# Patient Record
Sex: Male | Born: 1940
Health system: Southern US, Community
[De-identification: ages and names within clinical notes are randomized; demographics above are authoritative.]

## PROBLEM LIST (undated history)

## (undated) DIAGNOSIS — I10 Essential (primary) hypertension: Secondary | ICD-10-CM

## (undated) DIAGNOSIS — I739 Peripheral vascular disease, unspecified: Secondary | ICD-10-CM

## (undated) DIAGNOSIS — Z87442 Personal history of urinary calculi: Secondary | ICD-10-CM

## (undated) DIAGNOSIS — K648 Other hemorrhoids: Secondary | ICD-10-CM

## (undated) DIAGNOSIS — K219 Gastro-esophageal reflux disease without esophagitis: Secondary | ICD-10-CM

## (undated) DIAGNOSIS — E119 Type 2 diabetes mellitus without complications: Secondary | ICD-10-CM

## (undated) DIAGNOSIS — F1010 Alcohol abuse, uncomplicated: Secondary | ICD-10-CM

## (undated) DIAGNOSIS — E785 Hyperlipidemia, unspecified: Secondary | ICD-10-CM

## (undated) DIAGNOSIS — J449 Chronic obstructive pulmonary disease, unspecified: Secondary | ICD-10-CM

## (undated) DIAGNOSIS — I499 Cardiac arrhythmia, unspecified: Secondary | ICD-10-CM

## (undated) DIAGNOSIS — K449 Diaphragmatic hernia without obstruction or gangrene: Secondary | ICD-10-CM

## (undated) DIAGNOSIS — D518 Other vitamin B12 deficiency anemias: Principal | ICD-10-CM

## (undated) DIAGNOSIS — S32010A Wedge compression fracture of first lumbar vertebra, initial encounter for closed fracture: Secondary | ICD-10-CM

## (undated) DIAGNOSIS — K5730 Diverticulosis of large intestine without perforation or abscess without bleeding: Secondary | ICD-10-CM

## (undated) DIAGNOSIS — M545 Low back pain: Secondary | ICD-10-CM

## (undated) DIAGNOSIS — K922 Gastrointestinal hemorrhage, unspecified: Secondary | ICD-10-CM

## (undated) DIAGNOSIS — R55 Syncope and collapse: Secondary | ICD-10-CM

## (undated) DIAGNOSIS — R296 Repeated falls: Secondary | ICD-10-CM

## (undated) DIAGNOSIS — I6529 Occlusion and stenosis of unspecified carotid artery: Secondary | ICD-10-CM

## (undated) DIAGNOSIS — J189 Pneumonia, unspecified organism: Secondary | ICD-10-CM

## (undated) DIAGNOSIS — N179 Acute kidney failure, unspecified: Secondary | ICD-10-CM

## (undated) DIAGNOSIS — D649 Anemia, unspecified: Secondary | ICD-10-CM

## (undated) DIAGNOSIS — D126 Benign neoplasm of colon, unspecified: Secondary | ICD-10-CM

## (undated) DIAGNOSIS — I252 Old myocardial infarction: Secondary | ICD-10-CM

## (undated) DIAGNOSIS — I2510 Atherosclerotic heart disease of native coronary artery without angina pectoris: Secondary | ICD-10-CM

## (undated) DIAGNOSIS — R918 Other nonspecific abnormal finding of lung field: Secondary | ICD-10-CM

## (undated) DIAGNOSIS — R911 Solitary pulmonary nodule: Secondary | ICD-10-CM

## (undated) DIAGNOSIS — I4892 Unspecified atrial flutter: Secondary | ICD-10-CM

## (undated) DIAGNOSIS — N200 Calculus of kidney: Secondary | ICD-10-CM

## (undated) DIAGNOSIS — M7020 Olecranon bursitis, unspecified elbow: Secondary | ICD-10-CM

## (undated) DIAGNOSIS — J441 Chronic obstructive pulmonary disease with (acute) exacerbation: Secondary | ICD-10-CM

## (undated) DIAGNOSIS — I472 Ventricular tachycardia: Secondary | ICD-10-CM

## (undated) DIAGNOSIS — W19XXXA Unspecified fall, initial encounter: Secondary | ICD-10-CM

## (undated) DIAGNOSIS — M810 Age-related osteoporosis without current pathological fracture: Secondary | ICD-10-CM

## (undated) DIAGNOSIS — I4729 Other ventricular tachycardia: Secondary | ICD-10-CM

## (undated) DIAGNOSIS — I5032 Chronic diastolic (congestive) heart failure: Secondary | ICD-10-CM

## (undated) DIAGNOSIS — I480 Paroxysmal atrial fibrillation: Secondary | ICD-10-CM

## (undated) DIAGNOSIS — M1990 Unspecified osteoarthritis, unspecified site: Secondary | ICD-10-CM

## (undated) DIAGNOSIS — J9610 Chronic respiratory failure, unspecified whether with hypoxia or hypercapnia: Secondary | ICD-10-CM

## (undated) HISTORY — DX: Gastro-esophageal reflux disease without esophagitis: K21.9

## (undated) HISTORY — DX: Diaphragmatic hernia without obstruction or gangrene: K44.9

## (undated) HISTORY — DX: Type 2 diabetes mellitus without complications: E11.9

## (undated) HISTORY — DX: Occlusion and stenosis of unspecified carotid artery: I65.29

## (undated) HISTORY — DX: Hyperlipidemia, unspecified: E78.5

## (undated) HISTORY — DX: Chronic obstructive pulmonary disease, unspecified: J44.9

## (undated) HISTORY — PX: HEMORRHOID SURGERY: SHX153

## (undated) HISTORY — DX: Alcohol abuse, uncomplicated: F10.10

## (undated) HISTORY — DX: Anemia, unspecified: D64.9

## (undated) HISTORY — DX: Diverticulosis of large intestine without perforation or abscess without bleeding: K57.30

## (undated) HISTORY — DX: Chronic respiratory failure, unspecified whether with hypoxia or hypercapnia: J96.10

## (undated) HISTORY — DX: Paroxysmal atrial fibrillation: I48.0

## (undated) HISTORY — DX: Atherosclerotic heart disease of native coronary artery without angina pectoris: I25.10

## (undated) HISTORY — DX: Gastrointestinal hemorrhage, unspecified: K92.2

## (undated) HISTORY — PX: PTCA: SHX146

## (undated) HISTORY — DX: Repeated falls: R29.6

## (undated) HISTORY — DX: Peripheral vascular disease, unspecified: I73.9

## (undated) HISTORY — PX: THYROIDECTOMY: SHX17

## (undated) HISTORY — DX: Other hemorrhoids: K64.8

## (undated) HISTORY — DX: Calculus of kidney: N20.0

## (undated) HISTORY — PX: APPENDECTOMY: SHX54

## (undated) HISTORY — DX: Chronic diastolic (congestive) heart failure: I50.32

## (undated) HISTORY — DX: Solitary pulmonary nodule: R91.1

## (undated) HISTORY — PX: CHOLECYSTECTOMY: SHX55

## (undated) HISTORY — DX: Ventricular tachycardia: I47.2

## (undated) HISTORY — DX: Age-related osteoporosis without current pathological fracture: M81.0

## (undated) HISTORY — DX: Unspecified fall, initial encounter: W19.XXXA

## (undated) HISTORY — DX: Essential (primary) hypertension: I10

## (undated) HISTORY — DX: Unspecified atrial flutter: I48.92

## (undated) HISTORY — DX: Other ventricular tachycardia: I47.29

## (undated) HISTORY — PX: CORONARY ARTERY BYPASS GRAFT: SHX141

## (undated) HISTORY — DX: Old myocardial infarction: I25.2

## (undated) HISTORY — DX: Benign neoplasm of colon, unspecified: D12.6

## (undated) HISTORY — DX: Other vitamin B12 deficiency anemias: D51.8

## (undated) HISTORY — PX: CORONARY STENT PLACEMENT: SHX1402

---

## 1991-05-31 ENCOUNTER — Encounter: Payer: Self-pay | Admitting: Internal Medicine

## 1997-12-18 ENCOUNTER — Encounter: Payer: Self-pay | Admitting: Emergency Medicine

## 1997-12-19 ENCOUNTER — Inpatient Hospital Stay (HOSPITAL_COMMUNITY): Admission: EM | Admit: 1997-12-19 | Discharge: 1997-12-19 | Payer: Self-pay | Admitting: Emergency Medicine

## 1997-12-19 ENCOUNTER — Encounter: Payer: Self-pay | Admitting: Cardiovascular Disease

## 1998-10-07 ENCOUNTER — Ambulatory Visit (HOSPITAL_COMMUNITY): Admission: RE | Admit: 1998-10-07 | Discharge: 1998-10-08 | Payer: Self-pay | Admitting: Cardiology

## 1999-01-19 ENCOUNTER — Ambulatory Visit (HOSPITAL_COMMUNITY): Admission: RE | Admit: 1999-01-19 | Discharge: 1999-01-19 | Payer: Self-pay | Admitting: Internal Medicine

## 1999-01-19 ENCOUNTER — Encounter: Payer: Self-pay | Admitting: Internal Medicine

## 1999-01-22 ENCOUNTER — Encounter: Payer: Self-pay | Admitting: Emergency Medicine

## 1999-01-22 ENCOUNTER — Emergency Department (HOSPITAL_COMMUNITY): Admission: EM | Admit: 1999-01-22 | Discharge: 1999-01-22 | Payer: Self-pay | Admitting: Emergency Medicine

## 1999-01-27 ENCOUNTER — Encounter: Payer: Self-pay | Admitting: Internal Medicine

## 1999-01-27 ENCOUNTER — Inpatient Hospital Stay (HOSPITAL_COMMUNITY): Admission: EM | Admit: 1999-01-27 | Discharge: 1999-01-29 | Payer: Self-pay | Admitting: Emergency Medicine

## 1999-01-27 ENCOUNTER — Encounter: Payer: Self-pay | Admitting: Emergency Medicine

## 1999-01-27 ENCOUNTER — Encounter: Admission: RE | Admit: 1999-01-27 | Discharge: 1999-01-27 | Payer: Self-pay | Admitting: Internal Medicine

## 1999-05-28 ENCOUNTER — Ambulatory Visit (HOSPITAL_COMMUNITY): Admission: RE | Admit: 1999-05-28 | Discharge: 1999-05-28 | Payer: Self-pay | Admitting: Cardiology

## 2000-04-05 ENCOUNTER — Encounter: Payer: Self-pay | Admitting: Cardiology

## 2000-04-05 ENCOUNTER — Inpatient Hospital Stay (HOSPITAL_COMMUNITY): Admission: EM | Admit: 2000-04-05 | Discharge: 2000-04-08 | Payer: Self-pay | Admitting: Emergency Medicine

## 2000-04-25 ENCOUNTER — Encounter: Payer: Self-pay | Admitting: General Surgery

## 2000-04-25 ENCOUNTER — Ambulatory Visit (HOSPITAL_COMMUNITY): Admission: RE | Admit: 2000-04-25 | Discharge: 2000-04-25 | Payer: Self-pay | Admitting: General Surgery

## 2000-11-15 ENCOUNTER — Encounter: Payer: Self-pay | Admitting: Emergency Medicine

## 2000-11-15 ENCOUNTER — Inpatient Hospital Stay (HOSPITAL_COMMUNITY): Admission: EM | Admit: 2000-11-15 | Discharge: 2000-11-17 | Payer: Self-pay | Admitting: Emergency Medicine

## 2000-11-15 ENCOUNTER — Encounter (INDEPENDENT_AMBULATORY_CARE_PROVIDER_SITE_OTHER): Payer: Self-pay | Admitting: *Deleted

## 2001-03-06 ENCOUNTER — Encounter: Payer: Self-pay | Admitting: *Deleted

## 2001-03-06 ENCOUNTER — Inpatient Hospital Stay (HOSPITAL_COMMUNITY): Admission: EM | Admit: 2001-03-06 | Discharge: 2001-03-09 | Payer: Self-pay | Admitting: *Deleted

## 2002-03-28 ENCOUNTER — Encounter: Payer: Self-pay | Admitting: Internal Medicine

## 2002-03-28 LAB — CONVERTED CEMR LAB

## 2002-05-29 ENCOUNTER — Encounter: Admission: RE | Admit: 2002-05-29 | Discharge: 2002-05-29 | Payer: Self-pay | Admitting: Internal Medicine

## 2002-05-29 ENCOUNTER — Encounter: Payer: Self-pay | Admitting: Internal Medicine

## 2002-12-24 ENCOUNTER — Encounter: Payer: Self-pay | Admitting: Cardiology

## 2002-12-24 ENCOUNTER — Inpatient Hospital Stay (HOSPITAL_COMMUNITY): Admission: EM | Admit: 2002-12-24 | Discharge: 2002-12-26 | Payer: Self-pay | Admitting: *Deleted

## 2002-12-26 ENCOUNTER — Encounter: Payer: Self-pay | Admitting: Cardiology

## 2003-12-09 ENCOUNTER — Encounter: Payer: Self-pay | Admitting: Internal Medicine

## 2004-02-23 ENCOUNTER — Ambulatory Visit: Payer: Self-pay | Admitting: Internal Medicine

## 2004-03-31 ENCOUNTER — Ambulatory Visit: Payer: Self-pay | Admitting: Internal Medicine

## 2004-05-12 ENCOUNTER — Inpatient Hospital Stay (HOSPITAL_COMMUNITY): Admission: EM | Admit: 2004-05-12 | Discharge: 2004-05-13 | Payer: Self-pay | Admitting: Emergency Medicine

## 2004-05-12 ENCOUNTER — Ambulatory Visit: Payer: Self-pay | Admitting: Cardiology

## 2004-06-02 ENCOUNTER — Ambulatory Visit: Payer: Self-pay | Admitting: Cardiology

## 2004-06-23 ENCOUNTER — Ambulatory Visit: Payer: Self-pay | Admitting: Internal Medicine

## 2004-08-02 ENCOUNTER — Ambulatory Visit: Payer: Self-pay | Admitting: Internal Medicine

## 2004-10-18 ENCOUNTER — Ambulatory Visit: Payer: Self-pay | Admitting: Internal Medicine

## 2004-10-20 ENCOUNTER — Ambulatory Visit: Payer: Self-pay | Admitting: Internal Medicine

## 2005-01-17 ENCOUNTER — Ambulatory Visit: Payer: Self-pay | Admitting: Internal Medicine

## 2005-01-26 ENCOUNTER — Ambulatory Visit: Payer: Self-pay

## 2005-04-08 ENCOUNTER — Ambulatory Visit: Payer: Self-pay | Admitting: Internal Medicine

## 2005-04-15 ENCOUNTER — Ambulatory Visit: Payer: Self-pay | Admitting: Internal Medicine

## 2005-05-12 ENCOUNTER — Ambulatory Visit: Payer: Self-pay | Admitting: Internal Medicine

## 2005-09-19 ENCOUNTER — Encounter: Admission: RE | Admit: 2005-09-19 | Discharge: 2005-09-19 | Payer: Self-pay | Admitting: Internal Medicine

## 2005-09-19 ENCOUNTER — Ambulatory Visit: Payer: Self-pay | Admitting: Internal Medicine

## 2005-12-06 ENCOUNTER — Ambulatory Visit: Payer: Self-pay | Admitting: Internal Medicine

## 2005-12-14 ENCOUNTER — Encounter: Admission: RE | Admit: 2005-12-14 | Discharge: 2005-12-14 | Payer: Self-pay | Admitting: Internal Medicine

## 2005-12-21 ENCOUNTER — Ambulatory Visit: Payer: Self-pay | Admitting: Internal Medicine

## 2006-01-04 ENCOUNTER — Encounter: Admission: RE | Admit: 2006-01-04 | Discharge: 2006-01-30 | Payer: Self-pay | Admitting: Internal Medicine

## 2006-01-16 ENCOUNTER — Ambulatory Visit: Payer: Self-pay | Admitting: Internal Medicine

## 2006-01-18 ENCOUNTER — Ambulatory Visit: Payer: Self-pay | Admitting: Internal Medicine

## 2006-02-15 ENCOUNTER — Ambulatory Visit: Payer: Self-pay

## 2006-02-23 ENCOUNTER — Ambulatory Visit: Payer: Self-pay | Admitting: Cardiology

## 2006-04-03 ENCOUNTER — Ambulatory Visit: Payer: Self-pay | Admitting: Internal Medicine

## 2006-11-21 ENCOUNTER — Encounter: Payer: Self-pay | Admitting: Internal Medicine

## 2006-11-21 DIAGNOSIS — Z951 Presence of aortocoronary bypass graft: Secondary | ICD-10-CM

## 2006-11-21 DIAGNOSIS — M810 Age-related osteoporosis without current pathological fracture: Secondary | ICD-10-CM

## 2006-11-21 DIAGNOSIS — E785 Hyperlipidemia, unspecified: Secondary | ICD-10-CM | POA: Insufficient documentation

## 2006-11-21 DIAGNOSIS — I252 Old myocardial infarction: Secondary | ICD-10-CM

## 2006-11-21 DIAGNOSIS — I10 Essential (primary) hypertension: Secondary | ICD-10-CM | POA: Insufficient documentation

## 2006-11-21 HISTORY — DX: Old myocardial infarction: I25.2

## 2006-11-21 HISTORY — DX: Hyperlipidemia, unspecified: E78.5

## 2006-11-21 HISTORY — DX: Age-related osteoporosis without current pathological fracture: M81.0

## 2006-12-18 ENCOUNTER — Ambulatory Visit: Payer: Self-pay | Admitting: Internal Medicine

## 2006-12-19 ENCOUNTER — Ambulatory Visit: Payer: Self-pay | Admitting: Internal Medicine

## 2006-12-19 ENCOUNTER — Encounter: Payer: Self-pay | Admitting: Internal Medicine

## 2006-12-20 LAB — CONVERTED CEMR LAB
ALT: 14 units/L (ref 0–53)
AST: 21 units/L (ref 0–37)
Albumin: 4 g/dL (ref 3.5–5.2)
Alkaline Phosphatase: 46 units/L (ref 39–117)
BUN: 8 mg/dL (ref 6–23)
Basophils Absolute: 0 10*3/uL (ref 0.0–0.1)
Basophils Relative: 0.2 % (ref 0.0–1.0)
Bilirubin, Direct: 0.2 mg/dL (ref 0.0–0.3)
CO2: 33 meq/L — ABNORMAL HIGH (ref 19–32)
Calcium: 9.2 mg/dL (ref 8.4–10.5)
Chloride: 104 meq/L (ref 96–112)
Cholesterol: 147 mg/dL (ref 0–200)
Creatinine, Ser: 0.8 mg/dL (ref 0.4–1.5)
Eosinophils Absolute: 0.2 10*3/uL (ref 0.0–0.6)
Eosinophils Relative: 2.3 % (ref 0.0–5.0)
Ferritin: 10 ng/mL — ABNORMAL LOW (ref 22.0–322.0)
GFR calc Af Amer: 124 mL/min
GFR calc non Af Amer: 103 mL/min
Glucose, Bld: 107 mg/dL — ABNORMAL HIGH (ref 70–99)
HCT: 36 % — ABNORMAL LOW (ref 39.0–52.0)
HDL: 45.6 mg/dL (ref >39.0)
Hemoglobin: 11.9 g/dL — ABNORMAL LOW (ref 13.0–17.0)
Iron: 81 ug/dL (ref 42–165)
LDL Cholesterol: 79 mg/dL (ref 0–99)
Lymphocytes Relative: 16.1 % (ref 12.0–46.0)
MCHC: 33 g/dL (ref 30.0–36.0)
MCV: 84.6 fL (ref 78.0–100.0)
Monocytes Absolute: 1.2 10*3/uL — ABNORMAL HIGH (ref 0.2–0.7)
Monocytes Relative: 15.3 % — ABNORMAL HIGH (ref 3.0–11.0)
Neutro Abs: 5 10*3/uL (ref 1.4–7.7)
Neutrophils Relative %: 66.1 % (ref 43.0–77.0)
Platelets: 258 10*3/uL (ref 150–400)
Potassium: 4 meq/L (ref 3.5–5.1)
RBC: 4.26 M/uL (ref 4.22–5.81)
RDW: 14.8 % — ABNORMAL HIGH (ref 11.5–14.6)
Saturation Ratios: 14.1 % — ABNORMAL LOW (ref 20.0–50.0)
Sodium: 144 meq/L (ref 135–145)
TSH: 2.15 microintl units/mL (ref 0.35–5.50)
Total Bilirubin: 1.2 mg/dL (ref 0.3–1.2)
Total CHOL/HDL Ratio: 3.2
Total Protein: 7 g/dL (ref 6.0–8.3)
Transferrin: 410.2 mg/dL — ABNORMAL HIGH (ref >=212.0)
Triglycerides: 113 mg/dL (ref 0–149)
VLDL: 23 mg/dL (ref 0–40)
Vitamin B-12: 238 pg/mL (ref 211–911)
WBC: 7.6 10*3/uL (ref 4.5–10.5)

## 2006-12-27 ENCOUNTER — Encounter: Payer: Self-pay | Admitting: Internal Medicine

## 2006-12-27 ENCOUNTER — Ambulatory Visit: Payer: Self-pay

## 2006-12-29 ENCOUNTER — Telehealth: Payer: Self-pay | Admitting: Internal Medicine

## 2007-01-03 ENCOUNTER — Ambulatory Visit: Payer: Self-pay | Admitting: Internal Medicine

## 2007-01-15 ENCOUNTER — Ambulatory Visit: Payer: Self-pay | Admitting: Cardiovascular Disease

## 2007-01-15 LAB — CONVERTED CEMR LAB
BUN: 10 mg/dL (ref 6–23)
Calcium: 9.7 mg/dL (ref 8.4–10.5)
Eosinophils Absolute: 0.2 10*3/uL (ref 0.0–0.6)
Eosinophils Relative: 3.4 % (ref 0.0–5.0)
GFR calc Af Amer: 124 mL/min
GFR calc non Af Amer: 103 mL/min
Glucose, Bld: 97 mg/dL (ref 70–99)
Hemoglobin: 11.8 g/dL — ABNORMAL LOW (ref 13.0–17.0)
Lymphocytes Relative: 23.4 % (ref 12.0–46.0)
MCV: 82.9 fL (ref 78.0–100.0)
Monocytes Absolute: 0.7 10*3/uL (ref 0.2–0.7)
Monocytes Relative: 11.6 % — ABNORMAL HIGH (ref 3.0–11.0)
Neutro Abs: 3.4 10*3/uL (ref 1.4–7.7)
Platelets: 256 10*3/uL (ref 150–400)
Potassium: 4.4 meq/L (ref 3.5–5.1)

## 2007-01-17 ENCOUNTER — Ambulatory Visit (HOSPITAL_COMMUNITY): Admission: RE | Admit: 2007-01-17 | Discharge: 2007-01-17 | Payer: Self-pay | Admitting: Cardiovascular Disease

## 2007-01-19 ENCOUNTER — Ambulatory Visit: Payer: Self-pay | Admitting: Internal Medicine

## 2007-01-19 DIAGNOSIS — F17200 Nicotine dependence, unspecified, uncomplicated: Secondary | ICD-10-CM

## 2007-01-30 ENCOUNTER — Ambulatory Visit: Payer: Self-pay | Admitting: Internal Medicine

## 2007-01-31 ENCOUNTER — Encounter: Payer: Self-pay | Admitting: Internal Medicine

## 2007-01-31 ENCOUNTER — Ambulatory Visit: Payer: Self-pay | Admitting: Cardiovascular Disease

## 2007-01-31 ENCOUNTER — Ambulatory Visit: Payer: Self-pay

## 2007-02-23 ENCOUNTER — Ambulatory Visit: Payer: Self-pay | Admitting: Internal Medicine

## 2007-04-13 ENCOUNTER — Ambulatory Visit: Payer: Self-pay | Admitting: Cardiology

## 2007-04-17 ENCOUNTER — Ambulatory Visit (HOSPITAL_COMMUNITY): Admission: RE | Admit: 2007-04-17 | Discharge: 2007-04-17 | Payer: Self-pay | Admitting: Cardiovascular Disease

## 2007-04-17 ENCOUNTER — Ambulatory Visit: Payer: Self-pay | Admitting: Cardiovascular Disease

## 2007-04-30 ENCOUNTER — Ambulatory Visit: Payer: Self-pay

## 2007-05-11 ENCOUNTER — Ambulatory Visit: Payer: Self-pay | Admitting: Internal Medicine

## 2007-05-21 DIAGNOSIS — K5730 Diverticulosis of large intestine without perforation or abscess without bleeding: Secondary | ICD-10-CM

## 2007-05-21 DIAGNOSIS — K449 Diaphragmatic hernia without obstruction or gangrene: Secondary | ICD-10-CM | POA: Insufficient documentation

## 2007-05-21 DIAGNOSIS — D126 Benign neoplasm of colon, unspecified: Secondary | ICD-10-CM

## 2007-05-21 DIAGNOSIS — N200 Calculus of kidney: Secondary | ICD-10-CM

## 2007-05-21 HISTORY — DX: Diaphragmatic hernia without obstruction or gangrene: K44.9

## 2007-05-21 HISTORY — DX: Diverticulosis of large intestine without perforation or abscess without bleeding: K57.30

## 2007-05-21 HISTORY — DX: Calculus of kidney: N20.0

## 2007-05-21 HISTORY — DX: Benign neoplasm of colon, unspecified: D12.6

## 2007-05-24 ENCOUNTER — Ambulatory Visit: Payer: Self-pay | Admitting: Cardiovascular Disease

## 2007-05-31 ENCOUNTER — Ambulatory Visit: Payer: Self-pay | Admitting: Internal Medicine

## 2007-06-28 ENCOUNTER — Ambulatory Visit: Payer: Self-pay | Admitting: Internal Medicine

## 2007-06-28 ENCOUNTER — Inpatient Hospital Stay (HOSPITAL_COMMUNITY): Admission: AD | Admit: 2007-06-28 | Discharge: 2007-06-29 | Payer: Self-pay | Admitting: Internal Medicine

## 2007-06-28 ENCOUNTER — Ambulatory Visit: Payer: Self-pay | Admitting: Cardiology

## 2007-06-29 ENCOUNTER — Telehealth: Payer: Self-pay | Admitting: Internal Medicine

## 2007-11-04 ENCOUNTER — Emergency Department (HOSPITAL_COMMUNITY): Admission: EM | Admit: 2007-11-04 | Discharge: 2007-11-04 | Payer: Self-pay | Admitting: Emergency Medicine

## 2007-11-04 ENCOUNTER — Ambulatory Visit: Payer: Self-pay | Admitting: Cardiology

## 2008-01-07 ENCOUNTER — Ambulatory Visit: Payer: Self-pay | Admitting: Internal Medicine

## 2008-01-08 ENCOUNTER — Ambulatory Visit: Payer: Self-pay

## 2008-01-08 ENCOUNTER — Ambulatory Visit: Payer: Self-pay | Admitting: Cardiovascular Disease

## 2008-01-17 ENCOUNTER — Ambulatory Visit: Payer: Self-pay

## 2008-02-04 ENCOUNTER — Ambulatory Visit: Payer: Self-pay | Admitting: Internal Medicine

## 2008-02-04 LAB — CONVERTED CEMR LAB
Blood in Urine, dipstick: NEGATIVE
Glucose, Urine, Semiquant: NEGATIVE
Nitrite: NEGATIVE
Protein, U semiquant: NEGATIVE
WBC Urine, dipstick: NEGATIVE
pH: 7.5

## 2008-02-05 LAB — CONVERTED CEMR LAB
Alkaline Phosphatase: 37 units/L — ABNORMAL LOW (ref 39–117)
Bilirubin, Direct: 0.1 mg/dL (ref 0.0–0.3)
Calcium: 9.2 mg/dL (ref 8.4–10.5)
GFR calc Af Amer: 124 mL/min
GFR calc non Af Amer: 102 mL/min
HDL: 36.7 mg/dL — ABNORMAL LOW (ref >39.0)
LDL Cholesterol: 69 mg/dL (ref 0–99)
Potassium: 4 meq/L (ref 3.5–5.1)
Sodium: 140 meq/L (ref 135–145)
Total Bilirubin: 0.9 mg/dL (ref 0.3–1.2)
Total CHOL/HDL Ratio: 3.5
Total Protein: 6.9 g/dL (ref 6.0–8.3)
VLDL: 21 mg/dL (ref 0–40)

## 2008-04-04 ENCOUNTER — Ambulatory Visit: Payer: Self-pay | Admitting: Cardiology

## 2008-04-15 ENCOUNTER — Ambulatory Visit (HOSPITAL_COMMUNITY): Admission: RE | Admit: 2008-04-15 | Discharge: 2008-04-15 | Payer: Self-pay | Admitting: Cardiology

## 2008-04-15 ENCOUNTER — Ambulatory Visit: Payer: Self-pay | Admitting: Cardiology

## 2008-07-23 ENCOUNTER — Ambulatory Visit: Payer: Self-pay | Admitting: Internal Medicine

## 2008-07-24 LAB — CONVERTED CEMR LAB
ALT: 11 units/L (ref 0–53)
AST: 17 units/L (ref 0–37)
BUN: 7 mg/dL (ref 6–23)
Basophils Relative: 1 % (ref 0.0–3.0)
CO2: 32 meq/L (ref 19–32)
Chloride: 105 meq/L (ref 96–112)
Cholesterol: 142 mg/dL (ref 0–200)
Creatinine, Ser: 0.7 mg/dL (ref 0.4–1.5)
HCT: 38 % — ABNORMAL LOW (ref 39.0–52.0)
Hemoglobin: 12.7 g/dL — ABNORMAL LOW (ref 13.0–17.0)
LDL Cholesterol: 70 mg/dL (ref 0–99)
Lymphocytes Relative: 24.6 % (ref 12.0–46.0)
Lymphs Abs: 0.9 10*3/uL (ref 0.7–4.0)
MCHC: 33.3 g/dL (ref 30.0–36.0)
Monocytes Relative: 14.4 % — ABNORMAL HIGH (ref 3.0–12.0)
Neutro Abs: 2.1 10*3/uL (ref 1.4–7.7)
Potassium: 3.6 meq/L (ref 3.5–5.1)
RBC: 4.41 M/uL (ref 4.22–5.81)
Total Bilirubin: 1 mg/dL (ref 0.3–1.2)
Total CHOL/HDL Ratio: 3
Total Protein: 6.9 g/dL (ref 6.0–8.3)
Triglycerides: 149 mg/dL (ref 0.0–149.0)

## 2008-07-30 ENCOUNTER — Ambulatory Visit: Payer: Self-pay | Admitting: Internal Medicine

## 2008-07-31 ENCOUNTER — Encounter: Payer: Self-pay | Admitting: Internal Medicine

## 2008-07-31 LAB — CONVERTED CEMR LAB
Ferritin: 11.4 ng/mL — ABNORMAL LOW (ref 22.0–322.0)
Iron: 43 ug/dL (ref 42–165)
Tissue Transglutaminase Ab, IgA: 0.2 units (ref <7)
Transferrin: 385.2 mg/dL — ABNORMAL HIGH (ref 212.0–360.0)

## 2008-08-05 ENCOUNTER — Encounter: Payer: Self-pay | Admitting: Internal Medicine

## 2008-08-06 ENCOUNTER — Encounter: Payer: Self-pay | Admitting: Cardiology

## 2008-08-06 ENCOUNTER — Encounter: Payer: Self-pay | Admitting: Internal Medicine

## 2008-08-07 ENCOUNTER — Telehealth (INDEPENDENT_AMBULATORY_CARE_PROVIDER_SITE_OTHER): Payer: Self-pay | Admitting: *Deleted

## 2008-08-07 ENCOUNTER — Ambulatory Visit: Payer: Self-pay | Admitting: Internal Medicine

## 2008-08-11 ENCOUNTER — Telehealth (INDEPENDENT_AMBULATORY_CARE_PROVIDER_SITE_OTHER): Payer: Self-pay

## 2008-08-12 ENCOUNTER — Ambulatory Visit: Payer: Self-pay

## 2008-08-12 ENCOUNTER — Ambulatory Visit: Payer: Self-pay | Admitting: Cardiology

## 2008-08-14 ENCOUNTER — Ambulatory Visit: Payer: Self-pay | Admitting: Internal Medicine

## 2008-08-20 ENCOUNTER — Ambulatory Visit: Payer: Self-pay | Admitting: Internal Medicine

## 2008-08-21 ENCOUNTER — Ambulatory Visit: Payer: Self-pay | Admitting: Internal Medicine

## 2008-08-28 ENCOUNTER — Ambulatory Visit: Payer: Self-pay | Admitting: Internal Medicine

## 2008-08-29 ENCOUNTER — Encounter: Payer: Self-pay | Admitting: Internal Medicine

## 2008-08-29 ENCOUNTER — Emergency Department (HOSPITAL_COMMUNITY): Admission: EM | Admit: 2008-08-29 | Discharge: 2008-08-29 | Payer: Self-pay | Admitting: Emergency Medicine

## 2008-08-29 ENCOUNTER — Ambulatory Visit: Payer: Self-pay | Admitting: Internal Medicine

## 2008-09-02 ENCOUNTER — Encounter: Payer: Self-pay | Admitting: Internal Medicine

## 2008-09-03 ENCOUNTER — Telehealth: Payer: Self-pay | Admitting: Internal Medicine

## 2008-09-04 ENCOUNTER — Ambulatory Visit: Payer: Self-pay | Admitting: Internal Medicine

## 2008-09-06 ENCOUNTER — Ambulatory Visit: Payer: Self-pay | Admitting: Internal Medicine

## 2008-09-06 ENCOUNTER — Inpatient Hospital Stay (HOSPITAL_COMMUNITY): Admission: EM | Admit: 2008-09-06 | Discharge: 2008-09-07 | Payer: Self-pay | Admitting: Emergency Medicine

## 2008-09-09 ENCOUNTER — Ambulatory Visit: Payer: Self-pay | Admitting: Internal Medicine

## 2008-09-09 DIAGNOSIS — K922 Gastrointestinal hemorrhage, unspecified: Secondary | ICD-10-CM | POA: Insufficient documentation

## 2008-09-09 HISTORY — DX: Gastrointestinal hemorrhage, unspecified: K92.2

## 2008-09-10 ENCOUNTER — Telehealth: Payer: Self-pay | Admitting: Internal Medicine

## 2008-09-10 LAB — CONVERTED CEMR LAB
Basophils Absolute: 0 10*3/uL (ref 0.0–0.1)
Lymphocytes Relative: 20.2 % (ref 12.0–46.0)
Lymphs Abs: 1.1 10*3/uL (ref 0.7–4.0)
Monocytes Relative: 11.7 % (ref 3.0–12.0)
Neutrophils Relative %: 64.1 % (ref 43.0–77.0)
Platelets: 217 10*3/uL (ref 150.0–400.0)
RDW: 15.7 % — ABNORMAL HIGH (ref 11.5–14.6)
WBC: 5.2 10*3/uL (ref 4.5–10.5)

## 2008-09-30 ENCOUNTER — Ambulatory Visit: Payer: Self-pay | Admitting: Cardiology

## 2008-10-02 ENCOUNTER — Ambulatory Visit: Payer: Self-pay | Admitting: Internal Medicine

## 2008-10-10 ENCOUNTER — Ambulatory Visit (HOSPITAL_COMMUNITY): Admission: RE | Admit: 2008-10-10 | Discharge: 2008-10-11 | Payer: Self-pay | Admitting: Surgery

## 2008-10-10 HISTORY — PX: INGUINAL HERNIA REPAIR: SHX194

## 2008-10-21 ENCOUNTER — Ambulatory Visit: Payer: Self-pay | Admitting: Internal Medicine

## 2008-10-22 ENCOUNTER — Encounter: Payer: Self-pay | Admitting: Cardiology

## 2008-10-22 ENCOUNTER — Encounter: Payer: Self-pay | Admitting: Internal Medicine

## 2008-10-31 ENCOUNTER — Ambulatory Visit: Payer: Self-pay | Admitting: Internal Medicine

## 2008-12-02 ENCOUNTER — Ambulatory Visit: Payer: Self-pay | Admitting: Internal Medicine

## 2008-12-02 DIAGNOSIS — D518 Other vitamin B12 deficiency anemias: Secondary | ICD-10-CM

## 2008-12-02 HISTORY — DX: Other vitamin B12 deficiency anemias: D51.8

## 2008-12-17 ENCOUNTER — Ambulatory Visit: Payer: Self-pay | Admitting: Internal Medicine

## 2009-01-01 ENCOUNTER — Ambulatory Visit: Payer: Self-pay | Admitting: Internal Medicine

## 2009-01-23 ENCOUNTER — Ambulatory Visit: Payer: Self-pay | Admitting: Family Medicine

## 2009-01-23 ENCOUNTER — Encounter: Payer: Self-pay | Admitting: Internal Medicine

## 2009-01-27 ENCOUNTER — Ambulatory Visit: Payer: Self-pay | Admitting: Family Medicine

## 2009-02-02 ENCOUNTER — Ambulatory Visit: Payer: Self-pay | Admitting: Internal Medicine

## 2009-02-11 ENCOUNTER — Ambulatory Visit: Payer: Self-pay | Admitting: Internal Medicine

## 2009-02-12 ENCOUNTER — Telehealth: Payer: Self-pay | Admitting: Internal Medicine

## 2009-02-17 ENCOUNTER — Encounter (INDEPENDENT_AMBULATORY_CARE_PROVIDER_SITE_OTHER): Payer: Self-pay | Admitting: *Deleted

## 2009-02-17 ENCOUNTER — Encounter: Payer: Self-pay | Admitting: Internal Medicine

## 2009-02-24 ENCOUNTER — Encounter (INDEPENDENT_AMBULATORY_CARE_PROVIDER_SITE_OTHER): Payer: Self-pay | Admitting: *Deleted

## 2009-02-25 ENCOUNTER — Encounter: Payer: Self-pay | Admitting: Internal Medicine

## 2009-03-05 ENCOUNTER — Ambulatory Visit: Payer: Self-pay | Admitting: Internal Medicine

## 2009-03-28 HISTORY — PX: CORONARY ARTERY BYPASS GRAFT: SHX141

## 2009-03-30 ENCOUNTER — Encounter: Payer: Self-pay | Admitting: Cardiology

## 2009-03-30 ENCOUNTER — Encounter (INDEPENDENT_AMBULATORY_CARE_PROVIDER_SITE_OTHER): Payer: Self-pay | Admitting: *Deleted

## 2009-03-30 ENCOUNTER — Ambulatory Visit: Payer: Self-pay | Admitting: Cardiology

## 2009-03-30 LAB — CONVERTED CEMR LAB
Basophils Relative: 0.1 % (ref 0.0–3.0)
CO2: 31 meq/L (ref 19–32)
Calcium: 10 mg/dL (ref 8.4–10.5)
Chloride: 101 meq/L (ref 96–112)
Eosinophils Absolute: 0.2 10*3/uL (ref 0.0–0.7)
HCT: 34.5 % — ABNORMAL LOW (ref 39.0–52.0)
Hemoglobin: 10.9 g/dL — ABNORMAL LOW (ref 13.0–17.0)
Lymphocytes Relative: 13.1 % (ref 12.0–46.0)
MCHC: 31.5 g/dL (ref 30.0–36.0)
Neutro Abs: 6.3 10*3/uL (ref 1.4–7.7)
Potassium: 4 meq/L (ref 3.5–5.1)
RBC: 4.3 M/uL (ref 4.22–5.81)
Sodium: 138 meq/L (ref 135–145)
aPTT: 29.6 s — ABNORMAL HIGH (ref 21.7–28.8)

## 2009-04-01 ENCOUNTER — Ambulatory Visit: Payer: Self-pay | Admitting: Cardiology

## 2009-04-01 ENCOUNTER — Inpatient Hospital Stay (HOSPITAL_BASED_OUTPATIENT_CLINIC_OR_DEPARTMENT_OTHER): Admission: RE | Admit: 2009-04-01 | Discharge: 2009-04-01 | Payer: Self-pay | Admitting: Cardiology

## 2009-04-06 ENCOUNTER — Telehealth (INDEPENDENT_AMBULATORY_CARE_PROVIDER_SITE_OTHER): Payer: Self-pay | Admitting: *Deleted

## 2009-04-06 ENCOUNTER — Ambulatory Visit: Payer: Self-pay | Admitting: Internal Medicine

## 2009-04-09 ENCOUNTER — Telehealth (INDEPENDENT_AMBULATORY_CARE_PROVIDER_SITE_OTHER): Payer: Self-pay | Admitting: *Deleted

## 2009-04-10 ENCOUNTER — Encounter: Payer: Self-pay | Admitting: Cardiology

## 2009-04-13 ENCOUNTER — Ambulatory Visit: Payer: Self-pay

## 2009-04-13 ENCOUNTER — Ambulatory Visit: Payer: Self-pay | Admitting: Cardiology

## 2009-04-13 ENCOUNTER — Encounter (HOSPITAL_COMMUNITY): Admission: RE | Admit: 2009-04-13 | Discharge: 2009-07-03 | Payer: Self-pay | Admitting: Cardiology

## 2009-04-16 ENCOUNTER — Encounter: Payer: Self-pay | Admitting: Cardiovascular Disease

## 2009-04-16 DIAGNOSIS — I6529 Occlusion and stenosis of unspecified carotid artery: Secondary | ICD-10-CM

## 2009-04-16 HISTORY — DX: Occlusion and stenosis of unspecified carotid artery: I65.29

## 2009-04-17 ENCOUNTER — Ambulatory Visit: Payer: Self-pay

## 2009-04-17 ENCOUNTER — Encounter: Payer: Self-pay | Admitting: Cardiovascular Disease

## 2009-04-21 ENCOUNTER — Encounter: Payer: Self-pay | Admitting: Cardiovascular Disease

## 2009-04-22 ENCOUNTER — Ambulatory Visit: Payer: Self-pay | Admitting: Cardiology

## 2009-05-05 ENCOUNTER — Telehealth: Payer: Self-pay | Admitting: Internal Medicine

## 2009-05-07 ENCOUNTER — Ambulatory Visit: Payer: Self-pay | Admitting: Internal Medicine

## 2009-05-21 ENCOUNTER — Encounter: Payer: Self-pay | Admitting: Internal Medicine

## 2009-06-03 ENCOUNTER — Ambulatory Visit: Payer: Self-pay | Admitting: Internal Medicine

## 2009-06-23 ENCOUNTER — Ambulatory Visit: Payer: Self-pay | Admitting: Cardiology

## 2009-06-26 ENCOUNTER — Telehealth: Payer: Self-pay | Admitting: Cardiology

## 2009-06-26 LAB — CONVERTED CEMR LAB
AST: 15 units/L (ref 0–37)
Albumin: 3.8 g/dL (ref 3.5–5.2)
Alkaline Phosphatase: 40 units/L (ref 39–117)
LDL Cholesterol: 64 mg/dL (ref 0–99)
Total Bilirubin: 0.7 mg/dL (ref 0.3–1.2)
VLDL: 20.8 mg/dL (ref 0.0–40.0)

## 2009-06-29 ENCOUNTER — Ambulatory Visit: Payer: Self-pay | Admitting: Internal Medicine

## 2009-07-07 ENCOUNTER — Telehealth: Payer: Self-pay | Admitting: Cardiology

## 2009-07-30 ENCOUNTER — Ambulatory Visit: Payer: Self-pay | Admitting: Internal Medicine

## 2009-08-04 ENCOUNTER — Ambulatory Visit: Payer: Self-pay | Admitting: Cardiology

## 2009-08-04 ENCOUNTER — Encounter: Payer: Self-pay | Admitting: Cardiology

## 2009-08-04 ENCOUNTER — Encounter: Payer: Self-pay | Admitting: Internal Medicine

## 2009-08-06 LAB — CONVERTED CEMR LAB
Eosinophils Relative: 3.2 % (ref 0.0–5.0)
GFR calc non Af Amer: 103.27 mL/min (ref >60)
HCT: 33 % — ABNORMAL LOW (ref 39.0–52.0)
Hemoglobin: 10.7 g/dL — ABNORMAL LOW (ref 13.0–17.0)
Lymphs Abs: 1.3 10*3/uL (ref 0.7–4.0)
Monocytes Relative: 13.7 % — ABNORMAL HIGH (ref 3.0–12.0)
Neutro Abs: 3.1 10*3/uL (ref 1.4–7.7)
Potassium: 4.6 meq/L (ref 3.5–5.1)
RBC: 4.13 M/uL — ABNORMAL LOW (ref 4.22–5.81)
Sodium: 142 meq/L (ref 135–145)
WBC: 5.4 10*3/uL (ref 4.5–10.5)

## 2009-08-11 ENCOUNTER — Ambulatory Visit (HOSPITAL_COMMUNITY): Admission: RE | Admit: 2009-08-11 | Discharge: 2009-08-11 | Payer: Self-pay | Admitting: Cardiology

## 2009-08-11 ENCOUNTER — Ambulatory Visit: Payer: Self-pay | Admitting: Cardiology

## 2009-08-17 ENCOUNTER — Ambulatory Visit (HOSPITAL_COMMUNITY): Admission: RE | Admit: 2009-08-17 | Discharge: 2009-08-17 | Payer: Self-pay | Admitting: Cardiology

## 2009-08-31 ENCOUNTER — Ambulatory Visit: Payer: Self-pay | Admitting: Internal Medicine

## 2009-09-02 ENCOUNTER — Ambulatory Visit: Payer: Self-pay | Admitting: Cardiology

## 2009-09-02 DIAGNOSIS — I739 Peripheral vascular disease, unspecified: Secondary | ICD-10-CM

## 2009-09-04 ENCOUNTER — Telehealth: Payer: Self-pay | Admitting: Cardiology

## 2009-09-23 ENCOUNTER — Ambulatory Visit: Payer: Self-pay | Admitting: Surgery

## 2009-09-23 ENCOUNTER — Encounter: Payer: Self-pay | Admitting: Cardiology

## 2009-10-01 ENCOUNTER — Ambulatory Visit: Payer: Self-pay | Admitting: Internal Medicine

## 2009-10-01 ENCOUNTER — Telehealth: Payer: Self-pay | Admitting: Internal Medicine

## 2009-10-04 ENCOUNTER — Emergency Department (HOSPITAL_COMMUNITY): Admission: EM | Admit: 2009-10-04 | Discharge: 2009-10-04 | Payer: Self-pay | Admitting: Emergency Medicine

## 2009-10-05 ENCOUNTER — Telehealth: Payer: Self-pay | Admitting: Internal Medicine

## 2009-10-05 ENCOUNTER — Ambulatory Visit: Payer: Self-pay | Admitting: Internal Medicine

## 2009-10-08 ENCOUNTER — Encounter: Payer: Self-pay | Admitting: Surgery

## 2009-10-12 ENCOUNTER — Inpatient Hospital Stay (HOSPITAL_COMMUNITY): Admission: RE | Admit: 2009-10-12 | Discharge: 2009-10-22 | Payer: Self-pay | Admitting: Surgery

## 2009-10-12 ENCOUNTER — Ambulatory Visit: Payer: Self-pay | Admitting: Surgery

## 2009-10-12 ENCOUNTER — Ambulatory Visit: Payer: Self-pay | Admitting: Cardiology

## 2009-10-27 ENCOUNTER — Ambulatory Visit: Payer: Self-pay | Admitting: Surgery

## 2009-11-02 ENCOUNTER — Ambulatory Visit: Payer: Self-pay | Admitting: Internal Medicine

## 2009-11-06 ENCOUNTER — Ambulatory Visit: Payer: Self-pay | Admitting: Internal Medicine

## 2009-11-06 DIAGNOSIS — I4891 Unspecified atrial fibrillation: Secondary | ICD-10-CM | POA: Insufficient documentation

## 2009-11-16 ENCOUNTER — Encounter: Payer: Self-pay | Admitting: Cardiology

## 2009-11-16 ENCOUNTER — Encounter: Admission: RE | Admit: 2009-11-16 | Discharge: 2009-11-16 | Payer: Self-pay | Admitting: Surgery

## 2009-11-16 ENCOUNTER — Ambulatory Visit: Payer: Self-pay | Admitting: Surgery

## 2009-11-16 ENCOUNTER — Telehealth: Payer: Self-pay | Admitting: Cardiology

## 2009-11-17 ENCOUNTER — Telehealth: Payer: Self-pay | Admitting: Cardiology

## 2009-12-04 ENCOUNTER — Ambulatory Visit: Payer: Self-pay | Admitting: Internal Medicine

## 2009-12-07 ENCOUNTER — Ambulatory Visit: Payer: Self-pay | Admitting: Cardiology

## 2009-12-07 LAB — CONVERTED CEMR LAB
Albumin: 3.9 g/dL (ref 3.5–5.2)
Alkaline Phosphatase: 62 units/L (ref 39–117)
Basophils Relative: 0.4 % (ref 0.0–3.0)
CO2: 33 meq/L — ABNORMAL HIGH (ref 19–32)
Chloride: 99 meq/L (ref 96–112)
Creatinine, Ser: 0.8 mg/dL (ref 0.4–1.5)
Eosinophils Absolute: 0.3 10*3/uL (ref 0.0–0.7)
Hemoglobin: 14.3 g/dL (ref 13.0–17.0)
LDL Cholesterol: 80 mg/dL (ref 0–99)
Lymphocytes Relative: 16.7 % (ref 12.0–46.0)
MCHC: 32.7 g/dL (ref 30.0–36.0)
MCV: 89.7 fL (ref 78.0–100.0)
Neutro Abs: 6.1 10*3/uL (ref 1.4–7.7)
Potassium: 4.2 meq/L (ref 3.5–5.1)
RBC: 4.86 M/uL (ref 4.22–5.81)
Sodium: 141 meq/L (ref 135–145)
Total CHOL/HDL Ratio: 4
Triglycerides: 101 mg/dL (ref 0.0–149.0)
VLDL: 20.2 mg/dL (ref 0.0–40.0)

## 2010-01-05 ENCOUNTER — Ambulatory Visit: Payer: Self-pay | Admitting: Internal Medicine

## 2010-01-26 ENCOUNTER — Encounter: Payer: Self-pay | Admitting: Cardiology

## 2010-01-27 ENCOUNTER — Encounter: Payer: Self-pay | Admitting: Cardiology

## 2010-01-27 ENCOUNTER — Ambulatory Visit: Payer: Self-pay | Admitting: Cardiology

## 2010-01-28 ENCOUNTER — Ambulatory Visit: Payer: Self-pay | Admitting: Cardiology

## 2010-01-28 ENCOUNTER — Ambulatory Visit: Payer: Self-pay | Admitting: Internal Medicine

## 2010-01-28 LAB — CONVERTED CEMR LAB
BUN: 15 mg/dL (ref 6–23)
Basophils Absolute: 0 10*3/uL (ref 0.0–0.1)
CO2: 30 meq/L (ref 19–32)
Chloride: 106 meq/L (ref 96–112)
Glucose, Bld: 71 mg/dL (ref 70–99)
HCT: 45.5 % (ref 39.0–52.0)
Hemoglobin: 15.2 g/dL (ref 13.0–17.0)
Lymphs Abs: 1.5 10*3/uL (ref 0.7–4.0)
MCHC: 33.5 g/dL (ref 30.0–36.0)
MCV: 90.7 fL (ref 78.0–100.0)
Monocytes Absolute: 0.9 10*3/uL (ref 0.1–1.0)
Neutro Abs: 5.8 10*3/uL (ref 1.4–7.7)
Platelets: 241 10*3/uL (ref 150.0–400.0)
Potassium: 4.6 meq/L (ref 3.5–5.1)
Pro B Natriuretic peptide (BNP): 47.3 pg/mL (ref 0.0–100.0)
RDW: 16 % — ABNORMAL HIGH (ref 11.5–14.6)

## 2010-01-29 ENCOUNTER — Encounter: Payer: Self-pay | Admitting: Cardiology

## 2010-02-01 ENCOUNTER — Telehealth (INDEPENDENT_AMBULATORY_CARE_PROVIDER_SITE_OTHER): Payer: Self-pay

## 2010-02-02 ENCOUNTER — Encounter: Payer: Self-pay | Admitting: Cardiology

## 2010-02-02 ENCOUNTER — Encounter (HOSPITAL_COMMUNITY)
Admission: RE | Admit: 2010-02-02 | Discharge: 2010-03-27 | Payer: Self-pay | Source: Home / Self Care | Attending: Cardiology | Admitting: Cardiology

## 2010-02-02 ENCOUNTER — Ambulatory Visit: Payer: Self-pay | Admitting: Cardiology

## 2010-02-02 ENCOUNTER — Ambulatory Visit: Payer: Self-pay

## 2010-02-05 ENCOUNTER — Ambulatory Visit: Payer: Self-pay | Admitting: Internal Medicine

## 2010-02-08 LAB — CONVERTED CEMR LAB
BUN: 13 mg/dL (ref 6–23)
Creatinine, Ser: 1 mg/dL (ref 0.4–1.5)
GFR calc non Af Amer: 79.48 mL/min (ref >60)
Glucose, Bld: 132 mg/dL — ABNORMAL HIGH (ref 70–99)

## 2010-02-15 ENCOUNTER — Ambulatory Visit: Payer: Self-pay | Admitting: Cardiology

## 2010-02-16 ENCOUNTER — Ambulatory Visit: Payer: Self-pay | Admitting: Internal Medicine

## 2010-02-16 ENCOUNTER — Telehealth: Payer: Self-pay | Admitting: Cardiology

## 2010-02-17 ENCOUNTER — Ambulatory Visit: Payer: Self-pay | Admitting: Cardiology

## 2010-02-25 ENCOUNTER — Ambulatory Visit: Payer: Self-pay | Admitting: Internal Medicine

## 2010-02-25 ENCOUNTER — Encounter: Payer: Self-pay | Admitting: Internal Medicine

## 2010-03-05 ENCOUNTER — Ambulatory Visit: Payer: Self-pay | Admitting: Internal Medicine

## 2010-03-11 ENCOUNTER — Ambulatory Visit: Payer: Self-pay | Admitting: Internal Medicine

## 2010-03-15 ENCOUNTER — Telehealth (INDEPENDENT_AMBULATORY_CARE_PROVIDER_SITE_OTHER): Payer: Self-pay | Admitting: *Deleted

## 2010-04-01 ENCOUNTER — Telehealth: Payer: Self-pay | Admitting: Internal Medicine

## 2010-04-05 ENCOUNTER — Telehealth: Payer: Self-pay | Admitting: Internal Medicine

## 2010-04-08 ENCOUNTER — Ambulatory Visit
Admission: RE | Admit: 2010-04-08 | Discharge: 2010-04-08 | Payer: Self-pay | Source: Home / Self Care | Attending: Internal Medicine | Admitting: Internal Medicine

## 2010-04-19 ENCOUNTER — Ambulatory Visit
Admission: RE | Admit: 2010-04-19 | Discharge: 2010-04-19 | Payer: Self-pay | Source: Home / Self Care | Attending: Internal Medicine | Admitting: Internal Medicine

## 2010-04-23 ENCOUNTER — Ambulatory Visit
Admission: RE | Admit: 2010-04-23 | Discharge: 2010-04-23 | Payer: Self-pay | Source: Home / Self Care | Attending: Internal Medicine | Admitting: Internal Medicine

## 2010-04-29 NOTE — Assessment & Plan Note (Signed)
Summary: B-12INJ/RCD   Nurse Visit   Allergies: 1)  ! Ticlid  Medication Administration  Injection # 1:    Medication: Vit B12 1000 mcg    Diagnosis: ANEMIA, B12 DEFICIENCY (ICD-281.1)    Route: SQ    Site: R deltoid    Exp Date: 09/27/2011    Lot #: [id number]    Mfr: American Regent    Given by: Lynann Beaver CMA (January 05, 2010 8:31 AM)  Orders Added: 1)  Vit B12 1000 mcg [J3420] 2)  Admin of Therapeutic Inj  intramuscular or subcutaneous [21308]

## 2010-04-29 NOTE — Assessment & Plan Note (Signed)
Summary: B-12/RCD   Nurse Visit   Allergies: 1)  ! Ticlid  Medication Administration  Injection # 1:    Medication: Vit B12 1000 mcg    Diagnosis: ANEMIA, B12 DEFICIENCY (ICD-281.1)    Route: IM    Site: R deltoid    Exp Date: 09/12    Lot #: [id number]    Mfr: American Regent    Patient tolerated injection without complications    Given by: Raechel Ache, RN (August 31, 2009 9:27 AM)  Orders Added: 1)  Vit B12 1000 mcg [J3420] 2)  Admin of Therapeutic Inj  intramuscular or subcutaneous [40981]

## 2010-04-29 NOTE — Assessment & Plan Note (Signed)
Summary: rov      Allergies Added:   Visit Type:  Follow-up Primary Provider:  Birdie Sons MD  CC:  soreness.  History of Present Illness: Tpatient is 70 years old and returns for management of CAD after his recent bypass surgery. He has had multiple PCI procedures. He developed progressive disease and underwent bypass surgery on October 12, 2009 by Dr. Lavinia Sharps. His postop course was complicated by expiratory infection and paroxysmal atrial fibrillation. He had been a smoker prior to his bypass surgery. He was discharged on amiodarone and Coumadin but Coumadin was stopped fairly soon.  Since his surgery is done fairly well. He feels like his breathing is back to normal. He's had no chest pain. He still is sore in the chest.  His other problems include hypertension, hyperlipidemia, and peripheral vascular disease with previous stents to the iliac.  Current Medications (verified): 1)  Crestor 20 Mg Tabs (Rosuvastatin Calcium) .... 1/2 Every Bedtime 2)  Pantoprazole Sodium 40 Mg Tbec (Pantoprazole Sodium) .... Take 1 Tablet By Mouth Once A Day 3)  Caltrate 600+d 600-400 Mg-Unit  Tabs (Calcium Carbonate-Vitamin D) .Marland Kitchen.. 1 Tab Once Daily 4)  Aspirin 81 Mg  Tabs (Aspirin) 5)  Norvasc 10 Mg  Tabs (Amlodipine Besylate) .... Take 1 Tablet By Mouth Once A Day 6)  Spiriva Handihaler 18 Mcg  Caps (Tiotropium Bromide Monohydrate) .... One Inhaled Daily 7)  Ventolin Hfa 108 (90 Base) Mcg/act Aers (Albuterol Sulfate) .... 2 Inh Q4h As Needed Shortness of Breath 8)  Cyanocobalamin 1000 Mcg/ml Inj Soln (Cyanocobalamin) .... Inject 1 Cc Intramuscularly Monthly 9)  Forteo 600 Mcg/2.18ml Soln (Teriparatide (Recombinant)) .... Inject Daily As Directed 10)  Pen Needles 5/16" 31g X 8 Mm Misc (Insulin Pen Needle) .... Use Daily As Directed With Forteo 11)  Amiodarone Hcl 200 Mg Tabs (Amiodarone Hcl) .Marland Kitchen.. 1 By Mouth Two Times A Day 12)  Ferrous Gluconate 324 Mg Tabs (Ferrous Gluconate) .Marland Kitchen.. 1 By Mouth Two  Times A Day 13)  Benadryl 25 Mg Tabs (Diphenhydramine Hcl) .Marland Kitchen.. 1 By Mouth Q 6 Hours As Needed Itching 14)  Altace 10 Mg Caps (Ramipril) .Marland Kitchen.. 1 By Mouth Once Daily  Allergies (verified): 1)  ! Ticlid  Past History:  Past Medical History: Reviewed history from 11/06/2009 and no changes required. Allergic rhinitis Osteoporosis 1. Coronary artery disease status post multiple percutaneous coronary     inventions with nonobstructive disease at last catheterization. 2. Peripheral vascular status post previous stenting of the totally     occluded iliac and status post stenting of an external iliac in     January 2009 with recurrent symptoms of questionable claudication. 3. Hypertension. 4. Hyperlipidemia. 5. History of amaurosis fugax 1998. 6. Gastroesophageal reflux disease.  anemia, B12 deficiency AFIB--post CABG---amiodarone Atrial fibrillation  Review of Systems       ROS is negative except as outlined in HPI.   Vital Signs:  Patient profile:   70 year old male Height:      65 inches Weight:      162 pounds Pulse rate:   78 / minute BP sitting:   124 / 79  (left arm) Cuff size:   regular  Vitals Entered By: Burnett Kanaris, CNA (December 07, 2009 1:22 PM)  Physical Exam  Additional Exam:  Gen. Well-nourished, in no distress   Neck: No JVD, thyroid not enlarged, no carotid bruits Lungs: No tachypnea, clear without rales, rhonchi or wheezes Cardiovascular: Rhythm regular, PMI not displaced,  heart sounds  normal, no murmurs or gallops, no peripheral edema, pulses normal in all 4 extremities. Abdomen: BS normal, abdomen soft and non-tender without masses or organomegaly, no hepatosplenomegaly. MS: No deformities, no cyanosis or clubbing   Neuro:  No focal sns   Skin:  no lesions    Impression & Recommendations:  Problem # 1:  CORONARY ARTERY DISEASE (ICD-414.00)  He is status post recent bypass surgery. He's had no chest pain. This appears stable. His updated  medication list for this problem includes:    Aspirin 81 Mg Tabs (Aspirin)    Norvasc 10 Mg Tabs (Amlodipine besylate) .Marland Kitchen... Take 1 tablet by mouth once a day    Altace 10 Mg Caps (Ramipril) .Marland Kitchen... 1 by mouth once daily  His updated medication list for this problem includes:    Aspirin 81 Mg Tabs (Aspirin)    Norvasc 10 Mg Tabs (Amlodipine besylate) .Marland Kitchen... Take 1 tablet by mouth once a day    Altace 10 Mg Caps (Ramipril) .Marland Kitchen... 1 by mouth once daily  His updated medication list for this problem includes:    Aspirin 81 Mg Tabs (Aspirin)    Norvasc 10 Mg Tabs (Amlodipine besylate) .Marland Kitchen... Take 1 tablet by mouth once a day    Altace 10 Mg Caps (Ramipril) .Marland Kitchen... 1 by mouth once daily  Problem # 2:  ATRIAL FIBRILLATION (ICD-427.31)  He had atrial fibrillation postoperatively. He's had no symptomatic recurrence and is in sinus rhythm today. He is not on Coumadin. We will stop the amiodarone. This appears stable. His updated medication list for this problem includes:    Aspirin 81 Mg Tabs (Aspirin)    Amiodarone Hcl 200 Mg Tabs (Amiodarone hcl) .Marland Kitchen... 1 by mouth two times a day    His updated medication list for this problem includes:    Aspirin 81 Mg Tabs (Aspirin)    Amiodarone Hcl 200 Mg Tabs (Amiodarone hcl) .Marland Kitchen... 1 by mouth two times a day  Orders: EKG w/ Interpretation (93000)  Problem # 3:  COPD (ICD-496)  He was a smoker prior to surgery and postoperatively had pulmonary infection and bronchitis. This is now improving he feels his pulmonary status is about back to baseline. His updated medication list for this problem includes:    Spiriva Handihaler 18 Mcg Caps (Tiotropium bromide monohydrate) ..... One inhaled daily    Ventolin Hfa 108 (90 Base) Mcg/act Aers (Albuterol sulfate) .Marland Kitchen... 2 inh q4h as needed shortness of breath  His updated medication list for this problem includes:    Spiriva Handihaler 18 Mcg Caps (Tiotropium bromide monohydrate) ..... One inhaled daily    Ventolin  Hfa 108 (90 Base) Mcg/act Aers (Albuterol sulfate) .Marland Kitchen... 2 inh q4h as needed shortness of breath  His updated medication list for this problem includes:    Spiriva Handihaler 18 Mcg Caps (Tiotropium bromide monohydrate) ..... One inhaled daily    Ventolin Hfa 108 (90 Base) Mcg/act Aers (Albuterol sulfate) .Marland Kitchen... 2 inh q4h as needed shortness of breath  Problem # 4:  HYPERLIPIDEMIA (ICD-272.4)  He had a recent lipid profile which was good except for a low HDL of 34. His updated medication list for this problem includes:    Crestor 20 Mg Tabs (Rosuvastatin calcium) .Marland Kitchen... 1/2 every bedtime  His updated medication list for this problem includes:    Crestor 20 Mg Tabs (Rosuvastatin calcium) .Marland Kitchen... 1/2 every bedtime  His updated medication list for this problem includes:    Crestor 20 Mg Tabs (Rosuvastatin calcium) .Marland Kitchen... 1/2 every bedtime  Patient Instructions: 1)  Your physician recommends that you schedule a follow-up appointment in: 2 MONTHS WITH DR Juanda Chance 2)  Your physician has recommended you make the following change in your medication:  3)  STOP AMIODARONE 4)  STOP  IRON SUPPLEMENT

## 2010-04-29 NOTE — Assessment & Plan Note (Signed)
Summary: f6w  Medications Added IMDUR 60 MG XR24H-TAB (ISOSORBIDE MONONITRATE) take 1 tablet daily after lunch      Allergies Added:   Primary Provider:  Birdie Sons MD  CC:  shortness of breath, chest pain to neck relieved by 2 sl NTG on 08/02/09, and feeling full around abdomen.  History of Present Illness: The patient is 70 years old and return for management of CAD. He has had multiple PCI procedures. His last catheterization was in January 2011 at which time he had 80% narrowing in a ramus branch of the circumflex artery and medical therapy was recommended. Recently has been having increased symptoms of chest pain and we started him on Ranexa. He developed diarrhea and nausea and had to stop this. Since that time he has had 3 more episodes of chest pain which have come on without any precipitating cause. One of these required 2 nitroglycerin for relief.  His other problems include hypertension, hyperlipidemia, GERD, and peripheral vascular disease status post previous iliac stent.  Current Medications (verified): 1)  Crestor 20 Mg Tabs (Rosuvastatin Calcium) .... 1/2 Every Bedtime 2)  Pantoprazole Sodium 40 Mg Tbec (Pantoprazole Sodium) .... Take 1 Tablet By Mouth Two Times A Day 3)  Tenormin 50 Mg Tabs (Atenolol) .... Take 2 Tablet By Mouth Daily As Directed By Physcian 4)  Triamcinolone Acetonide 0.025 % Oint (Triamcinolone Acetonide) .... Apply 1 A Small Amount To Affected Area Twice A Day 5)  Caltrate 600+d 600-400 Mg-Unit  Tabs (Calcium Carbonate-Vitamin D) .Marland Kitchen.. 1 Tab Once Daily 6)  [facility]vs Vitamin E 800 Unit  Caps (Vitamin E) .... Once Daily 7)  Aspirin 81 Mg  Tbec (Aspirin) .... Once Daily 8)  Norvasc 10 Mg  Tabs (Amlodipine Besylate) .... Take 1 Tablet By Mouth Once A Day 9)  Altace 10 Mg  Tabs (Ramipril) .... Take 1 Tablet By Mouth Once A Day 10)  Plavix 75 Mg  Tabs (Clopidogrel Bisulfate) .... Take 1 Tablet By Mouth Once A Day 11)  Spiriva Handihaler 18 Mcg  Caps (Tiotropium  Bromide Monohydrate) .... One Inhaled Daily 12)  Ventolin Hfa 108 (90 Base) Mcg/act Aers (Albuterol Sulfate) .... 2 Inh Q4h As Needed Shortness of Breath 13)  Vitamin B12 Shot .Marland Kitchen.. 1 Shot Monthly 14)  Oxycodone-Acetaminophen 5-500 Mg Caps (Oxycodone-Acetaminophen) .... Take 1-2 Every 4 Hours As Needed 15)  Forteo 600 Mcg/2.46ml Soln (Teriparatide (Recombinant)) .... Inject Daily As Directed 16)  Pen Needles 5/16" 31g X 8 Mm Misc (Insulin Pen Needle) .... Use Daily As Directed With Forteo 17)  Imdur 30 Mg Xr24h-Tab (Isosorbide Mononitrate) .... Take One Daily 18)  Dicyclomine Hcl 20 Mg Tabs (Dicyclomine Hcl) .... Take One Daily  Allergies (verified): 1)  ! Ticlid  Past History:  Past Medical History: Reviewed history from 12/02/2008 and no changes required. Allergic rhinitis Osteoporosis 1. Coronary artery disease status post multiple percutaneous coronary     inventions with nonobstructive disease at last catheterization. 2. Peripheral vascular status post previous stenting of the totally     occluded iliac and status post stenting of an external iliac in     January 2009 with recurrent symptoms of questionable claudication. 3. Hypertension. 4. Hyperlipidemia. 5. History of amaurosis fugax 1998. 6. Gastroesophageal reflux disease.  anemia, B12 deficiency  Review of Systems       ROS is negative except as outlined in HPI.   Vital Signs:  Patient profile:   70 year old male Height:      65 inches  Weight:      166 pounds BMI:     27.72 Pulse rate:   67 / minute BP sitting:   126 / 68  (left arm) Cuff size:   regular  Physical Exam  Additional Exam:  Gen. Well-nourished, in no distress   Neck: No JVD, thyroid not enlarged, no carotid bruits Lungs: No tachypnea, clear without rales, rhonchi or wheezes Cardiovascular: Rhythm regular, PMI not displaced,  heart sounds  normal, grade 2/6 systolic murmur at the left sternal edge, no peripheral edema, pulses normal in all 4  extremities. Abdomen: BS normal, abdomen soft and non-tender without masses or organomegaly, no hepatosplenomegaly. MS: No deformities, no cyanosis or clubbing   Neuro:  No focal sns   Skin:  no lesions    Impression & Recommendations:  Problem # 1:  CORONARY ARTERY DISEASE (ICD-414.00)  He has CAD and has had multiple PCI procedures. He is now having increasing angina which has not responded well to medical therapy. I reviewed his angiogram again today in the office and he is quite a bit of plaque in all 3 of his coronary vessels the circumflex and LAD appeared to have the most plaque. We will plan to evaluate him further with coronary angiography and we will schedule this next week. With his high plaque burden unless we find a clear culprit angiography, bypass surgery may be the best option for him. In the meantime we'll increase his Imdur from 30-60 mg daily and have him take it in the early afternoon when he is having most of his angina.  Orders: TLB-BMP (Basic Metabolic Panel-BMET) (80048-METABOL) TLB-CBC Platelet - w/Differential (85025-CBCD) TLB-PT (Protime) (85610-PTP) T-2 View CXR (71020TC)  Problem # 2:  HYPERTENSION (ICD-401.9) Assessment: Deteriorated This is well controlled on current medications. His updated medication list for this problem includes:    Tenormin 50 Mg Tabs (Atenolol) .Marland Kitchen... Take 2 tablet by mouth daily as directed by physcian    Aspirin 81 Mg Tbec (Aspirin) ..... Once daily    Norvasc 10 Mg Tabs (Amlodipine besylate) .Marland Kitchen... Take 1 tablet by mouth once a day    Altace 10 Mg Tabs (Ramipril) .Marland Kitchen... Take 1 tablet by mouth once a day  Problem # 3:  HYPERLIPIDEMIA (ICD-272.4) This is controlled on Crestor. His updated medication list for this problem includes:    Crestor 20 Mg Tabs (Rosuvastatin calcium) .Marland Kitchen... 1/2 every bedtime  Problem # 4:  TOBACCO USE (ICD-305.1) He is still smoking anywhere from one cigarette to a half a pack of cigarettes a day. He is  trying to stop. I encouraged him to continue to work on this.  Other Orders: EKG w/ Interpretation (93000) Cardiac Catheterization (Cardiac Cath)  Patient Instructions: 1)  Your physician recommends that you schedule a follow-up appointment in: 3 weeks 2)  Your physician has recommended you make the following change in your medication: Increase Imdur to 60 mg daily after lunch 3)  Your physician recommends that you have lab work   today: cbc,bmet,pt 4)  A chest x-ray takes a picture of the organs and structures inside the chest, including the heart, lungs, and blood vessels. This test can show several things, including, whether the heart is enlarged; whether fluid is building up in the lungs; and whether pacemaker / defibrillator leads are still in place. Prescriptions: IMDUR 60 MG XR24H-TAB (ISOSORBIDE MONONITRATE) take 1 tablet daily after lunch  #30 x 6   Entered by:   Lisabeth Devoid RN   Authorized by:  Lenoria Farrier, MD, [facility]   Signed by:   Lisabeth Devoid RN on 08/04/2009   Method used:   Electronically to        [facility]  [place] [address] (retail)       [address]       [place], [place]  [address]       Ph: [phone] or [phone]       Fax: [phone]   RxID:   [id number]

## 2010-04-29 NOTE — Miscellaneous (Signed)
Summary: Orders Update pft charges  Clinical Lists Changes  Orders: Added new Service order of Carbon Monoxide diffusing w/capacity (94720) - Signed Added new Service order of Lung Volumes (94240) - Signed Added new Service order of Spirometry (Pre & Post) (94060) - Signed 

## 2010-04-29 NOTE — Assessment & Plan Note (Signed)
Summary: Cardiology Nuclear Testing  Nuclear Med Background Indications for Stress Test: Evaluation for Ischemia, Graft Patency, Stent Patency  Indications Comments: .  History: Angioplasty, CABG, COPD, Heart Catheterization, Myocardial Perfusion Study, Stents  History Comments: 1/11 UKG:URKYHCWC infarct, no ischemia, EF=67%; 5/11CABG  Symptoms: Chest Tightness, Chest Tightness with Exertion, Diaphoresis, Dizziness, DOE, Fatigue, Near Syncope, Palpitations, Rapid HR, SOB  Symptoms Comments: Last episode of CP:one week ago.   Nuclear Pre-Procedure Cardiac Risk Factors: Carotid Disease, Claudication, Family History - CAD, History of Smoking, Hypertension, Lipids, PVD Caffeine/Decaff Intake: None NPO After: 6:30 PM Lungs: Diminished, no wheezing.  O2 Sat 94% on RA. IV 0.9% NS with Angio Cath: 22g     IV Site: R Antecubital IV Started by: Irean Hong, RN Chest Size (in) 42     Height (in): 65 Weight (lb): 166 BMI: 27.72  Nuclear Med Study 1 or 2 day study:  1 day     Stress Test Type:  Eugenie Birks Reading MD:  Willa Rough, MD     Referring MD:  Charlies Constable, MD Resting Radionuclide:  Technetium 46m Tetrofosmin     Resting Radionuclide Dose:  11.0 mCi  Stress Radionuclide:  Technetium 63m Tetrofosmin     Stress Radionuclide Dose:  33.0 mCi   Stress Protocol   Lexiscan: 0.4 mg   Stress Test Technologist:  Rea College, CMA-N     Nuclear Technologist:  Domenic Polite, CNMT  Rest Procedure  Myocardial perfusion imaging was performed at rest 45 minutes following the intravenous administration of Technetium 101m Tetrofosmin.  Stress Procedure  The patient received IV Lexiscan 0.4 mg over 15-seconds.  Technetium 56m Tetrofosmin injected at 30-seconds.  There were no significant changes with infusion, other than occasional PVC's.  He did c/o chest tightness with infusion.  Quantitative spect images were obtained after a 45 minute delay.  QPS Raw Data Images:  Normal; no motion  artifact; normal heart/lung ratio. Stress Images:  Moderate decrease in activity in the inferior wall Rest Images:  Same as stress Subtraction (SDS):  No evidence of ischemia. Transient Ischemic Dilatation:  1.19  (Normal <1.22)  Lung/Heart Ratio:  .27  (Normal <0.45)  Quantitative Gated Spect Images QGS EDV:  72 ml QGS ESV:  26 ml QGS EF:  65 % QGS cine images:  Septal dyssynergy (post CABG)  Findings Low risk nuclear study      Overall Impression  Exercise Capacity: Lexiscan with no exercise. BP Response: Normal blood pressure response. Clinical Symptoms: SOB ECG Impression: No significant ST segment change suggestive of ischemia. Overall Impression Comments: There is moderate scar in the inferior wall. There is no ischemia.  Appended Document: Cardiology Nuclear Testing [facility].  Appended Document: Cardiology Nuclear Testing The pt has not called back. He has an office visit on 02/15/10.  Appended Document: Cardiology Nuclear Testing Pt made aware of his results at his office visit today.

## 2010-04-29 NOTE — Letter (Signed)
Summary: [facility] Office Visit   [facility] Office Visit   Imported By: Roderic Ovens 01/12/2010 11:26:11  _____________________________________________________________________  External Attachment:    Type:   Image     Comment:   External Document

## 2010-04-29 NOTE — Letter (Signed)
Summary: Cardiac Catheterization Instructions- [facility]  [facility], Main Office  [address]   [place], [place] [address]   Phone: [phone]  Fax: [phone]     08/04/2009 MRN: [id number]  [name] [address] [place], [place]  [address]  Dear Mr. Jeffery, Newman are scheduled for a Cardiac Catheterization on Aug 11, 2009 with  Dr. Juanda Chance  Please arrive to the 1st floor of the Heart and Vascular Center at [facility] at 5:30 am on the day of your procedure.  You must have someone to drive you home. Someone must be with you for the first 24 hours after you arrive home. Please wear clothes that are easy to get on and off and wear slip-on shoes. Do not eat or drink after midnight except water with your medications that morning. Bring all your medications and current insurance cards with you.    __x_ Make sure you take your aspirin.  __x_ You may take ALL of your medications with water that morning.     The usual length of stay after your procedure is 2 to 3 hours. This can vary.  If you have any questions, please call the office at the number listed above.   Lisabeth Devoid RN

## 2010-04-29 NOTE — Progress Notes (Signed)
Summary: Nuclear Pre-Procedure  Phone Note Outgoing Call Call back at [facility] Phone [phone]   Call placed by: Stanton Kidney, EMT-P,  April 09, 2009 3:03 PM Call placed to: Patient Action Taken: Phone Call Completed Summary of Call: Reviewed information on [employer] Information Sheet (see scanned document for further details).  Spoke with Patient.     Nuclear Med Background Indications for Stress Test: Evaluation for Ischemia, Stent Patency  Indications Comments: .  History: COPD, Heart Catheterization, Myocardial Perfusion Study, Stents  History Comments: Multi- stents in past 08/12/08 MPS: EF=69%, sm. inferior infarct, (-) ischemia 04/01/09 Heart Cath: EF= 65%,patent stents, N/O CAD  Symptoms: Chest Pain, Chest Tightness with Exertion, DOE, SOB    Nuclear Pre-Procedure Cardiac Risk Factors: Claudication, Family History - CAD, Hypertension, Lipids, Overweight, PVD, Smoker Height (in): 66  Nuclear Med Study Referring MD:  B. Juanda Chance

## 2010-04-29 NOTE — Assessment & Plan Note (Signed)
Summary: 6 MO F/U  Medications Added TENORMIN 50 MG TABS (ATENOLOL) Take 2 tablet by mouth daily CALTRATE 600+D 600-400 MG-UNIT  TABS (CALCIUM CARBONATE-VITAMIN D) 1 tab once daily * VITAMIN B12 SHOT 1 shot monthly      Allergies Added:   Visit Type:  Follow-up Primary Provider:  Birdie Sons MD  CC:  pt has had eposides of chest pain , sob, and and increase edema feet ankles.  History of Present Illness: The patient is 70 years old return for management of CAD. He has had multiple PCI procedures and had nonobstructive disease at last catheterization. Had a Myoview scan in May of this year which showed no evidence of ischemia. He says over the past 2-3 months he has had increasing difficulty with shortness of breath with exertion chest tightness with exertion, and lower extremity edema. A few days ago he had 2 episodes of chest pain occurring at rest requiring 2 nitroglycerin for relief.  His other problems include peripheral vascular disease status post iliac stent. He also has hypertension and hyperlipidemia. His iron deficiency anemia and workup showed a colonic polyp which was removed.  He had hernia surgery in July of this past year.    His ECG today was normal.  Current Medications (verified): 1)  Crestor 20 Mg Tabs (Rosuvastatin Calcium) .... 1/2 Every Bedtime 2)  Pantoprazole Sodium 40 Mg Tbec (Pantoprazole Sodium) .... Take 1 Tablet By Mouth Two Times A Day 3)  Tenormin 50 Mg Tabs (Atenolol) .... Take 2 Tablet By Mouth Daily 4)  Triamcinolone Acetonide 0.025 % Oint (Triamcinolone Acetonide) .... Apply 1 A Small Amount To Affected Area Twice A Day 5)  Caltrate 600+d 600-400 Mg-Unit  Tabs (Calcium Carbonate-Vitamin D) .Marland Kitchen.. 1 Tab Once Daily 6)  [facility] Vitamin E 800 Unit  Caps (Vitamin E) .... Once Daily 7)  Aspirin 81 Mg  Tbec (Aspirin) .... Once Daily 8)  Norvasc 10 Mg  Tabs (Amlodipine Besylate) .... Take 1 Tablet By Mouth Once A Day 9)  Altace 10 Mg  Tabs (Ramipril) ....  Take 1 Tablet By Mouth Once A Day 10)  Plavix 75 Mg  Tabs (Clopidogrel Bisulfate) .... Take 1 Tablet By Mouth Once A Day Hold Until 09/17/08 and Then Resume 11)  Trazodone Hcl 50 Mg Tabs (Trazodone Hcl) .... 1/2-1 By Mouth At Bedtime As Needed Insomnia 12)  Spiriva Handihaler 18 Mcg  Caps (Tiotropium Bromide Monohydrate) .... One Inhaled Daily 13)  Ventolin Hfa 108 (90 Base) Mcg/act Aers (Albuterol Sulfate) .... 2 Inh Q4h As Needed Shortness of Breath 14)  Vitamin B12 Shot .Marland Kitchen.. 1 Shot Monthly 15)  Oxycodone-Acetaminophen 5-500 Mg Caps (Oxycodone-Acetaminophen) .... Take 1-2 Every 4 Hours As Needed 16)  Tamsulosin Hcl 0.4 Mg Caps (Tamsulosin Hcl) .... Take 1 Capsule By Mouth Once A Day 17)  Forteo 600 Mcg/2.11ml Soln (Teriparatide (Recombinant)) .... Inject Daily As Directed 18)  Pen Needles 5/16" 31g X 8 Mm Misc (Insulin Pen Needle) .... Use Daily As Directed With Forteo  Allergies (verified): 1)  ! Ticlid  Past History:  Past Medical History: Reviewed history from 12/02/2008 and no changes required. Allergic rhinitis Osteoporosis 1. Coronary artery disease status post multiple percutaneous coronary     inventions with nonobstructive disease at last catheterization. 2. Peripheral vascular status post previous stenting of the totally     occluded iliac and status post stenting of an external iliac in     January 2009 with recurrent symptoms of questionable claudication. 3. Hypertension. 4. Hyperlipidemia.  5. History of amaurosis fugax 1998. 6. Gastroesophageal reflux disease.  anemia, B12 deficiency  Review of Systems       ROS is negative except as outlined in HPI.   Vital Signs:  Patient profile:   70 year old male Height:      66 inches Weight:      168 pounds BMI:     27.21 Pulse rate:   63 / minute BP sitting:   117 / 70  (left arm) Cuff size:   regular  Vitals Entered By: Burnett Kanaris, CNA (March 30, 2009 11:02 AM)  Physical Exam  Additional Exam:  Gen.  Well-nourished, in no distress   Neck: No JVD, thyroid not enlarged, no carotid bruits Lungs: No tachypnea, clear without rales, rhonchi or wheezes Cardiovascular: Rhythm regular, PMI not displaced,  heart sounds  normal, no murmurs or gallops, trace to 1+ peripheral edema, pulses normal in all 4 extremities. Abdomen: BS normal, abdomen soft and non-tender without masses or organomegaly, no hepatosplenomegaly. MS: No deformities, no cyanosis or clubbing   Neuro:  No focal sns   Skin:  no lesions    Impression & Recommendations:  Problem # 1:  CHEST PAIN UNSPECIFIED (ICD-786.50) He has developed chest pain and shortness of breath with exertion and at rest over the past few months with an episode of rest pain a few days ago. These are very suggestive of angina. We will schedule outpatient catheterization on Wednesday. We will also start Imdur 30 mg daily. Unfortunately he is still smoking. Orders: EKG w/ Interpretation (93000) TLB-BMP (Basic Metabolic Panel-BMET) (80048-METABOL) TLB-CBC Platelet - w/Differential (85025-CBCD) TLB-PT (Protime) (85610-PTP) TLB-PTT (85730-PTTL)  Problem # 2:  CORONARY ARTERY DISEASE (ICD-414.00) He has had multiple PCI procedures but had nonobstructive disease at last catheterization and a negative Myoview in May. With his recent symptoms we plan reevaluation with catheterization in the outpatient laboratory. His updated medication list for this problem includes:    Tenormin 50 Mg Tabs (Atenolol) .Marland Kitchen... Take 2 tablet by mouth daily    Aspirin 81 Mg Tbec (Aspirin) ..... Once daily    Norvasc 10 Mg Tabs (Amlodipine besylate) .Marland Kitchen... Take 1 tablet by mouth once a day    Altace 10 Mg Tabs (Ramipril) .Marland Kitchen... Take 1 tablet by mouth once a day    Plavix 75 Mg Tabs (Clopidogrel bisulfate) .Marland Kitchen... Take 1 tablet by mouth once a day hold until 09/17/08 and then resume  Orders: EKG w/ Interpretation (93000) TLB-BMP (Basic Metabolic Panel-BMET) (80048-METABOL) TLB-CBC Platelet  - w/Differential (85025-CBCD) TLB-PT (Protime) (85610-PTP) TLB-PTT (85730-PTTL)  Problem # 3:  COPD (ICD-496) He has COPD and continued cigarette use. If we don't find a source of ischemia then this is a possible explanation for his symptoms. His updated medication list for this problem includes:    Spiriva Handihaler 18 Mcg Caps (Tiotropium bromide monohydrate) ..... One inhaled daily    Ventolin Hfa 108 (90 Base) Mcg/act Aers (Albuterol sulfate) .Marland Kitchen... 2 inh q4h as needed shortness of breath  Problem # 4:  ANEMIA, B12 DEFICIENCY (ICD-281.1) He has chronic anemia and we will check a CBC.  Patient Instructions: 1)  Your physician recommends that you schedule a follow-up appointment in: 3-4 weeks 2)  Your physician recommends that you have lab work today: bmet/cbc/ptt/pt (414.01;786.50;402.10) 3)  Your physician has requested that you have a cardiac catheterization.  Cardiac catheterization is used to diagnose and/or treat various heart conditions. Doctors may recommend this procedure for a number of different reasons. The  most common reason is to evaluate chest pain. Chest pain can be a symptom of coronary artery disease (CAD), and cardiac catheterization can show whether plaque is narrowing or blocking your heart's arteries. This procedure is also used to evaluate the valves, as well as measure the blood flow and oxygen levels in different parts of your heart.  For further information please visit [email or web address]  Please follow instruction sheet, as given.

## 2010-04-29 NOTE — Progress Notes (Signed)
Summary: prescription  Phone Note Call from Patient Call back at Home Phone [phone]   Caller: Patient Call For: wert Summary of Call: Need rx for bystolic 20mg .//[facility] Initial call taken by: Darletta Moll,  April 05, 2010 12:51 PM  Follow-up for Phone Call        Called and verified request, pt has upcoming appointment with MW Jan 27 @ 10:15am. I advised pt I will send in enough to last to next OV and additional will be sent if that is what MW wants him to stay on. Pt c/o dry throat, I asked if he has been rinsing and brushing his tongue after using Symbicort and pt stated he was. I asked pt if he would like something for same and he declined stating he is using warm salt water rinses. Rx sent. Zackery Barefoot CMA  April 05, 2010 2:11 PM     Prescriptions: BYSTOLIC 20 MG TABS (NEBIVOLOL HCL) one half daily  #30 x 0   Entered by:   Zackery Barefoot CMA   Authorized by:   Nyoka Cowden MD   Signed by:   Zackery Barefoot CMA on 04/05/2010   Method used:   [facility]  [place] [address]       [address]       [place], [place]  [address]       Ph: [phone] or [phone]       Fax: [phone]   RxID:   [id number]

## 2010-04-29 NOTE — Assessment & Plan Note (Signed)
Summary: ST/NJR   Vital Signs:  Patient profile:   70 year old male Weight:      170 pounds O2 Sat:      94 % on Room air Pulse rate:   65 / minute Pulse rhythm:   regular BP sitting:   168 / 102  (left arm) Cuff size:   regular  Vitals Entered By: Kyung Rudd, CMA (April 19, 2010 10:40 AM)  O2 Flow:  Room air CC: pt c/o ST, tightness and pain in chest, and laryngitis x 1 wk   Primary Care Provider:  Birdie Sons MD  CC:  pt c/o ST, tightness and pain in chest, and and laryngitis x 1 wk.  History of Present Illness: Patient presents to clinic as a workin for evaluation of cough amd dyspnea. Notes persistent cough/congestion nov/dec treated with abx. CXR 12/11 no acute process. Now states symptoms acute worse x 1 week with cough, dyspnea and intermittent wheezing. Had unquantified fever x2d now resolved. +laryngitis and nasal congestion/drainage.Stopped symbicort 1wk ago after laryngitis. Denies symptoms of thrush. BP elevated on triage and rechecked to be 150/86. States bp recently noted to be nl prior to recent worsening of symptoms.  Current Medications (verified): 1)  Crestor 20 Mg Tabs (Rosuvastatin Calcium) .... 1/2 Tab Once Daily 2)  Furosemide 20 Mg Tabs (Furosemide) .... Take One Tablet By Mouth Daily. 3)  Pantoprazole Sodium 40 Mg Tbec (Pantoprazole Sodium) .... Take  One 30-60 Min Before First Meal of The Day 4)  Aspirin 81 Mg  Tabs (Aspirin) .... Take 1 Tablet By Mouth Once A Day 5)  Bystolic 20 Mg Tabs (Nebivolol Hcl) .... One Half Daily 6)  Spiriva Handihaler 18 Mcg  Caps (Tiotropium Bromide Monohydrate) .... One Inhaled Daily 7)  Symbicort 160-4.5 Mcg/act Aero (Budesonide-Formoterol Fumarate) .... 2 Puffs Two Times A Day 8)  Caltrate 600+d 600-400 Mg-Unit  Tabs (Calcium Carbonate-Vitamin D) .Marland Kitchen.. 1 Tab Once Daily 9)  Forteo 600 Mcg/2.35ml Soln (Teriparatide (Recombinant)) .... Inject Daily As Directed 10)  Cyanocobalamin 1000 Mcg/ml Inj Soln  (Cyanocobalamin) .... Inject 1 Cc Intramuscularly Monthly 11)  Pepcid 20 Mg Tabs (Famotidine) .... Take One By Mouth At Bedtime 12)  Triamcinolone Acetonide 0.1 % Crea (Triamcinolone Acetonide) .... Apply As Needed 13)  Tums 500 Mg Chew (Calcium Carbonate Antacid) .... As Needed 14)  Ventolin Hfa 108 (90 Base) Mcg/act Aers (Albuterol Sulfate) .... Inhale 2 Puffs Every 4hours As Needed Shortness of Breath 15)  Mucinex Dm 30-600 Mg Xr12h-Tab (Dextromethorphan-Guaifenesin) .Marland Kitchen.. 1 Two Times A Day As Needed 16)  Pen Needles 5/16" 31g X 8 Mm Misc (Insulin Pen Needle) .... Use Daily As Directed With Forteo  Allergies (verified): 1)  ! Ticlid  Past History:  Past medical, surgical, family and social histories (including risk factors) reviewed for relevance to current acute and chronic problems.  Past Medical History: Reviewed history from 03/11/2010 and no changes required. Allergic rhinitis Osteoporosis 1. Coronary artery disease status post multiple percutaneous coronary     inventions  > CABG 09/2009 2. Peripheral vascular status post previous stenting of the totally     occluded iliac and status post stenting of an external iliac in     January 2009 with recurrent symptoms of questionable claudication. 3. Hypertension. 4. Hyperlipidemia. 5. History of amaurosis fugax 1998. 6. Gastroesophageal reflux disease.  anemia, B12 deficiency AFIB--post CABG---amiodarone Atrial fibrillation COPD     - PFT's 11/3  FEV1 1.16 46%  ratio 44  with 27% improvement p  B2     - HFA 50% p coaching February 25, 2010  Hemoptysis onset 01/2010     - CT chest 02/19/10 : no PE, no ca/ nodular infiltrates  COMPLEX MED REGIMEN Meds reviewed with pt education and computerized med calendar completed March 11, 2010   Past Surgical History: Reviewed history from 11/06/2009 and no changes required. PTCA/stent  04/06/00 Thyroidectomy, partial stent, coronary artery   03/07/01 cholecystectomy appendectomy hemorrhoidectomy Inguinal herniorrhaphy  10/10/08 CABG 2011  Family History: Reviewed history from 08/14/2008 and no changes required. father deceased MI-81yo Family History of CAD Male 1st degree relative <50 mother deceased age 14 yo Rheumatoid arthritis 2 brothers and a nephew with heart disease No FH of Colon Cancer:  Social History: Reviewed history from 02/16/2010 and no changes required. Widowed Former smoker.  Quit July 2011.  Smoked approx 50 yrs up to 3 ppd Regular exercise-no Alcohol Use - yes Illicit Drug Use - no Retired from [employer] and [employer]  Review of Systems General:  Complains of chills and fever. Eyes:  Complains of discharge; denies eye irritation, eye pain, and red eye. ENT:  Complains of hoarseness, nasal congestion, postnasal drainage, and sore throat; denies difficulty swallowing. Resp:  Complains of cough, shortness of breath, sputum productive, and wheezing.  Physical Exam  General:  Well-developed,well-nourished,in no acute distress; alert,appropriate and cooperative throughout examination Head:  Normocephalic and atraumatic without obvious abnormalities. No apparent alopecia or balding. Eyes:  pupils equal, pupils round, corneas and lenses clear, and no injection.   Ears:  External ear exam shows no significant lesions or deformities.  Otoscopic examination reveals clear canals, tympanic membranes are intact bilaterally without bulging, retraction, inflammation or discharge. Hearing is grossly normal bilaterally. Nose:  +nasal clear drainage.no external deformity and no airflow obstruction.   Mouth:  pharynx pink and moist, no erythema, no exudates, no lesions, and no aphthous ulcers.   Neck:  No deformities, masses, or tenderness noted. Lungs:  Normal respiratory effort, chest expands symmetrically. Lungs are clear to auscultation, no crackles or wheezes. Heart:  Normal rate and regular rhythm. S1 and S2 normal  without gallop, murmur, click, rub or other extra sounds. Neurologic:  alert & oriented X3 and gait normal.   Skin:  turgor normal, color normal, and no rashes.   Psych:  Oriented X3, normally interactive, good eye contact, not anxious appearing, and not depressed appearing.     Impression & Recommendations:  Problem # 1:  ACUTE BRONCHITIS (ICD-466.0) Assessment New  given comorbidities recommend empiric antibiotic treatment. followup if no improvement or worsening His updated medication list for this problem includes:    Spiriva Handihaler 18 Mcg Caps (Tiotropium bromide monohydrate) ..... One inhaled daily    Symbicort 160-4.5 Mcg/act Aero (Budesonide-formoterol fumarate) .Marland Kitchen... 2 puffs two times a day    Ventolin Hfa 108 (90 Base) Mcg/act Aers (Albuterol sulfate) ..... Inhale 2 puffs every 4hours as needed shortness of breath    Mucinex Dm 30-600 Mg Xr12h-tab (Dextromethorphan-guaifenesin) .Marland Kitchen... 1 two times a day as needed    Doxycycline Hyclate 100 Mg Tabs (Doxycycline hyclate) ..... One by mouth bid  Problem # 2:  COPD (ICD-496) Assessment: Deteriorated  exacerbation. no acute respiratory distress. begin antibiotic and Medrol Dosepak. Follow closely if no improvement or worsening. His updated medication list for this problem includes:    Spiriva Handihaler 18 Mcg Caps (Tiotropium bromide monohydrate) ..... One inhaled daily    Symbicort 160-4.5 Mcg/act Aero (Budesonide-formoterol fumarate) .Marland Kitchen... 2 puffs two times a  day    Ventolin Hfa 108 (90 Base) Mcg/act Aers (Albuterol sulfate) ..... Inhale 2 puffs every 4hours as needed shortness of breath  Problem # 3:  HYPERTENSION (ICD-401.9) Assessment: Deteriorated  suboptimal control. isolated elevation likely due to acute illness. recommend out patient blood pressure log and continue current regimen. His updated medication list for this problem includes:    Furosemide 20 Mg Tabs (Furosemide) .Marland Kitchen... Take one tablet by mouth daily.     Bystolic 20 Mg Tabs (Nebivolol hcl) ..... One half daily  Complete Medication List: 1)  Crestor 20 Mg Tabs (Rosuvastatin calcium) .... 1/2 tab once daily 2)  Furosemide 20 Mg Tabs (Furosemide) .... Take one tablet by mouth daily. 3)  Pantoprazole Sodium 40 Mg Tbec (Pantoprazole sodium) .... Take  one 30-60 min before first meal of the day 4)  Aspirin 81 Mg Tabs (Aspirin) .... Take 1 tablet by mouth once a day 5)  Bystolic 20 Mg Tabs (Nebivolol hcl) .... One half daily 6)  Spiriva Handihaler 18 Mcg Caps (Tiotropium bromide monohydrate) .... One inhaled daily 7)  Symbicort 160-4.5 Mcg/act Aero (Budesonide-formoterol fumarate) .... 2 puffs two times a day 8)  Caltrate 600+d 600-400 Mg-unit Tabs (Calcium carbonate-vitamin d) .Marland Kitchen.. 1 tab once daily 9)  Forteo 600 Mcg/2.46ml Soln (Teriparatide (recombinant)) .... Inject daily as directed 10)  Cyanocobalamin 1000 Mcg/ml Inj Soln (Cyanocobalamin) .... Inject 1 cc intramuscularly monthly 11)  Pepcid 20 Mg Tabs (Famotidine) .... Take one by mouth at bedtime 12)  Triamcinolone Acetonide 0.1 % Crea (Triamcinolone acetonide) .... Apply as needed 13)  Tums 500 Mg Chew (Calcium carbonate antacid) .... As needed 14)  Ventolin Hfa 108 (90 Base) Mcg/act Aers (Albuterol sulfate) .... Inhale 2 puffs every 4hours as needed shortness of breath 15)  Mucinex Dm 30-600 Mg Xr12h-tab (Dextromethorphan-guaifenesin) .Marland Kitchen.. 1 two times a day as needed 16)  Pen Needles 5/16" 31g X 8 Mm Misc (Insulin pen needle) .... Use daily as directed with forteo 17)  Doxycycline Hyclate 100 Mg Tabs (Doxycycline hyclate) .... One by mouth bid 18)  Medrol (pak) 4 Mg Tabs (Methylprednisolone) .... As directed Prescriptions: MEDROL (PAK) 4 MG TABS (METHYLPREDNISOLONE) as directed  #1 x 0   Entered and Authorized by:   Edwyna Perfect MD   Signed by:   Edwyna Perfect MD on 04/19/2010   Method used:   [facility]  [place] [address]       [address]        [place], [place]  [address]       Ph: [phone] or [phone]       Fax: [phone]   RxID:   [id number] DOXYCYCLINE HYCLATE 100 MG TABS (DOXYCYCLINE HYCLATE) one by mouth bid  #14 x 0   Entered and Authorized by:   Edwyna Perfect MD   Signed by:   Edwyna Perfect MD on 04/19/2010   Method used:   [facility]        [facility]  [place] [address] [facility]       [address]       [place], [place]  [address]       Ph: [phone] or [phone]       Fax: [phone]   RxID:   [id number]    Orders Added: 1)  Est. Patient Level IV [25427]

## 2010-04-29 NOTE — Assessment & Plan Note (Signed)
Summary: Med Check/cb   Vital Signs:  Patient profile:   70 year old male Weight:      160 pounds Temp:     98.4 degrees F oral Pulse rate:   78 / minute BP sitting:   110 / 70  (right arm) Cuff size:   regular  Vitals Entered By: Romualdo Bolk, CMA (AAMA) (November 06, 2009 9:30 AM) CC: Post Hospital Follow up   Primary Care Provider:  Birdie Sons MD  CC:  [facility] Follow up.  History of Present Illness:  Follow-Up Visit      This is a 70 year old man who presents for Follow-up visit.  The patient complains of chest pain, but denies edema and SOB.  Since the last visit the patient notes a recent hospitilization and being seen by a specialist.  The patient reports taking meds as prescribed and dietary compliance.  When questioned about possible medication side effects, the patient notes none.   recent CABG---pain improving (chest pain)  All other systems reviewed and were negative   Preventive Screening-Counseling & Management  Alcohol-Tobacco     Smoking Status: current     Packs/Day: 0.5  Caffeine-Diet-Exercise     Does Patient Exercise: no  Current Problems (verified): 1)  Peripheral Vascular Disease  (ICD-443.9) 2)  Carotid Artery Stenosis  (ICD-433.10) 3)  Anemia, B12 Deficiency  (ICD-281.1) 4)  Gi Bleeding  (ICD-578.9) 5)  Diverticular Disease  (ICD-562.10) 6)  Colonic Polyps  (ICD-211.3) 7)  COPD  (ICD-496) 8)  Hiatal Hernia  (ICD-553.3) 9)  Gerd  (ICD-530.81) 10)  Nephrolithiasis  (ICD-592.0) 11)  Tobacco Use  (ICD-305.1) 12)  Anemia, Iron Deficiency  (ICD-280.9) 13)  Osteoporosis  (ICD-733.00) 14)  Myocardial Infarction, Hx of  (ICD-412) 15)  Hypertension  (ICD-401.9) 16)  Hyperlipidemia  (ICD-272.4) 17)  Coronary Artery Disease  (ICD-414.00)  Current Medications (verified): 1)  Crestor 20 Mg Tabs (Rosuvastatin Calcium) .... 1/2 Every Bedtime 2)  Pantoprazole Sodium 40 Mg Tbec (Pantoprazole Sodium) .... Take 1 Tablet By Mouth Two Times A  Day 3)  Triamcinolone Acetonide 0.025 % Oint (Triamcinolone Acetonide) .... Apply 1 A Small Amount To Affected Area Twice A Day 4)  Caltrate 600+d 600-400 Mg-Unit  Tabs (Calcium Carbonate-Vitamin D) .Marland Kitchen.. 1 Tab Once Daily 5)  Cvs Vitamin E 800 Unit  Caps (Vitamin E) .... Once Daily 6)  Aspirin 81 Mg  Tabs (Aspirin) 7)  Norvasc 10 Mg  Tabs (Amlodipine Besylate) .... Take 1 Tablet By Mouth Once A Day 8)  Spiriva Handihaler 18 Mcg  Caps (Tiotropium Bromide Monohydrate) .... One Inhaled Daily 9)  Ventolin Hfa 108 (90 Base) Mcg/act Aers (Albuterol Sulfate) .... 2 Inh Q4h As Needed Shortness of Breath 10)  Vitamin B12 Shot .Marland Kitchen.. 1 Shot Monthly 11)  Oxycodone-Acetaminophen 5-500 Mg Caps (Oxycodone-Acetaminophen) .... Take 1-2 Every 4 Hours As Needed 12)  Forteo 600 Mcg/2.67ml Soln (Teriparatide (Recombinant)) .... Inject Daily As Directed 13)  Pen Needles 5/16" 31g X 8 Mm Misc (Insulin Pen Needle) .... Use Daily As Directed With Forteo 14)  Dicyclomine Hcl 20 Mg Tabs (Dicyclomine Hcl) .... Take One Daily 15)  Tamsulosin Hcl 0.4 Mg Caps (Tamsulosin Hcl) .... Take 1 Tablet By Mouth Once A Day 16)  Amiodarone Hcl 200 Mg Tabs (Amiodarone Hcl) .Marland Kitchen.. 1 By Mouth Two Times A Day 17)  Ferrous Gluconate 324 Mg Tabs (Ferrous Gluconate) .Marland Kitchen.. 1 By Mouth Two Times A Day 18)  Benadryl 25 Mg Tabs (Diphenhydramine Hcl) .Marland KitchenMarland KitchenMarland Kitchen  1 By Mouth Q 6 Hours As Needed Itching 19)  Guaifenesin 600mg  20)  Oxycodone Hcl 5 Mg Caps (Oxycodone Hcl) .Marland Kitchen.. 1-2 Q 4-6 Hours As Needed For Pain 21)  Altace 10 Mg Caps (Ramipril) .Marland Kitchen.. 1 By Mouth Once Daily 22)  Caltrate 600+d 600-400 Mg-Unit Tabs (Calcium Carbonate-Vitamin D)  Allergies (verified): 1)  ! Ticlid  Past History:  Past Medical History: Allergic rhinitis Osteoporosis 1. Coronary artery disease status post multiple percutaneous coronary     inventions with nonobstructive disease at last catheterization. 2. Peripheral vascular status post previous stenting of the totally      occluded iliac and status post stenting of an external iliac in     January 2009 with recurrent symptoms of questionable claudication. 3. Hypertension. 4. Hyperlipidemia. 5. History of amaurosis fugax 1998. 6. Gastroesophageal reflux disease.  anemia, B12 deficiency AFIB--post CABG---amiodarone Atrial fibrillation  Past Surgical History: PTCA/stent  04/06/00 Thyroidectomy, partial stent, coronary artery  03/07/01 cholecystectomy appendectomy hemorrhoidectomy Inguinal herniorrhaphy  10/10/08 CABG 2011  Social History: Married-widowed Current Smoker--quit before CABG Regular exercise-no Alcohol Use - yes Illicit Drug Use - no  Physical Exam  General:  alert and well-developed.   Head:  normocephalic and atraumatic.   Eyes:  pupils equal and pupils round.   Ears:  R ear normal and L ear normal.   Neck:  Supple; no masses or thyromegaly. Chest Wall:  midline sternal scar---no tenderness Lungs:  normal respiratory effort and no intercostal retractions.   Heart:  normal rate and regular rhythm.   Abdomen:  soft and non-tender.   Msk:  No deformity or scoliosis noted of thoracic or lumbar spine.   Neurologic:  gait normal.   Skin:  turgor normal and color normal.   Psych:  normally interactive and good eye contact.     Impression & Recommendations:  Problem # 1:  CORONARY ARTERY DISEASE (ICD-414.00)  s/p CABG--doing well  His updated medication list for this problem includes:    Aspirin 81 Mg Tabs (Aspirin)    Norvasc 10 Mg Tabs (Amlodipine besylate) .Marland Kitchen... Take 1 tablet by mouth once a day    Amiodarone Hcl 200 Mg Tabs (Amiodarone hcl) .Marland Kitchen... 1 by mouth two times a day    Altace 10 Mg Caps (Ramipril) .Marland Kitchen... 1 by mouth once daily  Labs Reviewed: Chol: 126 (06/23/2009)   HDL: 41.60 (06/23/2009)   LDL: 64 (06/23/2009)   TG: 104.0 (06/23/2009)  Problem # 2:  ATRIAL FIBRILLATION (ICD-427.31) /ost CABG on warfarin in hospital - stopped because no recurrence whil on  amiodarone His updated medication list for this problem includes:    Aspirin 81 Mg Tabs (Aspirin)    Norvasc 10 Mg Tabs (Amlodipine besylate) .Marland Kitchen... Take 1 tablet by mouth once a day    Amiodarone Hcl 200 Mg Tabs (Amiodarone hcl) .Marland Kitchen... 1 by mouth two times a day  Problem # 3:  COPD (ICD-496) continue current medications  says his breathing is much bettter after CABG have advised continued smoking cessation His updated medication list for this problem includes:    Spiriva Handihaler 18 Mcg Caps (Tiotropium bromide monohydrate) ..... One inhaled daily    Ventolin Hfa 108 (90 Base) Mcg/act Aers (Albuterol sulfate) .Marland Kitchen... 2 inh q4h as needed shortness of breath  Complete Medication List: 1)  Crestor 20 Mg Tabs (Rosuvastatin calcium) .... 1/2 every bedtime 2)  Pantoprazole Sodium 40 Mg Tbec (Pantoprazole sodium) .... Take 1 tablet by mouth two times a day 3)  Triamcinolone Acetonide 0.025 %  Oint (Triamcinolone acetonide) .... Apply 1 a small amount to affected area twice a day 4)  Caltrate 600+d 600-400 Mg-unit Tabs (Calcium carbonate-vitamin d) .Marland Kitchen.. 1 tab once daily 5)  Cvs Vitamin E 800 Unit Caps (Vitamin e) .... Once daily 6)  Aspirin 81 Mg Tabs (Aspirin) 7)  Norvasc 10 Mg Tabs (Amlodipine besylate) .... Take 1 tablet by mouth once a day 8)  Spiriva Handihaler 18 Mcg Caps (Tiotropium bromide monohydrate) .... One inhaled daily 9)  Ventolin Hfa 108 (90 Base) Mcg/act Aers (Albuterol sulfate) .... 2 inh q4h as needed shortness of breath 10)  Vitamin B12 Shot  .Marland Kitchen.. 1 shot monthly 11)  Oxycodone-acetaminophen 5-500 Mg Caps (Oxycodone-acetaminophen) .... Take 1-2 every 4 hours as needed 12)  Forteo 600 Mcg/2.72ml Soln (Teriparatide (recombinant)) .... Inject daily as directed 13)  Pen Needles 5/16" 31g X 8 Mm Misc (Insulin pen needle) .... Use daily as directed with forteo 14)  Dicyclomine Hcl 20 Mg Tabs (Dicyclomine hcl) .... Take one daily 15)  Tamsulosin Hcl 0.4 Mg Caps (Tamsulosin hcl)  .... Take 1 tablet by mouth once a day 16)  Amiodarone Hcl 200 Mg Tabs (Amiodarone hcl) .Marland Kitchen.. 1 by mouth two times a day 17)  Ferrous Gluconate 324 Mg Tabs (Ferrous gluconate) .Marland Kitchen.. 1 by mouth two times a day 18)  Benadryl 25 Mg Tabs (Diphenhydramine hcl) .Marland Kitchen.. 1 by mouth q 6 hours as needed itching 19)  Guaifenesin 600mg   20)  Oxycodone Hcl 5 Mg Caps (Oxycodone hcl) .Marland Kitchen.. 1-2 q 4-6 hours as needed for pain 21)  Altace 10 Mg Caps (Ramipril) .Marland Kitchen.. 1 by mouth once daily 22)  Caltrate 600+d 600-400 Mg-unit Tabs (Calcium carbonate-vitamin d)  Patient Instructions: 1)  Please schedule a follow-up appointment in 1 month.

## 2010-04-29 NOTE — Cardiovascular Report (Signed)
Summary: Cardiac Cath Report  Cardiac Cath Report   Imported By: Roderic Ovens 08/12/2009 10:33:52  _____________________________________________________________________  External Attachment:    Type:   Image     Comment:   External Document

## 2010-04-29 NOTE — Miscellaneous (Signed)
Summary: Orders Update  Clinical Lists Changes  Orders: Added new Test order of Arterial Duplex Lower Extremity (Arterial Duplex Low) - Signed 

## 2010-04-29 NOTE — Assessment & Plan Note (Signed)
Summary: 1  MONTH ROV/NJR   Vital Signs:  Patient profile:   70 year old male Height:      65 inches (165.10 cm) Weight:      161.50 pounds (73.41 kg) BMI:     26.97 Temp:     98.3 degrees F (36.83 degrees C) oral BP sitting:   130 / 82  (left arm) Cuff size:   regular  Vitals Entered By: Lucious Groves CMA (December 04, 2009 10:21 AM) CC: 1 mo rtn ov/B-12 injection./kb Is Patient Diabetic? No Pain Assessment Patient in pain? no      Comments Patient requests flu shot. No refills needed today per patient./kb   Primary Care Provider:  Birdie Sons MD  CC:  1 mo rtn ov/B-12 injection./kb.  History of Present Illness:  Follow-Up Visit      This is a 70 year old man who presents for Follow-up visit.  The patient complains of chest pain (post surgical), but denies palpitations, edema, SOB, DOE, PND, and orthopnea.  Since the last visit the patient notes no new problems or concerns and problems with medications.  The patient reports taking meds as prescribed and not taking meds as prescribed.  When questioned about possible medication side effects, the patient notes none.    All other systems reviewed and were negative   Current Problems (verified): 1)  Atrial Fibrillation  (ICD-427.31) 2)  Peripheral Vascular Disease  (ICD-443.9) 3)  Carotid Artery Stenosis  (ICD-433.10) 4)  Anemia, B12 Deficiency  (ICD-281.1) 5)  Gi Bleeding  (ICD-578.9) 6)  Diverticular Disease  (ICD-562.10) 7)  Colonic Polyps  (ICD-211.3) 8)  COPD  (ICD-496) 9)  Hiatal Hernia  (ICD-553.3) 10)  Gerd  (ICD-530.81) 11)  Nephrolithiasis  (ICD-592.0) 12)  Tobacco Use  (ICD-305.1) 13)  Anemia, Iron Deficiency  (ICD-280.9) 14)  Osteoporosis  (ICD-733.00) 15)  Myocardial Infarction, Hx of  (ICD-412) 16)  Hypertension  (ICD-401.9) 17)  Hyperlipidemia  (ICD-272.4) 18)  Coronary Artery Disease  (ICD-414.00)  Current Medications (verified): 1)  Crestor 20 Mg Tabs (Rosuvastatin Calcium) .... 1/2 Every  Bedtime 2)  Pantoprazole Sodium 40 Mg Tbec (Pantoprazole Sodium) .... Take 1 Tablet By Mouth Two Times A Day 3)  Caltrate 600+d 600-400 Mg-Unit  Tabs (Calcium Carbonate-Vitamin D) .Marland Kitchen.. 1 Tab Once Daily 4)  Aspirin 81 Mg  Tabs (Aspirin) 5)  Norvasc 10 Mg  Tabs (Amlodipine Besylate) .... Take 1 Tablet By Mouth Once A Day 6)  Spiriva Handihaler 18 Mcg  Caps (Tiotropium Bromide Monohydrate) .... One Inhaled Daily 7)  Ventolin Hfa 108 (90 Base) Mcg/act Aers (Albuterol Sulfate) .... 2 Inh Q4h As Needed Shortness of Breath 8)  Cyanocobalamin 1000 Mcg/ml Inj Soln (Cyanocobalamin) .... Inject 1 Cc Intramuscularly Monthly 9)  Forteo 600 Mcg/2.26ml Soln (Teriparatide (Recombinant)) .... Inject Daily As Directed 10)  Pen Needles 5/16" 31g X 8 Mm Misc (Insulin Pen Needle) .... Use Daily As Directed With Forteo 11)  Amiodarone Hcl 200 Mg Tabs (Amiodarone Hcl) .Marland Kitchen.. 1 By Mouth Two Times A Day 12)  Ferrous Gluconate 324 Mg Tabs (Ferrous Gluconate) .Marland Kitchen.. 1 By Mouth Two Times A Day 13)  Benadryl 25 Mg Tabs (Diphenhydramine Hcl) .Marland Kitchen.. 1 By Mouth Q 6 Hours As Needed Itching 14)  Altace 10 Mg Caps (Ramipril) .Marland Kitchen.. 1 By Mouth Once Daily  Allergies (verified): 1)  ! Ticlid  Physical Exam  General:  alert and well-developed.   Head:  normocephalic and atraumatic.   Eyes:  pupils equal and pupils  round.   Ears:  R ear normal and L ear normal.   Nose:  no external deformity and nose piercing noted.   Neck:  Supple; no masses or thyromegaly. Chest Wall:  No deformities, masses, tenderness or gynecomastia noted. Lungs:  normal respiratory effort and no intercostal retractions.   Heart:  normal rate and regular rhythm.   Abdomen:  soft and non-tender.   Skin:  turgor normal and color normal.   Right medial calf---palpable tender thickening at vein harvesting site Psych:  normally interactive and good eye contact.     Impression & Recommendations:  Problem # 1:  ATRIAL FIBRILLATION (ICD-427.31)  no known  recurrence pt interested in minimizing medications---likely could decrease amiodarone to once daily. dr. Juanda Chance to decide His updated medication list for this problem includes:    Aspirin 81 Mg Tabs (Aspirin)    Norvasc 10 Mg Tabs (Amlodipine besylate) .Marland Kitchen... Take 1 tablet by mouth once a day    Amiodarone Hcl 200 Mg Tabs (Amiodarone hcl) .Marland Kitchen... 1 by mouth two times a day  Reviewed the following: PT: 12.4 (08/04/2009)   INR: 1.2 ratio (08/04/2009)  Orders: Venipuncture (81191) TLB-CBC Platelet - w/Differential (85025-CBCD) TLB-TSH (Thyroid Stimulating Hormone) (84443-TSH)  Problem # 2:  GERD (ICD-530.81) controlled continue current medications  His updated medication list for this problem includes:    Pantoprazole Sodium 40 Mg Tbec (Pantoprazole sodium) .Marland Kitchen... Take 1 tablet by mouth two times a day  Problem # 3:  HYPERTENSION (ICD-401.9)  controlled continue current medications  His updated medication list for this problem includes:    Norvasc 10 Mg Tabs (Amlodipine besylate) .Marland Kitchen... Take 1 tablet by mouth once a day    Altace 10 Mg Caps (Ramipril) .Marland Kitchen... 1 by mouth once daily  BP today: 130/82 Prior BP: 110/70 (11/06/2009)  Labs Reviewed: K+: 4.6 (08/04/2009) Creat: : 0.8 (08/04/2009)   Chol: 126 (06/23/2009)   HDL: 41.60 (06/23/2009)   LDL: 64 (06/23/2009)   TG: 104.0 (06/23/2009)  Orders: TLB-BMP (Basic Metabolic Panel-BMET) (80048-METABOL)  Problem # 4:  CORONARY ARTERY DISEASE (ICD-414.00) no sxs exercising regularly His updated medication list for this problem includes:    Aspirin 81 Mg Tabs (Aspirin)    Norvasc 10 Mg Tabs (Amlodipine besylate) .Marland Kitchen... Take 1 tablet by mouth once a day    Amiodarone Hcl 200 Mg Tabs (Amiodarone hcl) .Marland Kitchen... 1 by mouth two times a day    Altace 10 Mg Caps (Ramipril) .Marland Kitchen... 1 by mouth once daily  Complete Medication List: 1)  Crestor 20 Mg Tabs (Rosuvastatin calcium) .... 1/2 every bedtime 2)  Pantoprazole Sodium 40 Mg Tbec (Pantoprazole  sodium) .... Take 1 tablet by mouth once a day 3)  Caltrate 600+d 600-400 Mg-unit Tabs (Calcium carbonate-vitamin d) .Marland Kitchen.. 1 tab once daily 4)  Aspirin 81 Mg Tabs (Aspirin) 5)  Norvasc 10 Mg Tabs (Amlodipine besylate) .... Take 1 tablet by mouth once a day 6)  Spiriva Handihaler 18 Mcg Caps (Tiotropium bromide monohydrate) .... One inhaled daily 7)  Ventolin Hfa 108 (90 Base) Mcg/act Aers (Albuterol sulfate) .... 2 inh q4h as needed shortness of breath 8)  Cyanocobalamin 1000 Mcg/ml Inj Soln (Cyanocobalamin) .... Inject 1 cc intramuscularly monthly 9)  Forteo 600 Mcg/2.51ml Soln (Teriparatide (recombinant)) .... Inject daily as directed 10)  Pen Needles 5/16" 31g X 8 Mm Misc (Insulin pen needle) .... Use daily as directed with forteo 11)  Amiodarone Hcl 200 Mg Tabs (Amiodarone hcl) .Marland Kitchen.. 1 by mouth two times a day 12)  Ferrous Gluconate 324 Mg Tabs (Ferrous gluconate) .Marland Kitchen.. 1 by mouth two times a day 13)  Benadryl 25 Mg Tabs (Diphenhydramine hcl) .Marland Kitchen.. 1 by mouth q 6 hours as needed itching 14)  Altace 10 Mg Caps (Ramipril) .Marland Kitchen.. 1 by mouth once daily  Other Orders: TLB-Lipid Panel (80061-LIPID) TLB-Hepatic/Liver Function Pnl (80076-HEPATIC)  Patient Instructions: 1)  Please schedule a follow-up appointment in 3 months. Prescriptions: CYANOCOBALAMIN 1000 MCG/ML INJ SOLN (CYANOCOBALAMIN) inject 1 cc intramuscularly monthly  #10 cc x 1   Entered and Authorized by:   Birdie Sons MD   Signed by:   Birdie Sons MD on 12/04/2009   Method used:   Historical   RxID:   [id number]   Appended Document: 1  MONTH ROV/NJR     Clinical Lists Changes  Orders: Added new Service order of Flu Vaccine 52yrs + MEDICARE PATIENTS (U2725) - Signed Added new Service order of Administration Flu vaccine - MCR (D6644) - Signed Added new Service order of Vit B12 1000 mcg (I3474) - Signed Added new Service order of Admin of Therapeutic Inj  intramuscular or subcutaneous (25956) -  Signed Observations: Added new observation of FLU VAX VIS: 10/20/2009 version (12/04/2009 10:50) Added new observation of FLU VAXLOT: [id number] (12/04/2009 10:50) Added new observation of FLU VAXMFR: [employer] (12/04/2009 10:50) Added new observation of FLU VAX EXP: 09/25/2010 (12/04/2009 10:50) Added new observation of FLU VAX DSE: 0.47ml (12/04/2009 10:50) Added new observation of FLU VAX: Fluvax 3+ (12/04/2009 10:50)                        Flu Vaccine Consent Questions     Do you have a history of severe allergic reactions to this vaccine? no    Any prior history of allergic reactions to egg and/or gelatin? no    Do you have a sensitivity to the preservative Thimersol? no    Do you have a past history of Guillan-Barre Syndrome? no    Do you currently have an acute febrile illness? no    Have you ever had a severe reaction to latex? no    Vaccine information given and explained to patient? yes    Are you currently pregnant? no    Lot Number:[id number]   Exp Date:09/25/2010   Site Given  Left Deltoid IMedflu Lucious Groves CMA  December 04, 2009 10:50 AM  Medication Administration  Injection # 1:    Medication: Vit B12 1000 mcg    Diagnosis: ANEMIA, B12 DEFICIENCY (ICD-281.1)    Route: IM    Site: R deltoid    Exp Date: 09/26/2011    Lot #: [id number]    Mfr: [employer]    Patient tolerated injection without complications    Given by: Lucious Groves CMA (December 04, 2009 10:51 AM)  Orders Added: 1)  Flu Vaccine 32yrs + MEDICARE PATIENTS [Q2039] 2)  Administration Flu vaccine - MCR [G0008] 3)  Vit B12 1000 mcg [J3420] 4)  Admin of Therapeutic Inj  intramuscular or subcutaneous [96372]  Appended Document: Orders Update     Clinical Lists Changes  Orders: Added new Service order of Specimen Handling ([id number]) - Signed

## 2010-04-29 NOTE — Miscellaneous (Signed)
  Clinical Lists Changes  Observations: Added new observation of NUCLEAR NOS: Exercise Capacity: Lexiscan study.  BP Response: Hypotensive blood pressure response. Clinical Symptoms: Short of breath.  ECG Impression: No significant ST segment change suggestive of ischemia. Overall Impression: Fixed basal to mid inferior perfusion defect suggestive of infarction.  No ischemia.  Basal inferior akinesis with perserved systolic function.   (04/13/2009 9:36)      Nuclear Study  Procedure date:  04/13/2009  Findings:      Exercise Capacity: Lexiscan study.  BP Response: Hypotensive blood pressure response. Clinical Symptoms: Short of breath.  ECG Impression: No significant ST segment change suggestive of ischemia. Overall Impression: Fixed basal to mid inferior perfusion defect suggestive of infarction.  No ischemia.  Basal inferior akinesis with perserved systolic function.

## 2010-04-29 NOTE — Progress Notes (Signed)
Summary: question re med/dental cleaning   Phone Note Call from Patient   Caller: Patient [phone] Reason for Call: Talk to Nurse Summary of Call: pt has dental appt for cleaning next tuesdaythey want to know if he needs to come off or go on any meds? he states it will be just a cleaning-no procedures-pls call [phone] Initial call taken by: Glynda Jaeger,  November 18, 2009 8:07 AM  Follow-up for Phone Call        Spoke with pt. Pt. Patient would like to know if he needs to hold any medication prior dental cleaning. I went over the list of medications with pt. I let pt. know, none of the mediction will need to be hold. Pt. verbalized understanding. Follow-up by: Ollen Gross, RN, BSN,  November 18, 2009 8:44 AM

## 2010-04-29 NOTE — Letter (Signed)
Summary: [facility]   [facility]   Imported By: Roderic Ovens 11/04/2009 14:55:36  _____________________________________________________________________  External Attachment:    Type:   Image     Comment:   External Document

## 2010-04-29 NOTE — Assessment & Plan Note (Signed)
Summary: B12 INJ // RS   Nurse Visit   Allergies: 1)  ! Ticlid  Medication Administration  Injection # 1:    Medication: Vit B12 1000 mcg    Diagnosis: ANEMIA, B12 DEFICIENCY (ICD-281.1)    Route: IM    Site: L deltoid    Exp Date: 12/25/2010    Lot #: [id number]    Mfr: [employer] Regent    Patient tolerated injection without complications    Given by: Willy Eddy, LPN (May 07, 2009 10:19 AM)  Orders Added: 1)  Vit B12 1000 mcg [J3420] 2)  Admin of Therapeutic Inj  intramuscular or subcutaneous [81191]

## 2010-04-29 NOTE — Assessment & Plan Note (Signed)
Summary: B-12INJ/RCD   Nurse Visit   Allergies: 1)  ! Ticlid  Medication Administration  Injection # 1:    Medication: Vit B12 1000 mcg    Diagnosis: ANEMIA, B12 DEFICIENCY (ICD-281.1)    Route: IM    Site: R deltoid    Exp Date: 11/27/2010    Lot #: [id number]    Mfr: [employer]ent    Patient tolerated injection without complications    Given by: Gladis Riffle, RN (Jul 30, 2009 10:07 AM)  Orders Added: 1)  Vit B12 1000 mcg [J3420] 2)  Admin of Therapeutic Inj  intramuscular or subcutaneous [96372]   Medication Administration  Injection # 1:    Medication: Vit B12 1000 mcg    Diagnosis: ANEMIA, B12 DEFICIENCY (ICD-281.1)    Route: IM    Site: R deltoid    Exp Date: 11/27/2010    Lot #: [id number]    Mfr: [employer]    Patient tolerated injection without complications    Given by: Gladis Riffle, RN (Jul 30, 2009 10:07 AM)  Orders Added: 1)  Vit B12 1000 mcg [J3420] 2)  Admin of Therapeutic Inj  intramuscular or subcutaneous [[id number]]

## 2010-04-29 NOTE — Progress Notes (Signed)
Summary: question re med/**LM/nm   Phone Note Call from Patient   Caller: Patient Reason for Call: Talk to Nurse Summary of Call: pt releasef from dr swords and was told to check with dr Juanda Chance to make sure he is on correct meds-pls call [phone] Initial call taken by: Glynda Jaeger,  November 16, 2009 3:57 PM  Follow-up for Phone Call        [facility]. Ollen Gross, RN, BSN  November 16, 2009 4:01 PM  Pt called back. Pt. states had heart surgery. His surgeon D/C him from his service and recommended for pt. to make a F/U appointment with his cardilogist. Marcelino Duster scheduler spoke with pt. She is aware to make an appointment for pt. with Dr. Juanda Chance.   Follow-up by: Ollen Gross, RN, BSN,  November 16, 2009 4:58 PM

## 2010-04-29 NOTE — Assessment & Plan Note (Signed)
Summary: BACK PAIN/FOR OPEN HEART MON/PS   Vital Signs:  Patient profile:   70 year old male Height:      65 inches (165.10 cm) Weight:      170 pounds (77.27 kg) Temp:     98.1 degrees F (36.72 degrees C) oral Pulse rate:   70 / minute BP sitting:   138 / 72  (left arm) Cuff size:   regular  Vitals Entered By: Josph Macho RMA (October 05, 2009 10:22 AM) CC: chest pain and left shoulder pain/ pt states he heard a pop in his ribs/ CF Is Patient Diabetic? No   Primary Care Provider:  Birdie Sons MD  CC:  chest pain and left shoulder pain/ pt states he heard a pop in his ribs/ CF.  History of Present Illness: ED visit yesterday--- reviewed records from Dr. Oletta Lamas has left sided shoulder pain---tender to touch onset Saturday after sneezing---long hx of osteoporosis and hx of rib fractures now has pain with cough , sneeze or palpation  All other systems reviewed and were negative   Current Medications (verified): 1)  Crestor 20 Mg Tabs (Rosuvastatin Calcium) .... 1/2 Every Bedtime 2)  Pantoprazole Sodium 40 Mg Tbec (Pantoprazole Sodium) .... Take 1 Tablet By Mouth Two Times A Day 3)  Tenormin 50 Mg Tabs (Atenolol) .... Take 2 Tablet By Mouth Daily As Directed By Physcian 4)  Triamcinolone Acetonide 0.025 % Oint (Triamcinolone Acetonide) .... Apply 1 A Small Amount To Affected Area Twice A Day 5)  Caltrate 600+d 600-400 Mg-Unit  Tabs (Calcium Carbonate-Vitamin D) .Marland Kitchen.. 1 Tab Once Daily 6)  Cvs Vitamin E 800 Unit  Caps (Vitamin E) .... Once Daily 7)  Aspirin Ec 325 Mg Tbec (Aspirin) .... Take One Tablet By Mouth Daily 8)  Norvasc 10 Mg  Tabs (Amlodipine Besylate) .... Take 1 Tablet By Mouth Once A Day 9)  Altace 10 Mg  Tabs (Ramipril) .... Take 1 Tablet By Mouth Once A Day 10)  Plavix 75 Mg  Tabs (Clopidogrel Bisulfate) .... Take 1 Tablet By Mouth Once A Day 11)  Spiriva Handihaler 18 Mcg  Caps (Tiotropium Bromide Monohydrate) .... One Inhaled Daily 12)  Ventolin Hfa 108 (90 Base)  Mcg/act Aers (Albuterol Sulfate) .... 2 Inh Q4h As Needed Shortness of Breath 13)  Vitamin B12 Shot .Marland Kitchen.. 1 Shot Monthly 14)  Oxycodone-Acetaminophen 5-500 Mg Caps (Oxycodone-Acetaminophen) .... Take 1-2 Every 4 Hours As Needed 15)  Forteo 600 Mcg/2.15ml Soln (Teriparatide (Recombinant)) .... Inject Daily As Directed 16)  Pen Needles 5/16" 31g X 8 Mm Misc (Insulin Pen Needle) .... Use Daily As Directed With Forteo 17)  Imdur 60 Mg Xr24h-Tab (Isosorbide Mononitrate) .... Take 1 Tablet Daily After Lunch 18)  Dicyclomine Hcl 20 Mg Tabs (Dicyclomine Hcl) .... Take One Daily 19)  Tamsulosin Hcl 0.4 Mg Caps (Tamsulosin Hcl) .... Take 1 Tablet By Mouth Once A Day 20)  Chantix 1 Mg Tabs (Varenicline Tartrate) .... Take One Tab By Mouth Once Daily 21)  Hydrocodone-Acetaminophen 5-500 Mg Tabs (Hydrocodone-Acetaminophen) .Marland Kitchen.. 1-2 Tabs Every 4-6 Hours As Needed  Allergies (verified): 1)  ! Ticlid  Past History:  Past Medical History: Last updated: 12/02/2008 Allergic rhinitis Osteoporosis 1. Coronary artery disease status post multiple percutaneous coronary     inventions with nonobstructive disease at last catheterization. 2. Peripheral vascular status post previous stenting of the totally     occluded iliac and status post stenting of an external iliac in     January 2009 with recurrent  symptoms of questionable claudication. 3. Hypertension. 4. Hyperlipidemia. 5. History of amaurosis fugax 1998. 6. Gastroesophageal reflux disease.  anemia, B12 deficiency  Past Surgical History: Last updated: 10/21/2008 PTCA/stent  04/06/00 Thyroidectomy, partial stent, coronary artery  03/07/01 cholecystectomy appendectomy hemorrhoidectomy Inguinal herniorrhaphy  10/10/08  Family History: Last updated: 08/31/08 father deceased MI-76yo Family History of CAD Male 1st degree relative <50 mother deceased age 68 yo Rheumatoid arthritis 2 brothers and a nephew with heart disease No FH of Colon  Cancer:  Social History: Last updated: August 31, 2008 Married-widowed Current Smoker Regular exercise-no Alcohol Use - yes Illicit Drug Use - no  Risk Factors: Exercise: no (12/18/2006)  Risk Factors: Smoking Status: current (02/11/2009) Packs/Day: 0.5 (02/11/2009)  Physical Exam  General:  patient is alert but in moderate pain with moving from chair to table. Head:  normocephalic and atraumatic.   Lungs:  normal respiratory effort and no intercostal retractions.   left sided lateral ribs tender to palpation   Impression & Recommendations:  Problem # 1:  CHEST PAIN UNSPECIFIED (ICD-786.50) suspect related to musculoskeletal pain - has osteoporosis---could have fractured rib, not seen on xray 9xrays reviewed) avoid nsaids --- upcoming surgery trial pain meds side effects discussed  Complete Medication List: 1)  Crestor 20 Mg Tabs (Rosuvastatin calcium) .... 1/2 every bedtime 2)  Pantoprazole Sodium 40 Mg Tbec (Pantoprazole sodium) .... Take 1 tablet by mouth two times a day 3)  Tenormin 50 Mg Tabs (Atenolol) .... Take 2 tablet by mouth daily as directed by physcian 4)  Triamcinolone Acetonide 0.025 % Oint (Triamcinolone acetonide) .... Apply 1 a small amount to affected area twice a day 5)  Caltrate 600+d 600-400 Mg-unit Tabs (Calcium carbonate-vitamin d) .Marland Kitchen.. 1 tab once daily 6)  Cvs Vitamin E 800 Unit Caps (Vitamin e) .... Once daily 7)  Aspirin Ec 325 Mg Tbec (Aspirin) .... Take one tablet by mouth daily 8)  Norvasc 10 Mg Tabs (Amlodipine besylate) .... Take 1 tablet by mouth once a day 9)  Altace 10 Mg Tabs (Ramipril) .... Take 1 tablet by mouth once a day 10)  Plavix 75 Mg Tabs (Clopidogrel bisulfate) .... Take 1 tablet by mouth once a day 11)  Spiriva Handihaler 18 Mcg Caps (Tiotropium bromide monohydrate) .... One inhaled daily 12)  Ventolin Hfa 108 (90 Base) Mcg/act Aers (Albuterol sulfate) .... 2 inh q4h as needed shortness of breath 13)  Vitamin B12 Shot  .Marland Kitchen.. 1  shot monthly 14)  Oxycodone-acetaminophen 5-500 Mg Caps (Oxycodone-acetaminophen) .... Take 1-2 every 4 hours as needed 15)  Forteo 600 Mcg/2.76ml Soln (Teriparatide (recombinant)) .... Inject daily as directed 16)  Pen Needles 5/16" 31g X 8 Mm Misc (Insulin pen needle) .... Use daily as directed with forteo 17)  Imdur 60 Mg Xr24h-tab (Isosorbide mononitrate) .... Take 1 tablet daily after lunch 18)  Dicyclomine Hcl 20 Mg Tabs (Dicyclomine hcl) .... Take one daily 19)  Tamsulosin Hcl 0.4 Mg Caps (Tamsulosin hcl) .... Take 1 tablet by mouth once a day 20)  Chantix 1 Mg Tabs (Varenicline tartrate) .... Take one tab by mouth once daily Prescriptions: OXYCODONE-ACETAMINOPHEN 5-500 MG CAPS (OXYCODONE-ACETAMINOPHEN) Take 1-2 every 4 hours as needed  #60 x 0   Entered and Authorized by:   Birdie Sons MD   Signed by:   Birdie Sons MD on 10/05/2009   Method used:   Print then Give to Patient   RxID:   [id number]

## 2010-04-29 NOTE — Progress Notes (Signed)
Summary: Nuc. Pre-Procedure  Phone Note Outgoing Call Call back at [facility] Phone [phone]   Call placed by: Irean Hong, RN,  February 01, 2010 2:02 PM Summary of Call: Reviewed information on My[employer] Information Sheet (see scanned document for further details).  Spoke with patient.     Nuclear Med Background Indications for Stress Test: Evaluation for Ischemia, Graft Patency, Stent Patency  Indications Comments: .  History: Angioplasty, CABG, COPD, Heart Catheterization, Myocardial Perfusion Study, Stents  History Comments: Multi- stents in past. 02/11/10 MPS: inferior infarct, no ischemia, EF=67%. 5/11Cath: multi vessel disease> CABG x5> AFIB post op.   Symptoms: Chest Tightness, DOE  Symptoms Comments: .   Nuclear Pre-Procedure Cardiac Risk Factors: Claudication, Family History - CAD, History of Smoking, Hypertension, Lipids, PVD Height (in): 65  Nuclear Med Study Referring MD:  Charlies Constable, MD

## 2010-04-29 NOTE — Assessment & Plan Note (Signed)
Summary: NP follow up - med calendar   Copy to:  Dr. Charlies Constable Primary Provider/Referring Provider:  Birdie Sons MD  CC:  new med calendar - pt brought all meds with him today.  pt states he finished augmentin 02-24-10, but still having chest congestion, hoarseness, and DOE and prod cough with yellow/white mucus.  History of Present Illness: 77 yowm quit smoking July 2011 at CABG and did fine in terms of improved ex tol > walk easily [place]  [place].   February 16, 2010  1st pulmonary office eval   cc sob x 3 weeks first noticed walking up incline then cough onset x 2 weeks with flu like symptoms with white mucus mucus then turned bloody x 24 hours with maybe 2 tbs of blood, no purulent or putrid sputum, with cxr 11/21 at [facility] suggesting  pna in lingula,  new since 11/3 so started on amox and referred to pulmonary clinic.  has mild sore throat, no choking or obvious asp, had dental work done about 2 weeks before onset of symptoms.   He has had intermittent  Left sided CP x years post on R intermittent related to rib fx from coughing. No recent change.  rec ct neg for pulmonary emboli or ca.  Stop altace Stop aspirin until no bleeding for 3 days  Stop amoxiicillin and taek augmentin 875 with bfeast and supper and yogurt for lunch Please schedule a follow-up appointment in 10 days, sooner if needed  with cxr on return Add pepcid 20 mg one at bedtime GERD (REFLUX) diet Late Add:  pft's c/w copd /ab and cxr's inconclusiive, need ct chest to complete the w/u return for sample of symbicort 160 2 puffs first thing  in am and 2 puffs again in pm about 12 hours   February 25, 2010 ov cough better, hasn't tried much outdoor activity yet, no more hemoptysis. >>d/c ace/norvasc, rx bystolic. tx w/ PPI/pepcid , xray showed Persistent patchy and nodular airspace disease in the left lung. March 11, 2010--Presents for follow up and med review. He has brought all his meds in today. He is  taking symbicort only once daily (not two times a day ) and has not started on pepcid at bedtime. Last ov changed off ace /norvasc now on bystolic. Cough is better w/ no further hemoptysis but not totally gone. Xray last ov showed persistent  patchy and nodular airspace disease in the left lung. Denies chest pain,  orthopnea, hemoptysis, fever, n/v/d, edema, headache,recent travel or antibiotics.    Preventive Screening-Counseling & Management  Alcohol-Tobacco     Smoking Status: quit  Medications Prior to Update: 1)  Crestor 20 Mg Tabs (Rosuvastatin Calcium) .... 1/2 Tab Once Daily 2)  Pantoprazole Sodium 40 Mg Tbec (Pantoprazole Sodium) .... Take  One 30-60 Min Before First Meal of The Day 3)  Caltrate 600+d 600-400 Mg-Unit  Tabs (Calcium Carbonate-Vitamin D) .Marland Kitchen.. 1 Tab Once Daily 4)  Aspirin 81 Mg  Tabs (Aspirin) .... Hold For 3 Days For Any Signs of Bleeding 5)  Spiriva Handihaler 18 Mcg  Caps (Tiotropium Bromide Monohydrate) .... One Inhaled Daily 6)  Ventolin Hfa 108 (90 Base) Mcg/act Aers (Albuterol Sulfate) .... 2 Inh Q4h As Needed Shortness of Breath 7)  Cyanocobalamin 1000 Mcg/ml Inj Soln (Cyanocobalamin) .... Inject 1 Cc Intramuscularly Monthly 8)  Forteo 600 Mcg/2.63ml Soln (Teriparatide (Recombinant)) .... Inject Daily As Directed 9)  Pen Needles 5/16" 31g X 8 Mm Misc (Insulin Pen Needle) .Marland KitchenMarland KitchenMarland Kitchen  Use Daily As Directed With Forteo 10)  Benadryl 25 Mg Tabs (Diphenhydramine Hcl) .Marland Kitchen.. 1 By Mouth Q 6 Hours As Needed Itching 11)  Furosemide 20 Mg Tabs (Furosemide) .... Take One Tablet By Mouth Daily. 12)  Mucinex 600 Mg Xr12h-Tab (Guaifenesin) .... Take One Tab By Mouth Two Times A Day For 1 Week.-Stopped Taking 13)  Augmentin 875-125 Mg  Tabs (Amoxicillin-Pot Clavulanate) .... One At Breakfast and One Supper and Culured Yogurt For Lunch 14)  Pepcid 20 Mg Tabs (Famotidine) .... Take One By Mouth At Bedtime 15)  Symbicort 160-4.5 Mcg/act Aero (Budesonide-Formoterol Fumarate) .... 2  Puffs Two Times A Day 16)  Bystolic 20 Mg Tabs (Nebivolol Hcl) .... One Half Daily  Allergies (verified): 1)  ! Ticlid  Past History:  Past Surgical History: Last updated: 11/06/2009 PTCA/stent  04/06/00 Thyroidectomy, partial stent, coronary artery  03/07/01 cholecystectomy appendectomy hemorrhoidectomy Inguinal herniorrhaphy  10/10/08 CABG 2011  Family History: Last updated: 15-Aug-2008 father deceased MI-49yo Family History of CAD Male 1st degree relative <50 mother deceased age 4 yo Rheumatoid arthritis 2 brothers and a nephew with heart disease No FH of Colon Cancer:  Social History: Last updated: 02/16/2010 Widowed Former smoker.  Quit July 2011.  Smoked approx 50 yrs up to 3 ppd Regular exercise-no Alcohol Use - yes Illicit Drug Use - no Retired from [employer] and [employer]  Risk Factors: Smoking Status: quit (03/11/2010) Packs/Day: 0.5 (11/06/2009)  Past Medical History: Allergic rhinitis Osteoporosis 1. Coronary artery disease status post multiple percutaneous coronary     inventions  > CABG 09/2009 2. Peripheral vascular status post previous stenting of the totally     occluded iliac and status post stenting of an external iliac in     January 2009 with recurrent symptoms of questionable claudication. 3. Hypertension. 4. Hyperlipidemia. 5. History of amaurosis fugax 1998. 6. Gastroesophageal reflux disease.  anemia, B12 deficiency AFIB--post CABG---amiodarone Atrial fibrillation COPD     - PFT's 11/3  FEV1 1.16 46%  ratio 44  with 27% improvement p B2     - HFA 50% p coaching February 25, 2010  Hemoptysis onset 01/2010     - CT chest 02/19/10 : no PE, no ca/ nodular infiltrates  COMPLEX MED REGIMEN Meds reviewed with pt education and computerized med calendar completed March 11, 2010   Social History: Smoking Status:  quit  Review of Systems      See HPI  Vital Signs:  Patient profile:   70 year old male Height:      65 inches Weight:       169.13 pounds BMI:     28.25 O2 Sat:      94 % on Room air Temp:     97.1 degrees F oral Pulse rate:   74 / minute BP sitting:   124 / 64  (left arm) Cuff size:   regular  Vitals Entered By: Boone Master CNA/MA (March 11, 2010 9:06 AM)  O2 Flow:  Room air CC: new med calendar - pt brought all meds with him today.  pt states he finished augmentin 02-24-10, but still having chest congestion, hoarseness, DOE and prod cough with yellow/white mucus Is Patient Diabetic? No Comments Medications reviewed with patient Daytime contact number verified with patient. Boone Master CNA/MA  March 11, 2010 9:01 AM    Physical Exam  Additional Exam:  pleasant amb wf  wt 167 February 16, 2010  > 166 February 25, 2010 > 169 March 11, 2010 HEENT:  nl dentition, turbinates, and orophanx. Nl external ear canals without cough reflex NECK :  without JVD/Nodes/TM/ nl carotid upstrokes bilaterally LUNGS: no acc muscle use, clear to A and P bilaterally without cough on insp or exp maneuvers CV:  RRR  no s3 or murmur or increase in P2, no edema  ABD:  soft and nontender with nl excursion in the supine position. No bruits or organomegaly, bowel sounds nl MS:  warm without deformities, calf tenderness, cyanosis or clubbing SKIN: warm and dry without lesions   NEURO:  alert, approp, no deficits     Impression & Recommendations:  Problem # 1:  COPD (ICD-496) Slow to resolve exacerbation with superimposed PNA, now finished abx.  Meds reviewed with pt education and computerized med calendar completed will check xray to docuemnt clearance if not cleared will need serial CT.   Plan;  I will call with chest xray results.  Follow med calenadar closley and bring to each visit.  add pepcid 20mg  at bedtime  Increase Symbicort 2 puffs two times a day  Brush/rinse/gargle after use.  follow up Dr. Sherene Sires in 4 weeks and as needed  Please contact office for sooner follow up if symptoms do not improve or  worsen   Complete Medication List: 1)  Crestor 20 Mg Tabs (Rosuvastatin calcium) .... 1/2 tab once daily 2)  Pantoprazole Sodium 40 Mg Tbec (Pantoprazole sodium) .... Take  one 30-60 min before first meal of the day 3)  Caltrate 600+d 600-400 Mg-unit Tabs (Calcium carbonate-vitamin d) .Marland Kitchen.. 1 tab once daily 4)  Aspirin 81 Mg Tabs (Aspirin) .... Hold for 3 days for any signs of bleeding 5)  Spiriva Handihaler 18 Mcg Caps (Tiotropium bromide monohydrate) .... One inhaled daily 6)  Ventolin Hfa 108 (90 Base) Mcg/act Aers (Albuterol sulfate) .... 2 inh q4h as needed shortness of breath 7)  Cyanocobalamin 1000 Mcg/ml Inj Soln (Cyanocobalamin) .... Inject 1 cc intramuscularly monthly 8)  Forteo 600 Mcg/2.33ml Soln (Teriparatide (recombinant)) .... Inject daily as directed 9)  Pen Needles 5/16" 31g X 8 Mm Misc (Insulin pen needle) .... Use daily as directed with forteo 10)  Benadryl 25 Mg Tabs (Diphenhydramine hcl) .Marland Kitchen.. 1 by mouth q 6 hours as needed itching 11)  Furosemide 20 Mg Tabs (Furosemide) .... Take one tablet by mouth daily. 12)  Mucinex 600 Mg Xr12h-tab (Guaifenesin) .... Take one tab by mouth two times a day for 1 week.-stopped taking 13)  Augmentin 875-125 Mg Tabs (Amoxicillin-pot clavulanate) .... One at breakfast and one supper and culured yogurt for lunch 14)  Pepcid 20 Mg Tabs (Famotidine) .... Take one by mouth at bedtime 15)  Symbicort 160-4.5 Mcg/act Aero (Budesonide-formoterol fumarate) .... 2 puffs two times a day 16)  Bystolic 20 Mg Tabs (Nebivolol hcl) .... One half daily  Other Orders: T-2 View CXR (71020TC) Est. Patient Level IV (16109)  Patient Instructions: 1)  I will call with chest xray results.  2)  Follow med calenadar closley and bring to each visit.  3)  add pepcid 20mg  at bedtime  4)  Increase Symbicort 2 puffs two times a day  5)  Brush/rinse/gargle after use.  6)  follow up Dr. Sherene Sires in 4 weeks and as needed  7)  Please contact office for sooner follow  up if symptoms do not improve or worsen     Appended Document: med calendar update Medications Added ASPIRIN 81 MG  TABS (ASPIRIN) Take 1 tablet by mouth once a day TRIAMCINOLONE ACETONIDE 0.1 %  CREA (TRIAMCINOLONE ACETONIDE) apply as needed TUMS 500 MG CHEW (CALCIUM CARBONATE ANTACID) as needed VENTOLIN HFA 108 (90 BASE) MCG/ACT AERS (ALBUTEROL SULFATE) inhale 2 puffs every 4hours as needed shortness of breath MUCINEX DM 30-600 MG XR12H-TAB (DEXTROMETHORPHAN-GUAIFENESIN) 1 two times a day as needed          Clinical Lists Changes  Medications: Changed medication from ASPIRIN 81 MG  TABS (ASPIRIN) hold for 3 days for any signs of bleeding to ASPIRIN 81 MG  TABS (ASPIRIN) Take 1 tablet by mouth once a day Added new medication of TRIAMCINOLONE ACETONIDE 0.1 % CREA (TRIAMCINOLONE ACETONIDE) apply as needed Added new medication of TUMS 500 MG CHEW (CALCIUM CARBONATE ANTACID) as needed Changed medication from VENTOLIN HFA 108 (90 BASE) MCG/ACT AERS (ALBUTEROL SULFATE) 2 inh q4h as needed shortness of breath to VENTOLIN HFA 108 (90 BASE) MCG/ACT AERS (ALBUTEROL SULFATE) inhale 2 puffs every 4hours as needed shortness of breath Changed medication from MUCINEX 600 MG XR12H-TAB (GUAIFENESIN) take one tab by mouth two times a day for 1 week.-STOPPED TAKING to MUCINEX DM 30-600 MG XR12H-TAB (DEXTROMETHORPHAN-GUAIFENESIN) 1 two times a day as needed Removed medication of BENADRYL 25 MG TABS (DIPHENHYDRAMINE HCL) 1 by mouth q 6 hours as needed itching Removed medication of AUGMENTIN 875-125 MG  TABS (AMOXICILLIN-POT CLAVULANATE) one at breakfast and one supper and culured yogurt for lunch

## 2010-04-29 NOTE — Progress Notes (Signed)
Summary: Call-A-Nurse Report    Call-A-Nurse Triage Call Report Triage Record Num: [id number] Operator: Migdalia Dk Patient Name: [name] Call Date & Time: 10/04/2009 7:56:42AM Patient Phone: PCP: Patient Gender: Male PCP Fax : Patient DOB: 1940/10/11 Practice Name: [facility] Reason for Call: Pt. states, "I coughed and sneezed and felt something pop and now I am having pain in my left shoulderblade and its like something sticking through me." Pain with movement and deep breath, "if I sit in my recliner and lean back it feels like something sticking through me." Cough with production of white sputum, mild SOB. Pt. to go to ER for evaluation. Protocol(s) Used: Chest Pain / Discomfort Recommended Outcome per Protocol: See Provider within 4 hours Reason for Outcome: Moderate to severe pain occurring with deep breath or a productive cough for one full day or more Care Advice:  ~ 10/04/2009 8:02:36AM Page 1 of 1 [email or web address]

## 2010-04-29 NOTE — Progress Notes (Signed)
Summary: GI upset   Phone Note Outgoing Call   Call placed by: Sherri Rad, RN, BSN,  June 26, 2009 1:42 PM Call placed to: Patient Summary of Call: I called the pt to give him his lab results. Per the pt, he c/o not sleeping well last night. He states that for the past 2 days he has had c/o GI upset and diarrhea. His only new medication is Ranexa. In looking up side effects for this particular medication, constipation is listed as a possible adverse reaction, but not diarrhea. I discussed this with Dr. Juanda Chance and per his orders, the pt may hold Ranexa until symptoms resolve, then he can restart it to see if symptoms reoccur. The pt is aware of this and agreeable.  Initial call taken by: Sherri Rad, RN, BSN,  June 26, 2009 1:45 PM

## 2010-04-29 NOTE — Assessment & Plan Note (Signed)
Summary: fup---b12 inj//ccm   Vital Signs:  Patient profile:   70 year old male Weight:      166 pounds Temp:     98.5 degrees F oral Pulse rate:   64 / minute Pulse rhythm:   regular BP sitting:   154 / 84  (left arm) Cuff size:   regular  Vitals Entered By: Alfred Levins, CMA (March 05, 2010 8:58 AM)  Serial Vital Signs/Assessments:  Time      Position  BP       Pulse  Resp  Temp     By                     132/80                         Birdie Sons MD  CC: f/u   Primary Care Provider:  Birdie Sons MD  CC:  f/u.  History of Present Illness:  Follow-Up Visit      This is a 70 year old man who presents for Follow-up visit.  The patient denies chest pain and palpitations.  Since the last visit the patient notes no new problems or concerns.  The patient reports taking meds as prescribed.  When questioned about possible medication side effects, the patient notes none.    All other systems reviewed and were negative   Current Problems (verified): 1)  Atrial Fibrillation  (ICD-427.31) 2)  Peripheral Vascular Disease  (ICD-443.9) 3)  Carotid Artery Stenosis  (ICD-433.10) 4)  Anemia, B12 Deficiency  (ICD-281.1) 5)  Gi Bleeding  (ICD-578.9) 6)  Diverticular Disease  (ICD-562.10) 7)  Colonic Polyps  (ICD-211.3) 8)  COPD  (ICD-496) 9)  Hiatal Hernia  (ICD-553.3) 10)  Nephrolithiasis  (ICD-592.0) 11)  Tobacco Use  (ICD-305.1) 12)  Osteoporosis  (ICD-733.00) 13)  Myocardial Infarction, Hx of  (ICD-412) 14)  Hypertension  (ICD-401.9) 15)  Hyperlipidemia  (ICD-272.4) 16)  Coronary Artery Disease  (ICD-414.00)  Current Medications (verified): 1)  Crestor 20 Mg Tabs (Rosuvastatin Calcium) .... 1/2 Tab Once Daily 2)  Pantoprazole Sodium 40 Mg Tbec (Pantoprazole Sodium) .... Take  One 30-60 Min Before First Meal of The Day 3)  Caltrate 600+d 600-400 Mg-Unit  Tabs (Calcium Carbonate-Vitamin D) .Marland Kitchen.. 1 Tab Once Daily 4)  Aspirin 81 Mg  Tabs (Aspirin) .... Hold For 3  Days For Any Signs of Bleeding 5)  Spiriva Handihaler 18 Mcg  Caps (Tiotropium Bromide Monohydrate) .... One Inhaled Daily 6)  Ventolin Hfa 108 (90 Base) Mcg/act Aers (Albuterol Sulfate) .... 2 Inh Q4h As Needed Shortness of Breath 7)  Cyanocobalamin 1000 Mcg/ml Inj Soln (Cyanocobalamin) .... Inject 1 Cc Intramuscularly Monthly 8)  Forteo 600 Mcg/2.47ml Soln (Teriparatide (Recombinant)) .... Inject Daily As Directed 9)  Pen Needles 5/16" 31g X 8 Mm Misc (Insulin Pen Needle) .... Use Daily As Directed With Forteo 10)  Benadryl 25 Mg Tabs (Diphenhydramine Hcl) .Marland Kitchen.. 1 By Mouth Q 6 Hours As Needed Itching 11)  Furosemide 20 Mg Tabs (Furosemide) .... Take One Tablet By Mouth Daily. 12)  Mucinex 600 Mg Xr12h-Tab (Guaifenesin) .... Take One Tab By Mouth Two Times A Day For 1 Week.-Stopped Taking 13)  Augmentin 875-125 Mg  Tabs (Amoxicillin-Pot Clavulanate) .... One At Breakfast and One Supper and Culured Yogurt For Lunch 14)  Pepcid 20 Mg Tabs (Famotidine) .... Take One By Mouth At Bedtime 15)  Symbicort 160-4.5 Mcg/act Aero (Budesonide-Formoterol Fumarate) .Marland KitchenMarland KitchenMarland Kitchen  2 Puffs Two Times A Day 16)  Bystolic 20 Mg Tabs (Nebivolol Hcl) .... One Half Daily  Allergies (verified): 1)  ! Ticlid  Past History:  Past Medical History: Last updated: 02/25/2010 Allergic rhinitis Osteoporosis 1. Coronary artery disease status post multiple percutaneous coronary     inventions  > CABG 09/2009 2. Peripheral vascular status post previous stenting of the totally     occluded iliac and status post stenting of an external iliac in     January 2009 with recurrent symptoms of questionable claudication. 3. Hypertension. 4. Hyperlipidemia. 5. History of amaurosis fugax 1998. 6. Gastroesophageal reflux disease.  anemia, B12 deficiency AFIB--post CABG---amiodarone Atrial fibrillation COPD     - PFT's 11/3  FEV1 1.16 46%  ratio 44  with 27% improvement p B2     - HFA 50% p coaching February 25, 2010  Hemoptysis  onset 01/2010     - CT chest 02/19/10 : no PE, no ca/ nodular infiltrates   Past Surgical History: Last updated: 11/06/2009 PTCA/stent  04/06/00 Thyroidectomy, partial stent, coronary artery  03/07/01 cholecystectomy appendectomy hemorrhoidectomy Inguinal herniorrhaphy  10/10/08 CABG 2011  Family History: Last updated: 08-29-2008 father deceased MI-8yo Family History of CAD Male 1st degree relative <50 mother deceased age 13 yo Rheumatoid arthritis 2 brothers and a nephew with heart disease No FH of Colon Cancer:  Social History: Last updated: 02/16/2010 Widowed Former smoker.  Quit July 2011.  Smoked approx 50 yrs up to 3 ppd Regular exercise-no Alcohol Use - yes Illicit Drug Use - no Retired from [employer] and [employer]  Risk Factors: Exercise: no (11/06/2009)  Risk Factors: Smoking Status: current (11/06/2009) Packs/Day: 0.5 (11/06/2009)  Physical Exam  General:  well-developed male in no acute distress. HEENT exam atraumatic, stork symmetric muscles are intact. Neck is supple. Chest clear to auscultation cardiac exam S1-S2 are regular. Abdominal exam it bowel sounds, soft her extremities no clubbing cyanosis or edema.   Impression & Recommendations:  Problem # 1:  ATRIAL FIBRILLATION (ICD-427.31) no known recurrence His updated medication list for this problem includes:    Aspirin 81 Mg Tabs (Aspirin) ..... Hold for 3 days for any signs of bleeding    Bystolic 20 Mg Tabs (Nebivolol hcl) ..... One half daily  Problem # 2:  HYPERTENSION (ICD-401.9) home BPs well controlled continue current medications  reviewed dr. wert's note (currently off ACE-I) His updated medication list for this problem includes:    Furosemide 20 Mg Tabs (Furosemide) .Marland Kitchen... Take one tablet by mouth daily.    Bystolic 20 Mg Tabs (Nebivolol hcl) ..... One half daily  BP today: 154/84 Prior BP: 114/70 (02/25/2010)  Labs Reviewed: K+: 5.0 (02/02/2010) Creat: : 1.0 (02/02/2010)   Chol: 134  (12/04/2009)   HDL: 34.00 (12/04/2009)   LDL: 80 (12/04/2009)   TG: 101.0 (12/04/2009)  Problem # 3:  COPD (ICD-496) follwed by dr. Sherene Sires His updated medication list for this problem includes:    Spiriva Handihaler 18 Mcg Caps (Tiotropium bromide monohydrate) ..... One inhaled daily    Symbicort 160-4.5 Mcg/act Aero (Budesonide-formoterol fumarate) .Marland Kitchen... 2 puffs two times a day    Ventolin Hfa 108 (90 Base) Mcg/act Aers (Albuterol sulfate) .Marland Kitchen... 2 inh q4h as needed shortness of breath  Pulmonary Functions Reviewed: O2 sat: 95 (02/25/2010)     Vaccines Reviewed: Pneumovax: Historical (12/26/2001)   Flu Vax: Fluvax 3+ (12/04/2009)  Problem # 4:  ANEMIA, IRON DEFICIENCY (ICD-280.9) resolved---will remove from problem list His updated medication list for this  problem includes:    Cyanocobalamin 1000 Mcg/ml Inj Soln (Cyanocobalamin) ..... Inject 1 cc intramuscularly monthly  Hgb: 15.2 (01/27/2010)   Hct: 45.5 (01/27/2010)   Platelets: 241.0 (01/27/2010) RBC: 5.01 (01/27/2010)   RDW: 16.0 (01/27/2010)   WBC: 8.4 (01/27/2010) MCV: 90.7 (01/27/2010)   MCHC: 33.5 (01/27/2010) Ferritin: 11.4 (07/30/2008) Iron: 43 (07/30/2008)   % Sat: 8.0 (07/30/2008) B12: 189 (07/30/2008)   TSH: 2.66 (01/27/2010)  Problem # 5:  TOBACCO USE (ICD-305.1) none since JULY Encouraged smoking cessation and discussed different methods for smoking cessation.   Complete Medication List: 1)  Crestor 20 Mg Tabs (Rosuvastatin calcium) .... 1/2 tab once daily 2)  Furosemide 20 Mg Tabs (Furosemide) .... Take one tablet by mouth daily. 3)  Pantoprazole Sodium 40 Mg Tbec (Pantoprazole sodium) .... Take  one 30-60 min before first meal of the day 4)  Aspirin 81 Mg Tabs (Aspirin) .... Hold for 3 days for any signs of bleeding 5)  Bystolic 20 Mg Tabs (Nebivolol hcl) .... One half daily 6)  Spiriva Handihaler 18 Mcg Caps (Tiotropium bromide monohydrate) .... One inhaled daily 7)  Symbicort 160-4.5 Mcg/act Aero  (Budesonide-formoterol fumarate) .... 2 puffs two times a day 8)  Caltrate 600+d 600-400 Mg-unit Tabs (Calcium carbonate-vitamin d) .Marland Kitchen.. 1 tab once daily 9)  Forteo 600 Mcg/2.75ml Soln (Teriparatide (recombinant)) .... Inject daily as directed 10)  Cyanocobalamin 1000 Mcg/ml Inj Soln (Cyanocobalamin) .... Inject 1 cc intramuscularly monthly 11)  Pepcid 20 Mg Tabs (Famotidine) .... Take one by mouth at bedtime 12)  Ventolin Hfa 108 (90 Base) Mcg/act Aers (Albuterol sulfate) .... 2 inh q4h as needed shortness of breath 13)  Mucinex 600 Mg Xr12h-tab (Guaifenesin) .... Take one tab by mouth two times a day for 1 week.-stopped taking 14)  Pen Needles 5/16" 31g X 8 Mm Misc (Insulin pen needle) .... Use daily as directed with forteo 15)  Benadryl 25 Mg Tabs (Diphenhydramine hcl) .Marland Kitchen.. 1 by mouth q 6 hours as needed itching 16)  Augmentin 875-125 Mg Tabs (Amoxicillin-pot clavulanate) .... One at breakfast and one supper and culured yogurt for lunch  Patient Instructions: 1)  Please schedule a follow-up appointment in 2 months.   Orders Added: 1)  Est. Patient Level IV [[address]]

## 2010-04-29 NOTE — Assessment & Plan Note (Signed)
Summary: b12 inj//lch 130PM   Nurse Visit   Allergies: 1)  ! Ticlid  Medication Administration  Injection # 1:    Medication: Vit B12 1000 mcg    Diagnosis: ANEMIA, B12 DEFICIENCY (ICD-281.1)    Route: SQ    Site: L deltoid    Exp Date: 12/29/2009    Lot #: [id number]    Mfr: [employer] Regent    Patient tolerated injection without complications    Given by: Lynann Beaver CMA (June 03, 2009 11:31 AM)  Orders Added: 1)  Admin of Therapeutic Inj  intramuscular or subcutaneous [[id number]]

## 2010-04-29 NOTE — Progress Notes (Signed)
Summary: question  Phone Note Call from Patient Call back at Home Phone [phone]   Caller: Patient in person Call For: Birdie Sons MD Summary of Call: Wants ok to stop forteo while in hospital 5-7 days for 5 bypasses.  Has just gotten  Rx and insurance will not pay for more in hospital nor can he take his own. Initial call taken by: Gladis Riffle, RN,  October 01, 2009 10:41 AM  Follow-up for Phone Call        ok to stop Follow-up by: Birdie Sons MD,  October 01, 2009 4:28 PM  Additional Follow-up for Phone Call Additional follow up Details #1::        spoke with patient Additional Follow-up by: Kern Reap CMA Duncan Dull),  October 01, 2009 4:37 PM

## 2010-04-29 NOTE — Cardiovascular Report (Signed)
Summary: Cath/Percutaneous Orders  Cath/Percutaneous Orders   Imported By: Roderic Ovens 08/20/2009 12:41:01  _____________________________________________________________________  External Attachment:    Type:   Image     Comment:   External Document

## 2010-04-29 NOTE — Assessment & Plan Note (Signed)
Summary: B-12INJ/RCD   Nurse Visit   Allergies: 1)  ! Ticlid  Medication Administration  Injection # 1:    Medication: Vit B12 1000 mcg    Diagnosis: ANEMIA, B12 DEFICIENCY (ICD-281.1)    Route: IM    Site: L deltoid    Exp Date: 11/27/2010    Lot #: [id number]    Mfr: [employer] Regent    Patient tolerated injection without complications    Given by: Gladis Riffle, RN (October 01, 2009 10:39 AM)  Orders Added: 1)  Vit B12 1000 mcg [J3420] 2)  Admin of Therapeutic Inj  intramuscular or subcutaneous [[id number]]   Medication Administration  Injection # 1:    Medication: Vit B12 1000 mcg    Diagnosis: ANEMIA, B12 DEFICIENCY (ICD-281.1)    Route: IM    Site: L deltoid    Exp Date: 11/27/2010    Lot #: [id number]    Mfr: American Regent    Patient tolerated injection without complications    Given by: Gladis Riffle, RN (October 01, 2009 10:39 AM)  Orders Added: 1)  Vit B12 1000 mcg [J3420] 2)  Admin of Therapeutic Inj  intramuscular or subcutaneous [[id number]]

## 2010-04-29 NOTE — Progress Notes (Signed)
Summary: results  Phone Note Call from Patient Call back at Home Phone [phone]   Caller: Patient Call For: tammy parrett Reason for Call: Talk to Nurse Summary of Call: Patient returning call about xray results. Initial call taken by: Lehman Prom,  March 15, 2010 8:58 AM  Follow-up for Phone Call        Pt informed of cxr results. Abigail Miyamoto RN  March 15, 2010 9:36 AM

## 2010-04-29 NOTE — Cardiovascular Report (Signed)
Summary: Pre Cath Orders  Pre Cath Orders   Imported By: Roderic Ovens 04/21/2009 13:17:02  _____________________________________________________________________  External Attachment:    Type:   Image     Comment:   External Document

## 2010-04-29 NOTE — Miscellaneous (Signed)
  Clinical Lists Changes  Observations: Added new observation of CARDCATHFIND:  1. Coronary artery disease status post prior coronary bypass graft       surgery.   2. Severe native vessel disease with 30% narrowing in the proximal LAD       and 90% narrowing in the first small diagonal branch, 70% narrowing       in the ramus branch of circumflex artery and 80% and 90% stenosis       in the proximal and midportion of the circumflex artery within the       previously placed stent, and total occlusion of the right coronary       artery.   3. Patent LIMA graft to the ramus branch of the circumflex artery, and       occluded vein graft to the right coronary by prior study.   4. Good LV function with inferior wall hypokinesis and estimated       ejection fraction of 60%.      RECOMMENDATIONS:  The patient's coronary anatomy has not changed much   over the last several years.  We will plan continued medical management.   Her hemoglobin A1c is 10 and we will have [facility] to   see her about helping manage her diabetes.     (04/01/2009 10:18)      Cardiac Cath  Procedure date:  04/01/2009  Findings:       1. Coronary artery disease status post prior coronary bypass graft       surgery.   2. Severe native vessel disease with 30% narrowing in the proximal LAD       and 90% narrowing in the first small diagonal branch, 70% narrowing       in the ramus branch of circumflex artery and 80% and 90% stenosis       in the proximal and midportion of the circumflex artery within the       previously placed stent, and total occlusion of the right coronary       artery.   3. Patent LIMA graft to the ramus branch of the circumflex artery, and       occluded vein graft to the right coronary by prior study.   4. Good LV function with inferior wall hypokinesis and estimated       ejection fraction of 60%.      RECOMMENDATIONS:  The patient's coronary anatomy has not changed much     over the last several years.  We will plan continued medical management.   Her hemoglobin A1c is 10 and we will have [facility] to   see her about helping manage her diabetes.

## 2010-04-29 NOTE — Assessment & Plan Note (Signed)
Summary: per check out  Medications Added FUROSEMIDE 20 MG TABS (FUROSEMIDE) Take one tablet by mouth daily.        Visit Type:  Follow-up Primary Provider:  Birdie Sons MD  CC:  chest pain, sob, and increase heart rate.  History of Present Illness: [name] is 70 years old and came today for an unscheduled visit because of increasing shortness of breath. He has a long history of CAD and PCI procedures and underwent bypass surgery in July of 2011. Postoperatively he developed a pulmonary infection and paroxysmal atrial fibrillation. He finally improved, but 2 weeks ago he says he developed shortness of breath with exertion which has been fairly profound. He says he can only walk a short distance before he gets profoundly short of breath. He does have some wheezing with this. He says his oxygen saturations have been about 92 with this. He has had some associated chest pain with this she describes a left-sided tightness.  He has had some associated cough and wheezing.  Current Medications (verified): 1)  Crestor 20 Mg Tabs (Rosuvastatin Calcium) .... 1/2 Every Bedtime 2)  Pantoprazole Sodium 40 Mg Tbec (Pantoprazole Sodium) .... Take 1 Tablet By Mouth Once A Day 3)  Caltrate 600+d 600-400 Mg-Unit  Tabs (Calcium Carbonate-Vitamin D) .Marland Kitchen.. 1 Tab Once Daily 4)  Aspirin 81 Mg  Tabs (Aspirin) 5)  Norvasc 10 Mg  Tabs (Amlodipine Besylate) .... Take 1 Tablet By Mouth Once A Day 6)  Spiriva Handihaler 18 Mcg  Caps (Tiotropium Bromide Monohydrate) .... One Inhaled Daily 7)  Ventolin Hfa 108 (90 Base) Mcg/act Aers (Albuterol Sulfate) .... 2 Inh Q4h As Needed Shortness of Breath 8)  Cyanocobalamin 1000 Mcg/ml Inj Soln (Cyanocobalamin) .... Inject 1 Cc Intramuscularly Monthly 9)  Forteo 600 Mcg/2.31ml Soln (Teriparatide (Recombinant)) .... Inject Daily As Directed 10)  Pen Needles 5/16" 31g X 8 Mm Misc (Insulin Pen Needle) .... Use Daily As Directed With Forteo 11)  Ferrous Gluconate 324 Mg Tabs  (Ferrous Gluconate) .Marland Kitchen.. 1 By Mouth Two Times A Day 12)  Benadryl 25 Mg Tabs (Diphenhydramine Hcl) .Marland Kitchen.. 1 By Mouth Q 6 Hours As Needed Itching 13)  Altace 10 Mg Caps (Ramipril) .Marland Kitchen.. 1 By Mouth Once Daily  Allergies: 1)  ! Ticlid  Past History:  Past Medical History: Reviewed history from 11/06/2009 and no changes required. Allergic rhinitis Osteoporosis 1. Coronary artery disease status post multiple percutaneous coronary     inventions with nonobstructive disease at last catheterization. 2. Peripheral vascular status post previous stenting of the totally     occluded iliac and status post stenting of an external iliac in     January 2009 with recurrent symptoms of questionable claudication. 3. Hypertension. 4. Hyperlipidemia. 5. History of amaurosis fugax 1998. 6. Gastroesophageal reflux disease.  anemia, B12 deficiency AFIB--post CABG---amiodarone Atrial fibrillation  Review of Systems       ROS is negative except as outlined in HPI.   Vital Signs:  Patient profile:   70 year old male Height:      65 inches Weight:      168 pounds Pulse rate:   100 / minute Pulse rhythm:   regular BP sitting:   130 / 82  (left arm)  Vitals Entered By: Jacquelin Hawking, CMA (January 27, 2010 10:02 AM)  Physical Exam  Additional Exam:  Gen. Well-nourished, in no distress   Neck: JVD 1 cm above the clavicle, thyroid not enlarged, no carotid bruits Lungs: No tachypnea, few scattered  wheezes Cardiovascular: Rhythm regular, PMI not displaced,  heart sounds  normal, no murmurs or gallops, trace to 1+ bilateral peripheral edema, pulses normal in all 4 extremities. Abdomen: BS normal, abdomen soft and non-tender without masses or organomegaly, no hepatosplenomegaly. MS: No deformities, no cyanosis or clubbing   Neuro:  No focal sns   Skin:  no lesions    Impression & Recommendations:  Problem # 1:  DYSPNEA (ICD-786.05)  He has profound dyspnea for the past 2 weeks. I not certain about  the etiology. I think he does have some mild volume overload but I don't think this is likely the explanation for his shortness of breath. We will get laboratory studies today and will schedule him for a Myoview and pulmonary function tests and also him back in 2-3 weeks.  ECG showed an old anterior MI and lateral T-wave changes with little change from previous ECGs.  His updated medication list for this problem includes:    Aspirin 81 Mg Tabs (Aspirin)    Norvasc 10 Mg Tabs (Amlodipine besylate) .Marland Kitchen... Take 1 tablet by mouth once a day    Altace 10 Mg Caps (Ramipril) .Marland Kitchen... 1 by mouth once daily    Furosemide 20 Mg Tabs (Furosemide) .Marland Kitchen... Take one tablet by mouth daily.  Problem # 2:  CORONARY ARTERY DISEASE (ICD-414.00) He had bypass surgery in July of 2011. His dyspnea possibly could be an anginal equivalent and he does have some associated chest pain. We plan evaluation with a Myoview as above. His updated medication list for this problem includes:    Aspirin 81 Mg Tabs (Aspirin)    Norvasc 10 Mg Tabs (Amlodipine besylate) .Marland Kitchen... Take 1 tablet by mouth once a day    Altace 10 Mg Caps (Ramipril) .Marland Kitchen... 1 by mouth once daily  Problem # 3:  TOBACCO USE (ICD-305.1) He has been able to stay off smoking since his surgery.  Problem # 4:  HYPERTENSION (ICD-401.9)  This is controlled on current medications. His updated medication list for this problem includes:    Aspirin 81 Mg Tabs (Aspirin)    Norvasc 10 Mg Tabs (Amlodipine besylate) .Marland Kitchen... Take 1 tablet by mouth once a day    Altace 10 Mg Caps (Ramipril) .Marland Kitchen... 1 by mouth once daily    Furosemide 20 Mg Tabs (Furosemide) .Marland Kitchen... Take one tablet by mouth daily.  His updated medication list for this problem includes:    Aspirin 81 Mg Tabs (Aspirin)    Norvasc 10 Mg Tabs (Amlodipine besylate) .Marland Kitchen... Take 1 tablet by mouth once a day    Altace 10 Mg Caps (Ramipril) .Marland Kitchen... 1 by mouth once daily    Furosemide 20 Mg Tabs (Furosemide) .Marland Kitchen... Take one  tablet by mouth daily.  Other Orders: EKG w/ Interpretation (93000) Nuclear Stress Test (Nuc Stress Test) Pulmonary Function Test (PFT) TLB-BMP (Basic Metabolic Panel-BMET) (80048-METABOL) TLB-CBC Platelet - w/Differential (85025-CBCD) TLB-BNP (B-Natriuretic Peptide) (83880-BNPR) TLB-TSH (Thyroid Stimulating Hormone) (84443-TSH) T-2 View CXR (71020TC)  Patient Instructions: 1)  Labwork today: bmet/cbc/tsh/bnp (786.05). 2)  Labwork in 1 week: bmet (786.05). 3)  A chest x-ray takes a picture of the organs and structures inside the chest, including the heart, lungs, and blood vessels. This test can show several things, including, whether the heart is enlarged; whether fluid is building up in the lungs; and whether pacemaker / defibrillator leads are still in place. 4)  Your physician has requested that you have a Lexiscan myoview.  For further information please visit [email or web address]  Please  follow instruction sheet, as given. 5)  Your physician has recommended that you have a pulmonary function test.  Pulmonary Function Tests are a group of tests that measure how well air moves in and out of your lungs. 6)  Your physician recommends that you schedule a follow-up appointment in: 2-3 weeks. 7)  Start Lasix (furosemide) 20mg  once daily. Prescriptions: FUROSEMIDE 20 MG TABS (FUROSEMIDE) Take one tablet by mouth daily.  #30 x 11   Entered by:   Sherri Rad, RN, BSN   Authorized by:   Lenoria Farrier, MD, [facility]   Signed by:   Sherri Rad, RN, BSN on 01/27/2010   Method used:   Electronically to        [facility]  [place] [address] (retail)       [address]       [place], [place]  [address]       Ph: [phone] or [phone]       Fax: [phone]   RxID:   [id number]

## 2010-04-29 NOTE — Assessment & Plan Note (Signed)
Summary: B12 INJ//LH   Nurse Visit   Allergies: 1)  ! Ticlid  Medication Administration  Injection # 1:    Medication: Vit B12 1000 mcg    Diagnosis: ANEMIA, B12 DEFICIENCY (ICD-281.1)    Route: IM    Site: R deltoid    Exp Date: 10/27/2010    Lot #: [id number]    Mfr: [employer] Regent    Patient tolerated injection without complications    Given by: Gladis Riffle, RN (April 06, 2009 9:10 AM)  Orders Added: 1)  Vit B12 1000 mcg [J3420] 2)  Admin of Therapeutic Inj  intramuscular or subcutaneous [[id number]]   Medication Administration  Injection # 1:    Medication: Vit B12 1000 mcg    Diagnosis: ANEMIA, B12 DEFICIENCY (ICD-281.1)    Route: IM    Site: R deltoid    Exp Date: 10/27/2010    Lot #: [id number]    Mfr: [employer] Regent    Patient tolerated injection without complications    Given by: Gladis Riffle, RN (April 06, 2009 9:10 AM)  Orders Added: 1)  Vit B12 1000 mcg [J3420] 2)  Admin of Therapeutic Inj  intramuscular or subcutaneous [[id number]]

## 2010-04-29 NOTE — Assessment & Plan Note (Signed)
Summary: B-12 inj//alp   Nurse Visit   Allergies: 1)  ! Ticlid  Medication Administration  Injection # 1:    Medication: Vit B12 1000 mcg    Diagnosis: ANEMIA, B12 DEFICIENCY (ICD-281.1)    Route: IM    Site: R deltoid    Exp Date: 04/29/2011    Lot #: [id number]    Mfr: American Regent    Patient tolerated injection without complications    Given by: Gladis Riffle, RN (November 02, 2009 9:11 AM)  Orders Added: 1)  Vit B12 1000 mcg [J3420] 2)  Admin of Therapeutic Inj  intramuscular or subcutaneous [[address]]   Medication Administration  Injection # 1:    Medication: Vit B12 1000 mcg    Diagnosis: ANEMIA, B12 DEFICIENCY (ICD-281.1)    Route: IM    Site: R deltoid    Exp Date: 04/29/2011    Lot #: [id number]    Mfr: American Regent    Patient tolerated injection without complications    Given by: Gladis Riffle, RN (November 02, 2009 9:11 AM)  Orders Added: 1)  Vit B12 1000 mcg [J3420] 2)  Admin of Therapeutic Inj  intramuscular or subcutaneous [[id number]]

## 2010-04-29 NOTE — Assessment & Plan Note (Signed)
Summary: b12 inj/cb   Nurse Visit   Allergies: 1)  ! Ticlid  Medication Administration  Injection # 1:    Medication: Vit B12 1000 mcg    Diagnosis: ANEMIA, B12 DEFICIENCY (ICD-281.1)    Route: IM    Site: R deltoid    Exp Date: 7/13    Lot #: [id number]    Mfr: American Regent    Patient tolerated injection without complications    Given by: Alfred Levins, CMA (April 12, 2010 7:46 AM)  Orders Added: 1)  Vit B12 1000 mcg [J3420] 2)  Admin of Therapeutic Inj  intramuscular or subcutaneous [[id number]]

## 2010-04-29 NOTE — Progress Notes (Signed)
Summary: Nuclear Pre-Procedure  Phone Note Outgoing Call Call back at [facility] Phone [phone]   Call placed by: Stanton Kidney, EMT-P,  April 06, 2009 1:34 PM Call placed to: Patient Action Taken: Phone Call Completed Summary of Call: Reviewed information on Myoview Information Sheet (see scanned document for further details).  Spoke with Patient. Also advised cell # [phone], if needed.    Nuclear Med Background Indications for Stress Test: Evaluation for Ischemia, Stent Patency  Indications Comments: .  History: COPD, Heart Catheterization, Myocardial Perfusion Study, Stents  History Comments: Multi- stents in past 08/12/08 MPS: EF=69%, sm. inferior infarct, (-) ischemia 04/01/09 Heart Cath: EF= 65%,patent stents, N/O CAD  Symptoms: Chest Pain, Chest Tightness with Exertion, DOE, SOB    Nuclear Pre-Procedure Cardiac Risk Factors: Claudication, Family History - CAD, Hypertension, Lipids, Overweight, PVD, Smoker Height (in): 66  Nuclear Med Study Referring MD:  B. Brodie     Appended Document: Nuclear Pre-Procedure The patient rescheduled myoview to 04/13/09 at 7:45 AM due to bad weather. P. Edwards,RN.

## 2010-04-29 NOTE — Medication Information (Signed)
Summary: Status Update for Forteo/Forteo Connect  Status Update for Forteo/Forteo Connect   Imported By: Sherian Rein 05/28/2009 12:22:52  _____________________________________________________________________  External Attachment:    Type:   Image     Comment:   External Document

## 2010-04-29 NOTE — Assessment & Plan Note (Signed)
Summary: 3wk f/u   Medications Added CRESTOR 20 MG TABS (ROSUVASTATIN CALCIUM) 1/2 tab once daily PREDNISONE 20 MG TABS (PREDNISONE) 2  tabs by mouth daily for 3 days, then 1 tab by mouth daily for 3 days, then  1/2 tab by mouth  daily for 3 days,  then stop. AMOXICILLIN 500 MG CAPS (AMOXICILLIN) take one tab by mouth three times a day for 5 days. MUCINEX 600 MG XR12H-TAB (GUAIFENESIN) take one tab by mouth two times a day for 1 week.        Visit Type:  Follow-up Primary Burech Mcfarland:  Birdie Sons MD   History of Present Illness: [name] is 70 years old and came today for followup evaluation of shortness of breath. He has a long history of CAD and PCI procedures and underwent bypass surgery in July of 2011. Postoperatively he developed a pulmonary infection and paroxysmal atrial fibrillation. He finally improved, but 2 weeks ago he says he developed shortness of breath with exertion which has been fairly profound. He says he can only walk a short distance before he gets profoundly short of breath. He does have some wheezing with this. He says his oxygen saturations have been about 92 with this. He has had some associated chest pain with this she describes a left-sided tightness.  We did a Myoview which was negative with an ejection fraction of 65%. He had pulmonary function tests which showed severe COPD with an FEV1 of 1.16.  He has gotten worse with shortness of breath since his last visit and has a productive cough of brown sputum. He doesn't think he's had any fever.  He has had some associated cough and wheezing.  Current Medications (verified): 1)  Crestor 20 Mg Tabs (Rosuvastatin Calcium) .... 1/2 Tab Once Daily 2)  Pantoprazole Sodium 40 Mg Tbec (Pantoprazole Sodium) .... Take 1 Tablet By Mouth Once A Day 3)  Caltrate 600+d 600-400 Mg-Unit  Tabs (Calcium Carbonate-Vitamin D) .Marland Kitchen.. 1 Tab Once Daily 4)  Aspirin 81 Mg  Tabs (Aspirin) 5)  Norvasc 10 Mg  Tabs (Amlodipine Besylate) ....  Take 1 Tablet By Mouth Once A Day 6)  Spiriva Handihaler 18 Mcg  Caps (Tiotropium Bromide Monohydrate) .... One Inhaled Daily 7)  Ventolin Hfa 108 (90 Base) Mcg/act Aers (Albuterol Sulfate) .... 2 Inh Q4h As Needed Shortness of Breath 8)  Cyanocobalamin 1000 Mcg/ml Inj Soln (Cyanocobalamin) .... Inject 1 Cc Intramuscularly Monthly 9)  Forteo 600 Mcg/2.68ml Soln (Teriparatide (Recombinant)) .... Inject Daily As Directed 10)  Pen Needles 5/16" 31g X 8 Mm Misc (Insulin Pen Needle) .... Use Daily As Directed With Forteo 11)  Benadryl 25 Mg Tabs (Diphenhydramine Hcl) .Marland Kitchen.. 1 By Mouth Q 6 Hours As Needed Itching 12)  Altace 10 Mg Caps (Ramipril) .Marland Kitchen.. 1 By Mouth Once Daily 13)  Furosemide 20 Mg Tabs (Furosemide) .... Take One Tablet By Mouth Daily.  Allergies: 1)  ! Ticlid  Past History:  Past Medical History: Reviewed history from 11/06/2009 and no changes required. Allergic rhinitis Osteoporosis 1. Coronary artery disease status post multiple percutaneous coronary     inventions with nonobstructive disease at last catheterization. 2. Peripheral vascular status post previous stenting of the totally     occluded iliac and status post stenting of an external iliac in     January 2009 with recurrent symptoms of questionable claudication. 3. Hypertension. 4. Hyperlipidemia. 5. History of amaurosis fugax 1998. 6. Gastroesophageal reflux disease.  anemia, B12 deficiency AFIB--post CABG---amiodarone Atrial fibrillation  Review  of Systems       ROS is negative except as outlined in HPI.   Vital Signs:  Patient profile:   70 year old male Height:      65 inches Weight:      167 pounds BMI:     27.89 O2 Sat:      93 % Pulse rate:   98 / minute BP sitting:   118 / 68  (left arm)  Vitals Entered By: Laurance Flatten CMA (February 15, 2010 8:59 AM)  Physical Exam  Additional Exam:  Gen. Well-nourished, in no distress   Neck: No JVD, thyroid not enlarged, no carotid bruits Lungs: No  tachypnea, decreased breath sounds and mild wheezes and a few basilar rales Cardiovascular: Rhythm regular, PMI not displaced,  heart sounds  normal, no murmurs or gallops, no peripheral edema, pulses normal in all 4 extremities. Abdomen: BS normal, abdomen soft and non-tender without masses or organomegaly, no hepatosplenomegaly. MS: No deformities, no cyanosis or clubbing   Neuro:  No focal sns   Skin:  no lesions    Impression & Recommendations:  Problem # 1:  COPD (ICD-496) He has severe COPD with exacerbation. We will give a Dosepak of prednisone and start him on a Z-Pak and arrange for him to see pulmonary later this week. We'll also get a repeat chest x-ray. His updated medication list for this problem includes:    Spiriva Handihaler 18 Mcg Caps (Tiotropium bromide monohydrate) ..... One inhaled daily    Ventolin Hfa 108 (90 Base) Mcg/act Aers (Albuterol sulfate) .Marland Kitchen... 2 inh q4h as needed shortness of breath  Problem # 2:  CORONARY ARTERY DISEASE (ICD-414.00) He had recent bypass surgery. He has had shortness of breath but no chest pain. He recently had a negative Myoview scan. This problem appears stable. We'll arrange followup with Dr. Excell Seltzer in 6 months. His updated medication list for this problem includes:    Aspirin 81 Mg Tabs (Aspirin)    Norvasc 10 Mg Tabs (Amlodipine besylate) .Marland Kitchen... Take 1 tablet by mouth once a day    Altace 10 Mg Caps (Ramipril) .Marland Kitchen... 1 by mouth once daily  Problem # 3:  HYPERTENSION (ICD-401.9)  This is well controlled on current medications. His updated medication list for this problem includes:    Aspirin 81 Mg Tabs (Aspirin)    Norvasc 10 Mg Tabs (Amlodipine besylate) .Marland Kitchen... Take 1 tablet by mouth once a day    Altace 10 Mg Caps (Ramipril) .Marland Kitchen... 1 by mouth once daily    Furosemide 20 Mg Tabs (Furosemide) .Marland Kitchen... Take one tablet by mouth daily.  His updated medication list for this problem includes:    Aspirin 81 Mg Tabs (Aspirin)    Norvasc 10 Mg  Tabs (Amlodipine besylate) .Marland Kitchen... Take 1 tablet by mouth once a day    Altace 10 Mg Caps (Ramipril) .Marland Kitchen... 1 by mouth once daily    Furosemide 20 Mg Tabs (Furosemide) .Marland Kitchen... Take one tablet by mouth daily.  Other Orders: Pulmonary Referral (Pulmonary) T-2 View CXR (71020TC)  Patient Instructions: 1)  Start a Prednisone 20mg  two tabs by mouth once daily for 3 days, then take one tab by mouth once daily for 3 days, then 1/2 tab by mouth once daily for 3 days, then stop. 2)  Start Amoxicillin 500mg  three times a day for 5 days. 3)  Start Mucinex 600mg  two times a day for 7 days. 4)  We will refer you to a pulmonary doctor this week due  to your abnormal pulmonary function test. 5)  We will repeat a chest x-ray the same day you see the pulmonary doctor at the [facility] office. 6)  Your physician wants you to follow-up in: 6 months with Dr. Excell Seltzer.  You will receive a reminder letter in the mail two months in advance. If you don't receive a letter, please call our office to schedule the follow-up appointment. Prescriptions: NORVASC 10 MG  TABS (AMLODIPINE BESYLATE) Take 1 tablet by mouth once a day  #30 Tablet x 11   Entered by:   Sherri Rad, RN, BSN   Authorized by:   Lenoria Farrier, MD, [facility]   Signed by:   Sherri Rad, RN, BSN on 02/15/2010   Method used:   Electron[facility] to        [facility]  [place] [address] (retail[facility]       [address]       [place], [place]  [address]       Ph: [phone] or [phone]       Fax: [phone]   RxID:   [id number] AMOXICILLIN 500 MG CAPS (AMOXICILLIN) take one tab by mouth three times a day for 5 days.  #15 x 0   Entered by:   Sherri Rad, RN, BSN   Authorized by:   Lenoria Farrier, MD, [facility]   Signed by:   Sherri Rad, RN, BSN on 02/15/2010   Method used:   [facility] to        [facility]  [place] [address] [facility]retail[facility]       [address]       [place], [place]  [address]       Ph: [phone] or [phone]       Fax: [phone]    RxID:   [id number] PREDNISONE 20 MG TABS (PREDNISONE) 2  tabs by mouth daily for 3 days, then 1 tab by mouth daily for 3 days, then  1/2 tab by mouth  daily for 3 days,  then stop.  #11 x 0   Entered by:   Sherri Rad, RN, BSN   Authorized by:   Lenoria Farrier, MD, [facility]   Signed by:   Sherri Rad, RN, BSN on 02/15/2010   Method used:   [facility]        [facility]  [place] [address] [facility]retail[facility]       [address]       [place], [place]  [address]       Ph: [phone] or [phone]       Fax: [phone]   RxID:   [id number]

## 2010-04-29 NOTE — Assessment & Plan Note (Signed)
Summary: B-12INJ/RCD   Nurse Visit   Allergies: 1)  ! Ticlid  Medication Administration  Injection # 1:    Medication: Vit B12 1000 mcg    Diagnosis: ANEMIA, B12 DEFICIENCY (ICD-281.1)    Route: IM    Site: L deltoid    Exp Date: 7/13    Lot #: [id number]    Mfr: American Regent    Patient tolerated injection without complications    Given by: Alfred Levins, CMA (February 05, 2010 4:21 PM)  Orders Added: 1)  Vit B12 1000 mcg [J3420] 2)  Admin of Therapeutic Inj  intramuscular or subcutaneous [[id number]]

## 2010-04-29 NOTE — Miscellaneous (Signed)
Summary: Orders Update  Clinical Lists Changes  Problems: Added new problem of CAROTID ARTERY STENOSIS (ICD-433.10) Orders: Added new Test order of Carotid Duplex (Carotid Duplex) - Signed 

## 2010-04-29 NOTE — Progress Notes (Signed)
Summary: [facility] called to check on status for Forteo refill  Phone Note From Pharmacy Call back at [phone] option 2   Caller: [facility]   - Caswell Corwin  Summary of Call: Sent refill req over for pts Forteo pen and have not rcvd response. Pls call in refill to [phone] option 2  or fax back to [phone]  Initial call taken by: Lucy Antigua,  April 01, 2010 10:50 AM  Follow-up for Phone Call        faxed to above pharmacy Follow-up by: Alfred Levins, CMA,  April 01, 2010 2:59 PM    Prescriptions: FORTEO 600 MCG/2.4ML SOLN (TERIPARATIDE (RECOMBINANT)) inject daily as directed  #2.4 Syringe x 4   Entered by:   Alfred Levins, CMA   Authorized by:   Birdie Sons MD   Signed by:   Alfred Levins, CMA on 04/01/2010   Method used:   Printed then faxed to ...       [facility]  [place] [address] (retail)       [address]       [place], [place]  [address]       Ph: [phone] or [phone]       Fax: [phone]   RxID:   [id number]

## 2010-04-29 NOTE — Assessment & Plan Note (Signed)
Summary: eph  Medications Added IMDUR 30 MG XR24H-TAB (ISOSORBIDE MONONITRATE) take one daily DICYCLOMINE HCL 20 MG TABS (DICYCLOMINE HCL) take one daily        Visit Type:  Follow-up Primary Provider:  Birdie Sons MD  CC:  edema in ankles and sob.  History of Present Illness: this is a 70 year old white male patient, who has had coronary artery disease with multiple percutaneous coronary interventions. He was having increased symptoms of chest pain and underwent cardiac catheter April 01, 2009. This revealed 40% distal left main, multiple 40% stenosis and is in the proximal, mid and distal LAD, 80% narrowing in the ramus branch of the circumflex, 40% within the stent in the mid distal RCA, and 40% in the distal RCA, with normal LV function. The patient had mostly nonobstructive disease although there is heavy calcification in the vessels, which makes interpretation of the severity of the lesion difficult. Because of this medical therapy and repeat Myoview scan was recommended. He had localized ischemia in one area revascularization may be considered.  The patient had a lexiscan April 13, 2009, which revealed fixed basal to mid inferior perfusion defect suggestive of infarction with no ischemia with basal inferior akinesis with preserved systolic function.  The patient continues to have occasional chest pain, sometimes occurring once a week, sometimes twice in one day. He describes it as a hurting in his back with burning in his chest and shortness of breath. It's always relieved with nitroglycerin. The patient continues to smoke 2 cigarettes to half a pack of cigarettes daily. He also complains of ankle edema.  Current Medications (verified): 1)  Crestor 20 Mg Tabs (Rosuvastatin Calcium) .... 1/2 Every Bedtime 2)  Pantoprazole Sodium 40 Mg Tbec (Pantoprazole Sodium) .... Take 1 Tablet By Mouth Two Times A Day 3)  Tenormin 50 Mg Tabs (Atenolol) .... Take 2 Tablet By Mouth Daily 4)   Triamcinolone Acetonide 0.025 % Oint (Triamcinolone Acetonide) .... Apply 1 A Small Amount To Affected Area Twice A Day 5)  Caltrate 600+d 600-400 Mg-Unit  Tabs (Calcium Carbonate-Vitamin D) .Marland Kitchen.. 1 Tab Once Daily 6)  [facility] Vitamin E 800 Unit  Caps (Vitamin E) .... Once Daily 7)  Aspirin 81 Mg  Tbec (Aspirin) .... Once Daily 8)  Norvasc 10 Mg  Tabs (Amlodipine Besylate) .... Take 1 Tablet By Mouth Once A Day 9)  Altace 10 Mg  Tabs (Ramipril) .... Take 1 Tablet By Mouth Once A Day 10)  Plavix 75 Mg  Tabs (Clopidogrel Bisulfate) .... Take 1 Tablet By Mouth Once A Day Hold Until 09/17/08 and Then Resume 11)  Spiriva Handihaler 18 Mcg  Caps (Tiotropium Bromide Monohydrate) .... One Inhaled Daily 12)  Ventolin Hfa 108 (90 Base) Mcg/act Aers (Albuterol Sulfate) .... 2 Inh Q4h As Needed Shortness of Breath 13)  Vitamin B12 Shot .Marland Kitchen.. 1 Shot Monthly 14)  Oxycodone-Acetaminophen 5-500 Mg Caps (Oxycodone-Acetaminophen) .... Take 1-2 Every 4 Hours As Needed 15)  Forteo 600 Mcg/2.34ml Soln (Teriparatide (Recombinant)) .... Inject Daily As Directed 16)  Pen Needles 5/16" 31g X 8 Mm Misc (Insulin Pen Needle) .... Use Daily As Directed With Forteo 17)  Imdur 30 Mg Xr24h-Tab (Isosorbide Mononitrate) .... Take One Daily 18)  Dicyclomine Hcl 20 Mg Tabs (Dicyclomine Hcl) .... Take One Daily  Allergies: 1)  ! Ticlid  Past History:  Past Medical History: Last updated: 12/02/2008 Allergic rhinitis Osteoporosis 1. Coronary artery disease status post multiple percutaneous coronary     inventions with nonobstructive disease at last  catheterization. 2. Peripheral vascular status post previous stenting of the totally     occluded iliac and status post stenting of an external iliac in     January 2009 with recurrent symptoms of questionable claudication. 3. Hypertension. 4. Hyperlipidemia. 5. History of amaurosis fugax 1998. 6. Gastroesophageal reflux disease.  anemia, B12 deficiency  Review of Systems        see history of present illness  Vital Signs:  Patient profile:   70 year old male Height:      66 inches Weight:      168 pounds Pulse rate:   72 / minute Pulse rhythm:   regular BP sitting:   112 / 62  (left arm)  Vitals Entered By: Jacquelin Hawking, CMA (April 22, 2009 10:34 AM)  Physical Exam  General:   Well-nournished, in no acute distress. Neck: No JVD, HJR, Bruit, or thyroid enlargement Lungs: decreased breath sounds throughout with a few wheezes in the upper lung fields Cardiovascular: Distant heart sounds,RRR, PMI not displaced, heart sounds normal, no murmurs, gallops, bruit, thrill, or heave. Abdomen: BS normal. Soft without organomegaly, masses, lesions or tenderness. Extremities: Right groin without hematoma or hemorrhage,without cyanosis,or clubbing. he has a trace of bilateral ankle edema. Good distal pulses bilateral SKin: Warm, no lesions or rashes  Musculoskeletal: No deformities Neuro: no focal signs    Impression & Recommendations:  Problem # 1:  CORONARY ARTERY DISEASE (ICD-414.00) Patient has history of coronary artery disease with multiple percutaneous coronary interventions in the past. Last April 01, 2009 showed mostly nonobstructive disease although heavy calcification in the vessels with the most severe being in the ramus branch of the circumflex. Lexiscan showed no ischemia with fixed basal to mid inferior perfusion defect consistent with infarction and basal inferior akinesis with preserved systolic function. Continued medical therapy is recommended at this time. Imdur was recently started. His updated medication list for this problem includes:    Tenormin 50 Mg Tabs (Atenolol) .Marland Kitchen... Take 2 tablet by mouth daily    Aspirin 81 Mg Tbec (Aspirin) ..... Once daily    Norvasc 10 Mg Tabs (Amlodipine besylate) .Marland Kitchen... Take 1 tablet by mouth once a day    Altace 10 Mg Tabs (Ramipril) .Marland Kitchen... Take 1 tablet by mouth once a day    Plavix 75 Mg Tabs (Clopidogrel  bisulfate) .Marland Kitchen... Take 1 tablet by mouth once a day hold until 09/17/08 and then resume    Imdur 30 Mg Xr24h-tab (Isosorbide mononitrate) .Marland Kitchen... Take one daily  Problem # 2:  TOBACCO USE (ICD-305.1) patient is advised to quit smoking. Informational sheet has been given to him and I  counseled him on the importance of smoking cessation  Problem # 3:  HYPERTENSION (ICD-401.9) Blood pressure is stable. He does have mild ankle edema. 2 g sodium diet as advised His updated medication list for this problem includes:    Tenormin 50 Mg Tabs (Atenolol) .Marland Kitchen... Take 2 tablet by mouth daily    Aspirin 81 Mg Tbec (Aspirin) ..... Once daily    Norvasc 10 Mg Tabs (Amlodipine besylate) .Marland Kitchen... Take 1 tablet by mouth once a day    Altace 10 Mg Tabs (Ramipril) .Marland Kitchen... Take 1 tablet by mouth once a day  Patient Instructions: 1)  Your physician recommends that you schedule a follow-up appointment in: 2 months with Dr. Juanda Chance. 2)  Your physician has requested that you limit the intake of sodium (salt) in your diet to two grams daily. Please see [facility] handout. 3)  Your  MD recomends to stop smoking.

## 2010-04-29 NOTE — Miscellaneous (Signed)
Summary: Orders Update  Clinical Lists Changes  Orders: Added new Test order of T-2 View CXR (71020TC) - Signed 

## 2010-04-29 NOTE — Assessment & Plan Note (Signed)
Summary: Pulmonary/ ext ov with hfa 75% p coaching   Copy to:  Dr. Charlies Constable Primary Provider/Referring Provider:  Birdie Sons MD  CC:  Cough and DOE- worse.  History of Present Illness: 70  yowm quit smoking July 2011 at CABG and did fine in terms of improved ex tol > walk easily [place]  [place].   February 16, 2010  1st pulmonary office eval   cc sob x 3 weeks first noticed walking up incline then cough onset x 2 weeks with flu like symptoms with white mucus mucus then turned bloody x 24 hours with maybe 2 tbs of blood, no purulent or putrid sputum, with cxr 11/21 at [facility] suggesting  pna in lingula,  new since 11/3 so started on amox and referred to pulmonary clinic.  has mild sore throat, no choking or obvious asp, had dental work done about 2 weeks before onset of symptoms.   He has had intermittent  Left sided CP x years post on R intermittent related to rib fx from coughing. No recent change.  rec ct neg for pulmonary emboli or ca.  Stop altace Stop aspirin until no bleeding for 3 days  Stop amoxiicillin and taek augmentin 875 with bfeast and supper and yogurt for lunch Please schedule a follow-up appointment in 10 days, sooner if needed  with cxr on return Add pepcid 20 mg one at bedtime GERD (REFLUX) diet Late Add:  pft's c/w copd /ab and cxr's inconclusiive, so CTangiogram done and ok rxv symbicort 160 2 puffs first thing  in am and 2 puffs again in pm about 12 hours   February 25, 2010 ov cough better, hasn't tried much outdoor activity yet, no more hemoptysis. >>d/c ace/norvasc, rx bystolic. tx w/ PPI/pepcid , xray showed Persistent patchy and nodular airspace disease in the left lung.March 11, 2010--Presents for follow up and med review. He has brought all his meds in today. He is taking symbicort only once daily (not two times a day ) and has not started on pepcid at bedtime. Last ov changed off ace /norvasc now on bystolic. Cough is better w/ no further  hemoptysis but not totally gone. Xray last ov showed persistent  patchy and nodular airspace disease in the left lung.  I will call with chest xray results.  Follow med calenadar closley and bring to each visit.  add pepcid 20mg  at bedtime  Increase Symbicort 2 puffs two times a day  April 23, 2010 ov cough and sob no better and throat very sore, no better p rx with rx as uri with doxy. mild dysphagia, mod hoarseness. Pt denies any significant   itching, sneezing,  nasal congestion or excess secretions,  fever, chills, sweats, unintended wt loss, pleuritic or exertional cp, hempoptysis, change in activity tolerance  orthopnea pnd or leg swelling. Pt also denies any obvious fluctuation in symptoms with weather or environmental change or other alleviating or aggravating factors.       Preventive Screening-Counseling & Management  Alcohol-Tobacco     Smoking Status: quit > 6 months  Current Medications (verified): 1)  Crestor 20 Mg Tabs (Rosuvastatin Calcium) .... 1/2 Tab Once Daily 2)  Furosemide 20 Mg Tabs (Furosemide) .... Take One Tablet By Mouth Daily. 3)  Pantoprazole Sodium 40 Mg Tbec (Pantoprazole Sodium) .... Take  One 30-60 Min Before First Meal of The Day 4)  Aspirin 81 Mg  Tabs (Aspirin) .... Take 1 Tablet By Mouth Once A Day 5)  Bystolic 20 Mg Tabs (Nebivolol Hcl) .... One Half Daily 6)  Spiriva Handihaler 18 Mcg  Caps (Tiotropium Bromide Monohydrate) .... One Inhaled Daily 7)  Symbicort 160-4.5 Mcg/act Aero (Budesonide-Formoterol Fumarate) .... 2 Puffs Two Times A Day 8)  Caltrate 600+d 600-400 Mg-Unit  Tabs (Calcium Carbonate-Vitamin D) .Marland Kitchen.. 1 Tab Once Daily 9)  Forteo 600 Mcg/2.39ml Soln (Teriparatide (Recombinant)) .... Inject Daily As Directed 10)  Cyanocobalamin 1000 Mcg/ml Inj Soln (Cyanocobalamin) .... Inject 1 Cc Intramuscularly Monthly 11)  Pepcid 20 Mg Tabs (Famotidine) .... Take One By Mouth At Bedtime 12)  Triamcinolone Acetonide 0.1 % Crea (Triamcinolone  Acetonide) .... Apply As Needed 13)  Tums 500 Mg Chew (Calcium Carbonate Antacid) .... As Needed 14)  Ventolin Hfa 108 (90 Base) Mcg/act Aers (Albuterol Sulfate) .... Inhale 2 Puffs Every 4hours As Needed Shortness of Breath 15)  Mucinex Dm 30-600 Mg Xr12h-Tab (Dextromethorphan-Guaifenesin) .Marland Kitchen.. 1 Two Times A Day As Needed 16)  Pen Needles 5/16" 31g X 8 Mm Misc (Insulin Pen Needle) .... Use Daily As Directed With Forteo 17)  Doxycycline Hyclate 100 Mg Tabs (Doxycycline Hyclate) .... One By Mouth Bid 18)  Medrol (Pak) 4 Mg Tabs (Methylprednisolone) .... As Directed 19)  Doxycycline Hyclate 100 Mg Caps (Doxycycline Hyclate) .... Two Times A Day Until Gone- Per Dr Rodena Medin 20)  Medrol (Pak) 4 Mg Tabs (Methylprednisolone) .... Take As Directed Until Finished Per Dr Rodena Medin  Allergies (verified): 1)  ! Ticlid  Past History:  Past Medical History: Allergic rhinitis Osteoporosis 1. Coronary artery disease status post multiple percutaneous coronary     inventions  > CABG 09/2009 2. Peripheral vascular status post previous stenting of the totally     occluded iliac and status post stenting of an external iliac in     January 2009 with recurrent symptoms of questionable claudication. 3. Hypertension. 4. Hyperlipidemia. 5. History of amaurosis fugax 1998. 6. Gastroesophageal reflux disease.  anemia, B12 deficiency AFIB--post CABG---amiodarone Atrial fibrillation COPD     - PFT's 01/28/10  FEV1 1.16 46%  ratio 44  with 27% improvement p B2,  DLCO 76%     - HFA 50% p coaching February 25, 2010 >  75% April 23, 2010  Hemoptysis onset 01/2010     - CT chest 02/19/10 : no PE, no ca/ nodular infiltrates  COMPLEX MED REGIMEN Meds reviewed with pt education and computerized med calendar completed March 11, 2010   Social History: Smoking Status:  quit > 6 months  Vital Signs:  Patient profile:   70 year old male Weight:      172.38 pounds O2 Sat:      92 % on Room air Temp:     97.8 degrees  F oral Pulse rate:   79 / minute BP sitting:   150 / 84  (left arm)  Vitals Entered By: Vernie Murders (April 23, 2010 10:01 AM)  O2 Flow:  Room air  Physical Exam  Additional Exam:  pleasant amb wf  wt 167 February 16, 2010  > 166 February 25, 2010 > 169 March 11, 2010 > 172 April 24, 2010  HEENT: nl dentition, turbinates,  Nl external ear canals without cough reflex. Nonspecific erythema of oropharanx, no thrush NECK :  without JVD/Nodes/TM/ nl carotid upstrokes bilaterally LUNGS: no acc muscle use, clear to A and P bilaterally without cough on insp or exp maneuvers CV:  RRR  no s3 or murmur or increase in P2, no edema  ABD:  soft  and nontender with nl excursion in the supine position. No bruits or organomegaly, bowel sounds nl MS:  warm without deformities, calf tenderness, cyanosis or clubbing     Impression & Recommendations:  Problem # 1:  COPD (ICD-496) GOLD III with most of his symptoms however upper airway in nature in a pt with 27% reversibility    DDX of  difficult airways managment all start with A and  include Adherence, Ace Inhibitors, Acid Reflux, Active Sinus Disease, Alpha 1 Antitripsin deficiency, Anxiety masquerading as Airways dz,  ABPA,  allergy(esp in young), Aspiration (esp in elderly), Adverse effects of DPI,  Active smokers, plus two Bs  = Bronchiectasis and Beta blocker use..and one C= CHF    Adherence better with use of med calendar. I spent extra time with the patient today explaining optimal mdi  technique.  This improved from  50-75% Try change symbicort to dulera.  ? adverse dpi reaction: consider stopping spiriva next ov as this is more asthmatic than emphysemic copd  ? acid reflux rx/ diet reviwed   Each maintenance medication was reviewed in detail including most importantly the difference between maintenance and as needed and under what circumstances the prns are to be used. This was done in the context of a medication calendar review  which provided the patient with a user-friendly unambiguous mechanism for medication administration and reconciliation and provides an action plan for all active problems. It is critical that this be shown to every doctor  for modification during the office visit if necessary so the patient can use it as a working document.   Problem # 2:  ACUTE BRONCHITIS (ICD-466.0) already on rx, no evidence strep, candidiasis.  suspect this is a nonspecific mucositis ? reaction to symbicort vs spiriva, try off spiriva next  Medications Added to Medication List This Visit: 1)  Doxycycline Hyclate 100 Mg Caps (Doxycycline hyclate) .... Two times a day until gone- per dr hodgin 2)  Medrol (pak) 4 Mg Tabs (Methylprednisolone) .... Take as directed until finished per dr hodgin 3)  Elwin Sleight 100-5 Mcg/act Aero (Mometasone furo-formoterol fum) .... 2 puffs first thing  in am and 2 puffs again in pm about 12 hours later  Other Orders: Est. Patient Level IV (16109) HFA Instruction [phone])  Patient Instructions: 1)  stop symbicort 2)  Try xopenex in place of ventolin 3)  Dulera 100 2 puffs first thing  in am and 2 puffs again in pm about 12 hours later and then brush with arm and Hammer toothpaste 4)  Work on inhaler technique:  relax and blow all the way out then take a nice smooth deep breath back in, triggering the inhaler at same time you start breathing in 5)  See calendar for specific medication instructions and bring it back for each and every office visit for every healthcare provider you see.  Without it,  you may not receive the best quality medical care that we feel you deserve.  6)  Please schedule a follow-up appointment in 4 weeks, sooner if needed with inhalers in hand

## 2010-04-29 NOTE — Progress Notes (Signed)
Summary: f/u on symptoms    Phone Note Outgoing Call   Call placed by: Sherri Rad, RN, BSN,  July 07, 2009 3:06 PM Call placed to: Patient Summary of Call: I attempted to call the pt to see how he is feeling since we spoke last week. I have left a message for him to call. Sherri Rad, RN, BSN  July 07, 2009 3:13 PM  I left a message for the pt to call to see how he is feeling. Sherri Rad, RN, BSN  July 09, 2009 11:12 AM   returning call, Migdalia Dk  July 09, 2009 11:22 AM   Follow-up for Phone Call        I called and spoke with the pt. He states when he went off Ranexa that his diarrhea cleared in 2 days. He did try to go back Ranexa and it did irritate his stomach again. He has stopped Ranexa. I explained that this is ok and I will make Dr. Juanda Chance aware.  Follow-up by: Sherri Rad, RN, BSN,  July 15, 2009 2:47 PM

## 2010-04-29 NOTE — Progress Notes (Signed)
Summary: Question about appt with Dr.Bartle   Jasmine December calling pt    Phone Note Call from Patient Call back at [facility] Phone [phone]   Caller: Patient Summary of Call: Pt calling regarding seeing Dr.Bartle Initial call taken by: Judie Grieve,  September 04, 2009 10:50 AM  Follow-up for Phone Call        states he spoke with Herbert Seta and he is supposed to see Dr Laneta Simmers.  States Herbert Seta told him if he hadnt been called with an appointment by today that he should call our office.  Instructed pt I will follow up about appoinment and we will call him back.  He states understanding. Follow-up by: Charolotte Capuchin, RN,  September 04, 2009 11:10 AM  Additional Follow-up for Phone Call Additional follow up Details #1::        Omar Person to call pt with appointment Additional Follow-up by: Charolotte Capuchin, RN,  September 04, 2009 11:24 AM

## 2010-04-29 NOTE — Assessment & Plan Note (Signed)
Summary: Pulmonary/ ext ov with hfa 50%    Copy to:  Dr. Charlies Constable Primary Provider/Referring Provider:  Birdie Sons MD  CC:  Cough- some better.  History of Present Illness: 22 yowm quit smoking July 2011 at CABG and did fine in terms of improved ex tol > [place]   February 16, 2010  1st pulmonary office eval   cc sob x 3 weeks first noticed walking up incline then cough onset x 2 weeks with flu like symptoms with white mucus mucus then turned bloody x 24 hours with maybe 2 tbs of blood, no purulent or putrid sputum, with cxr 11/21 at [facility] suggesting  pna in lingula,  new since 11/3 so started on amox and referred to pulmonary clinic.  has mild sore throat, no choking or obvious asp, had dental work done about 2 weeks before onset of symptoms.   He has had intermittent  Left sided CP x years post on R intermittent related to rib fx from coughing. No recent change.  rec ct neg for pulmonary emboli or ca.  Stop altace Stop aspirin until no bleeding for 3 days  Stop amoxiicillin and taek augmentin 875 with bfeast and supper and yogurt for lunch Please schedule a follow-up appointment in 10 days, sooner if needed  with cxr on return Add pepcid 20 mg one at bedtime GERD (REFLUX) diet Late Add:  pft's c/w copd /ab and cxr's inconclusiive, need ct chest to complete the w/u return for sample of symbicort 160 2 puffs first thing  in am and 2 puffs again in pm about 12 hours   February 25, 2010 ov cough better, hasn't tried much outdoor activity yet, no more hemoptysis.  Pt denies any significant sore throat, dysphagia, itching, sneezing,  nasal congestion or excess secretions,  fever, chills, sweats, unintended wt loss, pleuritic or exertional cp,  variability  in activity tolerance  orthopnea pnd or leg swelling   Current Medications (verified): 1)  Crestor 20 Mg Tabs (Rosuvastatin Calcium) .... 1/2 Tab Once Daily 2)  Pantoprazole Sodium 40 Mg Tbec  (Pantoprazole Sodium) .... Take  One 30-60 Min Before First Meal of The Day 3)  Caltrate 600+d 600-400 Mg-Unit  Tabs (Calcium Carbonate-Vitamin D) .Marland Kitchen.. 1 Tab Once Daily 4)  Aspirin 81 Mg  Tabs (Aspirin) .... Hold For 3 Days For Any Signs of Bleeding 5)  Norvasc 10 Mg  Tabs (Amlodipine Besylate) .... Take 1 Tablet By Mouth Once A Day 6)  Spiriva Handihaler 18 Mcg  Caps (Tiotropium Bromide Monohydrate) .... One Inhaled Daily 7)  Ventolin Hfa 108 (90 Base) Mcg/act Aers (Albuterol Sulfate) .... 2 Inh Q4h As Needed Shortness of Breath 8)  Cyanocobalamin 1000 Mcg/ml Inj Soln (Cyanocobalamin) .... Inject 1 Cc Intramuscularly Monthly 9)  Forteo 600 Mcg/2.64ml Soln (Teriparatide (Recombinant)) .... Inject Daily As Directed 10)  Pen Needles 5/16" 31g X 8 Mm Misc (Insulin Pen Needle) .... Use Daily As Directed With Forteo 11)  Benadryl 25 Mg Tabs (Diphenhydramine Hcl) .Marland Kitchen.. 1 By Mouth Q 6 Hours As Needed Itching 12)  Furosemide 20 Mg Tabs (Furosemide) .... Take One Tablet By Mouth Daily. 13)  Mucinex 600 Mg Xr12h-Tab (Guaifenesin) .... Take One Tab By Mouth Two Times A Day For 1 Week.-Stopped Taking 14)  Augmentin 875-125 Mg  Tabs (Amoxicillin-Pot Clavulanate) .... One At Breakfast and One Supper and Culured Yogurt For Lunch 15)  Pepcid 20 Mg Tabs (Famotidine) .... Take One By Mouth  At Bedtime 16)  Symbicort 160-4.5 Mcg/act Aero (Budesonide-Formoterol Fumarate) .... 2 Puffs Two Times A Day 17)  Altace 10 Mg Caps (Ramipril) .Marland Kitchen.. 1 Once Daily  Allergies (verified): 1)  ! Ticlid  Past History:  Past Medical History: Allergic rhinitis Osteoporosis 1. Coronary artery disease status post multiple percutaneous coronary     inventions  > CABG 09/2009 2. Peripheral vascular status post previous stenting of the totally     occluded iliac and status post stenting of an external iliac in     January 2009 with recurrent symptoms of questionable claudication. 3. Hypertension. 4. Hyperlipidemia. 5. History  of amaurosis fugax 1998. 6. Gastroesophageal reflux disease.  anemia, B12 deficiency AFIB--post CABG---amiodarone Atrial fibrillation COPD     - PFT's 11/3  FEV1 1.16 46%  ratio 44  with 27% improvement p B2     - HFA 50% p coaching February 25, 2010  Hemoptysis onset 01/2010     - CT chest 02/19/10 : no PE, no ca/ nodular infiltrates   Vital Signs:  Patient profile:   70 year old male Weight:      166.13 pounds O2 Sat:      95 % on Room air Temp:     97.6 degrees F oral Pulse rate:   80 / minute BP sitting:   114 / 70  (left arm)  Vitals Entered By: Vernie Murders (February 25, 2010 9:25 AM)  O2 Flow:  Room air  Physical Exam  Additional Exam:  pleasant amb wf  wt 167 February 16, 2010  > 166 February 25, 2010  HEENT: nl dentition, turbinates, and orophanx. Nl external ear canals without cough reflex NECK :  without JVD/Nodes/TM/ nl carotid upstrokes bilaterally LUNGS: no acc muscle use, clear to A and P bilaterally without cough on insp or exp maneuvers CV:  RRR  no s3 or murmur or increase in P2, no edema  ABD:  soft and nontender with nl excursion in the supine position. No bruits or organomegaly, bowel sounds nl MS:  warm without deformities, calf tenderness, cyanosis or clubbing SKIN: warm and dry without lesions   NEURO:  alert, approp, no deficits     CXR  Procedure date:  02/25/2010  Findings:      Persistent patchy and nodular airspace disease in the left lung. No new infiltrate or edema.  Based on prior CT of the chest, further CT follow-up would be warranted, as there remain nodular components of abnormality which could potentially represent malignancy. Overviewed by Sherene Sires:  slt better aeration esp on lateral view  Impression & Recommendations:  Problem # 1:  PNEUMONIA ORGANISM NOS (ICD-486)  His updated medication list for this problem includes:    Augmentin 875-125 Mg Tabs (Amoxicillin-pot clavulanate) ..... One at breakfast and one supper and  culured yogurt for lunch  Overall much better with rx for pna, repeat cxr next ov   Orders: Est. Patient Level IV (16109)  Problem # 2:  COPD (ICD-496)  GOLD III with def reversible component so  continue  symbicort 160 2 puffs first thing  in am and 2 puffs again in pm about 12 hours later.    DDX of  difficult airways managment all start with A and  include Adherence, Ace Inhibitors, Acid Reflux, Active Sinus Disease, Alpha 1 Antitripsin deficiency, Anxiety masquerading as Airways dz,  ABPA,  allergy(esp in young), Aspiration (esp in elderly), Adverse effects of DPI,  Active smokers, plus one B  =  Beta blocker use.Marland Kitchen    Aherence:  I spent extra time with the patient today explaining optimal mdi  technique.  This improved from  25-50% needs more work  Ace effect:  try off until establish baseline  Orders: Est. Patient Level IV (09811) HFA Instruction [phone])  Problem # 3:  HYPERTENSION (ICD-401.9)  The following medications were removed from the medication list:    Norvasc 10 Mg Tabs (Amlodipine besylate) .Marland Kitchen... Take 1 tablet by mouth once a day    Altace 10 Mg Caps (Ramipril) .Marland Kitchen... 1 once daily His updated medication list for this problem includes:    Furosemide 20 Mg Tabs (Furosemide) .Marland Kitchen... Take one tablet by mouth daily.    Bystolic 20 Mg Tabs (Nebivolol hcl) ..... One half daily   ACE inhibitors are problematic in  pts with airway complaints because  even experienced pulmonologists can't always distinguish ace effects from copd/asthma.  By themselves they don't actually cause a problem, much like oxygen can't by itself start a fire, but they certainly serve as a powerful catalyst or enhancer for any "fire"  or inflammatory process in the upper airway, be it caused by an ET  tube or more commonly reflux (especially in the obese or pts with known GERD or who are on biphoshonates).  In the era of ARB near equivalency until we have a better handle on the reversibility of the airway  problem, it just makes sense to avoid ace entirely in the short run and then decide later, having established a level of airway control using a reasonable limited regimen, whether to add back ace but even then being very careful to observe the pt for worsening airway control and number of meds used/ needed to control symptoms.    Try Bystolic, the most beta -1  selective Beta blocker available in sample form, with bisoprolol the most selective generic choice  on the market.   Struggling with concept of med reconciliation.  To keep things simple, I have asked the patient to first separate medicines that are perceived as maintenance, that is to be taken daily "no matter what", from those medicines that are taken on only on an as-needed basis and I have given the patient examples of both, and then return to see our NP to generate a  detailed  medication calendar which should be followed until the next physician sees the patient and updates it.     Medications Added to Medication List This Visit: 1)  Mucinex 600 Mg Xr12h-tab (Guaifenesin) .... Take one tab by mouth two times a day for 1 week.-stopped taking 2)  Symbicort 160-4.5 Mcg/act Aero (Budesonide-formoterol fumarate) .... 2 puffs two times a day 3)  Altace 10 Mg Caps (Ramipril) .Marland Kitchen.. 1 once daily 4)  Bystolic 20 Mg Tabs (Nebivolol hcl) .... One half daily  Patient Instructions: 1)  Work on inhaler technique:  relax and blow all the way out then take a nice smooth deep breath back in, triggering the inhaler at same time you start breathing in  2)  Stop altace and norvasc 3)  Start bystolic 20 mg one half daily 4)  See Tammy NP w/in 2 weeks with all your medications, even over the counter meds, separated in two separate bags, the ones you take no matter what vs the ones you stop once you feel better and take only as needed.  She will generate for you a new user friendly medication calendar that will put [place] all on the same page re:  your medication  use. (bring planner with you) 5)  NEEDS F/U CXR NEXT OV

## 2010-04-29 NOTE — Assessment & Plan Note (Signed)
Summary: Pulmonary/ new pt eval for prob pna   Visit Type:  Initial Consult Copy to:  Dr. Charlies Constable Primary Provider/Referring Provider:  Birdie Sons MD  CC:  Hemoptysis.  History of Present Illness: 77 yowm quit smoking July 2011 at CABG and did fine in terms of improved ex tol > walk easily on  [place].   February 16, 2010  1st pulmonary office eval   cc sob x 3 weeks first noticed walking up incline then cough onset x 2 weeks with flu like symptoms with white mucus mucus then turned bloody x 24 hours with maybe 2 tbs of blood, no purulent or putrid sputum, with cxr suggesting  pna in lingula,  new since 11/3 so started on amox and referred to pulmonary clinic.  has mild sore throat, no choking or obvious asp, had dental work done about 2 weeks before onset of symptoms.  Pt denies any significant epistaxis,  nasal congestion or excess secretions, fever, chills, sweats, unintended wt loss, classically  pleuritic or exertional cp, orthopnea pnd or leg swelling.    He has had intermittent  Left sided CP x years post on R intermittent related to rib fx from coughing. No recent change  Current Medications (verified): 1)  Crestor 20 Mg Tabs (Rosuvastatin Calcium) .... 1/2 Tab Once Daily 2)  Pantoprazole Sodium 40 Mg Tbec (Pantoprazole Sodium) .... Take 1 Tablet By Mouth Once A Day 3)  Caltrate 600+d 600-400 Mg-Unit  Tabs (Calcium Carbonate-Vitamin D) .Marland Kitchen.. 1 Tab Once Daily 4)  Aspirin 81 Mg  Tabs (Aspirin) 5)  Norvasc 10 Mg  Tabs (Amlodipine Besylate) .... Take 1 Tablet By Mouth Once A Day 6)  Spiriva Handihaler 18 Mcg  Caps (Tiotropium Bromide Monohydrate) .... One Inhaled Daily 7)  Ventolin Hfa 108 (90 Base) Mcg/act Aers (Albuterol Sulfate) .... 2 Inh Q4h As Needed Shortness of Breath 8)  Cyanocobalamin 1000 Mcg/ml Inj Soln (Cyanocobalamin) .... Inject 1 Cc Intramuscularly Monthly 9)  Forteo 600 Mcg/2.72ml Soln (Teriparatide (Recombinant)) .... Inject Daily As  Directed 10)  Pen Needles 5/16" 31g X 8 Mm Misc (Insulin Pen Needle) .... Use Daily As Directed With Forteo 11)  Benadryl 25 Mg Tabs (Diphenhydramine Hcl) .Marland Kitchen.. 1 By Mouth Q 6 Hours As Needed Itching 12)  Altace 10 Mg Caps (Ramipril) .Marland Kitchen.. 1 By Mouth Once Daily 13)  Furosemide 20 Mg Tabs (Furosemide) .... Take One Tablet By Mouth Daily. 14)  Prednisone 20 Mg Tabs (Prednisone) .... 2  Tabs By Mouth Daily For 3 Days, Then 1 Tab By Mouth Daily For 3 Days, Then  1/2 Tab By Mouth  Daily For 3 Days,  Then Stop. 15)  Amoxicillin 500 Mg Caps (Amoxicillin) .... Take One Tab By Mouth Three Times A Day For 5 Days. 16)  Mucinex 600 Mg Xr12h-Tab (Guaifenesin) .... Take One Tab By Mouth Two Times A Day For 1 Week.  Allergies (verified): 1)  ! Ticlid  Past History:  Past Medical History: Allergic rhinitis Osteoporosis 1. Coronary artery disease status post multiple percutaneous coronary     inventions with nonobstructive disease at last catheterization. 2. Peripheral vascular status post previous stenting of the totally     occluded iliac and status post stenting of an external iliac in     January 2009 with recurrent symptoms of questionable claudication. 3. Hypertension. 4. Hyperlipidemia. 5. History of amaurosis fugax 1998. 6. Gastroesophageal reflux disease.  anemia, B12 deficiency AFIB--post CABG---amiodarone Atrial fibrillation COPD     -  PFT's 11/3  FEV1 1.16 46%  ratio 44  with 27% improvement p B2  Family History: Reviewed history from 08/14/2008 and no changes required. father deceased MI-96yo Family History of CAD Male 1st degree relative <50 mother deceased age 69 yo Rheumatoid arthritis 2 brothers and a nephew with heart disease No FH of Colon Cancer:  Social History: Widowed Former smoker.  Quit July 2011.  Smoked approx 50 yrs up to 3 ppd Regular exercise-no Alcohol Use - yes Illicit Drug Use - no Retired from [employer]  Review of Systems       The patient  complains of shortness of breath with activity, shortness of breath at rest, productive cough, coughing up blood, chest pain, indigestion, sore throat, headaches, nasal congestion/difficulty breathing through nose, sneezing, hand/feet swelling, and change in color of mucus.  The patient denies non-productive cough, irregular heartbeats, acid heartburn, loss of appetite, weight change, abdominal pain, difficulty swallowing, tooth/dental problems, itching, ear ache, anxiety, depression, joint stiffness or pain, rash, and fever.    Vital Signs:  Patient profile:   70 year old male Weight:      167.13 pounds O2 Sat:      90 % on Room air Temp:     97.3 degrees F oral Pulse rate:   95 / minute BP sitting:   108 / 72  (left arm)  Vitals Entered By: Vernie Murders (February 16, 2010 12:04 PM)  O2 Flow:  Room air  O2 Sat Comments o2 sat was 85%ra when pt arrived--this increased to 90%ra after resting approx 2 min  Physical Exam  Additional Exam:  pleasant amb wf  wt 167 February 16, 2010  HEENT: nl dentition, turbinates, and orophanx. Nl external ear canals without cough reflex NECK :  without JVD/Nodes/TM/ nl carotid upstrokes bilaterally LUNGS: no acc muscle use, clear to A and P bilaterally without cough on insp or exp maneuvers CV:  RRR  no s3 or murmur or increase in P2, no edema  ABD:  soft and nontender with nl excursion in the supine position. No bruits or organomegaly, bowel sounds nl MS:  warm without deformities, calf tenderness, cyanosis or clubbing SKIN: warm and dry without lesions   NEURO:  alert, approp, no deficits     Impression & Recommendations:  Problem # 1:  PNEUMONIA ORGANISM NOS (ICD-486) Assoc with dental work suggest asp mechanism, esp given slow onset and assoc hemoptysis albeit on aspirin > try off and rx augmentin but will need a CT chest because symptoms are disproportionate to infiltrates needs CT chest and eval off ACE     Orders: Prescription Created  Electronically [phone]) New Patient Level V (81191) Misc. Referral (Misc. Ref)  Problem # 2:  HYPERTENSION (ICD-401.9)  BP low and symptoms atypical ? ace related > try off x 2 weeks.  The following medications were removed from the medication list:    Altace 10 Mg Caps (Ramipril) .Marland Kitchen... 1 by mouth once daily His updated medication list for this problem includes:    Norvasc 10 Mg Tabs (Amlodipine besylate) .Marland Kitchen... Take 1 tablet by mouth once a day    Furosemide 20 Mg Tabs (Furosemide) .Marland Kitchen... Take one tablet by mouth daily.  Orders: New Patient Level V (47829)  Problem # 3:  COPD (ICD-496) GOLD III with def reversible component so will ask him to continue prednisone and have him start on symbicort 160 2 puffs first thing  in am and 2 puffs again in pm about 12  hours later   Medications Added to Medication List This Visit: 1)  Pantoprazole Sodium 40 Mg Tbec (Pantoprazole sodium) .... Take  one 30-60 min before first meal of the day 2)  Aspirin 81 Mg Tabs (Aspirin) .... Hold for 3 days for any signs of bleeding 3)  Augmentin 875-125 Mg Tabs (Amoxicillin-pot clavulanate) .... One at breakfast and one supper and culured yogurt for lunch 4)  Pepcid 20 Mg Tabs (Famotidine) .... Take one by mouth at bedtime  Patient Instructions: 1)  Stop altace 2)  Stop aspirin until no bleeding for 3 days  3)  Stop amoxiicillin and taek augmentin 875 with bfeast and supper and yogurt for lunch 4)  Please schedule a follow-up appointment in 10 days, sooner if needed  with cxr on return 5)  Add pepcid 20 mg one at bedtime 6)  GERD (REFLUX)  is a common cause of respiratory symptoms. It commonly presents without heartburn and can be treated with medication, but also with lifestyle changes including avoidance of late meals, excessive alcohol, smoking cessation, and avoid fatty foods, chocolate, peppermint, colas, red wine, and acidic juices such as orange juice. NO MINT OR MENTHOL PRODUCTS SO NO COUGH DROPS  7)   USE SUGARLESS CANDY INSTEAD (jolley ranchers)  8)  NO OIL BASED VITAMINS  9)  Late Add:  pft's c/w copd /ab and cxr's inconclusiive, need ct chest to complete the w/u return for sample of symbicort 160 2 puffs first thing  in am and 2 puffs again in pm about 12 hours later  Prescriptions: AUGMENTIN 875-125 MG  TABS (AMOXICILLIN-POT CLAVULANATE) one at breakfast and one supper and culured yogurt for lunch  #20 x 0   Entered and Authorized by:   Nyoka Cowden MD   Signed by:   Nyoka Cowden MD on 02/16/2010   Method used:   Electronically to        [facility]  [place] [address] (retail)       [address]       [place], [place]  [address]       Ph: [phone] or [phone]       Fax: [phone]   RxID:   [id number]   Appended Document: Pulmonary/ new pt eval for prob pna called pt after reviewd all his xrays and labs/pft's.  advised will need ct scan in am and go to er in meantime if condition worsens at all   Appended Document: Pulmonary/ new pt eval for prob pna Called and spoke with pt.  He states that he is already sched to have ct chest this pm at 2:15 pm and then he will come here to the office afterwards to be given symbicort 160/4.5 sample and instruct per MW request- (see late add to pt instructions).

## 2010-04-29 NOTE — Miscellaneous (Signed)
Clinical Lists Changes  Observations: Added new observation of CXR RESULTS:  Findings: Sternotomy for CABG.  Cardiac silhouette normal in size,   unchanged.  Thoracic aorta atherosclerotic, unchanged.  Small   hiatal hernia, unchanged.  Minimal linear atelectasis in the right   lower lobe.  Lungs otherwise clear.  Pulmonary vascularity normal   without evidence of pulmonary edema.  No pleural effusions.  Healed   bilateral rib fractures again noted.  Surgical clips in the left   side of the neck.    IMPRESSION:   Linear atelectasis in the right lower lobe.  No acute   cardiopulmonary disease otherwise.  Small hiatal hernia.    Read By:  Arnell Sieving,  M.D. (11/16/2009 13:12) Added new observation of CT OF CHEST:   IMPRESSION:    1.  Status post median sternotomy.  No evidence for sternal   dehiscence or an abscess.   2.  Small  bilateral pleural effusions and right basilar   atelectasis/infiltrate.   3.  Moderate sized hiatal hernia.    Read By:  Stoney Bang,  M.D. (10/21/2009 13:13) Added new observation of CXR RESULTS:   Findings: The patient is post median sternotomy.  The sternal wire   sutures appear intact.  There is improved aeration of the right   lung base when compared to prior study.  There are multiple old   left rib fractures noted.  There is a hiatal hernia present which   contains an air-fluid level.  There is no pneumothorax.    IMPRESSION:   Status post median sternotomy.  The sternal sutures appear intact.   Improving aeration of the right lung base.  No pneumothorax.   Hiatal hernia noted.    Read By:  Stoney Bang,  M.D. (10/21/2009 13:12) Added new observation of CARDCATHFIND:  RECOMMENDATIONS:  The patient has moderately severe and diffuse coronary   disease.  When I saw him a week ago, his symptoms were somewhat   refractory to medical therapy.  He is a little better since we increased   the Imdur at that time.  I think our  options are bypass surgery versus   continued medical management.  Unfortunately, he continues to smoke, in   either way it will be very important for him to quit.  I will discuss   the options with him.               Bruce Elvera Lennox Juanda Chance, MD, [facility]  (08/11/2009 13:12) Added new observation of CARDCATHFIND:  CONCLUSION:  Coronary artery disease status post multiple prior   percutaneous coronary interventions with 40% distal left main stenosis,   multiple 40% stenoses in the proximal mid and distal LAD, 80% narrowing   in the ramus branch of circumflex artery, 40% narrowing within the stent   in the mid-to-distal right coronary artery, and 40% narrowing in the   distal right coronary artery with normal LV function.      RECOMMENDATIONS:  The patient has mostly what appears to be   nonobstructive disease, although there is heavy calcification in the   vessels, which makes interpretation of the severity of the lesion   difficult.  The tightest lesion appears to be in the ramus branch.  I   think we should plan initial medical therapy and repeat his Myoview   scan.  If he localizes ischemia in one area, we may consider   revascularization.  Bruce Elvera Lennox Juanda Chance, MD, [facility]  (04/22/2009 13:11)      Cardiac Cath  Procedure date:  04/22/2009  Findings:       CONCLUSION:  Coronary artery disease status post multiple prior   percutaneous coronary interventions with 40% distal left main stenosis,   multiple 40% stenoses in the proximal mid and distal LAD, 80% narrowing   in the ramus branch of circumflex artery, 40% narrowing within the stent   in the mid-to-distal right coronary artery, and 40% narrowing in the   distal right coronary artery with normal LV function.      RECOMMENDATIONS:  The patient has mostly what appears to be   nonobstructive disease, although there is heavy calcification in the   vessels, which makes interpretation of the severity of the lesion    difficult.  The tightest lesion appears to be in the ramus branch.  I   think we should plan initial medical therapy and repeat his Myoview   scan.  If he localizes ischemia in one area, we may consider   revascularization.               Bruce Elvera Lennox Juanda Chance, MD, [facility]   Cardiac Cath  Procedure date:  08/11/2009  Findings:       RECOMMENDATIONS:  The patient has moderately severe and diffuse coronary   disease.  When I saw him a week ago, his symptoms were somewhat   refractory to medical therapy.  He is a little better since we increased   the Imdur at that time.  I think our options are bypass surgery versus   continued medical management.  Unfortunately, he continues to smoke, in   either way it will be very important for him to quit.  I will discuss   the options with him.               Bruce Elvera Lennox Juanda Chance, MD, [facility]   CXR  Procedure date:  11/16/2009  Findings:       Findings: Sternotomy for CABG.  Cardiac silhouette normal in size,   unchanged.  Thoracic aorta atherosclerotic, unchanged.  Small   hiatal hernia, unchanged.  Minimal linear atelectasis in the right   lower lobe.  Lungs otherwise clear.  Pulmonary vascularity normal   without evidence of pulmonary edema.  No pleural effusions.  Healed   bilateral rib fractures again noted.  Surgical clips in the left   side of the neck.    IMPRESSION:   Linear atelectasis in the right lower lobe.  No acute   cardiopulmonary disease otherwise.  Small hiatal hernia.    Read By:  Arnell Sieving,  M.D.  CXR  Procedure date:  10/21/2009  Findings:        Findings: The patient is post median sternotomy.  The sternal wire   sutures appear intact.  There is improved aeration of the right   lung base when compared to prior study.  There are multiple old   left rib fractures noted.  There is a hiatal hernia present which   contains an air-fluid level.  There is no pneumothorax.    IMPRESSION:   Status post median  sternotomy.  The sternal sutures appear intact.   Improving aeration of the right lung base.  No pneumothorax.   Hiatal hernia noted.    Read By:  Stoney Bang,  M.D.  CT of Chest  Procedure date:  10/21/2009  Findings:  IMPRESSION:    1.  Status post median sternotomy.  No evidence for sternal   dehiscence or an abscess.   2.  Small  bilateral pleural effusions and right basilar   atelectasis/infiltrate.   3.  Moderate sized hiatal hernia.    Read By:  Stoney Bang,  M.D.

## 2010-04-29 NOTE — Assessment & Plan Note (Signed)
Summary: b-12inj/rcd rsc bmp/njr   Nurse Visit   Allergies: 1)  ! Ticlid  Medication Administration  Injection # 1:    Medication: Vit B12 1000 mcg    Diagnosis: ANEMIA, B12 DEFICIENCY (ICD-281.1)    Route: IM    Site: R deltoid    Exp Date: 11/27/2010    Lot #: [id number]    Mfr: American Regent    Patient tolerated injection without complications    Given by: Gladis Riffle, RN (June 29, 2009 9:06 AM)  Orders Added: 1)  Vit B12 1000 mcg [J3420] 2)  Admin of Therapeutic Inj  intramuscular or subcutaneous [96372]   Medication Administration  Injection # 1:    Medication: Vit B12 1000 mcg    Diagnosis: ANEMIA, B12 DEFICIENCY (ICD-281.1)    Route: IM    Site: R deltoid    Exp Date: 11/27/2010    Lot #: [id number]    Mfr: American Regent    Patient tolerated injection without complications    Given by: Gladis Riffle, RN (June 29, 2009 9:06 AM)  Orders Added: 1)  Vit B12 1000 mcg [J3420] 2)  Admin of Therapeutic Inj  intramuscular or subcutaneous [[id number]]

## 2010-04-29 NOTE — Assessment & Plan Note (Signed)
Summary: Cardiology Nuclear Study  Nuclear Med Background Indications for Stress Test: Evaluation for Ischemia, Stent Patency, Post Hospital  Indications Comments: Recent cath 04/01/09, need to assess CFX lesion.  History: COPD, Heart Catheterization, Myocardial Perfusion Study, Stents  History Comments: Multi- stents in past; 08/12/08 ZOX:WRUEA inferior infarct, no ischemia, EF=69%; 1/5/11Cath:patent stents, N/O CAD, EF=65%;s/p iliac stents  Symptoms: Chest Pain, Chest Tightness with Exertion, Diaphoresis, Dizziness, DOE, Fatigue, Near Syncope, SOB  Symptoms Comments: Last episode of CP:1 week ago. Dizziness and near syncope with coughing.   Nuclear Pre-Procedure Cardiac Risk Factors: Claudication, Family History - CAD, Hypertension, Lipids, Overweight, PVD, Smoker Caffeine/Decaff Intake: None NPO After: 6:00 PM Lungs: Deminished breath sounds with minimal expiratory wheezes.  Ventolin inhaler used prior to infusion.  Baseline O2 SAT 93-94% with deep breath. IV 0.9% NS with Angio Cath: 22g     IV Site: (R) AC IV Started by: Irean Hong RN Chest Size (in) 42     Height (in): 66 Weight (lb): 164 BMI: 26.57  Nuclear Med Study 1 or 2 day study:  1 day     Stress Test Type:  Eugenie Birks Reading MD:  Marca Ancona, MD     Referring MD:  Charlies Constable, MD Resting Radionuclide:  Technetium 73m Tetrofosmin     Resting Radionuclide Dose:  11.0 mCi  Stress Radionuclide:  Technetium 49m Tetrofosmin     Stress Radionuclide Dose:  33.0 mCi   Stress Protocol   Lexiscan: 0.4 mg   Stress Test Technologist:  Rea College CMA-N     Nuclear Technologist:  Harlow Asa CNMT  Rest Procedure  Myocardial perfusion imaging was performed at rest 45 minutes following the intravenous administration of Myoview Technetium 56m Tetrofosmin.  Stress Procedure  The patient received IV Lexiscan 0.4 mg over 15-seconds.  Myoview injected at 30-seconds.  There were no significant changes with infusion.   Quantitative spect images were obtained after a 45 minute delay.  QPS Raw Data Images:  Normal; no motion artifact; normal heart/lung ratio. Stress Images:  Basal to mid inferior perfusion defect.  Rest Images:  Basal to mid inferior perfusion defect.  Subtraction (SDS):  Fixed basal to mid inferior perfusion defect.  Transient Ischemic Dilatation:  .99  (Normal <1.22)  Lung/Heart Ratio:  .31  (Normal <0.45)  Quantitative Gated Spect Images QGS EDV:  109 ml QGS ESV:  36 ml QGS EF:  67 % QGS cine images:  Basal inferior akinesis.    Overall Impression  Exercise Capacity: Lexiscan study.  BP Response: Hypotensive blood pressure response. Clinical Symptoms: Short of breath.  ECG Impression: No significant ST segment change suggestive of ischemia. Overall Impression: Fixed basal to mid inferior perfusion defect suggestive of infarction.  No ischemia.  Basal inferior akinesis with perserved systolic function.   Appended Document: Cardiology Nuclear Study The pt has an appt 04/22/09 with the PA.  Appended Document: Cardiology Nuclear Study Results discussed with the pt at PA appt on 04/22/09.

## 2010-04-29 NOTE — Progress Notes (Signed)
Summary: c/o cough up blood    Phone Note Call from Patient Call back at Home Phone [phone]   Caller: Patient Reason for Call: Talk to Nurse Summary of Call: c/o  cough up blood this am.  Initial call taken by: Lorne Skeens,  February 16, 2010 8:33 AM  Follow-up for Phone Call        I called and spoke with the pt. He states that he has been coughing up blood this morning. He says it is bright red and he has done this about 15 times since he has been awake. I explained I will call Dr. Juanda Chance to see what he suggests. I will call the pt back. Sherri Rad, RN, BSN  February 16, 2010 8:38 AM   I spoke with Dr. Juanda Chance- he wants the pt to see pulmonary today or tomorrow. I have spoken to pulmonary and the pt is scheduled to see Dr. Shelle Iron tomorrow at 2:00pm. The pt is to arrive at 1:45pm. I have called and explained this to the pt. I advised him to continue his coarse of antibiotics and prednisone as prescribed yesterday. He is also aware if the coughing up blood gets any worse, he should contact Dr. Cato Mulligan to be seen today. He verbalizes understanding. The pt denies any fever today. He states he had chills all last week and had to stay wrapped in a blanket.  Follow-up by: Sherri Rad, RN, BSN,  February 16, 2010 8:54 AM

## 2010-04-29 NOTE — Assessment & Plan Note (Signed)
Summary: rov  Medications Added ASPIRIN EC 325 MG TBEC (ASPIRIN) Take one tablet by mouth daily CHANTIX 1 MG TABS (VARENICLINE TARTRATE) take one tab by mouth once daily      Allergies Added:   Visit Type:  Follow-up Primary Provider:  Birdie Sons MD  CC:  pt trying to quit smoking-the hot weather causing pt to have increase sob.  History of Present Illness: Patient is 70 years old and return for management of CAD. He has coronary artery disease and has had multiple prior PCI procedures. He recently was admitted for unstable angina and was found to have three-vessel coronary disease. We thought this was best managed with surgery. However he is a smoker and I thought it was extremely important that he stop smoking prior to consideration for surgery.  Since that time he has done quite well. Over the past few weeks he's only had about 4 cigarettes. He's been using Chantix. He says he has had some bad dreams which he thinks may be related to this drug and he's cut back to once a day.  Current Medications (verified): 1)  Crestor 20 Mg Tabs (Rosuvastatin Calcium) .... 1/2 Every Bedtime 2)  Pantoprazole Sodium 40 Mg Tbec (Pantoprazole Sodium) .... Take 1 Tablet By Mouth Two Times A Day 3)  Tenormin 50 Mg Tabs (Atenolol) .... Take 2 Tablet By Mouth Daily As Directed By Physcian 4)  Triamcinolone Acetonide 0.025 % Oint (Triamcinolone Acetonide) .... Apply 1 A Small Amount To Affected Area Twice A Day 5)  Caltrate 600+d 600-400 Mg-Unit  Tabs (Calcium Carbonate-Vitamin D) .Marland Kitchen.. 1 Tab Once Daily 6)  Cvs Vitamin E 800 Unit  Caps (Vitamin E) .... Once Daily 7)  Aspirin Ec 325 Mg Tbec (Aspirin) .... Take One Tablet By Mouth Daily 8)  Norvasc 10 Mg  Tabs (Amlodipine Besylate) .... Take 1 Tablet By Mouth Once A Day 9)  Altace 10 Mg  Tabs (Ramipril) .... Take 1 Tablet By Mouth Once A Day 10)  Plavix 75 Mg  Tabs (Clopidogrel Bisulfate) .... Take 1 Tablet By Mouth Once A Day 11)  Spiriva Handihaler 18 Mcg   Caps (Tiotropium Bromide Monohydrate) .... One Inhaled Daily 12)  Ventolin Hfa 108 (90 Base) Mcg/act Aers (Albuterol Sulfate) .... 2 Inh Q4h As Needed Shortness of Breath 13)  Vitamin B12 Shot .Marland Kitchen.. 1 Shot Monthly 14)  Oxycodone-Acetaminophen 5-500 Mg Caps (Oxycodone-Acetaminophen) .... Take 1-2 Every 4 Hours As Needed 15)  Forteo 600 Mcg/2.60ml Soln (Teriparatide (Recombinant)) .... Inject Daily As Directed 16)  Pen Needles 5/16" 31g X 8 Mm Misc (Insulin Pen Needle) .... Use Daily As Directed With Forteo 17)  Imdur 60 Mg Xr24h-Tab (Isosorbide Mononitrate) .... Take 1 Tablet Daily After Lunch 18)  Dicyclomine Hcl 20 Mg Tabs (Dicyclomine Hcl) .... Take One Daily 19)  Tamsulosin Hcl 0.4 Mg Caps (Tamsulosin Hcl) .... Take 1 Tablet By Mouth Once A Day 20)  Chantix 1 Mg Tabs (Varenicline Tartrate) .... Take One Tab By Mouth Once Daily  Allergies (verified): 1)  ! Ticlid  Past History:  Past Medical History: Reviewed history from 12/02/2008 and no changes required. Allergic rhinitis Osteoporosis 1. Coronary artery disease status post multiple percutaneous coronary     inventions with nonobstructive disease at last catheterization. 2. Peripheral vascular status post previous stenting of the totally     occluded iliac and status post stenting of an external iliac in     January 2009 with recurrent symptoms of questionable claudication. 3. Hypertension. 4. Hyperlipidemia.  5. History of amaurosis fugax 1998. 6. Gastroesophageal reflux disease.  anemia, B12 deficiency  Review of Systems       ROS is negative except as outlined in HPI.   Vital Signs:  Patient profile:   70 year old male Height:      65 inches Weight:      167 pounds Pulse rate:   65 / minute BP sitting:   140 / 76  (left arm) Cuff size:   regular  Vitals Entered By: Burnett Kanaris, CNA (September 02, 2009 9:51 AM)  Physical Exam  Additional Exam:  Gen. Well-nourished, in no distress   Neck: No JVD, thyroid not  enlarged, no carotid bruits Lungs: No tachypnea, clear without rales, rhonchi or wheezes Cardiovascular: Rhythm regular, PMI not displaced,  heart sounds  normal, no murmurs or gallops, no peripheral edema, pulses normal in all 4 extremities. Abdomen: BS normal, abdomen soft and non-tender without masses or organomegaly, no hepatosplenomegaly. MS: No deformities, no cyanosis or clubbing   Neuro:  No focal sns   Skin:  no lesions    Impression & Recommendations:  Problem # 1:  CORONARY ARTERY DISEASE (ICD-414.00)  The patient has had multiple previous PCI procedures and had recent unstable angina and was found to have three-vessel disease. Without revascularization could best be achieved with surgery. He has been a smoker but is unable to cut back and has only had 3 cigarettes the last few weeks. His angina has been better and is not having very much pain. I still think surgery is probably his best option and we'll plan surgical consultation. His updated medication list for this problem includes:    Tenormin 50 Mg Tabs (Atenolol) .Marland Kitchen... Take 2 tablet by mouth daily as directed by physcian    Aspirin Ec 325 Mg Tbec (Aspirin) .Marland Kitchen... Take one tablet by mouth daily    Norvasc 10 Mg Tabs (Amlodipine besylate) .Marland Kitchen... Take 1 tablet by mouth once a day    Altace 10 Mg Tabs (Ramipril) .Marland Kitchen... Take 1 tablet by mouth once a day    Plavix 75 Mg Tabs (Clopidogrel bisulfate) .Marland Kitchen... Take 1 tablet by mouth once a day    Imdur 60 Mg Xr24h-tab (Isosorbide mononitrate) .Marland Kitchen... Take 1 tablet daily after lunch  Orders: TCTS Referral (TCTS Ref) EKG w/ Interpretation (93000)  Problem # 2:  TOBACCO USE (ICD-305.1) He has made great progress and has only had 3 cigarettes in the last 2-3 weeks. He is taking Chantix once daily.  Problem # 3:  HYPERTENSION (ICD-401.9)  His blood pressure is well controlled on current medications. His updated medication list for this problem includes:    Tenormin 50 Mg Tabs (Atenolol)  .Marland Kitchen... Take 2 tablet by mouth daily as directed by physcian    Aspirin Ec 325 Mg Tbec (Aspirin) .Marland Kitchen... Take one tablet by mouth daily    Norvasc 10 Mg Tabs (Amlodipine besylate) .Marland Kitchen... Take 1 tablet by mouth once a day    Altace 10 Mg Tabs (Ramipril) .Marland Kitchen... Take 1 tablet by mouth once a day  His updated medication list for this problem includes:    Tenormin 50 Mg Tabs (Atenolol) .Marland Kitchen... Take 2 tablet by mouth daily as directed by physcian    Aspirin Ec 325 Mg Tbec (Aspirin) .Marland Kitchen... Take one tablet by mouth daily    Norvasc 10 Mg Tabs (Amlodipine besylate) .Marland Kitchen... Take 1 tablet by mouth once a day    Altace 10 Mg Tabs (Ramipril) .Marland Kitchen... Take 1 tablet by  mouth once a day  Problem # 4:  PERIPHERAL VASCULAR DISEASE (ICD-443.9) Hhas had previous iliac stent procedures. He's had no claudication in his problem appears stable.  Patient Instructions: 1)  You are being referred to the Cardiac Surgeon- we are requesting that Dr. Laneta Simmers see you. We will contact you with this appointment. 2)  Your physician recommends that you continue on your current medications as directed. Please refer to the Current Medication list given to you today.

## 2010-04-29 NOTE — Medication Information (Signed)
Summary: Prior Authorization Request for Forteo Pen [facility]/[employer]  Prior Authorization Request for Forteo Pen Injctr/[employer]   Imported By: Maryln Gottron 04/13/2009 13:30:19  _____________________________________________________________________  External Attachment:    Type:   Image     Comment:   External Document

## 2010-04-29 NOTE — Progress Notes (Signed)
Summary: REQ FOR APPT (B12 INJ)  Phone Note Call from Patient   Caller: Patient @ [phone] Summary of Call: Pt called to see if he could come into the office today or tomorrow for B12 inj w/ Alvino Chapel, RN..... Dr Cato Mulligan schedule n/a because he will be out of the office but pt wants to have B12 inj within the next day or two...Marland KitchenMarland KitchenMarland Kitchen Pt can be reached at home: [phone] or on his cell: [phone].   Can you advise?  Initial call taken by: Debbra Riding,  May 05, 2009 9:06 AM  Follow-up for Phone Call        see if he will come on Thurs and just double book the one already there.  If he insists, he can come this am.  I will not be here tomorrow. Follow-up by: Gladis Riffle, RN,  May 05, 2009 9:44 AM  Additional Follow-up for Phone Call Additional follow up Details #1::        Phone Call Completed ------ Contacted pt.... appt was scheduled for Thursday, 05/07/2009 @ 1:15pm for B12 injection.  Additional Follow-up by: Debbra Riding,  May 05, 2009 10:56 AM

## 2010-04-29 NOTE — Medication Information (Signed)
Summary: Insurance Investigation complete-Rx Sent/[facility], materials Investigation complete-Rx Sent/[facility] [facility]   Imported By: Maryln Gottron 04/06/2009 15:22:27  _____________________________________________________________________  External Attachment:    Type:   Image     Comment:   External Document

## 2010-04-29 NOTE — Assessment & Plan Note (Signed)
Summary: 2 month rov  Medications Added RANEXA 500 MG XR12H-TAB (RANOLAZINE) Take one tablet by mouth twice a day        Visit Type:  2 mo f/u Primary Provider:  Birdie Sons MD  CC:  sob.Marland Kitchenedema/ankles.Marland Kitchendenies any cp.  History of Present Illness: Patient is 70 years old and return for management of CAD. He has had multiple part his coronary interventions. He underwent catheterization last in January and was found to have mostly nonobstructive disease all he did have an 80% narrowing in a ramus branch the circumflex artery. Had a Myoview scan which was negative for ischemia. He saw Wende Bushy in January and now returns for followup.  He is still having left shoulder and back pain which is not related to exertion but also not related to position. He says he does get relief with nitroglycerin.  Current Medications (verified): 1)  Crestor 20 Mg Tabs (Rosuvastatin Calcium) .... 1/2 Every Bedtime 2)  Pantoprazole Sodium 40 Mg Tbec (Pantoprazole Sodium) .... Take 1 Tablet By Mouth Two Times A Day 3)  Tenormin 50 Mg Tabs (Atenolol) .... Take 2 Tablet By Mouth Daily 4)  Triamcinolone Acetonide 0.025 % Oint (Triamcinolone Acetonide) .... Apply 1 A Small Amount To Affected Area Twice A Day 5)  Caltrate 600+d 600-400 Mg-Unit  Tabs (Calcium Carbonate-Vitamin D) .Marland Kitchen.. 1 Tab Once Daily 6)  Cvs Vitamin E 800 Unit  Caps (Vitamin E) .... Once Daily 7)  Aspirin 81 Mg  Tbec (Aspirin) .... Once Daily 8)  Norvasc 10 Mg  Tabs (Amlodipine Besylate) .... Take 1 Tablet By Mouth Once A Day 9)  Altace 10 Mg  Tabs (Ramipril) .... Take 1 Tablet By Mouth Once A Day 10)  Plavix 75 Mg  Tabs (Clopidogrel Bisulfate) .... Take 1 Tablet By Mouth Once A Day 11)  Spiriva Handihaler 18 Mcg  Caps (Tiotropium Bromide Monohydrate) .... One Inhaled Daily 12)  Ventolin Hfa 108 (90 Base) Mcg/act Aers (Albuterol Sulfate) .... 2 Inh Q4h As Needed Shortness of Breath 13)  Vitamin B12 Shot .Marland Kitchen.. 1 Shot Monthly 14)   Oxycodone-Acetaminophen 5-500 Mg Caps (Oxycodone-Acetaminophen) .... Take 1-2 Every 4 Hours As Needed 15)  Forteo 600 Mcg/2.89ml Soln (Teriparatide (Recombinant)) .... Inject Daily As Directed 16)  Pen Needles 5/16" 31g X 8 Mm Misc (Insulin Pen Needle) .... Use Daily As Directed With Forteo 17)  Imdur 30 Mg Xr24h-Tab (Isosorbide Mononitrate) .... Take One Daily 18)  Dicyclomine Hcl 20 Mg Tabs (Dicyclomine Hcl) .... Take One Daily  Allergies: 1)  ! Ticlid  Past History:  Past Medical History: Reviewed history from 12/02/2008 and no changes required. Allergic rhinitis Osteoporosis 1. Coronary artery disease status post multiple percutaneous coronary     inventions with nonobstructive disease at last catheterization. 2. Peripheral vascular status post previous stenting of the totally     occluded iliac and status post stenting of an external iliac in     January 2009 with recurrent symptoms of questionable claudication. 3. Hypertension. 4. Hyperlipidemia. 5. History of amaurosis fugax 1998. 6. Gastroesophageal reflux disease.  anemia, B12 deficiency  Review of Systems       ROS is negative except as outlined in HPI.   Vital Signs:  Patient profile:   70 year old male Height:      66 inches Weight:      169 pounds BMI:     27.38 Pulse rate:   68 / minute Pulse rhythm:   regular BP sitting:   100 /  70  (left arm) Cuff size:   regular  Vitals Entered By: Danielle Rankin, CMA (June 23, 2009 8:48 AM)  Physical Exam  Additional Exam:  Gen. Well-nourished, in no distress   Neck: No JVD, thyroid not enlarged, no carotid bruits Lungs: No tachypnea, clear without rales, rhonchi or wheezes Cardiovascular: Rhythm regular, PMI not displaced,  heart sounds  normal, no murmurs or gallops, no peripheral edema, pulses normal in all 4 extremities. Abdomen: BS normal, abdomen soft and non-tender without masses or organomegaly, no hepatosplenomegaly. MS: No deformities, no cyanosis or  clubbing   Neuro:  No focal sns   Skin:  no lesions    Impression & Recommendations:  Problem # 1:  CHEST PAIN UNSPECIFIED (ICD-786.50) His chest pain is somewhat atypical for ischemia and is not related to exertion it is relieved with nitroglycerin. Had a negative Myoview scan following his catheterization but he did have 80% narrowing in a ramus branch the circumflex artery. I'm not certain if his pain is anginal. We'll give him a trial of her next and see if this makes a difference. We'll start him on 500 b.i.d. and increasing to 1000 b.i.d. if he is not improved after 10-14 days. We'll give him prescription for 500 b.i.d. or 1000 b.i.d. depending what works. He is no better then we'll stop this. His updated medication list for this problem includes:    Tenormin 50 Mg Tabs (Atenolol) .Marland Kitchen... Take 2 tablet by mouth daily    Aspirin 81 Mg Tbec (Aspirin) ..... Once daily    Norvasc 10 Mg Tabs (Amlodipine besylate) .Marland Kitchen... Take 1 tablet by mouth once a day    Altace 10 Mg Tabs (Ramipril) .Marland Kitchen... Take 1 tablet by mouth once a day    Plavix 75 Mg Tabs (Clopidogrel bisulfate) .Marland Kitchen... Take 1 tablet by mouth once a day    Imdur 30 Mg Xr24h-tab (Isosorbide mononitrate) .Marland Kitchen... Take one daily    Ranexa 500 Mg Xr12h-tab (Ranolazine) .Marland Kitchen... Take one tablet by mouth twice a day  Problem # 2:  CORONARY ARTERY DISEASE (ICD-414.00) He has documented CAD with previous PCI's as described in history of present illness. We'll plan further change in his therapy as outlined above. His updated medication list for this problem includes:    Tenormin 50 Mg Tabs (Atenolol) .Marland Kitchen... Take 2 tablet by mouth daily    Aspirin 81 Mg Tbec (Aspirin) ..... Once daily    Norvasc 10 Mg Tabs (Amlodipine besylate) .Marland Kitchen... Take 1 tablet by mouth once a day    Altace 10 Mg Tabs (Ramipril) .Marland Kitchen... Take 1 tablet by mouth once a day    Plavix 75 Mg Tabs (Clopidogrel bisulfate) .Marland Kitchen... Take 1 tablet by mouth once a day    Imdur 30 Mg Xr24h-tab  (Isosorbide mononitrate) .Marland Kitchen... Take one daily    Ranexa 500 Mg Xr12h-tab (Ranolazine) .Marland Kitchen... Take one tablet by mouth twice a day  His updated medication list for this problem includes:    Tenormin 50 Mg Tabs (Atenolol) .Marland Kitchen... Take 2 tablet by mouth daily    Aspirin 81 Mg Tbec (Aspirin) ..... Once daily    Norvasc 10 Mg Tabs (Amlodipine besylate) .Marland Kitchen... Take 1 tablet by mouth once a day    Altace 10 Mg Tabs (Ramipril) .Marland Kitchen... Take 1 tablet by mouth once a day    Plavix 75 Mg Tabs (Clopidogrel bisulfate) .Marland Kitchen... Take 1 tablet by mouth once a day    Imdur 30 Mg Xr24h-tab (Isosorbide mononitrate) .Marland Kitchen... Take one daily  Problem #  3:  HYPERLIPIDEMIA (ICD-272.4) He has not had his lipid profile checked in some time so we'll plan to repeat this. His updated medication list for this problem includes:    Crestor 20 Mg Tabs (Rosuvastatin calcium) .Marland Kitchen... 1/2 every bedtime  Orders: TLB-Hepatic/Liver Function Pnl (80076-HEPATIC) TLB-Lipid Panel (80061-LIPID)  His updated medication list for this problem includes:    Crestor 20 Mg Tabs (Rosuvastatin calcium) .Marland Kitchen... 1/2 every bedtime  Patient Instructions: 1)  Your physician recommends that you schedule a follow-up appointment in: 6 weeks 2)  Your physician recommends that you have lab work today: lipid/liver (414.01;272.2) 3)  Your physician has recommended you make the following change in your medication: 1) Start Ranexa 500mg  two times a day for 10 days. 4)  Dr. Regino Schultze nurse, Herbert Seta, will call you in about 10 days to followup on how you are doing with the Ranexa.

## 2010-04-29 NOTE — Letter (Signed)
Summary: Cardiac Catheterization Instructions- [facility]  [facility], Main Office  [address]   [place], [place] [address]   Phone: [phone]  Fax: [phone]     03/30/2009 MRN: [id number]  [name] [address] [place], [place]  [address]  Dear [name],   You are scheduled for a Cardiac Catheterization on Wed. 04/01/09 with Dr. Juanda Chance  Please arrive to the 1st floor of the [facility] at [facility] at 6:30 am on the day of your procedure. Please do not arrive before 6:30 a.m. Call the Heart and Vascular Center at [phone] if you are unable to make your appointmnet. The Code to get into the parking garage under the building is 0090. Take the elevators to the 1st floor. You must have someone to drive you home. Someone must be with you for the first 24 hours after you arrive home. Please wear clothes that are easy to get on and off and wear slip-on shoes. Do not eat or drink after midnight except water with your medications that morning. Bring all your medications and current insurance cards with you.  ___ DO NOT take these medications before your procedure: ________________________________________________________________  _x_ Make sure you take your aspirin.  _x_ You may take ALL of your medications with water that morning. ________________________________________________________________________________________________________________________________  ___ DO NOT take ANY medications before your procedure.  ___ Pre-med instructions:  ________________________________________________________________________________________________________________________________  The usual length of stay after your procedure is 2 to 3 hours. This can vary.  If you have any questions, please call the office at the number listed above.   Sherri Rad, RN, BSN

## 2010-05-10 ENCOUNTER — Ambulatory Visit: Payer: Self-pay | Admitting: Internal Medicine

## 2010-05-11 ENCOUNTER — Encounter: Payer: Self-pay | Admitting: Internal Medicine

## 2010-05-11 ENCOUNTER — Ambulatory Visit (INDEPENDENT_AMBULATORY_CARE_PROVIDER_SITE_OTHER): Payer: Medicare Other | Admitting: Internal Medicine

## 2010-05-11 DIAGNOSIS — D518 Other vitamin B12 deficiency anemias: Secondary | ICD-10-CM

## 2010-05-11 MED ORDER — CYANOCOBALAMIN 1000 MCG/ML IJ SOLN
1000.0000 ug | Freq: Once | INTRAMUSCULAR | Status: AC
  Administered 2010-05-11: 1000 ug via INTRAMUSCULAR

## 2010-05-14 ENCOUNTER — Ambulatory Visit (INDEPENDENT_AMBULATORY_CARE_PROVIDER_SITE_OTHER): Payer: Medicare Other | Admitting: Internal Medicine

## 2010-05-14 ENCOUNTER — Encounter: Payer: Self-pay | Admitting: Internal Medicine

## 2010-05-14 DIAGNOSIS — D518 Other vitamin B12 deficiency anemias: Secondary | ICD-10-CM

## 2010-05-14 DIAGNOSIS — E785 Hyperlipidemia, unspecified: Secondary | ICD-10-CM

## 2010-05-14 DIAGNOSIS — I10 Essential (primary) hypertension: Secondary | ICD-10-CM

## 2010-05-14 DIAGNOSIS — F17200 Nicotine dependence, unspecified, uncomplicated: Secondary | ICD-10-CM

## 2010-05-14 LAB — BASIC METABOLIC PANEL
BUN: 11 mg/dL (ref 6–23)
CO2: 33 mEq/L — ABNORMAL HIGH (ref 19–32)
Chloride: 103 mEq/L (ref 96–112)
Glucose, Bld: 126 mg/dL — ABNORMAL HIGH (ref 70–99)
Potassium: 4.6 mEq/L (ref 3.5–5.1)
Sodium: 143 mEq/L (ref 135–145)

## 2010-05-14 LAB — CBC WITH DIFFERENTIAL/PLATELET
Basophils Relative: 0.3 % (ref 0.0–3.0)
Eosinophils Relative: 2 % (ref 0.0–5.0)
HCT: 44.9 % (ref 39.0–52.0)
Hemoglobin: 15.1 g/dL (ref 13.0–17.0)
Lymphs Abs: 1.5 10*3/uL (ref 0.7–4.0)
MCV: 96.2 fl (ref 78.0–100.0)
Monocytes Absolute: 0.7 10*3/uL (ref 0.1–1.0)
Monocytes Relative: 9.1 % (ref 3.0–12.0)
Platelets: 220 10*3/uL (ref 150.0–400.0)
RBC: 4.67 Mil/uL (ref 4.22–5.81)
WBC: 7.7 10*3/uL (ref 4.5–10.5)

## 2010-05-14 LAB — HEPATIC FUNCTION PANEL
Albumin: 3.9 g/dL (ref 3.5–5.2)
Total Bilirubin: 0.6 mg/dL (ref 0.3–1.2)

## 2010-05-14 LAB — LIPID PANEL
HDL: 39.6 mg/dL (ref >39.00)
LDL Cholesterol: 61 mg/dL (ref 0–99)
Total CHOL/HDL Ratio: 3
VLDL: 34.2 mg/dL (ref 0.0–40.0)

## 2010-05-14 MED ORDER — LOSARTAN POTASSIUM 100 MG PO TABS: 100.0000 mg | ORAL_TABLET | Freq: Every day | ORAL | Status: DC

## 2010-05-14 NOTE — Assessment & Plan Note (Signed)
He has quit---still has a deep desire to smoke

## 2010-05-14 NOTE — Assessment & Plan Note (Signed)
Check labs today Previously adequate control

## 2010-05-14 NOTE — Assessment & Plan Note (Signed)
Not as well controlled See new medications Side effects discussed---trial losartan

## 2010-05-14 NOTE — Progress Notes (Signed)
  Subjective:    Patient ID: [name], male    DOB: 1941-03-08, 70 y.o.   MRN: [id number]  HPI   patient comes in for followup of multiple medical problems including hyperlipidemia, B12 deficiency, hypertension, history of tobacco abuse, coronary artery disease.  Patient also has known COPD. I have reviewed [name] note. Patient is feeling better. He denies any shortness of breath with exertion. Denies any chest pain. He does admit that his blood pressure medications have been modifiedand he has not been checking his blood pressures at home.  Past Medical History  Diagnosis Date  . HYPERLIPIDEMIA 11/21/2006  . HYPERTENSION 11/21/2006  . MYOCARDIAL INFARCTION, HX OF 11/21/2006  . CORONARY ARTERY DISEASE 11/21/2006  . OSTEOPOROSIS 11/21/2006  . COLONIC POLYPS 05/21/2007  . COPD 05/21/2007  . HIATAL HERNIA 05/21/2007  . DIVERTICULAR DISEASE 05/21/2007  . NEPHROLITHIASIS 05/21/2007  . GI BLEEDING 09/09/2008  . ANEMIA, B12 DEFICIENCY 12/02/2008  . CAROTID ARTERY STENOSIS 04/16/2009  . PERIPHERAL VASCULAR DISEASE 09/02/2009  . Atrial fibrillation 11/06/2009   Past Surgical History  Procedure Date  . Thyroidectomy   . Coronary stent placement     03/07/2001  . Cholecystectomy   . Appendectomy   . Hemorrhoid surgery   . Inguinal hernia repair 10/10/08  . Coronary artery bypass graft   . Ptca     reports that he quit smoking about 7 months ago. His smoking use included Cigarettes. He does not have any smokeless tobacco history on file. He reports that he drinks alcohol. He reports that he does not use illicit drugs. family history includes Arthritis in his mother; Heart attack in his father; and Heart disease in his brothers and father.  There is no history of Colon cancer.      Review of Systems  patient denies chest pain, shortness of breath, orthopnea. Denies lower extremity edema, abdominal pain, change in appetite, change in bowel movements. Patient denies rashes, musculoskeletal  complaints. No other specific complaints in a complete review of systems.      Objective:   Physical Exam  well-developed well-nourished male in no acute distress. HEENT exam atraumatic, normocephalic, neck supple without jugular venous distention. Chest clear to auscultation cardiac exam S1-S2 are regular. Abdominal exam overweight with bowel sounds, soft and nontender. Extremities no edema. Neurologic exam is alert with a normal gait.        Assessment & Plan:

## 2010-05-14 NOTE — Assessment & Plan Note (Signed)
Monthly b12 injections 

## 2010-05-15 ENCOUNTER — Other Ambulatory Visit: Payer: Self-pay | Admitting: Internal Medicine

## 2010-05-21 ENCOUNTER — Ambulatory Visit (INDEPENDENT_AMBULATORY_CARE_PROVIDER_SITE_OTHER): Payer: Medicare Other | Admitting: Internal Medicine

## 2010-05-21 ENCOUNTER — Encounter: Payer: Self-pay | Admitting: Internal Medicine

## 2010-05-21 DIAGNOSIS — J449 Chronic obstructive pulmonary disease, unspecified: Secondary | ICD-10-CM

## 2010-05-25 NOTE — Assessment & Plan Note (Signed)
Summary: Pulmonary/ ext f/u with hfa 75% p coaching   Copy to:  Dr. Charlies Constable Primary Provider/Referring Provider:  Birdie Sons MD  CC:  breathing is uncanged. pt c/o hoarsness and occas cough.  History of Present Illness: 70  yowm quit smoking July 2011 at CABG and did fine in terms of improved ex tol > walk easily [place]  [place].   February 16, 2010  1st pulmonary office eval   cc sob x 3 weeks first noticed walking up incline then cough onset x 2 weeks with flu like symptoms with white mucus mucus then turned bloody x 24 hours with maybe 2 tbs of blood, no purulent or putrid sputum, with cxr 11/21 at [facility] suggesting  pna in lingula,  new since 11/3 so started on amox and referred to pulmonary clinic.  has mild sore throat, no choking or obvious asp, had dental work done about 2 weeks before onset of symptoms.   He has had intermittent  Left sided CP x years post on R intermittent related to rib fx from coughing. No recent change.  rec ct neg for pulmonary emboli or ca.  Stop altace Stop aspirin until no bleeding for 3 days  Stop amoxiicillin and taek augmentin 875 with bfeast and supper and yogurt for lunch Please schedule a follow-up appointment in 10 days, sooner if needed  with cxr on return Add pepcid 20 mg one at bedtime GERD (REFLUX) diet Late Add:  pft's c/w copd /ab and cxr's inconclusiive, so CTangiogram done and ok rxv symbicort 160 2 puffs first thing  in am and 2 puffs again in pm about 12 hours   February 25, 2010 ov cough better, hasn't tried much outdoor activity yet, no more hemoptysis. >>d/c ace/norvasc, rx bystolic. tx w/ PPI/pepcid , xray showed Persistent patchy and nodular airspace disease in the left lung.March 11, 2010--Presents for follow up and med review. He has brought all his meds in today. He is taking symbicort only once daily (not two times a day ) and has not started on pepcid at bedtime. Last ov changed off ace /norvasc now on bystolic.  Cough is better w/ no further hemoptysis but not totally gone. Xray last ov showed persistent  patchy and nodular airspace disease in the left lung.  I will call with chest xray results.  Follow med calenadar closley and bring to each visit.  add pepcid 20mg  at bedtime  Increase Symbicort 2 puffs two times a day  April 23, 2010 ov cough and sob no better and throat very sore, no better p rx with rx as uri with doxy. mild dysphagia, mod hoarseness rec .stop symbicort Try xopenex in place of ventolin Dulera 100 2 puffs first thing  in am and 2 puffs again in pm about 12 hours later and then brush with arm and Hammer toothpaste Work on inhaler technique:  relax and blow all the way out then take a nice smooth deep breath back in, triggering the inhaler at same time you start breathing in See calendar for specific medication instructions and bring it back for each and every office visit for every healthcare provider you see.  Without it,  you may not receive the best quality medical care that we feel you deserve.  Please schedule a follow-up appointment in 4 weeks, sooner if needed with inhalers in hand  May 21, 2010 ov sob better but hoarse no change. no purulent sputum or noct co's or increase need  for saba. Pt denies any significant sore throat, dysphagia, itching, sneezing,  nasal congestion or excess secretions,  fever, chills, sweats, unintended wt loss, pleuritic or exertional cp, hempoptysis, change in activity tolerance  orthopnea pnd or leg swelling Pt also denies any obvious fluctuation in symptoms with weather or environmental change or other alleviating or aggravating factors.         Current Medications (verified): 1)  Crestor 20 Mg Tabs (Rosuvastatin Calcium) .... 1/2 Tab Once Daily 2)  Furosemide 20 Mg Tabs (Furosemide) .... Take One Tablet By Mouth Daily. 3)  Pantoprazole Sodium 40 Mg Tbec (Pantoprazole Sodium) .... Take  One 30-60 Min Before First Meal of The Day 4)  Aspirin  81 Mg  Tabs (Aspirin) .... Take 1 Tablet By Mouth Once A Day 5)  Bystolic 20 Mg Tabs (Nebivolol Hcl) .... One Half Daily 6)  Spiriva Handihaler 18 Mcg  Caps (Tiotropium Bromide Monohydrate) .... One Inhaled Daily 7)  Caltrate 600+d 600-400 Mg-Unit  Tabs (Calcium Carbonate-Vitamin D) .Marland Kitchen.. 1 Tab Once Daily 8)  Forteo 600 Mcg/2.93ml Soln (Teriparatide (Recombinant)) .... Inject Daily As Directed 9)  Cyanocobalamin 1000 Mcg/ml Inj Soln (Cyanocobalamin) .... Inject 1 Cc Intramuscularly Monthly 10)  Pepcid 20 Mg Tabs (Famotidine) .... Take One By Mouth At Bedtime 11)  Triamcinolone Acetonide 0.1 % Crea (Triamcinolone Acetonide) .... Apply As Needed 12)  Tums 500 Mg Chew (Calcium Carbonate Antacid) .... As Needed 13)  Xopenex Hfa 45 Mcg/act Aero (Levalbuterol Tartrate) .... Inhale 2 Puffs Every 4 Hrs As Needed 14)  Mucinex Dm 30-600 Mg Xr12h-Tab (Dextromethorphan-Guaifenesin) .Marland Kitchen.. 1 Two Times A Day As Needed 15)  Pen Needles 5/16" 31g X 8 Mm Misc (Insulin Pen Needle) .... Use Daily As Directed With Forteo 16)  Dulera 100-5 Mcg/act Aero (Mometasone Furo-Formoterol Fum) .... 2 Puffs First Thing  in Am and 2 Puffs Again in Pm About 12 Hours Later 17)  Losartan Potassium 100 Mg Tabs (Losartan Potassium) .... Once Daily  Allergies (verified): 1)  ! Ticlid  Past History:  Past Medical History: Allergic rhinitis Osteoporosis 1. Coronary artery disease status post multiple percutaneous coronary     inventions  > CABG 09/2009 2. Peripheral vascular status post previous stenting of the totally     occluded iliac and status post stenting of an external iliac in     January 2009 with recurrent symptoms of questionable claudication. 3. Hypertension. 4. Hyperlipidemia. 5. History of amaurosis fugax 1998. 6. Gastroesophageal reflux disease.  anemia, B12 deficiency AFIB--post CABG Atrial fibrillation COPD     - PFT's 01/28/10  FEV1 1.16 46%  ratio 44  with 27% improvement p B2,  DLCO 76%     - HFA  50% p coaching February 25, 2010 >  75% April 23, 2010  Hemoptysis onset 01/2010     - CT chest 02/19/10 : no PE, no ca/ nodular infiltrates  COMPLEX MED REGIMEN Meds reviewed with pt education and computerized med calendar completed March 11, 2010   Vital Signs:  Patient profile:   70 year old male Height:      65 inches Weight:      173.38 pounds BMI:     28.96 O2 Sat:      93 % on Room air Temp:     97.5 degrees F oral Pulse rate:   68 / minute BP sitting:   134 / 76  (left arm) Cuff size:   regular  Vitals Entered By: Carver Fila (May 21, 2010 8:47  AM)  O2 Flow:  Room air CC: breathing is uncanged. pt c/o hoarsness, occas cough Comments meds and allergies updated Phone number updated Carver Fila  May 21, 2010 8:47 AM    Physical Exam  Additional Exam:  pleasant amb wm nad mild/mod hoarse.  wt 167 February 16, 2010  >  172 April 24, 2010 > 173 May 22, 2010  HEENT: nl dentition, turbinates,  Nl external ear canals without cough reflex. Nonspecific erythema of oropharanx, no thrush NECK :  without JVD/Nodes/TM/ nl carotid upstrokes bilaterally LUNGS: no acc muscle use, clear to A and P bilaterally without cough on insp or exp maneuvers, mod increase Texp CV:  RRR  no s3 or murmur,  no  increase in P2, no edema  ABD:  soft and nontender with nl excursion in the supine position. No bruits or organomegaly, bowel sounds nl MS:  warm without deformities, calf tenderness, cyanosis or clubbing     Impression & Recommendations:  Problem # 1:  COPD (ICD-496) GOLD III with most of his symptoms however upper airway in nature in a pt with 27% reversibility would ideally like to keep him on max dose ICS here but not tolerating it well due to upper airway concerns.    DDX of  difficult airways managment all start with A and  include Adherence, Ace Inhibitors, Acid Reflux, Active Sinus Disease, Alpha 1 Antitripsin deficiency, Anxiety masquerading as Airways dz,   ABPA,  allergy(esp in young), Aspiration (esp in elderly), Adverse effects of DPI,  Active smokers, plus two Bs  = Bronchiectasis and Beta blocker use..and one C= CHF    Adherence better with use of med calendar. I spent extra time with the patient today explaining optimal mdi  technique.  This improved from  50-75% continue dulera, add spacer if needed   ? adverse dpi reaction: consider stopping spiriva next ov as this is more asthmatic than emphysemic copd  ? acid reflux rx/ diet reviwed   Each maintenance medication was reviewed in detail including most importantly the difference between maintenance and as needed and under what circumstances the prns are to be used. This was done in the context of a medication calendar review which provided the patient with a user-friendly unambiguous mechanism for medication administration and reconciliation and provides an action plan for all active problems. It is critical that this be shown to every doctor  for modification during the office visit if necessary so the patient can use it as a working document.   Medications Added to Medication List This Visit: 1)  Xopenex Hfa 45 Mcg/act Aero (Levalbuterol tartrate) .... Inhale 2 puffs every 4 hrs as needed 2)  Losartan Potassium 100 Mg Tabs (Losartan potassium) .... Once daily  Other Orders: Prescription Created Electronically [phone]) Est. Patient Level III (02725) HFA Instruction [phone])  Patient Instructions: 1)  Work on inhaler technique:  relax and blow all the way out then take a nice smooth deep breath back in, triggering the inhaler at same time you start breathing in  - if still hoarse ok to try to using the spacer just with dulera 2)  Brush teeth with arm and hammer toothpast after dulera 3)  See calendar for specific medication instructions and bring it back for each and every office visit for every healthcare provider you see.  Without it,  you may not receive the best quality medical care  that we feel you deserve.  4)  Return to office in 3 months, sooner if  needed  Prescriptions: XOPENEX HFA 45 MCG/ACT AERO (LEVALBUTEROL TARTRATE) inhale 2 puffs every 4 hrs as needed  #1 x 11   Entered and Authorized by:   Nyoka Cowden MD   Signed by:   Nyoka Cowden MD on 05/21/2010   Method used:   Electronically to        [facility]  [place] [address] (retail)       [address]       [place], [place]  [address]       Ph: [phone] or [phone]       Fax: [phone]   RxID:   [id number] DULERA 100-5 MCG/ACT AERO (MOMETASONE FURO-FORMOTEROL FUM) 2 puffs first thing  in am and 2 puffs again in pm about 12 hours later  #1 x 11   Entered and Authorized by:   Nyoka Cowden MD   Signed by:   Nyoka Cowden MD on 05/21/2010   Method used:   Electronically to        [facility]  [place] [address] (retail)       [address]       [place], [place]  [address]       Ph: [phone] or [phone]       Fax: [phone]   RxID:   [id number]

## 2010-06-08 ENCOUNTER — Ambulatory Visit: Payer: 59 | Admitting: Internal Medicine

## 2010-06-12 LAB — POCT I-STAT 4, (NA,K, GLUC, HGB,HCT)
Glucose, Bld: 121 mg/dL — ABNORMAL HIGH (ref 70–99)
Glucose, Bld: 92 mg/dL (ref 70–99)
HCT: 25 % — ABNORMAL LOW (ref 39.0–52.0)
HCT: 28 % — ABNORMAL LOW (ref 39.0–52.0)
HCT: 32 % — ABNORMAL LOW (ref 39.0–52.0)
Hemoglobin: 10.9 g/dL — ABNORMAL LOW (ref 13.0–17.0)
Hemoglobin: 7.5 g/dL — ABNORMAL LOW (ref 13.0–17.0)
Hemoglobin: 8.5 g/dL — ABNORMAL LOW (ref 13.0–17.0)
Potassium: 4 mEq/L (ref 3.5–5.1)
Potassium: 4.3 mEq/L (ref 3.5–5.1)
Sodium: 135 mEq/L (ref 135–145)
Sodium: 137 mEq/L (ref 135–145)
Sodium: 138 mEq/L (ref 135–145)
Sodium: 139 mEq/L (ref 135–145)

## 2010-06-12 LAB — CBC
HCT: 26.1 % — ABNORMAL LOW (ref 39.0–52.0)
HCT: 27.2 % — ABNORMAL LOW (ref 39.0–52.0)
HCT: 27.2 % — ABNORMAL LOW (ref 39.0–52.0)
HCT: 29.7 % — ABNORMAL LOW (ref 39.0–52.0)
Hemoglobin: 8.1 g/dL — ABNORMAL LOW (ref 13.0–17.0)
Hemoglobin: 8.4 g/dL — ABNORMAL LOW (ref 13.0–17.0)
Hemoglobin: 8.7 g/dL — ABNORMAL LOW (ref 13.0–17.0)
Hemoglobin: 8.7 g/dL — ABNORMAL LOW (ref 13.0–17.0)
Hemoglobin: 8.8 g/dL — ABNORMAL LOW (ref 13.0–17.0)
Hemoglobin: 9.7 g/dL — ABNORMAL LOW (ref 13.0–17.0)
MCH: 27 pg (ref 26.0–34.0)
MCH: 27 pg (ref 26.0–34.0)
MCH: 27.3 pg (ref 26.0–34.0)
MCHC: 32.1 g/dL (ref 30.0–36.0)
MCHC: 32.1 g/dL (ref 30.0–36.0)
MCHC: 32.3 g/dL (ref 30.0–36.0)
MCHC: 32.3 g/dL (ref 30.0–36.0)
MCHC: 32.5 g/dL (ref 30.0–36.0)
MCHC: 32.6 g/dL (ref 30.0–36.0)
MCV: 83.8 fL (ref 78.0–100.0)
MCV: 83.8 fL (ref 78.0–100.0)
MCV: 84.2 fL (ref 78.0–100.0)
Platelets: 126 10*3/uL — ABNORMAL LOW (ref 150–400)
Platelets: 133 10*3/uL — ABNORMAL LOW (ref 150–400)
Platelets: 237 10*3/uL (ref 150–400)
RBC: 2.98 MIL/uL — ABNORMAL LOW (ref 4.22–5.81)
RBC: 3.18 MIL/uL — ABNORMAL LOW (ref 4.22–5.81)
RBC: 3.25 MIL/uL — ABNORMAL LOW (ref 4.22–5.81)
RBC: 3.54 MIL/uL — ABNORMAL LOW (ref 4.22–5.81)
RDW: 18.8 % — ABNORMAL HIGH (ref 11.5–15.5)
RDW: 19.2 % — ABNORMAL HIGH (ref 11.5–15.5)
RDW: 19.5 % — ABNORMAL HIGH (ref 11.5–15.5)
WBC: 6.5 10*3/uL (ref 4.0–10.5)
WBC: 6.9 10*3/uL (ref 4.0–10.5)
WBC: 7.5 10*3/uL (ref 4.0–10.5)
WBC: 8.5 10*3/uL (ref 4.0–10.5)
WBC: 9.5 10*3/uL (ref 4.0–10.5)

## 2010-06-12 LAB — BASIC METABOLIC PANEL
BUN: 10 mg/dL (ref 6–23)
BUN: 10 mg/dL (ref 6–23)
BUN: 16 mg/dL (ref 6–23)
CO2: 26 mEq/L (ref 19–32)
CO2: 29 mEq/L (ref 19–32)
CO2: 30 mEq/L (ref 19–32)
Calcium: 7.9 mg/dL — ABNORMAL LOW (ref 8.4–10.5)
Calcium: 7.9 mg/dL — ABNORMAL LOW (ref 8.4–10.5)
Calcium: 8.6 mg/dL (ref 8.4–10.5)
Chloride: 100 mEq/L (ref 96–112)
Chloride: 107 mEq/L (ref 96–112)
Chloride: 97 mEq/L (ref 96–112)
Creatinine, Ser: 0.79 mg/dL (ref 0.4–1.5)
Creatinine, Ser: 0.84 mg/dL (ref 0.4–1.5)
Creatinine, Ser: 0.84 mg/dL (ref 0.4–1.5)
GFR calc Af Amer: >60 mL/min (ref >60)
GFR calc Af Amer: >60 mL/min (ref >60)
GFR calc non Af Amer: >60 mL/min (ref >60)
GFR calc non Af Amer: >60 mL/min (ref >60)
Glucose, Bld: 110 mg/dL — ABNORMAL HIGH (ref 70–99)
Glucose, Bld: 117 mg/dL — ABNORMAL HIGH (ref 70–99)
Glucose, Bld: 142 mg/dL — ABNORMAL HIGH (ref 70–99)
Potassium: 4.1 mEq/L (ref 3.5–5.1)
Potassium: 4.3 mEq/L (ref 3.5–5.1)
Sodium: 137 mEq/L (ref 135–145)
Sodium: 137 mEq/L (ref 135–145)
Sodium: 137 mEq/L (ref 135–145)

## 2010-06-12 LAB — POCT I-STAT GLUCOSE
Glucose, Bld: 85 mg/dL (ref 70–99)
Operator id: 3342

## 2010-06-12 LAB — POCT I-STAT, CHEM 8
BUN: 16 mg/dL (ref 6–23)
Chloride: 101 mEq/L (ref 96–112)
Chloride: 102 mEq/L (ref 96–112)
Creatinine, Ser: 0.9 mg/dL (ref 0.4–1.5)
Creatinine, Ser: 1 mg/dL (ref 0.4–1.5)
HCT: 25 % — ABNORMAL LOW (ref 39.0–52.0)
HCT: 27 % — ABNORMAL LOW (ref 39.0–52.0)
HCT: 29 % — ABNORMAL LOW (ref 39.0–52.0)
Hemoglobin: 9.2 g/dL — ABNORMAL LOW (ref 13.0–17.0)
Hemoglobin: 9.9 g/dL — ABNORMAL LOW (ref 13.0–17.0)
Potassium: 3.8 mEq/L (ref 3.5–5.1)
Potassium: 4.2 mEq/L (ref 3.5–5.1)
Potassium: 4.4 mEq/L (ref 3.5–5.1)
Sodium: 139 mEq/L (ref 135–145)
Sodium: 140 mEq/L (ref 135–145)
Sodium: 140 mEq/L (ref 135–145)
TCO2: 25 mmol/L (ref 0–100)

## 2010-06-12 LAB — POCT I-STAT 3, VENOUS BLOOD GAS (G3P V)
O2 Saturation: 85 %
TCO2: 29 mmol/L (ref 0–100)
pH, Ven: 7.26 (ref 7.250–7.300)

## 2010-06-12 LAB — POCT I-STAT 3, ART BLOOD GAS (G3+)
Acid-Base Excess: 1 mmol/L (ref 0.0–2.0)
Acid-Base Excess: 2 mmol/L (ref 0.0–2.0)
Acid-Base Excess: 3 mmol/L — ABNORMAL HIGH (ref 0.0–2.0)
O2 Saturation: 100 %
O2 Saturation: 90 %
TCO2: 30 mmol/L (ref 0–100)
pCO2 arterial: 47 mmHg — ABNORMAL HIGH (ref 35.0–45.0)
pCO2 arterial: 49.7 mmHg — ABNORMAL HIGH (ref 35.0–45.0)
pCO2 arterial: 53.6 mmHg — ABNORMAL HIGH (ref 35.0–45.0)
pH, Arterial: 7.352 (ref 7.350–7.450)
pO2, Arterial: 132 mmHg — ABNORMAL HIGH (ref 80.0–100.0)
pO2, Arterial: 416 mmHg — ABNORMAL HIGH (ref 80.0–100.0)
pO2, Arterial: 63 mmHg — ABNORMAL LOW (ref 80.0–100.0)

## 2010-06-12 LAB — PROTIME-INR
INR: 1.32 (ref 0.00–1.49)
INR: 1.46 (ref 0.00–1.49)
INR: 2.09 — ABNORMAL HIGH (ref 0.00–1.49)
INR: 2.28 — ABNORMAL HIGH (ref 0.00–1.49)
INR: 2.33 — ABNORMAL HIGH (ref 0.00–1.49)
INR: 2.37 — ABNORMAL HIGH (ref 0.00–1.49)
Prothrombin Time: 13.9 seconds (ref 11.6–15.2)
Prothrombin Time: 16.3 seconds — ABNORMAL HIGH (ref 11.6–15.2)
Prothrombin Time: 22.5 seconds — ABNORMAL HIGH (ref 11.6–15.2)
Prothrombin Time: 23.3 seconds — ABNORMAL HIGH (ref 11.6–15.2)
Prothrombin Time: 24.9 seconds — ABNORMAL HIGH (ref 11.6–15.2)
Prothrombin Time: 25.4 seconds — ABNORMAL HIGH (ref 11.6–15.2)

## 2010-06-12 LAB — GLUCOSE, CAPILLARY
Glucose-Capillary: 101 mg/dL — ABNORMAL HIGH (ref 70–99)
Glucose-Capillary: 105 mg/dL — ABNORMAL HIGH (ref 70–99)
Glucose-Capillary: 105 mg/dL — ABNORMAL HIGH (ref 70–99)
Glucose-Capillary: 113 mg/dL — ABNORMAL HIGH (ref 70–99)
Glucose-Capillary: 113 mg/dL — ABNORMAL HIGH (ref 70–99)
Glucose-Capillary: 129 mg/dL — ABNORMAL HIGH (ref 70–99)
Glucose-Capillary: 132 mg/dL — ABNORMAL HIGH (ref 70–99)
Glucose-Capillary: 148 mg/dL — ABNORMAL HIGH (ref 70–99)
Glucose-Capillary: 166 mg/dL — ABNORMAL HIGH (ref 70–99)
Glucose-Capillary: 188 mg/dL — ABNORMAL HIGH (ref 70–99)
Glucose-Capillary: 64 mg/dL — ABNORMAL LOW (ref 70–99)

## 2010-06-12 LAB — HEMOCCULT GUIAC POC 1CARD (OFFICE): Fecal Occult Bld: NEGATIVE

## 2010-06-12 LAB — CREATININE, SERUM
Creatinine, Ser: 0.84 mg/dL (ref 0.4–1.5)
Creatinine, Ser: 0.87 mg/dL (ref 0.4–1.5)
GFR calc Af Amer: >60 mL/min (ref >60)
GFR calc non Af Amer: >60 mL/min (ref >60)
GFR calc non Af Amer: >60 mL/min (ref >60)

## 2010-06-12 LAB — MAGNESIUM
Magnesium: 2.4 mg/dL (ref 1.5–2.5)
Magnesium: 2.9 mg/dL — ABNORMAL HIGH (ref 1.5–2.5)

## 2010-06-12 LAB — HEMOGLOBIN AND HEMATOCRIT, BLOOD
HCT: 23.9 % — ABNORMAL LOW (ref 39.0–52.0)
Hemoglobin: 7.8 g/dL — ABNORMAL LOW (ref 13.0–17.0)

## 2010-06-13 LAB — CBC
HCT: 34.6 % — ABNORMAL LOW (ref 39.0–52.0)
Hemoglobin: 11.3 g/dL — ABNORMAL LOW (ref 13.0–17.0)
Platelets: 218 10*3/uL (ref 150–400)
RDW: 19.2 % — ABNORMAL HIGH (ref 11.5–15.5)

## 2010-06-13 LAB — TYPE AND SCREEN: ABO/RH(D): A POS

## 2010-06-13 LAB — URINALYSIS, ROUTINE W REFLEX MICROSCOPIC
Hgb urine dipstick: NEGATIVE
Nitrite: NEGATIVE
Protein, ur: NEGATIVE mg/dL
Urobilinogen, UA: 1 mg/dL (ref 0.0–1.0)

## 2010-06-13 LAB — SURGICAL PCR SCREEN
MRSA, PCR: NEGATIVE
Staphylococcus aureus: NEGATIVE

## 2010-06-13 LAB — BLOOD GAS, ARTERIAL
Bicarbonate: 25.9 mEq/L — ABNORMAL HIGH (ref 20.0–24.0)
FIO2: 0.21 %
O2 Saturation: 97.1 %
Patient temperature: 98.6

## 2010-06-13 LAB — COMPREHENSIVE METABOLIC PANEL
ALT: 17 U/L (ref 0–53)
Albumin: 3.6 g/dL (ref 3.5–5.2)
Alkaline Phosphatase: 55 U/L (ref 39–117)
Chloride: 102 mEq/L (ref 96–112)
Potassium: 4.1 mEq/L (ref 3.5–5.1)
Sodium: 135 mEq/L (ref 135–145)
Total Bilirubin: 0.6 mg/dL (ref 0.3–1.2)
Total Protein: 6.1 g/dL (ref 6.0–8.3)

## 2010-06-13 LAB — POCT CARDIAC MARKERS
CKMB, poc: <1 ng/mL — ABNORMAL LOW (ref 1.0–8.0)
Myoglobin, poc: 55 ng/mL (ref 12–200)
Troponin i, poc: <0.05 ng/mL (ref 0.00–0.09)

## 2010-06-13 LAB — APTT: aPTT: 36 seconds (ref 24–37)

## 2010-06-18 ENCOUNTER — Encounter: Payer: Self-pay | Admitting: Internal Medicine

## 2010-06-18 ENCOUNTER — Ambulatory Visit (INDEPENDENT_AMBULATORY_CARE_PROVIDER_SITE_OTHER): Payer: Medicare Other | Admitting: Internal Medicine

## 2010-06-18 VITALS — BP 120/76 | Temp 98.0°F | Ht 64.5 in | Wt 170.0 lb

## 2010-06-18 DIAGNOSIS — N529 Male erectile dysfunction, unspecified: Secondary | ICD-10-CM

## 2010-06-18 DIAGNOSIS — I2510 Atherosclerotic heart disease of native coronary artery without angina pectoris: Secondary | ICD-10-CM

## 2010-06-18 DIAGNOSIS — D519 Vitamin B12 deficiency anemia, unspecified: Secondary | ICD-10-CM

## 2010-06-18 DIAGNOSIS — D518 Other vitamin B12 deficiency anemias: Secondary | ICD-10-CM

## 2010-06-18 DIAGNOSIS — J449 Chronic obstructive pulmonary disease, unspecified: Secondary | ICD-10-CM

## 2010-06-18 DIAGNOSIS — I10 Essential (primary) hypertension: Secondary | ICD-10-CM

## 2010-06-18 MED ORDER — SILDENAFIL CITRATE 50 MG PO TABS: 50.0000 mg | ORAL_TABLET | ORAL | Status: DC | PRN

## 2010-06-18 MED ORDER — CYANOCOBALAMIN 1000 MCG/ML IJ SOLN
1000.0000 ug | Freq: Once | INTRAMUSCULAR | Status: AC
  Administered 2010-06-18: 1000 ug via INTRAMUSCULAR

## 2010-06-18 NOTE — Assessment & Plan Note (Signed)
Adequate control Continue same meds 

## 2010-06-18 NOTE — Assessment & Plan Note (Signed)
Ok to try viagra Side effects discussed---- He understands interaction with nitrates

## 2010-06-18 NOTE — Assessment & Plan Note (Signed)
No sxs Continue same meds 

## 2010-06-18 NOTE — Progress Notes (Signed)
  Subjective:    Patient ID: [name], male    DOB: 09-Sep-1940, 70 y.o.   MRN: [id number]  HPI F/u htn---tolerating meds well  CAD---no sxs  COPD---he still has some SOB and "chest congestion" reviewed Dr. Thurston Hole note  He has noted a "bump" at the distal end of midsternal scar (related to CABG).   He is interested in taking viagra---no use of nitrates  Past Medical History  Diagnosis Date  . HYPERLIPIDEMIA 11/21/2006  . HYPERTENSION 11/21/2006  . MYOCARDIAL INFARCTION, HX OF 11/21/2006  . CORONARY ARTERY DISEASE 11/21/2006  . OSTEOPOROSIS 11/21/2006  . COLONIC POLYPS 05/21/2007  . COPD 05/21/2007  . HIATAL HERNIA 05/21/2007  . DIVERTICULAR DISEASE 05/21/2007  . NEPHROLITHIASIS 05/21/2007  . GI BLEEDING 09/09/2008  . ANEMIA, B12 DEFICIENCY 12/02/2008  . CAROTID ARTERY STENOSIS 04/16/2009  . PERIPHERAL VASCULAR DISEASE 09/02/2009  . Atrial fibrillation 11/06/2009   Past Surgical History  Procedure Date  . Thyroidectomy   . Coronary stent placement     03/07/2001  . Cholecystectomy   . Appendectomy   . Hemorrhoid surgery   . Inguinal hernia repair 10/10/08  . Coronary artery bypass graft   . Ptca     reports that he quit smoking about 8 months ago. His smoking use included Cigarettes. He does not have any smokeless tobacco history on file. He reports that he drinks alcohol. He reports that he does not use illicit drugs. family history includes Arthritis in his mother; Heart attack in his father; and Heart disease in his brothers and father.  There is no history of Colon cancer. Allergies  Allergen Reactions  . Ticlopidine Hcl     REACTION: swelling      Review of Systems  patient denies chest pain, shortness of breath (more than baseline), orthopnea. Denies lower extremity edema, abdominal pain, change in appetite, change in bowel movements. Patient denies rashes, musculoskeletal complaints. No other specific complaints in a complete review of systems.      Objective:   Physical Exam  well-developed well-nourished male in no acute distress. HEENT exam atraumatic, normocephalic, neck supple without jugular venous distention. Chest clear to auscultation (but decreased breath sounds throughout).  cardiac exam S1-S2 are regular. Abdominal exam overweight with bowel sounds, soft and nontender. Extremities no edema. Neurologic exam is alert with a normal gait. He has a small hernia at distal end of midsternal scar....only present with valsalva.        Assessment & Plan:

## 2010-06-18 NOTE — Assessment & Plan Note (Signed)
Has persistent lower and upper airway sxs---addressed by pulmonary  for now stay on same inhaler regimen

## 2010-07-05 LAB — TYPE AND SCREEN: ABO/RH(D): A POS

## 2010-07-05 LAB — POCT CARDIAC MARKERS
CKMB, poc: <1 ng/mL — ABNORMAL LOW (ref 1.0–8.0)
Myoglobin, poc: 61.6 ng/mL (ref 12–200)
Troponin i, poc: <0.05 ng/mL (ref 0.00–0.09)

## 2010-07-05 LAB — CBC
MCHC: 32.9 g/dL (ref 30.0–36.0)
Platelets: 213 10*3/uL (ref 150–400)
RDW: 16.1 % — ABNORMAL HIGH (ref 11.5–15.5)
RDW: 16.9 % — ABNORMAL HIGH (ref 11.5–15.5)

## 2010-07-05 LAB — BASIC METABOLIC PANEL
BUN: 21 mg/dL (ref 6–23)
CO2: 27 mEq/L (ref 19–32)
Calcium: 8.4 mg/dL (ref 8.4–10.5)
Calcium: 9 mg/dL (ref 8.4–10.5)
Creatinine, Ser: 0.69 mg/dL (ref 0.4–1.5)
GFR calc Af Amer: >60 mL/min (ref >60)
GFR calc non Af Amer: >60 mL/min (ref >60)
Glucose, Bld: 112 mg/dL — ABNORMAL HIGH (ref 70–99)
Glucose, Bld: 125 mg/dL — ABNORMAL HIGH (ref 70–99)

## 2010-07-05 LAB — PROTIME-INR
INR: 1.1 (ref 0.00–1.49)
Prothrombin Time: 13.9 seconds (ref 11.6–15.2)
Prothrombin Time: 14.2 seconds (ref 11.6–15.2)

## 2010-07-05 LAB — DIFFERENTIAL
Basophils Absolute: 0 10*3/uL (ref 0.0–0.1)
Eosinophils Relative: 3 % (ref 0–5)
Lymphocytes Relative: 19 % (ref 12–46)
Neutro Abs: 3.8 10*3/uL (ref 1.7–7.7)

## 2010-07-05 LAB — HEMOGLOBIN AND HEMATOCRIT, BLOOD
HCT: 30.1 % — ABNORMAL LOW (ref 39.0–52.0)
HCT: 32.2 % — ABNORMAL LOW (ref 39.0–52.0)
Hemoglobin: 10 g/dL — ABNORMAL LOW (ref 13.0–17.0)

## 2010-07-19 ENCOUNTER — Ambulatory Visit (INDEPENDENT_AMBULATORY_CARE_PROVIDER_SITE_OTHER): Payer: Medicare Other | Admitting: Internal Medicine

## 2010-07-19 DIAGNOSIS — D518 Other vitamin B12 deficiency anemias: Secondary | ICD-10-CM

## 2010-07-19 MED ORDER — CYANOCOBALAMIN 1000 MCG/ML IJ SOLN
1000.0000 ug | Freq: Once | INTRAMUSCULAR | Status: AC
  Administered 2010-07-19: 1000 ug via INTRAMUSCULAR

## 2010-07-26 ENCOUNTER — Ambulatory Visit (INDEPENDENT_AMBULATORY_CARE_PROVIDER_SITE_OTHER)
Admission: RE | Admit: 2010-07-26 | Discharge: 2010-07-26 | Disposition: A | Discharge status: Home / Self Care | Payer: Medicare Other | Source: Ambulatory Visit | Attending: Internal Medicine | Admitting: Internal Medicine

## 2010-07-26 ENCOUNTER — Ambulatory Visit (INDEPENDENT_AMBULATORY_CARE_PROVIDER_SITE_OTHER): Payer: Medicare Other | Admitting: Internal Medicine

## 2010-07-26 ENCOUNTER — Encounter: Payer: Self-pay | Admitting: Internal Medicine

## 2010-07-26 DIAGNOSIS — I2510 Atherosclerotic heart disease of native coronary artery without angina pectoris: Secondary | ICD-10-CM

## 2010-07-26 DIAGNOSIS — R079 Chest pain, unspecified: Secondary | ICD-10-CM

## 2010-07-26 NOTE — Progress Notes (Signed)
  Subjective:    Patient ID: [name], male    DOB: [birth year], 70 y.o.   MRN: [id number]  HPI  After sneezing last week he developed posterior right rib pain and midsternal pain. No exertional pain. Pain is exacerbated by coughing/sneezing. Patient has dyspnea---seems to be gradually worsening. Has f/u appt with dr wert in two days. Pt denies any fever, chills, cough. No wheeze.  Pt with COPD--stopped dulera (dry mouth)- has f/u with dr. Sherene Sires  Pt with CAD---no similar anginal pains  Osteoporosis---using forteo  has a current medication list which includes the following prescription(s): aspirin, caltrate 600+d plus, crestor, dextromethorphan-guaifenesin, furosemide, levalbuterol, losartan, methylprednisolone, nebivolol hcl, pantoprazole, sildenafil, teriparatide (recombinant), tiotropium, triamcinolone, vitamin b-12, and mometasone furo-formoterol fum.    Review of Systems  patient denies orthopnea. Denies lower extremity edema, abdominal pain, change in appetite, change in bowel movements. Patient denies rashes, musculoskeletal complaints. No other specific complaints in a complete review of systems.      Objective:   Physical Exam elderly male in no acute distress. HEENT exam atraumatic, normocephalic, neck supple without jugular venous distention. Chest clear to auscultation cardiac exam S1-S2 are regular. Sternum and right chest wall are tender to palpation. Abdominal exam overweight with bowel sounds, soft and nontender. Extremities no edema. Neurologic exam is alert with a normal gait.      Assessment & Plan:

## 2010-07-26 NOTE — Assessment & Plan Note (Signed)
i doubt his sxs are related to angina On exam he is tender to palpation i'll send for right rib film and sternal film

## 2010-07-27 ENCOUNTER — Encounter: Payer: Self-pay | Admitting: *Deleted

## 2010-07-28 ENCOUNTER — Encounter: Payer: Self-pay | Admitting: Internal Medicine

## 2010-07-29 ENCOUNTER — Encounter: Payer: Self-pay | Admitting: Internal Medicine

## 2010-07-29 ENCOUNTER — Ambulatory Visit (INDEPENDENT_AMBULATORY_CARE_PROVIDER_SITE_OTHER): Payer: Medicare Other | Admitting: Internal Medicine

## 2010-07-29 DIAGNOSIS — J449 Chronic obstructive pulmonary disease, unspecified: Secondary | ICD-10-CM

## 2010-07-29 DIAGNOSIS — J4489 Other specified chronic obstructive pulmonary disease: Secondary | ICD-10-CM

## 2010-07-29 NOTE — Patient Instructions (Signed)
Symbicort 80 Take 2 puffs first thing in am and then another 2 puffs about 12 hours later.    Spiriva each am (don't wait to take it)  Add zyrtec as needed for sneezing or drainage  See calendar for specific medication instructions and bring it back for each and every office visit for every healthcare provider you see.  Without it,  you may not receive the best quality medical care that we feel you deserve.  You will note that the calendar groups together  your maintenance  medications that are timed at particular times of the day.  Think of this as your checklist for what your doctor has instructed you to do until your next evaluation to see what benefit  there is  to staying on a consistent group of medications intended to keep you well.  The other group at the bottom is entirely up to you to use as you see fit  for specific symptoms that may arise between visits that require you to treat them on an as needed basis.  Think of this as your action plan or "what if" list.   Separating the top medications from the bottom group is fundamental to providing you adequate care going forward.    See Tammy 2 weeks to verify the meds and the calendar (bring all meds with you !!)

## 2010-07-29 NOTE — Assessment & Plan Note (Signed)
DDX of  difficult airways managment all start with A and  include Adherence, Ace Inhibitors, Acid Reflux, Active Sinus Disease, Alpha 1 Antitripsin deficiency, Anxiety masquerading as Airways dz,  ABPA,  allergy(esp in young), Aspiration (esp in elderly), Adverse effects of DPI,  Active smokers, plus two Bs  = Bronchiectasis and Beta blocker use..and one C= CHF   Adherence is always the initial "prime suspect" and is a multilayered concern that requires a "trust but verify" approach in every patient - starting with knowing how to use medications, especially inhalers, correctly, keeping up with refills and understanding the fundamental difference between maintenance and prns vs those medications only taken for a very short course and then stopped and not refilled.  Despite the efforts of my NP and myself he is consistently inconsistent following instructions and maintaining med reconciliation long enough to tell if med changes are doing any good here  The proper method of use, as well as anticipated side effects, of this metered-dose inhaler are discussed and demonstrated to the patient. This improved to 75% for hfa and 95% with DPI  See instructions for specific recommendations which were reviewed directly with the patient who was given a copy with highlighter outlining the key components.

## 2010-07-29 NOTE — Progress Notes (Signed)
Subjective:    Patient ID: [name], male    DOB: [birth year], 70 y.o.   MRN: [id number]  HPI   14 yowm quit smoking July 2011 at CABG and did fine in terms of improved ex tol > [place]   February 16, 2010 1st pulmonary office eval   cc sob x 3 weeks first noticed walking up incline then cough onset x 2 weeks with flu like symptoms with white mucus mucus then turned bloody x 24 hours with maybe 2 tbs of blood, no purulent or putrid sputum, with cxr 11/21 at [facility] suggesting pna in lingula, new since 11/3 so started on amox and referred to pulmonary clinic. has mild sore throat, no choking or obvious asp, had dental work done about 2 weeks before onset of symptoms.  He has had intermittent Left sided CP x years post on R intermittent related to rib fx from coughing. No recent change. rec ct neg for pulmonary emboli or ca. Stop altace  Stop aspirin until no bleeding for 3 days  Stop amoxiicillin and taek augmentin 875 with bfeast and supper and yogurt for lunch  Please schedule a follow-up appointment in 10 days, sooner if needed with cxr on return  Add pepcid 20 mg one at bedtime  GERD (REFLUX) diet  Late Add: pft's c/w copd /ab and cxr's inconclusiive, so CTangiogram done and ok  rx  symbicort 160 2 puffs first thing in am and 2 puffs again in pm about 12 hours   February 25, 2010 ov cough better, hasn't tried much outdoor activity yet, no more hemoptysis. >>d/c ace/norvasc, rx bystolic. tx w/ PPI/pepcid , xray showed Persistent patchy and nodular airspace disease in the left lung.  March 11, 2010--Presents for follow up and med review. He has brought all his meds in today. He is taking symbicort only once daily (not two times a day ) and has not started on pepcid at bedtime. Last ov changed off ace /norvasc now on bystolic. Cough is better w/ no further hemoptysis but not totally gone. Xray last ov showed persistent patchy and nodular airspace disease in the  left lung. I will call with chest xray results.  Follow med calenadar closley and bring to each visit.  add pepcid 20mg  at bedtime  Increase Symbicort 2 puffs two times a day    April 23, 2010 ov cough and sob no better and throat very sore, no better p rx with rx as uri with doxy. mild dysphagia, mod hoarseness rec .stop symbicort  Try xopenex in place of ventolin  Dulera 100 2 bid Work on inhaler technique  See calendar for specific medication instructions > lost it   07/29/2010 ov/Thorin Starner cc doe not better, no where near baseline  not able to tolerate dulera due to mouth irritation, did not try spacer as rec or arm and hammer toothpaste as rec. No purulent sputum. Sleeping ok without nocturnal  or early am exac of resp c/o's or need for noct saba.   Pt denies any significant sore throat, dysphagia, itching, sneezing,  nasal congestion or excess/ purulent secretions,  fever, chills, sweats, unintended wt loss, pleuritic or exertional cp, hempoptysis, orthopnea pnd or leg swelling.    Also denies any obvious fluctuation of symptoms with weather or environmental changes or other aggravating or alleviating factors.  On day of ov had not yet used spiriva, does feel xopenex works when remembers to use it.   Past Medical History:  Allergic rhinitis  Osteoporosis  1. Coronary artery disease status post multiple percutaneous coronary  inventions > CABG 09/2009  2. Peripheral vascular status post previous stenting of the totally  occluded iliac and status post stenting of an external iliac in  January 2009 with recurrent symptoms of questionable claudication.  3. Hypertension.  4. Hyperlipidemia.  5. History of amaurosis fugax 1998.  6. Gastroesophageal reflux disease.  anemia, B12 deficiency  AFIB--post CABG  Atrial fibrillation  COPD  - PFT's 01/28/10 FEV1 1.16 46% ratio 44 with 27% improvement p B2, DLCO 76%  - HFA 50% p coaching February 25, 2010 > 75% April 23, 2010 > 75% 07/29/2010    Hemoptysis onset 01/2010  - CT chest 02/19/10 : no PE, no ca/ nodular infiltrates > f/u 03/11/10 cxr no nodules  COMPLEX MED REGIMEN Meds reviewed with pt education and computerized med calendar completed March 11, 2010           Review of Systems     Objective:   Physical Exam      wt 167 February 16, 2010 > 172 April 24, 2010 > 173 May 22, 2010 > 165 07/29/2010  HEENT: nl dentition, turbinates, Nl external ear canals without cough reflex. Nonspecific erythema of oropharanx, no thrush  NECK : without JVD/Nodes/TM/ nl carotid upstrokes bilaterally  LUNGS: no acc muscle use, clear to A and P bilaterally without cough on insp or exp maneuvers, mod increase Texp  CV: RRR no s3 or murmur, no increase in P2, no edema  ABD: soft and nontender with nl excursion in the supine position. No bruits or organomegaly, bowel sounds nl  MS: warm without deformities, calf tenderness, cyanosis or clubbing    Assessment & Plan:

## 2010-08-10 ENCOUNTER — Encounter: Payer: Self-pay | Admitting: Adult Health

## 2010-08-10 NOTE — Discharge Summary (Signed)
[name], [name] NO.:  [id number]   MEDICAL RECORD NO.:  [id number]          PATIENT TYPE:  INP   LOCATION:  5127                         FACILITY:  [facility]   PHYSICIAN:  Gordy Savers, MDDATE OF BIRTH:  Sep 15, 1940   DATE OF ADMISSION:  09/06/2008  DATE OF DISCHARGE:  09/07/2008                               DISCHARGE SUMMARY   FINAL DIAGNOSIS:  Lower gastrointestinal bleeding, status post  polypectomy.   ADDITIONAL DIAGNOSES:  1. Coronary artery disease.  2. Chronic obstructive pulmonary disease.  3. Colonic polyps.  4. Atherosclerotic peripheral vascular disease.  5. Bilateral hernias.  6. Blood loss anemia.   HISTORY OF PRESENT ILLNESS:  The patient is a 70 year old gentleman who  underwent colonoscopy as well as upper gastroduodenoscopy on September 04, 2008.  At that time, he had multiple polypectomies.  On the day prior to  admission, he began having frequent bloody stools, these were described  as acute hematochezia, dark red with clotting, and large volume.  Prior  to his arrival, he had 7-9 episodes over the preceding 8 hours.  The  patient was subsequently admitted for further observation and evaluation  of his lower gastrointestinal tract bleeding.  The patient also  describes some lower abdominal discomfort that he attributed more to his  bilateral inguinal hernias.   PAST MEDICAL HISTORY:  Pertinent for coronary artery disease as well as  peripheral vascular disease.  He has had multiple PCIs with stenting as  well as 2 stents involving his left common iliac artery and left  external iliac artery.  He is also contemplating the surgery for  bilateral symptomatic inguinal hernias.  The patient was admitted to the  hospital where he was supported with IV fluids.  His aspirin and Plavix  were held during the hospital.  Serial blood pressures were monitored  and remained hemodynamically stable.  H&H were followed every 8 hours  and reached a  level of the hemoglobin 9.6, hematocrit 29.3.  Following  his admission, he had little lower GI bleeding.  At the time of his  discharge, he had had no rectal bleeding for 18 hours.  His final H&H  was 9.6 and 29.3%.  During the hospital period, he tolerated a liquid  diet without difficulty.  He was seen in consultation by the  Gastroenterology Service.  Cardiology was consulted and they felt it is  safe to hold the aspirin and Plavix until rectal bleeding stops.  He has  had no recent interventions or stenting.   DISPOSITION:  The patient will be discharged today on his preadmission  regimen with the exception of Plavix.  This will be held until the  patient is seen in followup and will be considered for resuming if the  patient has no further bleeding.  He will be resumed on aspirin 81 mg  daily.  He will be seen in followup in 2 days and repeat H&H will be  checked at that time.  The patient has been asked to notify us  immediately if there is any further large volume  rectal bleeding.   His discharge medications include the following,  1. Plavix 75 mg daily, presently on hold.  2. Norvasc 10 mg daily, presently on hold.  3. Aspirin 81 mg daily.  4. Iron sulfate 325 mg b.i.d.  5. Altace 10 mg daily.  6. Boniva 150 mg monthly.  7. Caltrate plus D 1 b.i.d.  8. Crestor 10 mg daily.  9. Protonix 40 mg b.i.d.  10.Spiriva inhaler daily use.  11.Tenormin 100 mg daily.  12.Ventolin 2 puffs every 6 hours as needed for shortness of breath.  13.Vitamin B12 injections 1 mL monthly.   CONDITION ON DISCHARGE:  Stable.      Gordy Savers, MD  Electronically Signed     PFK/MEDQ  D:  09/07/2008  T:  09/07/2008  Job:  [id number]

## 2010-08-10 NOTE — Consult Note (Signed)
NAMEBRAVLIO, LUCA NO.:  [id number]   MEDICAL RECORD NO.:  [id number]          PATIENT TYPE:  EMS   LOCATION:  MAJO                         FACILITY:  [facility]   PHYSICIAN:  Thomas C. Wall, MD, FACCDATE OF BIRTH:  04-Jun-1940   DATE OF CONSULTATION:  11/04/2007  DATE OF DISCHARGE:                                 CONSULTATION   PRIMARY CARDIOLOGIST:  Everardo Beals. Juanda Chance, MD, [facility]   PRIMARY CARE PHYSICIAN:  Valetta Mole. Swords, MD   [name] is a very pleasant 70 year old Caucasian gentleman with known  history of coronary artery disease.  I do not have all of his cath  reports available, but he informs me that he has 6 cardiac stents and 2  iliac stents.  His last cardiac catheterization was done in 2006, at  which time everything was stable.  No interventions were done.  Most  recent catheterization done in regards to his peripheral arterial  disease was in January 2009, at which time he had a stent to the left  external iliac.  Mr. Granberg states he has been compliant with his  medications and has done quite well.  He has had several  hospitalizations for chest pain that were deemed noncardiac, most  recently in February 2009.  Note, he does have diffuse nonobstructive  disease also and is an ongoing tobacco user.  Today, Mr. Crum was  sitting in church.  He states he had some slight indigestion, took a  Rolaids with complete relief, and then suddenly had a sharp stabbing  pain.  He states it was the worse thing he had ever felt, he rated it at  10, and it was very brief.  He states it was right over his heart.  He  to 1 nitroglycerin and the pain had already started to ease of.  He did  state he became slightly short of breath because the pain was so intense  and he broke down in a sweat.  Apparently, family members were in church  with him and noticed he took the nitroglycerin.  A family member is a  Engineer, structural.  They became concerned and insisted that the patient be  evaluated.  He was brought here to [facility] Emergency Room for further  evaluation.  He has been pain-free, only took that 1 nitroglycerin.  EKG  showed no acute abnormalities.  First point-of-care set is negative.  Mr. Brisendine states he feels back to his baseline.  He states if he had been  at home and took his nitroglycerin, no one would have known and he would  not have to come to the emergency room, and he is wishing to be  discharged back home at this time.  Note, he did have a stress test in  our office in October 2008 that was okay per patient's report and  documented as this in a previous note by Dr. Antoine Poche.   PAST MEDICAL HISTORY:  1. Coronary artery disease, multiple PCI stents, and diffuse      nonobstructive disease, last cath in 2006, at which  time stents      were stable.  2. Hypertension.  3. History of lower GI bleed with colon polyps and diverticulosis.  4. History of GERD.  5. Dyslipidemia.  6. Partial thyroidectomy.  7. Peripheral arterial disease.  He has got stents to the left common      iliac and the left external iliac in 2008 and 2009.  8. Also, has a history of noncardiac chest pain and amaurosis fugax.  9. Also, status post appendectomy and cholecystectomy in the setting      of ongoing tobacco use.   SOCIAL HISTORY:  The patient was recently widowed in February 2009.  He  lives in [place].  His brother-in-law lives with him.  He is retired  from [employer].  He has adult children.  Denies any EtOH, drug,  or herbal medication use.  Positive for ongoing tobacco use.   FAMILY HISTORY:  Mother deceased at age 24 with a history of congestive  heart failure.  Father deceased at age 47 secondary to acute MI.  Positive for CAD and heart failure in siblings.   REVIEW OF SYSTEMS:  Positive for sweats, chest pain, shortness of  breath, chronic dyspnea on exertion, and GERD symptoms.  All systems  reviewed and negative.   ALLERGIES:  TICLID.    CURRENT MEDICATIONS:  1. Altace 10.  2. Aspirin 81.  3. Calcium.  4. Crestor 10.  5. Norvasc 10.  6. Plavix 75.  7. Protonix 40.  8. Tenormin 100.   PHYSICAL EXAMINATION:  VITAL SIGNS:  Temperature is 97, heart rate 61,  respirations 18, and blood pressure 123/62.  The patient is in no acute  distress, currently pain-free.  HEENT:  Unremarkable.  NECK:  Supple without lymphadenopathy, bruits, or JVD.  CARDIOVASCULAR:  S1 and S2.  Regular rate and rhythm.  The patient does  have a 2/6 systolic ejection murmur noted.  SKIN:  Warm and dry.  ABDOMEN:  Soft and nontender.  Positive bowel sounds.  LOWER EXTREMITIES:  Without clubbing, cyanosis, or edema.  Positive  pedal pulses.  NEUROLOGIC:  Alert and oriented x3.   Chest x-ray showed no acute findings, borderline cardiomegaly, small  hiatal hernia.  EKG:  Sinus brady at a rate of 59 without acute ST or T-  wave changes.   LABORATORY DATA:  H and H 11 and 34, WBCs 4.5, and platelets 211,000.  Sodium 139, potassium 3.7, chloride 103, BUN 11, creatinine 0.9, and  glucose 92.  Point-of-care negative x1 set, and second set is pending at  this time.   IMPRESSION:  Chest pain, relieved with 1 nitroglycerin earlier today  while in church.  The patient with known coronary artery disease, status  post multiple interventions.  Also, with history of atypical chest pain,  last cardiac catheterization in 2006 with no interventions at that time.  The patient states the chest pain he had today was  different from his previous angina, and the patient wishes to return  home.  I have given him a new prescription for his nitroglycerin.  He  knows to call if there are any problems.  Dr. Valera Castle has been in to  examine and assess the patient and agrees with the plan of care.  The  patient will follow up with Dr. Juanda Chance as previously scheduled.      Dorian Pod, ACNP      Jesse Sans. Daleen Squibb, MD, [facility]  Electronically Signed    MB/MEDQ   D:  11/04/2007  T:  11/05/2007  Job:  [id number]   cc:   Valetta Mole. Swords, MD

## 2010-08-10 NOTE — Assessment & Plan Note (Signed)
[facility]                         GASTROENTEROLOGY OFFICE NOTE   NAME:[name], [name]                          MRN:          [id number]  DATE:01/30/2007                            DOB:          09-30-1940    [name] is a very nice 70 year old gentleman with severe coronary  artery disease, peripheral vascular disease. We have been seeing him  over the years initially for cholecystitis. He underwent cholecystectomy  in the 1990s. He also had colon polyps on his prior colonoscopy in 1999,  2002 and 2005. He is a smoker. He has hyperlipidemia, high blood  pressure and mild anemia. He also has a hiatal hernia with mild  stricture. Last endoscopy was 1993. Her reflux symptoms have been  controlled with Protonix 40 mg daily. He is here today because of  ongoing diarrhea. We saw him for diarrhea in 2005. His colonoscopy and  biopsies did not show any evidence of inflammatory bowel disease or  microscopic colitis. His diarrhea was attributed to a combination of  irritable bowel syndrome and post cholecystectomy choleretic diarrhea.  His diarrhea has now become worse. He sometimes get up at night to have  a bowel movement. His stools are soft and sometimes watery. Only rarely  does he have a formed stool. He has occasional blood when he wipes on  the toilet tissue. It was noted on colonoscopy that he did have internal  hemorrhoids. He denies any abdominal pain. He has had several accidents  in recent past.   CURRENT MEDICATIONS:  1. Calcium supplements.  2. Vitamin A.  3. Aspirin 81 mg p.o. daily.  4. Crestor 10 mg p.o. daily.  5. Protonix 40 mg p.o. daily.  6. Norvasc 10 mg p.o. daily.  7. Altace 10 mg p.o. daily.  8. Tenormin 50 mg two p.o. daily.  9. Plavix 75 mg p.o. daily.  10.Boniva 150 mg p.o. daily.   PHYSICAL EXAMINATION:  Blood pressure 118/66, pulse 64 and weight is 160  pounds. This is his stable weight. He was alert and oriented. He is a  smoker with stigmata of nicotine stains.  LUNGS:  Decreased breath sounds.  COR: Normal S1, normal S2.  ABDOMEN: Soft with some voluntary guarding. There was no tenderness.  Bowel sounds were normoactive. No abnormal tympany or abnormal bowel  sounds or rashes.  RECTAL: Was somewhat tender in the anal canal with normal rectal tone. A  small amount of hemoccult negative stool.   IMPRESSION:  A 70 year old white male with chronic diarrhea, likely  combination of choleretic diarrhea and irritable bowel syndrome. His  colon biopsies within the last three years did not show any evidence of  inflammatory bowel disease. Protonix possibly could give him diarrhea,  but he has been on it for a long time and prior to Protonix was on  Prilosec. Other possibilities would be something like flu. He has never  been checked for it and we will go ahead and check for tissue  transglutaminase level; C-difficile colitis would be less likely since  he has not been on antibiotics. Since there is  no abdominal pain,  intestinal angina is less likely. He has no significant diverticulosis  on his colonoscopies.   PLAN:  1. Stool for O&P culture, lactoferrin and C-difficile.  2. Bentyl 20 mg p.o. b.i.d. as an antispasmodic.  3. Questran 4 grams daily in a package form.  4. Check tissue transglutaminase levels.  5. Medications will be prescribed to [facility] in [place].     Hedwig Morton. Juanda Chance, MD  Electronically Signed    DMB/MedQ  DD: 01/30/2007  DT: 01/30/2007  Job #: [id number]   cc:   Valetta Mole. Swords, MD

## 2010-08-10 NOTE — Progress Notes (Signed)
[facility]                        PERIPHERAL VASCULAR OFFICE NOTE   NAME:[name], [name]                          MRN:          [id number]  DATE:05/24/2007                            DOB:          03-31-1940    [name] was seen in follow-up at the [facility] peripheral vascular  office on May 24, 2007.  He underwent left external iliac stenting  in January.  He had a new lesion in the external iliac after a previous  severe lesion was treated in the common iliac.  He had severe recurrent  claudication and has had a great response to stenting.  He has had no  further leg pain.  He is able to walk without limitation.  He is  actually limited by dyspnea at present which causes him to be  symptomatic with a fairly modest degree of walking.  He has had no hip,  thigh or calf pain.  He has no other complaints at this time.   Unfortunately, [name] wife passed away earlier this month  unexpectedly.  She had been ill with rheumatoid arthritis but she died  suddenly at their home.   MEDICATIONS:  1. Calcium 600 plus D.  2. Vitamin E 400 international units daily.  3. Aspirin 81 mg daily.  4. Crestor 10 mg daily.  5. Protonix 40 mg daily.  6. Norvasc 10 mg daily.  7. Altace 10 mg daily.  8. Tenormin 100 mg daily.  9. Plavix 75 mg daily.  10.Boniva 150 mg daily.  11.Dicyclomine.   ALLERGIES:  TICLID.   PHYSICAL EXAMINATION:  He is alert and oriented in no acute distress.  Weight 161, blood pressure 122/74, heart rate 68, respiratory rate 16.  HEENT:  Normal.  NECK:  Normal carotid upstrokes without bruits.  Jugular venous pressure  is normal.  LUNGS:  Clear bilaterally.  HEART:  Regular rate and rhythm without murmurs or gallops.  ABDOMEN:  Soft, obese, nontender.  EXTREMITIES:  Femoral pulses are 2+ without bruits. Pedal pulses are 2+  and equal bilaterally.  SKIN:  Warm and dry without rash.   ASSESSMENT:  This is a 70 year old gentleman  with peripheral arterial  disease and lower extremity claudication with resolution of symptoms  following left iliac stenting.  [name] is doing well with regard to  his PAD and is having no symptoms at present.  I would like to see him  back in 6 months with an ABI at that time.  He had nearly discontinued  cigarettes but with his wife's passing he has been smoking again.  I  encouraged him to do the best he can with stopping cigarettes.  Otherwise his medical therapy is appropriate.  I offered him some Ambien  to help him sleep at night because he is having a lot of difficulty in  this regard but he declined. He will contact Dr. Cato Mulligan if he has  continued problems.     [name]. Excell Seltzer, MD  Electronically Signed    MDC/MedQ  DD: 05/24/2007  DT: 05/25/2007  Job #: [id number]   cc:  Bruce Rexene Edison Cato Mulligan, MD  Everardo Beals. Juanda Chance, MD, [facility]

## 2010-08-10 NOTE — Assessment & Plan Note (Signed)
[facility]                            CARDIOLOGY OFFICE NOTE   NAME:[name]                          MRN:          [id number]  DATE:04/13/2007                            DOB:          [birth year]    PRIMARY CARE PHYSICIAN:  Valetta Mole. Swords, M.D.   CLINICAL HISTORY:  [name] is 70 years old and returned for followup  management of his coronary heart disease and peripheral vascular  disease.  He has had multiple percutaneous coronary interventions and,  at last catheterization, had nonobstructive disease.  Last October, he  underwent stenting of the left iliac vessel for total occlusion and  symptoms of left hip and leg claudication.  He had dramatic improvement  in his symptoms and his index on the left side went from 0.46 to 0.96.   He has been doing well from the standpoint of his heart with no angina,  shortness of breath or palpitations.  However, he says he has developed  recurrent pain in his left hip and leg when he walks, which he says is  identical to what he had prior to his stent and he is worried that his  stent may have re-occluded.   PAST MEDICAL HISTORY:  Significant for hypertension, hyperlipidemia and  GERD.   CURRENT MEDICATIONS INCLUDE:  Aspirin, Crestor, Protonix, Norvasc,  Altace, Tenormin, Plavix, Boniva and Dyclomine.   EXAMINATION:  On examination today, the blood pressure is 116/70 and the  pulse 67 and regular.  There was no venous distention.  The carotid pulses were full, without  bruits.  CHEST:  Clear.  HEART:  Rhythm was regular, no murmurs or gallops.  ABDOMEN:  Soft without organomegaly.  There was no peripheral edema.  On examination of his pulses, I had a  difficult time feeling pedal pulses in his left foot and a difficult  time feeling a left femoral pulse.   IMPRESSION:  1. Peripheral vascular disease with left-hip claudication.  Suspect      reocclusion of recently-placed stent.  2. Coronary  heart disease, status post multiple percutaneous coronary      interventions with nonobstructive disease at last catheterization.  3. Hypertension.  4. Hyperlipidemia.  5. History of amaurosis fugax.  6. GERD.   RECOMMENDATIONS:  Pulse, symptoms and physical examination suggest he  has occluded his stent in the left iliac vessel.  We will plan to get  Doppler studies, but I think he will need repeat angiography and I will  set this up for Dr. Excell Seltzer after discussing it with him.     Bruce Elvera Lennox Juanda Chance, MD, [facility]  Electronically Signed    BRB/MedQ  DD: 04/13/2007  DT: 04/13/2007  Job #: [id number]   cc:   Valetta Mole. Swords, MD

## 2010-08-10 NOTE — Progress Notes (Signed)
[facility]                        PERIPHERAL VASCULAR OFFICE NOTE   NAME:Sawka, KHAYMAN KIRSCH                          MRN:          [id number]  DATE:04/27/2007                            DOB:          1940/09/18    Elisandro Jarrett was seen as a walk-in in clinic today.  He was across the  street at the hospital with his son who is undergoing an  electrophysiology procedure by Dr. Graciela Husbands.  He mentioned that he is  having some groin pain with drainage after his recent peripheral  vascular procedure on January 20.  I brought him over to check his groin  in the office.   Mr. Boyte complains of some residual soreness in his groin area on both  sides.  He underwent bilateral femoral arterial access to undergo an  iliac stent procedure on January 20.  He has had no fevers, chills or  other complaints.  His left leg claudication has resolved.  He noticed  this morning that he had a small amount of drainage from the right groin  arteriotomy site and was concerned about this.  He has no other  complaints.   On exam there is mild tenderness of both femoral arterial access areas.  There is no bruit or mass on either side.  The sites are well healing.  There is mild drainage from the right side that can be expressed with  palpation.  There is no erythema.  Pedal pulses are 2+ and equal  bilaterally.   I gave Mr. Nees a prescription for Keflex 500 mg t.i.d. for 1 week.  He  may have a mild superficial infection and I am going to treat him with  his recent stent placement.  He certainly does not have any signs of  systemic infection and the local area appears mildly infected if at all.  In addition, Mr. Charland is undergoing a lot of stress at present.  His  wife as been ill and his son is in the hospital undergoing a procedure.  He tells me he is having a difficult time with all this and requested an  anxiolytic.  I gave him a prescription for Valium 5 mg to be taken twice  daily as needed #20 with no refills.  He will be in next week for his  post procedure ABI.     [name]. Excell Seltzer, MD  Electronically Signed    MDC/MedQ  DD: 04/27/2007  DT: 04/27/2007  Job #: [id number]   cc:   Everardo Beals. Juanda Chance, MD, [facility]

## 2010-08-10 NOTE — Consult Note (Signed)
[name], [name] NO.:  [id number]   MEDICAL RECORD NO.:  [id number]          PATIENT TYPE:  INP   LOCATION:  3714                         FACILITY:  [facility]   PHYSICIAN:  Rollene Rotunda, MD, FACCDATE OF BIRTH:  18-Aug-1940   DATE OF CONSULTATION:  06/29/2007  DATE OF DISCHARGE:  06/29/2007                                 CONSULTATION   PRIMARY CARE PHYSICIAN:  Valetta Mole. Swords, MD.   CARDIOLOGISTEverardo Beals. Juanda Chance, MD, [facility].   REASON FOR CONSULTATION:  Evaluate patient with shoulder and arm  discomfort.   HISTORY OF PRESENT ILLNESS:  The patient is a very pleasant 70 year old  gentleman with extensive cardiac history and multiple PCIs.  He has been  doing relatively well.  He does have some chronic dyspnea on exertion.  He says he gets short of breath walking through his house.  However,  this is not new.  He does minimal activity such as vacuuming.  He has to  stop to do this.  He had not been getting any chest or shoulder or arm  discomfort until yesterday.  He had been having three weeks and a cough  productive of a white sputum.  He had not sought any help for this.  He  was not have any fevers or chills.  He was not having any acute  exacerbation of shortness of breath.  Yesterday, however, he developed  left shoulder and arm discomfort.  This was similar to his previous  angina.  It was sharp.  It was 6/10 in intensity.  It happened around  10:00 a.m. at rest.  He finally presented to Dr. Amador Cunas who  referred him for admission.  His pain finally when away around 3:00 in  the morning.  He does not think he got anything for management of this.  It had eased significantly prior to disappearing completely.  He is now  pain-free.  He said the discomfort was associated with some mild nausea.  He, again, had no acute exacerbation of his breathing.  He sleeps  chronically on two pillows but did not have any new orthopnea or PND.  He had no palpitations,  presyncope or syncope.   The patient's past coronary history is described below.  His last stress  perfusion study was October 2008, which he reports is normal and must  have been in the office.  There was mention in an old record that this  demonstrated inferior scar with no ischemia.   PAST MEDICAL HISTORY:  1. Coronary artery disease (I do not have complete details of the      patient's multiple interventions.  His last catheterization was in      2006.  His EF was 65%.  Left main had 40% stenosis.  The LAD had      30% proximal stenosis.  There was apparently stent in the LAD,      though I did not see mention of this, there was a ramus      intermediate with 50% proximal stenosis, circumflex at OM-2 50%  stenosis, right coronary artery had 40% distal stenosis.  There was      a mid stent which was patent.  He had diffuse nonobstructive      disease.)  2. Peripheral vascular disease (status post stent to the left common      iliac in 2008, and of the left external iliac in January 2009).  3. Hyperlipidemia.  4. Hypertension.  5. Previous GI bleed.  6. Osteoporosis.  7. Amaurosis fugax.  8. Gastroesophageal reflux disease.   PAST SURGICAL HISTORY:  1. Appendectomy.  2. Cholecystectomy.  3. Partial thyroidectomy.   ALLERGIES:  TICLID.   MEDICATIONS:  1. Atenolol 100 mg daily.  2. Plavix 75 mg daily.  3. Altace 10 mg daily.  4. Crestor 10 mg daily.  5. Aspirin 81 mg daily.  6. Calcium.  7. Vitamin E.  8. Nitroglycerin.  9. Protonix 40 mg daily.  10.Norvasc 10 mg daily.   SOCIAL HISTORY:  The patient is a recent widower.  He continues to smoke  cigarettes, still smoking what he says is less than a pack a week.  He  has been smoking for 50 years.  He does not drink alcohol.  He is  retired.   FAMILY HISTORY:  Contributory for his mother dying of myocardial  function at 38 and his father dying of an MI at 32.   REVIEW OF SYSTEMS:  As stated in HPI, otherwise  negative for other  systems.   PHYSICAL EXAMINATION:  GENERAL APPEARANCE:  The patient is in no acute  distress.  VITAL SIGNS:  Blood pressure 109/65, heart rate 69 and regular,  afebrile.  HEENT:  Eyelids are unremarkable.  Pupils equal, round and reactive,  fundi not visualized, oral mucosa unremarkable.  NECK:  No jugular venous distension at 45 degrees, carotid upstroke  brisk and symmetric.  No bruits, no thyromegaly.  LYMPHATICS:  No cervical, axillary or inguinal adenopathy.  LUNGS:  Clear to auscultation bilaterally without wheezing or crackles.  BACK:  No costovertebral angle tenderness.  CHEST:  Unremarkable.  HEART:  PMI not displaced or sustained, S1 and S2 within normal.  No S3,  no S4, 2/6 apical nonradiating systolic murmur early peaking, no  diastolic murmurs.  ABDOMEN:  Flat, positive bowel sounds, normal in frequency and pitch, no  bruits, rebound, guarding or midline pulsatile mass.  No hepatomegaly,  no splenomegaly.  SKIN:  No rashes, no nodules.  EXTREMITIES:  2+ pulses throughout, no edema, cyanosis or clubbing.  NEUROLOGIC:  Oriented to person, place and time.  Cranial nerves II-XII  grossly intact, motor grossly intact throughout.   EKG no active cardiopulmonary disease, COPD.   LABORATORY DATA:  Cardiac enzymes x3 negative.  Sodium 139, potassium  3.8, BUN 13, creatinine 0.81.  WBC 5.4, hemoglobin 10.8, platelets 210.   EKG:  Sinus rhythm, rate 68, axis within normal limits, intervals within  normal limits.  No acute ST-wave changes.   ASSESSMENT/PLAN:  1. Shoulder discomfort.  The patient's shoulder discomfort is similar      to his previous angina.  There was, however, no objective evidence      of ischemia.  His enzymes have been negative.  His EKG is normal.      He does have an extensive cardiac history.  Given this, I think he      can be safely discharged on his previous regimen.  He is instructed      to use nitroglycerin which he could not  use  yesterday.  He is      instructed to present back with any worsening symptoms.  The plan      is early next week, an outpatient perfusion study.  He would not be      able to walk this because of dyspnea.  I do think he would tolerate      adenosine as I do not hear any wheezing and he has not had of long      history of reactive airway disease.  He has tolerated adenosine in      the past.  Will then get follow up very soon with Dr. Juanda Chance.  2. Tobacco.  He is educated again about the need to completely stop      smoking.  3. Dyspnea.  I suspect some chronic lung disease and will defer to Dr.      Cato Mulligan.  There is no evidence that this is related to volume      overload.  4. Hyperlipidemia.  He remains on Statin.  The goal will be an LDL      less than 70 and HDL greater than 40 given his ongoing smoking and      extensive disease.  5. Hypertension.  Blood pressure is controlled on meds as listed.      Rollene Rotunda, MD, [facility]  Electronically Signed     JH/MEDQ  D:  06/29/2007  T:  06/29/2007  Job:  [id number]   cc:   Valetta Mole. Cato Mulligan, MD  Everardo Beals. Juanda Chance, MD, [facility]

## 2010-08-10 NOTE — Consult Note (Signed)
NEW PATIENT CONSULTATION   [name], [name]  DOB:  11-05-1940                                        September 23, 2009  CHART #:  [id number]   REASON FOR CONSULTATION:  Severe multivessel coronary artery disease  with unstable angina.   CLINICAL HISTORY:  I was asked by Dr. Juanda Chance to evaluate the patient for  consideration of coronary artery bypass graft surgery.   He is a 70 year old gentleman with a long history of coronary artery  disease status post multiple percutaneous interventions over the years  who reports that over the past several months, he has had recurrent  episodes of chest discomfort that he describes as beginning over his  left shoulder blade and radiating around the side of his chest and into  his shoulder.  This was associated with shortness of breath and fatigue.  These episodes have occurred with exertion but also at night while lying  in bed.  They were relieved with nitroglycerin.  He also reports  decreased exertional tolerance and shortness of breath with walking any  distance and this has worsened over the past few months.  He was seen by  Dr. Juanda Chance and had his Imdur increased and he has not had used any  sublingual nitroglycerin over the past few weeks.  He had a cardiac  catheterization done on Aug 11, 2009, and this showed about 40% distal  left main stenosis.  The LAD was heavily calcified and diffusely  irregular with 70-80% proximal stenosis and 70% mid stenosis.  The left  circumflex gave rise to a moderate intermediate branch, had 80% ostial  and proximal stenosis.  There was small first marginal at 70% stenosis  and a moderate second marginal that had 70% stenosis.  The right  coronary artery was also heavily calcified diffusely irregular with  about 50% midvessel stenosis and 70% stenosis in the distal vessel  before the takeoff of the posterior descending branch.  Ejection  fraction about 60% with hypokinesis at the mid inferior  wall.  There was  no gradient across the aortic valve.   REVIEW OF SYSTEMS:  GENERAL:  He denies any fever or chills.  Had no  recent weight changes.  He does report fatigue.  EYES:  He has had cataract surgery last year.  ENT:  Negative.  ENDOCRINE:  He denies diabetes and hypothyroidism.  CARDIOVASCULAR:  As above.  He has had no PND or orthopnea.  Denies  palpitations.  He has had some peripheral edema over the past few  months.  This usually occurs with the end of the day.  RESPIRATORY:  He denies cough and sputum production.  Does occasionally  wheeze.  GI:  He has had no nausea or vomiting.  Denies melena and bright red  blood per rectum.  He does have a history of hiatal hernia and reflux.  GU:  He denies dysuria and hematuria.  VASCULAR:  He does report some history of claudication in his calves,  but now get short of breath before he can walk far enough to get  claudication.  He has no history of phlebitis or varicose veins.  NEUROLOGICAL:  He denies any focal weakness or numbness.  Denies  dizziness or syncope.  He has never had TIA or stroke.  MUSCULOSKELETAL:  Denies arthralgias and myalgias.  PSYCHIATRIC:  Negative.  HEMATOLOGIC:  Negative.   ALLERGIES:  Ticlid, which cause mouth and tongue swelling.   PAST MEDICAL HISTORY:  Significant for hypertension and hyperlipidemia.  History of gastroesophageal reflux disease.  He has a history of  coronary artery disease status post multiple percutaneous interventions.  He has a history of peripheral vascular disease status post stenting of  a totally occluded iliac artery and status post stenting of an external  iliac in January 2009.  He is status post bilateral inguinal hernia  repair and ventral hernia repair last year.   MEDICATIONS:  Crestor 10 mg at nighttime, Protonix 40 mg b.i.d.,  Tenormin 100 mg daily, triamcinolone 0.025% p.r.n., Caltrate 600 plus D  1 tablet daily, vitamin E 800 units daily, aspirin 325 mg daily,  Norvasc  10 mg daily, Altace 10 mg daily, Plavix 75 mg daily, Spiriva 18 mcg  inhaled daily, Ventolin HFA 108 mcg 2 inhalations q.4 h. p.r.n., vitamin  B12 shot monthly, oxycodone p.r.n. for pain, Forteo 600 mcg per 2.4 mL  solution 20 mcg daily as directed, insulin pen needle as directed with  Forteo, Imdur 60 mg daily, dicyclomine hydrochloride 20 mg daily,  tamsulosin 0.4 mg daily, and Chantix 1 mg daily.   SOCIAL HISTORY:  He is widowed and retired.  He has 3 children.  He is  here with his niece today.  He is a smoker and is trying to quit.  He  said he smoked about 2 packs of cigarettes over 3 weeks.  He was smoking  1-2 pack of cigarettes per day previously.  He denies alcohol abuse.   FAMILY HISTORY:  Strongly positive for cardiac disease in his father and  brothers.   PHYSICAL EXAMINATION:  General:  He is a well-developed white male in no  distress.  Vital Signs:  Blood pressure 129/76, pulse 68 and regular,  respiratory rate is 18 unlabored.  Oxygen saturation on room air is 92%.  He has 65 inches tall and weighs 167 pounds.  HEENT:  Normocephalic and  atraumatic.  Pupils are equal and reactive to light and accommodation.  Extraocular muscles are intact.  His throat is clear.  Neck:  Normal  carotid pulses bilaterally.  There are no bruits.  There is no  adenopathy or thyromegaly.  Cardiac:  Regular rate and rhythm with  normal S1 and S2.  There is no murmur, rub, or gallop.  Lungs:  Clear.  Abdomen:  Active bowel sounds.  His abdomen is soft, mildly obese and  nontender.  There are no palpable masses or organomegaly.  Extremities:  No peripheral edema.  Pedal pulses are nonpalpable.  There is mild edema  in the right ankle.  Skin:  Warm and dry.  Neurologic:  Alert and  oriented x3.  Motor and sensory exam is grossly normal.   IMPRESSION:  The patient has moderate left main and three-vessel  coronary artery disease with unstable anginal symptoms, I have improved  with an  increase in his Imdur.  He does have lifestyle-limiting  exertional fatigue and shortness of breath.  I discussed the option of  coronary artery bypass graft surgery with he and his niece.  We  discussed the alternative of continued medical therapy, although I  explained him that he would likely continue to have the same level of  exertional fatigue and shortness of breath that may worsen over the  time.  I discussed the benefits and risks of surgery including but not  limited to bleeding,  blood transfusion, infection, stroke, myocardial  infarction, graft failure, and death.  Also discussed the importance of  complete smoking cessation and the likelihood that if he continues to  smoke his coronary bypasses will not last long and he will have  progressive shortness of breath related emphysema.  He understands all  of this and would like to proceed with surgery because of his  significant limitation in his lifestyle.  We will schedule this for  Monday, October 12, 2009.   Evelene Croon, M.D.  Electronically Signed   BB/MEDQ  D:  09/23/2009  T:  09/24/2009  Job:  [id number]   cc:   Everardo Beals. Juanda Chance, MD, [facility]

## 2010-08-10 NOTE — Progress Notes (Signed)
[facility]                        PERIPHERAL VASCULAR OFFICE NOTE   NAME:Ratajczak, KEIMON BASALDUA                          MRN:          [id number]  DATE:01/31/2007                            DOB:          11-05-1940    [name] was seen in followup at the [facility] peripheral vascular office  on January 31, 2007.  Mr. Basso is a very nice 70 year old gentleman who  underwent left common iliac stenting for total occlusion of that vessel  on October 22nd.  I placed an 8 mm stent in the common iliac artery and  obtained a good result from that.  He previously had an ABI of 0.46 on  the left and had severe claudication symptoms involving the left  buttock, thigh, and calf with minimal activity.  Since the procedure, he  is doing very well.  His left leg pain has essentially resolved.  He is  able to walk without limitation.  He tells me that he has developed some  tingling and burning behind the left knee when he walks but no symptoms  involving the buttock, thigh, or calf.  He has no other complaints.   ABIs in the office today were 1 on the right and 0.96 on the left with  biphasic waveforms on both sides.   CURRENT MEDICATIONS:  1. Calcium plus D 2 daily.  2. Vitamin E 400 IU daily.  3. Aspirin 81 mg daily.  4. Crestor 10 mg daily.  5. Protonix 40 mg daily.  6. Norvasc 10 mg daily.  7. Altace 10 mg daily.  8. Tenormin 100 mg daily.  9. Plavix 75 mg daily.  10.Boniva 150 mg daily.   ALLERGIES:  TICLID.   PHYSICAL EXAMINATION:  Patient is alert and oriented in no acute  distress.  Weight is 160.  Blood pressure is 130/80, heart rate 74, respiratory  rate 12.   LUNGS:  Clear to auscultation bilaterally.  HEART:  Regular rate and rhythm without murmurs or gallops.  ABDOMEN:  Soft and nontender.  No organomegaly.  EXTREMITIES:  Femoral, posterior tibial, and dorsalis pedis pulses are  2+ and equal bilaterally.  There are soft bilateral femoral bruits.  Both groin access sites are clear without hematoma or ecchymoses.   ASSESSMENT:  Mr. Debord is doing very well after left leg  revascularization with iliac stenting.  His claudication symptoms have  resolved.  I would like to see him back in six months with ankle  brachial indices at that time.  I have encouraged him to begin a regular  walking program once again.  He is already on dual antiplatelet therapy  with aspirin and Plavix and will remain on  these.  He continues with aggressive second risk reduction for his  coronary and peripheral arterial disease under the care of Dr. Juanda Chance  and Dr. Cato Mulligan.     [name]. Excell Seltzer, MD  Electronically Signed    MDC/MedQ  DD: 01/31/2007  DT: 01/31/2007  Job #: [id number]   cc:   Everardo Beals. Juanda Chance, MD, [facility]  Bruce H. Swords, MD

## 2010-08-10 NOTE — Progress Notes (Signed)
[facility]                        PERIPHERAL VASCULAR OFFICE NOTE   NAME:[name], [name]                          MRN:          [id number]  DATE:01/08/2008                            DOB:          02-Feb-1941    REASON FOR VISIT:  Lower extremity PAD.   HISTORY OF PRESENT ILLNESS:  Mr. Jezewski is a 70 year old gentleman who  underwent left iliac stenting in January 2009.  He underwent 2  procedures and required stents in the common and external iliac artery.  He has done well following stenting and has no residual claudication  symptoms.  He is currently able to walk without limitation from leg  pain.  He does admit to exertional dyspnea which is a chronic problem  and limits his activity level to some degree.  He denies chest pain, TIA  symptoms, or skin ulcerations.  He does have nocturnal leg cramps.  He  also complains of mild leg edema at the end of the day.  He continues to  smoke one-pack of cigarettes daily.   MEDICATIONS:  1. Calcium 600 mg plus D two daily.  2. Vitamin E 400 international units daily.  3. Aspirin 81 mg daily.  4. Crestor 10 mg daily.  5. Protonix 40 mg daily.  6. Norvasc 10 mg daily.  7. Altace 10 mg daily.  8. Tenormin 50 mg 2 daily.  9. Plavix 75 mg daily.  10.Boniva 150 mg monthly.  11.Dicyclomine 20 mg daily.   ALLERGIES:  TICLID.   PHYSICAL EXAMINATION:  GENERAL:  The patient is alert and oriented.  He  is in no acute distress.  VITAL SIGNS:  Weight 161 pounds, blood pressure 140/78, heart rate 64,  respiratory rate 16.  HEENT:  Normal.  NECK:  Normal carotid upstrokes, bilateral bruits.  JVP normal.  LUNGS:  Clear bilaterally.  HEART:  Regular rate and rhythm.  No murmurs or gallops.  ABDOMEN:  Soft, nontender, no organomegaly.  EXTREMITIES:  Femoral pulses are 2+ and equal.  Posterior tibial pulses  are 2+ and equal, unable to palpate dorsalis pedis pulses.  There is no  edema.  SKIN:  Warm and dry without  rash or ulceration.   ABIs from the office today showed triphasic wave forms and ABIs of 1.13  on the right and 1.03 on the left.   ASSESSMENT:  1. Lower extremity peripheral arterial disease.  The patient is      asymptomatic status post left iliac stenting.  Continue current      therapy.  Tobacco cessation counseling was done.  Followup ABIs in      office visit in 12 months.  Encourage walking as much as possible.  2. Bilateral carotid stenosis.  Last carotid duplex scan was in      November 2007.  At that time he had 40-59% right internal carotid      artery stenosis and 0-39% left internal carotid artery stenosis.      He is due for followup duplex scanning.   For followup, I would like to see Mr. Helzer back in 12 months  with ABIs.  We will be in touch with him when the results of his carotid duplex are  available.     [name]. Excell Seltzer, MD  Electronically Signed    MDC/MedQ  DD: 01/08/2008  DT: 01/09/2008  Job #: [id number]

## 2010-08-10 NOTE — Op Note (Signed)
[name], [name] NO.:  [id number]   MEDICAL RECORD NO.:  [id number]          PATIENT TYPE:  OIB   LOCATION:  5156                         FACILITY:  [facility]   PHYSICIAN:  Ardeth Sportsman, MD     DATE OF BIRTH:  1940-07-21   DATE OF PROCEDURE:  10/10/2008  DATE OF DISCHARGE:                               OPERATIVE REPORT   PRIMARY CARE PHYSICIAN:  Valetta Mole. Swords, MD   CARDIOLOGIST:  Everardo Beals Juanda Chance, MD, [facility].   GASTROLOGIST:  Hedwig Morton. Juanda Chance, MD   SURGEON:  Ardeth Sportsman, MD   ASSISTANT:  None.   PREOPERATIVE DIAGNOSIS:  Bilateral inguinal hernia.   POSTOPERATIVE DIAGNOSIS:  Bilateral inguinal hernia.   PROCEDURE PERFORMED:  1. Lysis of adhesions x 60 minutes.  2. Laparoscopic bilateral inguinal hernia with mesh.   ANESTHESIA:  1. General anesthesia.  2. Bilateral ilioinguinal/genitofemoral/spermatic cord nerve blocks.  3. Local anesthetic in a field block around all port sites.   SPECIMENS:  None.   DRAINS:  None.   ESTIMATED BLOOD LOSS:  Less than 10 mL.   COMPLICATIONS:  No major complications.   INDICATIONS:  [name] is a 70 year old male with numerous  cardiopulmonary issues who has been having  increasingly symptomatic  inguinal hernias, left greater than right.  He was sent to me for  surgical evaluation.  I felt he had bilateral inguinal hernias, left  greater than right.   The anatomy and embryology of abdominal wall formation and testicular  migration was explained.  Pathophysiology of inguinal herniation with  its risks of incarceration, strangulation were discussed.  Options  discussed and recommendation was made for laparoscopic bilateral  preperitoneal exploration and repair of hernias.  Risks, benefits, and  alternatives were discussed.  Questions were answered and he agreed to  proceed.   OPERATIVE FINDINGS:  He had a left large indirect hernia with a moderate  size cord lipoma.  He had a smaller indirect hernia with  somewhat  directed with a small direct defect on the right as well.   DESCRIPTION OF PROCEDURE:  Informed consent was confirmed.  The patient  received IV antibiotics just prior to surgery.  He had sequential  compression devices active during the entire case.  He has held his  aspirin and Plavix preoperatively.  He underwent general anesthesia  without any difficulty.  He had a Foley catheter sterilely placed.  His  abdomen and mons pubis were clipped, prepped, and draped in sterile  fashion.  A surgical time-out confirmed our plan.   Entry was gained into the preperitoneal space through an infraumbilical  curvilinear incision.  A 12 mm port was placed from left retrorectal  space using a Hasson technique.  Capnopreperitoneum 15 mmHg provided  good abdominal wall insufflation.  Camera dissection was used to free  the peritoneum off bilateral lower quadrants.  Enough working space was  created such that a 5 mm port could be placed in the right and left mid  abdomen.  Attention was turned towards the right side.  He had had  a  prior open appendectomy still significant.  Lysis of adhesions were  needed to get into the preperitoneum place.  There was a tear in the  peritoneum and freeing this down secondary to scar tissue that was  controlled using a 3-0 Vicryl stitches using laparoscopic intracorporeal  suturing.   Peritoneum could be seen crawling up with the spermatic cord into a  dilated internal ring system with a moderate indirect inguinal hernia.  Peritoneum was carefully freed off the spermatic cord and peeled back as  proximally as possible.  There was a direct defect as well of some fatty  preperitoneal fat up into that region that was peeled back as well.  A  window was made between the true pelvis and the lateral wall.  The  peritoneum was peeled off the spermatic cord as proximally as possible,  in doing that there were a tear in the  hernia sac at its base.  This  was  closed laparoscopically using intracorporeal suturing again.  This  also served as a good high ligation.   Attention was turned towards the left side.  Dissection was carried out  in a mirror-image fashion.  There was less preperitoneal adhesions on  the anterior abdominal wall.  However, he had a larger hernia sac and it  took a significant amount of time to help ultimately free that and  skeletonize that off.  He was very elastic with a moderate-size cord  lipoma.  Extra time was made to prevent injury to the  spermatic cord  since it was rather intimately involved with that but I  was able to  free it down safely.  Again I peeled back as proximally as possible.  Again, there was a small tear in the peritoneum.  At this place, I used  an opportunity to high ligation there as well using laparoscopic  intracorporeal suturing.  Hemostasis was excellent.   A 15 x 15 cm ultra-lightweight polypropylene (UltraPro) mesh was used  for each side.  Each piece of mesh was cut with a 5 x 5 cm slit to  create a medial and inferior flap to give the mesh a sort of a half  skull shape.  The mesh was placed in the preperitoneal space in each  side.  The medial and inferior flap rested in the true pelvis between  lateral pelvic wall and the anterior medial bladder.  This will help  cover obturator and femoral regions as well as any direct space and the  internal hernias.  The mesh provided excellent circumferential coverage  of several inches around these defects.  The mesh was tucked well  underneath the peritoneal fold.  Mesh was held in place,  capnopreperitoneum was evacuated.  Ports were removed.  The  infraumbilical fascial defect was closed using 0 Vicryl interrupted  stitches.  Skin was closed using 4-0 Monocryl stitch.  Sterile dressing  was applied.   The patient was extubated and sent to recovery room in stable condition.   I discussed postoperative care with the patient in detail and  confirmed  it with his daughter as well.      Ardeth Sportsman, MD  Electronically Signed     SCG/MEDQ  D:  10/10/2008  T:  10/11/2008  Job:  [id number]   cc:   Valetta Mole. Cato Mulligan, MD  Everardo Beals. Juanda Chance, MD, [facility]  Hedwig Morton. Juanda Chance, MD

## 2010-08-10 NOTE — Assessment & Plan Note (Signed)
OFFICE VISIT   Jeffery, Newman  DOB:  03-20-1941                                        November 16, 2009  CHART #:  [id number]   HISTORY:  The patient comes in today for routine 3-week postoperative  followup.  He is status post coronary artery bypass grafting x5 on October 12, 2009.  His postoperative course was complicated by atrial  fibrillation which had converted to normal sinus rhythm at the time of  discharge.  Also, he developed some sternal drainage and sternal click  with cough.  A CT scan was performed which showed no evidence of sternal  dehiscence.  He was treated with p.o. Augmentin and he was discharged  home on October 22, 2009, in good condition.  He did require home oxygen at  discharge.  Since his discharge home, he has been back in the office on  October 27, 2009, for wound recheck and at that time appeared to be doing  well.  Since then, he has completed his antibiotics.  His sternal wound  has completely healed and he has had no evidence of drainage from the  incision or the chest tube sites.  He denies any fevers or chills.  He  still has slight nonproductive cough, but this is overall much better.  He does not require his oxygen at this point and is maintaining sats of  greater than 90% on room air and denies any shortness of breath.  He  still has some chest discomfort, particularly at night, and is still  taking the oxycodone p.r.n. at bedtime.  He has not seen Dr. Juanda Chance in  follow up.   PHYSICAL EXAMINATION:  Vital Signs:  Blood pressure is 119/81, pulse is  85 and regular, respirations 18, O2 sat 92% on room air.  Chest:  His  sternal wound and chest tube sites have healed completely with no  evidence of erythema or drainage.  The sternum is stable to palpation  with no clicks or pops.  Heart:  Regular rate and rhythm without  murmurs, rubs, or gallops.  Lungs:  Clear to auscultation.  His right  lower extremity EVH sites have healed well  and he has no lower extremity  edema on physical exam.   PHYSICAL EXAMINATION:  Chest x-ray shows sternum to be well healed with  no evidence of dehiscence, also no atelectasis and only a small left  pleural effusion which is improved from a previous x-ray.   ASSESSMENT AND PLAN:  The patient is overall progressing well status  post coronary artery bypass grafting.  He is maintaining sinus rhythm  and hopefully he will be able to discontinue the amiodarone soon.  I  have asked him to get an appointment as soon as possible with Dr. Juanda Chance  for follow up.  I have given him a refill on oxycodone 5 mg one to two  q.4 h. p.r.n. for pain #30 with no refills as he is still taking this  mainly at bedtime.  He does not appear to be interested in cardiac rehab  at this time, although I encouraged him to pursue if at all possible.  He is ambulating frequently at home and is encouraged to continue to do  so.  Also, we will discontinue his home O2 as his sats have  significantly improved.  At this point, he may begin to drive and to  increase his activity as tolerated.  We will see him back p.r.n. for  follow up, and he may call if he experiences any problems.   Evelene Croon, M.D.  Electronically Signed   GC/MEDQ  D:  11/16/2009  T:  11/17/2009  Job:  [id number]   cc:   Everardo Beals. Juanda Chance, MD, [facility]

## 2010-08-10 NOTE — H&P (Signed)
[name], [name] NO.:  [id number]   MEDICAL RECORD NO.:  [id number]          PATIENT TYPE:  INP   LOCATION:  3714                         FACILITY:  [facility]   PHYSICIAN:  Gordy Savers, MDDATE OF BIRTH:  Mar 17, 1941   DATE OF ADMISSION:  06/28/2007  DATE OF DISCHARGE:                              HISTORY & PHYSICAL   CHIEF COMPLAINT:  Left shoulder pain.   HISTORY OF PRESENT ILLNESS:  The patient is a 70 year old gentleman with  a history of extensive coronary artery disease.  He has had multiple  percutaneous coronary interventions in the past.  He states that in  October 2008, he had a negative adenosine Cardiolite stress test.  This  was reviewed and revealed an inferior scar, but no ischemia.  The  patient was stable until approximately 10:00 a.m. on the day of  admission when he developed left shoulder discomfort.  This was  described as a constant aching sensation that has persisted throughout  the day.  After lunch, he developed nausea and he also states that he  has developed worsening dyspnea.  He does have a history of COPD, but  states that his dyspnea is somewhat worse.  The patient states that with  his prior acute coronary syndromes, they all presented with back pain,  but never chest pain or shoulder discomfort.  He states that the nausea  and shortness of breath, however, are very similar to his prior ischemic  episodes.  The patient was seen in the office at approximately 5:00 p.m.  with persistent left shoulder pain.  He did state that the pain was  aggravated by range of motion involving the shoulder.  An EKG was  obtained that revealed no ischemic changes.  The patient is now admitted  for further evaluation and treatment and to exclude an acute coronary  syndrome.   PAST MEDICAL HISTORY:  The patient has a history of extensive coronary  artery disease, status post multiple PCIs and a history of myocardial  infarction.  He has a  history of peripheral vascular occlusive disease.  In October 2008, he underwent stenting of the left iliac artery due to  severe claudication involving the buttock area.  In January 2009, he  underwent stenting of the left external iliac artery.  He has history  also of hyperlipidemia, hypertension and osteoporosis.  Additionally,  allergic rhinitis.  There is also a history of ongoing tobacco use.  In  1998, he had a blood clot involving the left eye, details of this are  unclear.   PAST SURGICAL HISTORY:  1. Positive for multiple PTCAs with stenting.  2  He has had a partial thyroidectomy.   FAMILY HISTORY:  Father died of an MI at age 61.  Mother died at age 21  with a history of rheumatoid arthritis.  Two brothers and a nephew with  coronary artery disease.   SOCIAL HISTORY:  The patient has been widowed recently, his wife passing  away in February 2009.  He continues to smoke, but has attempted to cut  down.  There is no regular exercise and his symptoms seem limited by his  chronic dyspnea.   REVIEW OF SYSTEMS:  Unremarkable except as mentioned in the history of  present illness.   PHYSICAL EXAMINATION:  GENERAL:  Revealed the patient to be moderately  overweight.  VITAL SIGNS:  Blood pressure 130/82.  SKIN:  Warm and dry without rash.  HEAD/NECK:  Normal pupil responses.  Conjunctivae clear.  ENT  unremarkable.  NECK:  No audible bruits.  No adenopathy.  CHEST:  Revealed generally diminished breath sounds, but clear.  CARDIOVASCULAR:  Normal S1-S2, no murmurs.  ABDOMEN:  Soft and nontender.  No organomegaly or masses.  The left  femoral pulse seemed diminished.  The right femoral pulse was intact  with a faint bruit.  EXTREMITIES:  Revealed no edema.  Peripheral pulses were surprisingly  normal.  Examination of the left shoulder did reveal some mild  discomfort with range of motion, especially external abduction.   IMPRESSION:  Left shoulder pain associated with  nausea, increasing  dyspnea in a patient with prior history of coronary artery disease, rule  out acute coronary syndrome.   DISPOSITION:  The patient will be admitted to the hospital to a  telemetry setting.  Serial cardiac enzymes will be reviewed.  An EKG  will also be reviewed in the morning.  The patient will be maintained on  his multiple medications, that include statin therapy and beta-blockers.      Gordy Savers, MD  Electronically Signed     PFK/MEDQ  D:  06/28/2007  T:  06/29/2007  Job:  [id number]

## 2010-08-10 NOTE — H&P (Signed)
NAMEWILBERTH, [name] NO.:  [id number]   MEDICAL RECORD NO.:  [id number]          PATIENT TYPE:  INP   LOCATION:  5123                         FACILITY:  [facility]   PHYSICIAN:  Gordy Savers, MDDATE OF BIRTH:  01-04-1941   DATE OF ADMISSION:  09/06/2008  DATE OF DISCHARGE:                              HISTORY & PHYSICAL   CHIEF COMPLAINT:  Rectal bleeding.   HISTORY OF PRESENT ILLNESS:  The patient is a 70 year old patient who  underwent colonoscopy with polypectomy approximately 7 days ago.  At  approximately 3:30 of the day of admission, he developed frequent rectal  bleeding.  Stool was described as dark with clotting.  Prior to his  arrival to the ED, he had 5 episodes of hematochezia.  Since his arrival  in the ED, he has had 2 additional episodes of rectal bleeding.  The  patient states that for the past 3 weeks he has had lower abdominal  discomfort.  Apparently, this has not intensified following his  colonoscopy 1 week ago.  ED evaluation revealed a hemoglobin of 11 and  hematocrit 34.4.  The patient has a history of coronary artery disease  as well as peripheral vascular disease, status post multiple  interventions.  He is on both aspirin and Plavix therapy.  He is now  admitted for further evaluation and treatment of his lower GI bleeding.   PAST MEDICAL HISTORY:  The patient has a history of coronary artery  disease and has had multiple PCIs in the past as well as a prior MI.  He  has peripheral vascular occlusive disease and in October 2008 underwent  stenting of the left iliac artery and in January 2009 underwent stenting  of the left external iliac artery.  He has hypertension, osteoporosis,  and dyslipidemia.  He has allergic rhinitis and ongoing tobacco use.  He  has a history of amaurosis fugax in 1998.  Past medical history is also  pertinent for a prior partial thyroidectomy.   Medical regimen includes:  1. Altace 10 mg daily.  2.  Aspirin 81 mg daily.  3. Boniva 150 mg daily.  4. Calcium supplementation.  5. Crestor 10 mg daily.  6. Norvasc 10 mg daily.  7. Plavix 75 mg daily.  8. Spiriva hand inhaler daily.  9. Tenormin 100 mg daily.  10.Ventolin metered-dose inhaler 2 inhalations every 6 hours as needed      for shortness of breath.   FAMILY HISTORY:  Father died of an MI at age 66.  Mother died at 15 with  a history of RA.  Two brothers and a nephew with coronary artery  disease.   SOCIAL HISTORY:  The patient is recently widowed.  His wife deceased in  06-06-2007.  He continues to smoke.  Fairly sedentary and limited by  his dyspnea on exertion.   REVIEW OF SYSTEMS:  Otherwise unremarkable except as mentioned in the  history of present illness.   PHYSICAL EXAMINATION:  GENERAL:  A well-developed, a mildly overweight  male in no acute distress.  VITAL SIGNS:  Blood pressure was 120/75.  Pulse rate normal.  SKIN:  Warm and dry without rash.  HEAD AND NECK:  Normal pupil responses.  Conjunctiva are clear.  ENT:  Normal.  NECK:  Supple.  No bruits.  No adenopathy.  CHEST:  Clear.  Breath sounds are slightly diminished.  CARDIOVASCULAR:  Normal S1 and S2.  No murmurs.  ABDOMEN:  Soft.  He did have a large left inguinal hernia.  No bruits  were appreciated.  A faint right femoral bruit has been noted in the  past.  Pedal pulses were full.  ABDOMEN:  Tenderness in the lower quadrants, most marked involving the  left lower quadrant.  Bowel sounds were present.   IMPRESSION:  Post polypectomy, lower gastrointestinal bleeding in the  setting of antiplatelet therapy.   ADDITIONAL DIAGNOSES:  1. Coronary artery disease, status post multiple interventions.  2. Peripheral vascular occlusive disease.  3. Hypertension.  4. Dyslipidemia.   DISPOSITION:  The patient will be admitted to the hospital.  He will be  supported with IV fluids.  Serial H&H will be monitored.  Blood will be  typed and crossed.  A  GI consult will be obtained and Cardiology will be  consulted about his antiplatelet therapy.      Gordy Savers, MD  Electronically Signed     PFK/MEDQ  D:  09/06/2008  T:  09/06/2008  Job:  [id number]

## 2010-08-10 NOTE — Op Note (Signed)
NAMEORAN, DILLENBURG                   ACCOUNT NO.:  [id number]   MEDICAL RECORD NO.:  [id number]          PATIENT TYPE:  AMB   LOCATION:  SDS                          FACILITY:  [facility]   PHYSICIAN:  [name]. Excell Seltzer, MD  DATE OF BIRTH:  [birth year]   DATE OF PROCEDURE:  01/17/2007  DATE OF DISCHARGE:                               OPERATIVE REPORT   PROCEDURE:  Abdominal aortography, distal abdominal aortography with  runoff, percutaneous transluminal angioplasty and stenting of left  common iliac occlusion, selective left femoral angiography.   INDICATIONS:  Mr. Gali is a 70 year old gentleman who I saw in the  office for lower extremity peripheral arterial disease.  He has severe  lifestyle-limiting claudication involving the left leg.  His ABIs were  normal on the right at 0.98 and were severely diminished on the left at  0.46.  His noninvasive studies have suggested inflow disease with  probable iliac occlusion on the left.  He was brought in for angiography  and potential PTA.   Risks, indications of procedure were reviewed with the patient, informed  consent was obtained.  The right groin was prepped, draped, anesthetized  with 1% lidocaine using modified Seldinger technique.  A 5-French sheath  was placed in the right femoral artery.  A pigtail catheter was inserted  into the aorta.  The suprarenal aortogram was performed via power  injection.  The pigtail catheter was then brought down to the aortic  bifurcation and oblique images at the aortoiliac region were taken.  A  distal aortogram with runoff to the feet was performed with bolus chase  method.   The diagnostic study demonstrated short total occlusion of the left  common iliac artery.  I elected to attempt intervention on that area.  At that point, the left femoral artery was accessed using a modified  Seldinger technique with fluoroscopic guidance.  A 5-French sheath was  placed.  Using an end-hole catheter and angled  glide wire, I was able to  cross the area of occlusion and access the central aorta.  The Glidewire  was changed out for a Wholey wire and a 7-French bright tip sheath was  inserted into the left femoral artery.  The lesion was first predilated  with a 5 x 4 Powerflex balloon up to 10 atmospheres.  Two inflations  were done.  Aortography demonstrated patency of the vessel.  Following  balloon dilatation with severe residual stenosis.  I then elected to  stent the vessel with an 8 x 39 mm balloon expandable stent.  The stent  was deployed at 10 atmospheres.  Following stent deployment, the vessel  was widely patent.  There was no residual pressure gradient from the  central aorta into the left femoral artery.  A completion of runoff via  the left femoral sheath was performed and demonstrated no compromise of  the distal circulation.  There was excellent runoff to the foot.  The  patient was transferred to the holding area in stable condition where  his sheaths will be pulled once his ACT is less than 175.  FINDINGS:  The abdominal aorta is angiographically normal.  There is  widely patent single bilateral renal arteries.   On the right side the common iliac artery is ectatic.  The right  internal iliac artery is occluded and fills from collaterals.  The right  external iliac artery is diffusely diseased without any significant  stenosis.  The remaining vessels supplying the right leg including the  SFA, deep femoral popliteal vessels have no significant disease.  The  anterior tibial and posterior tibial supply runoff to the foot.  The  peroneal occludes in its distal portion.   On the left side the ostium of the common iliac artery is severely  diseased.  In approximately 2 cm into that vessel there is total  occlusion. The vessel reconstitutes via collateral and the remaining  portions of the common and external iliac artery are diffusely diseased  with apparent 50% stenoses.  The  internal iliac is occluded and fills  via collateral flow.  The left femoral artery common femoral artery is  heavily calcified and has moderate disease.  The left deep femoral and  superficial femoral arteries are patent.  The mid aspect of the  superficial femoral artery has heavy calcification with no significant  stenosis.  The left popliteal artery is patent.  Primary runoff to the  fluid is via the posterior tibial.  The anterior tibial and peroneal  both occlude distally.   ASSESSMENT:  1. Left common iliac occlusion, status post successful stenting with      an 8 mm balloon expandable stent.  2. Two-vessel runoff to the right foot and essentially one-vessel      runoff to the left foot.   DISCUSSION:  Mr. Sternberg should remain on dual antiplatelet therapy with  aspirin and clopidogrel.  He will be counseled once again on tobacco  cessation.  I would like to see him back in clinic in two to four weeks  with follow-up ABIs.      [name]. Excell Seltzer, MD  Electronically Signed     MDC/MEDQ  D:  01/17/2007  T:  01/18/2007  Job:  [id number]   cc:   Everardo Beals. Juanda Chance, MD, [facility]  Bruce H. Swords, MD

## 2010-08-10 NOTE — Op Note (Signed)
[name], [name]                   ACCOUNT NO.:  [id number]   MEDICAL RECORD NO.:  [id number]          PATIENT TYPE:  AMB   LOCATION:  SDS                          FACILITY:  [facility]   PHYSICIAN:  [name]. Excell Seltzer, MD  DATE OF BIRTH:  1940/06/09   DATE OF PROCEDURE:  04/17/2007  DATE OF DISCHARGE:                               OPERATIVE REPORT   PROCEDURE:  Abdominal aortography with runoff, left external iliac PTA  and stenting.   INDICATIONS:  [name] is a 70 year old gentleman with peripheral  arterial disease.  He initially was treated in October 2008 for a total  occlusion of the left common iliac artery.  This was recanalized and  stented.  His ABI on the left went from less than 0.5 up to greater than  0.9, and he had complete central resolution.  In late December 2008, he  developed recurrent symptoms similar to those of his initial  presentation.  He was seen by Dr. Juanda Chance and had an absent femoral pulse  on the left.  His pedal pulses on the left were not palpable.  After  discussion, we elected to repeat angiography with the suspicion of stent  occlusion.   Risks and indications of the procedure were reviewed with the patient.  Informed consent was obtained.  Bilateral groins were prepped and draped  under normal sterile technique.  Using a 5-French sheath, the right  femoral artery was accessed via the modified Seldinger technique without  difficulty.  An angled pigtail catheter was placed in the distal  abdominal aorta.  Abdominal aortography was performed with the power  injection.  An aortogram with runoff was then performed using the bolus  chase method.  The angiogram demonstrated a widely patent stent in the  left common iliac artery, but severe de novo stenosis of the left  external iliac artery.  I elected to intervene in this area, as the  remaining anatomy was essentially stable.  The internal iliac arteries  were occluded bilaterally, with some collateral  flow.  The left femoral  artery was then accessed using a 7-French bright-tip sheath.  A Wholey  wire was passed beyond the area of severe stenosis.  Five thousand units  of heparin was given.  The ACT was 275 seconds.  The lesion was then  stented with 9 mm x 3 cm EverFlex Protege self-expanding stent.  It was  postdilated with an 8 x 3 balloon up to 10 atmospheres.  The stent was  well expanded.  Final angiography demonstrated widely patent stents.  The sizing appeared appropriate.  A pressure gradient was recorded from  the central aorta and left common femoral artery, and there was no  gradient.  The patient tolerated the procedure well and was transferred  to the recovery area in stable condition.   FINDINGS:  The distal aorta has minimal plaque.  It bifurcates into the  iliac arteries, both of which are widely patent.   On the right, the common iliac is widely patent.  There is diffuse  ectasia and nonobstructive disease.  The right  internal iliac is  occluded and fills late via collaterals.  The right external iliac is  patent, with no significant stenosis.  The right common femoral artery  is ectatic but patent.  The deep femoral artery is patent.  The  superficial femoral artery is widely patent.  There is no significant  stenosis throughout.  The popliteal and tibioperoneal trunk are patent.  The anterior tibial and posterior tibial supply excellent two-vessel  runoff to the foot.  The peroneal becomes atretic at the midcalf.   On the left, the common iliac artery is widely patent.  The common iliac  stent is open, with no significant stenosis.  Just beyond the stent,  there is an area of dilated vessel that leads into a severe stenosis at  the junction of the common iliac and external iliac arteries.  Angiographically, it is a 90% eccentric stenosis.  The internal iliac is  occluded.  The common femoral artery is diffusely diseased, but it  appears nonobstructive.  The deep  femoral artery is patent.  The  superficial femoral artery has diffuse 30% stenosis as it enters  Hunter's canal.  The popliteal artery is patent.  The anterior and  posterior tibial arteries are widely patent to the foot.  The peroneal  artery is patent but becomes atretic at the midcalf.   ASSESSMENT:  Severe left external iliac stenosis with successful  stenting using a self-expanding stent.   Mr. Lamour had a severe de novo stenosis of the left external iliac artery  that has just developed in the last few months since his initial  intervention back in October 2008.  He will require continued aggressive  risk factor modification, and efforts at smoking cessation will be  reviewed once again.  He should continue on dual antiplatelet therapy  with aspirin and Plavix.  He will be eligible for discharge home later  today.      [name]. Excell Seltzer, MD  Electronically Signed     MDC/MEDQ  D:  04/17/2007  T:  04/17/2007  Job:  [id number]   cc:   Valetta Mole. Cato Mulligan, MD  Everardo Beals. Juanda Chance, MD, [facility]

## 2010-08-10 NOTE — Discharge Summary (Signed)
[name], [name]                   ACCOUNT NO.:  [id number]   MEDICAL RECORD NO.:  [id number]          PATIENT TYPE:  INP   LOCATION:  3714                         FACILITY:  [facility]   PHYSICIAN:  Bruce Rexene Edison. Swords, MD    DATE OF BIRTH:  1940-04-11   DATE OF ADMISSION:  06/28/2007  DATE OF DISCHARGE:  06/29/2007                               DISCHARGE SUMMARY   DISCHARGE DIAGNOSES:  1. Atypical chest pain.  2. History of coronary artery disease.  3. Chronic obstructive pulmonary disease.  4. Peripheral vascular disease.  5. Hyperlipidemia.  6. Hypertension.  7. Gastroesophageal reflux disease.  8. Peripheral vascular disease.  9. Tobacco abuse.   DISCHARGE MEDICATIONS:  Same medications as on admission.   FOLLOWUP PLAN:  Dr. Cato Mulligan in 2-3 weeks.  Cardiology to schedule outpatient Cardiolite.   CONDITION ON DISCHARGE:  Improved.  Breathing at baseline.  He remains  without chest pain.   HOSPITAL COURSE:  The patient admitted to the hospital service on June 28, 2007, through Dr. Vernon Prey note.  The patient was evaluated for  myocardial infarction.  He is ruled out by enzymes and EKG.  The patient  was seen by Dr. Sharyn Lull who suggested outpatient cardiology.  We will  arrange that.   The patient continues to abuse tobacco.  He has been told countless  times that he must quit.  He understands the need, but is having  difficulty.  I discussed medical options including Chantix and  Wellbutrin.  He will consider these.  He will follow up with me in 2-3  weeks.  He understands not to smoke during that time.  At the time of  discharge, I will have him ambulate and will check a pulse oximetry.  If  his oxygen saturations are low, he may need home oxygen therapy.      Bruce Rexene Edison Swords, MD  Electronically Signed     BHS/MEDQ  D:  06/29/2007  T:  06/29/2007  Job:  [id number]

## 2010-08-10 NOTE — Assessment & Plan Note (Signed)
[facility]                            CARDIOLOGY OFFICE NOTE   NAME:[name], [name]                          MRN:          [id number]  DATE:04/04/2008                            DOB:          [birth year]    PRIMARY CARE PHYSICIAN:  Valetta Mole. Swords, MD.   PERIPHERAL INTERVENTIONALIST:  [name]. Excell Seltzer, MD   CLINICAL HISTORY:  Mr. Ormiston is 70 year old and returned for followup  management of his coronary heart disease and peripheral vascular  disease.  He has had multiple percutaneous interventions, but has  nonobstructive disease at last catheterization and stable from the  standpoint of his heart with no chest pain.  He does get short of breath  with exertion, but this has not changed.   He also has peripheral vascular disease and he had stenting of the left  iliac for total occlusion with dramatic improvement in his Doppler  studies and symptoms.  He had a new external iliac lesion in January  2009.  He did well after that, but says over the last few weeks he has  developed bilateral pain in his buttocks and down his legs which is  worse with walking and worse with walking up hills.  He says this is  similar to what he had before.  Symptoms are similar in both sides.   Past medical history is significant for hyperlipidemia, hypertension,  history of amaurosis fugax, and GERD.   His current medications include:  1. Aspirin.  2. Crestor 10 mg daily.  3. Protonix.  4. Norvasc 10 mg daily.  5. Altace 10 mg daily.  6. Tenormin 50 mg daily.  7. Plavix 75 mg daily.  8. Boniva.  9. Vitamins.   On examination, the blood pressure was 129/79, the pulse 63 and regular.  There was no venous distension.  The carotid pulses were full without  bruits.  Chest was clear.  The cardiac rhythm was regular.  No murmurs  or gallops.  Abdomen was soft without organomegaly.  He could feel  femoral pulses on both sides, but the left felt decreased compared to  the  right.  I did not hear any bruits.  Pedal pulses were equal  bilaterally.  There was no peripheral edema.   IMPRESSION:  1. Coronary artery disease status post multiple percutaneous coronary      inventions with nonobstructive disease at last catheterization.  2. Peripheral vascular status post previous stenting of the totally      occluded iliac and status post stenting of an external iliac in      January 2009 with recurrent symptoms of questionable claudication.  3. Hypertension.  4. Hyperlipidemia.  5. History of amaurosis fugax.  6. Gastroesophageal reflux disease.   RECOMMENDATIONS:  Mr. Petties is having symptoms that could be claudication  too.  He is also having back pain and the symptoms are bilateral, so it  could be related to degenerative disk disease.  We will get Doppler  studies of his lower extremities.  If they are abnormal, we will have  him see Dr. Excell Seltzer  back.  If they are normal, then we will get x-rays of  his lumbar spine and decide the next approach.  He had a recent lipid  profile which is good except for an HDL of 37.  I will see him back for  cardiac followup in a year.   ADDENDUM  He is trying to quit smoking, and he has only smoked 7 cigarettes over  the first 8 days this year.  We encouraged him to continue to work on  that.     Bruce Elvera Lennox Juanda Chance, MD, [facility]  Electronically Signed    BRB/MedQ  DD: 04/04/2008  DT: 04/04/2008  Job #: [id number]

## 2010-08-10 NOTE — Letter (Signed)
January 15, 2007    Bruce H. Swords, MD  [address] [place], [place] [address]   RE:  [name], [name]  MRN:  [id number]  /  DOB:  06/20/1940   Dear Smitty Cords:   Thanks for asking me to see [name] for evaluation of his lower  extremity peripheral arterial disease.   As you know, [name] is a 70 year old gentleman who has long-standing  coronary disease and newly diagnosed peripheral arterial disease.  He  has a several month history of progressive leg pain.  He complained of  pain bilaterally, but his symptoms are much worse on the left.  He has  left buttock, thigh and calf pain with minimal activity.  His pain  rapidly resolves with rest.  He complains of pain in the right leg, as  well, but his right leg symptoms are much less severe than the left.  He  does complain of some night time symptoms in the left leg, but describes  this more as a tingling sensation.  He has nocturnal calf cramps, but  this feels different than his exertional leg pain.  He has not had any  ulcerations or skin breakdown on the feet.  He denies any history of  stroke or TIA.   Because of his leg symptoms, he was referred for ABIs, and they  demonstrated an ABI of 0.98 on the right and 0.46 on the left.  His  Doppler study demonstrated turbulent flow in the right external iliac  artery, but his waveforms were triphasic throughout.  On the left, there  is dampened flow throughout, suggesting severe inflow disease and  probable iliac occlusion on the left side.   CURRENT MEDICATIONS:  1. Calcium plus D 600 mg twice daily.  2. Aspirin 81 mg daily.  3. Vitamin E 400 International Units daily.  4. Norvasc 10 mg daily.  5. Protonix 40 mg daily.  6. Altace 10 mg daily.  7. Tenormin 50 mg 2 daily.  8. Plavix 75 mg daily.  9. Crestor 10 mg daily.  10.Boniva 150 mg monthly.   ALLERGIES:  TICLID.   PAST MEDICAL HISTORY:  1. Pertinent for coronary artery disease status post multiple      percutaneous  interventions.  2. History of amaurosis fugax.  3. History of lower GI bleeding secondary to diverticulitis and colon      polyps back in 2002.  4. Gastroesophageal reflux disease.  5. Essential hypertension.  6. Dyslipidemia.  7. Cholecystectomy.  8. Hemorrhoidectomy.  9. Appendectomy.  10.Thyroidectomy.   SOCIAL HISTORY:  The patient is married.  He lives locally.  He  continues to smoke cigarettes and has been a long-time smoker over the  last 50 years.  He drinks alcohol socially.   FAMILY HISTORY:  Father died at age 56 of a myocardial infarction.  He  has 2 brothers who died of heart disease, one at age 45, the other at  age 17.  His mother died at age 32 with congestive heart failure.   REVIEW OF SYSTEMS:  Intermittent rectal bleeding, as well as  gastroesophageal reflux and abdominal bloating.  All other systems were  reviewed and were negative, except as above.   PHYSICAL EXAMINATION:  GENERAL:  The patient is alert and oriented.  He  is in no acute distress.  VITAL SIGNS:  Weight is 157, blood pressure 134/76 in the right arm,  132/70 in the left arm, heart rate 64, respiratory rate 16.  HEENT:  Normal.  NECK:  Normal carotid upstrokes without bruits.  Jugular venous pressure  is normal.  No thyromegaly or nodules.  LUNGS:  Clear to auscultation bilaterally.  HEART:  Regular rate and rhythm without murmurs or gallops.  ABDOMEN:  Soft, obese, nontender.  No organomegaly.  BACK:  There is no CVA tenderness.  EXTREMITIES:  Right femoral pulse is 2+ with a femoral bruit present.  The left femoral pulse is trace, also with a bruit present.  Left pedal  pulses are not palpable on the right.  The posterior tibial and dorsalis  pedis pulses are 2+.  SKIN:  Warm and dry without rash.  LYMPHATICS:  No adenopathy.  NEUROLOGIC:  Cranial nerves II-XII are intact.  Strength is 5/5 and  equal bilaterally.   ASSESSMENT:  [name] is a 70 year old gentleman with peripheral   arterial disease and severe lifestyle limiting claudication.  His non-  invasive study and physical exam are suggestive of left iliac occlusion.  I have discussed potential treatment options.  I think the best course  is to perform angiography.  Depending on his anatomy, he may be a  candidate for endovascular treatment.  If he has a chronic occlusion, I  will likely make an attempt to cross the occlusion and treat the vessel,  unless there are angiographic features that are unfavorable for that  approach.  Will also be able to assess his right iliac artery and  determine whether he would benefit from percutaneous intervention of the  right iliac, as well.  I reviewed the risks and indications in detail,  and he is eager to proceed.  [name] was already on dual anti-platelet  therapy with aspirin and Plavix, and I have asked him to continue this.  He was scheduled to undergo angiography on January 17, 2007.   Bruce, thanks again for asking me to see [name].  Please feel free to  call at any time with question.  I will be in touch at the time of his  lower extremity angiogram.    Sincerely,      [name]. Excell Seltzer, MD  Electronically Signed    MDC/MedQ  DD: 01/15/2007  DT: 01/16/2007  Job #: [id number]   CC:    Bruce R. Juanda Chance, MD, [facility]

## 2010-08-10 NOTE — Assessment & Plan Note (Signed)
OFFICE VISIT   [name], RHINE  DOB:  [birth year]                                        October 27, 2009  CHART #:  [id number]   HISTORY:  The patient returned to my office today for a wound check  status post coronary bypass graft surgery x5 on October 12, 2009.  Prior to  discharge, he had some serosanguineous drainage from the lower end of  his chest incision and there was some concern whether he had partial  sternal dehiscence at the bottom of his incision.  We did a CT scan of  the chest, which showed that the sternum was intact and all the wires  were intact.  There was no fluid collection below the sternum.  He was  sent home on Augmentin for another 9 or 10 days and has been doing well.  He denies any fever or chills.  He has had minimal discomfort.  He has  been walking daily around the house and is still using oxygen  periodically at 2 L nasal cannula.   PHYSICAL EXAMINATION:  Blood pressure 110/69, pulse 88 and regular, and  respiratory rate is 20 and unlabored.  Oxygen saturation is 96% on 2 L.  He looks well.  Lung exam reveals decreased breath sounds throughout  consistent with COPD.  The heart shows regular rate and rhythm with  normal heart sounds.  Chest incision is intact without drainage.  There  is no erythema or tenderness.  The sternum feels grossly stable.  His  right leg incision is healing well.  There is no peripheral edema.   IMPRESSION:  The patient is doing well following surgery.  His chest  incision looks stable without drainage or sign of infection.  He will  continue his antibiotic until it is completed.  We will have him return  to our office in about 3 weeks with a chest x-ray.  I asked him to call  our office immediately if he develops any redness or drainage from his  chest incision.   Evelene Croon, M.D.  Electronically Signed   BB/MEDQ  D:  10/27/2009  T:  10/27/2009  Job:  [id number]

## 2010-08-12 ENCOUNTER — Ambulatory Visit (INDEPENDENT_AMBULATORY_CARE_PROVIDER_SITE_OTHER): Payer: Medicare Other | Admitting: Adult Health

## 2010-08-12 ENCOUNTER — Encounter: Payer: Self-pay | Admitting: Adult Health

## 2010-08-12 DIAGNOSIS — J449 Chronic obstructive pulmonary disease, unspecified: Secondary | ICD-10-CM

## 2010-08-12 MED ORDER — DOXYCYCLINE HYCLATE 100 MG PO TABS: 100.0000 mg | ORAL_TABLET | Freq: Two times a day (BID) | ORAL | Status: AC

## 2010-08-12 NOTE — Assessment & Plan Note (Addendum)
Mild flare with rhinitis Patient's medications were reviewed today and patient education was given. Computerized medication calendar was adjusted/completed  Plan Doxycycline 100mg  Twice daily  For 7 days  Mucinex DM Twice daily  As needed  Cough/congestion  Add Zyrtec 10mg  daily  Fluids and rest  Follow med calendar closely and bring  To each visit.  follow up Dr. Sherene Sires  In 3 months and As needed   Please contact office for sooner follow up if symptoms do not improve or worsen or seek emergency care

## 2010-08-12 NOTE — Progress Notes (Signed)
Subjective:    Patient ID: [name], male    DOB: 1940-07-19, 70 y.o.   MRN: [id number]  HPI    52 yowm quit smoking July 2011 at CABG and did fine in terms of improved ex tol > walk easily on [place].   February 16, 2010 1st pulmonary office eval   cc sob x 3 weeks first noticed walking up incline then cough onset x 2 weeks with flu like symptoms with white mucus mucus then turned bloody x 24 hours with maybe 2 tbs of blood, no purulent or putrid sputum, with cxr 11/21 at [facility] suggesting pna in lingula, new since 11/3 so started on amox and referred to pulmonary clinic. has mild sore throat, no choking or obvious asp, had dental work done about 2 weeks before onset of symptoms.  He has had intermittent Left sided CP x years post on R intermittent related to rib fx from coughing. No recent change. rec ct neg for pulmonary emboli or ca. Stop altace  Stop aspirin until no bleeding for 3 days  Stop amoxiicillin and taek augmentin 875 with bfeast and supper and yogurt for lunch  Please schedule a follow-up appointment in 10 days, sooner if needed with cxr on return  Add pepcid 20 mg one at bedtime  GERD (REFLUX) diet  Late Add: pft's c/w copd /ab and cxr's inconclusiive, so CTangiogram done and ok  rx  symbicort 160 2 puffs first thing in am and 2 puffs again in pm about 12 hours   February 25, 2010 ov cough better, hasn't tried much outdoor activity yet, no more hemoptysis. >>d/c ace/norvasc, rx bystolic. tx w/ PPI/pepcid , xray showed Persistent patchy and nodular airspace disease in the left lung.  March 11, 2010--Presents for follow up and med review. He has brought all his meds in today. He is taking symbicort only once daily (not two times a day ) and has not started on pepcid at bedtime. Last ov changed off ace /norvasc now on bystolic. Cough is better w/ no further hemoptysis but not totally gone. Xray last ov showed persistent patchy and nodular airspace disease in the  left lung. I will call with chest xray results.  Follow med calenadar closley and bring to each visit.  add pepcid 20mg  at bedtime  Increase Symbicort 2 puffs two times a day    April 23, 2010 ov cough and sob no better and throat very sore, no better p rx with rx as uri with doxy. mild dysphagia, mod hoarseness rec .stop symbicort  Try xopenex in place of ventolin  Dulera 100 2 bid Work on inhaler technique  See calendar for specific medication instructions > lost it   07/29/2010 ov/Wert cc doe not better, no where near baseline  not able to tolerate dulera due to mouth irritation, did not try spacer as rec or arm and hammer toothpaste as rec. No purulent sputum. Sleeping ok without nocturnal  or early am exac of resp c/o's or need for noct saba. >>Symbicort decreased 80, zyrtec prn   08/12/10 Follow up and med review Presents for follow up and med review.  He has brought all his meds , we organized his meds into a med calendar with pt education  It appears he is taking correctly.   Hoarseness is some better on lower dose of symbicort. Has has had more cough/congestion with green mucus and more throat drainage.  Has a lot of allergy like symptoms with nasal  drip. No fever or chest pain or edema.      Past Medical History: Allergic rhinitis  Osteoporosis  1. Coronary artery disease status post multiple percutaneous coronary  inventions > CABG 09/2009  2. Peripheral vascular status post previous stenting of the totally  occluded iliac and status post stenting of an external iliac in  January 2009 with recurrent symptoms of questionable claudication.  3. Hypertension.  4. Hyperlipidemia.  5. History of amaurosis fugax 1998.  6. Gastroesophageal reflux disease.  anemia, B12 deficiency  AFIB--post CABG  Atrial fibrillation  COPD  - PFT's 01/28/10 FEV1 1.16 46% ratio 44 with 27% improvement p B2, DLCO 76%  - HFA 50% p coaching February 25, 2010 > 75% April 23, 2010 > 75%     Hemoptysis onset 01/2010  - CT chest 02/19/10 : no PE, no ca/ nodular infiltrates > f/u 03/11/10 cxr no nodules  COMPLEX MED REGIMEN Meds reviewed with pt education and computerized  med calendar completed March 11, 2010  08/12/10    Review of Systems Constitutional:   No  weight loss, night sweats,  Fevers, chills, fatigue, or  lassitude.  HEENT:   No headaches,  Difficulty swallowing,  Tooth/dental problems, or  Sore throat,               + sneezing, itching, ear ache, nasal congestion, post nasal drip,   CV:  No chest pain,  Orthopnea, PND, swelling in lower extremities, anasarca, dizziness, palpitations, syncope.   GI  No heartburn, indigestion, abdominal pain, nausea, vomiting, diarrhea, change in bowel habits, loss of appetite, bloody stools.   Resp:  No excess mucus, no productive cough,  No non-productive cough,  No coughing up of blood.   .  No wheezing.  No chest wall deformity  Skin: no rash or lesions.  GU: no dysuria, change in color of urine, no urgency or frequency.  No flank pain, no hematuria   MS:  No joint pain or swelling.  No decreased range of motion.     Psych:  No change in mood or affect. No depression or anxiety.             Objective:   Physical Exam GEN: A/Ox3; pleasant , NAD, well nourished   HEENT:  [place]/AT,  EACs-clear, TMs-wnl, NOSE-clear drainage THROAT-clear, no lesions, no postnasal drip or exudate noted.   NECK:  Supple w/ fair ROM; no JVD; normal carotid impulses w/o bruits; no thyromegaly or nodules palpated; no lymphadenopathy.  RESP  Clear  P & A; w/o, wheezes/ rales/ or rhonchi.no accessory muscle use, no dullness to percussion  CARD:  RRR, no m/r/g  , no peripheral edema, pulses intact, no cyanosis or clubbing.  GI:   Soft & nt; nml bowel sounds; no organomegaly or masses detected.  Musco: Warm bil, no deformities or joint swelling noted.   Neuro: alert, no focal deficits noted.    Skin: Warm, no lesions or rashes        Assessment & Plan:

## 2010-08-12 NOTE — Patient Instructions (Signed)
Doxycycline 100mg  Twice daily  For 7 days  Mucinex DM Twice daily  As needed  Cough/congestion  Add Zyrtec 10mg  daily  Fluids and rest  Follow med calendar closely and bring  To each visit.  follow up Dr. Sherene Sires  In 3 months and As needed   Please contact office for sooner follow up if symptoms do not improve or worsen or seek emergency care

## 2010-08-13 NOTE — Discharge Summary (Signed)
NAME:  [name], [name] NO.:  [id number]   MEDICAL RECORD NO.:  [id number]                   PATIENT TYPE:  INP   LOCATION:  4727                                 FACILITY:  [facility]   PHYSICIAN:  Carole Binning, M.D. [facility]         DATE OF BIRTH:  Jun 08, 1940   DATE OF ADMISSION:  12/24/2002  DATE OF DISCHARGE:                           DISCHARGE SUMMARY - REFERRING   SUMMARY OF HISTORY:  [name] is a 69 year old white male who, after  exerting himself on the day of admission, felt very fatigued and some  shortness of breath.  He also developed discomfort between his scapula and  to his left shoulder which he felt was his anginal symptoms.  He took a  nitroglycerin which eased the discomfort.  However, the discomfort returned  later that morning and then resolved by 2:30 p.m.  It again reoccurred at  approximately 3 p.m. and resolved with two nitroglycerin; thus his  presentation.   His history is notable for prior stenting to the LAD and to the distal RCA.  He also has a history of hyperlipidemia, hypertension, lower GI bleed, COPD,  osteoporosis, continued tobacco use.   LABORATORY DATA:  Admission sodium was 141, potassium 4.1, BUN 10,  creatinine 0.9, glucose 81.  Normal LFTs.  CK total and MBs were negative  for myocardial infarction.  PT 12.9, PTT 41.  H&H was 14.0 and 40.5, normal  indices, platelets 225, wbc's 4.9.  Chest x-ray showed cardiomegaly, no  active disease.  EKG showed normal sinus rhythm, normal axis, nonspecific ST-  T wave changes.  It was noted there was some slight J point elevation  diffusely, probable early repolarization.   HOSPITAL COURSE:  [name] was admitted to 4700 and placed on heparin and  his home medications.  Overnight he complained of stomach ache and cramping  and felt like he was constipated.  However, he had not had any further  discomfort that prompted his presentation to the hospital.  He had ruled out  for  myocardial infarction.  However, Dr. Andee Lineman, with his continued risk  factors, felt the patient should undergo cardiac catheterization.  On  September 29 Dr. Gerri Spore performed cardiac catheterization.  This showed  an EF of 63%, intact renal arteries, mild diffuse bilateral iliac disease.  He had a 40% left main.  The stented LAD was patent.  He had a small ramus  with a 70% proximal lesion and a 70-80% proximal circumflex lesion.  The  stent to the mid RCA was patent.  He had some mild 20% lesion throughout the  RCA.  Dr. Gerri Spore felt that he had moderate disease in the ramus and the  circumflex which was present on previous catheterization.  He felt that  there may have been some progression.  Due to this he ordered an adenosine  Cardiolite.  If there was lateral ischemia he felt that  we should intervene  on those lesions.  The adenosine Cardiolite was scheduled for 9:30 a.m.  This was performed without difficulty.  Imaging showed an EF of 59%, small  anteroseptal scar, no ischemia.  After review with Dr. Gerri Spore it was felt  that he could be discharged home.   DISCHARGE DIAGNOSES:  1. Chest discomfort of undetermined etiology.  Slight progression of     coronary artery disease as depicted by cardiac catheterization but     Cardiolite is negative for ischemia.  2. Hypertension.  3. Continued tobacco use.   DISPOSITION:  Dr. Gerri Spore felt that he should be maintained on Plavix 75  mg daily for one month.   He was asked to continue his home medications which include:  1. Aspirin 81 daily.  2. Norvasc possibly 10 mg daily.  3. Protonix 40 mg daily.  4. Imdur 60 mg one-and-a-half tablets daily.  5. Pravachol 40 q.h.s.  6. Altace 5 daily.  7. Tenormin 50 q.p.m., 100 q.a.m.  8. Fosamax 70 mg weekly.  9. Calcium and vitamin E.  10.      Sublingual nitroglycerin as needed.   He was advised no lifting, driving, sexual activity, or heavy exertion for  two days.  Maintain low  salt/fat/cholesterol diet.  If he had any problems  with the catheterization site he was asked to call us immediately.  He was  advised no smoking or tobacco products, and he will follow up with Dr.  Juanda Chance on January 17, 2003 at 3:30 p.m.  Prior to discharge he received a  smoking consultation and on review with Dr. Juanda Chance cardiac risk factor  modifications should be reevaluated and reinforced.      Joellyn Rued, P.A. [facility]                    Carole Binning, M.D. [facility]    EW/MEDQ  D:  12/26/2002  T:  12/26/2002  Job:  [id number]   cc:   Valetta Mole. Swords, M.D. [facility]   Charlies Constable, M.D.

## 2010-08-13 NOTE — H&P (Signed)
NAME:  [name], [name] NO.:  [id number]   MEDICAL RECORD NO.:  [id number]                   PATIENT TYPE:  INP   LOCATION:  1828                                 FACILITY:  [facility]   PHYSICIAN:  Learta Codding, M.D.                 DATE OF BIRTH:  22-Jan-1941   DATE OF ADMISSION:  12/24/2002  DATE OF DISCHARGE:                                HISTORY & PHYSICAL   CHIEF COMPLAINT:  Substernal chest pain in a patient with known coronary  artery disease.   HISTORY OF PRESENT ILLNESS:  The patient is a 70 year old male with a  history of coronary artery disease, status post multiple prior  interventions.  The patient developed acute onset of shortness of breath and  substernal chest pain today during activity.  He reported pain radiating to  the left shoulder which is usually associated with angina.  He took one  nitroglycerin which subsequently improved his symptoms.  He then went to the  fire department where he received oxygen and his pain was fully relieved.  He reports the pain occurred approximately around 10 o'clock this morning  and was initially 7/10.  It took several hours, however, to resolve.  The  pain was finally resolved by 2:30.  He came to the emergency room where he  developed progressive substernal chest pain which resolved with two  nitroglycerins.  EKG at this point in time did not show any acute ischemic  changes.  After he was given nitroglycerin, he developed yet again, another  episode of substernal chest pain which was promptly relieved by intravenous  nitroglycerin.   ALLERGIES:  TICLID.   MEDICATIONS:  1. Aspirin 81 mg a day.  2. Norvasc 7 mg a day.  3. Protonix 40 mg a day.  4. IMDUR 60 mg 1-1/2 tablets p.o. daily.  5. Pravachol 40 mg p.o. daily.  6. Altace 5 mg p.o. daily.  7. Tenormin 50 mg two tablets in the morning and one in the p.m.  8. Fosamax 70 mg p.o. q.week.  9. Calcium and vitamin D as well as vitamin E.  10.       Coumadin discontinued in 2004.   PAST MEDICAL HISTORY:  Multiple prior coronary interventions, please see  written P.A. note.  Last catheterization was in 2002, PTCA stent to the LAD,  prior in-stent restenosis of an RCA stent, status post distal RCA stent and  PTCA of the LAD in 1990 and 1996.  History of lower GI bleeding.  History of  Coumadin use for amaurosis fugax, but no history of atrial fibrillation and  normal LV function.  Gastroesophageal reflux disease.  Hemorrhoids.  History  of partial thyroidectomy.  History of cholecystectomy.  History of  hemorrhoidectomy and appendectomy.   SOCIAL HISTORY:  The patient lives in [place] with his wife.  He is  retired and currently  does not smoke or drink alcohol.  He has a 40-pack-  year tobacco history.   FAMILY HISTORY:  Mother died at age 57 of myocardial infarction.  Father  died of an MI at age 38.  He also has two brothers who all have coronary  artery disease.   REVIEW OF SYSTEMS:  No fever or chills.  No headache or sore throat.  Positive for chest pain, dyspnea on exertion, orthopnea, and PND. No  palpitations or syncope.  No frequency or dysuria.  Positive for  arthralgias.  Diarrhea x2 weeks.  GERD symptoms.  No polyuria or polydipsia.   PHYSICAL EXAMINATION:  VITAL SIGNS: Blood pressure 134/82, heart rate 70  beats per minute, respirations 18, temperature 97.4.  GENERAL:  Elderly white male in no apparent distress.  HEENT:  NCAT, PERRLA, EOMI.  NECK:  Supple.  No bruit or JVD.  HEART:  Regular rate and rhythm.  Normal S1 and S2. No murmurs, rubs, or  gallops.  LUNGS:  Clear.  SKIN:  No rash or lesions.  ABDOMEN:  Soft and nontender.  No rebound or guarding.  Good bowel sounds.  GENITOURINARY:  RECTAL: Deferred.  EXTREMITIES:  No cyanosis, clubbing, or edema.  MUSCULOSKELETAL:  No joint deformities or effusion.  NEUROLOGY:  The patient is alert, oriented, and grossly nonfocal.   Chest x-ray; pending.  EKG;  normal sinus rhythm, heart rate 74 beats per  minute, no acute ischemic changes.   LABORATORY DATA:  Currently pending.   IMPRESSION:  1. Substernal chest pain.  The patient has a known history of coronary     artery disease and multiple prior coronary interventions.  Chest pain is     rather typical and resolved in the emergency room with intravenous     nitroglycerin.  The patient will proceed with repeat cardiac     catheterization scheduled in the morning.  If he has recurrent substernal     chest pain, consideration of a 2B3A inhibitor should be considered,     although, we need to be careful given his prior history of     gastrointestinal bleeding.  I will start him on Plavix tonight in     anticipation for a possible future procedure.  2. History of heme positive stools.  3. History of prior anticoagulation secondary to amaurosis fugax, now off     Coumadin.   DISPOSITION:  Will plan cardiac catheterization in the morning.                                                Learta Codding, M.D.    GED/MEDQ  D:  12/24/2002  T:  12/24/2002  Job:  [id number]   cc:   Valetta Mole. Swords, M.D. [facility]   Stacie Glaze, M.D. [facility]   Charlies Constable, M.D.

## 2010-08-13 NOTE — Assessment & Plan Note (Signed)
[facility]                            CARDIOLOGY OFFICE NOTE   NAME:[name], [name]                          MRN:          [id number]  DATE:02/23/2006                            DOB:          [birth year]    PRIMARY CARE PHYSICIAN:  Dr. Birdie Sons.   CLINICAL HISTORY:  Mr. Barreira is 70 years old and has documented coronary  disease with multiple percutaneous coronary interventions.  He has had a  previous diaphragmatic wall infarction.  His last catheterization showed  nonobstructive disease.   He has been doing quite well recently.  He has had no recent chest pain,  shortness of breath, or palpitations.  His main symptoms are related to  back pain which radiates down to both his legs for which he has been  managed by Dr. Cato Mulligan.   PAST MEDICAL HISTORY:  Significant for hypertension, hyperlipidemia,  GERD.  He also has a history of amaurosis fugax.   CURRENT MEDICATIONS:  Include:  1. Aspirin.  2. Norvasc.  3. Protonix.  4. Imdur.  5. Altace.  6. Tenormin.  7. Fosamax.  8. Plavix.  9. Crestor.  10.Flomax.   EXAMINATION:  The blood pressure was 160/96 and a pulse 62 and regular  (he says his blood pressure has been in the range of 130/80 on all of  his pervious checks with Dr. Cato Mulligan, urologist, and [facility]).  There was no jugular venous distention.  The carotid pulses were full  and I could hear no bruits.  CHEST:  Clear without rales or rhonchi.  CARDIAC:  Rhythm was regular.  There were no murmurs or gallops.  ABDOMEN:  Soft without organomegaly.  The femoral pulses were equal without bruits.  The right pedal pulse was  full.  The left pedal pulse, I think, was probably okay.   An electrocardiogram was normal.   IMPRESSION:  1. Coronary artery disease status post prior diaphragmatic wall      infarction and multiple percutaneous interventions with      nonobstructive disease at last catheterization, now stable.  2. Hypertension, elevated  today, but normal on previous readings.  3. Hyperlipidemia.  4. History of amaurosis fugax.  5. Gastroesophageal reflux disease.   RECOMMENDATIONS:  I think Easter is doing well.  His last lipid profile  was not at target but he has had difficulty tolerating medication, so I  think we will leave him where he is.  He also indicated his blood  pressure readings have been normal on other occasions, so I do not think  we will adjust that today either.  I will plan to see him back in  followup in 1 year.     Bruce Elvera Lennox Juanda Chance, MD, [facility]  Electronically Signed    BRB/MedQ  DD: 02/23/2006  DT: 02/23/2006  Job #: [id number]

## 2010-08-13 NOTE — Discharge Summary (Signed)
[name], Jeffery NO.:  [id number]   MEDICAL RECORD NO.:  [id number]          PATIENT TYPE:  INP   LOCATION:  1830                         FACILITY:  [facility]   PHYSICIAN:  [name], M.D. LHCDATE OF BIRTH:  [birth year]   DATE OF ADMISSION:  05/12/2004  DATE OF DISCHARGE:  05/12/2004                           DISCHARGE SUMMARY - REFERRING   PROCEDURE:  Cardiac catheterization May 12, 2004.   REASON FOR ADMISSION:  [name] is a 70 year old male with known coronary  artery disease status post remote myocardial infarction and multiple  percutaneous interventions, followed by Dr. Charlies Newman, who presented to  the emergency room with symptoms worrisome for unstable angina pectoris.  Please refer to dictated admission note for full details.   LABORATORY DATA:  Normal cardiac markers (POC), normal CBC, electrolytes,  and renal function.  Heme positive stool.  Admission chest x-ray shows no  acute disease.   HOSPITAL COURSE:  At the time of presentation to the emergency room, the  patient was pain free after being treated with nitroglycerin in the field.  Heparin was initiated in the emergency room but subsequently discontinued  given to finding of trace flecks of blood in his stool and subsequent guaiac  positive specimen.  The patient does report a history of lower GI bleed,  hemorrhoids, and is followed with Dr. Lina Newman.  Given the patient's  known history of coronary artery disease and last cardiac catheterization in  2004 revealing no critical disease followed by a nonischemic Cardiolite, the  recommendation was to proceed directly with cardiac catheterization.  The  patient was agreeable with this plan.  Coronary angiography, however,  revealed no significant change in coronary anatomy compared to previous  study of September 2004.  Dr. Samule Newman, therefore, concluded that the  patient's symptoms were noncardiac in etiology.  No further  recommendations  were made and the plan was to keep the patient for overnight observation.  The plan is to discharge the following morning on previous home medication  regimen.   DISCHARGE MEDICATIONS:  Tenormin 100 mg q.a.m., 15 mg q.p.m., Plavix 75 mg  daily, coated aspirin 81 mg daily, Protonix 40 mg daily, Norvasc 10 mg  daily, Crestor 10 mg daily, Fosamax 70 mg each week, Imdur 60 mg daily,  Nitrostat 0.4 mg as directed.   DISCHARGE INSTRUCTIONS:  No strenuous activities, heavy lifting, or driving  x 2 days.  Maintain low fat/cholesterol diet.  Call the cardiology for any  swelling or bleeding of the groin.  The patient is instructed to maintain  previously scheduled follow up visits with Drs. [name] and [name].   DISCHARGE DIAGNOSIS:  1.  Noncardiac chest pain.      1.  Normal cardiac markers.      2.  Nonobstructive coronary artery disease with patent stents by same          day cardiac catheterization.      3.  Status post remote myocardial infarction, multiple percutaneous          interventions with stenting of  the left anterior descending  and          right coronary artery.      4.  Normal left ventricular function.  2.  Heme positive stool.      1.  Normal hemoglobin.      2.  History of lower gastrointestinal bleed secondary to hemorrhoids.  3.  Tobacco ongoing.  4.  Dyslipidemia.  5.  Hypertension.  6.  Gastroesophageal reflux disease.  7.  Remote amaurosis fugax.      1.  Treated with Coumadin.      GS/MEDQ  D:  05/12/2004  T:  05/12/2004  Job:  [id number]

## 2010-08-13 NOTE — Cardiovascular Report (Signed)
[place]  Patient:    [name], [name] Visit Number: [id number] MRN: [id number]          Service Type: MED Location: 2000 2013 01 Attending Physician:  Lenoria Farrier Dictated by:   Jonelle Sidle, M.D. [facility] Proc. Date: 03/08/01 Admit Date:  03/06/2001   CC:         Bruce H. Swords, M.D. [facility]  Bruce R. Juanda Chance, M.D. [facility]   Cardiac Catheterization  DATE OF BIRTH:  10-09-1940.  REFERRING PHYSICIAN:  Valetta Mole. Swords, M.D.  PRIMARY [place] CARDIOLOGIST:  Everardo Beals. Juanda Chance, M.D.  INDICATIONS:  The patient is a 70 year old male with a history of hypertension, dyslipidemia, lower gastrointestinal bleed, TIA, and known coronary artery disease status post percutaneous coronary intervention to an in-stent restenosis in the RCA back in January of this year.  The patient presents now with unstable angina and is referred for cardiac catheterization to evaluate the coronary anatomy.  PROCEDURES PERFORMED: 1. Left heart catheterization. 2. Left ventriculography. 3. Selective coronary angiography.  ACCESS AND EQUIPMENT:  The right femoral artery was used for access.  Standard preformed JL4 and JR4 catheters were used for selective coronary angiography. An angled pigtail catheter was used for left ventriculography.  COMPLICATIONS:  None.  HEMODYNAMICS:  Left ventricle 144/25 mmHg (post angiography).  Aorta  143/77 mmHg.  ANGIOGRAPHIC FINDINGS: 1. The left main coronary artery has a 20% distal stenosis. 2. The left anterior descending is a small to medium caliber vessel with a 90%    mid vessel stenosis.  There is 30-40% diffuse stenosis in the distal    vessel.  There is essentially one diagonal branch and three to four septal    branches. 3. The circumflex coronary artery gives rise to two obtuse marginal branches.    There is also a very small ramus intermedius branch.  There is a 30%    proximal circumflex stenosis.  The ramus  intermedius branch has a 70%    stenosis and the first obtuse marginal branch has a 60% stenosis. 4. The right coronary artery is a medium caliber vessel.  There is 20%    in-stent restenosis at the mid vessel level.  There is approximately 20-30%    stenosis of the distal vessel that also appears to involve a prior stent    site.  There is a very small posterolateral branch and a small posterior    descending branch.  There is approximately 30-40% stenosis of the    posterolateral system.  Left ventriculography reveals normal left ventricular contraction with an estimated ejection fraction of 60%.  Trace mitral regurgitation is noted with ectopy.  DIAGNOSES: 1. Mild to moderate coronary atherosclerosis as outlined above with a new 90%    mid LAD stenosis that was not present on prior coronary angiography in    January of this year.  The ramus intermedius and proximal obtuse marginal    system appears similar to prior films from January.  The prior mid vessel    stent site in the RCA has approximately 20% in-stent restenosis with no    obstructive lesions noted. 2. Normal left ventricular contraction with an estimated ejection fraction of    60% and trace mitral regurgitation with ectopy.  RECOMMENDATIONS:  Will discuss films with Dr. Chales Abrahams and assess for percutaneous coronary intervention of the LAD stenosis.Dictated by:   Jonelle Sidle, M.D. [facility] Attending Physician:  Lenoria Farrier DD:  03/08/01 TD:  03/08/01 Job: [id number]

## 2010-08-13 NOTE — Cardiovascular Report (Signed)
[place]  Patient:    [name], [name] Visit Number: [id number] MRN: [id number]          Service Type: MED Location: 3700 3711 01 Attending Physician:  Lenoria Farrier Dictated by:   Veneda Melter, M.D. Proc. Date: 03/08/01 Admit Date:  03/06/2001   CC:         Bruce H. Swords, M.D. [facility]  Lewayne Bunting, M.D. [facility]   Cardiac Catheterization  PROCEDURE: 1. Coronary angiography. 2. Percutaneous transluminal coronary angioplasty and stenting of the    proximal left anterior descending artery.  DIAGNOSES: 1. Coronary artery disease. 2. Unstable angina. 3. Normal left ventricular systolic function.  CARDIOLOGIST:  Veneda Melter, M.D.  HISTORY:  [name] is a 70 year old white male with a known history of coronary artery disease who has previously undergone percutaneous intervention.  The patient was admitted to the hospital with unstable angina and underwent cardiac catheterization by Dr. Diona Browner earlier today showing a severe narrowing of 90% in the proximal LAD prior to a first diagonal branch. He presents now for percutaneous treatment.  TECHNIQUE:  After informed consent was obtained, the patient was brought to the catheterization lab.  The existing 6-French sheath was exchanged over wire using sterile technique for another 6-French sheath.  The patient was then given 300 mg of Plavix orally, heparin and ReoPro on a weight-adjusted basis to maintain ACT greater than 225 seconds.  A 6-French CBU4 guide catheter was then used to engage the left coronary artery.  Selective guide shots were obtained using manual injection of contrast.  This confirmed the presence of a high-grade eccentric lesion in the mid section of the vessel just proximal to a medium-size first diagonal branch.  The remainder of the LAD had mild diffuse disease of 30%.  A 0.014 inch extra support wire was advanced into the distal LAD and a 2.5 x 8 mm Quantum Maverick  balloon introduced.  This was used to predilate the lesion at 6 atmospheres for 30 seconds.  Repeat angiography showed improvement in vessel lumen with approximately 30 to 40% residual narrowing. A 2.75 x 8 mm Express 2 stent was then introduced and carefully positioned in the proximal LAD, extending up to the bifurcation with the first diagonal branch.  This was deployed at 16 atmospheres for 60 seconds.  Repeat angiography was then performed after the administration of intracoronary nitroglycerin showing an excellent result with full coverage of the lesion. No residual stenosis noted and some vessel damage.  There was TIMI-3 flow through the LAD.  Final angiography was performed in various projections confirming these findings.  The guide catheter was then removed, the sheath secured in position.  The patient had chest discomfort with balloon inflations which was relieved by the end of the case with balloon deflation and nitroglycerin.  He was transferred to the holding area in stable condition. The sheath will be removed when the ACT returns to normal.  FINAL RESULTS:  Successful PTCA and stenting of the proximal LAD with reduction of 90% narrowing to 0% with placement of a 2.75 x 8 mm Express 2 stent. Dictated by:   Veneda Melter, M.D. Attending Physician:  Lenoria Farrier DD:  03/08/01 TD:  03/08/01 Job: [id number]

## 2010-08-13 NOTE — H&P (Signed)
[name], [name] NO.:  [id number]   MEDICAL RECORD NO.:  [id number]          PATIENT TYPE:  INP   LOCATION:  1830                         FACILITY:  [facility]   PHYSICIAN:  Salvadore Farber, M.D. LHCDATE OF BIRTH:  Apr 25, 1940   DATE OF ADMISSION:  05/12/2004  DATE OF DISCHARGE:                                HISTORY & PHYSICAL   REASON FOR ADMISSION:  [name] is a 70 year old male with known coronary  artery disease followed by Dr. Charlies Constable, who now presents to the  emergency room  with new onset, progressive chest pain, relieved with  nitroglycerin.  The patient's cardiac history is notable for previous  inferior MI in 1996 treated with stenting (Palmaz-Shatz) of the right  coronary artery.  This was then followed by PTCA of the left anterior  descending  in 1996, stenting of the distal right coronary artery in 2000,  and stenting of the mid right coronary artery secondary to in stent  restenosis in January 2002.  His last cardiac catheterization in December  2002 revealed widely patent LAD and RCA stents with moderate ramus  intermedius and mid left circumflex disease with, perhaps, only mild  progression of this disease as compared to previous studies.  Following this  catheterization, the patient was referred for a stress Cardiolite which was  negative for ischemia with question of a tiny infarction in the mid  anteroposterior septal wall, EF 59%.  The patient now presents with an  approximately one month history of new onset chest discomfort.  He reports  having experienced only left scapular pain with radiation to the left arm  with his past presentations.  He also noted some exertional dyspnea over  these past two weeks, as well.  This morning, at approximately 7:30, while  sitting at the breakfast table and reading his newspaper, he developed  severe (8/10) sharp mid sternal pain associated with diaphoresis, dyspnea,  and nausea but no vomiting.   Although he reported no radiation to the upper  extremities, he did report feeling pain in his teeth on the left side.  He  took one nitroglycerin at home, with no significant affect, and then EMS was  activated.  He was treated with two additional nitroglycerin tablets with  significant relief such that he was pain free on arrival.  He also received  four baby aspirin and 25 of Lopressor.  On admission, electrocardiogram  revealed no acute changes.  Initial cardiac markers are normal.   ALLERGIES:  Ticlid.   MEDICATIONS PRIOR TO ADMISSION:  Tenormin 100 q.a.m., 50 q.p.m., Plavix 75  daily, aspirin 81 daily, Protonix 40 daily, Norvasc 10 daily, Crestor 10  daily, Fosamax 70 each week, Imdur 60 daily, Nitrostat p.r.n.   PAST MEDICAL HISTORY:  Coronary artery disease (as described above), history  of amaurosis fugax treated with Coumadin approximately four years ago,  history of lower GI bleed secondary to diverticulitis/colon polyps in 2002,  gastroesophageal reflux disease, hypertension, and dyslipidemia.  Status  post cholecystectomy, hemorrhoidectomy, appendectomy, and thyroidectomy.   SOCIAL HISTORY:  The patient  continues to smoke anywhere from 1-2 packs  cigarettes a day and has been smoking for at least 50 years.  He has several  mixed drinks several times a week, as well as an occasional beer.  Mother  deceased at age 16 with history of heart failure.  Father deceased at age 32  secondary to fatal myocardial infarction (first).  Two brothers deceased  (age 106, age 78) with history of heart failure.   REVIEW OF SYMPTOMS:  As noted per HPI.  Also reports intermittent rectal  spotting but no hemoptysis, hematuria, or melena.  He has also been having  difficulty with heartburn and bloating.  The remaining systems are negative.   PHYSICAL EXAMINATION:  VITAL SIGNS:  Blood pressure 95/64 initially, follow up 124/73, pulse 73 and  regular, respirations 22, temperature 97.9,  saturations 94 on room air.  GENERAL:  70 year old male in no apparent distress.  HEENT:  Normocephalic, atraumatic.  NECK:  Carotid pulses without bruits.  LUNGS:  Clear to auscultation in all fields.  HEART:  Regular rate and rhythm (S1 and S2), no murmurs, gallops, and rubs.  ABDOMEN:  Soft, nontender with intact bowel wounds.  EXTREMITIES:  Bilateral femoral pulses without bruits, intact distal pulses  without edema.  NEUROLOGICAL:  No focal deficit.   LABORATORY DATA:  Chest x-ray pending.  Electrocardiogram normal sinus  rhythm at 66 with normal axis, Q waves in v1 and v2, slight ST segment in  the inferior leads, no acute changes.  MB less than 1 (2), troponin less  than 0.05 (2).  Hemoglobin 13, hematocrit 39, WBC 4, platelets 219.   IMPRESSION:  1.  Unstable angina pectoris.  2.  Coronary artery disease.      1.  Status post remote inferior myocardial infarction/multiple          percutaneous interventions.      2.  Widely patent LAD and RCA stents with moderate ramus and left          circumflex disease by cardiac catheterization September 2004.      3.  Normal left ventricular function.  3.  History of lower GI bleed.      1.  Secondary to diverticulosis.      2.  Heme positive stool.  4.  Continued tobacco.  5.  Dyslipidemia.  6.  Hypertension.  7.  Gastroesophageal reflux disease.   PLAN:  Recommendation is to proceed with diagnostic cardiac catheterization  and possible percutaneous intervention.  The patient is agreeable to this  plan.  The risks and benefits have been discussed.  Of note, the patient did  test positive for heme in his stool.  Therefore, the intravenous heparin  which had been initiated in the emergency room  will be discontinued, given  that the patient currently has no abnormal cardiac markers and no acute  changes on electrocardiogram.  We will, otherwise, continue his current home medications and also start IV nitroglycerin.  Consideration  will also be  given to consulting Dr. Lina Sar, who is the patient's primary  gastroenterologist, regarding his recurrent heme positive stool.      GS/MEDQ  D:  05/12/2004  T:  05/12/2004  Job:  [id number]   cc:   Valetta Mole. Swords, M.D. [facility]   Lina Sar, M.D. [facility]

## 2010-08-13 NOTE — Discharge Summary (Signed)
[place]. [facility]  Patient:    [name], [name]                          MRN: [id number] Adm. Date:  [id number] Disc. Date: [id number] Attending:  Lenoria Farrier Dictator:   Tereso Newcomer, P.A. CC:         Dr. Lovell Sheehan   Discharge Summary  DATE OF BIRTH:  [birth year]  DISCHARGE DIAGNOSES: 1. Coronary artery disease. 2. History of myocardial infarction in 1997. 3. Hypertension. 4. Hyperlipidemia. 5. Tobacco use. 6. Osteoporosis. 7. History of amaurosis fugax. 8. Chronic anticoagulation. 9. History of multiple cardiac catheterizations.  PROCEDURES:  Cardiac catheterization by Dr. Charlies Constable on April 07, 2000. Left main 30%, LAD with much irregularity but no obstruction, circumflex 70% stenosis at first intermediate branch, RCA 80% mid at old stent site, 30% distal stenosis.  LV normal.  EF 60%.  Percutaneous intervention by Dr. Charlies Constable on April 07, 2000: Stent to the in-stent stenosis mid RCA with reduction of stenosis of 80% to less than 10%.  HOSPITAL COURSE:  This 70 year old male was admitted to the [facility] ER with complaints of chest pain similar to his previous myocardial infarction pain. His EKG upon admission revealed sinus rhythm, 1 mm J point elevation in I, V2, and V3.  His cardiac enzymes were negative for myocardial infarction.  He was placed on aspirin, heparin, IV nitroglycerin, and beta blocker.  His Coumadin was held until his INR lowered.  He was given vitamin K 5 mg on April 06, 2000.  He went for cardiac catheterization by Dr. Charlies Constable on April 07, 2000.  The results of the procedure are noted above.  The patient tolerated the procedure well.  He had some recurrent chest pain on the evening of April 07, 2000.  EKG was without any changes.  CK-MB was normal.  On the morning of April 08, 2000, he was felt to be stable enough for discharge to home.  His right groin was without hematoma or bruit.  The  patient was advised to remain on Plavix for a total of four weeks.  He was to restart his Coumadin once his Plavix regimen was finished.  He was started on Niaspan this admission for abnormal lipid profile.  DISCHARGE LABORATORY DATA:  White blood cell count 6400, hemoglobin 14.9, hematocrit 41.3, platelet count 204,000.  INR 1.6.  Sodium 133, potassium 3.8, chloride 98, CO2 27, glucose 100, BUN 10, creatinine 0.9, calcium 8.4, total protein 6.5, albumin 3.4, AST 20, ALT 18, alkaline phosphatase 52, total bilirubin 0.8, magnesium 1.7.  Cholesterol 200, triglycerides 329, HDL 31, LDL 103.  Chest x-ray: No active findings, stable chest.  DISCHARGE MEDICATIONS:  1. Plavix 75 mg q.d. x 4 weeks.  2. Niaspan 500 mg q.h.s.  3. Coumadin held until Plavix finished.  4. Imdur 60 mg q.d.  5. Altace 2 mg q.d.  6. Norvasc 10 mg q.d.  7. Tenormin 100 mg q.d.  8. Prilosec 20 mg q.d.  9. Vitamin E. 10. Zocor 10 mg q.h.s. 11. Aspirin q.d.  ACTIVITY:  The patient is to gradually increase his activity over the next 48 hours.  FOLLOWUP:  He has been advised to see the P.A. for Dr. Juanda Chance in two weeks and Dr. Juanda Chance in three months.  He should call the office for an appointment.  He will need fasting lipids checked prior to his followup appointment. DD:  05/10/00 TD:  05/10/00 Job: [id number]

## 2010-08-13 NOTE — Cardiovascular Report (Signed)
[name]. [facility]  Patient:    [name], [name]                          MRN: [id number] Proc. Date: 04/07/00 Adm. Date:  [id number] Attending:  Lenoria Farrier CC:         Stacie Glaze, M.D. [facility]  Cardiopulmonary Laboratory   Cardiac Catheterization  PROCEDURES PERFORMED:  Cardiac catheterization and stent implantation.  CLINICAL HISTORY:  [name] is 70 years old and has had multiple prior interventions.  He has a Palmaz-Schatz stent placed in the mid right coronary artery in 1996.  He has had subsequent remote PTCA of the LAD and he had a stent placed in the distal right coronary artery in July of 2000.  He was recently admitted with unstable angina and was scheduled for evaluation and angiography today.  DESCRIPTION OF PROCEDURE:  The procedure was performed via the right femoral artery using an arterial sheath and 6 French preformed coronary catheters.  A front wall arterial puncture was performed and Omnipaque contrast was used. After we completed the diagnostic study, we made a decision to proceed with intervention of the in-stent re-stenosis in the right coronary artery.  The patient was given weight-adjusted heparin to prolong the ACT to greater than 200 seconds and was given double bolus Integrilin and infusion.  We used a 6 [place] JR4 guiding catheter and a short BMW wire.  We crossed the lesion within the stent in the mid right coronary artery with a short BMW wire without difficulty.  We direct stented with a 3.0 x 18 mm Penta stent.  The in-stent re-stenosis was right at the articulation of the old 3.0 x 15 mm J&J stent and the re-stenosis extended to the proximal stent and outside the stent.  We positioned the stent about two-thirds the way down the J&J stent and extending out the proximal aspect of the stent.  We deployed this with one inflation of 12 atmospheres for 26 seconds.  We then went back in with a 3.25 x 15 mm PowerSail  and performed two inflations at 15 atmospheres for 41 and 34 seconds expanding both edges of the new stent.  Repeat diagnostic studies were then performed through the guiding catheter.  The patient tolerated the procedure well and left the laboratory in satisfactory condition.  RESULTS: The left main coronary artery:  The left main coronary artery had a 30% distal stenosis.  Left anterior descending: The left anterior descending artery gave rise to three septal perforators and diagonal branch.  There were much irregularity in the LAD but no major obstruction.  Circumflex artery: The circumflex artery gave rise to an intermediate branch, a first marginal branch, a second marginal branch and a small AV branch.  The first intermediate branch had 70% proximal stenosis.  There is no other major circumflex lesions.  Right coronary artery: The right coronary is a moderately large vessel that gave rise to a right ventricular branch, second right ventricular branch, posterior descending branch, and two posterolateral branches.  There was 80% stenosis at the midportion within the old stent that was put in in 1996 with the acute MI.  There was moderate disease which extended in the proximal aspect of the stent and outside the stent.  The stent in the distal right coronary artery has 0% stenosis, but there was 30% narrowing just outside the stent.  LEFT VENTRICULOGRAPHY: The left ventriculogram performed in the  RAO projection showed good wall motion with no areas of hypokinesis.  The estimated ejection fraction of 60%.  Following stenting of the in-stent re-stenosis in the mid right coronary artery, the stenosis improved from 80% to less than 10%.  There is no infection seen.  CONCLUSIONS:  Successful stenting of the in-stent re-stenosis in the mid right coronary artery with improvement in percent diameter narrowing from 80% to less than 10%.  DISPOSITION:  The patient was returned to the  postangioplasty unit for further observation. DD:  04/07/00 TD:  04/08/00 Job: [id number]

## 2010-08-13 NOTE — Cardiovascular Report (Signed)
NAMEMATTHER, LABELL NO.:  [id number]   MEDICAL RECORD NO.:  [id number]          PATIENT TYPE:  INP   LOCATION:  1830                         FACILITY:  [facility]   PHYSICIAN:  Salvadore Farber, M.D. LHCDATE OF BIRTH:  08-Apr-1940   DATE OF PROCEDURE:  05/12/2004  DATE OF DISCHARGE:                              CARDIAC CATHETERIZATION   PROCEDURE:  Left heart catheterization, left ventriculography, coronary  angiography.   INDICATIONS FOR PROCEDURE:  Mr. Klima is a 70 year old gentleman with  coronary artery disease status post multiple percutaneous revascularization  procedures.  He also has had multiple episodes of non-cardiac chest  discomfort.  He presents today with having complaint of about a month of  intermittent chest discomfort occurring with and without exertion.  He had  brief sharp 8 out of 10 chest discomfort this morning radiating to his  teeth.  He was mildly diaphoretic and, perhaps, short of breath.  Initial  cardiac enzymes were negative.  Due to his known disease, he is referred for  diagnostic angiography.   PROCEDURE TECHNIQUE:  Informed consent was obtained.  Under 1% lidocaine  local anesthesia, a 5 French sheath was placed in the right femoral artery  using the modified Seldinger technique.  Diagnostic angiography and  ventriculography were performed using JL4, JR4, and pigtail catheters.  The  patient tolerated the procedure well and was transferred to the holding room  in stable condition.   COMPLICATIONS:  None.   FINDINGS:  1.  Left ventricle:  109/7/13.  EF 65% without regional wall motion      abnormality.  2.  No aortic stenosis or mitral regurgitation.  3.  Left main:  40% stenosis distally.  4.  LAD:  Moderate size vessel giving rise to no significant diagonal      branches.  There is a 30% stenosis proximally.  The vessel is diffusely      diseased along its length without significant stenosis.  5.  Ramus intermedius:   Moderate size branching vessel.  There is a 50%      stenosis proximally.  6.  Circumflex:  Relatively small vessel giving rise to two obtuse      marginals.  The second marginal has a 50% stenosis proximally.  It is      unchanged from prior.  7.  Right coronary artery:  Large dominant vessel.  The vessel is diffusely      diseased along its course with the most severe stenosis being the 40%      stenosis distally.  The previously placed stent in the mid vessel was      widely patent without restenosis.   IMPRESSION/PLAN:  No change in his coronary anatomy compared with  catheterization of December 25, 2002.  I, therefore, suspect a noncardiac  etiology to his chest discomfort.  Will plan on observation overnight after  sheath removal and discharge in the morning.      WED/MEDQ  D:  05/12/2004  T:  05/12/2004  Job:  [id number]   cc:   Charlies Constable, M.D. [facility]  Lina Sar, M.D. [facility]

## 2010-08-13 NOTE — H&P (Signed)
[place]. [facility]  Patient:    [name], [name] Visit Number: [id number] MRN: [id number]          Service Type: MED Location: 1800 1831 01 Attending Physician:  Doug Sou Dictated by:   Evette Georges, M.D. [facility] Adm. Date:  11/15/2000   CC:         Bruce H. Swords, M.D. [facility]  Bayard Beaver. Nelle Don, M.D.  Bruce Elvera Lennox Juanda Chance, M.D. [facility]  Dora M. Juanda Chance, M.D. [facility]   History and Physical  DATE OF BIRTH: 04-Jan-1941  ADMITTING PHYSICIAN: Evette Georges, M.D.  PRIMARY CARE PHYSICIAN: Valetta Mole. Swords, M.D.  CARDIOLOGISTEverardo Beals Juanda Chance, M.D.  OPHTHALMOLOGIST: Bayard Beaver. Nelle Don, M.D.  NEPHROLOGIST: Barron Alvine, M.D.  GASTROENTEROLOGIST: Hedwig Morton. Juanda Chance, M.D.  CHIEF COMPLAINT: This is one of many [place]. [facility] admissions for this 70 year old married male, a former Pensions consultant at [employer], who comes in today for evaluation of lower gastrointestinal bleeding.  HISTORY OF PRESENT ILLNESS: The patient states he felt well until approximately 6:30 p.m. tonight, when he felt like he had to have a bowel movement.  He went to the bathroom and had bright red rectal bleeding.  This recurred about 30 minutes later.  He has had no bleeding since that time.  The family called EMS and he was brought to the emergency room for evaluation.  In the emergency room he had a complete evaluation.  H&H were 15.5 and 44.7.  He has had no further bleeding but because of history of diverticulosis as diagnosed by Dr. Lina Sar on previous colonoscopy about a year or 1-1/2 years ago he was admitted to the hospital for further evaluation and therapy.  PAST MEDICAL HISTORY:  1. Hospitalized for hemorrhoidectomy.  2. Partial thyroidectomy.  3. Appendectomy.  4. Gallbladder removal.  5. He states 10-12 hospital admissions for coronary problems.  He states he     has had MIs x 3 and stents x 3.  His last stent was in 2002.  Cardiac-wise     he has been  stable since that time and has not used any nitro.  PAST ILLNESSES: None.  INJURIES: None.  ALLERGIES: TICLID makes his mouth swell.  ADMISSION MEDICATIONS:  1. Coumadin 7.5 mg q.d. except for 5 mg Monday, Wednesday, and Friday.  2. Norvasc 10 mg q.d.  3. Altace 10 mg q.d.  4. Tenormin 100 mg in the morning and 50 mg in the p.m.  5. Prilosec 20 mg q.d.  6. Imdur 60 mg q.d.  7. Zocor 10 mg q.h.s.  8. Vitamin E 400 mg two tablets q.d.  9. Caltrate and vitamin D. 10. Fosamax 70 mg weekly.  SOCIAL HISTORY: He drinks an occasional drink of alcohol, and he continues to smoke half a pack to a pack of cigarettes a day.  We discussed using nitro patches as a way to wean him off the nicotine and keep him off the nicotine. He says his cardiologist said he did not want to use the patches because it could effect his heart.  REVIEW OF SYSTEMS: He has lost ten pounds over the last two months, working with his wife who has been going to [employer].  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  He saw Dr. Nelle Don and that resolved, and his visual acuity now is normal in that eye with glasses.  PULMONARY: He says he has never had any  history of lung disease.  He says nobody has ever told him he had any emphysema.  CARDIAC: As above.  GI: Diverticulosis diagnosed by Dr. Juanda Chance.  Otherwise, GI negative. GU: Pertinent in that he has had kidney stones blasted by Dr. Isabel Caprice. MUSCULOSKELETAL: He has had a history of osteoporosis, with minor trauma a couple of years ago and had a rib fracture and was hospitalized for that. Since that time he has been on calcium, vitamin D, and Fosamax.  FAMILY HISTORY: Dad died at 48 of an MI.  Mother died at 70 of an MI.  Four brothers, one died of MI, three have had coronary disease.  Two sisters, one has osteoporosis and the other has coronary disease.  SOCIAL HISTORY: He is retired and used to work as a Pensions consultant  for [employer].  He is married and lives with his wife.  PHYSICAL EXAMINATION:  VITAL SIGNS: TEMP 98 degrees, pulse 70 and regular, respirations 12 and regular, BP 110/70.  GENERAL: He is a well-developed male, in no acute distress.  HEENT: Negative, except he wears glasses and he has many teeth missing.  NECK: Supple.  Thyroid not enlarged.  No carotid bruits.  CHEST: Markedly decreased breath sounds that were barely audible.  He has bilateral wheezing.  CARDIAC: Examination negative.  ABDOMEN: Examination negative except for scars from previous gallbladder removal and appendectomy.  There was tenderness in the left lower quadrant but no rebound.  GU: Genitalia normal.  RECTAL: Bright red blood in rectal vault.  EXTREMITIES: Normal skin, normal peripheral pulses.  LABORATORY DATA: CBC shows hemoglobin 15.5, hematocrit 44.  Metabolic panel was normal.  INR was 2.1.  Pro time was 49.  Pulse oximetry on two liters was 95.  IMPRESSION:  1. Probable lower gastrointestinal bleed secondary to diverticulosis.  2. History of coronary artery disease, with myocardial infarction x 3 and     stenting x 3.  3. Ongoing tobacco abuse.  4. Hypertension.  5. Hyperlipidemia.  6. Gastroesophageal reflux disease.  7. Osteoporosis.  8. Chronic obstructive pulmonary disease.  9. Status post appendectomy. 10. Status post cholecystectomy. 11. Status post partial thyroidectomy. 12. Status post hemorrhoidectomy.  PLAN: The patient will be admitted and monitored.  Dr. Sheryn Bison was called, who is on-call for GI.  He will see the patient on an emergent basis tonight if he breaks through and starts bleeding and if not they will see him  and evaluate him in the morning.  We had a long discussion about the positive benefits of his health to stop smoking.  I explained to the patient it is never too late to quit.  We offered patches as an alternative but he said his cardiologist, Dr.  Juanda Chance, did not want him to be on the patches because of negative effects on his heart.  However, he is smoking half a pack to a pack of cigarettes a day and so I think we should try something in a controlled setting to see if this would help him sustain from nicotine. Dictated by:   Evette Georges, M.D. [facility] Attending Physician:  Doug Sou DD:  11/15/00 TD:  11/16/00 Job: [id number]

## 2010-08-13 NOTE — H&P (Signed)
[place]. [facility]  Patient:    DICK, HARK Visit Number: [id number] MRN: [id number]          Service Type: MED Location: 1800 [id number] Attending Physician:  Corlis Leak. Dictated by:   Lewayne Bunting, M.D. [facility] Admit Date:  03/06/2001   CC:         Bruce H. Swords, M.D. [facility]  Bruce R. Juanda Chance, M.D. [facility]   History and Physical  REFERRING PHYSICIAN:  Valetta Mole. Swords, M.D.  CARDIOLOGISTEverardo Beals Juanda Chance, M.D.  CURRENT COMPLAINTS:  New onset substernal chest pain.  HISTORY OF PRESENT ILLNESS:  [name] is a 70 year old white male with known coronary artery disease, status post prior PCI/stent procedure most recently in January of 2002.  At that time, the patient had successful stenting of an end-stent restenosis in the mid right coronary artery.  Of note is also that over the last several months the patient had noticed some bright red blood per rectum.  He received an evaluation by Hedwig Morton. Juanda Chance, M.D., and was found to have polyps.  The patient is now admitted with new onset substernal chest pain which started at 1:30 p.m. today.  The patient described it as extremely severe, close to a 10/10 chest pain with radiation to the left arm and left neck area.  This was associated with shortness of breath, as well as nausea and one episode of vomiting.  There was also associated diaphoresis.  The patient took several nitroglycerin at home and received several more by EMS on transfer to the emergency room.  He ultimately became pain-free.  On admission, there were no electrocardiographic changes of significant myocardial injury.  At the present time, the patient is pain-free.  He denies any palpitations, orthopnea, or PND.  ALLERGIES:  TICLID causes swelling of the mouth.  MEDICATIONS:  1. Coumadin 7.5 mg a day, except for Monday and Friday when he takes 5 mg.  2. Zocor 20 mg a day.  3. Norvasc 10 mg a day.  4. Imdur 60 mg a day.  5. Altace 10 mg a  day.  6. Atenolol 100 mg in the morning and 50 mg in the p.m.  7. Prilosec 20 mg a day.  8. Fosamax 70 mg p.o. every week.  9. Vitamin E 400 iu. 10. Caltrate.  PAST MEDICAL HISTORY:  1. History of amaurosis fugax for which the patient receives Coumadin which     has been initiated by Richard A. Alanda Amass, M.D., several years ago.  The     patient is also followed by his ophthalmologist, Bayard Beaver. Hollander, M.D.  2. Coronary artery disease, status post catheterization in January of 2002.     Found to have a 30% left main stenosis, no significant disease in the LAD     and no significant disease in the circumflex, and a short 70% obtuse     marginal lesion.  At that time, he also had an 80% end-stent restenosis.     The patient then underwent repeat stenting of the end-stent restenosis by     Bruce R. Juanda Chance, M.D.  Left ventricular ejection fraction 60%.  3. History of myocardial infarction.  4. Gastroesophageal reflux disease.  5. Osteoporosis.  6. COPD.  7. Hemorrhoids.  8. Lower GI bleed in August of 2002 secondary to diverticulitis, cecal     colitis, and polyps.  After this, the patient has been maintained on     Coumadin.  9. Status post  thyroidectomy. 10. History of cholecystectomy. 11. History of hemorrhoidectomy. 12. History of appendectomy.  CARDIAC RISK FACTORS:  Hypertension, hyperlipidemia, tobacco use, and family history.  SOCIAL HISTORY:  The patient lives in [place], [place], with his wife.  He is retired.  He has approximately a 40-pack-year history.  He does not drink alcohol.  He denies drugs or herbal medication use.  FAMILY HISTORY:  Mother died at the age of 42 of a myocardial infarction. Father died at the age of 55 of a myocardial infarction.  He also has several siblings with coronary artery disease, including one brother who died of an MI at age 33 and three brothers, all of who have coronary artery disease.  REVIEW OF SYSTEMS:  No fevers  or chills.  No headache or sore throat.  No rash or lesions.  Chest pain as outlined above.  No dyspnea on exertion.  No orthopnea or PND.  No palpitations or syncope.  No frequency or dysuria.  No weakness or numbness.  No myalgias or arthralgias.  No nausea or vomiting. Bright red blood per rectum as outlined above.  No polyuria or polydipsia. The remainder of the review of systems is within normal limits.  PHYSICAL EXAMINATION:  VITAL SIGNS:  Blood pressure 108/72, heart rate 56 beats per minute, respirations 24 breaths per minute, temperature 97.1 degrees, saturation 95% on room air.  GENERAL APPEARANCE:  A well-nourished white male in no apparent distress.  HEENT:  NCAT.  PERRLA.  EOMI.  The oropharynx is clear without erythema or exudate.  NECK:  Supple.  Faint left carotid bruit.  No JVD or hepatojugular reflux.  No lymphadenopathy.  HEART:  Regular rate and rhythm.  Normal S1 and S2.  No S3 or S4.  The PMI is nondisplaced.  Pulses are equal and palpable bilaterally.  LUNGS:  Diminished breath sounds at the bases, but no rhonchi or crackles.  SKIN:  No rashes or lesions.  ABDOMEN:  Soft and nontender.  No rebound.  GENITOURINARY:  Deferred.  RECTAL:  Heme positive.  No masses.  EXTREMITIES:  No clubbing, cyanosis, or edema.  MUSCULOSKELETAL:  No joint deformities.  NEUROLOGIC:  The patient is alert, oriented, and grossly nonfocal.  LABORATORY DATA:  Chest x-ray with no cardiomegaly and no pulmonary vascular congestion.  EKG with heart rate 63 beats per minute, normal sinus rhythm, normal axis, PR interval 183 msec, QRS 8 3 msec, and QTC 409 msec.  No significant ST-T wave changes.  Hemoglobin 14.2, hematocrit 42.9, white count 5.8, platelet count 204. Sodium 134, potassium 3.5, chloride 101, bicarbonate 25, BUN 11, creatinine 0.9, blood sugar 97, total bilirubin 0.5, AST 23, ALT 27.  CK 53,  CK-MB 1.1, troponin 0.01.  Total protein 6.6, albumin  3.6.  IMPRESSION AND PLAN: 1. Substernal chest pain.  The patients chest pain syndrome is consistent    with coronary insufficiency and prior anginal episodes.  He has known    coronary artery disease and has been on intervened on several occasions.    Despite the fact that the patient has no EKG changes, I suspect he may have    end-stent restenosis.  The plan is to proceed with cardiac catheterization    as soon as his INR level is less than 1.6.  It has been discussed with the    patient and he understands the risks and benefits of proceeding with a    repeat cardiac catheterization. 2. Anticoagulation.  Coumadin will be held.  Heparin will  be started without a    bolus. 3. Heme-positive stools.  The patient has a history of gastrointestinal bleed    in August of 2002, however, is hemoglobin is very stable.  He describe    bright red blood per rectum on Coumadin therapy.  The patient will be    re-evaluated by GI, particularly if he will need long-term Plavix therapy.    My inclination personally would be to consider stopping Coumadin,    particularly in the setting of normal left ventricular ejection fraction    and a history of atrial fibrillation. 4. Risk factor modification.  A lipid panel will be obtained and need for    further increase in the patients lipid-lowering therapy will be    reassessed. Dictated by:   Lewayne Bunting, M.D. [facility] Attending Physician:  Corlis Leak DD:  03/06/01 TD:  03/06/01 Job: [id number]

## 2010-08-13 NOTE — Cardiovascular Report (Signed)
NAME:  [name], [name] NO.:  [id number]   MEDICAL RECORD NO.:  [id number]                   PATIENT TYPE:  INP   LOCATION:  4727                                 FACILITY:  [facility]   PHYSICIAN:  Carole Binning, M.D. [facility]         DATE OF BIRTH:  [birth year]   DATE OF PROCEDURE:  12/25/2002  DATE OF DISCHARGE:                              CARDIAC CATHETERIZATION   PROCEDURE PERFORMED:  Left heart catheterization with coronary angiography  and left ventriculography.   INDICATIONS:  [name] is a 70 year old male with a history of coronary  artery disease and prior interventions to the left anterior descending  artery and the right coronary artery.  He presented to the hospital with  chest pain similar to his previous angina.  He was referred for cardiac  catheterization.   PROCEDURAL NOTE:  A 6-French sheath was placed in the right femoral artery.  Coronary angiography was performed with 6-French JL4 and JR4 catheters.  Left ventriculography and abdominal aortography were performed with an  angled pigtail catheter.   CONTRAST:  Omnipaque.   COMPLICATIONS:  There were no complications.   RESULTS:   HEMODYNAMICS:  1. Left ventricular pressure 120/28.  2. Aortic pressure 120/68.  3. There is no aortic valve gradient.   LEFT VENTRICULOGRAM:  Wall motion is normal.  Ejection fraction calculated  at 62%.  There is no mitral regurgitation.   Abdominal aortogram reveals patent renal arteries.  There is mild  atherosclerotic disease of the bilateral iliac arteries.  The abdominal  aorta itself has minimal atherosclerotic disease.   CORONARY ANGIOGRAPHY (RIGHT DOMINANT):  1. The left main has a distal 40% stenosis.  2. The left anterior descending artery has a diffuse 20% stenosis in the     proximal mid vessel.  In the mid LAD there is a stent which is widely     patent with 0% stenosis within the stent.  The LAD gives rise to a single     small  diagonal branch.  3. The left circumflex gives rise to a normal-sized but small-caliber ramus     intermedius, a small OM 1, and a normal-sized OM 2.  There is a 70%     stenosis in the ramus intermedius.  The mid circumflex before OM 2 has a     70-80% stenosis.  4. The right coronary artery is a dominant vessel.  There is a 20% stenosis     in the mid vessel.  There is a stent in the distal portion of the mid     vessel at the acute margin which has less than 20% stenosis within the     stent.  In the distal right coronary artery is diffuse 20% stenosis     proximal to the posterior descending artery.  Further down in the AV     groove portion of the distal right coronary  artery there is a 20%     stenosis prior to the third posterolateral branch.  The distal right     coronary artery gives rise to a normal-sized posterior descending artery,     very small first and second posterolateral branches, a normal-sized third     posterolateral branch, and a small fourth posterolateral branch.   IMPRESSION:  1. Normal left ventricular systolic function.  2. Patent stents in the left anterior descending artery and the right     coronary artery.  3. Moderate disease in the ramus intermedius and the mid left circumflex.     In comparison to previous catheterizations, this disease was seen on     previous catheterizations.  There may have been some mild progression of     disease, but it does not appear to be significantly changed.   PLAN:  We will proceed with an adenosine Cardiolite to rule out ischemia.  If this does show evidence of ischemia in the lateral wall, then we will  proceed with percutaneous intervention of the left circumflex.                                               Carole Binning, M.D. [facility]    MWP/MEDQ  D:  12/25/2002  T:  12/25/2002  Job:  [id number]   cc:   Valetta Mole. Swords, M.D. [facility]   Charlies Constable, M.D.

## 2010-08-13 NOTE — Discharge Summary (Signed)
[place]. [facility]  Patient:    [name], [name] Visit Number: [id number] MRN: [id number]          Service Type: MED Location: 3700 [id number] Attending Physician:  Lenoria Farrier Dictated by:   Guy Franco, P.A.-C. [facility] Admit Date:  03/06/2001 Discharge Date: 03/09/2001   CC:         Valetta Mole. Swords, M.D. [facility]  Bruce R. Juanda Chance, M.D. [facility]   Referring Physician Discharge [place]  DISCHARGE DIAGNOSES:  1. Coronary artery disease, status post percutaneous transluminal     angioplasty/stent to the proximal left anterior descending lesion 02/26/01     by Dr. Chales Abrahams.  2. Substernal chest pain, resolved.  3. History of amaurosis fugax, which the patient has been on Coumadin for the     episode that occurred several years ago.  4. Gastroesophageal reflux disease.  5. Osteoporosis.  6. Chronic obstructive pulmonary disease.  7. Hemorrhoids.  8. History of lower gastrointestinal bleed in August 2002, secondary to     diverticulitis, cecal colitis, and polyps.  No problems while on Coumadin     since that time.  9. Status post thyroidectomy. 10. History of cholecystectomy. 11. History of hemorrhoidectomy. 12. History of appendectomy. 13. Hypertension. 14. Hyperlipidemia. 15. History of tobacco abuse. 16. Family history of coronary artery disease.  HOSPITAL COURSE:  Mr. [name] was admitted to [facility] on 03/06/01 after experiencing substernal chest pain.  His EKG revealed no significant EKG changes from previous EKGs.  His Coumadin was held during this time in preparation for cardiac catheterization.  On 03/08/01, he underwent cardiac catheterization by Dr. Diona Browner and was found to have a 20% left main distally, LAD 90% mid, diffuse 30-40% disease, ramus very small with a 70% lesion, circumflex 60% OM-1, 30% proximal, right coronary artery with a 20% in-stent mid lesion, 30% distal, and 40% PDA.  Left ventriculogram revealed an EF of ______ % with trace  MR, no active disease.  At that time, the patient then underwent PTCA/stent to the proximal LAD lesion by Dr. Chales Abrahams.  The patient tolerated the procedure well and the following day, was felt stable enough to go home.  Lab studies during his hospital stay included a hemoglobin of 13.9, hematocrit 40.0, platelets 186, white count 6.2.  Sodium 134, potassium 3.5, BUN 11, creatinine 0.9.  Liver functions normal.  Cardiac isoenzymes and troponins during the first two sets were normal.  The last set of cardiac isoenzymes revealed a mildly elevated troponin at 0.08.  Total cholesterol 169, triglycerides 144, HDL 37, LDL 103.  Chest x-ray with no active chest disease.  DISCHARGE MEDICATIONS:  1. Plavix 75 mg 1 p.o. q.d.  2. Baby aspirin 81 mg q.d.  3. Zocor 20 mg q.d.  4. Norvasc 10 mg q.d.  5. Altace 10 mg q.d.  6. Prilosec 20 mg q.d.  7. Atenolol 100 mg in the morning, 50 mg in the evening.  8. Fosamax 75 mg weekly.  9. Vitamin E q.d. 10. Imdur 60 mg 1 p.o. q.d. 11. Sublingual nitroglycerin p.r.n. chest pain.  ACTIVITY:  No strenuous activity.  DIET:  Remain on a low fat diet.  WOUND CARE:  Clean cath site with soap and water.  He is not to restart Coumadin.  He is to follow-up with Dr. Juanda Chance on March 30, 2001, at 10:30 a.m. Dictated by:   Guy Franco, P.A.-C. [facility] Attending Physician:  Lenoria Farrier DD:  03/09/01 TD:  03/09/01 Job: [id number]  [id number]

## 2010-08-13 NOTE — Cardiovascular Report (Signed)
[place]. [facility]  Patient:    [name], [name]                          MRN: [id number] Proc. Date: 05/28/99 Adm. Date:  [id number] Disc. Date: [id number] Attending:  Lenoria Farrier CC:         Cardiac Catheterization Laboratory             Stacie Glaze, M.D. [facility]                        Cardiac Catheterization  PROCEDURE:  Cardiac catheterization.  CARDIOLOGISTEverardo Beals Juanda Chance, M.D.  INDICATIONS:  The patient is 70 years old and has had previous diaphragmatic wall infarction in 1996, treated with a stenting of the right coronary artery. Later in 1996, he had a PTCA of the distal LAD by Dr. Runell Gess.  In July 2000, e had a stenting of the distal right coronary artery.  He was seen in the office y Dian Queen, P.A.-C. recently with increasing chest pain, and his Coumadin was held, and he was scheduled for an elective evaluation today.  DESCRIPTION OF PROCEDURE:  The procedure was performed via the right femoral artery and arterial sheath, and a 6-French preformed coronary catheters.  A femoral arterial puncture was performed and Omnipaque contrast was used.  At the end of the procedure we did a femoral angiogram to see if he was a candidate for a Perclose, but the vessel had moderate calcification, and we elected not to close the vessel.  The patient tolerated the procedure well and left the laboratory in satisfactory condition.  The patient had been consented for the REVERSAL trial, and was eligible only for IVUS on the circumflex artery, since the LAD and right coronary artery had had previous interventions.  We felt that the circumflex coronary artery was too small in caliber and his blood pressure was too low at 95, to allow nitroglycerin. For these reasons we elected not to enroll him in the REVERSAL trial.  RESULTS: 1. Left main coronary artery:  The left main coronary artery had a 40% distal    stenosis. 2. Left  anterior descending coronary artery:  The left anterior descending    coronary artery gave rise to two septal perforators and a diagonal branch.    The vessel was diffusely irregular and there were tandem 40% stenoses in the    proximal vessel, 40% narrowing in the midvessel, and 50% narrowing in the    distal vessel. 3. Circumflex coronary artery:  Gave rise to an intermediate branch, an early    marginal branch, and a posterolateral branch.  There was 40% narrowing near he    ostium of the circumflex coronary artery. 4. Right coronary artery:  Was a moderately-large vessel that was tortuous    in its proximal portion and then gave rise to two right ventricular branches,    a posterior descending, and two posterolateral branches.  There was 70%    narrowing in the midvessel just above the stent in the midvessel that had    been placed in 1996.  There was 50% narrowing within the stent, which was    fairly focal, and there was 80% narrowing in a marginal side branch that    arose in the midportion of the stent.  The distal stent had 0% stenosis. The    distal vessel was irregular,  but there was no major obstruction.  LEFT VENTRICULOGRAM:  The left ventriculogram performed in the RAO projection showed slight hypokinesis of the mid-inferior wall.  The overall wall motion was good with an estimated ejection fraction of 60%.  PRESSURES: Aortic pressure:  88/52, with a mean of 65. Left ventricular pressure:  88/6.  CONCLUSION:  Coronary artery disease, status post prior diaphragmatic myocardial infarction, treated with stenting of the mid-right coronary artery in 1996, status post percutaneous transluminal coronary angioplasty of the distal left anterior  descending coronary artery in 1996, and status post stenting of the distal right coronary artery in July 2000.  Currently there is 40% narrowing in the left main coronary artery, multiple 40% and 50% stenoses in the proximal  mid-left anterior descending coronary artery, 40% narrowing in the proximal circumflex coronary artery, with 70% narrowing in an intermediate branch, and 70% midstenosis in the right coronary artery, with 50% stenosis within the stent in the mid-right coronary artery, and 0% stenosis in the stent in the distal right coronary artery, and slight inferior wall hypokinesis.  RECOMMENDATIONS:  It does not appear that there are any lesions tight enough to be flow-limiting.  Will plan reassurance, continued secondary prevention, and an outpatient Cardiolite scan, to further assess the significance of the lesions.DD:  05/28/99 TD:  05/30/99 Job: [id number]

## 2010-08-13 NOTE — Discharge Summary (Signed)
[place]. [facility]  Patient:    [name], [name] Visit Number: [id number] MRN: [id number]          Service Type: MED Location: [id number] Attending Physician:  Evette Georges Dictated by:   Cornell Barman, P.A. Adm. Date:  11/15/2000 Disc. Date: 11/17/2000   CC:         Hedwig Morton. Juanda Chance, M.D. [facility]   Discharge Summary  DISCHARGE DIAGNOSIS:  Gastrointestinal bleed.  HISTORY OF PRESENT ILLNESS:  [name] is a 70 year old white male who presented for evaluation of a lower GI bleed.  The patient states he felt well until about 6:30 in the evening when he had a bowel movement.  He went to the bathroom and noticed bright red rectal bleeding.  This recurred about 30 minutes later.  The patient presented to the emergency room for evaluation. He does have a history of diverticulosis, and we were concerned that he was having a diverticular bleed.  PAST MEDICAL HISTORY: 1. Status post partial thyroidectomy. 2. Status post appendectomy. 3. Status post cholecystectomy. 4. Coronary artery disease, status post MI, status post stent placement, last    stent in 2002. 5. History of diverticulosis by colonoscopy in 1999.  Colonoscopy revealed    focal cecal colitis.  Biopsy was unremarkable, with left-sided polyps and    left-sided diverticulosis.  Endoscopy in 1993 revealed a hiatal hernia with    mild distal esophageal stricture and duodenitis.  HOSPITAL COURSE: #1 - GASTROINTESTINAL:  The patient presented with what was presumed to be a lower GI bleed, probably diverticular in origin.  We asked for GI to see the patient, who recommended following up with a colonoscopy.  The patients Coumadin was held for the colonoscopy.  Colonoscopy was performed on November 17, 2000.  This revealed diverticulosis of the sigmoid colon and cecum that were not bleeding.  The patient had a polyp in the sigmoid colon that was removed and sent for pathology.  He had multiple polyps  in the rectum.  These were hot biopsied.  The polyps were removed and sent for pathology.  The cecum was not bleeding.  He had some small internal hemorrhoids as well.  No further intervention was required.  The patient was advised not to use aspirin or nonsteroidal anti-inflammatory drugs for two weeks and not to restart his Coumadin for at least five days.  DISCHARGE LABORATORY DATA:  Protime was 18.4, INR 1.7.  Hemoglobin 14.1, hematocrit 40.6.  DISCHARGE MEDICATIONS:  He was instructed to resume his home medications, including: 1. Norvasc 10 mg q.d. 2. Imdur 60 mg q.d. 3. Altace 10 mg q.d. 4. Prilosec 20 mg q.d. 5. Zocor 10 mg q.d. 6. Atenolol 100 mg in the morning, 50 mg in the evening. 7. Coumadin:  He will resume his home dose, but he is instructed not to start    this for another five days.  FOLLOW-UP:  The patient [facility] five days after restarting Coumadin for a protime.  He should follow up with Dr. Cato Newman in two to three weeks. Dictated by:   Cornell Barman, P.A. Attending Physician:  Evette Georges DD:  11/17/00 TD:  11/19/00 Job: [id number]

## 2010-08-17 ENCOUNTER — Other Ambulatory Visit: Payer: Self-pay | Admitting: *Deleted

## 2010-08-18 ENCOUNTER — Ambulatory Visit (INDEPENDENT_AMBULATORY_CARE_PROVIDER_SITE_OTHER): Payer: Medicare Other | Admitting: Internal Medicine

## 2010-08-18 DIAGNOSIS — D518 Other vitamin B12 deficiency anemias: Secondary | ICD-10-CM

## 2010-08-18 MED ORDER — CYANOCOBALAMIN 1000 MCG/ML IJ SOLN
1000.0000 ug | INTRAMUSCULAR | Status: DC
  Administered 2010-08-18: 1000 ug via INTRAMUSCULAR

## 2010-09-01 ENCOUNTER — Other Ambulatory Visit: Payer: Self-pay | Admitting: Internal Medicine

## 2010-09-06 ENCOUNTER — Encounter: Payer: Self-pay | Admitting: Cardiovascular Disease

## 2010-09-07 ENCOUNTER — Ambulatory Visit (INDEPENDENT_AMBULATORY_CARE_PROVIDER_SITE_OTHER): Payer: Medicare Other | Admitting: Cardiovascular Disease

## 2010-09-07 ENCOUNTER — Encounter: Payer: Self-pay | Admitting: Cardiovascular Disease

## 2010-09-07 DIAGNOSIS — I739 Peripheral vascular disease, unspecified: Secondary | ICD-10-CM

## 2010-09-07 DIAGNOSIS — I70219 Atherosclerosis of native arteries of extremities with intermittent claudication, unspecified extremity: Secondary | ICD-10-CM

## 2010-09-07 DIAGNOSIS — R0609 Other forms of dyspnea: Secondary | ICD-10-CM

## 2010-09-07 DIAGNOSIS — I2510 Atherosclerotic heart disease of native coronary artery without angina pectoris: Secondary | ICD-10-CM

## 2010-09-07 DIAGNOSIS — J449 Chronic obstructive pulmonary disease, unspecified: Secondary | ICD-10-CM

## 2010-09-07 NOTE — Progress Notes (Signed)
HPI:  This is a 70 year old gentleman presenting for followup evaluation. The patient has coronary artery disease status post multiple PCI procedures. He ultimately underwent coronary bypass surgery in July 2011. At that time he had moderately severe diffuse three-vessel coronary disease with preserved LV function. Since bypass surgery he continues to have marked shortness of breath. He denies chest pain or pressure. The patient has undergone pulmonary evaluation and has been found to have significant COPD with an FEV1 around 1 L. He has a history of long-standing tobacco use.  The patient also has a history of lower extremity peripheral arterial disease. He has undergone iliac stenting on 2 separate occasions. He denies leg claudication but reports somewhat limitation from his breathing that he doesn't really do much walking. He denies edema, orthopnea, or PND. He has no other complaints at this time.  Outpatient Encounter Prescriptions as of 09/07/2010  Medication Sig Dispense Refill  . aspirin 81 MG tablet Take 81 mg by mouth daily.        . budesonide-formoterol (SYMBICORT) 80-4.5 MCG/ACT inhaler Inhale 2 puffs into the lungs 2 (two) times daily.      . Calcium Carbonate-Vit D-Min (CALTRATE 600+D PLUS) 600-400 MG-UNIT per tablet Chew 1 tablet by mouth daily.        . cetirizine (ZYRTEC ALLERGY) 10 MG tablet Take 1 tablet (10 mg total) by mouth every morning.      . cyanocobalamin (,VITAMIN B-12,) 1000 MCG/ML injection Inject 1 mL (1,000 mcg total) into the muscle every 30 (thirty) days.      Marland Kitchen dextromethorphan-guaiFENesin (MUCINEX DM) 30-600 MG per 12 hr tablet Take 1 tablet by mouth every 12 (twelve) hours as needed. cough       . famotidine (PEPCID) 20 MG tablet Take 1 tablet (20 mg total) by mouth at bedtime.      . furosemide (LASIX) 20 MG tablet Take 20 mg by mouth daily.        Marland Kitchen levalbuterol (XOPENEX HFA) 45 MCG/ACT inhaler Inhale 2 puffs into the lungs every 4 (four) hours as needed.          Marland Kitchen losartan (COZAAR) 100 MG tablet Take 1 tablet (100 mg total) by mouth daily.  30 tablet  11  . Nebivolol HCl (BYSTOLIC) 20 MG TABS Take 1/2 tab by mouth every morning      . pantoprazole (PROTONIX) 40 MG tablet Take 1 tablet (40 mg total) by mouth daily before breakfast.      . rosuvastatin (CRESTOR) 20 MG tablet daily. Take 1/2 tab by mouth every morning      . sildenafil (VIAGRA) 50 MG tablet Take 1 tablet (50 mg total) by mouth as needed for erectile dysfunction.  10 tablet  1  . Teriparatide, Recombinant, (FORTEO) 600 MCG/2.4ML SOLN Inject 20 mcg into the skin daily.        Marland Kitchen tiotropium (SPIRIVA) 18 MCG inhalation capsule Place 18 mcg into inhaler and inhale daily.        Marland Kitchen triamcinolone (KENALOG) 0.1 % cream Apply as needed for rash      . DISCONTD: calcium carbonate (TUMS) 500 MG chewable tablet As needed for indegestion      . DISCONTD: triamcinolone (KENALOG) 0.025 % ointment APPLY 1 A SMALL AMOUNT TO AFFECTED AREA TWICE A DAY  80 g  2   Facility-Administered Encounter Medications as of 09/07/2010  Medication Dose Route Frequency Provider Last Rate Last Dose  . cyanocobalamin ((VITAMIN B-12)) injection 1,000 mcg  1,000 mcg Intramuscular  Q30 days Judie Petit, MD   1,000 mcg at 08/18/10 1119    Allergies  Allergen Reactions  . Ticlopidine Hcl     REACTION: swelling    Past Medical History  Diagnosis Date  . HYPERLIPIDEMIA 11/21/2006  . HYPERTENSION 11/21/2006  . MYOCARDIAL INFARCTION, HX OF 11/21/2006  . CORONARY ARTERY DISEASE 11/21/2006  . OSTEOPOROSIS 11/21/2006  . COLONIC POLYPS 05/21/2007  . COPD 05/21/2007  . HIATAL HERNIA 05/21/2007  . DIVERTICULAR DISEASE 05/21/2007  . NEPHROLITHIASIS 05/21/2007  . GI BLEEDING 09/09/2008  . ANEMIA, B12 DEFICIENCY 12/02/2008  . CAROTID ARTERY STENOSIS 04/16/2009  . PERIPHERAL VASCULAR DISEASE 09/02/2009  . Atrial fibrillation 11/06/2009  . Allergic rhinitis   . Osteoporosis   . Hemoptysis     ROS: Negative except as per HPI  BP  108/62  Pulse 80  Ht 5\' 4"  (1.626 m)  Wt 160 lb (72.576 kg)  BMI 27.46 kg/m2  PHYSICAL EXAM: Pt is alert and oriented, NAD HEENT: normal Neck: JVP - normal, carotids 2+= without bruits Lungs: CTA bilaterally CV: RRR with distant heart sounds, no murmur appreciated. Abd: soft, NT, Positive BS, no hepatomegaly Ext: no C/C/E, DP and PT pulses are 2+ on the right and 1+ on the left. There is no edema. Skin: warm/dry no rash  EKG:  EKG dated 4 32,012 shows normal sinus rhythm with nonspecific T-wave abnormality.  ASSESSMENT AND PLAN:

## 2010-09-07 NOTE — Patient Instructions (Signed)
Your physician has requested that you have an echocardiogram in the next few weeks. Echocardiography is a painless test that uses sound waves to create images of your heart. It provides your doctor with information about the size and shape of your heart and how well your heart's chambers and valves are working. This procedure takes approximately one hour. There are no restrictions for this procedure.  Your physician wants you to follow-up in: 6 MONTHS.  You will receive a reminder letter in the mail two months in advance. If you don't receive a letter, please call our office to schedule the follow-up appointment.  Your physician recommends that you continue on your current medications as directed. Please refer to the Current Medication list given to you today.

## 2010-09-07 NOTE — Assessment & Plan Note (Signed)
The patient has a stable exam and is not limited by his PAD. He needs continued aggressive risk reduction measures.

## 2010-09-07 NOTE — Assessment & Plan Note (Signed)
The patient is status post coronary bypass surgery. Recommend continued medical management for his CAD.

## 2010-09-07 NOTE — Assessment & Plan Note (Signed)
Suspect most of his limitation and exertional dyspnea is related to COPD. However his symptoms do seem worse since bypass. He has undergone a followup Myoview scan that showed no significant ischemia. I recommended an echocardiogram to rule out LV dysfunction, pulmonary hypertension, or other potential causes of shortness of breath.

## 2010-09-10 ENCOUNTER — Other Ambulatory Visit: Payer: Self-pay | Admitting: Internal Medicine

## 2010-09-11 ENCOUNTER — Other Ambulatory Visit: Payer: Self-pay | Admitting: Internal Medicine

## 2010-09-15 ENCOUNTER — Ambulatory Visit (HOSPITAL_COMMUNITY): Payer: Medicare Other | Attending: Internal Medicine | Admitting: Radiology

## 2010-09-15 ENCOUNTER — Ambulatory Visit: Payer: Medicare Other | Admitting: Internal Medicine

## 2010-09-15 ENCOUNTER — Other Ambulatory Visit: Payer: Self-pay | Admitting: *Deleted

## 2010-09-15 DIAGNOSIS — I2510 Atherosclerotic heart disease of native coronary artery without angina pectoris: Secondary | ICD-10-CM | POA: Insufficient documentation

## 2010-09-15 DIAGNOSIS — R0609 Other forms of dyspnea: Secondary | ICD-10-CM | POA: Insufficient documentation

## 2010-09-15 DIAGNOSIS — R0989 Other specified symptoms and signs involving the circulatory and respiratory systems: Secondary | ICD-10-CM | POA: Insufficient documentation

## 2010-09-15 DIAGNOSIS — I517 Cardiomegaly: Secondary | ICD-10-CM

## 2010-09-15 MED ORDER — PANTOPRAZOLE SODIUM 40 MG PO TBEC: 40.0000 mg | DELAYED_RELEASE_TABLET | Freq: Two times a day (BID) | ORAL | Status: DC

## 2010-09-16 ENCOUNTER — Telehealth: Payer: Self-pay | Admitting: Cardiovascular Disease

## 2010-09-16 NOTE — Telephone Encounter (Signed)
Pt given results of echo 

## 2010-09-16 NOTE — Telephone Encounter (Signed)
Per pt returning call back to pat.

## 2010-10-18 ENCOUNTER — Ambulatory Visit: Payer: Medicare Other | Admitting: Internal Medicine

## 2010-10-19 ENCOUNTER — Ambulatory Visit: Payer: Medicare Other | Admitting: Internal Medicine

## 2010-10-20 ENCOUNTER — Ambulatory Visit (INDEPENDENT_AMBULATORY_CARE_PROVIDER_SITE_OTHER): Payer: Medicare Other | Admitting: Internal Medicine

## 2010-10-20 DIAGNOSIS — E538 Deficiency of other specified B group vitamins: Secondary | ICD-10-CM

## 2010-10-20 MED ORDER — CYANOCOBALAMIN 1000 MCG/ML IJ SOLN
1000.0000 ug | INTRAMUSCULAR | Status: DC
  Administered 2010-10-20 – 2011-08-08 (×9): 1000 ug via INTRAMUSCULAR

## 2010-11-19 ENCOUNTER — Ambulatory Visit (INDEPENDENT_AMBULATORY_CARE_PROVIDER_SITE_OTHER): Payer: Medicare Other | Admitting: Internal Medicine

## 2010-11-19 DIAGNOSIS — D518 Other vitamin B12 deficiency anemias: Secondary | ICD-10-CM

## 2010-11-19 MED ORDER — CYANOCOBALAMIN 1000 MCG/ML IJ SOLN
1000.0000 ug | Freq: Once | INTRAMUSCULAR | Status: AC
  Administered 2010-11-19: 1000 ug via INTRAMUSCULAR

## 2010-12-11 ENCOUNTER — Other Ambulatory Visit: Payer: Self-pay | Admitting: Cardiovascular Disease

## 2010-12-17 LAB — BASIC METABOLIC PANEL
CO2: 26
Calcium: 9.2
GFR calc Af Amer: >60
Potassium: 3.6
Sodium: 138

## 2010-12-17 LAB — CBC
Hemoglobin: 12.1 — ABNORMAL LOW
MCHC: 33.1
RBC: 4.46

## 2010-12-17 LAB — PROTIME-INR: INR: 1

## 2010-12-20 ENCOUNTER — Ambulatory Visit (INDEPENDENT_AMBULATORY_CARE_PROVIDER_SITE_OTHER): Payer: Medicare Other | Admitting: Internal Medicine

## 2010-12-20 DIAGNOSIS — D518 Other vitamin B12 deficiency anemias: Secondary | ICD-10-CM

## 2010-12-20 DIAGNOSIS — E538 Deficiency of other specified B group vitamins: Secondary | ICD-10-CM

## 2010-12-20 DIAGNOSIS — Z23 Encounter for immunization: Secondary | ICD-10-CM

## 2010-12-21 LAB — URINALYSIS, ROUTINE W REFLEX MICROSCOPIC
Bilirubin Urine: NEGATIVE
Hgb urine dipstick: NEGATIVE
Ketones, ur: NEGATIVE
Nitrite: NEGATIVE
Protein, ur: NEGATIVE
Specific Gravity, Urine: 1.025
Urobilinogen, UA: 1

## 2010-12-21 LAB — COMPREHENSIVE METABOLIC PANEL
ALT: 13
Albumin: 3.7
Alkaline Phosphatase: 38 — ABNORMAL LOW
BUN: 13
Chloride: 100
Glucose, Bld: 92
Potassium: 3.8
Sodium: 136
Total Bilirubin: 0.5
Total Protein: 6.4

## 2010-12-21 LAB — CBC
HCT: 33 — ABNORMAL LOW
Hemoglobin: 10.8 — ABNORMAL LOW
Platelets: 210
WBC: 5.4

## 2010-12-21 LAB — CARDIAC PANEL(CRET KIN+CKTOT+MB+TROPI)
CK, MB: 1.1
CK, MB: 1.3
CK, MB: 1.3
Relative Index: INVALID
Total CK: 46
Total CK: 57
Total CK: 57
Troponin I: 0.01

## 2010-12-21 LAB — APTT: aPTT: 31

## 2010-12-21 LAB — DIFFERENTIAL
Basophils Absolute: 0
Basophils Relative: 0
Eosinophils Absolute: 0.2
Eosinophils Relative: 3
Monocytes Absolute: 0.6
Monocytes Relative: 12
Neutro Abs: 3.4

## 2010-12-21 LAB — URINE MICROSCOPIC-ADD ON

## 2010-12-24 LAB — PROTIME-INR
INR: 1.1
Prothrombin Time: 14

## 2010-12-24 LAB — CBC
HCT: 34 — ABNORMAL LOW
MCV: 85.2
Platelets: 211
RBC: 3.99 — ABNORMAL LOW
WBC: 4.5

## 2010-12-24 LAB — POCT I-STAT, CHEM 8
BUN: 11
Calcium, Ion: 1.19
Chloride: 103
Creatinine, Ser: 0.9
Glucose, Bld: 92
HCT: 36 — ABNORMAL LOW
Hemoglobin: 12.2 — ABNORMAL LOW
Potassium: 3.7
Sodium: 139
TCO2: 28

## 2010-12-24 LAB — POCT CARDIAC MARKERS
CKMB, poc: 1.5
CKMB, poc: <1 — ABNORMAL LOW
Myoglobin, poc: 37.7
Myoglobin, poc: 49.9
Troponin i, poc: <0.05
Troponin i, poc: <0.05

## 2010-12-24 LAB — APTT: aPTT: 35

## 2011-01-19 ENCOUNTER — Ambulatory Visit (INDEPENDENT_AMBULATORY_CARE_PROVIDER_SITE_OTHER): Payer: Medicare Other | Admitting: Internal Medicine

## 2011-01-19 DIAGNOSIS — D518 Other vitamin B12 deficiency anemias: Secondary | ICD-10-CM

## 2011-02-14 ENCOUNTER — Other Ambulatory Visit: Payer: Self-pay | Admitting: Internal Medicine

## 2011-02-14 ENCOUNTER — Ambulatory Visit (INDEPENDENT_AMBULATORY_CARE_PROVIDER_SITE_OTHER): Payer: Medicare Other | Admitting: Internal Medicine

## 2011-02-14 DIAGNOSIS — Z114 Encounter for screening for human immunodeficiency virus [HIV]: Secondary | ICD-10-CM

## 2011-02-14 DIAGNOSIS — D518 Other vitamin B12 deficiency anemias: Secondary | ICD-10-CM

## 2011-02-14 DIAGNOSIS — Z20828 Contact with and (suspected) exposure to other viral communicable diseases: Secondary | ICD-10-CM

## 2011-02-14 NOTE — Progress Notes (Signed)
Addended by: Rossie Muskrat K on: 02/14/2011 10:50 AM   Modules accepted: Orders

## 2011-02-15 LAB — HEPATITIS B SURFACE ANTIBODY, QUANTITATIVE: Hepatitis B-Post: 8 m[IU]/mL

## 2011-02-15 LAB — HEPATITIS B CORE ANTIBODY, TOTAL: Hep B Core Total Ab: NEGATIVE

## 2011-02-15 LAB — HEPATITIS B SURFACE ANTIBODY,QUALITATIVE

## 2011-02-21 ENCOUNTER — Other Ambulatory Visit: Payer: Self-pay | Admitting: *Deleted

## 2011-02-21 MED ORDER — TIOTROPIUM BROMIDE MONOHYDRATE 18 MCG IN CAPS: 18.0000 ug | ORAL_CAPSULE | Freq: Every day | RESPIRATORY_TRACT | Status: DC

## 2011-03-08 IMAGING — CT CT CHEST W/O CM
2 of 3 series · 15 of 36 positions shown, 18 images · non-contrast
Comparison: 10/21/2009 and 10/19/2009 chest x-rays.

CLINICAL DATA: Drainage from sternal incision.

CT CHEST WITHOUT CONTRAST
TECHNIQUE: Multidetector CT imaging of the chest was performed
following the standard protocol without IV contrast.

[Series 2: routine chest 5.0 st · axial · 0.74mm/px · z∈[+1132,+1392]mm · 12 of 62 slices shown, 15 images]
[im 5/62  mediastinal]
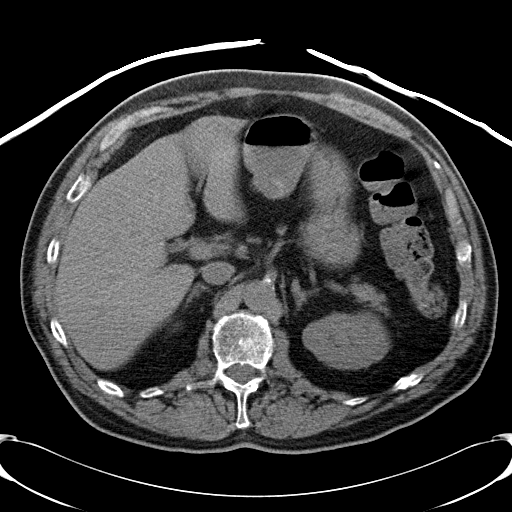
[im 5/62  lung]
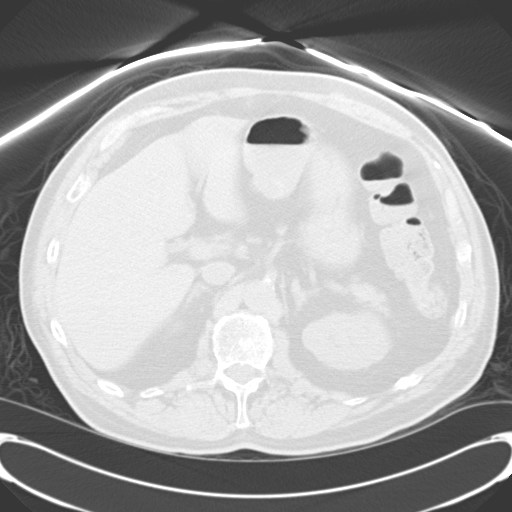
[im 10/62  lung]
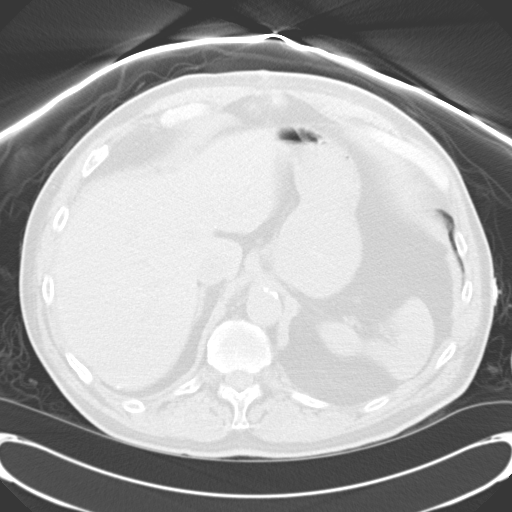
[im 14/62  lung]
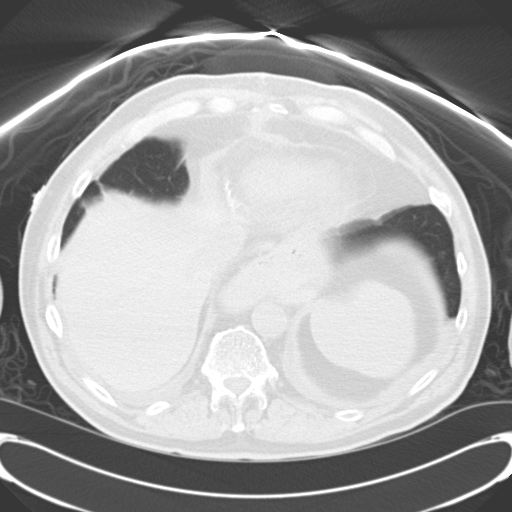
[im 19/62  lung]
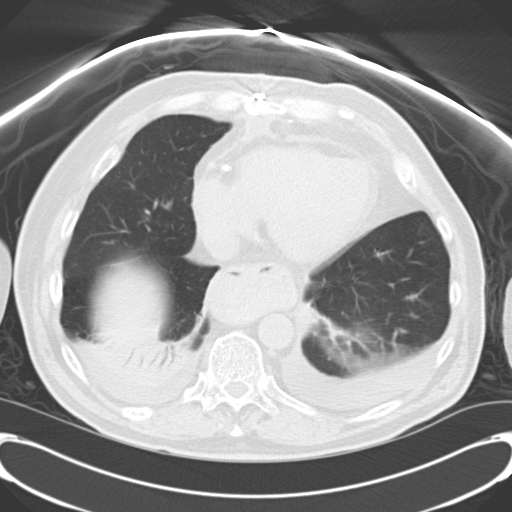
[im 23/62  mediastinal]
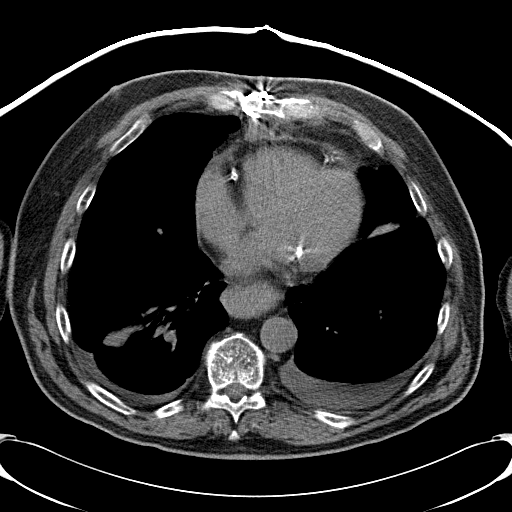
[im 23/62  lung]
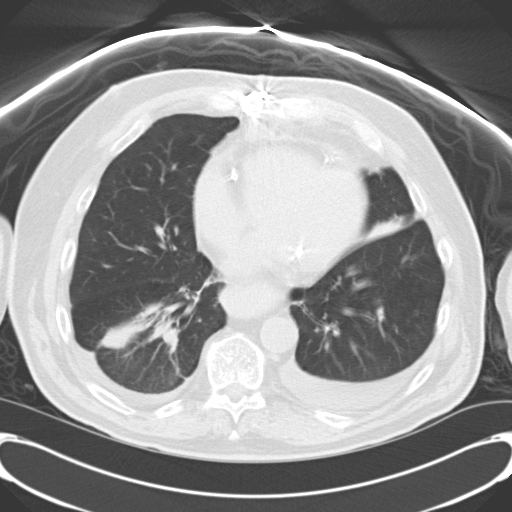
[im 28/62  lung]
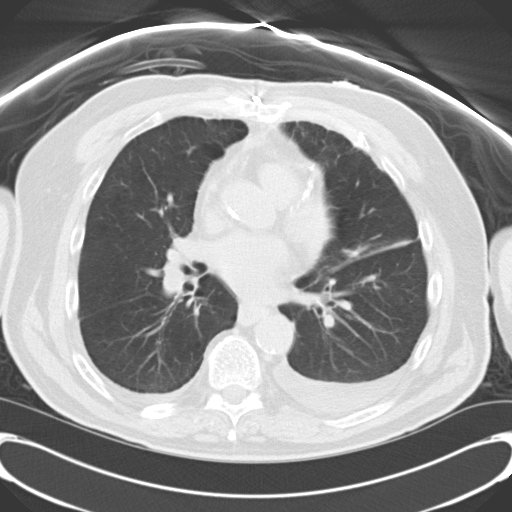
[im 34/62  lung]
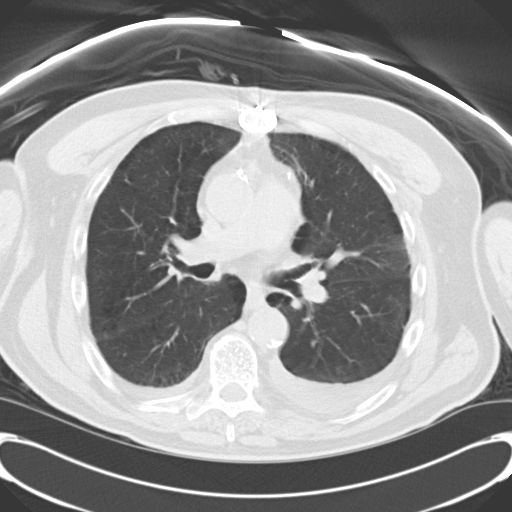
[im 39/62  lung]
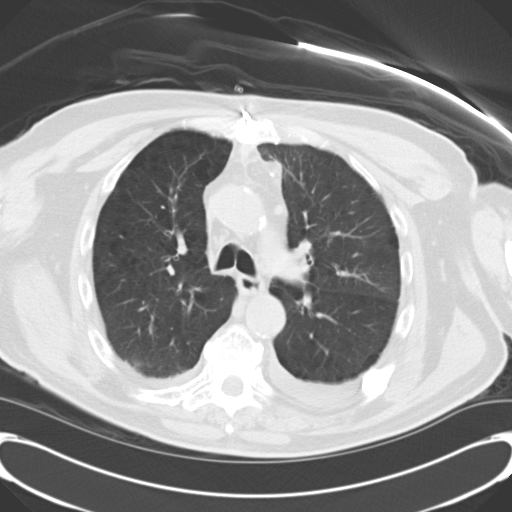
[im 43/62  mediastinal]
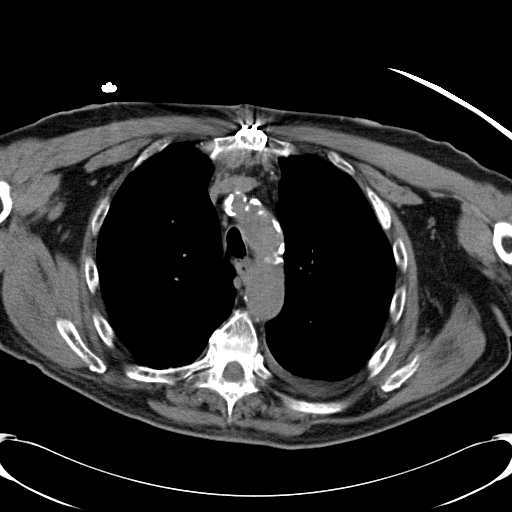
[im 43/62  lung]
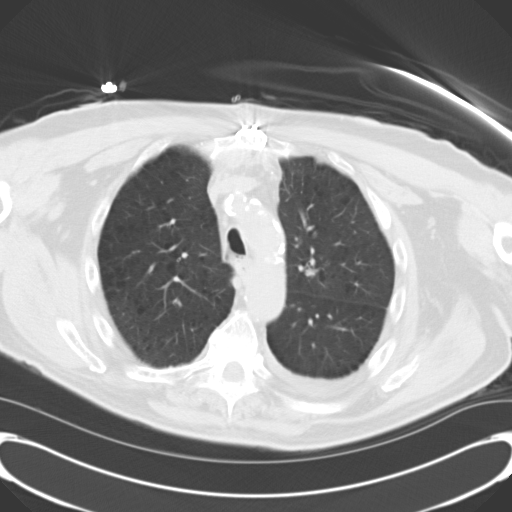
[im 48/62  lung]
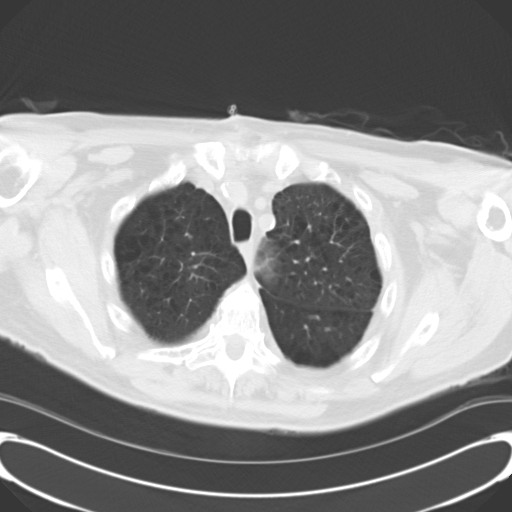
[im 52/62  lung]
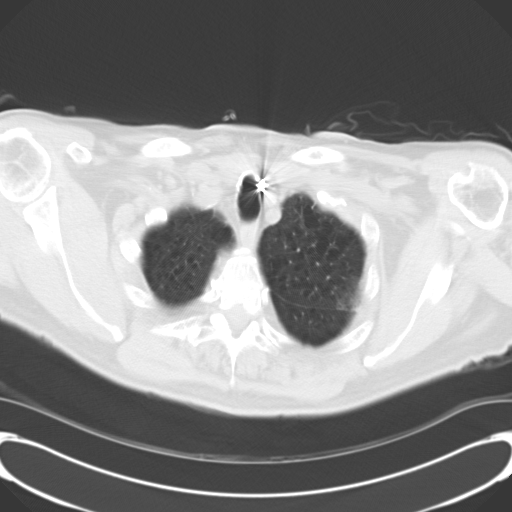
[im 57/62  lung]
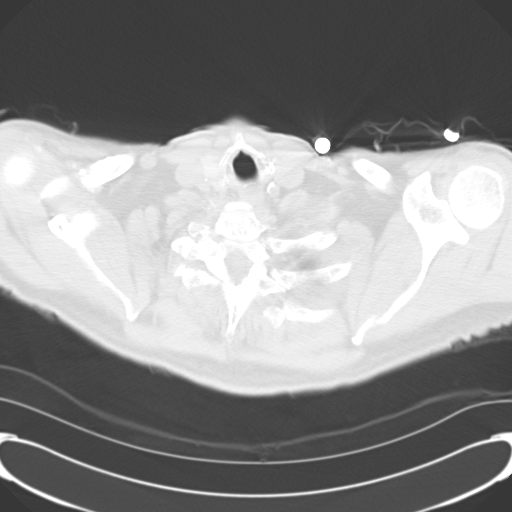

[Series 5: routine chest 3.0 st · coronal · 0.78mm/px · 3 of 85 slices shown]
[im 17/85  lung]
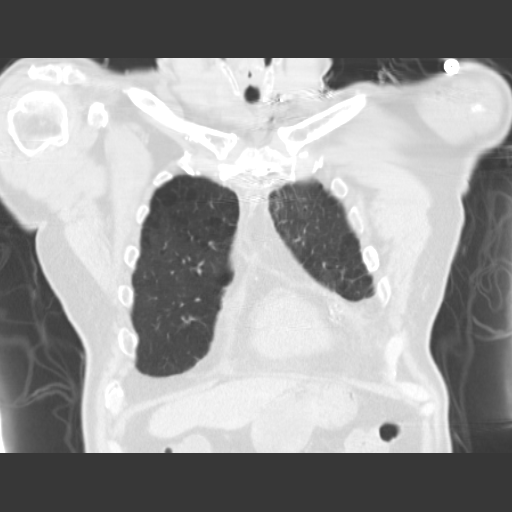
[im 34/85  lung]
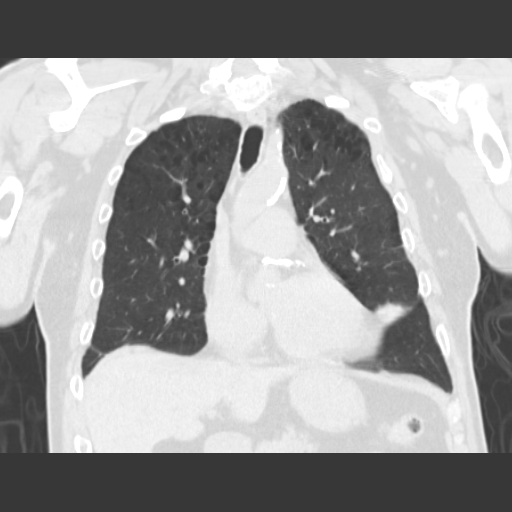
[im 51/85  lung]
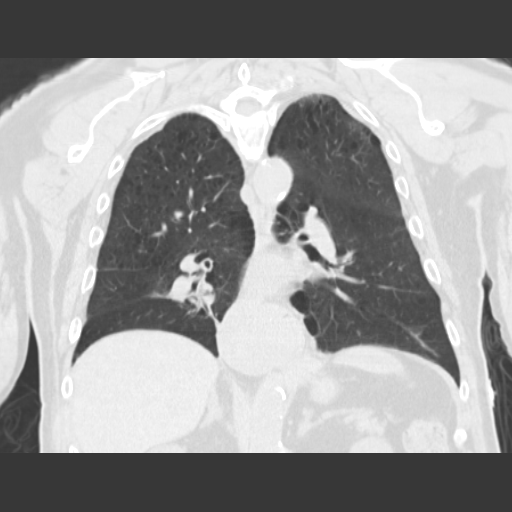

[15 of 36 positions shown; findings below may reference images not displayed]

FINDINGS: The patient has undergone a recent median sternotomy.
There is no evidence for sternal dehiscence.  There is no evidence
for an abscess.  A small amount of stranding is noted within the
mediastinum anteriorly related to the patient's recent surgery.
There are small (left greater than right) bilateral pleural
effusions.  There are right basilar atelectatic/infiltrative
changes present.  There is no mediastinal or hilar adenopathy.
There is no pneumothorax.  There is a moderate sized hiatal hernia
present.  The adrenal glands have a normal appearance.
IMPRESSION: 1.  Status post median sternotomy.  No evidence for sternal
dehiscence or an abscess.
2.  Small  bilateral pleural effusions and right basilar
atelectasis/infiltrate.
3.  Moderate sized hiatal hernia.

## 2011-03-16 ENCOUNTER — Ambulatory Visit (INDEPENDENT_AMBULATORY_CARE_PROVIDER_SITE_OTHER): Payer: Medicare Other | Admitting: Internal Medicine

## 2011-03-16 DIAGNOSIS — D518 Other vitamin B12 deficiency anemias: Secondary | ICD-10-CM

## 2011-03-18 ENCOUNTER — Encounter: Payer: Self-pay | Admitting: Cardiovascular Disease

## 2011-03-18 ENCOUNTER — Other Ambulatory Visit: Payer: Self-pay | Admitting: *Deleted

## 2011-03-18 ENCOUNTER — Ambulatory Visit (INDEPENDENT_AMBULATORY_CARE_PROVIDER_SITE_OTHER): Payer: Medicare Other | Admitting: Cardiovascular Disease

## 2011-03-18 DIAGNOSIS — J4489 Other specified chronic obstructive pulmonary disease: Secondary | ICD-10-CM

## 2011-03-18 DIAGNOSIS — R0602 Shortness of breath: Secondary | ICD-10-CM

## 2011-03-18 DIAGNOSIS — I5033 Acute on chronic diastolic (congestive) heart failure: Secondary | ICD-10-CM

## 2011-03-18 DIAGNOSIS — I2510 Atherosclerotic heart disease of native coronary artery without angina pectoris: Secondary | ICD-10-CM

## 2011-03-18 DIAGNOSIS — J449 Chronic obstructive pulmonary disease, unspecified: Secondary | ICD-10-CM

## 2011-03-18 LAB — BASIC METABOLIC PANEL
Calcium: 8.8 mg/dL (ref 8.4–10.5)
GFR: 75.68 mL/min (ref >60.00)
Potassium: 3.1 mEq/L — ABNORMAL LOW (ref 3.5–5.1)
Sodium: 142 mEq/L (ref 135–145)

## 2011-03-18 LAB — BRAIN NATRIURETIC PEPTIDE: Pro B Natriuretic peptide (BNP): 81 pg/mL (ref 0.0–100.0)

## 2011-03-18 MED ORDER — FUROSEMIDE 40 MG PO TABS: 40.0000 mg | ORAL_TABLET | Freq: Every day | ORAL | Status: DC

## 2011-03-18 MED ORDER — POTASSIUM CHLORIDE CRYS ER 20 MEQ PO TBCR: 20.0000 meq | EXTENDED_RELEASE_TABLET | Freq: Every day | ORAL | Status: DC

## 2011-03-18 MED ORDER — POTASSIUM CHLORIDE CRYS ER 20 MEQ PO TBCR: 20.0000 meq | EXTENDED_RELEASE_TABLET | Freq: Two times a day (BID) | ORAL | Status: DC

## 2011-03-18 NOTE — Progress Notes (Signed)
HPI:  This is a 70 year old gentleman presenting for followup of coronary artery disease with history of multiple PCI procedures. He underwent coronary bypass surgery in 2011. He also is followed for peripheral arterial disease with history of iliac stenting.  The patient also has significant COPD and has been most limited over the past year with shortness of breath.  An echocardiogram was done following his last office visit. This showed normal left ventricular systolic function without marked diastolic abnormalities. There was no significant valvular disease noted.  The patient continues to have difficulty breathing. He reports no change in his chronic shortness of breath with activity. He is limited to low-level exertion. He denies leg pain, chest pain, or palpitations. He reports no orthopnea, PND, or edema.  Outpatient Encounter Prescriptions as of 03/18/2011  Medication Sig Dispense Refill  . aspirin 81 MG tablet Take 81 mg by mouth daily.        . Calcium Carbonate-Vit D-Min (CALTRATE 600+D PLUS) 600-400 MG-UNIT per tablet Chew 1 tablet by mouth daily.        . cetirizine (ZYRTEC ALLERGY) 10 MG tablet Take 1 tablet (10 mg total) by mouth every morning.      . cyanocobalamin (,VITAMIN B-12,) 1000 MCG/ML injection Inject 1 mL (1,000 mcg total) into the muscle every 30 (thirty) days.      Marland Kitchen dextromethorphan-guaiFENesin (MUCINEX DM) 30-600 MG per 12 hr tablet Take 1 tablet by mouth every 12 (twelve) hours as needed. cough       . famotidine (PEPCID) 20 MG tablet Take 1 tablet (20 mg total) by mouth at bedtime.      . furosemide (LASIX) 20 MG tablet TAKE ONE TABLET BY MOUTH DAILY.  30 tablet  11  . levalbuterol (XOPENEX HFA) 45 MCG/ACT inhaler Inhale 2 puffs into the lungs every 4 (four) hours as needed.        Marland Kitchen losartan (COZAAR) 100 MG tablet Take 1 tablet (100 mg total) by mouth daily.  30 tablet  11  . Nebivolol HCl (BYSTOLIC) 20 MG TABS Take 1/2 tab by mouth every morning      . pantoprazole  (PROTONIX) 40 MG tablet Take 1 tablet (40 mg total) by mouth 2 (two) times daily.  60 tablet  5  . rosuvastatin (CRESTOR) 20 MG tablet daily. Take 1/2 tab by mouth every morning      . sildenafil (VIAGRA) 50 MG tablet Take 1 tablet (50 mg total) by mouth as needed for erectile dysfunction.  10 tablet  1  . tiotropium (SPIRIVA) 18 MCG inhalation capsule Place 1 capsule (18 mcg total) into inhaler and inhale daily.  30 capsule  3  . triamcinolone (KENALOG) 0.025 % ointment APPLY 1 A SMALL AMOUNT TO AFFECTED AREA TWICE A DAY  80 g  2  . triamcinolone (KENALOG) 0.1 % cream Apply as needed for rash      . DISCONTD: budesonide-formoterol (SYMBICORT) 80-4.5 MCG/ACT inhaler Inhale 2 puffs into the lungs 2 (two) times daily.      Marland Kitchen DISCONTD: Teriparatide, Recombinant, (FORTEO) 600 MCG/2.4ML SOLN Inject 20 mcg into the skin daily.         Facility-Administered Encounter Medications as of 03/18/2011  Medication Dose Route Frequency Provider Last Rate Last Dose  . cyanocobalamin ((VITAMIN B-12)) injection 1,000 mcg  1,000 mcg Intramuscular Q30 days Judie Petit, MD   1,000 mcg at 03/16/11 0831    Allergies  Allergen Reactions  . Ticlopidine Hcl     REACTION: swelling  Past Medical History  Diagnosis Date  . HYPERLIPIDEMIA 11/21/2006  . HYPERTENSION 11/21/2006  . MYOCARDIAL INFARCTION, HX OF 11/21/2006  . CORONARY ARTERY DISEASE 11/21/2006  . OSTEOPOROSIS 11/21/2006  . COLONIC POLYPS 05/21/2007  . COPD 05/21/2007  . HIATAL HERNIA 05/21/2007  . DIVERTICULAR DISEASE 05/21/2007  . NEPHROLITHIASIS 05/21/2007  . GI BLEEDING 09/09/2008  . ANEMIA, B12 DEFICIENCY 12/02/2008  . CAROTID ARTERY STENOSIS 04/16/2009  . PERIPHERAL VASCULAR DISEASE 09/02/2009  . Atrial fibrillation 11/06/2009  . Allergic rhinitis   . Osteoporosis   . Hemoptysis     ROS: Negative except as per HPI  BP 152/94  Pulse 72  Ht 5' 2.5" (1.588 m)  Wt 74.39 kg (164 lb)  BMI 29.52 kg/m2  PHYSICAL EXAM: Pt is alert and oriented,  NAD HEENT: normal Neck: JVP - normal, carotids 2+= without bruits Lungs: CTA bilaterally CV: RRR without murmur or gallop, distant Abd: soft, NT, Positive BS, no hepatomegaly Ext: no C/C/E Skin: warm/dry no rash  EKG:  Normal sinus rhythm 72 beats per minute, nonspecific T wave abnormality.  ASSESSMENT AND PLAN

## 2011-03-18 NOTE — Patient Instructions (Signed)
Your physician recommends that you schedule a follow-up appointment in: 3 months.   Your physician has recommended you make the following change in your medication: Increase furosemide to 40 mg by mouth twice daily for 3 days and then decrease to furosemide 40 mg by mouth daily. Start potassium 20 meq by mouth daily.  You had lab work done today

## 2011-03-30 NOTE — Assessment & Plan Note (Addendum)
The patient is stable without angina following coronary bypass surgery. Echocardiogram from earlier this year was reviewed as outlined. He has preserved LV function. He will continue on his aspirin, nebivolol, and ARB.

## 2011-03-30 NOTE — Assessment & Plan Note (Signed)
Suspect that most of his dyspnea is related to COPD. Will give him a trial of increased diuretic with furosemide 40 mg daily to see if he gains any symptomatic response to this.

## 2011-04-01 ENCOUNTER — Other Ambulatory Visit (INDEPENDENT_AMBULATORY_CARE_PROVIDER_SITE_OTHER): Payer: Medicare Other | Admitting: *Deleted

## 2011-04-01 DIAGNOSIS — I5033 Acute on chronic diastolic (congestive) heart failure: Secondary | ICD-10-CM | POA: Diagnosis not present

## 2011-04-01 LAB — BASIC METABOLIC PANEL
GFR: 79.21 mL/min (ref >60.00)
Potassium: 4.1 mEq/L (ref 3.5–5.1)
Sodium: 143 mEq/L (ref 135–145)

## 2011-04-15 ENCOUNTER — Ambulatory Visit (INDEPENDENT_AMBULATORY_CARE_PROVIDER_SITE_OTHER): Payer: Medicare Other | Admitting: Internal Medicine

## 2011-04-15 ENCOUNTER — Telehealth: Payer: Self-pay | Admitting: Family Medicine

## 2011-04-15 ENCOUNTER — Encounter: Payer: Self-pay | Admitting: Internal Medicine

## 2011-04-15 DIAGNOSIS — R0600 Dyspnea, unspecified: Secondary | ICD-10-CM

## 2011-04-15 DIAGNOSIS — R0609 Other forms of dyspnea: Secondary | ICD-10-CM

## 2011-04-15 DIAGNOSIS — I2510 Atherosclerotic heart disease of native coronary artery without angina pectoris: Secondary | ICD-10-CM | POA: Diagnosis not present

## 2011-04-15 DIAGNOSIS — E785 Hyperlipidemia, unspecified: Secondary | ICD-10-CM | POA: Diagnosis not present

## 2011-04-15 DIAGNOSIS — D518 Other vitamin B12 deficiency anemias: Secondary | ICD-10-CM | POA: Diagnosis not present

## 2011-04-15 DIAGNOSIS — J449 Chronic obstructive pulmonary disease, unspecified: Secondary | ICD-10-CM

## 2011-04-15 DIAGNOSIS — I10 Essential (primary) hypertension: Secondary | ICD-10-CM

## 2011-04-15 DIAGNOSIS — I739 Peripheral vascular disease, unspecified: Secondary | ICD-10-CM

## 2011-04-15 DIAGNOSIS — I509 Heart failure, unspecified: Secondary | ICD-10-CM

## 2011-04-15 DIAGNOSIS — R0989 Other specified symptoms and signs involving the circulatory and respiratory systems: Secondary | ICD-10-CM | POA: Diagnosis not present

## 2011-04-15 MED ORDER — AZITHROMYCIN 250 MG PO TABS: ORAL_TABLET | ORAL | Status: AC

## 2011-04-15 MED ORDER — MOMETASONE FURO-FORMOTEROL FUM 100-5 MCG/ACT IN AERO: 2.0000 | INHALATION_SPRAY | Freq: Two times a day (BID) | RESPIRATORY_TRACT | Status: DC

## 2011-04-15 NOTE — Patient Instructions (Signed)
Use oxygen therapy through the night as discussed  Limit your sodium (Salt) intake  Call or return to clinic prn if these symptoms worsen or fail to improve as anticipated.  Followup in 2 weeks (Dr Cato Mulligan)

## 2011-04-15 NOTE — Telephone Encounter (Signed)
Pulled from Triage vmail. Patient was sched. For B12 shot today. Wants to know if he can see the doc today and would like you to call him. Thanks.

## 2011-04-15 NOTE — Telephone Encounter (Signed)
Pt seen Dr K 

## 2011-04-15 NOTE — Progress Notes (Signed)
  Subjective:    Patient ID: [name], male    DOB: 06-Apr-1940, 71 y.o.   MRN: [id number]  HPI  71 year old patient who is seen today with a two-week history of cold symptoms with cough and congestion. He has been expectorating green sputum. There's been some intermittent fever and chills. He has complained of increasing shortness of breath especially during the night. He does have a home oximeter and states that O2 saturations during the night or 82-84%. He often awakes short of breath. He denies any increasing daytime shortness of breath. He has mild chronic edema that has been unchanged. He has been seen by cardiology fairly recently and electrolytes and BNP unremarkable. He does have a history of diastolic heart failure in the past. He has coronary artery disease status post prior interventions he has carotid and peripheral vascular disease as well as fairly advanced COPD.  Cardiology has increased his daily Lasix to 40 mg   Review of Systems  Constitutional: Positive for fever and chills. Negative for appetite change and fatigue.  HENT: Positive for congestion. Negative for hearing loss, ear pain, sore throat, trouble swallowing, neck stiffness, dental problem, voice change and tinnitus.   Eyes: Negative for pain, discharge and visual disturbance.  Respiratory: Positive for cough and shortness of breath. Negative for chest tightness, wheezing and stridor.   Cardiovascular: Negative for chest pain, palpitations and leg swelling.  Gastrointestinal: Negative for nausea, vomiting, abdominal pain, diarrhea, constipation, blood in stool and abdominal distention.  Genitourinary: Negative for urgency, hematuria, flank pain, discharge, difficulty urinating and genital sores.  Musculoskeletal: Negative for myalgias, back pain, joint swelling, arthralgias and gait problem.  Skin: Negative for rash.  Neurological: Negative for dizziness, syncope, speech difficulty, weakness, numbness and headaches.    Hematological: Negative for adenopathy. Does not bruise/bleed easily.  Psychiatric/Behavioral: Negative for behavioral problems and dysphoric mood. The patient is not nervous/anxious.        Objective:   Physical Exam  Constitutional: He is oriented to person, place, and time. He appears well-developed.       No acute distress Blood pressure 100/60  And O2 saturation after ambulation as low as 88% O2 saturation at rest 88- 90 percent  HENT:  Head: Normocephalic.  Right Ear: External ear normal.  Left Ear: External ear normal.  Eyes: Conjunctivae and EOM are normal.  Neck: Normal range of motion.  Cardiovascular: Normal rate, regular rhythm and normal heart sounds.   Pulmonary/Chest: Effort normal and breath sounds normal. No respiratory distress. He has no wheezes.       Rhonchi noted anteriorly. No rales or wheezes  Abdominal: Bowel sounds are normal.  Musculoskeletal: Normal range of motion. He exhibits no edema and no tenderness.  Neurological: He is alert and oriented to person, place, and time.  Psychiatric: He has a normal mood and affect. His behavior is normal.          Assessment & Plan:  Acute exacerbation of COPD. We'll place on azithromycin, add Dulera twice daily to his regimen. We'll also be treated with Mucinex History of diastolic heart failure. Suspect  his chief problem today is exacerbation of COPD we'll check a BNP Coronary artery disease Peripheral vascular disease  We'll set her for nocturnal home O2

## 2011-04-29 ENCOUNTER — Ambulatory Visit: Payer: Medicare Other | Admitting: Internal Medicine

## 2011-04-30 ENCOUNTER — Other Ambulatory Visit: Payer: Self-pay | Admitting: Internal Medicine

## 2011-05-03 ENCOUNTER — Ambulatory Visit (INDEPENDENT_AMBULATORY_CARE_PROVIDER_SITE_OTHER)
Admission: RE | Admit: 2011-05-03 | Discharge: 2011-05-03 | Disposition: A | Discharge status: Home / Self Care | Payer: Medicare Other | Source: Ambulatory Visit | Attending: Internal Medicine | Admitting: Internal Medicine

## 2011-05-03 ENCOUNTER — Ambulatory Visit (INDEPENDENT_AMBULATORY_CARE_PROVIDER_SITE_OTHER): Payer: Medicare Other | Admitting: Internal Medicine

## 2011-05-03 ENCOUNTER — Encounter: Payer: Self-pay | Admitting: Internal Medicine

## 2011-05-03 VITALS — BP 132/90 | HR 75 | Temp 97.7°F | Wt 162.0 lb

## 2011-05-03 DIAGNOSIS — J449 Chronic obstructive pulmonary disease, unspecified: Secondary | ICD-10-CM

## 2011-05-03 DIAGNOSIS — Z23 Encounter for immunization: Secondary | ICD-10-CM | POA: Diagnosis not present

## 2011-05-03 DIAGNOSIS — R05 Cough: Secondary | ICD-10-CM | POA: Diagnosis not present

## 2011-05-03 DIAGNOSIS — R0602 Shortness of breath: Secondary | ICD-10-CM | POA: Diagnosis not present

## 2011-05-03 NOTE — Progress Notes (Signed)
Patient ID: [name], male   DOB: 25-Nov-1940, 71 y.o.   MRN: [id number] F/u for SOB Reviewed Dr. Charm Rings note- Pt states that the O2 helps at home.  He notes that he initially felt better on the zpack but sxs seem to be recurring. Notes DOE, notes a hoarse voice. He notes a productive cough but no fever.   Past Medical History  Diagnosis Date  . HYPERLIPIDEMIA 11/21/2006  . HYPERTENSION 11/21/2006  . MYOCARDIAL INFARCTION, HX OF 11/21/2006  . CORONARY ARTERY DISEASE 11/21/2006  . OSTEOPOROSIS 11/21/2006  . COLONIC POLYPS 05/21/2007  . COPD 05/21/2007  . HIATAL HERNIA 05/21/2007  . DIVERTICULAR DISEASE 05/21/2007  . NEPHROLITHIASIS 05/21/2007  . GI BLEEDING 09/09/2008  . ANEMIA, B12 DEFICIENCY 12/02/2008  . CAROTID ARTERY STENOSIS 04/16/2009  . PERIPHERAL VASCULAR DISEASE 09/02/2009  . Atrial fibrillation 11/06/2009  . Allergic rhinitis   . Osteoporosis   . Hemoptysis     History   Social History  . Marital Status: Widowed    Spouse Name: N/A    Number of Children: N/A  . Years of Education: N/A   Occupational History  . retired from [employer] and [employer]    Social History Main Topics  . Smoking status: Former Smoker -- 3.0 packs/day for 50 years    Types: Cigarettes    Quit date: 09/25/2009  . Smokeless tobacco: Not on file  . Alcohol Use: Yes  . Drug Use: No  . Sexually Active: Not on file   Other Topics Concern  . Not on file   Social History Narrative   Regular exercise - no    Past Surgical History  Procedure Date  . Thyroidectomy   . Coronary stent placement     03/07/2001  . Cholecystectomy   . Appendectomy   . Hemorrhoid surgery   . Inguinal hernia repair 10/10/08  . Coronary artery bypass graft   . Ptca     Family History  Problem Relation Age of Onset  . Arthritis Mother     rheumatoid  . Heart attack Father   . Heart disease Father   . Heart disease Brother   . Colon cancer Neg Hx   . Heart disease Brother     Allergies  Allergen Reactions  .  Ticlopidine Hcl     REACTION: swelling    Current Outpatient Prescriptions on File Prior to Visit  Medication Sig Dispense Refill  . aspirin 81 MG tablet Take 81 mg by mouth daily.        . Calcium Carbonate-Vit D-Min (CALTRATE 600+D PLUS) 600-400 MG-UNIT per tablet Chew 1 tablet by mouth daily.        . cetirizine (ZYRTEC ALLERGY) 10 MG tablet Take 1 tablet (10 mg total) by mouth every morning.      . cyanocobalamin (,VITAMIN B-12,) 1000 MCG/ML injection Inject 1 mL (1,000 mcg total) into the muscle every 30 (thirty) days.      Marland Kitchen dextromethorphan-guaiFENesin (MUCINEX DM) 30-600 MG per 12 hr tablet Take 1 tablet by mouth every 12 (twelve) hours as needed. cough       . famotidine (PEPCID) 20 MG tablet Take 1 tablet (20 mg total) by mouth at bedtime.      . furosemide (LASIX) 40 MG tablet Take 1 tablet (40 mg total) by mouth daily.  30 tablet  6  . levalbuterol (XOPENEX HFA) 45 MCG/ACT inhaler Inhale 2 puffs into the lungs every 4 (four) hours as needed.        Marland Kitchen  losartan (COZAAR) 100 MG tablet TAKE 1 TABLET BY MOUTH EVERY DAY  30 tablet  11  . mometasone-formoterol (DULERA) 100-5 MCG/ACT AERO Inhale 2 puffs into the lungs 2 (two) times daily.  1 Inhaler  2  . Nebivolol HCl (BYSTOLIC) 20 MG TABS Take 1/2 tab by mouth every morning      . pantoprazole (PROTONIX) 40 MG tablet Take 1 tablet (40 mg total) by mouth 2 (two) times daily.  60 tablet  5  . potassium chloride SA (K-DUR,KLOR-CON) 20 MEQ tablet Take 1 tablet (20 mEq total) by mouth 2 (two) times daily.  60 tablet  6  . rosuvastatin (CRESTOR) 20 MG tablet daily. Take 1/2 tab by mouth every morning      . sildenafil (VIAGRA) 50 MG tablet Take 1 tablet (50 mg total) by mouth as needed for erectile dysfunction.  10 tablet  1  . tiotropium (SPIRIVA) 18 MCG inhalation capsule Place 1 capsule (18 mcg total) into inhaler and inhale daily.  30 capsule  3  . triamcinolone (KENALOG) 0.025 % ointment APPLY 1 A SMALL AMOUNT TO AFFECTED AREA TWICE A DAY   80 g  2  . triamcinolone (KENALOG) 0.1 % cream Apply as needed for rash       Current Facility-Administered Medications on File Prior to Visit  Medication Dose Route Frequency Provider Last Rate Last Dose  . cyanocobalamin ((VITAMIN B-12)) injection 1,000 mcg  1,000 mcg Intramuscular Q30 days Judie Petit, MD   1,000 mcg at 04/15/11 1127     patient denies chest pain, shortness of breath, orthopnea. Denies lower extremity edema, abdominal pain, change in appetite, change in bowel movements. Patient denies rashes, musculoskeletal complaints. No other specific complaints in a complete review of systems.   BP 132/90  Pulse 75  Temp(Src) 97.7 F (36.5 C) (Oral)  Wt 162 lb (73.483 kg)  SpO2 91%  well-developed well-nourished male in no acute distress. HEENT exam atraumatic, normocephalic, neck supple without jugular venous distention. Chest with poor air movement throughout.  cardiac exam S1-S2 are regular. Abdominal exam overweight with bowel sounds, soft and nontender. Extremities no edema. Neurologic exam is alert with a normal gait.

## 2011-05-03 NOTE — Assessment & Plan Note (Signed)
Reviewed overview section Note pulmonary f/u has not been scheduled Today I'll get a CXR---? Whether needs ABX/steroids or whether this is progression of COPD

## 2011-05-08 ENCOUNTER — Other Ambulatory Visit: Payer: Self-pay | Admitting: Internal Medicine

## 2011-05-28 ENCOUNTER — Other Ambulatory Visit: Payer: Self-pay | Admitting: Internal Medicine

## 2011-06-06 ENCOUNTER — Ambulatory Visit (INDEPENDENT_AMBULATORY_CARE_PROVIDER_SITE_OTHER): Payer: Medicare Other | Admitting: Internal Medicine

## 2011-06-06 DIAGNOSIS — D518 Other vitamin B12 deficiency anemias: Secondary | ICD-10-CM

## 2011-06-06 DIAGNOSIS — E538 Deficiency of other specified B group vitamins: Secondary | ICD-10-CM | POA: Diagnosis not present

## 2011-06-08 ENCOUNTER — Encounter: Payer: Self-pay | Admitting: Internal Medicine

## 2011-06-13 ENCOUNTER — Encounter: Payer: Self-pay | Admitting: Internal Medicine

## 2011-06-16 ENCOUNTER — Encounter: Payer: Self-pay | Admitting: Cardiovascular Disease

## 2011-06-16 ENCOUNTER — Ambulatory Visit (INDEPENDENT_AMBULATORY_CARE_PROVIDER_SITE_OTHER): Payer: Medicare Other | Admitting: Cardiovascular Disease

## 2011-06-16 VITALS — BP 138/82 | HR 68 | Ht 63.0 in | Wt 164.1 lb

## 2011-06-16 DIAGNOSIS — E7800 Pure hypercholesterolemia, unspecified: Secondary | ICD-10-CM

## 2011-06-16 DIAGNOSIS — I739 Peripheral vascular disease, unspecified: Secondary | ICD-10-CM

## 2011-06-16 DIAGNOSIS — I10 Essential (primary) hypertension: Secondary | ICD-10-CM

## 2011-06-16 DIAGNOSIS — I2510 Atherosclerotic heart disease of native coronary artery without angina pectoris: Secondary | ICD-10-CM | POA: Diagnosis not present

## 2011-06-16 DIAGNOSIS — R55 Syncope and collapse: Secondary | ICD-10-CM | POA: Insufficient documentation

## 2011-06-16 LAB — BASIC METABOLIC PANEL
BUN: 14 mg/dL (ref 6–23)
Chloride: 99 mEq/L (ref 96–112)
Creatinine, Ser: 1 mg/dL (ref 0.4–1.5)

## 2011-06-16 LAB — HEPATIC FUNCTION PANEL
AST: 18 U/L (ref 0–37)
Albumin: 3.9 g/dL (ref 3.5–5.2)
Alkaline Phosphatase: 42 U/L (ref 39–117)
Total Protein: 7.1 g/dL (ref 6.0–8.3)

## 2011-06-16 NOTE — Assessment & Plan Note (Signed)
I'm very concerned about the patient's near syncopal episodes. The abrupt onset and severity of the symptoms is suggestive of cardiac arrhythmia. His 12-lead EKG does not show much conduction disease, but he clearly needs further evaluation. He's had normal LV function so I think ventricular arrhythmia is unlikely. Will check a 21 day event recorder for further evaluation. If the patient has progressive symptoms or if he has syncope he was instructed to call EMS.

## 2011-06-16 NOTE — Progress Notes (Signed)
HPI:  [name] is a 71 year old gentleman presenting for followup evaluation. The patient has coronary and peripheral arterial disease. He has undergone iliac stenting for his PAD.  He's had no claudication symptoms over the past few years. The patient underwent coronary bypass surgery in 2011. Over the past year he has had progressive dyspnea attributed to COPD. I did an echo after his last office visit and there were no significant abnormalities noted. Specifically, he had normal LV systolic function, no major diastolic abnormalities, and no significant valvular disease noted.  The patient continues to have dyspnea but he does not think this has worsened over recent months. He denies edema, orthopnea, or PND.  He has developed episodes of near syncope. These have occurred over the last 2 months and he reports about 6 episodes in total. He describes sudden onset of a feeling of marked lightheadedness and weakness associated with shortness of breath. When this happens he take some slow breaths and it slowly resolved over a matter of several seconds up to a few minutes. He has never experienced these sensations in the past. He has not had complete loss of consciousness but has been close to passing out. He has checked his heart rate on a home blood pressure cuff and it has been as low as the 20s and as high as the 170s. When he is feeling well his heart rate always runs in the 60s and 70s by his report.  Outpatient Encounter Prescriptions as of 06/16/2011  Medication Sig Dispense Refill  . aspirin 81 MG tablet Take 81 mg by mouth daily.        . Calcium Carbonate-Vit D-Min (CALTRATE 600+D PLUS) 600-400 MG-UNIT per tablet Chew 1 tablet by mouth daily.        . cetirizine (ZYRTEC ALLERGY) 10 MG tablet Take 1 tablet (10 mg total) by mouth every morning.      Marland Kitchen CRESTOR 20 MG tablet TAKE 1 TABLET BY MOUTH EVERY DAY  30 tablet  5  . cyanocobalamin (,VITAMIN B-12,) 1000 MCG/ML injection Inject 1 mL (1,000 mcg  total) into the muscle every 30 (thirty) days.      Marland Kitchen dextromethorphan-guaiFENesin (MUCINEX DM) 30-600 MG per 12 hr tablet Take 1 tablet by mouth every 12 (twelve) hours as needed. cough       . famotidine (PEPCID) 20 MG tablet Take 1 tablet (20 mg total) by mouth at bedtime.      . furosemide (LASIX) 40 MG tablet Take 1 tablet (40 mg total) by mouth daily.  30 tablet  6  . losartan (COZAAR) 100 MG tablet TAKE 1 TABLET BY MOUTH EVERY DAY  30 tablet  11  . mometasone-formoterol (DULERA) 100-5 MCG/ACT AERO Inhale 2 puffs into the lungs 2 (two) times daily.  1 Inhaler  2  . Nebivolol HCl (BYSTOLIC) 20 MG TABS Take 1/2 tab by mouth every morning      . pantoprazole (PROTONIX) 40 MG tablet Take 1 tablet (40 mg total) by mouth 2 (two) times daily.  60 tablet  5  . potassium chloride SA (K-DUR,KLOR-CON) 20 MEQ tablet Take 1 tablet (20 mEq total) by mouth 2 (two) times daily.  60 tablet  6  . rosuvastatin (CRESTOR) 20 MG tablet daily. Take 1/2 tab by mouth every morning      . sildenafil (VIAGRA) 50 MG tablet Take 1 tablet (50 mg total) by mouth as needed for erectile dysfunction.  10 tablet  1  . tiotropium (SPIRIVA) 18 MCG  inhalation capsule Place 1 capsule (18 mcg total) into inhaler and inhale daily.  30 capsule  3  . triamcinolone (KENALOG) 0.025 % ointment APPLY 1 A SMALL AMOUNT TO AFFECTED AREA TWICE A DAY  80 g  2  . triamcinolone (KENALOG) 0.1 % cream Apply as needed for rash      . XOPENEX HFA 45 MCG/ACT inhaler INHALE 2 PUFFS BY MOUTH EVERY 4 HOURS AS NEEDED  1 Inhaler  0   Facility-Administered Encounter Medications as of 06/16/2011  Medication Dose Route Frequency Provider Last Rate Last Dose  . cyanocobalamin ((VITAMIN B-12)) injection 1,000 mcg  1,000 mcg Intramuscular Q30 days Lindley Magnus, MD   1,000 mcg at 06/06/11 0913    Allergies  Allergen Reactions  . Ticlopidine Hcl     REACTION: swelling    Past Medical History  Diagnosis Date  . HYPERLIPIDEMIA 11/21/2006  . HYPERTENSION  11/21/2006  . MYOCARDIAL INFARCTION, HX OF 11/21/2006  . CORONARY ARTERY DISEASE 11/21/2006  . OSTEOPOROSIS 11/21/2006  . COLONIC POLYPS 05/21/2007  . COPD 05/21/2007  . HIATAL HERNIA 05/21/2007  . DIVERTICULAR DISEASE 05/21/2007  . NEPHROLITHIASIS 05/21/2007  . GI BLEEDING 09/09/2008  . ANEMIA, B12 DEFICIENCY 12/02/2008  . CAROTID ARTERY STENOSIS 04/16/2009  . PERIPHERAL VASCULAR DISEASE 09/02/2009  . Atrial fibrillation 11/06/2009  . Allergic rhinitis   . Osteoporosis   . Hemoptysis     ROS: Negative except as per HPI  BP 138/82  Pulse 68  Ht 5\' 3"  (1.6 m)  Wt 74.444 kg (164 lb 1.9 oz)  BMI 29.07 kg/m2  PHYSICAL EXAM: Pt is alert and oriented, elderly appearing male in NAD HEENT: normal Neck: JVP - normal, carotids 2+= without bruits Lungs: Diminished breath sounds bilaterally CV: RRR without murmur or gallop Abd: soft, NT, Positive BS, obese Ext: no C/C/E, distal pulses intact and equal Skin: warm/dry no rash  EKG:  Normal sinus rhythm 68 beats per minute, RSR prime pattern suggestive of RV conduction delay, cannot rule out age-indeterminate anterior infarct.  ASSESSMENT AND PLAN:

## 2011-06-16 NOTE — Assessment & Plan Note (Signed)
The patient is stable without angina. He will continue on his current medical program. 

## 2011-06-16 NOTE — Assessment & Plan Note (Signed)
Stable without claudication symptoms. Peripheral pulse exam normal.

## 2011-06-16 NOTE — Patient Instructions (Signed)
Your physician wants you to follow-up in: 6 months. You will receive a reminder letter in the mail two months in advance. If you don't receive a letter, please call our office to schedule the follow-up appointment.  Your physician has recommended that you wear an event monitor. Event monitors are medical devices that record the heart's electrical activity. Doctors most often [place] these monitors to diagnose arrhythmias. Arrhythmias are problems with the speed or rhythm of the heartbeat. The monitor is a small, portable device. You can wear one while you do your normal daily activities. This is usually used to diagnose what is causing palpitations/syncope (passing out).   You had lab work done today.

## 2011-06-16 NOTE — Assessment & Plan Note (Signed)
Blood pressure is well controlled on combination of losartan and Bystolic.

## 2011-06-17 ENCOUNTER — Encounter (INDEPENDENT_AMBULATORY_CARE_PROVIDER_SITE_OTHER): Payer: Medicare Other

## 2011-06-17 DIAGNOSIS — R55 Syncope and collapse: Secondary | ICD-10-CM

## 2011-06-21 DIAGNOSIS — H2510 Age-related nuclear cataract, unspecified eye: Secondary | ICD-10-CM | POA: Diagnosis not present

## 2011-06-21 DIAGNOSIS — H524 Presbyopia: Secondary | ICD-10-CM | POA: Diagnosis not present

## 2011-06-21 DIAGNOSIS — H3530 Unspecified macular degeneration: Secondary | ICD-10-CM | POA: Diagnosis not present

## 2011-06-21 DIAGNOSIS — H5200 Hypermetropia, unspecified eye: Secondary | ICD-10-CM | POA: Diagnosis not present

## 2011-07-07 ENCOUNTER — Ambulatory Visit (INDEPENDENT_AMBULATORY_CARE_PROVIDER_SITE_OTHER): Payer: Medicare Other | Admitting: Internal Medicine

## 2011-07-07 DIAGNOSIS — E538 Deficiency of other specified B group vitamins: Secondary | ICD-10-CM

## 2011-07-07 DIAGNOSIS — D518 Other vitamin B12 deficiency anemias: Secondary | ICD-10-CM | POA: Diagnosis not present

## 2011-07-15 ENCOUNTER — Telehealth: Payer: Self-pay | Admitting: Cardiovascular Disease

## 2011-07-15 NOTE — Telephone Encounter (Signed)
New Problem:     Patient returned your phone call.  Please call back and feel free to call his cell number if he was not available.

## 2011-07-15 NOTE — Telephone Encounter (Signed)
Pt aware of monitor results. 

## 2011-07-26 DIAGNOSIS — H26499 Other secondary cataract, unspecified eye: Secondary | ICD-10-CM | POA: Diagnosis not present

## 2011-07-26 DIAGNOSIS — Z961 Presence of intraocular lens: Secondary | ICD-10-CM | POA: Diagnosis not present

## 2011-08-08 ENCOUNTER — Ambulatory Visit (INDEPENDENT_AMBULATORY_CARE_PROVIDER_SITE_OTHER): Payer: Medicare Other | Admitting: Internal Medicine

## 2011-08-08 DIAGNOSIS — E538 Deficiency of other specified B group vitamins: Secondary | ICD-10-CM | POA: Diagnosis not present

## 2011-08-08 DIAGNOSIS — D518 Other vitamin B12 deficiency anemias: Secondary | ICD-10-CM

## 2011-08-17 ENCOUNTER — Ambulatory Visit (AMBULATORY_SURGERY_CENTER): Payer: Medicare Other | Admitting: *Deleted

## 2011-08-17 VITALS — Ht 66.0 in | Wt 164.3 lb

## 2011-08-17 DIAGNOSIS — Z1211 Encounter for screening for malignant neoplasm of colon: Secondary | ICD-10-CM

## 2011-08-17 MED ORDER — PEG-KCL-NACL-NASULF-NA ASC-C 100 G PO SOLR: ORAL | Status: DC

## 2011-08-17 NOTE — Progress Notes (Signed)
[name]:  Last colonoscopy 08/2008.  Due for recall colon.  Pt had heart bypass in 2011 (since last colonoscopy)  He also has COPD and began using oxygen PRN recently.  Pt says he uses oxygen about once a week.  He also says his COPD has worsened over the last 4 months.  Colonoscopy scheduled for 6/5 cancelled and office visit with Dr. Juanda Chance scheduled for 5/29 at 10:30 am. [name]

## 2011-08-18 ENCOUNTER — Encounter: Payer: Self-pay | Admitting: *Deleted

## 2011-08-24 ENCOUNTER — Ambulatory Visit (INDEPENDENT_AMBULATORY_CARE_PROVIDER_SITE_OTHER): Payer: Medicare Other | Admitting: Internal Medicine

## 2011-08-24 ENCOUNTER — Encounter: Payer: Self-pay | Admitting: Internal Medicine

## 2011-08-24 VITALS — BP 132/68 | HR 62 | Ht 64.0 in | Wt 165.0 lb

## 2011-08-24 DIAGNOSIS — D689 Coagulation defect, unspecified: Secondary | ICD-10-CM | POA: Diagnosis not present

## 2011-08-24 DIAGNOSIS — Z86010 Personal history of colonic polyps: Secondary | ICD-10-CM

## 2011-08-24 NOTE — Progress Notes (Signed)
[name] February 08, 1941 MRN [id number]   History of Present Illness:  This is a 71 year old white male who is here to discuss a recall colonoscopy. He has a history of coronary artery disease and underwent a coronary artery bypass graft in July 2011. His Plavix was discontinued after the bypass. He has been followed by Dr. Excell Seltzer. He had an event monitor recently for 21 days showing rare PVC bigemini, and normal LV function. There is a history of multiple adenomatous polyps of the colon. His last colonoscopy was in June 2010.  Prior colonoscopies were completed in 1999, 2002, 2005. He denies constipation or rectal bleeding. He is having 2-3 bowel movements a day.   Past Medical History  Diagnosis Date  . HYPERLIPIDEMIA 11/21/2006  . HYPERTENSION 11/21/2006  . MYOCARDIAL INFARCTION, HX OF 11/21/2006  . CORONARY ARTERY DISEASE 11/21/2006  . OSTEOPOROSIS 11/21/2006  . COLONIC POLYPS 05/21/2007  . COPD 05/21/2007  . HIATAL HERNIA 05/21/2007  . DIVERTICULAR DISEASE 05/21/2007  . NEPHROLITHIASIS 05/21/2007  . GI BLEEDING 09/09/2008  . ANEMIA, B12 DEFICIENCY 12/02/2008  . CAROTID ARTERY STENOSIS 04/16/2009  . PERIPHERAL VASCULAR DISEASE 09/02/2009  . Atrial fibrillation 11/06/2009  . Allergic rhinitis   . Osteoporosis   . Hemoptysis   . Internal hemorrhoids   . GERD (gastroesophageal reflux disease)    Past Surgical History  Procedure Date  . Thyroidectomy   . Coronary stent placement     03/07/2001  . Cholecystectomy   . Appendectomy   . Hemorrhoid surgery   . Inguinal hernia repair 10/10/08  . Coronary artery bypass graft 2011  . Ptca     reports that he quit smoking about 22 months ago. His smoking use included Cigarettes. He has a 150 pack-year smoking history. He has never used smokeless tobacco. He reports that he drinks about 3 ounces of alcohol per week. He reports that he does not use illicit drugs. family history includes Arthritis in his mother; Colon cancer (age of onset:68) in his  brother; Heart attack in his father; and Heart disease in his brothers and father.  There is no history of Stomach cancer. Allergies  Allergen Reactions  . Ticlopidine Hcl     REACTION: swelling        Review of Systems: Denies reflux. Dysphagia shortness of breath chest pain  The remainder of the 10 point ROS is negative except as outlined in H&P   Physical Exam: General appearance  Well developed, in no distress. Eyes- non icteric. HEENT nontraumatic, normocephalic. Mouth no lesions, tongue papillated, no cheilosis. Neck supple without adenopathy, thyroid not enlarged, no carotid bruits, no JVD. Lungs Clear to auscultation bilaterally. Well-healed post thoracotomy scar Cor normal S1, normal S2, regular rhythm, no murmur,  quiet precordium. Abdomen: Mildly protuberant. Voluntary guarding. No tenderness. Small ventral hernia in subxiphoid area. Normal active bowel sounds.  Rectal: Hemoccult negative stool. Extremities no pedal edema. Skin no lesions. Neurological alert and oriented x 3. Psychological normal mood and affect.  Assessment and Plan:  Problem #89 71 year old gentleman who is due for a recall colonoscopy. Since his last exam, he has undergone bypass surgery and has been taken off Plavix. We will go ahead and schedule him for a repeat colonoscopy. He was instructed in the prep.  Problem #2 Coronary artery bypass graft. Coronary artery disease is currently normal left ventricular function. Patient has a followup appointment with Dr. Excell Seltzer in 6 months.  08/24/2011 [name]

## 2011-08-24 NOTE — Patient Instructions (Addendum)
You have been scheduled for a colonoscopy with propofol. Please follow written instructions given to you at your visit today.  You already have moviprep at the pharmacy (from when you were scheduled for procedure previously) CC: Dr Birdie Sons, Dr Excell Seltzer

## 2011-08-27 ENCOUNTER — Other Ambulatory Visit: Payer: Self-pay | Admitting: Internal Medicine

## 2011-08-30 ENCOUNTER — Ambulatory Visit (INDEPENDENT_AMBULATORY_CARE_PROVIDER_SITE_OTHER): Payer: Medicare Other | Admitting: Internal Medicine

## 2011-08-30 ENCOUNTER — Other Ambulatory Visit: Payer: Self-pay | Admitting: Internal Medicine

## 2011-08-30 DIAGNOSIS — D518 Other vitamin B12 deficiency anemias: Secondary | ICD-10-CM | POA: Diagnosis not present

## 2011-08-30 MED ORDER — CYANOCOBALAMIN 1000 MCG/ML IJ SOLN
1000.0000 ug | Freq: Once | INTRAMUSCULAR | Status: AC
  Administered 2011-08-30: 1000 ug via INTRAMUSCULAR

## 2011-08-31 ENCOUNTER — Encounter: Payer: Medicare Other | Admitting: Internal Medicine

## 2011-09-04 ENCOUNTER — Other Ambulatory Visit: Payer: Self-pay | Admitting: Internal Medicine

## 2011-09-26 ENCOUNTER — Ambulatory Visit (INDEPENDENT_AMBULATORY_CARE_PROVIDER_SITE_OTHER): Payer: Medicare Other | Admitting: *Deleted

## 2011-09-26 DIAGNOSIS — E538 Deficiency of other specified B group vitamins: Secondary | ICD-10-CM

## 2011-09-26 MED ORDER — CYANOCOBALAMIN 1000 MCG/ML IJ SOLN
1000.0000 ug | Freq: Once | INTRAMUSCULAR | Status: AC
  Administered 2011-09-26: 1000 ug via INTRAMUSCULAR

## 2011-10-01 ENCOUNTER — Other Ambulatory Visit: Payer: Self-pay | Admitting: Cardiovascular Disease

## 2011-10-17 ENCOUNTER — Other Ambulatory Visit: Payer: Self-pay | Admitting: Internal Medicine

## 2011-10-17 ENCOUNTER — Telehealth: Payer: Self-pay | Admitting: Internal Medicine

## 2011-10-17 MED ORDER — LEVALBUTEROL TARTRATE 45 MCG/ACT IN AERO: 2.0000 | INHALATION_SPRAY | RESPIRATORY_TRACT | Status: DC | PRN

## 2011-10-17 NOTE — Telephone Encounter (Signed)
RX has been called in and pt aware he must keep appt for refills. Nothing further was needed

## 2011-10-17 NOTE — Telephone Encounter (Signed)
Pt called back again re: same. [name]  °

## 2011-10-21 ENCOUNTER — Encounter: Payer: Self-pay | Admitting: Internal Medicine

## 2011-10-21 ENCOUNTER — Ambulatory Visit (AMBULATORY_SURGERY_CENTER): Payer: Medicare Other | Admitting: Internal Medicine

## 2011-10-21 VITALS — BP 125/86 | HR 87 | Temp 96.4°F | Resp 17 | Ht 64.0 in | Wt 165.0 lb

## 2011-10-21 DIAGNOSIS — D126 Benign neoplasm of colon, unspecified: Secondary | ICD-10-CM

## 2011-10-21 DIAGNOSIS — Z1211 Encounter for screening for malignant neoplasm of colon: Secondary | ICD-10-CM | POA: Diagnosis not present

## 2011-10-21 DIAGNOSIS — Z86010 Personal history of colonic polyps: Secondary | ICD-10-CM

## 2011-10-21 DIAGNOSIS — I2510 Atherosclerotic heart disease of native coronary artery without angina pectoris: Secondary | ICD-10-CM | POA: Diagnosis not present

## 2011-10-21 MED ORDER — SODIUM CHLORIDE 0.9 % IV SOLN: 500.0000 mL | INTRAVENOUS | Status: DC

## 2011-10-21 NOTE — Progress Notes (Signed)
PATIENT WITH EXPIRATORY WHEEZING. PATIENT STATING THIS IS HIS NORMAL. PATIENT STATING HE HAS HOME 02. SA02 98%. NO DYSPNEA OR CHEST PAIN. PATIENT SEEN BY Cathlyn Parsons CRNA AT [phone]. CLEARED FOR DISCHARGE. PATIENT USED HIS XOPENEX INHALER PRIOR TO DC.

## 2011-10-21 NOTE — Progress Notes (Signed)
Patient did not have preoperative order for IV antibiotic SSI prophylaxis. (G8918)  Patient did not experience any of the following events: a burn prior to discharge; a fall within the facility; wrong site/side/patient/procedure/implant event; or a hospital transfer or hospital admission upon discharge from the facility. (G8907)  

## 2011-10-21 NOTE — Op Note (Signed)
[facility] [address] [place], [place]  [address]  COLONOSCOPY PROCEDURE REPORT  PATIENT:  Jeffery, Newman  MR#:  [id number] BIRTHDATE:  01/12/1941, 71 yrs. old  GENDER:  male ENDOSCOPIST:  Hedwig Morton. Juanda Chance, MD REF. BY: PROCEDURE DATE:  10/21/2011 PROCEDURE:  Colonoscopy with biopsy ASA CLASS:  Class III INDICATIONS:  history of pre-cancerous (adenomatous) colon polyps adenom. polyps in 1999,2002,2005,2010 MEDICATIONS:   MAC sedation, administered by CRNA, propofol (Diprivan) 140 mg  DESCRIPTION OF PROCEDURE:   After the risks and benefits and of the procedure were explained, informed consent was obtained. Digital rectal exam was performed and revealed no rectal masses. The LB [id number] endoscope was introduced through the anus and advanced to the cecum, which was identified by both the appendix and ileocecal valve.  The quality of the prep was good, using MoviPrep.  The instrument was then slowly withdrawn as the colon was fully examined. <<PROCEDUREIMAGES>>  FINDINGS:  Four polyps were found in the rectum and sigmoid colon. at 0-15 cm four 2-3 mm polyps The polyps were removed using cold biopsy forceps (see image8, image7, and image9).  Mild diverticulosis was found in the sigmoid to descending colon segments (see image6, image5, and image1).  This was otherwise a normal examination of the colon (see image2, image3, image4, and image9).  Internal Hemorrhoids were found.   Retroflexed views in the rectum revealed no abnormalities.    The scope was then withdrawn from the patient and the procedure completed.  COMPLICATIONS:  None ENDOSCOPIC IMPRESSION: 1) Four polyps in the rectum and sigmoid colon 2) Mild diverticulosis in the sigmoid to descending colon segments 3) Otherwise normal examination 4) Internal hemorrhoids RECOMMENDATIONS: 1) Await pathology results 2) High fiber diet.  REPEAT EXAM:  In 0 year(s) for. recall 10 years if he is having any  problems, otherwise no recall due to age  ______________________________ Hedwig Morton. Juanda Chance, MD  CC:  n. eSIGNED:   Hedwig Morton. Nathin Saran at 10/21/2011 08:35 AM  [name], [id number]

## 2011-10-21 NOTE — Patient Instructions (Addendum)
Review your instructions given post procedure including: Discharge instructions YOU HAD AN ENDOSCOPIC PROCEDURE TODAY AT THE [facility]: Refer to the procedure report that was given to you for any specific questions about what was found during the examination.  If the procedure report does not answer your questions, please call your gastroenterologist to clarify.  If you requested that your care partner not be given the details of your procedure findings, then the procedure report has been included in a sealed envelope for you to review at your convenience later.  YOU SHOULD EXPECT: Some feelings of bloating in the abdomen. Passage of more gas than usual.  Walking can help get rid of the air that was put into your GI tract during the procedure and reduce the bloating. If you had a lower endoscopy (such as a colonoscopy or flexible sigmoidoscopy) you may notice spotting of blood in your stool or on the toilet paper. If you underwent a bowel prep for your procedure, then you may not have a normal bowel movement for a few days.  DIET: Your first meal following the procedure should be a light meal and then it is ok to progress to your normal diet.  A half-sandwich or bowl of soup is an example of a good first meal.  Heavy or fried foods are harder to digest and may make you feel nauseous or bloated.  Likewise meals heavy in dairy and vegetables can cause extra gas to form and this can also increase the bloating.  Drink plenty of fluids but you should avoid alcoholic beverages for 24 hours.  ACTIVITY: Your care partner should take you home directly after the procedure.  You should plan to take it easy, moving slowly for the rest of the day.  You can resume normal activity the day after the procedure however you should NOT DRIVE or use heavy machinery for 24 hours (because of the sedation medicines used during the test).    SYMPTOMS TO REPORT IMMEDIATELY: A gastroenterologist can be reached at any  hour.  During normal business hours, 8:30 AM to 5:00 PM Monday through Friday, call [phone].  After hours and on weekends, please call the GI answering service at [phone] who will take a message and have the physician on call contact you.   Following lower endoscopy (colonoscopy or flexible sigmoidoscopy):  Excessive amounts of blood in the stool  Significant tenderness or worsening of abdominal pains  Swelling of the abdomen that is new, acute  Fever of 100F or higher  Following upper endoscopy (EGD)  Vomiting of blood or coffee ground material  New chest pain or pain under the shoulder blades  Painful or persistently difficult swallowing  New shortness of breath  Fever of 100F or higher  Black, tarry-looking stools  FOLLOW UP: If any biopsies were taken you will be contacted by phone or by letter within the next 1-3 weeks.  Call your gastroenterologist if you have not heard about the biopsies in 3 weeks.  Our staff will call the home number listed on your records the next business day following your procedure to check on you and address any questions or concerns that you may have at that time regarding the information given to you following your procedure. This is a courtesy call and so if there is no answer at the home number and we have not heard from you through the emergency physician on call, we will assume that you have returned to your regular daily  activities without incident.  SIGNATURES/CONFIDENTIALITY: You and/or your care partner have signed paperwork which will be entered into your electronic medical record.  These signatures attest to the fact that that the information above on your After Visit Summary has been reviewed and is understood.  Full responsibility of the confidentiality of this discharge information lies with you and/or your care-partner.  

## 2011-10-22 ENCOUNTER — Other Ambulatory Visit: Payer: Self-pay | Admitting: Cardiovascular Disease

## 2011-10-24 ENCOUNTER — Telehealth: Payer: Self-pay | Admitting: *Deleted

## 2011-10-24 NOTE — Telephone Encounter (Signed)
  Follow up Call-  Call back number 10/21/2011  Post procedure Call Back phone  # [phone]  Permission to leave phone message Yes     Left message.

## 2011-10-25 ENCOUNTER — Encounter: Payer: Self-pay | Admitting: Internal Medicine

## 2011-10-27 ENCOUNTER — Ambulatory Visit (INDEPENDENT_AMBULATORY_CARE_PROVIDER_SITE_OTHER): Payer: Medicare Other | Admitting: *Deleted

## 2011-10-27 ENCOUNTER — Ambulatory Visit: Payer: Medicare Other | Admitting: Internal Medicine

## 2011-10-27 DIAGNOSIS — D518 Other vitamin B12 deficiency anemias: Secondary | ICD-10-CM

## 2011-11-03 DIAGNOSIS — D518 Other vitamin B12 deficiency anemias: Secondary | ICD-10-CM | POA: Diagnosis not present

## 2011-11-03 MED ORDER — CYANOCOBALAMIN 1000 MCG/ML IJ SOLN
1000.0000 ug | Freq: Once | INTRAMUSCULAR | Status: AC
  Administered 2011-11-03: 1000 ug via INTRAMUSCULAR

## 2011-11-11 ENCOUNTER — Encounter: Payer: Self-pay | Admitting: Internal Medicine

## 2011-11-11 ENCOUNTER — Ambulatory Visit (INDEPENDENT_AMBULATORY_CARE_PROVIDER_SITE_OTHER): Payer: Medicare Other | Admitting: Internal Medicine

## 2011-11-11 VITALS — BP 130/86 | HR 105 | Temp 97.8°F | Wt 164.0 lb

## 2011-11-11 DIAGNOSIS — J449 Chronic obstructive pulmonary disease, unspecified: Secondary | ICD-10-CM | POA: Diagnosis not present

## 2011-11-11 NOTE — Progress Notes (Signed)
Subjective:    Patient ID: [name], male    DOB: 10-26-1940    MRN: [id number]  HPI  71yowm quit smoking July 2011 at CABG and did fine in terms of improved ex tol > walk easily [place] grade [place] with w/u c/w GOLD II COPD PFT's 01/28/10 FEV1 1.16 46% ratio 44 with 27% improvement p B2    March 11, 2010-- med review. He has brought all his meds in today. He is taking symbicort only once daily (not two times a day ) and has not started on pepcid at bedtime. Last ov changed off ace /norvasc now on bystolic. Cough is better w/ no further hemoptysis but not totally gone. Xray last ov showed persistent patchy and nodular airspace disease in the left lung. I will call with chest xray results.  Follow med calenadar closley and bring to each visit.  add pepcid 20mg  at bedtime  Increase Symbicort 2 puffs two times a day    April 23, 2010 ov cough and sob no better and throat very sore, no better p rx with rx as uri with doxy. mild dysphagia, mod hoarseness rec .stop symbicort  Try xopenex in place of ventolin  Dulera 100 2 bid Work on inhaler technique  See calendar for specific medication instructions > lost it   07/29/2010 ov/[name] cc doe not better, no where near baseline  not able to tolerate dulera due to mouth irritation, did not try spacer as rec or arm and hammer toothpaste as rec. No purulent sputum. Sleeping ok without nocturnal  or early am exac of resp c/o's or need for noct saba. >>Symbicort decreased 80, zyrtec prn   08/12/10 Follow up and med review Presents for follow up and med review.  He has brought all his meds , we organized his meds into a med calendar with pt education  It appears he is taking correctly.  Hoarseness is some better on lower dose of symbicort. Has has had more cough/congestion with green mucus and more throat drainage.  Has a lot of allergy like symptoms with nasal drip. No fever or chest pain or edema. rec Doxycycline 100mg  Twice daily  For 7 days   Mucinex DM Twice daily  As needed  Cough/congestion  Add Zyrtec 10mg  daily   11/11/2011 f/u ov/[name] no med calendar, again confused with maint vs prns, now even using 02 prn but could not tell me what resp meds he starts his day with ? Xopenex/mucinex?   No unusual cough, purulent sputum or sinus/hb symptoms on present rx. Only sob with heavy exertion, then sits and uses 02 afterwards for a few minutes to recover.  Sleeping ok without nocturnal  or early am exacerbation  of respiratory  c/o's or need for noct saba. Also denies any obvious fluctuation of symptoms with weather or environmental changes or other aggravating or alleviating factors except as outlined above   ROS  The following are not active complaints unless bolded sore throat, dysphagia, dental problems, itching, sneezing,  nasal congestion or excess/ purulent secretions, ear ache,   fever, chills, sweats, unintended wt loss, pleuritic or exertional cp, hemoptysis,  orthopnea pnd or leg swelling, presyncope, palpitations, heartburn, abdominal pain, anorexia, nausea, vomiting, diarrhea  or change in bowel or urinary habits, change in stools or urine, dysuria,hematuria,  rash, arthralgias, visual complaints, headache, numbness weakness or ataxia or problems with walking or coordination,  change in mood/affect or memory.  Past Medical History: Allergic rhinitis  Osteoporosis  1. Coronary artery disease status post multiple percutaneous coronary  inventions > CABG 09/2009  2. Peripheral vascular status post previous stenting of the totally  occluded iliac and status post stenting of an external iliac in  January 2009 with recurrent symptoms of questionable claudication.  3. Hypertension.  4. Hyperlipidemia.  5. History of amaurosis fugax 1998.  6. Gastroesophageal reflux disease.  anemia, B12 deficiency  AFIB--post CABG  Atrial fibrillation  COPD  - PFT's 01/28/10 FEV1 1.16 46% ratio 44 with 27% improvement p B2, DLCO  76%  - HFA 50% p coaching February 25, 2010 > 75% April 23, 2010 > 75%   Hemoptysis onset 01/2010  - CT chest 02/19/10 : no PE, no ca/ nodular infiltrates > f/u 03/11/10 cxr no nodules  COMPLEX MED REGIMEN Meds reviewed with pt education and computerized  med calendar completed March 11, 2010  08/12/10             Objective:   Physical Exam GEN: A/Ox3; pleasant , NAD, well nourished   Wt Readings from Last 3 Encounters:  11/11/11 164 lb (74.39 kg)  10/21/11 165 lb (74.844 kg)  08/24/11 165 lb (74.844 kg)     HEENT:  [place]/AT,  EACs-clear, TMs-wnl, NOSE-clear drainage THROAT-clear, no lesions, no postnasal drip or exudate noted.   NECK:  Supple w/ fair ROM; no JVD; normal carotid impulses w/o bruits; no thyromegaly or nodules palpated; no lymphadenopathy.  RESP  Clear  P & A; w/o, wheezes/ rales/ or rhonchi.no accessory muscle use, no dullness to percussion  CARD:  RRR, no m/r/g  , no peripheral edema, pulses intact, no cyanosis or clubbing.  GI:   Soft & nt; nml bowel sounds; no organomegaly or masses detected.  Musco: Warm bil, no deformities or joint swelling noted.   Neuro: alert, no focal deficits noted.    Skin: Warm, no lesions or rashes   05/03/11 cxr Stable chronic left basilar scarring and a small hiatal hernia. No  acute cardiopulmonary process identified.     Assessment & Plan:

## 2011-11-11 NOTE — Patient Instructions (Signed)
dulera 100 Take 2 puffs first thing in am and then another 2 puffs about 12 hours later.   Spiriva after dulera just in am  Only use your albuterol (xopenex) as a rescue medication to be used if you can't catch your breath by resting or doing a relaxed purse lip breathing pattern. The less you use it, the better it will work when you need it.   Please schedule a follow up visit in 3 months but call sooner if needed with pft's on return

## 2011-11-11 NOTE — Assessment & Plan Note (Addendum)
-   PFT's 01/28/10 FEV1 1.16 46% ratio 44 with 27% improvement p B2, DLCO 76%  - HFA 50% p coaching February 25, 2010 > 75% April 23, 2010 > 75% 07/29/2010  - Walked 3 laps @ 185 ft each stopped due to  End of study, no desats - Misplaced med calendar 07/29/2010 and not using spiriva q am as instructed.  DDX of  difficult airways managment all start with A and  include Adherence, Ace Inhibitors, Acid Reflux, Active Sinus Disease, Alpha 1 Antitripsin deficiency, Anxiety masquerading as Airways dz,  ABPA,  allergy(esp in young), Aspiration (esp in elderly), Adverse effects of DPI,  Active smokers, plus two Bs  = Bronchiectasis and Beta blocker use..and one C= CHF   Adherence is always the initial "prime suspect" and is a multilayered concern that requires a "trust but verify" approach in every patient - starting with knowing how to use medications, especially inhalers, correctly, keeping up with refills and understanding the fundamental difference between maintenance and prns vs those medications only taken for a very short course and then stopped and not refilled. The proper method of use, as well as anticipated side effects, of a metered-dose inhaler are discussed and demonstrated to the patient. Improved effectiveness after extensive coaching during this visit to a level of approximately  90%   ? Acid reflux > apparently using ppi correctly.  He has resisted use of the med calendar we provided him and using his prns as maint rx - offered to help him again but he needs to meet [place] half way.    Each maintenance medication was reviewed in detail including most importantly the difference between maintenance and as needed and under what circumstances the prns are to be used.  Please see instructions for details which were reviewed in writing and the patient given a copy.    No need for prn 02 from my perspective

## 2011-11-12 ENCOUNTER — Other Ambulatory Visit: Payer: Self-pay | Admitting: Internal Medicine

## 2011-11-14 NOTE — Telephone Encounter (Signed)
Dr.swords pt

## 2011-11-29 ENCOUNTER — Ambulatory Visit (INDEPENDENT_AMBULATORY_CARE_PROVIDER_SITE_OTHER): Payer: Medicare Other | Admitting: Internal Medicine

## 2011-11-29 DIAGNOSIS — D518 Other vitamin B12 deficiency anemias: Secondary | ICD-10-CM | POA: Diagnosis not present

## 2011-11-29 MED ORDER — CYANOCOBALAMIN 1000 MCG/ML IJ SOLN
1000.0000 ug | Freq: Once | INTRAMUSCULAR | Status: AC
  Administered 2011-11-29: 1000 ug via INTRAMUSCULAR

## 2011-12-10 ENCOUNTER — Other Ambulatory Visit: Payer: Self-pay | Admitting: Internal Medicine

## 2011-12-13 ENCOUNTER — Ambulatory Visit (INDEPENDENT_AMBULATORY_CARE_PROVIDER_SITE_OTHER): Payer: Medicare Other | Admitting: Cardiovascular Disease

## 2011-12-13 ENCOUNTER — Encounter: Payer: Self-pay | Admitting: Cardiovascular Disease

## 2011-12-13 VITALS — BP 112/80 | HR 83 | Ht 64.0 in | Wt 158.0 lb

## 2011-12-13 DIAGNOSIS — I2510 Atherosclerotic heart disease of native coronary artery without angina pectoris: Secondary | ICD-10-CM | POA: Diagnosis not present

## 2011-12-13 DIAGNOSIS — I4891 Unspecified atrial fibrillation: Secondary | ICD-10-CM

## 2011-12-13 NOTE — Progress Notes (Signed)
HPI:  71 year old gentleman presenting for followup evaluation. The patient has CAD status post CABG in 2011. He has peripheral arterial disease and has undergone iliac stenting. His main problem over the last 1-2 years has been progressive shortness of breath. We have evaluated him with an echocardiogram showing no significant abnormalities. His LV function has been normal. His BNP levels were normal. He has a 60+ year history of smoking and we have suspected COPD. He follows with Dr. Sherene Sires. He uses several inhalers that he thinks helped to some extent. The patient has mild orthopnea without PND. He denies chest pain or pressure, palpitations, or leg swelling. He still has episodes of lightheadedness and he notes wide variations in his heart rate and he checks this on his home pulse oximeter. We did an event recorder several months ago that showed no significant abnormalities.  Outpatient Encounter Prescriptions as of 12/13/2011  Medication Sig Dispense Refill  . aspirin 81 MG tablet Take 81 mg by mouth daily.        . Calcium Carbonate-Vit D-Min (CALTRATE 600+D PLUS) 600-400 MG-UNIT per tablet Chew 1 tablet by mouth daily.        . cetirizine (ZYRTEC ALLERGY) 10 MG tablet Take 1 tablet (10 mg total) by mouth every morning.      . cyanocobalamin (,VITAMIN B-12,) 1000 MCG/ML injection Inject 1 mL (1,000 mcg total) into the muscle every 30 (thirty) days.      Marland Kitchen dextromethorphan-guaiFENesin (MUCINEX DM) 30-600 MG per 12 hr tablet Take 1 tablet by mouth every 12 (twelve) hours as needed. cough       . DULERA 100-5 MCG/ACT AERO INHALE 2 PUFFS INTO THE LUNGS TWICE A DAY  13 g  5  . furosemide (LASIX) 40 MG tablet TAKE 1 TABLET BY MOUTH EVERY DAY  30 tablet  9  . KLOR-CON M20 20 MEQ tablet TAKE 1 TABLET BY MOUTH EVERY DAY  30 tablet  6  . losartan (COZAAR) 100 MG tablet TAKE 1 TABLET BY MOUTH EVERY DAY  30 tablet  11  . pantoprazole (PROTONIX) 40 MG tablet TAKE 1 TABLET BY MOUTH TWICE A DAY  60 tablet  5    . rosuvastatin (CRESTOR) 20 MG tablet daily. Take 1/2 tab by mouth every morning      . tiotropium (SPIRIVA) 18 MCG inhalation capsule Place 1 capsule (18 mcg total) into inhaler and inhale daily.  30 capsule  3  . triamcinolone (KENALOG) 0.025 % ointment APPLY 1 A SMALL AMOUNT TO AFFECTED AREA TWICE A DAY  80 g  2  . XOPENEX HFA 45 MCG/ACT inhaler INHALE 2 PUFFS INTO THE LUNGS EVERY 4 HOURS AS NEEDED  1 Inhaler  3    Allergies  Allergen Reactions  . Ticlopidine Hcl     REACTION: swelling    Past Medical History  Diagnosis Date  . HYPERLIPIDEMIA 11/21/2006  . HYPERTENSION 11/21/2006  . MYOCARDIAL INFARCTION, HX OF 11/21/2006  . CORONARY ARTERY DISEASE 11/21/2006  . OSTEOPOROSIS 11/21/2006  . COLONIC POLYPS 05/21/2007  . COPD 05/21/2007  . HIATAL HERNIA 05/21/2007  . DIVERTICULAR DISEASE 05/21/2007  . NEPHROLITHIASIS 05/21/2007  . GI BLEEDING 09/09/2008  . ANEMIA, B12 DEFICIENCY 12/02/2008  . CAROTID ARTERY STENOSIS 04/16/2009  . PERIPHERAL VASCULAR DISEASE 09/02/2009  . Atrial fibrillation 11/06/2009  . Allergic rhinitis   . Osteoporosis   . Hemoptysis   . Internal hemorrhoids   . GERD (gastroesophageal reflux disease)   . Cataract     ROS: Negative  except as per HPI  BP 112/80  Pulse 83  Ht 5\' 4"  (1.626 m)  Wt 71.668 kg (158 lb)  BMI 27.12 kg/m2  PHYSICAL EXAM: Pt is alert and oriented, pleasant elderly gentleman in NAD HEENT: normal Neck: JVP - normal, carotids 2+= without bruits Lungs: Decreased breath sounds with prolonged expiration bilaterally CV: RRR without murmur or gallop, heart sounds are distant Abd: soft, NT, Positive BS, no hepatomegaly Ext: no C/C/E, distal pulses intact and equal Skin: warm/dry no rash  EKG:  Sinus rhythm with PACs, heart rate 83 beats per minute, age-indeterminate septal infarct.  ASSESSMENT AND PLAN: 1. CAD status post CABG. The patient is stable without anginal symptoms. He will continue on his current medical program which includes  aspirin for antiplatelet therapy and Crestor for lipid lowering.  2. Peripheral arterial disease. The patient has no claudication symptoms at present. He has quit smoking. Will continue observation and see him back in 6 months.  3. Hyperlipidemia. Lipids reviewed and they show cholesterol 134, triglycerides 109, HDL 49, and LDL 64.  For followup, I would like to see him back in 6 months.

## 2011-12-13 NOTE — Patient Instructions (Addendum)
Your physician wants you to follow-up in: 6 MONTHS with Dr Cooper.  You will receive a reminder letter in the mail two months in advance. If you don't receive a letter, please call our office to schedule the follow-up appointment.  Your physician recommends that you continue on your current medications as directed. Please refer to the Current Medication list given to you today.  

## 2011-12-27 ENCOUNTER — Ambulatory Visit (INDEPENDENT_AMBULATORY_CARE_PROVIDER_SITE_OTHER): Payer: Medicare Other | Admitting: Internal Medicine

## 2011-12-27 DIAGNOSIS — D518 Other vitamin B12 deficiency anemias: Secondary | ICD-10-CM | POA: Diagnosis not present

## 2011-12-27 DIAGNOSIS — Z23 Encounter for immunization: Secondary | ICD-10-CM | POA: Diagnosis not present

## 2011-12-27 MED ORDER — CYANOCOBALAMIN 1000 MCG/ML IJ SOLN
1000.0000 ug | Freq: Once | INTRAMUSCULAR | Status: AC
  Administered 2011-12-27: 1000 ug via INTRAMUSCULAR

## 2012-01-27 ENCOUNTER — Ambulatory Visit (INDEPENDENT_AMBULATORY_CARE_PROVIDER_SITE_OTHER): Payer: Medicare Other | Admitting: Internal Medicine

## 2012-01-27 DIAGNOSIS — E538 Deficiency of other specified B group vitamins: Secondary | ICD-10-CM

## 2012-01-27 MED ORDER — CYANOCOBALAMIN 1000 MCG/ML IJ SOLN
1000.0000 ug | Freq: Once | INTRAMUSCULAR | Status: AC
  Administered 2012-01-27: 1000 ug via INTRAMUSCULAR

## 2012-02-17 ENCOUNTER — Ambulatory Visit (INDEPENDENT_AMBULATORY_CARE_PROVIDER_SITE_OTHER): Payer: Medicare Other | Admitting: Internal Medicine

## 2012-02-17 ENCOUNTER — Ambulatory Visit: Payer: Medicare Other | Admitting: Internal Medicine

## 2012-02-17 DIAGNOSIS — J449 Chronic obstructive pulmonary disease, unspecified: Secondary | ICD-10-CM | POA: Diagnosis not present

## 2012-02-17 DIAGNOSIS — J4489 Other specified chronic obstructive pulmonary disease: Secondary | ICD-10-CM

## 2012-02-17 LAB — PULMONARY FUNCTION TEST

## 2012-02-17 NOTE — Progress Notes (Signed)
PFT done today. 

## 2012-02-20 ENCOUNTER — Encounter: Payer: Self-pay | Admitting: Internal Medicine

## 2012-02-21 ENCOUNTER — Encounter: Payer: Self-pay | Admitting: Internal Medicine

## 2012-02-21 ENCOUNTER — Ambulatory Visit (INDEPENDENT_AMBULATORY_CARE_PROVIDER_SITE_OTHER): Payer: Medicare Other | Admitting: Internal Medicine

## 2012-02-21 ENCOUNTER — Ambulatory Visit (INDEPENDENT_AMBULATORY_CARE_PROVIDER_SITE_OTHER)
Admission: RE | Admit: 2012-02-21 | Discharge: 2012-02-21 | Disposition: A | Discharge status: Home / Self Care | Payer: Medicare Other | Source: Ambulatory Visit | Attending: Internal Medicine | Admitting: Internal Medicine

## 2012-02-21 VITALS — BP 98/60 | HR 78 | Temp 97.8°F | Ht 66.0 in | Wt 163.0 lb

## 2012-02-21 DIAGNOSIS — J4489 Other specified chronic obstructive pulmonary disease: Secondary | ICD-10-CM

## 2012-02-21 DIAGNOSIS — J449 Chronic obstructive pulmonary disease, unspecified: Secondary | ICD-10-CM

## 2012-02-21 MED ORDER — MOMETASONE FURO-FORMOTEROL FUM 200-5 MCG/ACT IN AERO: INHALATION_SPRAY | RESPIRATORY_TRACT | Status: DC

## 2012-02-21 MED ORDER — MOMETASONE FURO-FORMOTEROL FUM 200-5 MCG/ACT IN AERO: 2.0000 | INHALATION_SPRAY | Freq: Two times a day (BID) | RESPIRATORY_TRACT | Status: DC

## 2012-02-21 NOTE — Progress Notes (Signed)
Quick Note:  Spoke with pt and notified of results per Dr. Wert. Pt verbalized understanding and denied any questions.  ______ 

## 2012-02-21 NOTE — Patient Instructions (Addendum)
Try 200 dulera  Take 2 puffs first thing in am and then another 2 puffs about 12 hours later.   Only use your albuterol (xopenex) as a rescue medication to be used if you can't catch your breath by resting or doing a relaxed purse lip breathing pattern. The less you use it, the better it will work when you need it.   Please schedule a follow up visit in 3 months but call sooner if needed

## 2012-02-21 NOTE — Assessment & Plan Note (Addendum)
-   PFT's 01/28/10 FEV1 1.16 46% ratio 44 with 27% improvement p B2, DLCO 76%  - HFA 90% 02/21/2012  - Walked 3 laps @ 185 ft each stopped due to  End of study, no desats - Misplaced med calendar 07/29/2010 and not using spiriva q am as instructed. - PFT's 02/17/12  FEV1  1.20 (49%) ratio 45 and no better p B2 with dlco 64 corrects to 73  The proper method of use, as well as anticipated side effects, of a metered-dose inhaler are discussed and demonstrated to the patient. Improved effectiveness after extensive coaching during this visit to a level of approximately    He has GOLD III copd but acceptable symptoms and no significant tendency to exacerbations. He is only on th 100 of dulera and would like to see if pushing this to 200 helps his sob s worsening his upper airway symptoms > if not then just as well to leave him on the dulera 100 or symbicort 160 2bid dosing whichever his insurance prefers   Each maintenance medication was reviewed in detail including most importantly the difference between maintenance and as needed and under what circumstances the prns are to be used.  Please see instructions for details which were reviewed in writing and the patient given a copy.

## 2012-02-21 NOTE — Progress Notes (Signed)
Subjective:    Patient ID: [name], male    DOB: [birth year]    MRN: [id number]  HPI  71yowm quit smoking July 2011 at CABG and did fine in terms of improved ex tol > [place] with w/u c/w GOLD II COPD PFT's 01/28/10 FEV1 1.16 46% ratio 44 with 27% improvement p B2    March 11, 2010-- med review. He has brought all his meds in today. He is taking symbicort only once daily (not two times a day ) and has not started on pepcid at bedtime. Last ov changed off ace /norvasc now on bystolic. Cough is better w/ no further hemoptysis but not totally gone. Xray last ov showed persistent patchy and nodular airspace disease in the left lung. I will call with chest xray results.  Follow med calenadar closley and bring to each visit.  add pepcid 20mg  at bedtime  Increase Symbicort 2 puffs two times a day    April 23, 2010 ov cough and sob no better and throat very sore, no better p rx with rx as uri with doxy. mild dysphagia, mod hoarseness rec .stop symbicort  Try xopenex in place of ventolin  Dulera 100 2 bid Work on inhaler technique  See calendar for specific medication instructions > lost it   07/29/2010 ov/[name] cc doe not better, no where near baseline  not able to tolerate dulera due to mouth irritation, did not try spacer as rec or arm and hammer toothpaste as rec. No purulent sputum. Sleeping ok without nocturnal  or early am exac of resp c/o's or need for noct saba. >>Symbicort decreased 80, zyrtec prn   08/12/10 Follow up and med review Presents for follow up and med review.  He has brought all his meds , we organized his meds into a med calendar with pt education  It appears he is taking correctly.  Hoarseness is some better on lower dose of symbicort. Has has had more cough/congestion with green mucus and more throat drainage.  Has a lot of allergy like symptoms with nasal drip. No fever or chest pain or edema. rec Doxycycline 100mg  Twice daily  For 7 days   Mucinex DM Twice daily  As needed  Cough/congestion  Add Zyrtec 10mg  daily   11/11/2011 f/u ov/[name] no med calendar, again confused with maint vs prns, now even using 02 prn but could not tell me what resp meds he starts his day with ? Xopenex/mucinex?   rec dulera 100 Take 2 puffs first thing in am and then another 2 puffs about 12 hours later.  Spiriva after dulera just in am Only use your albuterol (xopenex) as needed    02/21/2012 f/u ov/[name] cc overall pleased with level of activity tol but still using xopenex when gets in a hurry, maybe using it twice daily, not really pacing himself.  No obvious daytime variabilty or assoc chronic cough or cp or chest tightness, subjective wheeze overt sinus or hb symptoms. No unusual exp hx    Sleeping ok without nocturnal  or early am exacerbation  of respiratory  c/o's or need for noct saba. Also denies any obvious fluctuation of symptoms with weather or environmental changes or other aggravating or alleviating factors except as outlined above   ROS  The following are not active complaints unless bolded sore throat, dysphagia, dental problems, itching, sneezing,  nasal congestion or excess/ purulent secretions, ear ache,   fever, chills, sweats, unintended wt loss, pleuritic  or exertional cp, hemoptysis,  orthopnea pnd or leg swelling, presyncope, palpitations, heartburn, abdominal pain, anorexia, nausea, vomiting, diarrhea  or change in bowel or urinary habits, change in stools or urine, dysuria,hematuria,  rash, arthralgias, visual complaints, headache, numbness weakness or ataxia or problems with walking or coordination,  change in mood/affect or memory.            Past Medical History: Allergic rhinitis  Osteoporosis  1. Coronary artery disease status post multiple percutaneous coronary  inventions > CABG 09/2009  2. Peripheral vascular status post previous stenting of the totally  occluded iliac and status post stenting of an external  iliac in  January 2009 with recurrent symptoms of questionable claudication.  3. Hypertension.  4. Hyperlipidemia.  5. History of amaurosis fugax 1998.  6. Gastroesophageal reflux disease.  anemia, B12 deficiency  AFIB--post CABG  Atrial fibrillation  COPD  - PFT's 01/28/10 FEV1 1.16 46% ratio 44 with 27% improvement p B2, DLCO 76%  - HFA 50% p coaching February 25, 2010 > 75% April 23, 2010 > 75%   Hemoptysis onset 01/2010  - CT chest 02/19/10 : no PE, no ca/ nodular infiltrates > f/u 03/11/10 cxr no nodules  COMPLEX MED REGIMEN Meds reviewed with pt education and computerized  med calendar completed March 11, 2010  08/12/10      Objective:   Physical Exam  GEN: A/Ox3; pleasant , NAD, well nourished   Wt 02/21/2012  163 Wt Readings from Last 3 Encounters:  11/11/11 164 lb (74.39 kg)  10/21/11 165 lb (74.844 kg)  08/24/11 165 lb (74.844 kg)     HEENT:  [place]/AT,  EACs-clear, TMs-wnl, NOSE-clear drainage THROAT-clear, no lesions, no postnasal drip or exudate noted.   NECK:  Supple w/ fair ROM; no JVD; normal carotid impulses w/o bruits; no thyromegaly or nodules palpated; no lymphadenopathy.  RESP  Clear  P & A; w/o, wheezes/ rales/ or rhonchi.no accessory muscle use, no dullness to percussion  CARD:  RRR, no m/r/g  , no peripheral edema, pulses intact, no cyanosis or clubbing.  GI:   Soft & nt; nml bowel sounds; no organomegaly or masses detected.  Musco: Warm bil, no deformities or joint swelling noted.     CXR  02/21/2012 :  No acute cardiopulmonary disease.  Background changes of underlying COPD again noted      Assessment & Plan:

## 2012-02-27 ENCOUNTER — Ambulatory Visit (INDEPENDENT_AMBULATORY_CARE_PROVIDER_SITE_OTHER): Payer: Medicare Other | Admitting: Internal Medicine

## 2012-02-27 DIAGNOSIS — E538 Deficiency of other specified B group vitamins: Secondary | ICD-10-CM | POA: Diagnosis not present

## 2012-02-27 MED ORDER — CYANOCOBALAMIN 1000 MCG/ML IJ SOLN
1000.0000 ug | Freq: Once | INTRAMUSCULAR | Status: AC
  Administered 2012-02-27: 1000 ug via INTRAMUSCULAR

## 2012-03-02 ENCOUNTER — Encounter: Payer: Self-pay | Admitting: Internal Medicine

## 2012-03-05 ENCOUNTER — Ambulatory Visit: Payer: Medicare Other | Admitting: Internal Medicine

## 2012-04-02 ENCOUNTER — Ambulatory Visit (INDEPENDENT_AMBULATORY_CARE_PROVIDER_SITE_OTHER): Payer: Medicare Other | Admitting: Internal Medicine

## 2012-04-02 DIAGNOSIS — E538 Deficiency of other specified B group vitamins: Secondary | ICD-10-CM | POA: Diagnosis not present

## 2012-04-02 MED ORDER — CYANOCOBALAMIN 2000 MCG PO TABS: 2000.0000 ug | ORAL_TABLET | Freq: Every day | ORAL | Status: DC

## 2012-04-02 MED ORDER — CYANOCOBALAMIN 1000 MCG/ML IJ SOLN
1000.0000 ug | Freq: Once | INTRAMUSCULAR | Status: AC
  Administered 2012-04-02: 1000 ug via INTRAMUSCULAR

## 2012-04-05 ENCOUNTER — Telehealth: Payer: Self-pay | Admitting: Cardiovascular Disease

## 2012-04-05 DIAGNOSIS — R6889 Other general symptoms and signs: Secondary | ICD-10-CM | POA: Diagnosis not present

## 2012-04-05 DIAGNOSIS — M25519 Pain in unspecified shoulder: Secondary | ICD-10-CM | POA: Diagnosis not present

## 2012-04-05 NOTE — Telephone Encounter (Signed)
New Problem:    Patient called in because his left arm and should were hurting to day and he went to the fire department to have his BP checked.  After checking they called the paramedics and advised him to contact our office.  Please call back.

## 2012-04-05 NOTE — Telephone Encounter (Signed)
Pt states he had a sharp pain in his left shoulder blade that went down his left arm and made his hand tingle, last only a few seconds, he went to the fire dept bp 160/88 then 5 minutes later it was 130/78. Denies SOB / nausea. Discussed with Dr Excell Seltzer and he advised having the pt call back to make an appointment if it happens again. Pt informed and agreed to plan, set his 6 month app 05/2012.

## 2012-04-21 ENCOUNTER — Other Ambulatory Visit: Payer: Self-pay | Admitting: Internal Medicine

## 2012-04-28 ENCOUNTER — Other Ambulatory Visit: Payer: Self-pay | Admitting: Cardiovascular Disease

## 2012-05-31 ENCOUNTER — Ambulatory Visit (INDEPENDENT_AMBULATORY_CARE_PROVIDER_SITE_OTHER): Payer: Medicare Other | Admitting: Internal Medicine

## 2012-05-31 ENCOUNTER — Encounter: Payer: Self-pay | Admitting: Internal Medicine

## 2012-05-31 VITALS — BP 126/80 | HR 98 | Temp 97.8°F | Ht 64.75 in | Wt 154.2 lb

## 2012-05-31 DIAGNOSIS — J449 Chronic obstructive pulmonary disease, unspecified: Secondary | ICD-10-CM | POA: Diagnosis not present

## 2012-05-31 DIAGNOSIS — R05 Cough: Secondary | ICD-10-CM | POA: Insufficient documentation

## 2012-05-31 NOTE — Assessment & Plan Note (Signed)
-   PFT's 01/28/10 FEV1 1.16 46% ratio 44 with 27% improvement p B2, DLCO 76%  - HFA 90% 05/31/2012  - Walked 3 laps @ 185 ft each stopped due to  End of study, no desats - Misplaced med calendar 07/29/2010 and not using spiriva q am as instructed. - PFT's 02/17/12  FEV1  1.20 (49%) ratio 45 and no better p B2 with dlco 64 corrects to 73  GOLD III but well controlled on dulera 200 and spiriva despite present flare of cough which is discussed separately  The proper method of use, as well as anticipated side effects, of a metered-dose inhaler are discussed and demonstrated to the patient. Improved effectiveness after extensive coaching during this visit to a level of approximately  90%   Each maintenance medication was reviewed in detail including most importantly the difference between maintenance and as needed and under what circumstances the prns are to be used.  Please see instructions for details which were reviewed in writing and the patient given a copy.

## 2012-05-31 NOTE — Assessment & Plan Note (Signed)
Explained natural history of uri and why it's necessary in patients at risk to treat GERD aggressively  at least  short term   to reduce risk of evolving cyclical cough initially  triggered by epithelial injury and a heightened sensitivty to the effects of any upper airway irritants,  most importantly acid - related.  That is, the more sensitive the epithelium damaged for virus, the more the cough, the more the secondary reflux (especially in those prone to reflux) the more the irritation of the sensitive mucosa and so on in a cyclical pattern.  See instructions for specific recommendations which were reviewed directly with the patient who was given a copy with highlighter outlining the key components.  

## 2012-05-31 NOTE — Progress Notes (Signed)
Subjective:    Patient ID: [name], male    DOB: 1940-04-05    MRN: [id number]  HPI  7 yowm quit smoking July 2011 at CABG and did fine in terms of improved ex tol > [place] with w/u c/w GOLD II COPD PFT's 01/28/10 FEV1 1.16 46% ratio 44 with 27% improvement p B2    March 11, 2010-- med review. He has brought all his meds in today. He is taking symbicort only once daily (not two times a day ) and has not started on pepcid at bedtime. Last ov changed off ace /norvasc now on bystolic. Cough is better w/ no further hemoptysis but not totally gone. Xray last ov showed persistent patchy and nodular airspace disease in the left lung. I will call with chest xray results.  Follow med calenadar closley and bring to each visit.  add pepcid 20mg  at bedtime  Increase Symbicort 2 puffs two times a day    April 23, 2010 ov cough and sob no better and throat very sore, no better p rx with rx as uri with doxy. mild dysphagia, mod hoarseness rec .stop symbicort  Try xopenex in place of ventolin  Dulera 100 2 bid Work on inhaler technique  See calendar for specific medication instructions > lost it   07/29/2010 ov/Wert cc doe not better, no where near baseline  not able to tolerate dulera due to mouth irritation, did not try spacer as rec or arm and hammer toothpaste as rec. No purulent sputum. Sleeping ok without nocturnal  or early am exac of resp c/o's or need for noct saba. >>Symbicort decreased 80, zyrtec prn   08/12/10 Follow up and med review Presents for follow up and med review.  He has brought all his meds , we organized his meds into a med calendar with pt education  It appears he is taking correctly.  Hoarseness is some better on lower dose of symbicort. Has has had more cough/congestion with green mucus and more throat drainage.  Has a lot of allergy like symptoms with nasal drip. No fever or chest pain or edema. rec Doxycycline 100mg  Twice daily  For 7  days  Mucinex DM Twice daily  As needed  Cough/congestion  Add Zyrtec 10mg  daily   11/11/2011 f/u ov/Wert no med calendar, again confused with maint vs prns, now even using 02 prn but could not tell me what resp meds he starts his day with ? Xopenex/mucinex?   rec dulera 100 Take 2 puffs first thing in am and then another 2 puffs about 12 hours later.  Spiriva after dulera just in am Only use your albuterol (xopenex) as needed    02/21/2012 f/u ov/Wert cc overall pleased with level of activity tol but still using xopenex when gets in a hurry, maybe using it twice daily, not really pacing himself.  rec Try 200 dulera  Take 2 puffs first thing in am and then another 2 puffs about 12 hours later.  Only use your albuterol (xopenex) as a rescue medication   05/31/2012 f/u ov/Wert cc improved but still can't do steps s sob and getting over a cold with increase cough in am mucoid, no longer on bid ppi  Has not max'd mdm or tried the xopenex for the cough or felt he needed it for breathing and overall happy with his functional status.   No obvious daytime variabilty cp or chest tightness, subjective wheeze overt sinus or hb symptoms.  No unusual exp hx    Sleeping ok without nocturnal   exacerbation  of respiratory  c/o's or need for noct saba. Also denies any obvious fluctuation of symptoms with weather or environmental changes or other aggravating or alleviating factors except as outlined above   ROS  The following are not active complaints unless bolded sore throat, dysphagia, dental problems, itching, sneezing,  nasal congestion or excess/ purulent secretions, ear ache,   fever, chills, sweats, unintended wt loss, pleuritic or exertional cp, hemoptysis,  orthopnea pnd or leg swelling, presyncope, palpitations, heartburn, abdominal pain, anorexia, nausea, vomiting, diarrhea  or change in bowel or urinary habits, change in stools or urine, dysuria,hematuria,  rash, arthralgias, visual complaints,  headache, numbness weakness or ataxia or problems with walking or coordination,  change in mood/affect or memory.            Past Medical History: Allergic rhinitis  Osteoporosis  1. Coronary artery disease status post multiple percutaneous coronary  inventions > CABG 09/2009  2. Peripheral vascular status post previous stenting of the totally  occluded iliac and status post stenting of an external iliac in  January 2009 with recurrent symptoms of questionable claudication.  3. Hypertension.  4. Hyperlipidemia.  5. History of amaurosis fugax 1998.  6. Gastroesophageal reflux disease.  anemia, B12 deficiency  AFIB--post CABG  Atrial fibrillation  COPD  - PFT's 01/28/10 FEV1 1.16 46% ratio 44 with 27% improvement p B2, DLCO 76%  - HFA 50% p coaching February 25, 2010 > 75% April 23, 2010 > 75%   Hemoptysis onset 01/2010  - CT chest 02/19/10 : no PE, no ca/ nodular infiltrates > f/u 03/11/10 cxr no nodules  COMPLEX MED REGIMEN Meds reviewed with pt education and computerized  med calendar completed March 11, 2010  08/12/10      Objective:   Physical Exam  GEN: A/Ox3; pleasant , NAD, well nourished   Wt 02/21/2012  163 > 164 05/31/2012  Wt Readings from Last 3 Encounters:  11/11/11 164 lb (74.39 kg)  10/21/11 165 lb (74.844 kg)  08/24/11 165 lb (74.844 kg)     HEENT mild turbinate edema.  Oropharynx no thrush or excess pnd or cobblestoning.  No JVD or cervical adenopathy. Mild accessory muscle hypertrophy. Trachea midline, nl thryroid. Chest was hyperinflated by percussion with diminished breath sounds and moderate increased exp time without wheeze. Hoover sign positive at mid inspiration. Regular rate and rhythm without murmur gallop or rub or increase P2 or edema.  Abd: no hsm, nl excursion. Ext warm without cyanosis or clubbing.      CXR  02/21/2012 :  No acute cardiopulmonary disease.  Background changes of underlying COPD again noted      Assessment & Plan:

## 2012-05-31 NOTE — Patient Instructions (Addendum)
When coughing>  Add pepcid ac 20 mg at bedtime and push mucinex dm up to 1200 mg every 12 hours  Please schedule a follow up visit in 3 months but call sooner if needed

## 2012-06-04 ENCOUNTER — Ambulatory Visit (INDEPENDENT_AMBULATORY_CARE_PROVIDER_SITE_OTHER): Payer: Medicare Other | Admitting: Internal Medicine

## 2012-06-04 ENCOUNTER — Encounter: Payer: Self-pay | Admitting: Internal Medicine

## 2012-06-04 VITALS — BP 114/80 | HR 88 | Temp 97.9°F | Wt 163.0 lb

## 2012-06-04 DIAGNOSIS — D518 Other vitamin B12 deficiency anemias: Secondary | ICD-10-CM

## 2012-06-04 DIAGNOSIS — R739 Hyperglycemia, unspecified: Secondary | ICD-10-CM

## 2012-06-04 DIAGNOSIS — E785 Hyperlipidemia, unspecified: Secondary | ICD-10-CM | POA: Diagnosis not present

## 2012-06-04 DIAGNOSIS — I10 Essential (primary) hypertension: Secondary | ICD-10-CM | POA: Diagnosis not present

## 2012-06-04 DIAGNOSIS — I2510 Atherosclerotic heart disease of native coronary artery without angina pectoris: Secondary | ICD-10-CM | POA: Diagnosis not present

## 2012-06-04 DIAGNOSIS — R7309 Other abnormal glucose: Secondary | ICD-10-CM

## 2012-06-04 DIAGNOSIS — F17200 Nicotine dependence, unspecified, uncomplicated: Secondary | ICD-10-CM

## 2012-06-04 LAB — BASIC METABOLIC PANEL
BUN: 17 mg/dL (ref 6–23)
CO2: 30 mEq/L (ref 19–32)
Calcium: 9 mg/dL (ref 8.4–10.5)
Creatinine, Ser: 1 mg/dL (ref 0.4–1.5)
GFR: 81.8 mL/min (ref >60.00)
Glucose, Bld: 100 mg/dL — ABNORMAL HIGH (ref 70–99)

## 2012-06-04 LAB — CBC WITH DIFFERENTIAL/PLATELET
Basophils Absolute: 0 10*3/uL (ref 0.0–0.1)
HCT: 45.2 % (ref 39.0–52.0)
Hemoglobin: 15.2 g/dL (ref 13.0–17.0)
Lymphs Abs: 1.7 10*3/uL (ref 0.7–4.0)
MCV: 98.8 fl (ref 78.0–100.0)
Monocytes Absolute: 0.9 10*3/uL (ref 0.1–1.0)
Monocytes Relative: 13.2 % — ABNORMAL HIGH (ref 3.0–12.0)
Neutro Abs: 3.9 10*3/uL (ref 1.4–7.7)
RDW: 14.3 % (ref 11.5–14.6)

## 2012-06-04 LAB — HEPATIC FUNCTION PANEL
Albumin: 3.7 g/dL (ref 3.5–5.2)
Alkaline Phosphatase: 43 U/L (ref 39–117)
Total Protein: 6.9 g/dL (ref 6.0–8.3)

## 2012-06-04 LAB — LIPID PANEL
Cholesterol: 135 mg/dL (ref 0–200)
Total CHOL/HDL Ratio: 3
Triglycerides: 109 mg/dL (ref 0.0–149.0)

## 2012-06-04 LAB — TSH: TSH: 2.41 u[IU]/mL (ref 0.35–5.50)

## 2012-06-04 NOTE — Assessment & Plan Note (Signed)
Lipid Panel     Component Value Date/Time   CHOL 135 06/04/2012 0920   TRIG 109.0 06/04/2012 0920   HDL 38.80* 06/04/2012 0920   CHOLHDL 3 06/04/2012 0920   VLDL 21.8 06/04/2012 0920   LDLCALC 74 06/04/2012 0920   Continue current meds

## 2012-06-04 NOTE — Assessment & Plan Note (Signed)
He has quit approx 1.5 years ago

## 2012-06-04 NOTE — Assessment & Plan Note (Signed)
Ok for oral replacement 

## 2012-06-04 NOTE — Progress Notes (Signed)
Lipids- tolerating meds  htn-- no home bps , tolerating meds  CAD-- no sxs of CP, SOB, pnd  COPD- continues to smoke  Past Medical History  Diagnosis Date  . HYPERLIPIDEMIA 11/21/2006  . HYPERTENSION 11/21/2006  . MYOCARDIAL INFARCTION, HX OF 11/21/2006  . CORONARY ARTERY DISEASE 11/21/2006  . OSTEOPOROSIS 11/21/2006  . COLONIC POLYPS 05/21/2007  . COPD 05/21/2007  . HIATAL HERNIA 05/21/2007  . DIVERTICULAR DISEASE 05/21/2007  . NEPHROLITHIASIS 05/21/2007  . GI BLEEDING 09/09/2008  . ANEMIA, B12 DEFICIENCY 12/02/2008  . CAROTID ARTERY STENOSIS 04/16/2009  . PERIPHERAL VASCULAR DISEASE 09/02/2009  . Atrial fibrillation 11/06/2009  . Allergic rhinitis   . Osteoporosis   . Hemoptysis   . Internal hemorrhoids   . GERD (gastroesophageal reflux disease)   . Cataract     History   Social History  . Marital Status: Widowed    Spouse Name: N/A    Number of Children: N/A  . Years of Education: N/A   Occupational History  . retired from [employer] and [employer]    Social History Main Topics  . Smoking status: Former Smoker -- 3.00 packs/day for 50 years    Types: Cigarettes    Quit date: 09/25/2009  . Smokeless tobacco: Never Used  . Alcohol Use: 3.0 oz/week    6 drink(s) per week  . Drug Use: No  . Sexually Active: Not on file   Other Topics Concern  . Not on file   Social History Narrative   Regular exercise - no    Past Surgical History  Procedure Laterality Date  . Thyroidectomy    . Coronary stent placement      03/07/2001  . Cholecystectomy    . Appendectomy    . Hemorrhoid surgery    . Inguinal hernia repair  10/10/08  . Coronary artery bypass graft  2011  . Ptca      Family History  Problem Relation Age of Onset  . Arthritis Mother     rheumatoid  . Heart attack Father   . Heart disease Father   . Heart disease Brother   . Colon cancer Brother 27  . Stomach cancer Neg Hx   . Heart disease Brother     Allergies  Allergen Reactions  . Ticlopidine Hcl    REACTION: swelling    Current Outpatient Prescriptions on File Prior to Visit  Medication Sig Dispense Refill  . aspirin 81 MG tablet Take 81 mg by mouth daily.        . Calcium Carbonate-Vit D-Min (CALTRATE 600+D PLUS) 600-400 MG-UNIT per tablet Chew 1 tablet by mouth daily.        . cetirizine (ZYRTEC ALLERGY) 10 MG tablet Take 1 tablet (10 mg total) by mouth every morning.      . cyanocobalamin ([employer] VITAMIN B12) 2000 MCG tablet Take 1 tablet (2,000 mcg total) by mouth daily.      Marland Kitchen dextromethorphan-guaiFENesin (MUCINEX DM) 30-600 MG per 12 hr tablet Take 1 tablet by mouth every 12 (twelve) hours as needed. cough       . furosemide (LASIX) 40 MG tablet TAKE 1 TABLET BY MOUTH EVERY DAY  30 tablet  9  . KLOR-CON M20 20 MEQ tablet TAKE 1 TABLET BY MOUTH EVERY DAY  30 tablet  6  . losartan (COZAAR) 100 MG tablet TAKE 1 TABLET BY MOUTH EVERY DAY  30 tablet  11  . mometasone-formoterol (DULERA) 200-5 MCG/ACT AERO Take 2 puffs first thing in am and then another  2 puffs about 12 hours later.  1 Inhaler  11  . pantoprazole (PROTONIX) 40 MG tablet TAKE 1 TABLET BY MOUTH TWICE A DAY  60 tablet  5  . rosuvastatin (CRESTOR) 20 MG tablet daily. Take 1/2 tab by mouth every morning      . tiotropium (SPIRIVA) 18 MCG inhalation capsule Place 1 capsule (18 mcg total) into inhaler and inhale daily.  30 capsule  3  . triamcinolone (KENALOG) 0.025 % ointment APPLY 1 A SMALL AMOUNT TO AFFECTED AREA TWICE A DAY  80 g  2  . XOPENEX HFA 45 MCG/ACT inhaler INHALE 2 PUFFS INTO THE LUNGS EVERY 4 HOURS AS NEEDED  1 Inhaler  3   No current facility-administered medications on file prior to visit.     patient denies chest pain, shortness of breath, orthopnea. Denies lower extremity edema, abdominal pain, change in appetite, change in bowel movements. Patient denies rashes, musculoskeletal complaints. No other specific complaints in a complete review of systems.   BP 114/80  Pulse 88  Temp(Src) 97.9 F (36.6 C) (Oral)   Wt 163 lb (73.936 kg)  BMI 27.32 kg/m2  SpO2 94%  well-developed well-nourished male in no acute distress. HEENT exam atraumatic, normocephalic, neck supple without jugular venous distention. Chest clear to auscultation cardiac exam S1-S2 are regular. Abdominal exam overweight with bowel sounds, soft and nontender. Extremities no edema. Neurologic exam is alert with a normal gait.

## 2012-06-04 NOTE — Assessment & Plan Note (Signed)
No sxs Continue risk factor modification 

## 2012-06-05 ENCOUNTER — Telehealth: Payer: Self-pay | Admitting: Internal Medicine

## 2012-06-05 ENCOUNTER — Other Ambulatory Visit: Payer: Self-pay | Admitting: Internal Medicine

## 2012-06-05 DIAGNOSIS — E119 Type 2 diabetes mellitus without complications: Secondary | ICD-10-CM

## 2012-06-05 NOTE — Telephone Encounter (Signed)
Pt returning your call

## 2012-06-05 NOTE — Telephone Encounter (Signed)
See result note.  

## 2012-06-07 ENCOUNTER — Encounter: Payer: Self-pay | Admitting: Cardiovascular Disease

## 2012-06-07 ENCOUNTER — Ambulatory Visit (INDEPENDENT_AMBULATORY_CARE_PROVIDER_SITE_OTHER): Payer: Medicare Other | Admitting: Cardiovascular Disease

## 2012-06-07 VITALS — BP 118/64 | HR 89 | Ht 65.5 in | Wt 162.0 lb

## 2012-06-07 DIAGNOSIS — I70219 Atherosclerosis of native arteries of extremities with intermittent claudication, unspecified extremity: Secondary | ICD-10-CM | POA: Diagnosis not present

## 2012-06-07 DIAGNOSIS — I2510 Atherosclerotic heart disease of native coronary artery without angina pectoris: Secondary | ICD-10-CM | POA: Diagnosis not present

## 2012-06-07 NOTE — Progress Notes (Signed)
HPI:  72 year old [name] presented for followup evaluation. The patient has CAD status post CABG in 2011. He also has peripheral arterial disease with history of iliac stenting. He continues to have difficulty with shortness of breath. Recent lab work was done in his liver function tests are normal. Renal function is normal creatinine 1.0. Cholesterol is 135, HDL 39, and LDL 74.  He continues to have shortness of breath with exertion. This is slightly better than when I saw him last, but he does not appreciate any major change. He has no chest pain or pressure. He denies lightheadedness or syncope. He does complain of bilateral thigh and calf pain with walking. He's had no resting symptoms. He is equally limited by shortness of breath.  Outpatient Encounter Prescriptions as of 06/07/2012  Medication Sig Dispense Refill  . aspirin 81 MG tablet Take 81 mg by mouth daily.        . Calcium Carbonate-Vit D-Min (CALTRATE 600+D PLUS) 600-400 MG-UNIT per tablet Chew 1 tablet by mouth daily.        . cetirizine (ZYRTEC ALLERGY) 10 MG tablet Take 1 tablet (10 mg total) by mouth every morning.      . cyanocobalamin ([employer]S VITAMIN B12) 2000 MCG tablet Take 1 tablet (2,000 mcg total) by mouth daily.      Marland Kitchen dextromethorphan-guaiFENesin (MUCINEX DM) 30-600 MG per 12 hr tablet Take 1 tablet by mouth every 12 (twelve) hours as needed. cough       . furosemide (LASIX) 40 MG tablet TAKE 1 TABLET BY MOUTH EVERY DAY  30 tablet  9  . KLOR-CON M20 20 MEQ tablet TAKE 1 TABLET BY MOUTH EVERY DAY  30 tablet  6  . losartan (COZAAR) 100 MG tablet TAKE 1 TABLET BY MOUTH EVERY DAY  30 tablet  11  . mometasone-formoterol (DULERA) 200-5 MCG/ACT AERO Take 2 puffs first thing in am and then another 2 puffs about 12 hours later.  1 Inhaler  11  . pantoprazole (PROTONIX) 40 MG tablet TAKE 1 TABLET BY MOUTH TWICE A DAY  60 tablet  5  . rosuvastatin (CRESTOR) 20 MG tablet daily. Take 1/2 tab by mouth every morning      . tiotropium  (SPIRIVA) 18 MCG inhalation capsule Place 1 capsule (18 mcg total) into inhaler and inhale daily.  30 capsule  3  . triamcinolone (KENALOG) 0.025 % ointment APPLY 1 A SMALL AMOUNT TO AFFECTED AREA TWICE A DAY  80 g  2  . XOPENEX HFA 45 MCG/ACT inhaler INHALE 2 PUFFS INTO THE LUNGS EVERY 4 HOURS AS NEEDED  1 Inhaler  3   No facility-administered encounter medications on file as of 06/07/2012.    Allergies  Allergen Reactions  . Ticlopidine Hcl     REACTION: swelling    Past Medical History  Diagnosis Date  . HYPERLIPIDEMIA 11/21/2006  . HYPERTENSION 11/21/2006  . MYOCARDIAL INFARCTION, HX OF 11/21/2006  . CORONARY ARTERY DISEASE 11/21/2006  . OSTEOPOROSIS 11/21/2006  . COLONIC POLYPS 05/21/2007  . COPD 05/21/2007  . HIATAL HERNIA 05/21/2007  . DIVERTICULAR DISEASE 05/21/2007  . NEPHROLITHIASIS 05/21/2007  . GI BLEEDING 09/09/2008  . ANEMIA, B12 DEFICIENCY 12/02/2008  . CAROTID ARTERY STENOSIS 04/16/2009  . PERIPHERAL VASCULAR DISEASE 09/02/2009  . Atrial fibrillation 11/06/2009  . Allergic rhinitis   . Osteoporosis   . Hemoptysis   . Internal hemorrhoids   . GERD (gastroesophageal reflux disease)   . Cataract     ROS: Negative except as per HPI  BP 118/64  Pulse 89  Ht 5' 5.5" (1.664 m)  Wt 73.483 kg (162 lb)  BMI 26.54 kg/m2  PHYSICAL EXAM: Pt is alert and oriented, early male in NAD HEENT: normal Neck: JVP - normal, carotids 2+= with a right carotid bruit Lungs: CTA bilaterally but prolonged expiratory phase CV: RRR without murmur or gallop, distant heart sounds Abd: soft, NT, Positive BS, no hepatomegaly Ext: no C/C/E, femoral pulses 2+ and equal bilaterally, DP and PT pulses are palpable but diminished bilaterally Skin: warm/dry no rash  EKG:  Normal sinus rhythm 89 beats per minute, PVCs are present, mild RV conduction delay noted.  ASSESSMENT AND PLAN: 1. Coronary atherosclerosis, native vessel. The patient remained stable following coronary bypass surgery. He is having  no symptoms of myocardial ischemia. We'll continue his current medical program.  2. Lower extremity peripheral arterial disease with intermittent claudication. His leg pain with ambulation is consistent with claudication. This has not particularly lifestyle limiting because he is equally limited by shortness of breath. He has good femoral pulses without evidence of inflow disease, and I'll be inclined to continue with medical management. He favors this approach as well. I will see him back in 12 months unless his symptoms progress. He will notify us if that occurs.  3. Hyperlipidemia. Managed by Dr. Cato Mulligan. Recent lipids were reviewed and he is on appropriate therapy. We reviewed his recent lab work and his hemoglobin A1c was elevated at 6.9. He was referred to a dietitian by Dr Cato Mulligan. He understands the importance of this referral as well as the need to make dietary changes so that he doesn't have progressive difficulty with diabetes.  For followup I will see him back in 12 months unless problems arise.  Tonny Bollman 06/07/2012 9:51 AM

## 2012-06-07 NOTE — Patient Instructions (Addendum)
Your physician wants you to follow-up in: 12 months.  You will receive a reminder letter in the mail two months in advance. If you don't receive a letter, please call our office to schedule the follow-up appointment.  Your physician recommends that you continue on your current medications as directed. Please refer to the Current Medication list given to you today.   

## 2012-06-19 ENCOUNTER — Other Ambulatory Visit: Payer: Self-pay | Admitting: *Deleted

## 2012-06-19 MED ORDER — ROSUVASTATIN CALCIUM 20 MG PO TABS: ORAL_TABLET | ORAL | Status: DC

## 2012-06-25 ENCOUNTER — Encounter: Payer: Medicare Other | Attending: Internal Medicine | Admitting: *Deleted

## 2012-06-25 ENCOUNTER — Encounter: Payer: Self-pay | Admitting: *Deleted

## 2012-06-25 VITALS — Ht 65.5 in | Wt 163.2 lb

## 2012-06-25 DIAGNOSIS — E119 Type 2 diabetes mellitus without complications: Secondary | ICD-10-CM | POA: Diagnosis not present

## 2012-06-25 DIAGNOSIS — Z713 Dietary counseling and surveillance: Secondary | ICD-10-CM | POA: Diagnosis not present

## 2012-06-25 NOTE — Progress Notes (Signed)
  Medical Nutrition Therapy:  Appt start time: 0915 end time:  1015.  Assessment:  Primary concerns today: patient here for new diagnosis of diabetes. Lives with brother in law most of the time, they cook together or eat out together. Brother in law also has diabetes and hx of a stroke. Patient is inactive due to COPD. He states he has 2-3 Bourbons at night to help him sleep, does not mix with anything, just follows with water.  MEDICATIONS: see list. No diabetes medications at this time   DIETARY INTAKE: Usual eating pattern includes 3 meals and 0 snacks per day.  Everyday foods include fair variety of all food groups.  Avoided foods include: none stated.    24-hr recall:  B ( AM):2 toast with jelly with 2-3 cups coffee with non dairy creamer and 1/2 tsp sugar  Snk ( AM): no  L ( PM): go get a salad, burger or hot dog, water or unsweet tea Snk ( PM): no D ( PM): restaurant: meat or fish, starch, vegetables occasionally, bread, fruit dessert, water or unsweetened tea Snk ( PM): no unless fresh fruit Beverages: coffee,  water or unsweetened tea  Usual physical activity: none due to COPD  Estimated energy needs: 1500 calories 170 g carbohydrates 112 g protein 42 g fat  Progress Towards Goal(s):  In progress.   Nutritional Diagnosis:  NB-1.1 Food and nutrition-related knowledge deficit As related to new diagnosis of diabetes.  As evidenced by A1c of 6.9% on 06/04/2012.    Intervention:  Nutrition counseling and diabetes education initiated. Discussed basic physiology of diabetes, SMBG and rationale of checking BG at alternate times of day, A1c, Carb Counting and reading food labels, and benefits of increased activity. Plan:  Aim for 3 Carb Choices per meal (45 grams) +/- 1 either way  Aim for 0-1 Carbs per snack if hungry  Consider reading food labels for Total Carbohydrate of foods Consider asking MD about getting a Rx for a meter and then checking BG at alternate times per day    Handouts given during visit include: Living Well with Diabetes Carb Counting and Food Label handouts Meal Plan Card  Monitoring/Evaluation:  Dietary intake, exercise, reading food labels, and body weight prn.

## 2012-06-25 NOTE — Patient Instructions (Signed)
Plan:  Aim for 3 Carb Choices per meal (45 grams) +/- 1 either way  Aim for 0-1 Carbs per snack if hungry  Consider reading food labels for Total Carbohydrate of foods Consider asking MD about getting a Rx for a meter and then checking BG at alternate times per day

## 2012-07-02 DIAGNOSIS — H3530 Unspecified macular degeneration: Secondary | ICD-10-CM | POA: Diagnosis not present

## 2012-07-02 DIAGNOSIS — H26499 Other secondary cataract, unspecified eye: Secondary | ICD-10-CM | POA: Diagnosis not present

## 2012-07-02 DIAGNOSIS — H5200 Hypermetropia, unspecified eye: Secondary | ICD-10-CM | POA: Diagnosis not present

## 2012-07-02 DIAGNOSIS — H04129 Dry eye syndrome of unspecified lacrimal gland: Secondary | ICD-10-CM | POA: Diagnosis not present

## 2012-07-26 ENCOUNTER — Other Ambulatory Visit: Payer: Self-pay | Admitting: *Deleted

## 2012-07-26 MED ORDER — LOSARTAN POTASSIUM 100 MG PO TABS: ORAL_TABLET | ORAL | Status: DC

## 2012-07-26 MED ORDER — PANTOPRAZOLE SODIUM 40 MG PO TBEC: DELAYED_RELEASE_TABLET | ORAL | Status: DC

## 2012-07-27 ENCOUNTER — Other Ambulatory Visit: Payer: Self-pay | Admitting: *Deleted

## 2012-07-27 MED ORDER — FUROSEMIDE 40 MG PO TABS: 40.0000 mg | ORAL_TABLET | Freq: Every day | ORAL | Status: DC

## 2012-07-27 MED ORDER — POTASSIUM CHLORIDE CRYS ER 20 MEQ PO TBCR: 20.0000 meq | EXTENDED_RELEASE_TABLET | Freq: Every day | ORAL | Status: DC

## 2012-09-03 ENCOUNTER — Encounter: Payer: Self-pay | Admitting: Internal Medicine

## 2012-09-03 ENCOUNTER — Ambulatory Visit (INDEPENDENT_AMBULATORY_CARE_PROVIDER_SITE_OTHER): Payer: Medicare Other | Admitting: Internal Medicine

## 2012-09-03 VITALS — BP 140/88 | HR 111 | Temp 97.5°F | Ht 64.0 in | Wt 163.0 lb

## 2012-09-03 DIAGNOSIS — J449 Chronic obstructive pulmonary disease, unspecified: Secondary | ICD-10-CM | POA: Diagnosis not present

## 2012-09-03 DIAGNOSIS — J4489 Other specified chronic obstructive pulmonary disease: Secondary | ICD-10-CM

## 2012-09-03 NOTE — Patient Instructions (Addendum)
If you are satisfied with your treatment plan let your doctor know and he/she can either refill your medications or you can return here when your prescription runs out.     If in any way you are not 100% satisfied,  please tell us.  If 100% better, tell your friends!  

## 2012-09-03 NOTE — Assessment & Plan Note (Addendum)
-   PFT's 01/28/10 FEV1 1.16 46% ratio 44 with 27% improvement p B2, DLCO 76%  - HFA 90% 05/31/2012  - Walked 3 laps 05/31/12 @ 185 ft each stopped due to  End of study, no desats - Misplaced med calendar 07/29/2010 and not using spiriva q am as instructed. - PFT's 02/17/12  FEV1  1.20 (49%) ratio 45 and no better p B2 with dlco 64 corrects to 73  Reasonable quality of life despite severity of copd with min need for saba and tends to get confused with more complex instructions so reasonable to keep things as simple as possible including contingency meds    Each maintenance medication was reviewed in detail including most importantly the difference between maintenance and as needed and under what circumstances the prns are to be used.  Please see instructions for details which were reviewed in writing and the patient given a copy.    Pulmonary f/u can be prn

## 2012-09-03 NOTE — Progress Notes (Signed)
Subjective:    Patient ID: [name], male    DOB: [birth year]    MRN: [id number]   Brief patient profile:  72 yowm quit smoking July 2011 at CABG and did fine in terms of improved ex tol > walk easily on flat grade [place] with w/u c/w GOLD II COPD PFT's 01/28/10 FEV1 1.16 46% ratio 44 with 27% improvement p B2 easily confused with meds/ instructions and given calendar but not using     March 11, 2010-- med review. He has brought all his meds in today. He is taking symbicort only once daily (not two times a day ) and has not started on pepcid at bedtime. Last ov changed off ace /norvasc now on bystolic. Cough is better w/ no further hemoptysis but not totally gone. Xray last ov showed persistent patchy and nodular airspace disease in the left lung. I will call with chest xray results.  Follow med calenadar closley and bring to each visit.  add pepcid 20mg  at bedtime  Increase Symbicort 2 puffs two times a day    April 23, 2010 ov cough and sob no better and throat very sore, no better p rx with rx as uri with doxy. mild dysphagia, mod hoarseness rec .stop symbicort  Try xopenex in place of ventolin  Dulera 100 2 bid Work on inhaler technique  See calendar for specific medication instructions > lost it    05/31/2012 f/u ov/Zachry Hopfensperger cc improved but still can't do steps s sob and getting over a cold with increase cough in am mucoid, no longer on bid ppi  Has not max'd mdm or tried the xopenex for the cough or felt he needed it for breathing and overall happy with his functional status. rec When coughing>  Add pepcid ac 20 mg at bedtime and push mucinex dm up to 1200 mg every 12 hours> did not feel he needed it    09/03/2012 f/u ov/Jamespaul Secrist re copd Chief Complaint  Patient presents with  . Follow-up    Pt states DOE may be slightly better since the last visit. No new co's today.    using spiriva and dulera 200 and some xopenex in afternoon's if overdoes it in the yard.  No obvious daytime  variabilty or cough cp or chest tightness, subjective wheeze overt sinus or hb symptoms. No unusual exp hx    Sleeping ok without nocturnal   exacerbation  of respiratory  c/o's or need for noct saba. Also denies any obvious fluctuation of symptoms with weather or environmental changes or other aggravating or alleviating factors except as outlined above   ROS  The following are not active complaints unless bolded sore throat, dysphagia, dental problems, itching, sneezing,  nasal congestion or excess/ purulent secretions, ear ache,   fever, chills, sweats, unintended wt loss, pleuritic or exertional cp, hemoptysis,  orthopnea pnd or leg swelling, presyncope, palpitations, heartburn, abdominal pain, anorexia, nausea, vomiting, diarrhea  or change in bowel or urinary habits, change in stools or urine, dysuria,hematuria,  rash, arthralgias, visual complaints, headache, numbness weakness or ataxia or problems with walking or coordination,  change in mood/affect or memory.            Past Medical History: Allergic rhinitis  Osteoporosis  1. Coronary artery disease status post multiple percutaneous coronary  inventions > CABG 09/2009  2. Peripheral vascular status post previous stenting of the totally  occluded iliac and status post stenting of an external iliac in  January 2009 with recurrent  symptoms of questionable claudication.  3. Hypertension.  4. Hyperlipidemia.  5. History of amaurosis fugax 1998.  6. Gastroesophageal reflux disease.  anemia, B12 deficiency  AFIB--post CABG  Atrial fibrillation  COPD  - PFT's 01/28/10 FEV1 1.16 46% ratio 44 with 27% improvement p B2, DLCO 76%  - HFA 50% p coaching February 25, 2010 > 75% April 23, 2010 > 75%   Hemoptysis onset 01/2010  - CT chest 02/19/10 : no PE, no ca/ nodular infiltrates > f/u 03/11/10 cxr no nodules  COMPLEX MED REGIMEN Meds reviewed with pt education and computerized  med calendar completed March 11, 2010  08/12/10       Objective:   Physical Exam  GEN: A/Ox3; pleasant , NAD, well nourished   Wt 02/21/2012  163 > 164 05/31/2012 > 09/03/2012  163  Wt Readings from Last 3 Encounters:  11/11/11 164 lb (74.39 kg)  10/21/11 165 lb (74.844 kg)  08/24/11 165 lb (74.844 kg)     HEENT mild turbinate edema.  Oropharynx no thrush or excess pnd or cobblestoning.  No JVD or cervical adenopathy. Mild accessory muscle hypertrophy. Trachea midline, nl thryroid. Chest was hyperinflated by percussion with diminished breath sounds and moderate increased exp time without wheeze. Hoover sign positive at mid inspiration. Regular rate and rhythm without murmur gallop or rub or increase P2 or edema.  Abd: no hsm, nl excursion. Ext warm without cyanosis or clubbing.      CXR  02/21/2012 :  No acute cardiopulmonary disease.  Background changes of underlying COPD again noted      Assessment & Plan:

## 2012-11-28 ENCOUNTER — Other Ambulatory Visit: Payer: Self-pay | Admitting: Internal Medicine

## 2012-11-29 ENCOUNTER — Other Ambulatory Visit: Payer: Self-pay | Admitting: *Deleted

## 2012-11-29 MED ORDER — TIOTROPIUM BROMIDE MONOHYDRATE 18 MCG IN CAPS: 18.0000 ug | ORAL_CAPSULE | Freq: Every day | RESPIRATORY_TRACT | Status: DC

## 2012-12-30 ENCOUNTER — Other Ambulatory Visit: Payer: Self-pay | Admitting: Internal Medicine

## 2013-01-10 ENCOUNTER — Ambulatory Visit (INDEPENDENT_AMBULATORY_CARE_PROVIDER_SITE_OTHER): Payer: Medicare Other

## 2013-01-10 DIAGNOSIS — Z23 Encounter for immunization: Secondary | ICD-10-CM

## 2013-02-10 ENCOUNTER — Other Ambulatory Visit: Payer: Self-pay | Admitting: Internal Medicine

## 2013-02-16 ENCOUNTER — Other Ambulatory Visit: Payer: Self-pay | Admitting: Internal Medicine

## 2013-03-04 ENCOUNTER — Encounter: Payer: Self-pay | Admitting: Internal Medicine

## 2013-03-04 ENCOUNTER — Ambulatory Visit (INDEPENDENT_AMBULATORY_CARE_PROVIDER_SITE_OTHER): Payer: Medicare Other | Admitting: Internal Medicine

## 2013-03-04 VITALS — BP 138/85 | HR 68 | Temp 98.0°F | Ht 64.0 in | Wt 158.0 lb

## 2013-03-04 DIAGNOSIS — I10 Essential (primary) hypertension: Secondary | ICD-10-CM

## 2013-03-04 DIAGNOSIS — R7309 Other abnormal glucose: Secondary | ICD-10-CM

## 2013-03-04 DIAGNOSIS — E785 Hyperlipidemia, unspecified: Secondary | ICD-10-CM

## 2013-03-04 DIAGNOSIS — I2510 Atherosclerotic heart disease of native coronary artery without angina pectoris: Secondary | ICD-10-CM | POA: Diagnosis not present

## 2013-03-04 DIAGNOSIS — F17200 Nicotine dependence, unspecified, uncomplicated: Secondary | ICD-10-CM

## 2013-03-04 DIAGNOSIS — J449 Chronic obstructive pulmonary disease, unspecified: Secondary | ICD-10-CM

## 2013-03-04 DIAGNOSIS — R739 Hyperglycemia, unspecified: Secondary | ICD-10-CM

## 2013-03-04 NOTE — Progress Notes (Signed)
COPD- reviewed Dr. Thurston Hole note. He is not smoking but around people who smoke all of the time.   CAD- no sxs of CP, PND, Orthopnea  Hx of tobacco use- non smoker now  htn- tolerating meds. Home bps 120s/75-85  Lipids- tolerating meds  Hyperglycemia- last a1c 6.9%  Past Medical History  Diagnosis Date  . HYPERLIPIDEMIA 11/21/2006  . HYPERTENSION 11/21/2006  . MYOCARDIAL INFARCTION, HX OF 11/21/2006  . CORONARY ARTERY DISEASE 11/21/2006  . OSTEOPOROSIS 11/21/2006  . COLONIC POLYPS 05/21/2007  . COPD 05/21/2007  . HIATAL HERNIA 05/21/2007  . DIVERTICULAR DISEASE 05/21/2007  . NEPHROLITHIASIS 05/21/2007  . GI BLEEDING 09/09/2008  . ANEMIA, B12 DEFICIENCY 12/02/2008  . CAROTID ARTERY STENOSIS 04/16/2009  . PERIPHERAL VASCULAR DISEASE 09/02/2009  . Atrial fibrillation 11/06/2009  . Allergic rhinitis   . Osteoporosis   . Hemoptysis   . Internal hemorrhoids   . GERD (gastroesophageal reflux disease)   . Cataract   . Diabetes mellitus without complication     History   Social History  . Marital Status: Widowed    Spouse Name: N/A    Number of Children: N/A  . Years of Education: N/A   Occupational History  . retired from [employer] and [employer]    Social History Main Topics  . Smoking status: Former Smoker -- 3.00 packs/day for 50 years    Types: Cigarettes    Quit date: 09/25/2009  . Smokeless tobacco: Never Used  . Alcohol Use: 3.0 oz/week    6 drink(s) per week     Comment: Bourbon 2-3 a night  . Drug Use: No  . Sexual Activity: Not on file   Other Topics Concern  . Not on file   Social History Narrative   Regular exercise - no    Past Surgical History  Procedure Laterality Date  . Thyroidectomy    . Coronary stent placement      03/07/2001  . Cholecystectomy    . Appendectomy    . Hemorrhoid surgery    . Inguinal hernia repair  10/10/08  . Coronary artery bypass graft  2011  . Ptca      Family History  Problem Relation Age of Onset  . Arthritis Mother    rheumatoid  . Heart attack Father   . Heart disease Father   . Heart disease Brother   . Colon cancer Brother 11  . Stomach cancer Neg Hx   . Heart disease Brother     Allergies  Allergen Reactions  . Ticlopidine Hcl     REACTION: swelling    Current Outpatient Prescriptions on File Prior to Visit  Medication Sig Dispense Refill  . aspirin 81 MG tablet Take 81 mg by mouth daily.        . Calcium Carbonate-Vit D-Min (CALTRATE 600+D PLUS) 600-400 MG-UNIT per tablet Chew 1 tablet by mouth daily.        . cetirizine (ZYRTEC ALLERGY) 10 MG tablet Take 1 tablet (10 mg total) by mouth every morning.      Marland Kitchen CRESTOR 20 MG tablet TAKE 1/2 TAB BY MOUTH EVERY MORNING  30 tablet  3  . cyanocobalamin ([employer] VITAMIN B12) 2000 MCG tablet Take 1 tablet (2,000 mcg total) by mouth daily.      Marland Kitchen dextromethorphan-guaiFENesin (MUCINEX DM) 30-600 MG per 12 hr tablet Take 1 tablet by mouth every 12 (twelve) hours as needed. cough       . DULERA 100-5 MCG/ACT AERO INHALE 2 PUFFS INTO THE LUNGS TWICE  A DAY  13 g  2  . furosemide (LASIX) 40 MG tablet Take 1 tablet (40 mg total) by mouth daily.  90 tablet  3  . losartan (COZAAR) 100 MG tablet TAKE 1 TABLET BY MOUTH EVERY DAY  90 tablet  1  . pantoprazole (PROTONIX) 40 MG tablet TAKE 1 TABLET BY MOUTH TWICE A DAY  180 tablet  1  . potassium chloride SA (KLOR-CON M20) 20 MEQ tablet Take 1 tablet (20 mEq total) by mouth daily.  90 tablet  3  . tiotropium (SPIRIVA) 18 MCG inhalation capsule Place 1 capsule (18 mcg total) into inhaler and inhale daily.  30 capsule  3  . triamcinolone (KENALOG) 0.025 % ointment APPLY 1 A SMALL AMOUNT TO AFFECTED AREA TWICE A DAY  80 g  2  . XOPENEX HFA 45 MCG/ACT inhaler INHALE 2 PUFFS INTO THE LUNGS EVERY 4 HOURS AS NEEDED  15 g  2   No current facility-administered medications on file prior to visit.     patient denies chest pain, shortness of breath, orthopnea. Denies lower extremity edema, abdominal pain, change in appetite,  change in bowel movements. Patient denies rashes, musculoskeletal complaints. No other specific complaints in a complete review of systems.   BP 138/85  Pulse 68  Temp(Src) 98 F (36.7 C) (Oral)  Ht 5\' 4"  (1.626 m)  Wt 158 lb (71.668 kg)  BMI 27.11 kg/m2  well-developed well-nourished male in no acute distress. HEENT exam atraumatic, normocephalic, neck supple without jugular venous distention. Chest clear to auscultation cardiac exam S1-S2 are regular. Abdominal exam overweight with bowel sounds, soft and nontender. Extremities no edema. Neurologic exam is alert with a normal gait.

## 2013-03-04 NOTE — Progress Notes (Signed)
Pre visit review using our clinic review tool, if applicable. No additional management support is needed unless otherwise documented below in the visit note. 

## 2013-03-04 NOTE — Assessment & Plan Note (Signed)
Quit 2012 (approximately)

## 2013-03-06 NOTE — Assessment & Plan Note (Signed)
Patient denies any symptoms. He has regular followup with cardiology. Will continue risk factor modification.

## 2013-03-06 NOTE — Assessment & Plan Note (Signed)
Previously controlled. Continue same medications.

## 2013-03-06 NOTE — Assessment & Plan Note (Signed)
BP Readings from Last 3 Encounters:  03/04/13 138/85  09/03/12 140/88  06/07/12 118/64   Fair control. Continue same medications.

## 2013-06-14 ENCOUNTER — Ambulatory Visit (INDEPENDENT_AMBULATORY_CARE_PROVIDER_SITE_OTHER): Payer: Medicare Other | Admitting: Cardiovascular Disease

## 2013-06-14 ENCOUNTER — Encounter: Payer: Self-pay | Admitting: Cardiovascular Disease

## 2013-06-14 VITALS — BP 133/80 | HR 78 | Ht 66.0 in | Wt 155.0 lb

## 2013-06-14 DIAGNOSIS — I10 Essential (primary) hypertension: Secondary | ICD-10-CM

## 2013-06-14 DIAGNOSIS — E785 Hyperlipidemia, unspecified: Secondary | ICD-10-CM

## 2013-06-14 DIAGNOSIS — I252 Old myocardial infarction: Secondary | ICD-10-CM | POA: Diagnosis not present

## 2013-06-14 DIAGNOSIS — I6529 Occlusion and stenosis of unspecified carotid artery: Secondary | ICD-10-CM

## 2013-06-14 DIAGNOSIS — I2510 Atherosclerotic heart disease of native coronary artery without angina pectoris: Secondary | ICD-10-CM | POA: Diagnosis not present

## 2013-06-14 DIAGNOSIS — R42 Dizziness and giddiness: Secondary | ICD-10-CM

## 2013-06-14 DIAGNOSIS — I4891 Unspecified atrial fibrillation: Secondary | ICD-10-CM

## 2013-06-14 NOTE — Progress Notes (Signed)
HPI:  73 year old gentleman returning for followup of coronary artery disease with history of CABG in 2011. The patient also has PAD and has undergone iliac stenting. He remains most limited by exertional dyspnea related primarily to COPD. He is a long-time smoker, but now has quit.  He complains of ostial dizziness. When he first stands up he feels lightheaded and then has to sit down. When he checks his pulse sometimes as low as 38-40 beats per minute. He has not had frank syncope. He denies central chest pain or pressure, but does complain of a "catch" in his left chest that occurs intermittently. There is no relation to physical exertion. These have been self-limited and resolve within 5-10 minutes. He also complains of midline abdominal hernias that have become tender to palpation. No edema, orthopnea, or PND. No symptoms of typical leg or calf claudication.  Outpatient Encounter Prescriptions as of 06/14/2013  Medication Sig  . aspirin 81 MG tablet Take 81 mg by mouth daily.    . Calcium Carbonate-Vit D-Min (CALTRATE 600+D PLUS) 600-400 MG-UNIT per tablet Chew 1 tablet by mouth daily.    . cetirizine (ZYRTEC ALLERGY) 10 MG tablet Take 1 tablet (10 mg total) by mouth every morning.  Marland Kitchen CRESTOR 20 MG tablet TAKE 1/2 TAB BY MOUTH EVERY MORNING  . cyanocobalamin ([facility] VITAMIN B12) 2000 MCG tablet Take 1 tablet (2,000 mcg total) by mouth daily.  Marland Kitchen dextromethorphan-guaiFENesin (MUCINEX DM) 30-600 MG per 12 hr tablet Take 1 tablet by mouth every 12 (twelve) hours as needed. cough   . DULERA 100-5 MCG/ACT AERO INHALE 2 PUFFS INTO THE LUNGS TWICE A DAY  . furosemide (LASIX) 40 MG tablet Take 1 tablet (40 mg total) by mouth daily.  Marland Kitchen losartan (COZAAR) 100 MG tablet TAKE 1 TABLET BY MOUTH EVERY DAY  . pantoprazole (PROTONIX) 40 MG tablet TAKE 1 TABLET BY MOUTH TWICE A DAY  . potassium chloride SA (KLOR-CON M20) 20 MEQ tablet Take 1 tablet (20 mEq total) by mouth daily.  Marland Kitchen tiotropium (SPIRIVA) 18 MCG  inhalation capsule Place 1 capsule (18 mcg total) into inhaler and inhale daily.  Marland Kitchen triamcinolone (KENALOG) 0.025 % ointment APPLY 1 A SMALL AMOUNT TO AFFECTED AREA TWICE A DAY  . XOPENEX HFA 45 MCG/ACT inhaler INHALE 2 PUFFS INTO THE LUNGS EVERY 4 HOURS AS NEEDED    Allergies  Allergen Reactions  . Ticlopidine Hcl     REACTION: swelling    Past Medical History  Diagnosis Date  . HYPERLIPIDEMIA 11/21/2006  . HYPERTENSION 11/21/2006  . MYOCARDIAL INFARCTION, HX OF 11/21/2006  . CORONARY ARTERY DISEASE 11/21/2006  . OSTEOPOROSIS 11/21/2006  . COLONIC POLYPS 05/21/2007  . COPD 05/21/2007  . HIATAL HERNIA 05/21/2007  . DIVERTICULAR DISEASE 05/21/2007  . NEPHROLITHIASIS 05/21/2007  . GI BLEEDING 09/09/2008  . ANEMIA, B12 DEFICIENCY 12/02/2008  . CAROTID ARTERY STENOSIS 04/16/2009  . PERIPHERAL VASCULAR DISEASE 09/02/2009  . Atrial fibrillation 11/06/2009  . Allergic rhinitis   . Osteoporosis   . Hemoptysis   . Internal hemorrhoids   . GERD (gastroesophageal reflux disease)   . Cataract   . Diabetes mellitus without complication     ROS: Negative except as per HPI  BP 133/80  Pulse 78  Ht 5\' 6"  (1.676 m)  Wt 155 lb (70.308 kg)  BMI 25.03 kg/m2  PHYSICAL EXAM: Pt is alert and oriented, pleasant elderly male in NAD HEENT: normal Neck: JVP - normal, carotids 2+= without bruits Lungs: Prolonged expiratory phase, otherwise clear  CV: RRR without murmur or gallop, distant heart sounds Abd: soft, NT, Positive BS, no hepatomegaly Ext: no C/C/E, distal pulses intact and equal Skin: warm/dry no rash  EKG:  Normal sinus rhythm 77 beats per minute, within normal limits.  ASSESSMENT AND PLAN: 1. Coronary artery disease status post CABG. No typical symptoms of angina. Will continue with risk reduction measures which include aspirin and Crestor. He will followup in 6 months.  2. Chronic dyspnea. May be multi-factorial, but I suspect the primary issue is his COPD. He is pretty limited and had to  stop 2-3 times just on his walk into the office today because of his breathing. Followup echocardiography after his CABG showed normal LV function.  3. Near syncope. Symptoms are concerning and findings of low heart rate associated raises suspicion of a cardiac etiology. Going to check an event recorder. There also is a postural component and I have recommended that he increase fluid intake significantly.  4. Hypertension. Blood pressure is controlled on losartan 100 mg daily.  5. Hyperlipidemia. The patient continues on Crestor. Last LDL was 74.  Sherren Mocha 06/14/2013 12:18 PM

## 2013-06-14 NOTE — Patient Instructions (Signed)
Your physician recommends that you continue on your current medications as directed. Please refer to the Current Medication list given to you today.  Your physician has recommended that you wear an event monitor. Event monitors are medical devices that record the heart's electrical activity. Doctors most often [place] these monitors to diagnose arrhythmias. Arrhythmias are problems with the speed or rhythm of the heartbeat. The monitor is a small, portable device. You can wear one while you do your normal daily activities. This is usually used to diagnose what is causing palpitations/syncope (passing out).  Your physician wants you to follow-up in: 6 months with Dr. Burt Knack.  You will receive a reminder letter in the mail two months in advance. If you don't receive a letter, please call our office to schedule the follow-up appointment.

## 2013-06-15 ENCOUNTER — Other Ambulatory Visit: Payer: Self-pay | Admitting: Cardiovascular Disease

## 2013-06-17 ENCOUNTER — Encounter (INDEPENDENT_AMBULATORY_CARE_PROVIDER_SITE_OTHER): Payer: Medicare Other

## 2013-06-17 ENCOUNTER — Encounter: Payer: Self-pay | Admitting: *Deleted

## 2013-06-17 DIAGNOSIS — R42 Dizziness and giddiness: Secondary | ICD-10-CM | POA: Diagnosis not present

## 2013-06-17 NOTE — Progress Notes (Signed)
Patient ID: [name], male   DOB: Aug 04, 1940, 73 y.o.   MRN: [id number] E-Cardio verite 21 day cardiac event monitor applied to patient.

## 2013-07-13 ENCOUNTER — Other Ambulatory Visit: Payer: Self-pay | Admitting: Cardiovascular Disease

## 2013-07-16 ENCOUNTER — Encounter: Payer: Self-pay | Admitting: Cardiovascular Disease

## 2013-07-16 NOTE — Telephone Encounter (Signed)
New Message  Pt calling for results// Please call

## 2013-07-16 NOTE — Telephone Encounter (Signed)
This encounter was created in error - please disregard.

## 2013-08-07 DIAGNOSIS — E119 Type 2 diabetes mellitus without complications: Secondary | ICD-10-CM | POA: Diagnosis not present

## 2013-08-07 DIAGNOSIS — H2510 Age-related nuclear cataract, unspecified eye: Secondary | ICD-10-CM | POA: Diagnosis not present

## 2013-08-07 LAB — HM DIABETES EYE EXAM

## 2013-08-17 ENCOUNTER — Other Ambulatory Visit: Payer: Self-pay | Admitting: Internal Medicine

## 2013-08-24 ENCOUNTER — Other Ambulatory Visit: Payer: Self-pay | Admitting: Internal Medicine

## 2013-10-12 ENCOUNTER — Other Ambulatory Visit: Payer: Self-pay | Admitting: Cardiovascular Disease

## 2013-10-28 ENCOUNTER — Telehealth: Payer: Self-pay | Admitting: Internal Medicine

## 2013-10-28 NOTE — Telephone Encounter (Signed)
error 

## 2013-11-11 ENCOUNTER — Ambulatory Visit: Payer: Medicare Other | Admitting: Family Medicine

## 2013-12-11 ENCOUNTER — Encounter: Payer: Self-pay | Admitting: Cardiovascular Disease

## 2013-12-11 ENCOUNTER — Ambulatory Visit (INDEPENDENT_AMBULATORY_CARE_PROVIDER_SITE_OTHER): Payer: Medicare Other | Admitting: Cardiovascular Disease

## 2013-12-11 VITALS — BP 120/80 | HR 94 | Ht 66.0 in | Wt 157.0 lb

## 2013-12-11 DIAGNOSIS — I2510 Atherosclerotic heart disease of native coronary artery without angina pectoris: Secondary | ICD-10-CM

## 2013-12-11 DIAGNOSIS — I10 Essential (primary) hypertension: Secondary | ICD-10-CM | POA: Diagnosis not present

## 2013-12-11 DIAGNOSIS — E785 Hyperlipidemia, unspecified: Secondary | ICD-10-CM

## 2013-12-11 NOTE — Progress Notes (Signed)
HPI:   73 year old gentleman returning for followup of coronary artery disease with history of CABG in 2011. The patient also has PAD and has undergone iliac stenting. He remains most limited by exertional dyspnea related primarily to COPD. He is a long-time smoker, but now has quit.  I saw him earlier this year, he complained of near syncope and bradycardia. An event monitor was arranged and this demonstrated sinus rhythm with PVCs. There were no sustained brady- or tachyarrhythmias.  Last lipids March 2014: Lipid Panel     Component Value Date/Time   CHOL 135 06/04/2012 0920   TRIG 109.0 06/04/2012 0920   HDL 38.80* 06/04/2012 0920   CHOLHDL 3 06/04/2012 0920   VLDL 21.8 06/04/2012 0920   LDLCALC 74 06/04/2012 0920    The patient is doing fairly well. He still has some episodes of postural lightheadedness, but he has adapted to this. He's had problems with seasonal allergies, but has no other changes to report. Chronic dyspnea is unchanged. No chest pain, leg swelling, orthopnea, PND, or heart palpitations.  Outpatient Encounter Prescriptions as of 12/11/2013  Medication Sig  . aspirin 81 MG tablet Take 81 mg by mouth daily.    . Calcium Carbonate-Vit D-Min (CALTRATE 600+D PLUS) 600-400 MG-UNIT per tablet Chew 1 tablet by mouth daily.    . cetirizine (ZYRTEC ALLERGY) 10 MG tablet Take 1 tablet (10 mg total) by mouth every morning.  Marland Kitchen CRESTOR 20 MG tablet TAKE 1/2 TAB BY MOUTH EVERY MORNING  . cyanocobalamin ([facility] VITAMIN B12) 2000 MCG tablet Take 1 tablet (2,000 mcg total) by mouth daily.  Marland Kitchen dextromethorphan-guaiFENesin (MUCINEX DM) 30-600 MG per 12 hr tablet Take 1 tablet by mouth every 12 (twelve) hours as needed. cough   . DULERA 100-5 MCG/ACT AERO INHALE 2 PUFFS INTO THE LUNGS TWICE A DAY  . furosemide (LASIX) 40 MG tablet TAKE 1 TABLET BY MOUTH DAILY  . KLOR-CON M20 20 MEQ tablet TAKE 1 TABLET BY MOUTH DAILY  . losartan (COZAAR) 100 MG tablet TAKE 1 TABLET BY MOUTH EVERY DAY  .  pantoprazole (PROTONIX) 40 MG tablet TAKE 1 TABLET BY MOUTH TWICE A DAY  . tiotropium (SPIRIVA) 18 MCG inhalation capsule Place 1 capsule (18 mcg total) into inhaler and inhale daily.  Marland Kitchen triamcinolone (KENALOG) 0.025 % ointment APPLY 1 A SMALL AMOUNT TO AFFECTED AREA TWICE A DAY  . XOPENEX HFA 45 MCG/ACT inhaler INHALE 2 PUFFS INTO THE LUNGS EVERY 4 HOURS AS NEEDED    Allergies  Allergen Reactions  . Ticlopidine Hcl     REACTION: swelling    Past Medical History  Diagnosis Date  . HYPERLIPIDEMIA 11/21/2006  . HYPERTENSION 11/21/2006  . MYOCARDIAL INFARCTION, HX OF 11/21/2006  . CORONARY ARTERY DISEASE 11/21/2006  . OSTEOPOROSIS 11/21/2006  . COLONIC POLYPS 05/21/2007  . COPD 05/21/2007  . HIATAL HERNIA 05/21/2007  . DIVERTICULAR DISEASE 05/21/2007  . NEPHROLITHIASIS 05/21/2007  . GI BLEEDING 09/09/2008  . ANEMIA, B12 DEFICIENCY 12/02/2008  . CAROTID ARTERY STENOSIS 04/16/2009  . PERIPHERAL VASCULAR DISEASE 09/02/2009  . Atrial fibrillation 11/06/2009  . Allergic rhinitis   . Osteoporosis   . Hemoptysis   . Internal hemorrhoids   . GERD (gastroesophageal reflux disease)   . Cataract   . Diabetes mellitus without complication     ROS: Negative except as per HPI  BP 120/80  Pulse 94  Ht 5\' 6"  (1.676 m)  Wt 157 lb (71.215 kg)  BMI 25.35 kg/m2  PHYSICAL EXAM: Pt is  alert and oriented, pleasant elderly man in NAD HEENT: normal Neck: JVP - normal, carotids 2+= without bruits Lungs: CTA bilaterally, prolonged expiratory phase CV: RRR without murmur or gallop, distant heart sound Abd: soft, NT, Positive BS, no hepatomegaly Ext: no C/C/E, distal pulses intact and equal Skin: warm/dry no rash  EKG:  Normal sinus rhythm 94 beats per minute, age-indeterminate septal infarct, nonspecific T wave abnormality.  ASSESSMENT AND PLAN: 1. Coronary artery disease status post CABG. no anginal symptoms. Medications reviewed and will continue the same. No beta blocker because of history of  bradycardia.  2. Chronic dyspnea. COPD appears stable.  3. Near syncope. Event monitor reviewed. No significant arrhythmias. Continue precautions with postural changes.  4. Hypertension. Blood pressure is controlled on losartan 100 mg daily.   5. Hyperlipidemia. The patient continues on Crestor. Last LDL was 74. He is due for repeat labs. He establishes with Dr. Yong Channel in November and anticipates a full lab panel associated with that visit.   Sherren Mocha 12/11/2013 9:27 AM

## 2013-12-11 NOTE — Patient Instructions (Signed)
Your physician wants you to follow-up in: 6 MONTHS with Dr Cooper.  You will receive a reminder letter in the mail two months in advance. If you don't receive a letter, please call our office to schedule the follow-up appointment.  Your physician recommends that you continue on your current medications as directed. Please refer to the Current Medication list given to you today.  

## 2014-01-13 ENCOUNTER — Telehealth: Payer: Self-pay | Admitting: Internal Medicine

## 2014-01-13 MED ORDER — LEVALBUTEROL TARTRATE 45 MCG/ACT IN AERO: 2.0000 | INHALATION_SPRAY | RESPIRATORY_TRACT | Status: DC | PRN

## 2014-01-13 NOTE — Telephone Encounter (Signed)
Medication sent in. 

## 2014-01-13 NOTE — Telephone Encounter (Signed)
Pt has appt with dr hunter in nov 2015. Pt needs refill on xopenex inhaler call into [facility]

## 2014-01-23 ENCOUNTER — Other Ambulatory Visit: Payer: Self-pay | Admitting: Internal Medicine

## 2014-02-04 ENCOUNTER — Other Ambulatory Visit: Payer: Self-pay | Admitting: Internal Medicine

## 2014-02-04 ENCOUNTER — Other Ambulatory Visit: Payer: Self-pay | Admitting: Cardiovascular Disease

## 2014-02-06 ENCOUNTER — Ambulatory Visit (INDEPENDENT_AMBULATORY_CARE_PROVIDER_SITE_OTHER): Payer: Medicare Other | Admitting: Family Medicine

## 2014-02-06 ENCOUNTER — Ambulatory Visit (INDEPENDENT_AMBULATORY_CARE_PROVIDER_SITE_OTHER): Payer: Medicare Other

## 2014-02-06 ENCOUNTER — Encounter: Payer: Self-pay | Admitting: Family Medicine

## 2014-02-06 DIAGNOSIS — Z23 Encounter for immunization: Secondary | ICD-10-CM

## 2014-02-06 DIAGNOSIS — I480 Paroxysmal atrial fibrillation: Secondary | ICD-10-CM | POA: Diagnosis not present

## 2014-02-06 DIAGNOSIS — J309 Allergic rhinitis, unspecified: Secondary | ICD-10-CM | POA: Insufficient documentation

## 2014-02-06 DIAGNOSIS — I10 Essential (primary) hypertension: Secondary | ICD-10-CM

## 2014-02-06 DIAGNOSIS — J449 Chronic obstructive pulmonary disease, unspecified: Secondary | ICD-10-CM | POA: Diagnosis not present

## 2014-02-06 DIAGNOSIS — K219 Gastro-esophageal reflux disease without esophagitis: Secondary | ICD-10-CM | POA: Insufficient documentation

## 2014-02-06 DIAGNOSIS — Z66 Do not resuscitate: Secondary | ICD-10-CM

## 2014-02-06 DIAGNOSIS — I499 Cardiac arrhythmia, unspecified: Secondary | ICD-10-CM | POA: Diagnosis not present

## 2014-02-06 NOTE — Patient Instructions (Addendum)
Great to meet you!   Signed Do not resuscitate order for home. Post on your fridge. I would discuss with your son Teshawn if you should get him set up as [place] of attorney. Make sure he knows what your wishes are as well as your other children.   You do appear to have atrial fibrillation. I am going to send Dr. Burt Knack a message to see his opinion. You are at risk for stroke but we could consider just staying on aspirin instead of more aggressive medicine.

## 2014-02-06 NOTE — Progress Notes (Signed)
Jeffery Reddish, MD Phone: [phone]  Subjective:  Patient presents today to establish care with me as their new primary care provider. Patient was formerly a patient of Dr. Leanne Chang. Chief complaint-noted.   COPDGold III -reasonable control Symptoms are well-controlled with Dulera and Spiriva. Patient wanted to see if there were any cheaper options. We discussed inhalers tend to be expensive. He is able to walk down a hall without shortness breath. He is short of breath with stairs.  ROS-no chest pain, denies wheezing.   Atrial fibrillation-paroxysmal currently in a fib Follows with cardiology but was in sinus rhythm at last office visit. Patient was unaware of diagnosis. Slightly elevated on EKG but normal during initial check. He takes an aspirin daily. ROS- denies palpitations or chest pain  Hypertension-good control  BP Readings from Last 3 Encounters:  02/06/14 102/70  12/11/13 120/80  06/14/13 133/80  Home BP monitoring-no Compliant with medications-yes without side effects ROS-Denies any CP, HA, blurry vision.  The following were reviewed and entered/updated in epic: Past Medical History  Diagnosis Date  . HYPERLIPIDEMIA 11/21/2006  . HYPERTENSION 11/21/2006  . MYOCARDIAL INFARCTION, HX OF 11/21/2006  . CORONARY ARTERY DISEASE 11/21/2006  . OSTEOPOROSIS 11/21/2006  . COLONIC POLYPS 05/21/2007  . COPD 05/21/2007  . HIATAL HERNIA 05/21/2007  . DIVERTICULAR DISEASE 05/21/2007  . NEPHROLITHIASIS 05/21/2007  . GI BLEEDING 09/09/2008  . ANEMIA, B12 DEFICIENCY 12/02/2008  . CAROTID ARTERY STENOSIS 04/16/2009  . PERIPHERAL VASCULAR DISEASE 09/02/2009  . Atrial fibrillation 11/06/2009  . Allergic rhinitis   . Osteoporosis   . Hemoptysis   . Internal hemorrhoids   . GERD (gastroesophageal reflux disease)   . Cataract   . Diabetes mellitus without complication    Patient Active Problem List   Diagnosis Date Noted  . Do not resuscitate 02/06/2014    Priority: High  . Atrial  fibrillation 11/06/2009    Priority: Medium  . Peripheral vascular disease 09/02/2009    Priority: Medium  . Carotid artery stenosis 04/16/2009    Priority: Medium  . COPD GOLD III 05/21/2007    Priority: Medium  . TOBACCO USE 01/19/2007    Priority: Medium  . Hyperlipidemia 11/21/2006    Priority: Medium  . Essential hypertension 11/21/2006    Priority: Medium  . CAD with history MI 11/21/2006    Priority: Medium  . Osteoporosis 11/21/2006    Priority: Medium  . Allergic rhinitis 02/06/2014    Priority: Low  . GERD (gastroesophageal reflux disease) 02/06/2014    Priority: Low  . Erectile dysfunction 06/18/2010    Priority: Low  . ANEMIA, B12 DEFICIENCY 12/02/2008    Priority: Low   Past Surgical History  Procedure Laterality Date  . Thyroidectomy      partial  . Coronary stent placement      03/07/2001  . Cholecystectomy    . Appendectomy    . Hemorrhoid surgery    . Inguinal hernia repair  10/10/08  . Coronary artery bypass graft  2011  . Ptca      Family History  Problem Relation Age of Onset  . Arthritis Mother     rheumatoid  . Heart attack Father   . Heart disease Father   . Heart disease Brother   . Colon cancer Brother 53  . Stomach cancer Neg Hx   . Heart disease Brother     Medications- reviewed and updated Current Outpatient Prescriptions  Medication Sig Dispense Refill  . aspirin 81 MG tablet Take 81 mg by mouth  daily.      . Calcium Carbonate-Vit D-Min (CALTRATE 600+D PLUS) 600-400 MG-UNIT per tablet Chew 1 tablet by mouth daily.      . cetirizine (ZYRTEC ALLERGY) 10 MG tablet Take 1 tablet (10 mg total) by mouth every morning.    Marland Kitchen CRESTOR 20 MG tablet TAKE 1/2 TAB BY MOUTH EVERY MORNING 30 tablet 3  . cyanocobalamin ([facility] VITAMIN B12) 2000 MCG tablet Take 1 tablet (2,000 mcg total) by mouth daily.    . DULERA 100-5 MCG/ACT AERO INHALE 2 PUFFS INTO THE LUNGS TWICE A DAY 13 g 2  . furosemide (LASIX) 40 MG tablet TAKE 1 TABLET BY MOUTH DAILY 30  tablet 5  . KLOR-CON M20 20 MEQ tablet TAKE 1 TABLET BY MOUTH DAILY 90 tablet 3  . levalbuterol (XOPENEX HFA) 45 MCG/ACT inhaler Inhale 2 puffs into the lungs every 4 (four) hours as needed for wheezing. 15 g 2  . losartan (COZAAR) 100 MG tablet TAKE 1 TABLET BY MOUTH EVERY DAY 30 tablet 0  . pantoprazole (PROTONIX) 40 MG tablet TAKE 1 TABLET BY MOUTH TWICE A DAY 180 tablet 1  . dextromethorphan-guaiFENesin (MUCINEX DM) 30-600 MG per 12 hr tablet Take 1 tablet by mouth every 12 (twelve) hours as needed. cough     . SPIRIVA HANDIHALER 18 MCG inhalation capsule INHALE CONTENTS OF 1 CAPSULE DAILY 30 capsule 0  . triamcinolone (KENALOG) 0.025 % ointment APPLY 1 A SMALL AMOUNT TO AFFECTED AREA TWICE A DAY 80 g 2   No current facility-administered medications for this visit.    Allergies-reviewed and updated Allergies  Allergen Reactions  . Ticlopidine Hcl     REACTION: swelling    History   Social History  . Marital Status: Widowed    Spouse Name: N/A    Number of Children: N/A  . Years of Education: N/A   Occupational History  . retired from [employer] and [employer] Topics  . Smoking status: Former Smoker -- 3.00 packs/day for 50 years    Types: Cigarettes    Quit date: 09/25/2009  . Smokeless tobacco: Never Used  . Alcohol Use: 3.0 oz/week    6 drink(s) per week     Comment: [place] 2-3 a night  . Drug Use: No  . Sexual Activity: None   Other Topics Concern  . None   Social History Narrative   Widower in 2009. 3 kids. 4 grandkids. No greatgrandkids. No pets.    Lives with [name], brother in law.    Drives [name] to the store who shops for groceries. Prepares all of own food together.    [employer] independent. Son [name] helps with finances.       Advance-does not want resuscitation.    HCPOA-no      Retired from [employer] and [employer].       Hobbies: former Air cabin crew, used to fish, tv-old westerns, reading    ROS--See HPI   Objective: BP 102/70 mmHg   Pulse 64  Temp(Src) 98.1 F (36.7 C)  Wt 150 lb (68.04 kg) Gen: NAD, resting comfortably CV: irregularly irregular,  no murmurs rubs or gallops Lungs: CTAB no crackles, wheeze, rhonchi Abdomen: soft/nontender/nondistended/normal bowel sounds.  Ext: no edema Skin: warm, dry, several seborrheic keratosis noted on arms Neuro: grossly normal, moves all extremities   EKG: appears to be in atrial fibrillation or flutter (difficult to interpret baseline rhythm and also with PVCs at times). Rate 101. Normal axis. Cannot interpret pr interval  but other intervals normal. q wave v1 and v2.   Assessment/Plan:  COPD GOLD III Patient states he has transition care to our office for pulmonary. We will continue dulera and Spiriva. Unfortunately I do not believe other inhalers would be less expensive. I have asked him to contact pulmonology office to see if they have samples.  Atrial fibrillation Appears to be in atrial fibrillation today. Patient would to not like to be on blood thinners beyond aspirin. His Chads2 score is 1. I believe it is reasonable to 1 aspirin. I'll ask Dr. Burt Knack to look at his EKG and also see if he degrees with aspirin alone at this point.  Do not resuscitate We had a long discussion today and patient opts for DO NOT RESUSCITATE status. Yellow form signed for home use.  Essential hypertension Excellent control on losartan 100 mg and Lasix 80 mg.  Return precautions advised.   Orders Placed This Encounter  Procedures  . EKG 12-Lead

## 2014-02-07 NOTE — Assessment & Plan Note (Signed)
We had a long discussion today and patient opts for DO NOT RESUSCITATE status. Yellow form signed for home use.

## 2014-02-07 NOTE — Assessment & Plan Note (Signed)
Patient states he has transition care to our office for pulmonary. We will continue dulera and Spiriva. Unfortunately I do not believe other inhalers would be less expensive. I have asked him to contact pulmonology office to see if they have samples.

## 2014-02-07 NOTE — Assessment & Plan Note (Signed)
Excellent control on losartan 100 mg and Lasix 80 mg.

## 2014-02-07 NOTE — Assessment & Plan Note (Signed)
Appears to be in atrial fibrillation today. Patient would to not like to be on blood thinners beyond aspirin. His Chads2 score is 1. I believe it is reasonable to 1 aspirin. I'll ask Dr. Burt Knack to look at his EKG and also see if he degrees with aspirin alone at this point.

## 2014-02-18 ENCOUNTER — Ambulatory Visit (INDEPENDENT_AMBULATORY_CARE_PROVIDER_SITE_OTHER): Payer: Medicare Other | Admitting: Cardiovascular Disease

## 2014-02-18 ENCOUNTER — Encounter: Payer: Self-pay | Admitting: Cardiovascular Disease

## 2014-02-18 ENCOUNTER — Ambulatory Visit
Admission: RE | Admit: 2014-02-18 | Discharge: 2014-02-18 | Disposition: A | Discharge status: Home / Self Care | Payer: Medicare Other | Source: Ambulatory Visit | Attending: Cardiovascular Disease | Admitting: Cardiovascular Disease

## 2014-02-18 VITALS — BP 120/82 | HR 67 | Ht 66.0 in | Wt 147.1 lb

## 2014-02-18 DIAGNOSIS — I4891 Unspecified atrial fibrillation: Secondary | ICD-10-CM | POA: Diagnosis not present

## 2014-02-18 DIAGNOSIS — R634 Abnormal weight loss: Secondary | ICD-10-CM | POA: Diagnosis not present

## 2014-02-18 DIAGNOSIS — I2510 Atherosclerotic heart disease of native coronary artery without angina pectoris: Secondary | ICD-10-CM | POA: Diagnosis not present

## 2014-02-18 DIAGNOSIS — R05 Cough: Secondary | ICD-10-CM

## 2014-02-18 DIAGNOSIS — R059 Cough, unspecified: Secondary | ICD-10-CM

## 2014-02-18 DIAGNOSIS — Z951 Presence of aortocoronary bypass graft: Secondary | ICD-10-CM | POA: Diagnosis not present

## 2014-02-18 LAB — CBC
HCT: 48.2 % (ref 39.0–52.0)
Hemoglobin: 16.1 g/dL (ref 13.0–17.0)
MCHC: 33.4 g/dL (ref 30.0–36.0)
MCV: 109.2 fl — AB (ref 78.0–100.0)
Platelets: 177 10*3/uL (ref 150.0–400.0)
RBC: 4.41 Mil/uL (ref 4.22–5.81)
RDW: 13.6 % (ref 11.5–15.5)
WBC: 6.8 10*3/uL (ref 4.0–10.5)

## 2014-02-18 LAB — BASIC METABOLIC PANEL
BUN: 15 mg/dL (ref 6–23)
CHLORIDE: 96 meq/L (ref 96–112)
CO2: 30 meq/L (ref 19–32)
Calcium: 9.1 mg/dL (ref 8.4–10.5)
Creatinine, Ser: 1 mg/dL (ref 0.4–1.5)
GFR: 80.45 mL/min (ref >60.00)
GLUCOSE: 119 mg/dL — AB (ref 70–99)
POTASSIUM: 3.8 meq/L (ref 3.5–5.1)
SODIUM: 137 meq/L (ref 135–145)

## 2014-02-18 MED ORDER — RIVAROXABAN 20 MG PO TABS: 20.0000 mg | ORAL_TABLET | Freq: Every day | ORAL | Status: DC

## 2014-02-18 MED ORDER — DILTIAZEM HCL ER COATED BEADS 120 MG PO CP24: 120.0000 mg | ORAL_CAPSULE | Freq: Every day | ORAL | Status: DC

## 2014-02-18 NOTE — Progress Notes (Signed)
Background: The patient is followed for coronary artery disease with history of CABG in 2011. The patient also has PAD and has undergone iliac stenting. He remains most limited by exertional dyspnea related primarily to COPD. He is a long-time smoker, but now has quit.  HPI:  73 year old gentleman presenting for evaluation of atrial fibrillation. He recently saw his primary physician and was noted to have an irregular heart rhythm. EKG confirmed atrial fibrillation.  The patient is not feeling well. He complains of progressive weakness and shortness of breath over the last 3 weeks. He also has orthopnea. He's been sleeping in his recliner. He's had mild leg swelling, but denies abdominal swelling. He has elevated his legs and this has helped improve the swelling. He denies palpitations, but notes that his heart rate is almost always greater than on his monitor at home. He denies lightheadedness or syncope. He's had a cough, primarily when lying back in the supine position. He denies fevers, chills, or night sweats. He's been losing weight despite a normal appetite.   Outpatient Encounter Prescriptions as of 02/18/2014  Medication Sig  . aspirin 81 MG tablet Take 81 mg by mouth daily.    . Calcium Carbonate-Vit D-Min (CALTRATE 600+D PLUS) 600-400 MG-UNIT per tablet Chew 1 tablet by mouth daily.    . cetirizine (ZYRTEC ALLERGY) 10 MG tablet Take 1 tablet (10 mg total) by mouth every morning.  Marland Kitchen CRESTOR 20 MG tablet TAKE 1/2 TAB BY MOUTH EVERY MORNING  . cyanocobalamin ([employer]S VITAMIN B12) 2000 MCG tablet Take 1 tablet (2,000 mcg total) by mouth daily.  Marland Kitchen dextromethorphan-guaiFENesin (MUCINEX DM) 30-600 MG per 12 hr tablet Take 1 tablet by mouth every 12 (twelve) hours as needed. cough   . DULERA 100-5 MCG/ACT AERO INHALE 2 PUFFS INTO THE LUNGS TWICE A DAY  . furosemide (LASIX) 40 MG tablet TAKE 1 TABLET BY MOUTH DAILY  . KLOR-CON M20 20 MEQ tablet TAKE 1 TABLET BY MOUTH DAILY  . levalbuterol  (XOPENEX HFA) 45 MCG/ACT inhaler Inhale 2 puffs into the lungs every 4 (four) hours as needed for wheezing.  Marland Kitchen losartan (COZAAR) 100 MG tablet TAKE 1 TABLET BY MOUTH EVERY DAY  . pantoprazole (PROTONIX) 40 MG tablet TAKE 1 TABLET BY MOUTH TWICE A DAY  . SPIRIVA HANDIHALER 18 MCG inhalation capsule INHALE CONTENTS OF 1 CAPSULE DAILY  . triamcinolone (KENALOG) 0.025 % ointment APPLY 1 A SMALL AMOUNT TO AFFECTED AREA TWICE A DAY    Allergies  Allergen Reactions  . Ticlopidine Hcl     REACTION: swelling    Past Medical History  Diagnosis Date  . HYPERLIPIDEMIA 11/21/2006  . HYPERTENSION 11/21/2006  . MYOCARDIAL INFARCTION, HX OF 11/21/2006  . CORONARY ARTERY DISEASE 11/21/2006  . OSTEOPOROSIS 11/21/2006  . COLONIC POLYPS 05/21/2007  . COPD 05/21/2007  . HIATAL HERNIA 05/21/2007  . DIVERTICULAR DISEASE 05/21/2007  . NEPHROLITHIASIS 05/21/2007  . GI BLEEDING 09/09/2008  . ANEMIA, B12 DEFICIENCY 12/02/2008  . CAROTID ARTERY STENOSIS 04/16/2009  . PERIPHERAL VASCULAR DISEASE 09/02/2009  . Atrial fibrillation 11/06/2009  . Allergic rhinitis   . Osteoporosis   . Hemoptysis   . Internal hemorrhoids   . GERD (gastroesophageal reflux disease)   . Cataract   . Diabetes mellitus without complication     family history includes Arthritis in his mother; Colon cancer (age of onset: 20) in his brother; Heart attack in his father; Heart disease in his brother, brother, and father. There is no history of Stomach cancer.  ROS: Negative except as per HPI  BP 120/82 mmHg  Pulse 67  Ht 5\' 6"  (1.676 m)  Wt 147 lb 1.9 oz (66.733 kg)  BMI 23.76 kg/m2  PHYSICAL EXAM: Pt is alert and oriented, NAD HEENT: normal Neck: JVP - normal, carotids 2+= without bruits Lungs: CTA bilaterally, distant CV: Distant heart sounds, irregularly irregular with mild tachycardia Abd: soft, NT, Positive BS, no hepatomegaly Ext: no C/C/E, distal pulses intact and equal Skin: warm/dry no rash  EKG:  EKG reviewed from  02/06/2014 demonstrates atrial fibrillation heart rate 101 bpm, PVCs versus aberrancy.  ASSESSMENT AND PLAN: 1. Atrial fibrillation, recent onset 2. Acute on chronic congestive heart failure, likely diastolic, with New York Heart Association functional class III symptoms 3. Coronary artery disease, native vessel, without angina 4. Peripheral arterial disease 5. Weight loss  We discussed the implications of atrial fibrillation. We reviewed both the risk of cardioembolic events and the impact on cardiac function and congestive heart failure. His CHADS-Vasc = 4 (age 17, HTN 1, CAD 1, CHF 1). We reviewed the pros and cons of anticoagulation in general and also contrasted warfarin versus novel anticoagulant agents. He prefers a novel drug. He's been on warfarin in the past and has not tolerated it well. Plan as follows:  Start Xarelto 20 mg daily  Stop Aspirin  Increase furosemide to 80 mg daily x 3 days, then resume 40 mg daily  Check CBC, BMET today  Check 2D Echo  Add Cardizem CD 120 mg daily for rate-control  Arrange APP follow-up 2 weeks after echo  Check CXR to eval CHF, weight loss, cough, long smoking history  Sherren Mocha, MD 02/18/2014 8:19 AM

## 2014-02-18 NOTE — Patient Instructions (Signed)
Your physician has recommended you make the following change in your medication:  START Xarelto 20mg  take one by mouth daily STOP Aspirin INCREASE Furosemide to 80mg  daily for 3 days and then resume 40mg  daily START Cardizem CD 120mg  one by mouth daily  Your physician recommends that you have lab work today: [employer] and BMP  Your physician has requested that you have an echocardiogram. Echocardiography is a painless test that uses sound waves to create images of your heart. It provides your doctor with information about the size and shape of your heart and how well your heart's chambers and valves are working. This procedure takes approximately one hour. There are no restrictions for this procedure.  A chest x-ray takes a picture of the organs and structures inside the chest, including the heart, lungs, and blood vessels. This test can show several things, including, whether the heart is enlarges; whether fluid is building up in the lungs; and whether pacemaker / defibrillator leads are still in place. [facility] on [address])  Your physician recommends that you schedule a follow-up appointment in: 2 WEEKS with PA/NP

## 2014-02-19 ENCOUNTER — Encounter: Payer: Self-pay | Admitting: Cardiovascular Disease

## 2014-02-19 NOTE — Telephone Encounter (Signed)
F/u   Patient returning call. Please contact at [phone].

## 2014-02-19 NOTE — Telephone Encounter (Signed)
This encounter was created in error - please disregard.

## 2014-02-19 NOTE — Telephone Encounter (Signed)
New message      Talk to Dr Antionette Char nurse

## 2014-02-21 ENCOUNTER — Ambulatory Visit (HOSPITAL_COMMUNITY): Payer: Medicare Other | Attending: Internal Medicine | Admitting: Radiology

## 2014-02-21 DIAGNOSIS — Z87891 Personal history of nicotine dependence: Secondary | ICD-10-CM | POA: Diagnosis not present

## 2014-02-21 DIAGNOSIS — I4891 Unspecified atrial fibrillation: Secondary | ICD-10-CM | POA: Insufficient documentation

## 2014-02-21 DIAGNOSIS — E785 Hyperlipidemia, unspecified: Secondary | ICD-10-CM | POA: Insufficient documentation

## 2014-02-21 NOTE — Progress Notes (Signed)
Echocardiogram performed.  

## 2014-03-05 ENCOUNTER — Encounter (INDEPENDENT_AMBULATORY_CARE_PROVIDER_SITE_OTHER): Payer: Medicare Other

## 2014-03-05 ENCOUNTER — Ambulatory Visit (INDEPENDENT_AMBULATORY_CARE_PROVIDER_SITE_OTHER): Payer: Medicare Other | Admitting: Physician Assistant

## 2014-03-05 ENCOUNTER — Encounter: Payer: Self-pay | Admitting: *Deleted

## 2014-03-05 ENCOUNTER — Encounter: Payer: Self-pay | Admitting: Physician Assistant

## 2014-03-05 VITALS — BP 102/68 | HR 116 | Ht 66.0 in | Wt 151.4 lb

## 2014-03-05 DIAGNOSIS — I10 Essential (primary) hypertension: Secondary | ICD-10-CM

## 2014-03-05 DIAGNOSIS — I4891 Unspecified atrial fibrillation: Secondary | ICD-10-CM | POA: Diagnosis not present

## 2014-03-05 DIAGNOSIS — I481 Persistent atrial fibrillation: Secondary | ICD-10-CM

## 2014-03-05 DIAGNOSIS — I2510 Atherosclerotic heart disease of native coronary artery without angina pectoris: Secondary | ICD-10-CM | POA: Diagnosis not present

## 2014-03-05 DIAGNOSIS — I4819 Other persistent atrial fibrillation: Secondary | ICD-10-CM

## 2014-03-05 DIAGNOSIS — I25810 Atherosclerosis of coronary artery bypass graft(s) without angina pectoris: Secondary | ICD-10-CM | POA: Diagnosis not present

## 2014-03-05 MED ORDER — LOSARTAN POTASSIUM 50 MG PO TABS: 50.0000 mg | ORAL_TABLET | Freq: Every day | ORAL | Status: DC

## 2014-03-05 MED ORDER — DILTIAZEM HCL ER COATED BEADS 240 MG PO CP24: 240.0000 mg | ORAL_CAPSULE | Freq: Every day | ORAL | Status: DC

## 2014-03-05 MED ORDER — RIVAROXABAN 20 MG PO TABS: 20.0000 mg | ORAL_TABLET | Freq: Every day | ORAL | Status: DC

## 2014-03-05 MED ORDER — WARFARIN SODIUM 5 MG PO TABS: 5.0000 mg | ORAL_TABLET | Freq: Every day | ORAL | Status: DC

## 2014-03-05 NOTE — Progress Notes (Signed)
Patient ID: [name], male   DOB: Jul 27, 1940, 73 y.o.   MRN: [id number] [facility] 48 hour holter monitor applied to patient.

## 2014-03-05 NOTE — Assessment & Plan Note (Signed)
Stable without chest pain 

## 2014-03-05 NOTE — Assessment & Plan Note (Signed)
Patient's atrial fibrillation is not controlled. His blood pressure is also borderline low. We'll decrease losartan to 50 mg daily. Increase diltiazem to 240 mg daily. Will begin Coumadin 5 mg once daily. Continue Xarelto for a 3 day bridge and then stop. Appointment in the Coumadin clinic on Monday. Also a nurse visit on Monday to check heart rate and blood pressure. 48 hour monitor to make sure her heart rate is not dropping too low. Follow-up with me in 2 weeks and Dr. Burt Knack in one month.

## 2014-03-05 NOTE — Progress Notes (Signed)
HPI: This is a 73 year old male patient Dr. Burt Knack who he saw on 02/18/14 for recent onset atrial fibrillation and New York Heart Association functional class III symptoms of diastolic heart failure. His CHADS-Vasc score was 4 for hypertension, age, CAD, and CHF. He was started on Xarelto 20 mg daily, his Lasix was increased to 80 mg daily for 3 days then down to 40 and diltiazem 120 mg was added daily for rate control. 2-D echo was also done and showed mild LVH with normal LV function ejection fraction 55-60%. The left atrium was mildly dilated. Patient has history of CAD status post CABG in 2011. He also has PAD status post iliac stenting. He has COPD but quit smoking.  The patient comes in to day not feeling well. His heart rate is still elevated. He says it's still racing at times but his pulse ox at home says it drops to 30 at times. He has chronic dyspnea on exertion and dizziness associated with the atrial fibrillation. He says he cannot afford the Xarelto which will cost him $200 a month. He would like to go on Coumadin. He is accompanied by his son who had an ablation for atrial fibrillation and his granddaughter also had an ablation at [facility] for atrial fibrillation.  Allergies  Allergen Reactions  . Ticlopidine Hcl     REACTION: swelling     Current Outpatient Prescriptions  Medication Sig Dispense Refill  . Calcium Carbonate-Vit D-Min (CALTRATE 600+D PLUS) 600-400 MG-UNIT per tablet Chew 1 tablet by mouth daily.      . cetirizine (ZYRTEC ALLERGY) 10 MG tablet Take 1 tablet (10 mg total) by mouth every morning.    Marland Kitchen CRESTOR 20 MG tablet TAKE 1/2 TAB BY MOUTH EVERY MORNING 30 tablet 3  . cyanocobalamin ([employer]S VITAMIN B12) 2000 MCG tablet Take 1 tablet (2,000 mcg total) by mouth daily.    Marland Kitchen dextromethorphan-guaiFENesin (MUCINEX DM) 30-600 MG per 12 hr tablet Take 1 tablet by mouth every 12 (twelve) hours as needed. cough     . diltiazem (CARDIZEM CD) 120 MG 24 hr capsule Take 1  capsule (120 mg total) by mouth daily. 30 capsule 6  . DULERA 100-5 MCG/ACT AERO INHALE 2 PUFFS INTO THE LUNGS TWICE A DAY 13 g 2  . furosemide (LASIX) 40 MG tablet TAKE 1 TABLET BY MOUTH DAILY 30 tablet 5  . KLOR-CON M20 20 MEQ tablet TAKE 1 TABLET BY MOUTH DAILY 90 tablet 3  . levalbuterol (XOPENEX HFA) 45 MCG/ACT inhaler Inhale 2 puffs into the lungs every 4 (four) hours as needed for wheezing. 15 g 2  . losartan (COZAAR) 100 MG tablet TAKE 1 TABLET BY MOUTH EVERY DAY 30 tablet 0  . pantoprazole (PROTONIX) 40 MG tablet TAKE 1 TABLET BY MOUTH TWICE A DAY 180 tablet 1  . rivaroxaban (XARELTO) 20 MG TABS tablet Take 1 tablet (20 mg total) by mouth daily with supper. 30 tablet 6  . SPIRIVA HANDIHALER 18 MCG inhalation capsule INHALE CONTENTS OF 1 CAPSULE DAILY 30 capsule 0  . triamcinolone (KENALOG) 0.025 % ointment APPLY 1 A SMALL AMOUNT TO AFFECTED AREA TWICE A DAY 80 g 2   No current facility-administered medications for this visit.    Past Medical History  Diagnosis Date  . HYPERLIPIDEMIA 11/21/2006  . HYPERTENSION 11/21/2006  . MYOCARDIAL INFARCTION, HX OF 11/21/2006  . CORONARY ARTERY DISEASE 11/21/2006  . OSTEOPOROSIS 11/21/2006  . COLONIC POLYPS 05/21/2007  . COPD 05/21/2007  .  HIATAL HERNIA 05/21/2007  . DIVERTICULAR DISEASE 05/21/2007  . NEPHROLITHIASIS 05/21/2007  . GI BLEEDING 09/09/2008  . ANEMIA, B12 DEFICIENCY 12/02/2008  . CAROTID ARTERY STENOSIS 04/16/2009  . PERIPHERAL VASCULAR DISEASE 09/02/2009  . Atrial fibrillation 11/06/2009  . Allergic rhinitis   . Osteoporosis   . Hemoptysis   . Internal hemorrhoids   . GERD (gastroesophageal reflux disease)   . Cataract   . Diabetes mellitus without complication     Past Surgical History  Procedure Laterality Date  . Thyroidectomy      partial  . Coronary stent placement      03/07/2001  . Cholecystectomy    . Appendectomy    . Hemorrhoid surgery    . Inguinal hernia repair  10/10/08  . Coronary artery bypass graft  2011  .  Ptca      Family History  Problem Relation Age of Onset  . Arthritis Mother     rheumatoid  . Heart attack Father   . Heart disease Father   . Heart disease Brother   . Colon cancer Brother 61  . Stomach cancer Neg Hx   . Heart disease Brother     History   Social History  . Marital Status: Widowed    Spouse Name: N/A    Number of Children: N/A  . Years of Education: N/A   Occupational History  . retired from [employer] and [place] Topics  . Smoking status: Former Smoker -- 3.00 packs/day for 50 years    Types: Cigarettes    Quit date: 09/25/2009  . Smokeless tobacco: Never Used  . Alcohol Use: 3.0 oz/week    6 drink(s) per week     Comment: Bourbon 2-3 a night  . Drug Use: No  . Sexual Activity: Not on file   Other Topics Concern  . Not on file   Social History Narrative   Widower in 2009. 3 kids. 4 grandkids. No greatgrandkids. No pets.    Lives with Sharyl Nimrod, brother in law.    Drives [name] to the store who shops for groceries. Prepares all of own food together.    ADLS independent. Son Arye helps with finances.       Advance-does not want resuscitation.    HCPOA-no      Retired from [employer] and [employer].       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 Neck: No JVD, HJR, Bruit, or thyroid enlargement  Lungs: Decreased breath sounds throughout with scattered wheezes, no Rales.   Cardiovascular: Irregular irregular, PMI not displaced, Normal S1 and S2, no murmurs, gallops, bruit, thrill, or heave.  Abdomen: BS normal. Soft without organomegaly, masses, lesions or tenderness.  Extremities: without cyanosis, clubbing or edema. Good distal pulses bilateral  SKin: Warm, no lesions or rashes   Musculoskeletal: No  deformities  Neuro: no focal signs   Wt Readings from Last 3 Encounters:  03/05/14 151 lb 6.4 oz (68.675 kg)  02/18/14 147 lb 1.9 oz (66.733 kg)  02/06/14 150 lb (68.04 kg)    Lab Results  Component Value Date   WBC 6.8 02/18/2014   HGB 16.1 02/18/2014   HCT 48.2 02/18/2014   PLT 177.0 02/18/2014   GLUCOSE 119* 02/18/2014   CHOL  135 06/04/2012   TRIG 109.0 06/04/2012   HDL 38.80* 06/04/2012   LDLCALC 74 06/04/2012   ALT 15 06/04/2012   AST 17 06/04/2012   NA 137 02/18/2014   K 3.8 02/18/2014   CL 96 02/18/2014   CREATININE 1.0 02/18/2014   BUN 15 02/18/2014   CO2 30 02/18/2014   TSH 2.41 06/04/2012   PSA historical 03/28/2002   INR 1.48 10/22/2009   HGBA1C 6.6* 03/04/2013    EKG: Atrial fibrillation at 116 bpm with frequent PVCs 2-D echo 02/21/14: Study Conclusions  - Left ventricle: The cavity size was normal. There was mild   concentric hypertrophy. Systolic function was normal. The   estimated ejection fraction was in the range of 55% to 60%. Wall   motion was normal; there were no regional wall motion   abnormalities. - Mitral valve: Calcified annulus. Mildly thickened leaflets . - Left atrium: The atrium was mildly dilated.  Chest x-ray 02/18/14 IMPRESSION: 1. Prior CABG.  Heart size normal. 2. No acute cardiopulmonary disease. 3. Stable mid thoracic vertebral body compression fracture. Stable old bilateral rib fractures.

## 2014-03-05 NOTE — Assessment & Plan Note (Signed)
Blood pressure on the low side. Decrease losartan.

## 2014-03-05 NOTE — Patient Instructions (Signed)
Your physician has recommended you make the following change in your medication:   START TAKING LOSARTAN 50 MG  ONCE A DAY   START TAKING  CARDIZEM 240 MG ONCE A DAY   START TAKING  WARFARIN COUMADIN 5 MG ONCE DAY   TAKE XERALTO UNTIL 03/08/14 WITH COUMADIN  THEN STOP TAKING   Your physician recommends that you schedule a follow-up appointment in:  WITH [facility] IN 2 WEEKS   NURSE VISIT FOR Monday    FOR BLOOD PRESSURE AND HEART RATE WITH AN EKG   NEEDS  APPOINTMENT TO COUMADIN CLINIC Monday AS A NEW PATIENT   Your physician has recommended that you wear a holter monitor. Holter monitors are medical devices that record the heart's electrical activity. Doctors most often use these monitors to diagnose arrhythmias. Arrhythmias are problems with the speed or rhythm of the heartbeat. The monitor is a small, portable device. You can wear one while you do your normal daily activities. This is usually used to diagnose what is causing palpitations/syncope (passing out).

## 2014-03-10 ENCOUNTER — Encounter: Payer: Self-pay | Admitting: Physician Assistant

## 2014-03-10 ENCOUNTER — Ambulatory Visit (INDEPENDENT_AMBULATORY_CARE_PROVIDER_SITE_OTHER): Payer: Medicare Other | Admitting: Pharmacist

## 2014-03-10 ENCOUNTER — Ambulatory Visit (INDEPENDENT_AMBULATORY_CARE_PROVIDER_SITE_OTHER): Payer: Medicare Other | Admitting: Physician Assistant

## 2014-03-10 ENCOUNTER — Ambulatory Visit: Payer: Medicare Other | Admitting: Pharmacist

## 2014-03-10 VITALS — BP 91/62 | Ht 64.0 in | Wt 147.0 lb

## 2014-03-10 DIAGNOSIS — I2510 Atherosclerotic heart disease of native coronary artery without angina pectoris: Secondary | ICD-10-CM

## 2014-03-10 DIAGNOSIS — I481 Persistent atrial fibrillation: Secondary | ICD-10-CM | POA: Diagnosis not present

## 2014-03-10 DIAGNOSIS — Z720 Tobacco use: Secondary | ICD-10-CM | POA: Diagnosis not present

## 2014-03-10 DIAGNOSIS — I6529 Occlusion and stenosis of unspecified carotid artery: Secondary | ICD-10-CM | POA: Diagnosis not present

## 2014-03-10 DIAGNOSIS — I4819 Other persistent atrial fibrillation: Secondary | ICD-10-CM

## 2014-03-10 DIAGNOSIS — I25810 Atherosclerosis of coronary artery bypass graft(s) without angina pectoris: Secondary | ICD-10-CM

## 2014-03-10 DIAGNOSIS — F17200 Nicotine dependence, unspecified, uncomplicated: Secondary | ICD-10-CM

## 2014-03-10 DIAGNOSIS — I951 Orthostatic hypotension: Secondary | ICD-10-CM | POA: Insufficient documentation

## 2014-03-10 LAB — POCT INR: INR: 1.7

## 2014-03-10 MED ORDER — DILTIAZEM HCL ER COATED BEADS 120 MG PO CP24: 120.0000 mg | ORAL_CAPSULE | Freq: Every day | ORAL | Status: DC

## 2014-03-10 NOTE — Assessment & Plan Note (Signed)
No problem with chest pain.

## 2014-03-10 NOTE — Assessment & Plan Note (Signed)
Smokes an occasional cigarette when he is nervous. Smoking cessation discussed.

## 2014-03-10 NOTE — Assessment & Plan Note (Signed)
Patient is very orthostatic today. We'll stop his Lasix and potassium. Decrease Cardizem to 120 mg daily.

## 2014-03-10 NOTE — Progress Notes (Signed)
[name] is a 73 year old male patient Dr. Burt Knack who he saw on 02/18/14 for recent onset atrial fibrillation and New York Heart Association functional class III symptoms of diastolic heart failure. His CHADS-Vasc score was 4 for hypertension, age, CAD, and CHF. He was started on Xarelto 20 mg daily, his Lasix was increased to 80 mg daily for 3 days then down to 40 and diltiazem 120 mg was added daily for rate control. 2-D echo was also done and showed mild LVH with normal LV function ejection fraction 55-60%. The left atrium was mildly dilated. Patient has history of CAD status post CABG in 2011. He also has PAD status post iliac stenting. He has COPD but quit smoking.  I saw the patient last week and his heart rate was still racing. He was seen his pulse ox at home says it drops to the 30s at times. He has chronic dyspnea on exertion and dizziness associated with the atrial fibrillation. He could not afford the Xarelto and asked to be placed on Coumadin which we did. I increased his diltiazem to 240 mg daily in place a 48 hour monitor on him to make sure his heart rate was not dropping too low but he just returned it today.  Patient continues to feel poorly. He says his heart races up to 138 bpm and gets as low as 48 that he is aware of at home. His dizziness has gotten worse on the higher dose diltiazem. Patient is very orthostatic in the office today. Blood pressure lying is 105/72 standing 87/60. He has no appetite and has lost 20 pounds in the past month and a half.   Allergies  Allergen Reactions  . Ticlopidine Hcl     REACTION: swelling     Current Outpatient Prescriptions  Medication Sig Dispense Refill  . Calcium Carbonate-Vit D-Min (CALTRATE 600+D PLUS) 600-400 MG-UNIT per tablet Chew 1 tablet by mouth daily.      . cetirizine (ZYRTEC ALLERGY) 10 MG tablet Take 1 tablet (10 mg total) by mouth every morning.    Marland Kitchen CRESTOR 20 MG tablet TAKE 1/2 TAB BY MOUTH EVERY MORNING 30  tablet 3  . cyanocobalamin ([employer]S VITAMIN B12) 2000 MCG tablet Take 1 tablet (2,000 mcg total) by mouth daily.    Marland Kitchen dextromethorphan-guaiFENesin (MUCINEX DM) 30-600 MG per 12 hr tablet Take 1 tablet by mouth every 12 (twelve) hours as needed. cough     . diltiazem (CARDIZEM CD) 240 MG 24 hr capsule Take 1 capsule (240 mg total) by mouth daily. 30 capsule 6  . DULERA 100-5 MCG/ACT AERO INHALE 2 PUFFS INTO THE LUNGS TWICE A DAY 13 g 2  . furosemide (LASIX) 40 MG tablet TAKE 1 TABLET BY MOUTH DAILY 30 tablet 5  . KLOR-CON M20 20 MEQ tablet TAKE 1 TABLET BY MOUTH DAILY 90 tablet 3  . levalbuterol (XOPENEX HFA) 45 MCG/ACT inhaler Inhale 2 puffs into the lungs every 4 (four) hours as needed for wheezing. 15 g 2  . losartan (COZAAR) 50 MG tablet Take 1 tablet (50 mg total) by mouth daily. 30 tablet 6  . pantoprazole (PROTONIX) 40 MG tablet TAKE 1 TABLET BY MOUTH TWICE A DAY 180 tablet 1  . rivaroxaban (XARELTO) 20 MG TABS tablet Take 1 tablet (20 mg total) by mouth daily with supper. STOP TAKING XERALTO ON SAT 03/08/14 TO BE DISCONTINUED 30 tablet 6  . SPIRIVA HANDIHALER 18 MCG inhalation capsule INHALE CONTENTS OF 1 CAPSULE DAILY 30  capsule 0  . triamcinolone (KENALOG) 0.025 % ointment APPLY 1 A SMALL AMOUNT TO AFFECTED AREA TWICE A DAY 80 g 2  . warfarin (COUMADIN) 5 MG tablet Take 1 tablet (5 mg total) by mouth daily. 30 tablet 6   No current facility-administered medications for this visit.    Past Medical History  Diagnosis Date  . HYPERLIPIDEMIA 11/21/2006  . HYPERTENSION 11/21/2006  . MYOCARDIAL INFARCTION, HX OF 11/21/2006  . CORONARY ARTERY DISEASE 11/21/2006  . OSTEOPOROSIS 11/21/2006  . COLONIC POLYPS 05/21/2007  . COPD 05/21/2007  . HIATAL HERNIA 05/21/2007  . DIVERTICULAR DISEASE 05/21/2007  . NEPHROLITHIASIS 05/21/2007  . GI BLEEDING 09/09/2008  . ANEMIA, B12 DEFICIENCY 12/02/2008  . CAROTID ARTERY STENOSIS 04/16/2009  . PERIPHERAL VASCULAR DISEASE 09/02/2009  . Atrial fibrillation 11/06/2009    . Allergic rhinitis   . Osteoporosis   . Hemoptysis   . Internal hemorrhoids   . GERD (gastroesophageal reflux disease)   . Cataract   . Diabetes mellitus without complication     Past Surgical History  Procedure Laterality Date  . Thyroidectomy      partial  . Coronary stent placement      03/07/2001  . Cholecystectomy    . Appendectomy    . Hemorrhoid surgery    . Inguinal hernia repair  10/10/08  . Coronary artery bypass graft  2011  . Ptca      Family History  Problem Relation Age of Onset  . Arthritis Mother     rheumatoid  . Heart attack Father   . Heart disease Father   . Heart disease Brother   . Colon cancer Brother 23  . Stomach cancer Neg Hx   . Heart disease Brother     History   Social History  . Marital Status: Widowed    Spouse Name: N/A    Number of Children: N/A  . Years of Education: N/A   Occupational History  . retired from [employer] and [place] Topics  . Smoking status: Former Smoker -- 3.00 packs/day for 50 years    Types: Cigarettes    Quit date: 09/25/2009  . Smokeless tobacco: Never Used  . Alcohol Use: 3.0 oz/week    6 drink(s) per week     Comment: Bourbon 2-3 a night  . Drug Use: No  . Sexual Activity: Not on file   Other Topics Concern  . Not on file   Social History Narrative   Widower in 2009. 3 kids. 4 grandkids. No greatgrandkids. No pets.    Lives with Sharyl Nimrod, brother in law.    Drives [name] to the store who shops for groceries. Prepares all of own food together.    ADLS independent. Son Donald helps with finances.       Advance-does not want resuscitation.    HCPOA-no      Retired from [employer] and [employer].       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 Neck: No JVD, HJR, Bruit, or  thyroid enlargement  Lungs: Decreased breath sounds throughout No tachypnea, clear without wheezing, rales, or rhonchi  Cardiovascular: Irregular irregular, PMI not displaced, Normal S1 and S2, no murmurs, gallops, bruit, thrill, or heave.  Abdomen: BS normal. Soft  without organomegaly, masses, lesions or tenderness.  Extremities: without cyanosis, clubbing or edema. Good distal pulses bilateral  SKin: Warm, no lesions or rashes   Musculoskeletal: No deformities  Neuro: no focal signs   Wt Readings from Last 3 Encounters:  03/10/14 147 lb (66.679 kg)  03/05/14 151 lb 6.4 oz (68.675 kg)  02/18/14 147 lb 1.9 oz (66.733 kg)    Lab Results  Component Value Date   WBC 6.8 02/18/2014   HGB 16.1 02/18/2014   HCT 48.2 02/18/2014   PLT 177.0 02/18/2014   GLUCOSE 119* 02/18/2014   CHOL 135 06/04/2012   TRIG 109.0 06/04/2012   HDL 38.80* 06/04/2012   LDLCALC 74 06/04/2012   ALT 15 06/04/2012   AST 17 06/04/2012   NA 137 02/18/2014   K 3.8 02/18/2014   CL 96 02/18/2014   CREATININE 1.0 02/18/2014   BUN 15 02/18/2014   CO2 30 02/18/2014   TSH 2.41 06/04/2012   PSA historical 03/28/2002   INR 1.7 03/10/2014   HGBA1C 6.6* 03/04/2013    EKG: Atrial fibrillation at 92 bpm

## 2014-03-10 NOTE — Patient Instructions (Signed)
Your physician has recommended you make the following change in your medication:    STOP TAKING LASIX  STOP TAKING POTTASSIUM  START TAKING DILTIAZEM 120 MG ONCE A DAY    FOLLOW UP APPT WITH WITH  DR Burt Knack IN January OR WITH   AVAILABLE APP ON A DAY DR Burt Knack HERE   [place] ALSO WITH SALLY

## 2014-03-10 NOTE — Assessment & Plan Note (Signed)
Patient remains in atrial fibrillation. His rate is better controlled on the higher dose diltiazem but he is extremely orthostatic. He just turned the 48 hour monitor in today. We do not have the results of this.Patient was started on Xarelto 02/18/14 and was switched to Coumadin 03/05/14. I will discuss cardioversion with Dr. Burt Knack. He will be seen next week by either Dr. Burt Knack or an extender to arrange after his monitor is read.

## 2014-03-14 ENCOUNTER — Telehealth: Payer: Self-pay

## 2014-03-14 NOTE — Telephone Encounter (Signed)
Called patient about his monitor results. Per Dr. Irish Lack, short runs of NSVT, normal EF on 02/21/14, normal K+ on 02/18/14. Dr. Burt Knack to review on his return to office. Patient verbalized understanding.

## 2014-03-17 ENCOUNTER — Encounter: Payer: Self-pay | Admitting: Nurse Practitioner

## 2014-03-17 ENCOUNTER — Ambulatory Visit (INDEPENDENT_AMBULATORY_CARE_PROVIDER_SITE_OTHER): Payer: Medicare Other | Admitting: Nurse Practitioner

## 2014-03-17 ENCOUNTER — Ambulatory Visit (INDEPENDENT_AMBULATORY_CARE_PROVIDER_SITE_OTHER): Payer: Medicare Other | Admitting: Pharmacist

## 2014-03-17 VITALS — BP 124/80 | HR 91 | Ht 65.0 in | Wt 157.2 lb

## 2014-03-17 DIAGNOSIS — R0600 Dyspnea, unspecified: Secondary | ICD-10-CM

## 2014-03-17 DIAGNOSIS — I4819 Other persistent atrial fibrillation: Secondary | ICD-10-CM

## 2014-03-17 DIAGNOSIS — R062 Wheezing: Secondary | ICD-10-CM

## 2014-03-17 DIAGNOSIS — I5030 Unspecified diastolic (congestive) heart failure: Secondary | ICD-10-CM

## 2014-03-17 DIAGNOSIS — E785 Hyperlipidemia, unspecified: Secondary | ICD-10-CM | POA: Diagnosis not present

## 2014-03-17 DIAGNOSIS — I25810 Atherosclerosis of coronary artery bypass graft(s) without angina pectoris: Secondary | ICD-10-CM

## 2014-03-17 DIAGNOSIS — I10 Essential (primary) hypertension: Secondary | ICD-10-CM | POA: Diagnosis not present

## 2014-03-17 DIAGNOSIS — I481 Persistent atrial fibrillation: Secondary | ICD-10-CM

## 2014-03-17 DIAGNOSIS — I2510 Atherosclerotic heart disease of native coronary artery without angina pectoris: Secondary | ICD-10-CM

## 2014-03-17 DIAGNOSIS — J449 Chronic obstructive pulmonary disease, unspecified: Secondary | ICD-10-CM | POA: Diagnosis not present

## 2014-03-17 LAB — CBC
HCT: 44.5 % (ref 39.0–52.0)
Hemoglobin: 14.4 g/dL (ref 13.0–17.0)
MCHC: 32.3 g/dL (ref 30.0–36.0)
MCV: 109.1 fl — ABNORMAL HIGH (ref 78.0–100.0)
Platelets: 199 10*3/uL (ref 150.0–400.0)
RBC: 4.08 Mil/uL — ABNORMAL LOW (ref 4.22–5.81)
RDW: 13.6 % (ref 11.5–15.5)
WBC: 9.9 10*3/uL (ref 4.0–10.5)

## 2014-03-17 LAB — BASIC METABOLIC PANEL
BUN: 9 mg/dL (ref 6–23)
CO2: 30 mEq/L (ref 19–32)
Calcium: 8.6 mg/dL (ref 8.4–10.5)
Chloride: 98 mEq/L (ref 96–112)
Creatinine, Ser: 0.7 mg/dL (ref 0.4–1.5)
GFR: 111.65 mL/min (ref >60.00)
Glucose, Bld: 111 mg/dL — ABNORMAL HIGH (ref 70–99)
Potassium: 3.8 mEq/L (ref 3.5–5.1)
Sodium: 136 mEq/L (ref 135–145)

## 2014-03-17 LAB — POCT INR: INR: 4.1

## 2014-03-17 MED ORDER — FUROSEMIDE 40 MG PO TABS: 40.0000 mg | ORAL_TABLET | Freq: Every day | ORAL | Status: DC

## 2014-03-17 MED ORDER — POTASSIUM CHLORIDE CRYS ER 20 MEQ PO TBCR: 20.0000 meq | EXTENDED_RELEASE_TABLET | Freq: Every day | ORAL | Status: DC

## 2014-03-17 MED ORDER — AZITHROMYCIN 250 MG PO TABS: ORAL_TABLET | ORAL | Status: DC

## 2014-03-17 MED ORDER — DILTIAZEM HCL ER COATED BEADS 180 MG PO CP24: 180.0000 mg | ORAL_CAPSULE | Freq: Every day | ORAL | Status: DC

## 2014-03-17 NOTE — Patient Instructions (Addendum)
We will be checking the following labs today BMET, CBC, BNP, TSH  Stop the Losartan  Restart your Lasix 40 mg a day - this is at the drug store  Restart Kdur 20 meq daily -  This is at the drug store  Start Zithromax - follow directions on the bottle -  This is at the drug store  Increase the Diltiazem to 180 mg a day - this is at the drug store.  Coumadin check weekly  Referral to Dr. Caryl Comes  Needs follow up visit with Dr. Melvyn Novas   Call the [facility] office at [phone] if you have any questions, problems or concerns.

## 2014-03-17 NOTE — Progress Notes (Addendum)
Jodi Marble Date of Birth: June 25, 1940 Medical Record #[id number]  History of Present Illness: Mr. Loadholt is seen back today for a one week check. Seen for Dr. Burt Knack. He is a 73 year old male with PAf, diastolic HF, HTN, CAD with CABG in 2011, PAD, and COPD.   CHADSVASC of 4 (HTN, CAD, CHF and age).  Most recently found to have PAF. Has had echo with normal EF at 55 to 60%. Has had issues with rate control. On Xarelto.   Seen a week ago and was orthostatic. He was symptomatic and feeling poorly. Losing weight as well. Had to have his diltiazem cut back. 48 hour monitor placed - runs of NSVT noted with AF burder at 64%.  Comes back today. Here with his son, Beauregard Jarrells. He continues to feel poorly. He is short of breath. His weight is back up. No chest pain. Coughing - coughing up green sputum. Wheezing at times. Lightheaded and dizzy. Oxygen sat 89 to 92% at home. No syncope. +Palpitations. Frustrated. More swelling in his ankles now and weight is up. Not smoking. He says Xarelto made him "crazy" - was switched to Coumadin but not therapeutic.  Current Outpatient Prescriptions  Medication Sig Dispense Refill  . Calcium Carbonate-Vit D-Min (CALTRATE 600+D PLUS) 600-400 MG-UNIT per tablet Chew 1 tablet by mouth daily.      . cetirizine (ZYRTEC ALLERGY) 10 MG tablet Take 1 tablet (10 mg total) by mouth every morning.    Marland Kitchen CRESTOR 20 MG tablet TAKE 1/2 TAB BY MOUTH EVERY MORNING 30 tablet 3  . cyanocobalamin ([employer] VITAMIN B12) 2000 MCG tablet Take 1 tablet (2,000 mcg total) by mouth daily.    Marland Kitchen dextromethorphan-guaiFENesin (MUCINEX DM) 30-600 MG per 12 hr tablet Take 1 tablet by mouth every 12 (twelve) hours as needed. cough     . diltiazem (CARDIZEM CD) 120 MG 24 hr capsule Take 1 capsule (120 mg total) by mouth daily. 30 capsule 6  . DULERA 100-5 MCG/ACT AERO INHALE 2 PUFFS INTO THE LUNGS TWICE A DAY 13 g 2  . levalbuterol (XOPENEX HFA) 45 MCG/ACT inhaler Inhale 2 puffs into the lungs every 4 (four)  hours as needed for wheezing. 15 g 2  . losartan (COZAAR) 50 MG tablet Take 1 tablet (50 mg total) by mouth daily. 30 tablet 6  . pantoprazole (PROTONIX) 40 MG tablet TAKE 1 TABLET BY MOUTH TWICE A DAY 180 tablet 1  . SPIRIVA HANDIHALER 18 MCG inhalation capsule INHALE CONTENTS OF 1 CAPSULE DAILY 30 capsule 0  . triamcinolone (KENALOG) 0.025 % ointment APPLY 1 A SMALL AMOUNT TO AFFECTED AREA TWICE A DAY 80 g 2  . warfarin (COUMADIN) 5 MG tablet Take 1 tablet (5 mg total) by mouth daily. 30 tablet 6   No current facility-administered medications for this visit.    Allergies  Allergen Reactions  . Ticlopidine Hcl     REACTION: swelling    Past Medical History  Diagnosis Date  . HYPERLIPIDEMIA 11/21/2006  . HYPERTENSION 11/21/2006  . MYOCARDIAL INFARCTION, HX OF 11/21/2006  . CORONARY ARTERY DISEASE 11/21/2006  . OSTEOPOROSIS 11/21/2006  . COLONIC POLYPS 05/21/2007  . COPD 05/21/2007  . HIATAL HERNIA 05/21/2007  . DIVERTICULAR DISEASE 05/21/2007  . NEPHROLITHIASIS 05/21/2007  . GI BLEEDING 09/09/2008  . ANEMIA, B12 DEFICIENCY 12/02/2008  . CAROTID ARTERY STENOSIS 04/16/2009  . PERIPHERAL VASCULAR DISEASE 09/02/2009  . Atrial fibrillation 11/06/2009  . Allergic rhinitis   . Osteoporosis   . Hemoptysis   .  Internal hemorrhoids   . GERD (gastroesophageal reflux disease)   . Cataract   . Diabetes mellitus without complication     Past Surgical History  Procedure Laterality Date  . Thyroidectomy      partial  . Coronary stent placement      03/07/2001  . Cholecystectomy    . Appendectomy    . Hemorrhoid surgery    . Inguinal hernia repair  10/10/08  . Coronary artery bypass graft  2011  . Ptca      History  Smoking status  . Former Smoker -- 3.00 packs/day for 50 years  . Types: Cigarettes  . Quit date: 09/25/2009  Smokeless tobacco  . Never Used    History  Alcohol Use  . 3.0 oz/week  . 6 drink(s) per week    Comment: [place] 2-3 a night    Family History  Problem  Relation Age of Onset  . Arthritis Mother     rheumatoid  . Heart attack Father   . Heart disease Father   . Heart disease Brother   . Colon cancer Brother 25  . Stomach cancer Neg Hx   . Heart disease Brother     Review of Systems: The review of systems is per the HPI.  All other systems were reviewed and are negative.  Physical Exam: BP 124/80 mmHg  Pulse 91  Ht 5\' 5"  (1.651 m)  Wt 157 lb 3.2 oz (71.305 kg)  BMI 26.16 kg/m2 Patient is very pleasant and in no acute distress.Looks chronically ill. Skin is warm and dry. Color is normal.  HEENT is unremarkable. Normocephalic/atraumatic. PERRL. Sclera are nonicteric. Neck is supple. No masses. No JVD. Lungs show decreased breath sounds. Cardiac exam shows an irregular rhythm. Rate controlled. Abdomen is soft. Extremities are with 1+ edema. Gait and ROM are intact. No gross neurologic deficits noted.  Wt Readings from Last 3 Encounters:  03/17/14 157 lb 3.2 oz (71.305 kg)  03/10/14 147 lb (66.679 kg)  03/05/14 151 lb 6.4 oz (68.675 kg)    LABORATORY DATA/PROCEDURES:  Lab Results  Component Value Date   WBC 6.8 02/18/2014   HGB 16.1 02/18/2014   HCT 48.2 02/18/2014   PLT 177.0 02/18/2014   GLUCOSE 119* 02/18/2014   CHOL 135 06/04/2012   TRIG 109.0 06/04/2012   HDL 38.80* 06/04/2012   LDLCALC 74 06/04/2012   ALT 15 06/04/2012   AST 17 06/04/2012   NA 137 02/18/2014   K 3.8 02/18/2014   CL 96 02/18/2014   CREATININE 1.0 02/18/2014   BUN 15 02/18/2014   CO2 30 02/18/2014   TSH 2.41 06/04/2012   PSA historical 03/28/2002   INR 1.7 03/10/2014   HGBA1C 6.6* 03/04/2013   Lab Results  Component Value Date   INR 1.7 03/10/2014   INR 1.48 10/22/2009   INR 2.37* 10/21/2009     BNP (last 3 results) No results for input(s): PROBNP in the last 8760 hours.  Echo Study Conclusions from November 2015  - Left ventricle: The cavity size was normal. There was mild concentric hypertrophy. Systolic function was normal.  The estimated ejection fraction was in the range of 55% to 60%. Wall motion was normal; there were no regional wall motion abnormalities. - Mitral valve: Calcified annulus. Mildly thickened leaflets . - Left atrium: The atrium was mildly dilated.  CHEST 2 VIEW  COMPARISON: 02/21/2012.  FINDINGS: Mediastinum and hilar structures are normal. Prior CABG. Heart size normal. No pleural effusion or pneumothorax. Stable compression mid  thoracic vertebral body. Stable old bilateral rib fractures. Diffuse osteopenia and degenerative change.  IMPRESSION: 1. Prior CABG. Heart size normal. 2. No acute cardiopulmonary disease. 3. Stable mid thoracic vertebral body compression fracture. Stable old bilateral rib fractures.   Electronically Signed  By: Marcello Moores Register  On: 02/18/2014 10:24  Assessment / Plan: 1. PAF - I suspect this is chronic - reviewed his monitor with Dr. Rayann Heman - no real NSR noted. Not therapeutic on his coumadin. Son has seen Dr. Caryl Comes. Suspect he will need AAD - possibly Tikosyn - will increase his CCB back up to just 180 mg a day. Restart his Lasix and potassium. Check Tsh today.  2. HTN -  BP by me is 102/60. Not orthostatic by me - standing BP 130/78 - will stop the Losartan. CCB increased.  3. CAD with prior CABG -  No active chest pain  4. COPD - sounds like he has an exacerbation - will give Zpak. Needs to get back to pulmonary - I do not think all this is from his heart.  5. Coumadin therapy - need to check weekly. Consider cardioversion once therapeutic x 4 weeks.   6. Diastolic HF - will add back his Lasix. Check labs today.   Patient is agreeable to this plan and will call if any problems develop in the interim.   Burtis Junes, RN, [facility] [address] [place], [place]  [address] [phone]   Addendum: Patient seen in coumadin clinic. Continues to complain of palpitations -  symptomatic. INR has been therapeutic x 4 weeks.  Has OV with Dr. Caryl Comes in early February.  Will go ahead and plan for cardioversion for symptom control and he is to keep his OV with Dr. Caryl Comes as well.  Lab Results  Component Value Date   INR 4.0 04/14/2014   INR 2.8 04/07/2014   INR 2.4 03/31/2014   Cardioversion arranged for 04/16/14 at 2PM. Triage has given the patient his instructions. Orders placed in EPIC.   Burtis Junes, RN, [facility] [address] [place], [place]  [address] [phone]

## 2014-03-18 LAB — BRAIN NATRIURETIC PEPTIDE: Pro B Natriuretic peptide (BNP): 142 pg/mL — ABNORMAL HIGH (ref 0.0–100.0)

## 2014-03-18 LAB — TSH: TSH: 1.5 u[IU]/mL (ref 0.35–4.50)

## 2014-03-19 ENCOUNTER — Encounter: Payer: Self-pay | Admitting: Internal Medicine

## 2014-03-19 ENCOUNTER — Other Ambulatory Visit: Payer: Medicare Other

## 2014-03-19 ENCOUNTER — Ambulatory Visit (INDEPENDENT_AMBULATORY_CARE_PROVIDER_SITE_OTHER)
Admission: RE | Admit: 2014-03-19 | Discharge: 2014-03-19 | Disposition: A | Discharge status: Home / Self Care | Payer: Medicare Other | Source: Ambulatory Visit | Attending: Internal Medicine | Admitting: Internal Medicine

## 2014-03-19 ENCOUNTER — Ambulatory Visit (INDEPENDENT_AMBULATORY_CARE_PROVIDER_SITE_OTHER): Payer: Medicare Other | Admitting: Internal Medicine

## 2014-03-19 VITALS — BP 104/62 | HR 78 | Temp 98.4°F | Ht 65.0 in | Wt 152.0 lb

## 2014-03-19 DIAGNOSIS — I2510 Atherosclerotic heart disease of native coronary artery without angina pectoris: Secondary | ICD-10-CM

## 2014-03-19 DIAGNOSIS — J449 Chronic obstructive pulmonary disease, unspecified: Secondary | ICD-10-CM

## 2014-03-19 DIAGNOSIS — R918 Other nonspecific abnormal finding of lung field: Secondary | ICD-10-CM

## 2014-03-19 MED ORDER — PREDNISONE 10 MG PO TABS: ORAL_TABLET | ORAL | Status: DC

## 2014-03-19 MED ORDER — AMOXICILLIN-POT CLAVULANATE 875-125 MG PO TABS: 1.0000 | ORAL_TABLET | Freq: Two times a day (BID) | ORAL | Status: DC

## 2014-03-19 NOTE — Progress Notes (Signed)
Subjective:    Patient ID: [name], male    DOB: 10/31/1940    MRN: [id number]  Brief patient profile:  33 yowm quit smoking July 2011 at CABG and did fine in terms of improved ex tol > walk easily on flat grade around farm with w/u c/w GOLD II COPD PFT's 01/28/10 FEV1 1.16 46% ratio 44 with 27% improvement p B2 easily confused with meds/ instructions and given calendar but not using     March 11, 2010-- med review. He has brought all his meds in today. He is taking symbicort only once daily (not two times a day ) and has not started on pepcid at bedtime. Last ov changed off ace /norvasc now on bystolic. Cough is better w/ no further hemoptysis but not totally gone. Xray last ov showed persistent patchy and nodular airspace disease in the left lung. I will call with chest xray results.  Follow med calenadar closley and bring to each visit.  add pepcid 20mg  at bedtime  Increase Symbicort 2 puffs two times a day    April 23, 2010 ov cough and sob no better and throat very sore, no better p rx with rx as uri with doxy. mild dysphagia, mod hoarseness rec .stop symbicort  Try xopenex in place of ventolin  Dulera 100 2 bid Work on inhaler technique  See calendar for specific medication instructions > lost it    05/31/2012 f/u ov/Konnar Ben cc improved but still can't do steps s sob and getting over a cold with increase cough in am mucoid, no longer on bid ppi  Has not max'd mdm or tried the xopenex for the cough or felt he needed it for breathing and overall happy with his functional status. rec When coughing>  Add pepcid ac 20 mg at bedtime and push mucinex dm up to 1200 mg every 12 hours> did not feel he needed it    09/03/2012 f/u ov/Santez Woodcox re copd Chief Complaint  Patient presents with  . Follow-up    Pt states DOE may be slightly better since the last visit. No new co's today.    using spiriva and dulera 200 and some xopenex in afternoon's if overdoes it in the yard. rec F/u prn     . 03/19/2014 f/u ov/Ayan Yankey re: recurrent afib /Then sob   Chief Complaint  Patient presents with  . Acute Visit    Pt c/o increased SOB with or without exertion for the past month. He also c/o- started 2 wks ago- prod with large amounts of green sputum. He started on zithromax on 03/17/14.  He is using xopenex HFA 5-6 times per day.    2 weeks prior to OV  Acute onset sob assoc with palp but this occurred in setting of over use of saba for sob - whereas had only been using once a day at last ov says gradually over sev months ramped up used of xopenex due to sob with and without exertion but not typically noct then onset abruptly of much worse x 2 weeks assoc with prod cough , no better yet on zamx with persistent purulent sputum esp in ams   No obvious daytime variabilty or  cp or chest tightness, subjective wheeze overt sinus or hb symptoms. No unusual exp hx    Sleeping ok without nocturnal   exacerbation  of respiratory  c/o's or need for noct saba. Also denies any obvious fluctuation of symptoms with weather or environmental changes or other aggravating or alleviating  factors except as outlined above   ROS  The following are not active complaints unless bolded sore throat, dysphagia, dental problems, itching, sneezing,  nasal congestion or excess/ purulent secretions, ear ache,   fever, chills, sweats, unintended wt loss, pleuritic or exertional cp, hemoptysis,  orthopnea pnd or leg swelling, presyncope, palpitations, heartburn, abdominal pain, anorexia, nausea, vomiting, diarrhea  or change in bowel or urinary habits, change in stools or urine, dysuria,hematuria,  rash, arthralgias, visual complaints, headache, numbness weakness or ataxia or problems with walking or coordination,  change in mood/affect or memory.            Past Medical History: Allergic rhinitis  Osteoporosis  1. Coronary artery disease status post multiple percutaneous coronary  inventions > CABG 09/2009  2.  Peripheral vascular status post previous stenting of the totally  occluded iliac and status post stenting of an external iliac in  January 2009 with recurrent symptoms of questionable claudication.  3. Hypertension.  4. Hyperlipidemia.  5. History of amaurosis fugax 1998.  6. Gastroesophageal reflux disease.  anemia, B12 deficiency  AFIB--post CABG  Atrial fibrillation  COPD  - PFT's 01/28/10 FEV1 1.16 46% ratio 44 with 27% improvement p B2, DLCO 76%  - HFA 50% p coaching February 25, 2010 > 75% April 23, 2010 > 75%   Hemoptysis onset 01/2010  - CT chest 02/19/10 : no PE, no ca/ nodular infiltrates > f/u 03/11/10 cxr no nodules  COMPLEX MED REGIMEN Meds reviewed with pt education and computerized  med calendar completed March 11, 2010  08/12/10      Objective:   Physical Exam  GEN: A/Ox3; pleasant , NAD, well nourished   Wt 02/21/2012  163 > 164 05/31/2012 > 09/03/2012  163 > 03/19/2014  152 Wt Readings from Last 3 Encounters:  11/11/11 164 lb (74.39 kg)  10/21/11 165 lb (74.844 kg)  08/24/11 165 lb (74.844 kg)     HEENT mild turbinate edema.  Oropharynx no thrush or excess pnd or cobblestoning.  No JVD or cervical adenopathy. Mild accessory muscle hypertrophy. Trachea midline, nl thryroid. Chest was hyperinflated by percussion with diminished breath sounds and moderate increased exp time with bilateral  trace end exp wheeze. Hoover sign positive at mid inspiration. Regular rate and rhythm without murmur gallop or rub or increase P2 or edema.  Abd: no hsm, nl excursion. Ext warm without cyanosis or clubbing.        CXR PA and Lateral:   03/19/2014 :  Cardiac shadow is stable. Postoperative changes are again seen. The lungs are well aerated bilaterally and demonstrate some mild interstitial changes. More marked increased density is noted in the mid and lower lung field projecting in the right middle lobe on the lateral projection. Changes of prior coronary artery stenting  are Seen.   Recent Labs Lab 03/17/14 1032  NA 136  K 3.8  CL 98  CO2 30  BUN 9  CREATININE 0.7  GLUCOSE 111*    Recent Labs Lab 03/17/14 1032  HGB 14.4  HCT 44.5  WBC 9.9  PLT 199.0     Lab Results  Component Value Date   TSH 1.50 03/17/2014     Lab Results  Component Value Date   PROBNP 142.0* 03/17/2014        Assessment & Plan:

## 2014-03-19 NOTE — Patient Instructions (Addendum)
Prednisone 10 mg take  4 each am x 2 days,   2 each am x 2 days,  1 each am x 2 days and stop   If mucus stays nasty > Augmentin 875 mg take one pill twice daily  X 10 days - take at breakfast and supper with large glass of water.  It would help reduce the usual side effects (diarrhea and yeast infections) if you ate cultured yogurt at lunch.   Whenever you have a respiratory flare > protonix Take 30- 60 min before your first and last meals of the day and add Pepcid 20 mg at bedtime  For cough > mucinex dm 1200 mg every 12 hours / and use your flutter valve   Please remember to go to the  x-ray department downstairs for your tests - we will call you with the results when they are available.    Only use your albuterol(xopenex)  as a rescue medication to be used if you can't catch your breath by resting or doing a relaxed purse lip breathing pattern.  - The less you use it, the better it will work when you need it. Goal to get it back to once sleep - Ok to use up to 2 puffs  every 4 hours if you must but call for immediate appointment if use goes up over your usual need - Don't leave home without it !!  (think of it like the spare tire for your car)   GERD (REFLUX)  is an extremely common cause of respiratory symptoms just like yours , many times with no obvious heartburn at all.    It can be treated with medication, but also with lifestyle changes including avoidance of late meals, excessive alcohol, smoking cessation, and avoid fatty foods, chocolate, peppermint, colas, red wine, and acidic juices such as orange juice.  NO MINT OR MENTHOL PRODUCTS SO NO COUGH DROPS  USE SUGARLESS CANDY INSTEAD (Jolley ranchers or Stover's or Life Savers) or even ice chips will also do - the key is to swallow to prevent all throat clearing. NO OIL BASED VITAMINS - use powdered substitutes.    If not better > See Tammy NP w/in 2 weeks with all your medications, even over the counter meds, separated in two  separate bags, the ones you take no matter what vs the ones you stop once you feel better and take only as needed when you feel you need them.   Tammy  will generate for you a new user friendly medication calendar that will put [place] all on the same page re: your medication use.     Without this process, it simply isn't possible to assure that we are providing  your outpatient care  with  the attention to detail we feel you deserve.   If we cannot assure that you're getting that kind of care,  then we cannot manage your problem effectively from this clinic.  Once you have seen Tammy and we are sure that we're all on the same page with your medication use she will arrange follow up with me.   Late add : needs cxr and automatic return to see Tammy in 2 weeks and go ahead and take the augmentin

## 2014-03-20 DIAGNOSIS — R918 Other nonspecific abnormal finding of lung field: Secondary | ICD-10-CM | POA: Insufficient documentation

## 2014-03-20 NOTE — Assessment & Plan Note (Signed)
Dulera 100-5 2 puffs BID, xopenex prn, spiriva - PFT's 01/28/10 FEV1 1.16 46% ratio 44 with 27% improvement p B2, DLCO 76%  - HFA 90% 05/31/2012  - Walked 3 laps 05/31/12 @ 185 ft each stopped due to  End of study, no desats - Misplaced med calendar 07/29/2010 and not using spiriva q am as instructed. - PFT's 02/17/12  FEV1  1.20 (49%) ratio 45 and no better p B2 with dlco 64 corrects to 73  DDX of  difficult airways management all start with A and  include Adherence, Ace Inhibitors, Acid Reflux, Active Sinus Disease, Alpha 1 Antitripsin deficiency, Anxiety masquerading as Airways dz,  ABPA,  allergy(esp in young), Aspiration (esp in elderly), Adverse effects of DPI,  Active smokers, plus two Bs  = Bronchiectasis and Beta blocker use..and one C= CHF  Adherence is always the initial "prime suspect" and is a multilayered concern that requires a "trust but verify" approach in every patient - starting with knowing how to use medications, especially inhalers, correctly, keeping up with refills and understanding the fundamental difference between maintenance and prns vs those medications only taken for a very short course and then stopped and not refilled.  - not understanding how / when to use hfa saba - The proper method of use, as well as anticipated side effects, of a metered-dose inhaler are discussed and demonstrated to the patient. Improved effectiveness after extensive coaching during this visit to a level of approximately  90% so continue symbicort / spirva with goal of one or twice daily hfa saba  ? Acid (or non-acid) GERD > always difficult to exclude as up to 75% of pts in some series report no assoc GI/ Heartburn symptoms> rec max (24h)  acid suppression and diet restrictions/ reviewed and instructions given in writing.   ? Active sinus dz/ > if not better from zmax >Augmentin 875 mg take one pill twice daily  X 10 days -  ? Allergy > Prednisone 10 mg take  4 each am x 2 days,   2 each am x 2 days,   1 each am x 2 days and stop   ? chf > despite atrial arrythmia issue, BNP still quite low and not having a lot of noct wheeze to suggest chf so no change rx needed   ;pt struggling with concept of med reconciliation and action plan > if not better  To keep things simple, I have asked the patient to first separate medicines that are perceived as maintenance, that is to be taken daily "no matter what", from those medicines that are taken on only on an as-needed basis and I have given the patient examples of both, and then return to see our NP to generate a  detailed  medication calendar which should be followed until the next physician sees the patient and updates it.   Marland Kitchen

## 2014-03-20 NOTE — Assessment & Plan Note (Signed)
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

## 2014-03-24 ENCOUNTER — Ambulatory Visit (INDEPENDENT_AMBULATORY_CARE_PROVIDER_SITE_OTHER): Payer: Medicare Other

## 2014-03-24 DIAGNOSIS — I481 Persistent atrial fibrillation: Secondary | ICD-10-CM

## 2014-03-24 DIAGNOSIS — I4891 Unspecified atrial fibrillation: Secondary | ICD-10-CM

## 2014-03-24 DIAGNOSIS — I4819 Other persistent atrial fibrillation: Secondary | ICD-10-CM

## 2014-03-24 LAB — POCT INR: INR: 2.7

## 2014-03-31 ENCOUNTER — Ambulatory Visit (INDEPENDENT_AMBULATORY_CARE_PROVIDER_SITE_OTHER): Payer: Medicare Other | Admitting: *Deleted

## 2014-03-31 DIAGNOSIS — I4891 Unspecified atrial fibrillation: Secondary | ICD-10-CM | POA: Diagnosis not present

## 2014-03-31 DIAGNOSIS — I4819 Other persistent atrial fibrillation: Secondary | ICD-10-CM

## 2014-03-31 DIAGNOSIS — I481 Persistent atrial fibrillation: Secondary | ICD-10-CM

## 2014-03-31 LAB — POCT INR: INR: 2.4

## 2014-04-03 ENCOUNTER — Ambulatory Visit (INDEPENDENT_AMBULATORY_CARE_PROVIDER_SITE_OTHER)
Admission: RE | Admit: 2014-04-03 | Discharge: 2014-04-03 | Disposition: A | Discharge status: Home / Self Care | Payer: Medicare Other | Source: Ambulatory Visit | Attending: Adult Health | Admitting: Adult Health

## 2014-04-03 ENCOUNTER — Encounter: Payer: Self-pay | Admitting: Adult Health

## 2014-04-03 ENCOUNTER — Ambulatory Visit (INDEPENDENT_AMBULATORY_CARE_PROVIDER_SITE_OTHER): Payer: Medicare Other | Admitting: Adult Health

## 2014-04-03 VITALS — BP 120/84 | HR 107 | Temp 97.8°F | Ht 64.0 in | Wt 149.0 lb

## 2014-04-03 DIAGNOSIS — J189 Pneumonia, unspecified organism: Secondary | ICD-10-CM

## 2014-04-03 DIAGNOSIS — J45909 Unspecified asthma, uncomplicated: Secondary | ICD-10-CM | POA: Diagnosis not present

## 2014-04-03 DIAGNOSIS — J40 Bronchitis, not specified as acute or chronic: Secondary | ICD-10-CM | POA: Diagnosis not present

## 2014-04-03 HISTORY — DX: Pneumonia, unspecified organism: J18.9

## 2014-04-03 MED ORDER — MOMETASONE FURO-FORMOTEROL FUM 100-5 MCG/ACT IN AERO: 2.0000 | INHALATION_SPRAY | Freq: Two times a day (BID) | RESPIRATORY_TRACT | Status: AC

## 2014-04-03 MED ORDER — TIOTROPIUM BROMIDE MONOHYDRATE 18 MCG IN CAPS: 18.0000 ug | ORAL_CAPSULE | Freq: Every day | RESPIRATORY_TRACT | Status: DC

## 2014-04-03 NOTE — Progress Notes (Signed)
Subjective:    Patient ID: [name], male    DOB: [birth year]    MRN: [id number]  Brief patient profile:  54 yowm quit smoking July 2011 at CABG and did fine in terms of improved ex tol > walk easily on flat grade around [place] with w/u c/w GOLD II COPD PFT's 01/28/10 FEV1 1.16 46% ratio 44 with 27% improvement p B2 easily confused with meds/ instructions and given calendar but not using     March 11, 2010-- med review. He has brought all his meds in today. He is taking symbicort only once daily (not two times a day ) and has not started on pepcid at bedtime. Last ov changed off ace /norvasc now on bystolic. Cough is better w/ no further hemoptysis but not totally gone. Xray last ov showed persistent patchy and nodular airspace disease in the left lung. I will call with chest xray results.  Follow med calenadar closley and bring to each visit.  add pepcid 20mg  at bedtime  Increase Symbicort 2 puffs two times a day    April 23, 2010 ov cough and sob no better and throat very sore, no better p rx with rx as uri with doxy. mild dysphagia, mod hoarseness rec .stop symbicort  Try xopenex in place of ventolin  Dulera 100 2 bid Work on inhaler technique  See calendar for specific medication instructions > lost it    05/31/2012 f/u ov/Wert cc improved but still can't do steps s sob and getting over a cold with increase cough in am mucoid, no longer on bid ppi  Has not max'd mdm or tried the xopenex for the cough or felt he needed it for breathing and overall happy with his functional status. rec When coughing>  Add pepcid ac 20 mg at bedtime and push mucinex dm up to 1200 mg every 12 hours> did not feel he needed it    09/03/2012 f/u ov/Wert re copd Chief Complaint  Patient presents with  . Follow-up    Pt states DOE may be slightly better since the last visit. No new co's today.    using spiriva and dulera 200 and some xopenex in afternoon's if overdoes it in the yard. rec F/u prn     . 03/19/2014 f/u ov/Wert re: recurrent afib /Then sob   Chief Complaint  Patient presents with  . Acute Visit    Pt c/o increased SOB with or without exertion for the past month. He also c/o- started 2 wks ago- prod with large amounts of green sputum. He started on zithromax on 03/17/14.  He is using xopenex HFA 5-6 times per day.    2 weeks prior to OV  Acute onset sob assoc with palp but this occurred in setting of over use of saba for sob - whereas had only been using once a day at last ov says gradually over sev months ramped up used of xopenex due to sob with and without exertion but not typically noct then onset abruptly of much worse x 2 weeks assoc with prod cough , no better yet on zamx with persistent purulent sputum esp in ams  >CXR RML infiltrate c/w PNA >augmentin x 10 d   04/03/2014 Follow up PNA /COPD  Pt returns for 2 week follow up .  Seen last ov with acute symptoms of cough and dyspnea.  CXR showed RML infiltrate c/w with PNA, tx w/ augmentin x 10d .  He returns today for follow up . He  is feeling  Reports does feel some better but still  Weak.  Has come congested cough on/off.  Denies f/c/s, n/v/d, hemoptysis. , Chest pain, orthopnea, PND, or increased leg swelling Says appetite is improved.  CXR today shows resolved infiltrate.       ROS  The following are not active complaints unless bolded sore throat, dysphagia, dental problems, itching, sneezing,  nasal congestion or excess/ purulent secretions, ear ache,   fever, chills, sweats, unintended wt loss, pleuritic or exertional cp, hemoptysis,  orthopnea pnd or leg swelling, presyncope, palpitations, heartburn, abdominal pain, anorexia, nausea, vomiting, diarrhea  or change in bowel or urinary habits, change in stools or urine, dysuria,hematuria,  rash, arthralgias, visual complaints, headache, numbness weakness or ataxia or problems with walking or coordination,  change in mood/affect or memory.            Past  Medical History: Allergic rhinitis  Osteoporosis  1. Coronary artery disease status post multiple percutaneous coronary  inventions > CABG 09/2009  2. Peripheral vascular status post previous stenting of the totally  occluded iliac and status post stenting of an external iliac in  January 2009 with recurrent symptoms of questionable claudication.  3. Hypertension.  4. Hyperlipidemia.  5. History of amaurosis fugax 1998.  6. Gastroesophageal reflux disease.  anemia, B12 deficiency  AFIB--post CABG  Atrial fibrillation  COPD  - PFT's 01/28/10 FEV1 1.16 46% ratio 44 with 27% improvement p B2, DLCO 76%  - HFA 50% p coaching February 25, 2010 > 75% April 23, 2010 > 75%   Hemoptysis onset 01/2010  - CT chest 02/19/10 : no PE, no ca/ nodular infiltrates > f/u 03/11/10 cxr no nodules  COMPLEX MED REGIMEN Meds reviewed with pt education and computerized  med calendar completed March 11, 2010  08/12/10      Objective:   Physical Exam  GEN: A/Ox3; pleasant , NAD, well nourished   Wt 02/21/2012  163 > 164 05/31/2012 > 09/03/2012  163 > 03/19/2014  152> 04/03/2014 >149 04/03/2014     HEENT mild turbinate edema.  Oropharynx no thrush or excess pnd or cobblestoning.  No JVD or cervical adenopathy. Mild accessory muscle hypertrophy. Trachea midline, nl thryroid. Chest was hyperinflated by percussion with diminished breath sounds and moderate increased exp time with bilateral  trace end exp wheeze. Hoover sign positive at mid inspiration. Regular rate and rhythm without murmur gallop or rub or increase P2 or edema.  Abd: no hsm, nl excursion. Ext warm without cyanosis or clubbing.        CXR PA and Lateral:   03/19/2014 :  Cardiac shadow is stable. Postoperative changes are again seen. The lungs are well aerated bilaterally and demonstrate some mild interstitial changes. More marked increased density is noted in the mid and lower lung field projecting in the right middle lobe on the lateral  projection. Changes of prior coronary artery stenting are Seen.   Recent Labs Lab 03/17/14 1032  NA 136  K 3.8  CL 98  CO2 30  BUN 9  CREATININE 0.7  GLUCOSE 111*    Recent Labs Lab 03/17/14 1032  HGB 14.4  HCT 44.5  WBC 9.9  PLT 199.0     Lab Results  Component Value Date   TSH 1.50 03/17/2014     Lab Results  Component Value Date   PROBNP 142.0* 03/17/2014      CXR 04/03/2014  No acute infiltrate or pulmonary edema. Mild hyperinflation. Central mild bronchitic changes. Stable compression  deformity mid thoracic spine.   Assessment & Plan:

## 2014-04-03 NOTE — Assessment & Plan Note (Signed)
RML CAP resolved on cxr with abx  Pt is clinically improving   Plan  Continue on current regimen  Mucinex DM Twice daily  As needed  Cough/congestion  Follow up Dr. Melvyn Novas  In 6-8 weeks  and As needed   Please contact office for sooner follow up if symptoms do not improve or worsen or seek emergency care

## 2014-04-03 NOTE — Patient Instructions (Addendum)
Continue on current regimen  Mucinex DM Twice daily  As needed  Cough/congestion  Follow up Dr. Melvyn Novas  In 6-8 weeks  and As needed   Please contact office for sooner follow up if symptoms do not improve or worsen or seek emergency care

## 2014-04-07 ENCOUNTER — Ambulatory Visit (INDEPENDENT_AMBULATORY_CARE_PROVIDER_SITE_OTHER): Payer: Medicare Other

## 2014-04-07 DIAGNOSIS — I481 Persistent atrial fibrillation: Secondary | ICD-10-CM

## 2014-04-07 DIAGNOSIS — I4891 Unspecified atrial fibrillation: Secondary | ICD-10-CM

## 2014-04-07 DIAGNOSIS — I4819 Other persistent atrial fibrillation: Secondary | ICD-10-CM

## 2014-04-07 LAB — POCT INR: INR: 2.8

## 2014-04-12 ENCOUNTER — Other Ambulatory Visit: Payer: Self-pay | Admitting: Internal Medicine

## 2014-04-14 ENCOUNTER — Other Ambulatory Visit: Payer: Self-pay

## 2014-04-14 ENCOUNTER — Ambulatory Visit (INDEPENDENT_AMBULATORY_CARE_PROVIDER_SITE_OTHER): Payer: Medicare Other

## 2014-04-14 ENCOUNTER — Other Ambulatory Visit: Payer: Self-pay | Admitting: Nurse Practitioner

## 2014-04-14 ENCOUNTER — Telehealth: Payer: Self-pay | Admitting: Cardiovascular Disease

## 2014-04-14 DIAGNOSIS — I4891 Unspecified atrial fibrillation: Secondary | ICD-10-CM

## 2014-04-14 DIAGNOSIS — Z7901 Long term (current) use of anticoagulants: Secondary | ICD-10-CM

## 2014-04-14 DIAGNOSIS — I481 Persistent atrial fibrillation: Secondary | ICD-10-CM | POA: Diagnosis not present

## 2014-04-14 DIAGNOSIS — Z01812 Encounter for preprocedural laboratory examination: Secondary | ICD-10-CM

## 2014-04-14 DIAGNOSIS — I4819 Other persistent atrial fibrillation: Secondary | ICD-10-CM

## 2014-04-14 LAB — CBC WITH DIFFERENTIAL/PLATELET
Basophils Absolute: 0 10*3/uL (ref 0.0–0.1)
Basophils Relative: 0.2 % (ref 0.0–3.0)
Eosinophils Absolute: 0.1 10*3/uL (ref 0.0–0.7)
Eosinophils Relative: 1.1 % (ref 0.0–5.0)
HCT: 51.1 % (ref 39.0–52.0)
Hemoglobin: 16.8 g/dL (ref 13.0–17.0)
Lymphocytes Relative: 19.5 % (ref 12.0–46.0)
Lymphs Abs: 1.7 10*3/uL (ref 0.7–4.0)
MCHC: 33 g/dL (ref 30.0–36.0)
MCV: 104.9 fl — AB (ref 78.0–100.0)
Monocytes Absolute: 0.7 10*3/uL (ref 0.1–1.0)
Monocytes Relative: 7.7 % (ref 3.0–12.0)
NEUTROS PCT: 71.5 % (ref 43.0–77.0)
Neutro Abs: 6.3 10*3/uL (ref 1.4–7.7)
PLATELETS: 182 10*3/uL (ref 150.0–400.0)
RBC: 4.87 Mil/uL (ref 4.22–5.81)
RDW: 13.9 % (ref 11.5–15.5)
WBC: 8.9 10*3/uL (ref 4.0–10.5)

## 2014-04-14 LAB — BASIC METABOLIC PANEL
BUN: 12 mg/dL (ref 6–23)
CHLORIDE: 99 meq/L (ref 96–112)
CO2: 33 mEq/L — ABNORMAL HIGH (ref 19–32)
Calcium: 9.4 mg/dL (ref 8.4–10.5)
Creatinine, Ser: 0.9 mg/dL (ref 0.40–1.50)
GFR: 87.67 mL/min (ref >60.00)
Glucose, Bld: 129 mg/dL — ABNORMAL HIGH (ref 70–99)
Potassium: 4.3 mEq/L (ref 3.5–5.1)
Sodium: 139 mEq/L (ref 135–145)

## 2014-04-14 LAB — PROTIME-INR
INR: 4.1 ratio — ABNORMAL HIGH (ref 0.8–1.0)
Prothrombin Time: 43.8 s — ABNORMAL HIGH (ref 9.6–13.1)

## 2014-04-14 LAB — POCT INR: INR: 4

## 2014-04-14 NOTE — Progress Notes (Signed)
Agree with arranging cardioversion. Needs to keep OV with Dr. Caryl Comes for further EP management.

## 2014-04-14 NOTE — Telephone Encounter (Signed)
New Prob    Pt has some questions regarding upcoming cardioversion. Please call.

## 2014-04-14 NOTE — Telephone Encounter (Signed)
**Note De-Identified  Obfuscation** The pt and his son are both advised that the pt is scheduled for his Cardioversion on 04/16/14 with Dr Johnsie Cancel at 2 pm and that he needs to arrive at the [facility] main entrance at 12:30. The pts son, Makena Mcgrady, repeated all instructions back to me correctly and verbalized understanding. They are both advised to call the office back if they have any questions or concerns.

## 2014-04-15 ENCOUNTER — Encounter (HOSPITAL_COMMUNITY): Payer: Self-pay | Admitting: Pharmacy Technician

## 2014-04-15 DIAGNOSIS — I2510 Atherosclerotic heart disease of native coronary artery without angina pectoris: Secondary | ICD-10-CM | POA: Diagnosis not present

## 2014-04-15 DIAGNOSIS — I4891 Unspecified atrial fibrillation: Secondary | ICD-10-CM | POA: Diagnosis not present

## 2014-04-15 DIAGNOSIS — J449 Chronic obstructive pulmonary disease, unspecified: Secondary | ICD-10-CM | POA: Diagnosis not present

## 2014-04-15 DIAGNOSIS — I739 Peripheral vascular disease, unspecified: Secondary | ICD-10-CM | POA: Diagnosis not present

## 2014-04-15 MED ORDER — SODIUM CHLORIDE 0.9 % IV SOLN
INTRAVENOUS | Status: DC
  Administered 2014-04-16: 500 mL via INTRAVENOUS

## 2014-04-16 ENCOUNTER — Ambulatory Visit (HOSPITAL_COMMUNITY): Payer: Medicare Other | Admitting: Certified Registered Nurse Anesthetist

## 2014-04-16 ENCOUNTER — Ambulatory Visit (HOSPITAL_COMMUNITY)
Admission: RE | Admit: 2014-04-16 | Discharge: 2014-04-16 | Disposition: A | Discharge status: Home / Self Care | Payer: Medicare Other | Source: Ambulatory Visit | Attending: Cardiovascular Disease | Admitting: Cardiovascular Disease

## 2014-04-16 ENCOUNTER — Encounter (HOSPITAL_COMMUNITY)
Admission: RE | Disposition: A | Discharge status: Home / Self Care | Payer: Self-pay | Source: Ambulatory Visit | Attending: Cardiovascular Disease

## 2014-04-16 ENCOUNTER — Encounter (HOSPITAL_COMMUNITY): Payer: Self-pay

## 2014-04-16 DIAGNOSIS — Z8249 Family history of ischemic heart disease and other diseases of the circulatory system: Secondary | ICD-10-CM | POA: Diagnosis not present

## 2014-04-16 DIAGNOSIS — J449 Chronic obstructive pulmonary disease, unspecified: Secondary | ICD-10-CM | POA: Diagnosis not present

## 2014-04-16 DIAGNOSIS — I6529 Occlusion and stenosis of unspecified carotid artery: Secondary | ICD-10-CM | POA: Insufficient documentation

## 2014-04-16 DIAGNOSIS — M810 Age-related osteoporosis without current pathological fracture: Secondary | ICD-10-CM | POA: Insufficient documentation

## 2014-04-16 DIAGNOSIS — I10 Essential (primary) hypertension: Secondary | ICD-10-CM | POA: Insufficient documentation

## 2014-04-16 DIAGNOSIS — Z888 Allergy status to other drugs, medicaments and biological substances status: Secondary | ICD-10-CM | POA: Insufficient documentation

## 2014-04-16 DIAGNOSIS — E119 Type 2 diabetes mellitus without complications: Secondary | ICD-10-CM | POA: Insufficient documentation

## 2014-04-16 DIAGNOSIS — I252 Old myocardial infarction: Secondary | ICD-10-CM | POA: Diagnosis not present

## 2014-04-16 DIAGNOSIS — Z87891 Personal history of nicotine dependence: Secondary | ICD-10-CM | POA: Insufficient documentation

## 2014-04-16 DIAGNOSIS — K219 Gastro-esophageal reflux disease without esophagitis: Secondary | ICD-10-CM | POA: Insufficient documentation

## 2014-04-16 DIAGNOSIS — I4891 Unspecified atrial fibrillation: Secondary | ICD-10-CM | POA: Insufficient documentation

## 2014-04-16 DIAGNOSIS — I739 Peripheral vascular disease, unspecified: Secondary | ICD-10-CM | POA: Insufficient documentation

## 2014-04-16 DIAGNOSIS — I2510 Atherosclerotic heart disease of native coronary artery without angina pectoris: Secondary | ICD-10-CM | POA: Insufficient documentation

## 2014-04-16 DIAGNOSIS — Z7901 Long term (current) use of anticoagulants: Secondary | ICD-10-CM | POA: Diagnosis not present

## 2014-04-16 DIAGNOSIS — Z951 Presence of aortocoronary bypass graft: Secondary | ICD-10-CM | POA: Diagnosis not present

## 2014-04-16 DIAGNOSIS — Z79899 Other long term (current) drug therapy: Secondary | ICD-10-CM | POA: Insufficient documentation

## 2014-04-16 DIAGNOSIS — I4819 Other persistent atrial fibrillation: Secondary | ICD-10-CM

## 2014-04-16 HISTORY — PX: CARDIOVERSION: SHX1299

## 2014-04-16 SURGERY — CARDIOVERSION
Anesthesia: Monitor Anesthesia Care

## 2014-04-16 MED ORDER — PROPOFOL 10 MG/ML IV BOLUS
INTRAVENOUS | Status: DC | PRN
  Administered 2014-04-16: 100 mg via INTRAVENOUS

## 2014-04-16 MED ORDER — SODIUM CHLORIDE 0.9 % IV SOLN
INTRAVENOUS | Status: DC | PRN
  Administered 2014-04-16: 14:00:00 via INTRAVENOUS

## 2014-04-16 MED ORDER — LIDOCAINE HCL (CARDIAC) 20 MG/ML IV SOLN
INTRAVENOUS | Status: DC | PRN
  Administered 2014-04-16: 100 mg via INTRAVENOUS

## 2014-04-16 MED ORDER — METOPROLOL TARTRATE 1 MG/ML IV SOLN
INTRAVENOUS | Status: DC | PRN
  Administered 2014-04-16: 5 mg via INTRAVENOUS

## 2014-04-16 MED ORDER — METOPROLOL TARTRATE 1 MG/ML IV SOLN
INTRAVENOUS | Status: AC
  Filled 2014-04-16: qty 5

## 2014-04-16 NOTE — Transfer of Care (Signed)
Immediate Anesthesia Transfer of Care Note  Patient: [name]  Procedure(s) Performed: Procedure(s): CARDIOVERSION (N/A)  Patient Location: Endoscopy Unit  Anesthesia Type:MAC  Level of Consciousness: awake, alert  and oriented  Airway & Oxygen Therapy: Patient Spontanous Breathing and Patient connected to nasal cannula oxygen  Post-op Assessment: Report given to PACU RN, Post -op Vital signs reviewed and stable and Patient moving all extremities  Post vital signs: Reviewed and stable  Complications: No apparent anesthesia complications

## 2014-04-16 NOTE — CV Procedure (Signed)
DCC:  Anesthesia 100mg  diprovan and 100mg  lidocaine On Rx coumadin over 4 weeks  Baseline afib rate 98 DCC x 1 120 j synch biphasic  Converted to sinus tachycardia rate 108 with PVC;s  Gave 5 mg iv lopresser to confirm SR and not flutter  NSR rate 98  ECG pending  No immediate neurologic sequelae  Jenkins Rouge

## 2014-04-16 NOTE — H&P (Signed)
BSJ:GGEZ is a 74 year old male patient Dr. Burt Knack who he saw on 02/18/14 for recent onset atrial fibrillation and New York Heart Association functional class III symptoms of diastolic heart failure. His CHADS-Vasc score was 4 for hypertension, age, CAD, and CHF. He was started on Xarelto 20 mg daily, his Lasix was increased to 80 mg daily for 3 days then down to 40 and diltiazem 120 mg was added daily for rate control. 2-D echo was also done and showed mild LVH with normal LV function ejection fraction 55-60%. The left atrium was mildly dilated. Patient has history of CAD status post CABG in 2011. He also has PAD status post iliac stenting. He has COPD but quit smoking.  I saw the patient last week and his heart rate was still racing. He was seen his pulse ox at home says it drops to the 30s at times. He has chronic dyspnea on exertion and dizziness associated with the atrial fibrillation. He could not afford the Xarelto and asked to be placed on Coumadin which we did. I increased his diltiazem to 240 mg daily in place a 48 hour monitor on him to make sure his heart rate was not dropping too low but he just returned it today.  Patient continues to feel poorly. He says his heart races up to 138 bpm and gets as low as 48 that he is aware of at home. His dizziness has gotten worse on the higher dose diltiazem. Patient is very orthostatic in the office today. Blood pressure lying is 105/72 standing 87/60. He has no appetite and has lost 20 pounds in the past month and a half.   Allergies  Allergen Reactions  . Ticlopidine Hcl     REACTION: swelling     Current Outpatient Prescriptions  Medication Sig Dispense Refill  . Calcium Carbonate-Vit D-Min (CALTRATE 600+D PLUS) 600-400 MG-UNIT per tablet Chew 1 tablet by mouth daily.     . cetirizine (ZYRTEC ALLERGY) 10 MG tablet Take 1 tablet (10 mg total) by  mouth every morning.    Marland Kitchen CRESTOR 20 MG tablet TAKE 1/2 TAB BY MOUTH EVERY MORNING 30 tablet 3  . cyanocobalamin ([employer] VITAMIN B12) 2000 MCG tablet Take 1 tablet (2,000 mcg total) by mouth daily.    Marland Kitchen dextromethorphan-guaiFENesin (MUCINEX DM) 30-600 MG per 12 hr tablet Take 1 tablet by mouth every 12 (twelve) hours as needed. cough     . diltiazem (CARDIZEM CD) 240 MG 24 hr capsule Take 1 capsule (240 mg total) by mouth daily. 30 capsule 6  . DULERA 100-5 MCG/ACT AERO INHALE 2 PUFFS INTO THE LUNGS TWICE A DAY 13 g 2  . furosemide (LASIX) 40 MG tablet TAKE 1 TABLET BY MOUTH DAILY 30 tablet 5  . KLOR-CON M20 20 MEQ tablet TAKE 1 TABLET BY MOUTH DAILY 90 tablet 3  . levalbuterol (XOPENEX HFA) 45 MCG/ACT inhaler Inhale 2 puffs into the lungs every 4 (four) hours as needed for wheezing. 15 g 2  . losartan (COZAAR) 50 MG tablet Take 1 tablet (50 mg total) by mouth daily. 30 tablet 6  . pantoprazole (PROTONIX) 40 MG tablet TAKE 1 TABLET BY MOUTH TWICE A DAY 180 tablet 1  . rivaroxaban (XARELTO) 20 MG TABS tablet Take 1 tablet (20 mg total) by mouth daily with supper. STOP TAKING XERALTO ON SAT 03/08/14 TO BE DISCONTINUED 30 tablet 6  . SPIRIVA HANDIHALER 18 MCG inhalation capsule INHALE CONTENTS OF 1 CAPSULE DAILY 30 capsule  0  . triamcinolone (KENALOG) 0.025 % ointment APPLY 1 A SMALL AMOUNT TO AFFECTED AREA TWICE A DAY 80 g 2  . warfarin (COUMADIN) 5 MG tablet Take 1 tablet (5 mg total) by mouth daily. 30 tablet 6   No current facility-administered medications for this visit.    Past Medical History  Diagnosis Date  . HYPERLIPIDEMIA 11/21/2006  . HYPERTENSION 11/21/2006  . MYOCARDIAL INFARCTION, HX OF 11/21/2006  . CORONARY ARTERY DISEASE 11/21/2006  . OSTEOPOROSIS 11/21/2006  . COLONIC POLYPS 05/21/2007  . COPD 05/21/2007  . HIATAL HERNIA 05/21/2007  . DIVERTICULAR DISEASE 05/21/2007  .  NEPHROLITHIASIS 05/21/2007  . GI BLEEDING 09/09/2008  . ANEMIA, B12 DEFICIENCY 12/02/2008  . CAROTID ARTERY STENOSIS 04/16/2009  . PERIPHERAL VASCULAR DISEASE 09/02/2009  . Atrial fibrillation 11/06/2009  . Allergic rhinitis   . Osteoporosis   . Hemoptysis   . Internal hemorrhoids   . GERD (gastroesophageal reflux disease)   . Cataract   . Diabetes mellitus without complication     Past Surgical History  Procedure Laterality Date  . Thyroidectomy      partial  . Coronary stent placement      03/07/2001  . Cholecystectomy    . Appendectomy    . Hemorrhoid surgery    . Inguinal hernia repair  10/10/08  . Coronary artery bypass graft  2011  . Ptca      Family History  Problem Relation Age of Onset  . Arthritis Mother     rheumatoid  . Heart attack Father   . Heart disease Father   . Heart disease Brother   . Colon cancer Brother 52  . Stomach cancer Neg Hx   . Heart disease Brother     History   Social History  . Marital Status: Widowed    Spouse Name: N/A    Number of Children: N/A  . Years of Education: N/A   Occupational History  . retired from [employer] and [place] Topics  . Smoking status: Former Smoker -- 3.00 packs/day for 50 years    Types: Cigarettes    Quit date: 09/25/2009  . Smokeless tobacco: Never Used  . Alcohol Use: 3.0 oz/week    6 drink(s) per week     Comment: [place] 2-3 a night  . Drug Use: No  . Sexual Activity: Not on file   Other Topics Concern  . Not on file   Social History Narrative   Widower in 2009. 3 kids. 4 grandkids. No greatgrandkids. No pets.    Lives with Sharyl Nimrod, brother in law.    Drives [name] to the store who shops for groceries. Prepares all of own food together.    AD[employer] independent. Son Filippo helps with  finances.       Advance-does not want resuscitation.    HCPOA-no      Retired from [employer] and [employer].       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 Neck: No JVD, HJR, Bruit, or thyroid enlargement  Lungs: Decreased breath sounds throughout No tachypnea, clear without wheezing, rales, or rhonchi  Cardiovascular: Irregular irregular, PMI not displaced, Normal S1 and S2, no murmurs, gallops, bruit, thrill, or heave.  Abdomen: BS normal. Soft without organomegaly,  masses, lesions or tenderness.  Extremities: without cyanosis, clubbing or edema. Good distal pulses bilateral  SKin: Warm, no lesions or rashes   Musculoskeletal: No deformities  Neuro: no focal signs   Wt Readings from Last 3 Encounters:  03/10/14 147 lb (66.679 kg)  03/05/14 151 lb 6.4 oz (68.675 kg)  02/18/14 147 lb 1.9 oz (66.733 kg)     Recent Labs    Lab Results  Component Value Date   WBC 6.8 02/18/2014   HGB 16.1 02/18/2014   HCT 48.2 02/18/2014   PLT 177.0 02/18/2014   GLUCOSE 119* 02/18/2014   CHOL 135 06/04/2012   TRIG 109.0 06/04/2012   HDL 38.80* 06/04/2012   LDLCALC 74 06/04/2012   ALT 15 06/04/2012   AST 17 06/04/2012   NA 137 02/18/2014   K 3.8 02/18/2014   CL 96 02/18/2014   CREATININE 1.0 02/18/2014   BUN 15 02/18/2014   CO2 30 02/18/2014   TSH 2.41 06/04/2012   PSA historical 03/28/2002   INR 1.7 03/10/2014   HGBA1C 6.6* 03/04/2013      EKG: Atrial fibrillation at 92 bpm   Plan:  Seen by PA and Dr Burt Knack.  CAD/CABG  PVD iliac stenting.  Afib with chronic dyspnea from COPD.  Could not afford xarelto Coumadin Rx 4 weeks.  Plan [place]  EF normal by echo  Jenkins Rouge

## 2014-04-16 NOTE — Discharge Instructions (Addendum)
Continue coumadin F/U with Dr Burt Knack or NP Marcellina Millin F/U Wert for COPD and breathing i  Electrical Cardioversion, Care After Refer to this sheet in the next few weeks. These instructions provide you with information on caring for yourself after your procedure. Your health care provider may also give you more specific instructions. Your treatment has been planned according to current medical practices, but problems sometimes occur. Call your health care provider if you have any problems or questions after your procedure. WHAT TO EXPECT AFTER THE PROCEDURE After your procedure, it is typical to have the following sensations:  Some redness on the skin where the shocks were delivered. If this is tender, a sunburn lotion or hydrocortisone cream may help.  Possible return of an abnormal heart rhythm within hours or days after the procedure. HOME CARE INSTRUCTIONS  Take medicines only as directed by your health care provider. Be sure you understand how and when to take your medicine.  Learn how to feel your pulse and check it often.  Limit your activity for 48 hours after the procedure or as directed by your health care provider.  Avoid or minimize caffeine and other stimulants as directed by your health care provider. SEEK MEDICAL CARE IF:  You feel like your heart is beating too fast or your pulse is not regular.  You have any questions about your medicines.  You have bleeding that will not stop. SEEK IMMEDIATE MEDICAL CARE IF:  You are dizzy or feel faint.  It is hard to breathe or you feel short of breath.  There is a change in discomfort in your chest.  Your speech is slurred or you have trouble moving an arm or leg on one side of your body.  You get a serious muscle cramp that does not go away.  Your fingers or toes turn cold or blue. Document Released: 01/02/2013 Document Revised: 07/29/2013 Document Reviewed: 01/02/2013 [facility] Patient Information 2015 [place], [place].  This information is not intended to replace advice given to you by your health care provider. Make sure you discuss any questions you have with your health care provider. ssues

## 2014-04-16 NOTE — Anesthesia Preprocedure Evaluation (Addendum)
Anesthesia Evaluation  Patient identified by MRN, date of birth, ID band Patient awake    Reviewed: Allergy & Precautions, NPO status , Patient's Chart, lab work & pertinent test results  History of Anesthesia Complications Negative for: history of anesthetic complications  Airway Mallampati: II  TM Distance: >3 FB Neck ROM: Full    Dental  (+) Teeth Intact, Caps, Dental Advisory Given   Pulmonary pneumonia -, resolved, COPD COPD inhaler, Current Smoker, former smoker,          Cardiovascular hypertension, Pt. on medications + CAD and + Past MI     Neuro/Psych    GI/Hepatic GERD-  Medicated and Controlled,  Endo/Other  diabetes  Renal/GU Renal disease     Musculoskeletal   Abdominal   Peds  Hematology   Anesthesia Other Findings   Reproductive/Obstetrics                           Anesthesia Physical Anesthesia Plan  ASA: III  Anesthesia Plan: General   Post-op Pain Management:    Induction: Intravenous  Airway Management Planned: Mask  Additional Equipment:   Intra-op Plan:   Post-operative Plan:   Informed Consent: I have reviewed the patients History and Physical, chart, labs and discussed the procedure including the risks, benefits and alternatives for the proposed anesthesia with the patient or authorized representative who has indicated his/her understanding and acceptance.   Dental advisory given  Plan Discussed with: CRNA and Surgeon  Anesthesia Plan Comments:        Anesthesia Quick Evaluation

## 2014-04-16 NOTE — Anesthesia Postprocedure Evaluation (Signed)
  Anesthesia Post-op Note  Patient: [name]  Procedure(s) Performed: Procedure(s): CARDIOVERSION (N/A)  Patient Location: Endoscopy Unit  Anesthesia Type:MAC  Level of Consciousness: awake, alert  and oriented  Airway and Oxygen Therapy: Patient Spontanous Breathing and Patient connected to nasal cannula oxygen  Post-op Pain: none  Post-op Assessment: Post-op Vital signs reviewed, Patient's Cardiovascular Status Stable, Respiratory Function Stable and Patent Airway  Post-op Vital Signs: Reviewed and stable  Last Vitals:  Filed Vitals:   04/16/14 1231  BP: 187/110  Pulse: 109  Temp: 36.7 C  Resp: 25    Complications: No apparent anesthesia complications

## 2014-04-17 ENCOUNTER — Encounter (HOSPITAL_COMMUNITY): Payer: Self-pay | Admitting: Cardiovascular Disease

## 2014-04-22 ENCOUNTER — Ambulatory Visit (INDEPENDENT_AMBULATORY_CARE_PROVIDER_SITE_OTHER): Payer: Medicare Other | Admitting: *Deleted

## 2014-04-22 DIAGNOSIS — I481 Persistent atrial fibrillation: Secondary | ICD-10-CM | POA: Diagnosis not present

## 2014-04-22 DIAGNOSIS — I4891 Unspecified atrial fibrillation: Secondary | ICD-10-CM

## 2014-04-22 DIAGNOSIS — I4819 Other persistent atrial fibrillation: Secondary | ICD-10-CM

## 2014-04-22 LAB — POCT INR: INR: 4.3

## 2014-04-30 ENCOUNTER — Ambulatory Visit (INDEPENDENT_AMBULATORY_CARE_PROVIDER_SITE_OTHER): Payer: Medicare Other | Admitting: Pharmacist

## 2014-04-30 DIAGNOSIS — I481 Persistent atrial fibrillation: Secondary | ICD-10-CM

## 2014-04-30 DIAGNOSIS — I4819 Other persistent atrial fibrillation: Secondary | ICD-10-CM

## 2014-04-30 DIAGNOSIS — I4891 Unspecified atrial fibrillation: Secondary | ICD-10-CM | POA: Diagnosis not present

## 2014-04-30 LAB — POCT INR: INR: 4

## 2014-05-01 ENCOUNTER — Institutional Professional Consult (permissible substitution): Payer: Medicare Other | Admitting: Internal Medicine

## 2014-05-08 ENCOUNTER — Ambulatory Visit (INDEPENDENT_AMBULATORY_CARE_PROVIDER_SITE_OTHER): Payer: Medicare Other

## 2014-05-08 ENCOUNTER — Other Ambulatory Visit: Payer: Self-pay | Admitting: Family Medicine

## 2014-05-08 DIAGNOSIS — I481 Persistent atrial fibrillation: Secondary | ICD-10-CM

## 2014-05-08 DIAGNOSIS — I4891 Unspecified atrial fibrillation: Secondary | ICD-10-CM

## 2014-05-08 DIAGNOSIS — I4819 Other persistent atrial fibrillation: Secondary | ICD-10-CM

## 2014-05-08 LAB — POCT INR: INR: 2.2

## 2014-05-10 ENCOUNTER — Inpatient Hospital Stay (HOSPITAL_COMMUNITY)
Admission: EM | Admit: 2014-05-10 | Discharge: 2014-05-16 | DRG: 189 | Disposition: A | Discharge status: Skilled Nursing Facility | Payer: Medicare Other | Attending: Internal Medicine | Admitting: Internal Medicine

## 2014-05-10 ENCOUNTER — Emergency Department (HOSPITAL_COMMUNITY): Payer: Medicare Other

## 2014-05-10 ENCOUNTER — Encounter (HOSPITAL_COMMUNITY): Payer: Self-pay | Admitting: Emergency Medicine

## 2014-05-10 DIAGNOSIS — I480 Paroxysmal atrial fibrillation: Secondary | ICD-10-CM | POA: Diagnosis not present

## 2014-05-10 DIAGNOSIS — Z86010 Personal history of colonic polyps: Secondary | ICD-10-CM

## 2014-05-10 DIAGNOSIS — Z66 Do not resuscitate: Secondary | ICD-10-CM | POA: Diagnosis present

## 2014-05-10 DIAGNOSIS — J9602 Acute respiratory failure with hypercapnia: Principal | ICD-10-CM | POA: Diagnosis present

## 2014-05-10 DIAGNOSIS — Z955 Presence of coronary angioplasty implant and graft: Secondary | ICD-10-CM | POA: Diagnosis not present

## 2014-05-10 DIAGNOSIS — E785 Hyperlipidemia, unspecified: Secondary | ICD-10-CM | POA: Diagnosis not present

## 2014-05-10 DIAGNOSIS — I2510 Atherosclerotic heart disease of native coronary artery without angina pectoris: Secondary | ICD-10-CM

## 2014-05-10 DIAGNOSIS — Z79899 Other long term (current) drug therapy: Secondary | ICD-10-CM | POA: Diagnosis not present

## 2014-05-10 DIAGNOSIS — I5031 Acute diastolic (congestive) heart failure: Secondary | ICD-10-CM | POA: Diagnosis not present

## 2014-05-10 DIAGNOSIS — J449 Chronic obstructive pulmonary disease, unspecified: Secondary | ICD-10-CM

## 2014-05-10 DIAGNOSIS — E1165 Type 2 diabetes mellitus with hyperglycemia: Secondary | ICD-10-CM | POA: Diagnosis present

## 2014-05-10 DIAGNOSIS — Z951 Presence of aortocoronary bypass graft: Secondary | ICD-10-CM | POA: Diagnosis not present

## 2014-05-10 DIAGNOSIS — M810 Age-related osteoporosis without current pathological fracture: Secondary | ICD-10-CM | POA: Diagnosis present

## 2014-05-10 DIAGNOSIS — R0602 Shortness of breath: Secondary | ICD-10-CM

## 2014-05-10 DIAGNOSIS — I5033 Acute on chronic diastolic (congestive) heart failure: Secondary | ICD-10-CM | POA: Diagnosis not present

## 2014-05-10 DIAGNOSIS — Z888 Allergy status to other drugs, medicaments and biological substances status: Secondary | ICD-10-CM | POA: Diagnosis not present

## 2014-05-10 DIAGNOSIS — Z8249 Family history of ischemic heart disease and other diseases of the circulatory system: Secondary | ICD-10-CM

## 2014-05-10 DIAGNOSIS — Z87442 Personal history of urinary calculi: Secondary | ICD-10-CM | POA: Diagnosis not present

## 2014-05-10 DIAGNOSIS — I6529 Occlusion and stenosis of unspecified carotid artery: Secondary | ICD-10-CM | POA: Diagnosis present

## 2014-05-10 DIAGNOSIS — J80 Acute respiratory distress syndrome: Secondary | ICD-10-CM | POA: Diagnosis not present

## 2014-05-10 DIAGNOSIS — I10 Essential (primary) hypertension: Secondary | ICD-10-CM | POA: Diagnosis present

## 2014-05-10 DIAGNOSIS — I252 Old myocardial infarction: Secondary | ICD-10-CM

## 2014-05-10 DIAGNOSIS — J309 Allergic rhinitis, unspecified: Secondary | ICD-10-CM | POA: Diagnosis present

## 2014-05-10 DIAGNOSIS — K219 Gastro-esophageal reflux disease without esophagitis: Secondary | ICD-10-CM | POA: Diagnosis present

## 2014-05-10 DIAGNOSIS — R069 Unspecified abnormalities of breathing: Secondary | ICD-10-CM | POA: Diagnosis not present

## 2014-05-10 DIAGNOSIS — T380X5A Adverse effect of glucocorticoids and synthetic analogues, initial encounter: Secondary | ICD-10-CM | POA: Diagnosis present

## 2014-05-10 DIAGNOSIS — Z9049 Acquired absence of other specified parts of digestive tract: Secondary | ICD-10-CM | POA: Diagnosis present

## 2014-05-10 DIAGNOSIS — J9622 Acute and chronic respiratory failure with hypercapnia: Secondary | ICD-10-CM | POA: Diagnosis not present

## 2014-05-10 DIAGNOSIS — R059 Cough, unspecified: Secondary | ICD-10-CM

## 2014-05-10 DIAGNOSIS — Z7901 Long term (current) use of anticoagulants: Secondary | ICD-10-CM

## 2014-05-10 DIAGNOSIS — J441 Chronic obstructive pulmonary disease with (acute) exacerbation: Secondary | ICD-10-CM | POA: Diagnosis present

## 2014-05-10 DIAGNOSIS — I4891 Unspecified atrial fibrillation: Secondary | ICD-10-CM | POA: Diagnosis not present

## 2014-05-10 DIAGNOSIS — J9621 Acute and chronic respiratory failure with hypoxia: Secondary | ICD-10-CM

## 2014-05-10 DIAGNOSIS — Z9981 Dependence on supplemental oxygen: Secondary | ICD-10-CM

## 2014-05-10 DIAGNOSIS — J9601 Acute respiratory failure with hypoxia: Secondary | ICD-10-CM | POA: Diagnosis present

## 2014-05-10 DIAGNOSIS — R0989 Other specified symptoms and signs involving the circulatory and respiratory systems: Secondary | ICD-10-CM | POA: Diagnosis not present

## 2014-05-10 DIAGNOSIS — D519 Vitamin B12 deficiency anemia, unspecified: Secondary | ICD-10-CM | POA: Diagnosis present

## 2014-05-10 DIAGNOSIS — I739 Peripheral vascular disease, unspecified: Secondary | ICD-10-CM | POA: Diagnosis present

## 2014-05-10 DIAGNOSIS — J9612 Chronic respiratory failure with hypercapnia: Secondary | ICD-10-CM | POA: Diagnosis present

## 2014-05-10 DIAGNOSIS — Z6824 Body mass index (BMI) 24.0-24.9, adult: Secondary | ICD-10-CM | POA: Diagnosis not present

## 2014-05-10 DIAGNOSIS — Z87891 Personal history of nicotine dependence: Secondary | ICD-10-CM

## 2014-05-10 DIAGNOSIS — E440 Moderate protein-calorie malnutrition: Secondary | ICD-10-CM | POA: Diagnosis present

## 2014-05-10 DIAGNOSIS — F1020 Alcohol dependence, uncomplicated: Secondary | ICD-10-CM | POA: Diagnosis present

## 2014-05-10 DIAGNOSIS — R0603 Acute respiratory distress: Secondary | ICD-10-CM | POA: Diagnosis present

## 2014-05-10 DIAGNOSIS — J9620 Acute and chronic respiratory failure, unspecified whether with hypoxia or hypercapnia: Secondary | ICD-10-CM

## 2014-05-10 DIAGNOSIS — R05 Cough: Secondary | ICD-10-CM

## 2014-05-10 LAB — URINALYSIS, ROUTINE W REFLEX MICROSCOPIC
BILIRUBIN URINE: NEGATIVE
GLUCOSE, UA: 100 mg/dL — AB
Hgb urine dipstick: NEGATIVE
Ketones, ur: NEGATIVE mg/dL
Leukocytes, UA: NEGATIVE
Nitrite: NEGATIVE
Protein, ur: NEGATIVE mg/dL
SPECIFIC GRAVITY, URINE: 1.015 (ref 1.005–1.030)
Urobilinogen, UA: 0.2 mg/dL (ref 0.0–1.0)
pH: 5.5 (ref 5.0–8.0)

## 2014-05-10 LAB — COMPREHENSIVE METABOLIC PANEL
ALBUMIN: 3.6 g/dL (ref 3.5–5.2)
ALK PHOS: 44 U/L (ref 39–117)
ALT: 15 U/L (ref 0–53)
ANION GAP: 6 (ref 5–15)
AST: 22 U/L (ref 0–37)
BUN: 7 mg/dL (ref 6–23)
CALCIUM: 9 mg/dL (ref 8.4–10.5)
CO2: 32 mmol/L (ref 19–32)
Chloride: 97 mmol/L (ref 96–112)
Creatinine, Ser: 0.91 mg/dL (ref 0.50–1.35)
GFR calc Af Amer: >90 mL/min (ref >90)
GFR calc non Af Amer: 81 mL/min — ABNORMAL LOW (ref >90)
Glucose, Bld: 185 mg/dL — ABNORMAL HIGH (ref 70–99)
Potassium: 4.1 mmol/L (ref 3.5–5.1)
SODIUM: 135 mmol/L (ref 135–145)
Total Bilirubin: 1.5 mg/dL — ABNORMAL HIGH (ref 0.3–1.2)
Total Protein: 6.8 g/dL (ref 6.0–8.3)

## 2014-05-10 LAB — CBC WITH DIFFERENTIAL/PLATELET
Basophils Absolute: 0 10*3/uL (ref 0.0–0.1)
Basophils Relative: 0 % (ref 0–1)
EOS PCT: 0 % (ref 0–5)
Eosinophils Absolute: 0 10*3/uL (ref 0.0–0.7)
HCT: 45.4 % (ref 39.0–52.0)
Hemoglobin: 15.1 g/dL (ref 13.0–17.0)
LYMPHS ABS: 0.6 10*3/uL — AB (ref 0.7–4.0)
Lymphocytes Relative: 6 % — ABNORMAL LOW (ref 12–46)
MCH: 34.2 pg — ABNORMAL HIGH (ref 26.0–34.0)
MCHC: 33.3 g/dL (ref 30.0–36.0)
MCV: 102.7 fL — ABNORMAL HIGH (ref 78.0–100.0)
Monocytes Absolute: 1 10*3/uL (ref 0.1–1.0)
Monocytes Relative: 10 % (ref 3–12)
Neutro Abs: 8.4 10*3/uL — ABNORMAL HIGH (ref 1.7–7.7)
Neutrophils Relative %: 84 % — ABNORMAL HIGH (ref 43–77)
PLATELETS: 146 10*3/uL — AB (ref 150–400)
RBC: 4.42 MIL/uL (ref 4.22–5.81)
RDW: 14 % (ref 11.5–15.5)
WBC: 10.1 10*3/uL (ref 4.0–10.5)

## 2014-05-10 LAB — TROPONIN I
Troponin I: 0.04 ng/mL — ABNORMAL HIGH (ref <0.031)
Troponin I: 0.06 ng/mL — ABNORMAL HIGH (ref <0.031)

## 2014-05-10 LAB — I-STAT CG4 LACTIC ACID, ED
LACTIC ACID, VENOUS: 1.7 mmol/L (ref 0.5–2.0)
Lactic Acid, Venous: 2.4 mmol/L (ref 0.5–2.0)

## 2014-05-10 LAB — PROTIME-INR
INR: 2.17 — ABNORMAL HIGH (ref 0.00–1.49)
Prothrombin Time: 24.4 seconds — ABNORMAL HIGH (ref 11.6–15.2)

## 2014-05-10 LAB — I-STAT CHEM 8, ED
BUN: 10 mg/dL (ref 6–23)
CALCIUM ION: 1.1 mmol/L — AB (ref 1.13–1.30)
CHLORIDE: 97 mmol/L (ref 96–112)
Creatinine, Ser: 0.7 mg/dL (ref 0.50–1.35)
Glucose, Bld: 185 mg/dL — ABNORMAL HIGH (ref 70–99)
HEMATOCRIT: 50 % (ref 39.0–52.0)
Hemoglobin: 17 g/dL (ref 13.0–17.0)
Potassium: 4.1 mmol/L (ref 3.5–5.1)
Sodium: 137 mmol/L (ref 135–145)
TCO2: 26 mmol/L (ref 0–100)

## 2014-05-10 LAB — I-STAT VENOUS BLOOD GAS, ED
ACID-BASE EXCESS: 4 mmol/L — AB (ref 0.0–2.0)
BICARBONATE: 31.8 meq/L — AB (ref 20.0–24.0)
O2 SAT: 60 %
TCO2: 34 mmol/L (ref 0–100)
pCO2, Ven: 56.9 mmHg — ABNORMAL HIGH (ref 45.0–50.0)
pH, Ven: 7.355 — ABNORMAL HIGH (ref 7.250–7.300)
pO2, Ven: 33 mmHg (ref 30.0–45.0)

## 2014-05-10 LAB — I-STAT TROPONIN, ED: TROPONIN I, POC: 0.01 ng/mL (ref 0.00–0.08)

## 2014-05-10 LAB — BRAIN NATRIURETIC PEPTIDE: B Natriuretic Peptide: 190 pg/mL — ABNORMAL HIGH (ref 0.0–100.0)

## 2014-05-10 LAB — GLUCOSE, CAPILLARY
GLUCOSE-CAPILLARY: 269 mg/dL — AB (ref 70–99)
Glucose-Capillary: 156 mg/dL — ABNORMAL HIGH (ref 70–99)

## 2014-05-10 LAB — MRSA PCR SCREENING: MRSA BY PCR: NEGATIVE

## 2014-05-10 LAB — TSH: TSH: 0.674 u[IU]/mL (ref 0.350–4.500)

## 2014-05-10 MED ORDER — ONDANSETRON HCL 4 MG/2ML IJ SOLN: 4.0000 mg | Freq: Four times a day (QID) | INTRAMUSCULAR | Status: DC | PRN

## 2014-05-10 MED ORDER — ROSUVASTATIN CALCIUM 10 MG PO TABS
10.0000 mg | ORAL_TABLET | Freq: Every morning | ORAL | Status: DC
  Administered 2014-05-10 – 2014-05-16 (×7): 10 mg via ORAL
  Filled 2014-05-10 (×7): qty 1

## 2014-05-10 MED ORDER — LEVALBUTEROL HCL 1.25 MG/0.5ML IN NEBU
1.2500 mg | INHALATION_SOLUTION | Freq: Once | RESPIRATORY_TRACT | Status: AC
  Administered 2014-05-10: 1.25 mg via RESPIRATORY_TRACT
  Filled 2014-05-10: qty 0.5

## 2014-05-10 MED ORDER — LORAZEPAM 2 MG/ML IJ SOLN
1.0000 mg | Freq: Four times a day (QID) | INTRAMUSCULAR | Status: AC | PRN
  Administered 2014-05-10 (×2): 1 mg via INTRAVENOUS
  Filled 2014-05-10 (×2): qty 1

## 2014-05-10 MED ORDER — DOCUSATE SODIUM 100 MG PO CAPS
100.0000 mg | ORAL_CAPSULE | Freq: Two times a day (BID) | ORAL | Status: DC
  Administered 2014-05-10 – 2014-05-16 (×9): 100 mg via ORAL
  Filled 2014-05-10 (×15): qty 1

## 2014-05-10 MED ORDER — IPRATROPIUM BROMIDE 0.02 % IN SOLN
0.5000 mg | Freq: Once | RESPIRATORY_TRACT | Status: AC
  Administered 2014-05-10: 0.5 mg via RESPIRATORY_TRACT
  Filled 2014-05-10: qty 2.5

## 2014-05-10 MED ORDER — FUROSEMIDE 40 MG PO TABS
40.0000 mg | ORAL_TABLET | Freq: Every day | ORAL | Status: DC
  Filled 2014-05-10 (×2): qty 1

## 2014-05-10 MED ORDER — PANTOPRAZOLE SODIUM 40 MG PO TBEC
40.0000 mg | DELAYED_RELEASE_TABLET | Freq: Every day | ORAL | Status: DC
  Administered 2014-05-10 – 2014-05-16 (×7): 40 mg via ORAL
  Filled 2014-05-10 (×7): qty 1

## 2014-05-10 MED ORDER — FOLIC ACID 1 MG PO TABS
1.0000 mg | ORAL_TABLET | Freq: Every day | ORAL | Status: DC
  Administered 2014-05-10 – 2014-05-16 (×7): 1 mg via ORAL
  Filled 2014-05-10 (×7): qty 1

## 2014-05-10 MED ORDER — LEVALBUTEROL HCL 0.63 MG/3ML IN NEBU
0.6300 mg | INHALATION_SOLUTION | Freq: Four times a day (QID) | RESPIRATORY_TRACT | Status: DC | PRN
  Administered 2014-05-10: 0.63 mg via RESPIRATORY_TRACT
  Filled 2014-05-10: qty 3

## 2014-05-10 MED ORDER — ADULT MULTIVITAMIN W/MINERALS CH
1.0000 | ORAL_TABLET | Freq: Every day | ORAL | Status: DC
  Administered 2014-05-10 – 2014-05-16 (×7): 1 via ORAL
  Filled 2014-05-10 (×7): qty 1

## 2014-05-10 MED ORDER — ALBUTEROL SULFATE (2.5 MG/3ML) 0.083% IN NEBU: 2.5000 mg | INHALATION_SOLUTION | RESPIRATORY_TRACT | Status: DC | PRN

## 2014-05-10 MED ORDER — PNEUMOCOCCAL VAC POLYVALENT 25 MCG/0.5ML IJ INJ
0.5000 mL | INJECTION | INTRAMUSCULAR | Status: DC
  Filled 2014-05-10: qty 0.5

## 2014-05-10 MED ORDER — METHYLPREDNISOLONE SODIUM SUCC 40 MG IJ SOLR
40.0000 mg | Freq: Two times a day (BID) | INTRAMUSCULAR | Status: DC
  Administered 2014-05-10 (×2): 40 mg via INTRAVENOUS
  Filled 2014-05-10 (×6): qty 1

## 2014-05-10 MED ORDER — DILTIAZEM HCL ER COATED BEADS 180 MG PO CP24
180.0000 mg | ORAL_CAPSULE | Freq: Every day | ORAL | Status: DC
  Administered 2014-05-10 – 2014-05-16 (×7): 180 mg via ORAL
  Filled 2014-05-10 (×7): qty 1

## 2014-05-10 MED ORDER — LEVOFLOXACIN IN D5W 500 MG/100ML IV SOLN
500.0000 mg | INTRAVENOUS | Status: DC
  Administered 2014-05-10 – 2014-05-11 (×2): 500 mg via INTRAVENOUS
  Filled 2014-05-10 (×2): qty 100

## 2014-05-10 MED ORDER — OXYCODONE HCL 5 MG PO TABS
5.0000 mg | ORAL_TABLET | ORAL | Status: DC | PRN
  Administered 2014-05-11: 5 mg via ORAL
  Filled 2014-05-10: qty 1

## 2014-05-10 MED ORDER — METHYLPREDNISOLONE SODIUM SUCC 125 MG IJ SOLR
125.0000 mg | Freq: Once | INTRAMUSCULAR | Status: AC
  Administered 2014-05-10: 125 mg via INTRAVENOUS
  Filled 2014-05-10: qty 2

## 2014-05-10 MED ORDER — ALUM & MAG HYDROXIDE-SIMETH 200-200-20 MG/5ML PO SUSP: 30.0000 mL | Freq: Four times a day (QID) | ORAL | Status: DC | PRN

## 2014-05-10 MED ORDER — AMIODARONE LOAD VIA INFUSION
150.0000 mg | Freq: Once | INTRAVENOUS | Status: DC
  Filled 2014-05-10: qty 83.34

## 2014-05-10 MED ORDER — SODIUM CHLORIDE 0.9 % IJ SOLN
3.0000 mL | Freq: Two times a day (BID) | INTRAMUSCULAR | Status: DC
Start: 2014-05-10 — End: 2014-05-16
  Administered 2014-05-10 – 2014-05-16 (×12): 3 mL via INTRAVENOUS

## 2014-05-10 MED ORDER — WARFARIN - PHARMACIST DOSING INPATIENT
Freq: Every day | Status: DC
  Administered 2014-05-12: 1
  Administered 2014-05-13: 17:00:00

## 2014-05-10 MED ORDER — SODIUM CHLORIDE 0.9 % IV SOLN: INTRAVENOUS | Status: DC

## 2014-05-10 MED ORDER — DM-GUAIFENESIN ER 30-600 MG PO TB12
1.0000 | ORAL_TABLET | Freq: Two times a day (BID) | ORAL | Status: DC
Start: 2014-05-10 — End: 2014-05-11
  Administered 2014-05-10 (×2): 1 via ORAL
  Filled 2014-05-10 (×4): qty 1

## 2014-05-10 MED ORDER — MOMETASONE FURO-FORMOTEROL FUM 100-5 MCG/ACT IN AERO
2.0000 | INHALATION_SPRAY | Freq: Two times a day (BID) | RESPIRATORY_TRACT | Status: DC
  Administered 2014-05-10: 2 via RESPIRATORY_TRACT
  Filled 2014-05-10: qty 8.8

## 2014-05-10 MED ORDER — MAGNESIUM SULFATE 2 GM/50ML IV SOLN
2.0000 g | Freq: Once | INTRAVENOUS | Status: AC
  Administered 2014-05-10: 2 g via INTRAVENOUS
  Filled 2014-05-10: qty 50

## 2014-05-10 MED ORDER — SODIUM CHLORIDE 0.9 % IV SOLN
INTRAVENOUS | Status: AC
  Administered 2014-05-10: 10:00:00 via INTRAVENOUS

## 2014-05-10 MED ORDER — AMIODARONE HCL IN DEXTROSE 360-4.14 MG/200ML-% IV SOLN
30.0000 mg/h | INTRAVENOUS | Status: AC
  Administered 2014-05-10 – 2014-05-11 (×2): 30 mg/h via INTRAVENOUS
  Filled 2014-05-10 (×7): qty 200

## 2014-05-10 MED ORDER — LORAZEPAM 0.5 MG PO TABS
1.0000 mg | ORAL_TABLET | Freq: Four times a day (QID) | ORAL | Status: AC | PRN
  Administered 2014-05-11 – 2014-05-13 (×2): 1 mg via ORAL
  Filled 2014-05-10 (×2): qty 2

## 2014-05-10 MED ORDER — VITAMIN B-12 1000 MCG PO TABS
2000.0000 ug | ORAL_TABLET | Freq: Every day | ORAL | Status: DC
  Administered 2014-05-10 – 2014-05-16 (×8): 2000 ug via ORAL
  Filled 2014-05-10 (×7): qty 2

## 2014-05-10 MED ORDER — VITAMIN B-1 100 MG PO TABS
100.0000 mg | ORAL_TABLET | Freq: Every day | ORAL | Status: DC
  Administered 2014-05-10 – 2014-05-16 (×6): 100 mg via ORAL
  Filled 2014-05-10 (×7): qty 1

## 2014-05-10 MED ORDER — FUROSEMIDE 10 MG/ML IJ SOLN
20.0000 mg | Freq: Once | INTRAMUSCULAR | Status: AC
  Administered 2014-05-10: 20 mg via INTRAVENOUS
  Filled 2014-05-10: qty 2

## 2014-05-10 MED ORDER — THIAMINE HCL 100 MG/ML IJ SOLN
100.0000 mg | Freq: Every day | INTRAMUSCULAR | Status: DC
  Administered 2014-05-13: 100 mg via INTRAVENOUS
  Filled 2014-05-10 (×7): qty 1

## 2014-05-10 MED ORDER — ONDANSETRON HCL 4 MG PO TABS
4.0000 mg | ORAL_TABLET | Freq: Four times a day (QID) | ORAL | Status: DC | PRN
Start: 2014-05-10 — End: 2014-05-16

## 2014-05-10 MED ORDER — INSULIN ASPART 100 UNIT/ML ~~LOC~~ SOLN
0.0000 [IU] | Freq: Three times a day (TID) | SUBCUTANEOUS | Status: DC
  Administered 2014-05-10: 5 [IU] via SUBCUTANEOUS
  Administered 2014-05-11 (×2): 2 [IU] via SUBCUTANEOUS
  Administered 2014-05-11: 7 [IU] via SUBCUTANEOUS
  Administered 2014-05-12: 9 [IU] via SUBCUTANEOUS
  Administered 2014-05-12: 1 [IU] via SUBCUTANEOUS
  Administered 2014-05-12 – 2014-05-13 (×2): 5 [IU] via SUBCUTANEOUS
  Administered 2014-05-13 – 2014-05-14 (×3): 3 [IU] via SUBCUTANEOUS
  Administered 2014-05-14: 7 [IU] via SUBCUTANEOUS

## 2014-05-10 MED ORDER — TIOTROPIUM BROMIDE MONOHYDRATE 18 MCG IN CAPS
1.0000 | ORAL_CAPSULE | Freq: Every day | RESPIRATORY_TRACT | Status: DC
  Filled 2014-05-10: qty 5

## 2014-05-10 MED ORDER — METOPROLOL TARTRATE 1 MG/ML IV SOLN
2.5000 mg | Freq: Once | INTRAVENOUS | Status: AC
  Administered 2014-05-10: 2.5 mg via INTRAVENOUS
  Filled 2014-05-10: qty 5

## 2014-05-10 MED ORDER — AMIODARONE HCL IN DEXTROSE 360-4.14 MG/200ML-% IV SOLN
60.0000 mg/h | INTRAVENOUS | Status: AC
  Administered 2014-05-10: 60 mg/h via INTRAVENOUS
  Filled 2014-05-10 (×2): qty 200

## 2014-05-10 MED ORDER — ACETAMINOPHEN 325 MG PO TABS: 650.0000 mg | ORAL_TABLET | Freq: Four times a day (QID) | ORAL | Status: DC | PRN

## 2014-05-10 MED ORDER — ASPIRIN EC 81 MG PO TBEC
81.0000 mg | DELAYED_RELEASE_TABLET | Freq: Every day | ORAL | Status: DC
  Administered 2014-05-10 – 2014-05-16 (×7): 81 mg via ORAL
  Filled 2014-05-10 (×7): qty 1

## 2014-05-10 MED ORDER — ACETAMINOPHEN 650 MG RE SUPP: 650.0000 mg | Freq: Four times a day (QID) | RECTAL | Status: DC | PRN

## 2014-05-10 MED ORDER — LEVALBUTEROL HCL 0.63 MG/3ML IN NEBU
0.6300 mg | INHALATION_SOLUTION | Freq: Four times a day (QID) | RESPIRATORY_TRACT | Status: DC
  Administered 2014-05-10 – 2014-05-13 (×14): 0.63 mg via RESPIRATORY_TRACT
  Filled 2014-05-10 (×24): qty 3

## 2014-05-10 MED ORDER — WARFARIN SODIUM 2.5 MG PO TABS
2.5000 mg | ORAL_TABLET | Freq: Once | ORAL | Status: AC
  Administered 2014-05-10: 2.5 mg via ORAL
  Filled 2014-05-10: qty 1

## 2014-05-10 NOTE — Progress Notes (Signed)
ANTICOAGULATION CONSULT NOTE - Initial Consult  Pharmacy Consult for warfarin Indication: atrial fibrillation  Allergies  Allergen Reactions  . Xarelto [Rivaroxaban] Other (See Comments)    makes pt crazy  . Ticlopidine Hcl Swelling    Patient Measurements: Height: 5\' 5"  (165.1 cm) Weight: 143 lb 1.3 oz (64.9 kg) IBW/kg (Calculated) : 61.5  Vital Signs: Temp: 97.8 F (36.6 C) (02/13 1235) Temp Source: Axillary (02/13 1235) BP: 131/109 mmHg (02/13 1405) Pulse Rate: 103 (02/13 1405)  Labs:  Recent Labs  05/08/14 1127 05/10/14 0718 05/10/14 0724  HGB  --  15.1 17.0  HCT  --  45.4 50.0  PLT  --  146*  --   LABPROT  --  24.4*  --   INR 2.2 2.17*  --   CREATININE  --  0.91 0.70  TROPONINI  --  <0.03  --     Estimated Creatinine Clearance: 70.5 mL/min (by C-G formula based on Cr of 0.7).   Medical History: Past Medical History  Diagnosis Date  . HYPERLIPIDEMIA 11/21/2006  . HYPERTENSION 11/21/2006  . MYOCARDIAL INFARCTION, HX OF 11/21/2006  . CORONARY ARTERY DISEASE 11/21/2006  . OSTEOPOROSIS 11/21/2006  . COLONIC POLYPS 05/21/2007  . COPD 05/21/2007  . HIATAL HERNIA 05/21/2007  . DIVERTICULAR DISEASE 05/21/2007  . NEPHROLITHIASIS 05/21/2007  . GI BLEEDING 09/09/2008  . ANEMIA, B12 DEFICIENCY 12/02/2008  . CAROTID ARTERY STENOSIS 04/16/2009  . PERIPHERAL VASCULAR DISEASE 09/02/2009  . Atrial fibrillation 11/06/2009  . Allergic rhinitis   . Osteoporosis   . Hemoptysis   . Internal hemorrhoids   . GERD (gastroesophageal reflux disease)   . Cataract   . Diabetes mellitus without complication     Medications:  Prescriptions prior to admission  Medication Sig Dispense Refill Last Dose  . Calcium Carbonate-Vit D-Min (CALTRATE 600+D PLUS) 600-400 MG-UNIT per tablet Chew 1 tablet by mouth daily.     05/09/2014 at Unknown time  . cetirizine (ZYRTEC ALLERGY) 10 MG tablet Take 1 tablet (10 mg total) by mouth every morning.   05/09/2014 at Unknown time  . CRESTOR 20 MG tablet  TAKE 1/2 TABLET BY MOUTH EVERY MORNING (Patient taking differently: Take 10 mg by mouth every morning) 30 tablet 2 05/09/2014 at Unknown time  . cyanocobalamin ([employer] VITAMIN B12) 2000 MCG tablet Take 1 tablet (2,000 mcg total) by mouth daily.   05/09/2014 at Unknown time  . dextromethorphan-guaiFENesin (MUCINEX DM) 30-600 MG per 12 hr tablet Take 1 tablet by mouth every 12 (twelve) hours as needed. cough    05/09/2014 at Unknown time  . diltiazem (CARDIZEM CD) 180 MG 24 hr capsule Take 1 capsule (180 mg total) by mouth daily. 90 capsule 3 05/09/2014 at Unknown time  . DULERA 100-5 MCG/ACT AERO INHALE 2 PUFFS INTO THE LUNGS TWICE A DAY (Patient taking differently: Inhale 2 puffs into the lungs twice daily) 13 g 2 05/09/2014 at Unknown time  . furosemide (LASIX) 40 MG tablet Take 1 tablet (40 mg total) by mouth daily. 30 tablet 3 05/09/2014 at Unknown time  . pantoprazole (PROTONIX) 40 MG tablet TAKE 1 TABLET BY MOUTH TWICE A DAY (Patient taking differently: Take 40 mg by mouth once daily) 180 tablet 1 05/09/2014 at Unknown time  . potassium chloride SA (K-DUR,KLOR-CON) 20 MEQ tablet Take 1 tablet (20 mEq total) by mouth daily. 30 tablet 3 05/09/2014 at Unknown time  . SPIRIVA HANDIHALER 18 MCG inhalation capsule INHALE CONTENTS OF 1 CAPSULE DAILY 30 capsule 0 05/09/2014 at Unknown time  .  triamcinolone (KENALOG) 0.025 % ointment APPLY 1 A SMALL AMOUNT TO AFFECTED AREA TWICE A DAY (Patient taking differently: Apply a small amount to affected area twice daily as needed for rash) 80 g 2 05/09/2014 at Unknown time  . warfarin (COUMADIN) 5 MG tablet Take 1 tablet (5 mg total) by mouth daily. (Patient taking differently: Take 2.5-5 mg by mouth daily. Take 2.5 mg by mouth on Sunday, Monday, Wednesday,Friday and Saturday . Take 5 mg by mouth on all other days.) 30 tablet 6 05/09/2014 at Unknown time  . XOPENEX HFA 45 MCG/ACT inhaler INHALE 2 PUFFS INTO THE LUNGS EVERY 4 (FOUR) HOURS AS NEEDED FOR WHEEZING. 15 Inhaler 2  05/09/2014 at Unknown time    Assessment: 74 y/o male with end stage COPD who presented to the ED with increased SOB and found to be in Afib with RVR. Of note, patient s/p DCCV on 1/20. He takes chronic warfarin for Afib. Starting amiodarone for rhythm control with known interaction with warfarin - will have to watch INR, expect to decrease weekly dose by ~50% when amiodarone starts working. Also on Levaquin for COPD exacerbation which can potentiate INR. INR is therapeutic today at 2.17. No bleeding noted, H/H are normal, platelets are low at 146.  PTA: 2.5 mg/d x 5 mg TTh (22.5 mg/wk)  Goal of Therapy:  INR 2-3 Monitor platelets by anticoagulation protocol: Yes   Plan:  - Warfarin 2.5 mg PO tonight - INR daily -   [facility], Pharm.D., BCPS Clinical Pharmacist Pager: [phone] 05/10/2014 2:18 PM

## 2014-05-10 NOTE — H&P (Signed)
[facility] History and Physical                                                                                    [name], is a 74 y.o. male  MRN: [id number]   DOB - 09-03-1940  Admit Date - 05/10/2014  Outpatient Primary MD for the patient is Garret Reddish, MD  With History of -  Past Medical History  Diagnosis Date  . HYPERLIPIDEMIA 11/21/2006  . HYPERTENSION 11/21/2006  . MYOCARDIAL INFARCTION, HX OF 11/21/2006  . CORONARY ARTERY DISEASE 11/21/2006  . OSTEOPOROSIS 11/21/2006  . COLONIC POLYPS 05/21/2007  . COPD 05/21/2007  . HIATAL HERNIA 05/21/2007  . DIVERTICULAR DISEASE 05/21/2007  . NEPHROLITHIASIS 05/21/2007  . GI BLEEDING 09/09/2008  . ANEMIA, B12 DEFICIENCY 12/02/2008  . CAROTID ARTERY STENOSIS 04/16/2009  . PERIPHERAL VASCULAR DISEASE 09/02/2009  . Atrial fibrillation 11/06/2009  . Allergic rhinitis   . Osteoporosis   . Hemoptysis   . Internal hemorrhoids   . GERD (gastroesophageal reflux disease)   . Cataract   . Diabetes mellitus without complication       Past Surgical History  Procedure Laterality Date  . Thyroidectomy      partial  . Coronary stent placement      03/07/2001  . Cholecystectomy    . Appendectomy    . Hemorrhoid surgery    . Inguinal hernia repair  10/10/08  . Coronary artery bypass graft  2011  . Ptca    . Cardioversion N/A 04/16/2014    Procedure: CARDIOVERSION;  Surgeon: Josue Hector, MD;  Location: [facility] ENDOSCOPY;  Service: Cardiovascular;  Laterality: N/A;    in for   Chief Complaint  Patient presents with  . Shortness of Breath     HPI  Elizabeth Haff  is a 74 y.o. male, with a past medical history of CABG, COPD, diabetes, peripheral vascular disease, and atrial fibrillation, who underwent cardioversion on January 20. He reports feeling well until last night at 11 PM when he went to lay down in bed. He states his heart rate shot up to 300 and he immediately became short of breath. He went downstairs and rested on the couch and  initially his heart rate improved to 78, but then it went back up again to 300 (per his report). Mr. Muzquiz says he has been coughing frequently and bringing up brownish black sputum. He denies any fever, sore throat, chills. He does not currently have chest pain. He reports similar episodes of being short of breath when his heart rate increases. He endorses PND in the past. He denies any lower extremity swelling. He states he has not urinated yet today but has no recent frequency or dysuria. He denies diarrhea and vomiting. He has a 60-pack-year smoking history, but quit smoking in 2011. He reports drinking one fifth of alcohol weekly. He lives with his son and reports being rather sedentary "I can't do much". He is a DO NOT RESUSCITATE.  Review of Systems   In addition to the HPI above,  No Fever-chills, No Headache, No changes with Vision or hearing,  No problems swallowing food or Liquids, No Blood in stool  or Urine, No dysuria, No new skin rashes or bruises, No new joints pains-aches,   A full 10 point Review of Systems was done, except as stated above, all other Review of Systems were negative.  Social History History  Substance Use Topics  . Smoking status: Former Smoker -- 3.00 packs/day for 50 years    Types: Cigarettes    Quit date: 09/25/2009  . Smokeless tobacco: Never Used  . Alcohol Use: 3.0 oz/week    6 Standard drinks or equivalent per week     Comment: [place] 2-3 a night   lives with his son and is somewhat independent with his ADLs.  Family History Family History  Problem Relation Age of Onset  . Arthritis Mother     rheumatoid  . Heart attack Father   . Heart disease Father   . Heart disease Brother   . Colon cancer Brother 34  . Stomach cancer Neg Hx   . Heart disease Brother     Prior to Admission medications   Medication Sig Start Date End Date Taking? Authorizing Provider  Calcium Carbonate-Vit D-Min (CALTRATE 600+D PLUS) 600-400 MG-UNIT per tablet Chew  1 tablet by mouth daily.     Yes Historical Provider, MD  cetirizine (ZYRTEC ALLERGY) 10 MG tablet Take 1 tablet (10 mg total) by mouth every morning. 08/17/10  Yes Tammy S Parrett, NP  CRESTOR 20 MG tablet TAKE 1/2 TABLET BY MOUTH EVERY MORNING Patient taking differently: Take 10 mg by mouth every morning 04/14/14  Yes Marin Olp, MD  cyanocobalamin ([employer] VITAMIN B12) 2000 MCG tablet Take 1 tablet (2,000 mcg total) by mouth daily. 04/02/12  Yes Lisabeth Pick, MD  dextromethorphan-guaiFENesin (MUCINEX DM) 30-600 MG per 12 hr tablet Take 1 tablet by mouth every 12 (twelve) hours as needed. cough    Yes Historical Provider, MD  diltiazem (CARDIZEM CD) 180 MG 24 hr capsule Take 1 capsule (180 mg total) by mouth daily. 03/17/14  Yes Burtis Junes, NP  DULERA 100-5 MCG/ACT AERO INHALE 2 PUFFS INTO THE LUNGS TWICE A DAY Patient taking differently: Inhale 2 puffs into the lungs twice daily 11/28/12  Yes Bruce Kendall Flack, MD  furosemide (LASIX) 40 MG tablet Take 1 tablet (40 mg total) by mouth daily. 03/17/14  Yes Burtis Junes, NP  pantoprazole (PROTONIX) 40 MG tablet TAKE 1 TABLET BY MOUTH TWICE A DAY Patient taking differently: Take 40 mg by mouth once daily   Yes Bruce Kendall Flack, MD  potassium chloride SA (K-DUR,KLOR-CON) 20 MEQ tablet Take 1 tablet (20 mEq total) by mouth daily. 03/17/14  Yes Burtis Junes, NP  SPIRIVA HANDIHALER 18 MCG inhalation capsule INHALE CONTENTS OF 1 CAPSULE DAILY 01/24/14  Yes Bruce Kendall Flack, MD  triamcinolone (KENALOG) 0.025 % ointment APPLY 1 A SMALL AMOUNT TO AFFECTED AREA TWICE A DAY Patient taking differently: Apply a small amount to affected area twice daily as needed for rash 09/10/10  Yes Bruce Kendall Flack, MD  warfarin (COUMADIN) 5 MG tablet Take 1 tablet (5 mg total) by mouth daily. Patient taking differently: Take 2.5-5 mg by mouth daily. Take 2.5 mg by mouth on Sunday, Monday, Wednesday,Friday and Saturday . Take 5 mg by mouth on all other days. 03/05/14  Yes  Michele M Lenze, PA-C  XOPENEX HFA 45 MCG/ACT inhaler INHALE 2 PUFFS INTO THE LUNGS EVERY 4 (FOUR) HOURS AS NEEDED FOR WHEEZING. 05/08/14  Yes Marin Olp, MD    Allergies  Allergen Reactions  .  Xarelto [Rivaroxaban] Other (See Comments)    makes pt crazy  . Ticlopidine Hcl Swelling    Physical Exam  Vitals  Blood pressure 129/76, pulse 106, temperature 98.7 F (37.1 C), temperature source Axillary, resp. rate 24, height 5\' 5"  (1.651 m), weight 66.225 kg (146 lb), SpO2 96 %.   General: Well-developed, thin, pleasant elderly male lying in bed, on BiPAP, but able to communicate clearly   Psych:  Normal affect and insight, Not Suicidal or Homicidal, Awake Alert, Oriented X 3.  Neuro:   No F.N deficits, ALL C.Nerves Intact, Strength 5/5 all 4 extremities, Sensation intact all 4 extremities.  ENT:  Ears and Eyes appear Normal, mouth and throat were not examined as patient was on BiPAP  Neck:  Supple, No lymphadenopathy appreciated  Respiratory:  On BiPAP. Diffuse rales heard in all posterior lung areas.  Cardiac:Tachycardic, No Murmurs, no LE edema noted, no JVD.    Abdomen:  Positive bowel sounds, Soft, Non tender, Non distended,  No masses appreciated  Skin:  No Cyanosis, decreased Skin Turgor, No Skin Rash or Bruise.  Extremities:  Able to move all 4. 5/5 strength in each,  no effusions.  Data Review  CBC  Recent Labs Lab 05/10/14 0718 05/10/14 0724  WBC 10.1  --   HGB 15.1 17.0  HCT 45.4 50.0  PLT 146*  --   MCV 102.7*  --   MCH 34.2*  --   MCHC 33.3  --   RDW 14.0  --   LYMPHSABS 0.6*  --   MONOABS 1.0  --   EOSABS 0.0  --   BASOSABS 0.0  --     Chemistries   Recent Labs Lab 05/10/14 0718 05/10/14 0724  NA 135 137  K 4.1 4.1  CL 97 97  CO2 32  --   GLUCOSE 185* 185*  BUN 7 10  CREATININE 0.91 0.70  CALCIUM 9.0  --   AST 22  --   ALT 15  --   ALKPHOS 44  --   BILITOT 1.5*  --     Coagulation profile  Recent Labs Lab 05/08/14 1127  05/10/14 0718  INR 2.2 2.17*    Cardiac Enzymes  Recent Labs Lab 05/10/14 0718  TROPONINI <0.03    Urinalysis    Component Value Date/Time   COLORURINE YELLOW 10/08/2009 1028   APPEARANCEUR CLEAR 10/08/2009 1028   LABSPEC 1.022 10/08/2009 1028   PHURINE 7.0 10/08/2009 1028   GLUCOSEU NEGATIVE 10/08/2009 1028   HGBUR NEGATIVE 10/08/2009 1028   HGBUR negative 02/04/2008 [place] 10/08/2009 1028   KETONESUR NEGATIVE 10/08/2009 1028   PROTEINUR NEGATIVE 10/08/2009 1028   UROBILINOGEN 1.0 10/08/2009 1028   NITRITE NEGATIVE 10/08/2009 1028   LEUKOCYTESUR  10/08/2009 1028    NEGATIVE MICROSCOPIC NOT DONE ON URINES WITH NEGATIVE PROTEIN, BLOOD, LEUKOCYTES, NITRITE, OR GLUCOSE <1000 mg/dL.    Imaging results:   Dg Chest Portable 1 View  05/10/2014   CLINICAL DATA:  Shortness of breath  EXAM: PORTABLE CHEST - 1 VIEW  COMPARISON:  04/03/2014  FINDINGS: Multiple monitoring leads overlie the patient. Stable cardiac and mediastinal contours status post median sternotomy. No consolidative pulmonary opacities. No pleural effusion or pneumothorax. Regional skeleton is unremarkable.  IMPRESSION: No acute cardiopulmonary process.   Electronically Signed   By: Lovey Newcomer M.D.   On: 05/10/2014 08:06    My personal review of EKG: afib   Assessment & Plan  Principal Problem:   Acute  respiratory distress Active Problems:   Hyperlipidemia   Essential hypertension   CAD with history MI   COPD GOLD III   GERD (gastroesophageal reflux disease)   Respiratory failure with hypercapnia   Atrial fibrillation with RVR   Alcohol dependence  Acute respiratory failure with distress Likely caused by A. fib with RVR. Probably also an element of COPD exacerbation. Patient is currently on BiPAP. He does not want to be intubated. He is fully anticoagulated on Coumadin. He will be admitted to the stepdown unit.  Atrial fibrillation with rapid ventricular response Appreciate  cardiology consultation. Patient was successfully cardioverted on January 20. The patient has not had his diltiazem yet today. Will reorder home medications, but defer treatment to cardiology. Suspect an element of diastolic heart failure. 2-D echo in January showed a preserved ejection fraction.  BNP is 190. Troponin I is 0.03. Will continue to cycle troponin, place on heart healthy diet with daily weights, Is & Os. Continue Coumadin per pharmacy.  Recommend case management consultation for Xarelto as patient says he cannot afford it.  COPD with possible exacerbation. Chest x-ray is essentially negative but suspect the patient is slightly dry. Will repeat chest x-ray 2/14. Patient reports coughing up blackish brown sputum. IV Solu-Medrol 40 twice a day, Levaquin, Xopenex nebulizers started. Will continue Spiriva and Dulera that he takes at home. If the patient improved significantly with stabilization of his heart rhythm would discontinue Solu-Medrol and Levaquin 2/14.  Alcohol dependence Patient states he drinks one fifth a week. Will order CIWA protocol.  Diabetes mellitus I do not see any home medications listed for diabetes, but will start SSI-S particularly since we are starting steroids. Check hemoglobin A1c.  Coronary artery disease status post CABG. Patient denies chest pain. Continue Crestor.     DVT Prophylaxis:Coumadin per pharmacy   AM Labs Ordered, also please review Full Orders  Family Communication:   Son, Sherard Sutch, at bedside   Code Status:  DO NOT RESUSCITATE   Condition:  Guarded   Time spent in minutes : [place],  PA-C on 05/10/2014 at 11:01 AM  Between 7am to 7pm - Pager - [phone]  After 7pm go to [email or web address] - password [email or web address]  And look for the night coverage person covering me after hours  [facility]

## 2014-05-10 NOTE — ED Notes (Signed)
Lactic acid results given to Dr. Wyvonnia Dusky

## 2014-05-10 NOTE — Consult Note (Signed)
CARDIOLOGY CONSULT NOTE  Referring Physician: [facility] Cardiologist: Burt Knack Reason for Consultation: AFIB   HPI:  [name] is a 74 y.o. male, with a past medical history of CABG, end-stage COPD, ETOH abuse, diabetes, peripheral vascular disease, and atrial fibrillation, who underwent cardioversion on January 20.   He reports feeling well until last night at 11 PM when he went to lie down in bed. He states his heart rate "shot up to 300" and he immediately became short of breath. He went downstairs and rested on the couch and initially his heart rate improved to 78, but then it went back up again to 300 (per his report). [name] says he has been coughing frequently and bringing up brownish black sputum. He denies any fever, sore throat, chills. Does have mouth sores. He does not currently have chest pain. He reports similar episodes of being short of breath when his heart rate increases. He endorses PND in the past. He denies any lower extremity swelling. He has a 60-pack-year smoking history, but quit smoking in 2011. He reports drinking one fifth of alcohol weekly. He lives with his son and reports being rather sedentary "I can't do much". He is DNR.  Previously was on Xarelto but couldn't afford now on coumadin. Denies bleeding. INRs have been well managed.   In ER found to be in recurrent AF rate 121. Admitted to [facility]. Still in AF but rate 100-110 now. CXR = COPD. No acute process. BNP 190. Trop < 0.03    Review of Systems:     Cardiac Review of Systems: {Y] = yes [ ]  = no  Chest Pain [    ]  Resting SOB [ y  ] Exertional SOB  Blue.Reese  ]  Orthopnea [  ]   Pedal Edema [   ]    Palpitations Blue.Reese  ] Syncope  [  ]   Presyncope [   ]  General Review of Systems: [Y] = yes [  ]=no Constitional: recent weight change [  ]; anorexia [  ]; fatigue [ y ]; nausea [  ]; night sweats [  ]; fever [  ]; or chills [  ];                 Eyes : blurred vision [  ]; diplopia [   ]; vision changes [  ];   Amaurosis fugax[  ]; Resp: cough [ y ];  wheezing[y  ];  hemoptysis[  ];  PND [  ];  GI:  gallstones[  ], vomiting[  ];  dysphagia[  ]; melena[  ];  hematochezia [  ]; heartburn[  ];   GU: kidney stones [  ]; hematuria[  ];   dysuria [  ];  nocturia[  ]; incontinence [  ];             Skin: rash, swelling[  ];, hair loss[  ];  peripheral edema[  ];  or itching[  ]; Musculosketetal: myalgias[  ];  joint swelling[  ];  joint erythema[  ];  joint pain[y  ];  back pain[y  ];  Heme/Lymph: bruising[  ];  bleeding[  ];  anemia[  ];  Neuro: TIA[  ];  headaches[  ];  stroke[  ];  vertigo[  ];  seizures[  ];   paresthesias[  ];  difficulty walking[  ];  Psych:depression[  ]; anxiety[  ];  Endocrine: diabetes[ y ];  thyroid dysfunction[  ];  Other:  Past Medical History  Diagnosis Date  . HYPERLIPIDEMIA 11/21/2006  . HYPERTENSION 11/21/2006  . MYOCARDIAL INFARCTION, HX OF 11/21/2006  . CORONARY ARTERY DISEASE 11/21/2006  . OSTEOPOROSIS 11/21/2006  . COLONIC POLYPS 05/21/2007  . COPD 05/21/2007  . HIATAL HERNIA 05/21/2007  . DIVERTICULAR DISEASE 05/21/2007  . NEPHROLITHIASIS 05/21/2007  . GI BLEEDING 09/09/2008  . ANEMIA, B12 DEFICIENCY 12/02/2008  . CAROTID ARTERY STENOSIS 04/16/2009  . PERIPHERAL VASCULAR DISEASE 09/02/2009  . Atrial fibrillation 11/06/2009  . Allergic rhinitis   . Osteoporosis   . Hemoptysis   . Internal hemorrhoids   . GERD (gastroesophageal reflux disease)   . Cataract   . Diabetes mellitus without complication     Medications Prior to Admission  Medication Sig Dispense Refill  . Calcium Carbonate-Vit D-Min (CALTRATE 600+D PLUS) 600-400 MG-UNIT per tablet Chew 1 tablet by mouth daily.      . cetirizine (ZYRTEC ALLERGY) 10 MG tablet Take 1 tablet (10 mg total) by mouth every morning.    Marland Kitchen CRESTOR 20 MG tablet TAKE 1/2 TABLET BY MOUTH EVERY MORNING (Patient taking differently: Take 10 mg by mouth every morning) 30 tablet 2  . cyanocobalamin ([employer] VITAMIN B12) 2000 MCG tablet Take 1  tablet (2,000 mcg total) by mouth daily.    Marland Kitchen dextromethorphan-guaiFENesin (MUCINEX DM) 30-600 MG per 12 hr tablet Take 1 tablet by mouth every 12 (twelve) hours as needed. cough     . diltiazem (CARDIZEM CD) 180 MG 24 hr capsule Take 1 capsule (180 mg total) by mouth daily. 90 capsule 3  . DULERA 100-5 MCG/ACT AERO INHALE 2 PUFFS INTO THE LUNGS TWICE A DAY (Patient taking differently: Inhale 2 puffs into the lungs twice daily) 13 g 2  . furosemide (LASIX) 40 MG tablet Take 1 tablet (40 mg total) by mouth daily. 30 tablet 3  . pantoprazole (PROTONIX) 40 MG tablet TAKE 1 TABLET BY MOUTH TWICE A DAY (Patient taking differently: Take 40 mg by mouth once daily) 180 tablet 1  . potassium chloride SA (K-DUR,KLOR-CON) 20 MEQ tablet Take 1 tablet (20 mEq total) by mouth daily. 30 tablet 3  . SPIRIVA HANDIHALER 18 MCG inhalation capsule INHALE CONTENTS OF 1 CAPSULE DAILY 30 capsule 0  . triamcinolone (KENALOG) 0.025 % ointment APPLY 1 A SMALL AMOUNT TO AFFECTED AREA TWICE A DAY (Patient taking differently: Apply a small amount to affected area twice daily as needed for rash) 80 g 2  . warfarin (COUMADIN) 5 MG tablet Take 1 tablet (5 mg total) by mouth daily. (Patient taking differently: Take 2.5-5 mg by mouth daily. Take 2.5 mg by mouth on Sunday, Monday, Wednesday,Friday and Saturday . Take 5 mg by mouth on all other days.) 30 tablet 6  . XOPENEX HFA 45 MCG/ACT inhaler INHALE 2 PUFFS INTO THE LUNGS EVERY 4 (FOUR) HOURS AS NEEDED FOR WHEEZING. 15 Inhaler 2     . aspirin EC  81 mg Oral Daily  . dextromethorphan-guaiFENesin  1 tablet Oral BID  . diltiazem  180 mg Oral Daily  . docusate sodium  100 mg Oral BID  . folic acid  1 mg Oral Daily  . insulin aspart  0-9 Units Subcutaneous TID WC  . levalbuterol  0.63 mg Nebulization Q6H  . levofloxacin (LEVAQUIN) IV  500 mg Intravenous Q24H  . methylPREDNISolone (SOLU-MEDROL) injection  40 mg Intravenous BID  . mometasone-formoterol  2 puff Inhalation BID  .  multivitamin with minerals  1 tablet Oral Daily  . pantoprazole  40 mg  Oral Daily  . rosuvastatin  10 mg Oral q morning - 10a  . sodium chloride  3 mL Intravenous Q12H  . thiamine  100 mg Oral Daily   Or  . thiamine  100 mg Intravenous Daily  . tiotropium  1 capsule Inhalation Daily  . cyanocobalamin  2,000 mcg Oral Daily    Infusions:    Allergies  Allergen Reactions  . Xarelto [Rivaroxaban] Other (See Comments)    makes pt crazy  . Ticlopidine Hcl Swelling    History   Social History  . Marital Status: Widowed    Spouse Name: N/A  . Number of Children: N/A  . Years of Education: N/A   Occupational History  . retired from [employer] and [employer]  . Smoking status: Former Smoker -- 3.00 packs/day for 50 years    Types: Cigarettes    Quit date: 09/25/2009  . Smokeless tobacco: Never Used  . Alcohol Use: 3.0 oz/week    6 Standard drinks or equivalent per week     Comment: Bourbon 2-3 a night  . Drug Use: No  . Sexual Activity: Not on file   Other Topics Concern  . Not on file   Social History Narrative   Widower in 2009. 3 kids. 4 grandkids. No greatgrandkids. No pets.    Lives with Sharyl Nimrod, brother in law.    Drives [name] to the store who shops for groceries. Prepares all of own food together.    [employer] independent. Son Kenaz helps with finances.       Advance-does not want resuscitation.    HCPOA-no      Retired from [employer] and [employer].       Hobbies: former Air cabin crew, used to fish, tv-old westerns, reading    Family History  Problem Relation Age of Onset  . Arthritis Mother     rheumatoid  . Heart attack Father   . Heart disease Father   . Heart disease Brother   . Colon cancer Brother 35  . Stomach cancer Neg Hx   . Heart disease Brother     PHYSICAL EXAM: Filed Vitals:   05/10/14 1235  BP: 138/85  Pulse: 105  Temp: 97.8 F (36.6 C)  Resp: 28     Intake/Output Summary (Last 24 hours) at 05/10/14 1336 Last data  filed at 05/10/14 1119  Gross per 24 hour  Intake    100 ml  Output    275 ml  Net   -175 ml    General:  Chronically ill appearing. Tachypneic using accessory muscles to breathe HEENT: normal Neck: supple. JVP 6-7. Carotids 2+ bilat; no bruits. No lymphadenopathy or thryomegaly appreciated. Cor: Barrel chested PMI non palpable. Distant HS. Irregular tachy.  Lungs: Poor air movement throughout coarse Abdomen: soft, nontender, +distended. No hepatosplenomegaly. No bruits or masses. Good bowel sounds. Extremities: no cyanosis, clubbing, rash, edema Neuro: alert & oriented x 3, cranial nerves grossly intact. moves all 4 extremities w/o difficulty. Affect pleasant.  ECG: AF 121 with PVC. No ST-T wave abnormalities.    Results for orders placed or performed during the hospital encounter of 05/10/14 (from the past 24 hour(s))  Troponin I     Status: None   Collection Time: 05/10/14  7:18 AM  Result Value Ref Range   Troponin I <0.03 <0.031 ng/mL  CBC with Differential/Platelet     Status: Abnormal   Collection Time: 05/10/14  7:18 AM  Result Value Ref Range  WBC 10.1 4.0 - 10.5 K/uL   RBC 4.42 4.22 - 5.81 MIL/uL   Hemoglobin 15.1 13.0 - 17.0 g/dL   HCT 45.4 39.0 - 52.0 %   MCV 102.7 (H) 78.0 - 100.0 fL   MCH 34.2 (H) 26.0 - 34.0 pg   MCHC 33.3 30.0 - 36.0 g/dL   RDW 14.0 11.5 - 15.5 %   Platelets 146 (L) 150 - 400 K/uL   Neutrophils Relative % 84 (H) 43 - 77 %   Neutro Abs 8.4 (H) 1.7 - 7.7 K/uL   Lymphocytes Relative 6 (L) 12 - 46 %   Lymphs Abs 0.6 (L) 0.7 - 4.0 K/uL   Monocytes Relative 10 3 - 12 %   Monocytes Absolute 1.0 0.1 - 1.0 K/uL   Eosinophils Relative 0 0 - 5 %   Eosinophils Absolute 0.0 0.0 - 0.7 K/uL   Basophils Relative 0 0 - 1 %   Basophils Absolute 0.0 0.0 - 0.1 K/uL  Comprehensive metabolic panel     Status: Abnormal   Collection Time: 05/10/14  7:18 AM  Result Value Ref Range   Sodium 135 135 - 145 mmol/L   Potassium 4.1 3.5 - 5.1 mmol/L   Chloride 97  96 - 112 mmol/L   CO2 32 19 - 32 mmol/L   Glucose, Bld 185 (H) 70 - 99 mg/dL   BUN 7 6 - 23 mg/dL   Creatinine, Ser 0.91 0.50 - 1.35 mg/dL   Calcium 9.0 8.4 - 10.5 mg/dL   Total Protein 6.8 6.0 - 8.3 g/dL   Albumin 3.6 3.5 - 5.2 g/dL   AST 22 0 - 37 U/L   ALT 15 0 - 53 U/L   Alkaline Phosphatase 44 39 - 117 U/L   Total Bilirubin 1.5 (H) 0.3 - 1.2 mg/dL   GFR calc non Af Amer 81 (L) >90 mL/min   GFR calc Af Amer >90 >90 mL/min   Anion gap 6 5 - 15  Protime-INR     Status: Abnormal   Collection Time: 05/10/14  7:18 AM  Result Value Ref Range   Prothrombin Time 24.4 (H) 11.6 - 15.2 seconds   INR 2.17 (H) 0.00 - 1.49  Brain natriuretic peptide     Status: Abnormal   Collection Time: 05/10/14  7:19 AM  Result Value Ref Range   B Natriuretic Peptide 190.0 (H) 0.0 - 100.0 pg/mL  I-stat troponin, ED     Status: None   Collection Time: 05/10/14  7:22 AM  Result Value Ref Range   Troponin i, poc 0.01 0.00 - 0.08 ng/mL   Comment 3          I-stat chem 8, ed     Status: Abnormal   Collection Time: 05/10/14  7:24 AM  Result Value Ref Range   Sodium 137 135 - 145 mmol/L   Potassium 4.1 3.5 - 5.1 mmol/L   Chloride 97 96 - 112 mmol/L   BUN 10 6 - 23 mg/dL   Creatinine, Ser 0.70 0.50 - 1.35 mg/dL   Glucose, Bld 185 (H) 70 - 99 mg/dL   Calcium, Ion 1.10 (L) 1.13 - 1.30 mmol/L   TCO2 26 0 - 100 mmol/L   Hemoglobin 17.0 13.0 - 17.0 g/dL   HCT 50.0 39.0 - 52.0 %  I-Stat CG4 Lactic Acid, ED     Status: None   Collection Time: 05/10/14  7:24 AM  Result Value Ref Range   Lactic Acid, Venous 1.70 0.5 - 2.0  mmol/L  I-Stat Venous Blood Gas, ED (order at [facility] and [facility] only)     Status: Abnormal   Collection Time: 05/10/14  8:37 AM  Result Value Ref Range   pH, Ven 7.355 (H) 7.250 - 7.300   pCO2, Ven 56.9 (H) 45.0 - 50.0 mmHg   pO2, Ven 33.0 30.0 - 45.0 mmHg   Bicarbonate 31.8 (H) 20.0 - 24.0 mEq/L   TCO2 34 0 - 100 mmol/L   O2 Saturation 60.0 %   Acid-Base Excess 4.0 (H) 0.0 - 2.0 mmol/L    Sample type VENOUS    Comment NOTIFIED PHYSICIAN   I-Stat CG4 Lactic Acid, ED     Status: Abnormal   Collection Time: 05/10/14 10:16 AM  Result Value Ref Range   Lactic Acid, Venous 2.40 (HH) 0.5 - 2.0 mmol/L   Comment NOTIFIED PHYSICIAN   Urinalysis, Routine w reflex microscopic     Status: Abnormal   Collection Time: 05/10/14 11:20 AM  Result Value Ref Range   Color, Urine YELLOW YELLOW   APPearance CLEAR CLEAR   Specific Gravity, Urine 1.015 1.005 - 1.030   pH 5.5 5.0 - 8.0   Glucose, UA 100 (A) NEGATIVE mg/dL   Hgb urine dipstick NEGATIVE NEGATIVE   Bilirubin Urine NEGATIVE NEGATIVE   Ketones, ur NEGATIVE NEGATIVE mg/dL   Protein, ur NEGATIVE NEGATIVE mg/dL   Urobilinogen, UA 0.2 0.0 - 1.0 mg/dL   Nitrite NEGATIVE NEGATIVE   Leukocytes, UA NEGATIVE NEGATIVE   Dg Chest Portable 1 View  05/10/2014   CLINICAL DATA:  Shortness of breath  EXAM: PORTABLE CHEST - 1 VIEW  COMPARISON:  04/03/2014  FINDINGS: Multiple monitoring leads overlie the patient. Stable cardiac and mediastinal contours status post median sternotomy. No consolidative pulmonary opacities. No pleural effusion or pneumothorax. Regional skeleton is unremarkable.  IMPRESSION: No acute cardiopulmonary process.   Electronically Signed   By: Lovey Newcomer M.D.   On: 05/10/2014 08:06     ASSESSMENT/Plan:  1. PAF with RVR    --CHADSVASC = 4. S/p DC-CV 1/20.     --Likely triggered by COPD flare. Rate is only mildly elevated but he states he is clearly more symptomatic when in AF. Thus, likely needs rhythm control strategy. Unfortunately cannot afford Tikosyn. Will start amiodaroen carefully and watch respiratory status. INR is therapeutic and can repeat DC-CV when respiratory status more stable, if needed. Continue coumadin per pharmacy.   2. Acute on chronic respiratory failure     --Has end stage COPD. Management per primary team  3. CAD s/p CABG    -stable. No ischemia  4. Chronic diastolic HF   -volume status looks  ok. Can diurese as needed.  5. Heavy snoring - likely has OSA. Consider outpatient sleep study  6. DNR  Quillian Quince Karolina Zamor,MD 1:40 PM

## 2014-05-10 NOTE — ED Notes (Signed)
Pt aware of urine sample needed. Pt unable to go at this time. RN notified.

## 2014-05-10 NOTE — ED Notes (Signed)
Dr. Rancour at bedside. 

## 2014-05-10 NOTE — ED Notes (Signed)
Respiratory at bedside.

## 2014-05-10 NOTE — ED Provider Notes (Signed)
CSN: [id number]     Arrival date & time 05/10/14  2637 History   First MD Initiated Contact with Patient 05/10/14 0700     Chief Complaint  Patient presents with  . Shortness of Breath     (Consider location/radiation/quality/duration/timing/severity/associated sxs/prior Treatment) HPI Comments: Asthma EMS with respiratory distress. Reports trouble breathing since 11 PM last night. EMS placed him on BiPAP and gave him a nebulizer. Denies any chest pain. Denies any fever. Has a nonproductive cough. Patient with history of COPD but does not wear oxygen at home. History of CHF with normal ejection fraction. Reports compliance with medications. Reports cardioversion for atrial fibrillation last month. He is on Coumadin. He is in atrial fibrillation currently denies any leg pain or leg swelling. No abdominal pain. No nausea or vomiting. He does not wish to be intubated and does not want CPR.  The history is provided by the patient and the EMS personnel. The history is limited by the condition of the patient.    Past Medical History  Diagnosis Date  . HYPERLIPIDEMIA 11/21/2006  . HYPERTENSION 11/21/2006  . MYOCARDIAL INFARCTION, HX OF 11/21/2006  . CORONARY ARTERY DISEASE 11/21/2006  . OSTEOPOROSIS 11/21/2006  . COLONIC POLYPS 05/21/2007  . COPD 05/21/2007  . HIATAL HERNIA 05/21/2007  . DIVERTICULAR DISEASE 05/21/2007  . NEPHROLITHIASIS 05/21/2007  . GI BLEEDING 09/09/2008  . ANEMIA, B12 DEFICIENCY 12/02/2008  . CAROTID ARTERY STENOSIS 04/16/2009  . PERIPHERAL VASCULAR DISEASE 09/02/2009  . Atrial fibrillation 11/06/2009  . Allergic rhinitis   . Osteoporosis   . Hemoptysis   . Internal hemorrhoids   . GERD (gastroesophageal reflux disease)   . Cataract   . Diabetes mellitus without complication    Past Surgical History  Procedure Laterality Date  . Thyroidectomy      partial  . Coronary stent placement      03/07/2001  . Cholecystectomy    . Appendectomy    . Hemorrhoid surgery    .  Inguinal hernia repair  10/10/08  . Coronary artery bypass graft  2011  . Ptca    . Cardioversion N/A 04/16/2014    Procedure: CARDIOVERSION;  Surgeon: Josue Hector, MD;  Location: [facility] ENDOSCOPY;  Service: Cardiovascular;  Laterality: N/A;   Family History  Problem Relation Age of Onset  . Arthritis Mother     rheumatoid  . Heart attack Father   . Heart disease Father   . Heart disease Brother   . Colon cancer Brother 31  . Stomach cancer Neg Hx   . Heart disease Brother    History  Substance Use Topics  . Smoking status: Former Smoker -- 3.00 packs/day for 50 years    Types: Cigarettes    Quit date: 09/25/2009  . Smokeless tobacco: Never Used  . Alcohol Use: 3.0 oz/week    6 Standard drinks or equivalent per week     Comment: Bourbon 2-3 a night    Review of Systems  Constitutional: Negative for fever, activity change and appetite change.  HENT: Negative for rhinorrhea.   Eyes: Negative for visual disturbance.  Respiratory: Positive for cough, chest tightness and shortness of breath.   Cardiovascular: Negative for chest pain and leg swelling.  Gastrointestinal: Negative for nausea, vomiting and abdominal pain.  Genitourinary: Negative for dysuria and hematuria.  Musculoskeletal: Negative for myalgias and arthralgias.  Skin: Negative for rash.  Neurological: Negative for dizziness, weakness and headaches.  A complete 10 system review of systems was obtained and all systems are  negative except as noted in the HPI and PMH.      Allergies  Xarelto and Ticlopidine hcl  Home Medications   Prior to Admission medications   Medication Sig Start Date End Date Taking? Authorizing Provider  Calcium Carbonate-Vit D-Min (CALTRATE 600+D PLUS) 600-400 MG-UNIT per tablet Chew 1 tablet by mouth daily.     Yes Historical Provider, MD  cetirizine (ZYRTEC ALLERGY) 10 MG tablet Take 1 tablet (10 mg total) by mouth every morning. 08/17/10  Yes Tammy S Parrett, NP  CRESTOR 20 MG tablet  TAKE 1/2 TABLET BY MOUTH EVERY MORNING Patient taking differently: Take 10 mg by mouth every morning 04/14/14  Yes Marin Olp, MD  cyanocobalamin ([employer] VITAMIN B12) 2000 MCG tablet Take 1 tablet (2,000 mcg total) by mouth daily. 04/02/12  Yes Lisabeth Pick, MD  dextromethorphan-guaiFENesin (MUCINEX DM) 30-600 MG per 12 hr tablet Take 1 tablet by mouth every 12 (twelve) hours as needed. cough    Yes Historical Provider, MD  diltiazem (CARDIZEM CD) 180 MG 24 hr capsule Take 1 capsule (180 mg total) by mouth daily. 03/17/14  Yes Burtis Junes, NP  DULERA 100-5 MCG/ACT AERO INHALE 2 PUFFS INTO THE LUNGS TWICE A DAY Patient taking differently: Inhale 2 puffs into the lungs twice daily 11/28/12  Yes Bruce Kendall Flack, MD  furosemide (LASIX) 40 MG tablet Take 1 tablet (40 mg total) by mouth daily. 03/17/14  Yes Burtis Junes, NP  pantoprazole (PROTONIX) 40 MG tablet TAKE 1 TABLET BY MOUTH TWICE A DAY Patient taking differently: Take 40 mg by mouth once daily   Yes Bruce Kendall Flack, MD  potassium chloride SA (K-DUR,KLOR-CON) 20 MEQ tablet Take 1 tablet (20 mEq total) by mouth daily. 03/17/14  Yes Burtis Junes, NP  SPIRIVA HANDIHALER 18 MCG inhalation capsule INHALE CONTENTS OF 1 CAPSULE DAILY 01/24/14  Yes Bruce Kendall Flack, MD  triamcinolone (KENALOG) 0.025 % ointment APPLY 1 A SMALL AMOUNT TO AFFECTED AREA TWICE A DAY Patient taking differently: Apply a small amount to affected area twice daily as needed for rash 09/10/10  Yes Bruce Kendall Flack, MD  warfarin (COUMADIN) 5 MG tablet Take 1 tablet (5 mg total) by mouth daily. Patient taking differently: Take 2.5-5 mg by mouth daily. Take 2.5 mg by mouth on Sunday, Monday, Wednesday,Friday and Saturday . Take 5 mg by mouth on all other days. 03/05/14  Yes Michele M Lenze, PA-C  XOPENEX HFA 45 MCG/ACT inhaler INHALE 2 PUFFS INTO THE LUNGS EVERY 4 (FOUR) HOURS AS NEEDED FOR WHEEZING. 05/08/14  Yes Marin Olp, MD   BP 129/76 mmHg  Pulse 106  Temp(Src) 98.7 F  (37.1 C) (Axillary)  Resp 24  Ht 5\' 5"  (1.651 m)  Wt 146 lb (66.225 kg)  BMI 24.30 kg/m2  SpO2 96% Physical Exam  Constitutional: He is oriented to person, place, and time. He appears well-developed and well-nourished. He appears distressed.  Dyspneic on C Pap  HENT:  Head: Normocephalic and atraumatic.  Mouth/Throat: Oropharynx is clear and moist. No oropharyngeal exudate.  Eyes: Conjunctivae and EOM are normal. Pupils are equal, round, and reactive to light.  Neck: Normal range of motion. Neck supple.  No meningismus.  Cardiovascular: Normal rate, normal heart sounds and intact distal pulses.   No murmur heard. Irregular tachycardia  Pulmonary/Chest: He is in respiratory distress. He has wheezes.  Increased work of breathing with expiratory wheezing and diminished breath sounds at the bases  Abdominal: Soft. There is no  tenderness. There is no rebound and no guarding.  Musculoskeletal: Normal range of motion. He exhibits no edema or tenderness.  Neurological: He is alert and oriented to person, place, and time. No cranial nerve deficit. He exhibits normal muscle tone. Coordination normal.  No ataxia on finger to nose bilaterally. No pronator drift. 5/5 strength throughout. CN 2-12 intact. Negative Romberg. Equal grip strength. Sensation intact. Gait is normal.   Skin: Skin is warm.  Psychiatric: He has a normal mood and affect. His behavior is normal.  Nursing note and vitals reviewed.   ED Course  Procedures (including critical care time) Labs Review Labs Reviewed  BRAIN NATRIURETIC PEPTIDE - Abnormal; Notable for the following:    B Natriuretic Peptide 190.0 (*)    All other components within normal limits  CBC WITH DIFFERENTIAL/PLATELET - Abnormal; Notable for the following:    MCV 102.7 (*)    MCH 34.2 (*)    Platelets 146 (*)    Neutrophils Relative % 84 (*)    Neutro Abs 8.4 (*)    Lymphocytes Relative 6 (*)    Lymphs Abs 0.6 (*)    All other components within  normal limits  COMPREHENSIVE METABOLIC PANEL - Abnormal; Notable for the following:    Glucose, Bld 185 (*)    Total Bilirubin 1.5 (*)    GFR calc non Af Amer 81 (*)    All other components within normal limits  PROTIME-INR - Abnormal; Notable for the following:    Prothrombin Time 24.4 (*)    INR 2.17 (*)    All other components within normal limits  I-STAT CHEM 8, ED - Abnormal; Notable for the following:    Glucose, Bld 185 (*)    Calcium, Ion 1.10 (*)    All other components within normal limits  I-STAT VENOUS BLOOD GAS, ED - Abnormal; Notable for the following:    pH, Ven 7.355 (*)    pCO2, Ven 56.9 (*)    Bicarbonate 31.8 (*)    Acid-Base Excess 4.0 (*)    All other components within normal limits  TROPONIN I  URINALYSIS, ROUTINE W REFLEX MICROSCOPIC  I-STAT CG4 LACTIC ACID, ED  I-STAT TROPOININ, ED  I-STAT CG4 LACTIC ACID, ED  I-STAT ARTERIAL BLOOD GAS, ED    Imaging Review Dg Chest Portable 1 View  05/10/2014   CLINICAL DATA:  Shortness of breath  EXAM: PORTABLE CHEST - 1 VIEW  COMPARISON:  04/03/2014  FINDINGS: Multiple monitoring leads overlie the patient. Stable cardiac and mediastinal contours status post median sternotomy. No consolidative pulmonary opacities. No pleural effusion or pneumothorax. Regional skeleton is unremarkable.  IMPRESSION: No acute cardiopulmonary process.   Electronically Signed   By: Lovey Newcomer M.D.   On: 05/10/2014 08:06     EKG Interpretation   Date/Time:  Saturday May 10 2014 06:54:40 EST Ventricular Rate:  121 PR Interval:    QRS Duration: 96 QT Interval:  339 QTC Calculation: 481 R Axis:   26 Text Interpretation:  Atrial fibrillation Low voltage, extremity leads  Premature ventricular complexes Confirmed by Glynn Octave [id number])  on 05/10/2014 6:58:52 AM      MDM   Final diagnoses:  Acute respiratory failure with hypercapnia  COPD exacerbation  Atrial fibrillation with RVR  respiratory distress with history  of COPD and CHF. atrial fibrillation with RVR.  Chest x-ray no acute process. No infiltrate. Patient given nebulizers and steroids.  INR therapeutic 2.1  IV Lopressor given for rapid atrial fibrillation with  improvement in heart rate to the 100s. Doubt PE with therapeutic INR. ABG shows hypercarbic respiratory acidosis. Patient maintained on BiPAP and is feeling better. Additional nebulizer given.  Stable for stepdown admission by the hospitalist team. Patient does not want to be intubated. He is DO NOT RESUSCITATE. Suspect COPD exacerbation rather than CHF exacerbation as source of dyspnea.  CRITICAL CARE Performed by: Ezequiel Essex Total critical care time: 40 Critical care time was exclusive of separately billable procedures and treating other patients. Critical care was necessary to treat or prevent imminent or life-threatening deterioration. Critical care was time spent personally by me on the following activities: development of treatment plan with patient and/or surrogate as well as nursing, discussions with consultants, evaluation of patient's response to treatment, examination of patient, obtaining history from patient or surrogate, ordering and performing treatments and interventions, ordering and review of laboratory studies, ordering and review of radiographic studies, pulse oximetry and re-evaluation of patient's condition.   Ezequiel Essex, MD 05/10/14 [phone]

## 2014-05-10 NOTE — ED Notes (Signed)
EMS - Patient coming from home with increases SOB since 11:00 last night.  Patient on C-Pap with EMS 7.5 peak.  10mg  Albuterol and 0.5mg  of Atrovent in route.  Patient was having generalized chest pain but subsided once C-PAP was place with EMS.  Patient is alert and oriented with EMS.  Patient was cardioverted for A-fib last week.

## 2014-05-10 NOTE — Progress Notes (Signed)
INITIAL NUTRITION ASSESSMENT  DOCUMENTATION CODES Per approved criteria  -Non-severe (moderate) malnutrition in the context of chronic illness   INTERVENTION:  Ensure Complete PO BID, each supplement provides 350 kcal and 13 grams of protein  NUTRITION DIAGNOSIS: Malnutrition related to increased energy expenditure with COPD as evidenced by mild depletion of muscle mass and 9% weight loss within the past 2 months.   Goal: Intake to meet >90% of estimated nutrition needs.  Monitor:  PO intake, labs, weight trend.  Reason for Assessment: MD Consult (COPD Gold protocol)  74 y.o. male  Admitting Dx: Acute respiratory distress  ASSESSMENT: Patient admitted on 2/13 with acute respiratory failure in the setting of chronic COPD, diastolic HF, and A fib.  Patient reports ~20 lb weight loss since the beginning of the year. He has been eating okay, but continues to lose weight, likely related to increased WOB causing increased energy expenditure. Patient willing to try Ensure supplements BID between meals to maximize oral intake.  Nutrition Focused Physical Exam:  Subcutaneous Fat:  Orbital Region: WNL Upper Arm Region: WNL Thoracic and Lumbar Region: NA  Muscle:  Temple Region: WNL Clavicle Bone Region: WNL Clavicle and Acromion Bone Region: WNL Scapular Bone Region: NA Dorsal Hand: WNL Patellar Region: mild depletion Anterior Thigh Region: mild depletion Posterior Calf Region: mild depletion  Edema: none  Height: Ht Readings from Last 1 Encounters:  05/10/14 5\' 5"  (1.651 m)    Weight: Wt Readings from Last 1 Encounters:  05/10/14 143 lb 1.3 oz (64.9 kg)    Ideal Body Weight: 61.8 kg  % Ideal Body Weight: 105%  Wt Readings from Last 10 Encounters:  05/10/14 143 lb 1.3 oz (64.9 kg)  04/03/14 149 lb (67.586 kg)  03/19/14 152 lb (68.947 kg)  03/17/14 157 lb 3.2 oz (71.305 kg)  03/10/14 147 lb (66.679 kg)  03/05/14 151 lb 6.4 oz (68.675 kg)  02/18/14 147 lb 1.9  oz (66.733 kg)  02/06/14 150 lb (68.04 kg)  12/11/13 157 lb (71.215 kg)  06/14/13 155 lb (70.308 kg)    Usual Body Weight: 157 lbs 2 months ago  % Usual Body Weight: 91%  BMI:  Body mass index is 23.81 kg/(m^2).  Estimated Nutritional Needs: Kcal: 1650-1850 Protein: 75-90 gm Fluid: 1.8 L  Skin: no issues  Diet Order: Diet Heart  EDUCATION NEEDS: -Education needs addressed   Intake/Output Summary (Last 24 hours) at 05/10/14 1316 Last data filed at 05/10/14 1119  Gross per 24 hour  Intake    100 ml  Output    275 ml  Net   -175 ml    Last BM: unknown   Labs:   Recent Labs Lab 05/10/14 0718 05/10/14 0724  NA 135 137  K 4.1 4.1  CL 97 97  CO2 32  --   BUN 7 10  CREATININE 0.91 0.70  CALCIUM 9.0  --   GLUCOSE 185* 185*    CBG (last 3)  No results for input(s): GLUCAP in the last 72 hours.  Scheduled Meds: . aspirin EC  81 mg Oral Daily  . dextromethorphan-guaiFENesin  1 tablet Oral BID  . diltiazem  180 mg Oral Daily  . docusate sodium  100 mg Oral BID  . folic acid  1 mg Oral Daily  . insulin aspart  0-9 Units Subcutaneous TID WC  . levalbuterol  0.63 mg Nebulization Q6H  . levofloxacin (LEVAQUIN) IV  500 mg Intravenous Q24H  . methylPREDNISolone (SOLU-MEDROL) injection  40 mg Intravenous BID  .  mometasone-formoterol  2 puff Inhalation BID  . multivitamin with minerals  1 tablet Oral Daily  . pantoprazole  40 mg Oral Daily  . rosuvastatin  10 mg Oral q morning - 10a  . sodium chloride  3 mL Intravenous Q12H  . thiamine  100 mg Oral Daily   Or  . thiamine  100 mg Intravenous Daily  . tiotropium  1 capsule Inhalation Daily  . cyanocobalamin  2,000 mcg Oral Daily    Continuous Infusions: . sodium chloride      Past Medical History  Diagnosis Date  . HYPERLIPIDEMIA 11/21/2006  . HYPERTENSION 11/21/2006  . MYOCARDIAL INFARCTION, HX OF 11/21/2006  . CORONARY ARTERY DISEASE 11/21/2006  . OSTEOPOROSIS 11/21/2006  . COLONIC POLYPS 05/21/2007  .  COPD 05/21/2007  . HIATAL HERNIA 05/21/2007  . DIVERTICULAR DISEASE 05/21/2007  . NEPHROLITHIASIS 05/21/2007  . GI BLEEDING 09/09/2008  . ANEMIA, B12 DEFICIENCY 12/02/2008  . CAROTID ARTERY STENOSIS 04/16/2009  . PERIPHERAL VASCULAR DISEASE 09/02/2009  . Atrial fibrillation 11/06/2009  . Allergic rhinitis   . Osteoporosis   . Hemoptysis   . Internal hemorrhoids   . GERD (gastroesophageal reflux disease)   . Cataract   . Diabetes mellitus without complication     Past Surgical History  Procedure Laterality Date  . Thyroidectomy      partial  . Coronary stent placement      03/07/2001  . Cholecystectomy    . Appendectomy    . Hemorrhoid surgery    . Inguinal hernia repair  10/10/08  . Coronary artery bypass graft  2011  . Ptca    . Cardioversion N/A 04/16/2014    Procedure: CARDIOVERSION;  Surgeon: Josue Hector, MD;  Location: [facility] ENDOSCOPY;  Service: Cardiovascular;  Laterality: N/A;    Molli Barrows, [place], [place], [place] Pager [phone] After Hours Pager [phone]

## 2014-05-10 NOTE — ED Notes (Signed)
Hospitalist at bedside with the patient.

## 2014-05-11 ENCOUNTER — Inpatient Hospital Stay (HOSPITAL_COMMUNITY): Payer: Medicare Other

## 2014-05-11 DIAGNOSIS — I10 Essential (primary) hypertension: Secondary | ICD-10-CM

## 2014-05-11 DIAGNOSIS — J441 Chronic obstructive pulmonary disease with (acute) exacerbation: Secondary | ICD-10-CM

## 2014-05-11 DIAGNOSIS — K219 Gastro-esophageal reflux disease without esophagitis: Secondary | ICD-10-CM

## 2014-05-11 DIAGNOSIS — E785 Hyperlipidemia, unspecified: Secondary | ICD-10-CM

## 2014-05-11 LAB — GLUCOSE, CAPILLARY
GLUCOSE-CAPILLARY: 360 mg/dL — AB (ref 70–99)
Glucose-Capillary: 221 mg/dL — ABNORMAL HIGH (ref 70–99)
Glucose-Capillary: 321 mg/dL — ABNORMAL HIGH (ref 70–99)
Glucose-Capillary: 186 mg/dL — ABNORMAL HIGH (ref 70–99)

## 2014-05-11 LAB — CBC
HCT: 45 % (ref 39.0–52.0)
HEMOGLOBIN: 14.6 g/dL (ref 13.0–17.0)
MCH: 34.8 pg — AB (ref 26.0–34.0)
MCHC: 32.4 g/dL (ref 30.0–36.0)
MCV: 107.4 fL — ABNORMAL HIGH (ref 78.0–100.0)
Platelets: 134 10*3/uL — ABNORMAL LOW (ref 150–400)
RBC: 4.19 MIL/uL — ABNORMAL LOW (ref 4.22–5.81)
RDW: 14.1 % (ref 11.5–15.5)
WBC: 8.7 10*3/uL (ref 4.0–10.5)

## 2014-05-11 LAB — BASIC METABOLIC PANEL
Anion gap: 3 — ABNORMAL LOW (ref 5–15)
BUN: 14 mg/dL (ref 6–23)
CALCIUM: 9 mg/dL (ref 8.4–10.5)
CO2: 38 mmol/L — AB (ref 19–32)
Chloride: 97 mmol/L (ref 96–112)
Creatinine, Ser: 0.92 mg/dL (ref 0.50–1.35)
GFR calc non Af Amer: 81 mL/min — ABNORMAL LOW (ref >90)
Glucose, Bld: 222 mg/dL — ABNORMAL HIGH (ref 70–99)
Potassium: 4.1 mmol/L (ref 3.5–5.1)
Sodium: 138 mmol/L (ref 135–145)

## 2014-05-11 LAB — TROPONIN I: Troponin I: <0.03 ng/mL (ref <0.031)

## 2014-05-11 LAB — PROTIME-INR
INR: 1.79 — ABNORMAL HIGH (ref 0.00–1.49)
Prothrombin Time: 21 seconds — ABNORMAL HIGH (ref 11.6–15.2)

## 2014-05-11 MED ORDER — ALBUTEROL SULFATE (2.5 MG/3ML) 0.083% IN NEBU
2.5000 mg | INHALATION_SOLUTION | RESPIRATORY_TRACT | Status: DC | PRN
  Administered 2014-05-11 – 2014-05-12 (×3): 2.5 mg via RESPIRATORY_TRACT
  Filled 2014-05-11 (×3): qty 3

## 2014-05-11 MED ORDER — FUROSEMIDE 10 MG/ML IJ SOLN
20.0000 mg | Freq: Once | INTRAMUSCULAR | Status: AC
  Administered 2014-05-11: 20 mg via INTRAVENOUS
  Filled 2014-05-11: qty 2

## 2014-05-11 MED ORDER — BUDESONIDE 0.25 MG/2ML IN SUSP
0.2500 mg | Freq: Two times a day (BID) | RESPIRATORY_TRACT | Status: DC
  Administered 2014-05-11 – 2014-05-16 (×10): 0.25 mg via RESPIRATORY_TRACT
  Filled 2014-05-11 (×16): qty 2

## 2014-05-11 MED ORDER — LEVOFLOXACIN 500 MG PO TABS
500.0000 mg | ORAL_TABLET | Freq: Every day | ORAL | Status: DC
  Administered 2014-05-12 – 2014-05-15 (×4): 500 mg via ORAL
  Filled 2014-05-11 (×6): qty 1

## 2014-05-11 MED ORDER — IPRATROPIUM BROMIDE 0.02 % IN SOLN
0.5000 mg | Freq: Four times a day (QID) | RESPIRATORY_TRACT | Status: DC
  Administered 2014-05-11 – 2014-05-13 (×10): 0.5 mg via RESPIRATORY_TRACT
  Filled 2014-05-11 (×10): qty 2.5

## 2014-05-11 MED ORDER — GUAIFENESIN ER 600 MG PO TB12
600.0000 mg | ORAL_TABLET | Freq: Two times a day (BID) | ORAL | Status: DC
  Administered 2014-05-11 – 2014-05-16 (×11): 600 mg via ORAL
  Filled 2014-05-11 (×12): qty 1

## 2014-05-11 MED ORDER — IPRATROPIUM BROMIDE 0.02 % IN SOLN
RESPIRATORY_TRACT | Status: AC
  Filled 2014-05-11: qty 2.5

## 2014-05-11 MED ORDER — WARFARIN SODIUM 2.5 MG PO TABS
2.5000 mg | ORAL_TABLET | Freq: Once | ORAL | Status: AC
  Administered 2014-05-11: 2.5 mg via ORAL
  Filled 2014-05-11: qty 1

## 2014-05-11 MED ORDER — METHYLPREDNISOLONE SODIUM SUCC 125 MG IJ SOLR
60.0000 mg | Freq: Three times a day (TID) | INTRAMUSCULAR | Status: DC
  Administered 2014-05-11 – 2014-05-13 (×7): 60 mg via INTRAVENOUS
  Filled 2014-05-11: qty 0.96
  Filled 2014-05-11: qty 2
  Filled 2014-05-11 (×2): qty 0.96
  Filled 2014-05-11: qty 2
  Filled 2014-05-11 (×3): qty 0.96
  Filled 2014-05-11: qty 2

## 2014-05-11 NOTE — Progress Notes (Signed)
PATIENT DETAILS Name: [name] Age: 74 y.o. Sex: male Date of Birth: 15-Mar-1941 Admit Date: 05/10/2014 Admitting Physician Annita Brod, MD [id number], Annie Main, MD  Subjective: Breathing better, heart rate much better.   Assessment/Plan: Principal Problem:   Acute Hypoxic Resp Failure: Secondary to COPD exacerbation. Required BiPAP on admission, now weaned off BiPAP and oxygen. Continue steroids, bronchodilators and empiric levofloxacin.  Active Problems:   COPD with excessive sedation: Improved, continue steroids, scheduled bronchodilators, empiric Levaquin. Moving air, but still with coarse rhonchi all over. Follow clinically.    Atrial fibrillation with rapid ventricular response: Seen by cardiology on admission, he recently underwent ardioversion. Started on amiodarone infusion. On chronic anticoagulation with Coumadin-pharmacy dosing while inpatient. Cardiology plans cardioversion once respiratory issues most stable.    History of CAD-status post CABG: Minimal elevation in troponin, doubt any significance. Continue aspirin, and statin. Poor beta blocker candidate given ongoing bronchospasm.    Dyslipidemia: Continue with statin.    Alcohol dependence: No signs of any withdrawal. Continue with Ativan per CIWA protocol.    GERD (gastroesophageal reflux disease): Continue with PPI    Malnutrition of moderate degree: Nutrition eval  Disposition: Remain inpatient-remain in SDU  Antibiotics:  See below   Anti-infectives    Start     Dose/Rate Route Frequency Ordered Stop   05/10/14 1400  levofloxacin (LEVAQUIN) IVPB 500 mg     500 mg 100 mL/hr over 60 Minutes Intravenous Every 24 hours 05/10/14 1244        DVT Prophylaxis: On coumadin  Code Status: DNR  Family Communication Son at bedside  Procedures:  None  CONSULTS:  cardiology  Time spent 40 minutes-which includes 50% of the time with face-to-face with patient/ family and coordinating  care related to the above assessment and plan.  MEDICATIONS: Scheduled Meds: . amiodarone  150 mg Intravenous Once  . aspirin EC  81 mg Oral Daily  . budesonide (PULMICORT) nebulizer solution  0.25 mg Nebulization BID  . diltiazem  180 mg Oral Daily  . docusate sodium  100 mg Oral BID  . folic acid  1 mg Oral Daily  . furosemide  40 mg Oral Daily  . guaiFENesin  600 mg Oral BID  . insulin aspart  0-9 Units Subcutaneous TID WC  . ipratropium  0.5 mg Nebulization Q6H  . levalbuterol  0.63 mg Nebulization Q6H  . levofloxacin (LEVAQUIN) IV  500 mg Intravenous Q24H  . methylPREDNISolone (SOLU-MEDROL) injection  60 mg Intravenous TID  . multivitamin with minerals  1 tablet Oral Daily  . pantoprazole  40 mg Oral Daily  . pneumococcal 23 valent vaccine  0.5 mL Intramuscular Tomorrow-1000  . rosuvastatin  10 mg Oral q morning - 10a  . sodium chloride  3 mL Intravenous Q12H  . thiamine  100 mg Oral Daily   Or  . thiamine  100 mg Intravenous Daily  . cyanocobalamin  2,000 mcg Oral Daily  . Warfarin - Pharmacist Dosing Inpatient   Does not apply q1800   Continuous Infusions: . amiodarone 30 mg/hr (05/11/14 0620)   PRN Meds:.acetaminophen **OR** acetaminophen, albuterol, alum & mag hydroxide-simeth, LORazepam **OR** LORazepam, ondansetron **OR** ondansetron (ZOFRAN) IV, oxyCODONE    PHYSICAL EXAM: Vital signs in last 24 hours: Filed Vitals:   05/11/14 0845 05/11/14 1140 05/11/14 1221 05/11/14 1231  BP:   132/111   Pulse:   82   Temp:   98.5 F (36.9 C)   TempSrc:  Oral   Resp:   21   Height:      Weight:      SpO2: 94% 99%  95%    Weight change: -1.325 kg (-2 lb 14.7 oz) Filed Weights   05/10/14 0700 05/10/14 1235 05/11/14 0000  Weight: 66.225 kg (146 lb) 64.9 kg (143 lb 1.3 oz) 65.409 kg (144 lb 3.2 oz)   Body mass index is 24 kg/(m^2).   Gen Exam: Awake and alert with clear speech.  Not in acute distress, lying comfortably. Neck: Supple, No JVD.   Chest: Coarse  rhonchi all over CVS: S1 S2 Regular, no murmurs.  Abdomen: soft, BS +, non tender, non distended.  Extremities: no edema, lower extremities warm to touch. Neurologic: Non Focal.   Skin: No Rash.   Wounds: N/A.    Intake/Output from previous day:  Intake/Output Summary (Last 24 hours) at 05/11/14 1239 Last data filed at 05/11/14 0900  Gross per 24 hour  Intake 645.75 ml  Output    800 ml  Net -154.25 ml     LAB RESULTS: CBC  Recent Labs Lab 05/10/14 0718 05/10/14 0724 05/11/14 0258  WBC 10.1  --  8.7  HGB 15.1 17.0 14.6  HCT 45.4 50.0 45.0  PLT 146*  --  134*  MCV 102.7*  --  107.4*  MCH 34.2*  --  34.8*  MCHC 33.3  --  32.4  RDW 14.0  --  14.1  LYMPHSABS 0.6*  --   --   MONOABS 1.0  --   --   EOSABS 0.0  --   --   BASOSABS 0.0  --   --     Chemistries   Recent Labs Lab 05/10/14 0718 05/10/14 0724 05/11/14 0258  NA 135 137 138  K 4.1 4.1 4.1  CL 97 97 97  CO2 32  --  38*  GLUCOSE 185* 185* 222*  BUN 7 10 14   CREATININE 0.91 0.70 0.92  CALCIUM 9.0  --  9.0    CBG:  Recent Labs Lab 05/10/14 1526 05/10/14 2151 05/11/14 0757 05/11/14 1220  GLUCAP 269* 156* 221* 321*    GFR Estimated Creatinine Clearance: 61.3 mL/min (by C-G formula based on Cr of 0.92).  Coagulation profile  Recent Labs Lab 05/08/14 1127 05/10/14 0718 05/11/14 0258  INR 2.2 2.17* 1.79*    Cardiac Enzymes  Recent Labs Lab 05/10/14 1436 05/10/14 2055 05/11/14 0258  TROPONINI 0.04* 0.06* <0.03    Invalid input(s): POCBNP No results for input(s): DDIMER in the last 72 hours. No results for input(s): HGBA1C in the last 72 hours. No results for input(s): CHOL, HDL, LDLCALC, TRIG, CHOLHDL, LDLDIRECT in the last 72 hours.  Recent Labs  05/10/14 1436  TSH 0.674   No results for input(s): VITAMINB12, FOLATE, FERRITIN, TIBC, IRON, RETICCTPCT in the last 72 hours. No results for input(s): LIPASE, AMYLASE in the last 72 hours.  Urine Studies No results for  input(s): UHGB, CRYS in the last 72 hours.  Invalid input(s): UACOL, UAPR, USPG, UPH, UTP, UGL, UKET, UBIL, UNIT, UROB, ULEU, UEPI, UWBC, URBC, UBAC, CAST, UCOM, BILUA  MICROBIOLOGY: Recent Results (from the past 240 hour(s))  MRSA PCR Screening     Status: None   Collection Time: 05/10/14  1:03 PM  Result Value Ref Range Status   MRSA by PCR NEGATIVE NEGATIVE Final    Comment:        The GeneXpert MRSA Assay (FDA approved for NASAL specimens only), is one component  of a comprehensive MRSA colonization surveillance program. It is not intended to diagnose MRSA infection nor to guide or monitor treatment for MRSA infections.     RADIOLOGY STUDIES/RESULTS: Dg Chest Portable 1 View  05/10/2014   CLINICAL DATA:  Shortness of breath  EXAM: PORTABLE CHEST - 1 VIEW  COMPARISON:  04/03/2014  FINDINGS: Multiple monitoring leads overlie the patient. Stable cardiac and mediastinal contours status post median sternotomy. No consolidative pulmonary opacities. No pleural effusion or pneumothorax. Regional skeleton is unremarkable.  IMPRESSION: No acute cardiopulmonary process.   Electronically Signed   By: Lovey Newcomer M.D.   On: 05/10/2014 08:06    Oren Binet, MD  [facility] Pager:[phone]  If 7PM-7AM, please contact night-coverage [email or web address] Password [facility] 05/11/2014, 12:39 PM   LOS: 1 day

## 2014-05-11 NOTE — Progress Notes (Signed)
PROGRESS NOTE  Primary cardiologist:  Burt Knack  Subjective:   [name] is a 74 y.o. male, with a past medical history of CABG, end-stage COPD, ETOH abuse, diabetes, peripheral vascular disease, and atrial fibrillation, who underwent cardioversion on January 20.  He developed recurrent atrial fib - likely due to COPD flare.  Ws started on IV amiodarone.  HR is much better.  He has chronic wheezing but is feeling much better with a slower HR .  Objective:    Vital Signs:   Temp:  [97.6 F (36.4 C)-98 F (36.7 C)] 97.7 F (36.5 C) (02/14 0759) Pulse Rate:  [52-106] 85 (02/14 0833) Resp:  [24-30] 29 (02/14 0759) BP: (111-138)/(72-109) 133/83 mmHg (02/14 0759) SpO2:  [91 %-100 %] 94 % (02/14 0845) Weight:  [143 lb 1.3 oz (64.9 kg)-144 lb 3.2 oz (65.409 kg)] 144 lb 3.2 oz (65.409 kg) (02/14 0000)  Last BM Date: 05/10/14   24-hour weight change: Weight change: -2 lb 14.7 oz (-1.325 kg)  Weight trends: Filed Weights   05/10/14 0700 05/10/14 1235 05/11/14 0000  Weight: 146 lb (66.225 kg) 143 lb 1.3 oz (64.9 kg) 144 lb 3.2 oz (65.409 kg)    Intake/Output:  02/13 0701 - 02/14 0700 In: 695.7 [P.O.:120; I.V.:425.7; IV Piggyback:150] Out: 1075 [Urine:1075] Total I/O In: 50.1 [I.V.:50.1] Out: -    Physical Exam: BP 133/83 mmHg  Pulse 85  Temp(Src) 97.7 F (36.5 C) (Axillary)  Resp 29  Ht 5\' 5"  (1.651 m)  Wt 144 lb 3.2 oz (65.409 kg)  BMI 24.00 kg/m2  SpO2 94%  Wt Readings from Last 3 Encounters:  05/11/14 144 lb 3.2 oz (65.409 kg)  04/03/14 149 lb (67.586 kg)  03/19/14 152 lb (68.947 kg)    General: Vital signs reviewed and noted.   Head: Normocephalic, atraumatic.  Eyes: conjunctivae/corneas clear.  EOM's intact.   Throat: normal  Neck:  normal   Lungs:    Bilateral wheezes   Heart:   Irreg. Irreg.   Abdomen:  Soft, non-tender, non-distended    Extremities: No edema    Neurologic: A&O X3, CN II - XII are grossly intact.   Psych: Normal       Labs: BMET:  Recent Labs  05/10/14 0718 05/10/14 0724 05/11/14 0258  NA 135 137 138  K 4.1 4.1 4.1  CL 97 97 97  CO2 32  --  38*  GLUCOSE 185* 185* 222*  BUN 7 10 14   CREATININE 0.91 0.70 0.92  CALCIUM 9.0  --  9.0    Liver function tests:  Recent Labs  05/10/14 0718  AST 22  ALT 15  ALKPHOS 44  BILITOT 1.5*  PROT 6.8  ALBUMIN 3.6   No results for input(s): LIPASE, AMYLASE in the last 72 hours.  CBC:  Recent Labs  05/10/14 0718 05/10/14 0724 05/11/14 0258  WBC 10.1  --  8.7  NEUTROABS 8.4*  --   --   HGB 15.1 17.0 14.6  HCT 45.4 50.0 45.0  MCV 102.7*  --  107.4*  PLT 146*  --  134*    Cardiac Enzymes:  Recent Labs  05/10/14 0718 05/10/14 1436 05/10/14 2055 05/11/14 0258  TROPONINI <0.03 0.04* 0.06* <0.03    Coagulation Studies:  Recent Labs  05/08/14 1127 05/10/14 0718 05/11/14 0258  LABPROT  --  24.4* 21.0*  INR 2.2 2.17* 1.79*    Other: Invalid input(s): POCBNP No results for input(s): DDIMER in the last 72 hours. No  results for input(s): HGBA1C in the last 72 hours. No results for input(s): CHOL, HDL, LDLCALC, TRIG, CHOLHDL in the last 72 hours.  Recent Labs  05/10/14 1436  TSH 0.674   No results for input(s): VITAMINB12, FOLATE, FERRITIN, TIBC, IRON, RETICCTPCT in the last 72 hours.   Other results:  Tele   ( personally reviewed )  - atrial fib , HR 80-90   Medications:    Infusions: . amiodarone 30 mg/hr (05/11/14 0620)    Scheduled Medications: . amiodarone  150 mg Intravenous Once  . aspirin EC  81 mg Oral Daily  . budesonide (PULMICORT) nebulizer solution  0.25 mg Nebulization BID  . diltiazem  180 mg Oral Daily  . docusate sodium  100 mg Oral BID  . folic acid  1 mg Oral Daily  . furosemide  40 mg Oral Daily  . guaiFENesin  600 mg Oral BID  . insulin aspart  0-9 Units Subcutaneous TID WC  . ipratropium  0.5 mg Nebulization Q6H  . levalbuterol  0.63 mg Nebulization Q6H  . levofloxacin (LEVAQUIN) IV   500 mg Intravenous Q24H  . methylPREDNISolone (SOLU-MEDROL) injection  60 mg Intravenous TID  . multivitamin with minerals  1 tablet Oral Daily  . pantoprazole  40 mg Oral Daily  . pneumococcal 23 valent vaccine  0.5 mL Intramuscular Tomorrow-1000  . rosuvastatin  10 mg Oral q morning - 10a  . sodium chloride  3 mL Intravenous Q12H  . thiamine  100 mg Oral Daily   Or  . thiamine  100 mg Intravenous Daily  . cyanocobalamin  2,000 mcg Oral Daily  . Warfarin - Pharmacist Dosing Inpatient   Does not apply q1800    Assessment/ Plan:   Principal Problem:   Acute respiratory distress Active Problems:   Hyperlipidemia   Essential hypertension   CAD with history MI   COPD GOLD III   GERD (gastroesophageal reflux disease)   Respiratory failure with hypercapnia   Atrial fibrillation with RVR   Alcohol dependence   Respiratory distress, acute   Malnutrition of moderate degree  1. Atrial fib:  He is back in Afib . Likely due to the COPD exacerbation.  He has tight wheezes today . He seems to be tolerating the A-fib fairly well and if he continues to tolerate it, I would favor continued loading of the amiodarone and re-attempt cardioversion in 1 month.  The Amiodarone load would increase the chances of him remaining in NSR. On the other hand, if he clearly does not tolerate the Afib, we can consider cardioversion soon.   He may need a TEE prior to [place] since he is subtheraputic.   2. Essential HTN:  BP is ok.  Continue current meds.  Diltiazem   3. Hyperlipidemia:  Continue crestor   4. COPD : plan per Int. Med.   Disposition:  Length of Stay: 1  Thayer Headings, [name]., MD, [facility] 05/11/2014, 9:56 AM Office [phone] Pager [phone]

## 2014-05-11 NOTE — Progress Notes (Signed)
Utilization Review Completed.   Yasmin Dibello, RN, BSN Nurse Case Manager  

## 2014-05-11 NOTE — Progress Notes (Signed)
ANTICOAGULATION CONSULT NOTE - Follow Up Consult  Pharmacy Consult for Coumadin Indication: atrial fibrillation  Allergies  Allergen Reactions  . Xarelto [Rivaroxaban] Other (See Comments)    makes pt crazy  . Ticlopidine Hcl Swelling    Patient Measurements: Height: 5\' 5"  (165.1 cm) Weight: 144 lb 3.2 oz (65.409 kg) IBW/kg (Calculated) : 61.5  Vital Signs: Temp: 98.5 F (36.9 C) (02/14 1221) Temp Source: Oral (02/14 1221) BP: 132/111 mmHg (02/14 1221) Pulse Rate: 82 (02/14 1221)  Labs:  Recent Labs  05/10/14 0718 05/10/14 0724 05/10/14 1436 05/10/14 2055 05/11/14 0258  HGB 15.1 17.0  --   --  14.6  HCT 45.4 50.0  --   --  45.0  PLT 146*  --   --   --  134*  LABPROT 24.4*  --   --   --  21.0*  INR 2.17*  --   --   --  1.79*  CREATININE 0.91 0.70  --   --  0.92  TROPONINI <0.03  --  0.04* 0.06* <0.03    Estimated Creatinine Clearance: 61.3 mL/min (by C-G formula based on Cr of 0.92).   Medications:  Prescriptions prior to admission  Medication Sig Dispense Refill Last Dose  . Calcium Carbonate-Vit D-Min (CALTRATE 600+D PLUS) 600-400 MG-UNIT per tablet Chew 1 tablet by mouth daily.     05/09/2014 at Unknown time  . cetirizine (ZYRTEC ALLERGY) 10 MG tablet Take 1 tablet (10 mg total) by mouth every morning.   05/09/2014 at Unknown time  . CRESTOR 20 MG tablet TAKE 1/2 TABLET BY MOUTH EVERY MORNING (Patient taking differently: Take 10 mg by mouth every morning) 30 tablet 2 05/09/2014 at Unknown time  . cyanocobalamin ([employer] VITAMIN B12) 2000 MCG tablet Take 1 tablet (2,000 mcg total) by mouth daily.   05/09/2014 at Unknown time  . dextromethorphan-guaiFENesin (MUCINEX DM) 30-600 MG per 12 hr tablet Take 1 tablet by mouth every 12 (twelve) hours as needed. cough    05/09/2014 at Unknown time  . diltiazem (CARDIZEM CD) 180 MG 24 hr capsule Take 1 capsule (180 mg total) by mouth daily. 90 capsule 3 05/09/2014 at Unknown time  . DULERA 100-5 MCG/ACT AERO INHALE 2 PUFFS INTO THE  LUNGS TWICE A DAY (Patient taking differently: Inhale 2 puffs into the lungs twice daily) 13 g 2 05/09/2014 at Unknown time  . furosemide (LASIX) 40 MG tablet Take 1 tablet (40 mg total) by mouth daily. 30 tablet 3 05/09/2014 at Unknown time  . pantoprazole (PROTONIX) 40 MG tablet TAKE 1 TABLET BY MOUTH TWICE A DAY (Patient taking differently: Take 40 mg by mouth once daily) 180 tablet 1 05/09/2014 at Unknown time  . potassium chloride SA (K-DUR,KLOR-CON) 20 MEQ tablet Take 1 tablet (20 mEq total) by mouth daily. 30 tablet 3 05/09/2014 at Unknown time  . SPIRIVA HANDIHALER 18 MCG inhalation capsule INHALE CONTENTS OF 1 CAPSULE DAILY 30 capsule 0 05/09/2014 at Unknown time  . triamcinolone (KENALOG) 0.025 % ointment APPLY 1 A SMALL AMOUNT TO AFFECTED AREA TWICE A DAY (Patient taking differently: Apply a small amount to affected area twice daily as needed for rash) 80 g 2 05/09/2014 at Unknown time  . warfarin (COUMADIN) 5 MG tablet Take 1 tablet (5 mg total) by mouth daily. (Patient taking differently: Take 2.5-5 mg by mouth daily. Take 2.5 mg by mouth on Sunday, Monday, Wednesday,Friday and Saturday . Take 5 mg by mouth on all other days.) 30 tablet 6 05/09/2014 at Unknown  time  . XOPENEX HFA 45 MCG/ACT inhaler INHALE 2 PUFFS INTO THE LUNGS EVERY 4 (FOUR) HOURS AS NEEDED FOR WHEEZING. 15 Inhaler 2 05/09/2014 at Unknown time    Assessment: 74 y/o male with end stage COPD who presented to the ED with increased SOB and found to be in Afib with RVR. Of note, patient s/p DCCV on 1/20. He takes chronic warfarin for Afib. Starting amiodarone for rhythm control with known interaction with warfarin - will have to watch INR, expect to decrease weekly dose by ~50% when amiodarone starts working. Also on Levaquin for COPD exacerbation which can potentiate INR. INR trended down to 1.79 today. Will closely monitor INR as we should see an increase with Levaquin and amiodarone being started. No bleeding noted, H/H are normal,  platelets are low at 134.  PTA: 2.5 mg/d x 5 mg TTh (22.5 mg/wk)  Goal of Therapy:  INR 2-3 Monitor platelets by anticoagulation protocol: Yes   Plan:  Give coumadin 2.5mg  PO x 1 Monitor daily INR, CBC, s/s of bleed  Ginna Schuur J 05/11/2014,2:46 PM

## 2014-05-11 NOTE — Evaluation (Signed)
Physical Therapy Evaluation Patient Details Name: [name] MRN: [id number] DOB: 1940-10-26 Today's Date: 05/11/2014   History of Present Illness  Acute respiratory failure in the setting of chronic COPD, diastolic heart failure and atrial fibrillation. Pt reported HR up to 300 with SOB on arrival to ED  Clinical Impression  Pt very pleasant and reports 2/4 dyspnea at rest in bed. Pt with transfer bed to chair as current activity tolerance with 3/4 dyspnea with sats maintained 98% on 3L throughout. Pt tripod breathing in chair. Pt educated for activity, HEP, plan and D/C options with pt reporting family cannot really assist at home and may need to consider SNF if unable to return to supervision level. Pt will benefit from acute therapy to maximize mobility, function, activity tolerance, strength and balance to decrease burden of care.     Follow Up Recommendations SNF;Supervision for mobility/OOB    Equipment Recommendations  Rolling walker with 5" wheels;3in1 (PT) (rollator)    Recommendations for Other Services OT consult     Precautions / Restrictions Precautions Precautions: Fall      Mobility  Bed Mobility Overal bed mobility: Modified Independent             General bed mobility comments: HOB 20 degrees  Transfers Overall transfer level: Needs assistance   Transfers: Sit to/from Stand;Stand Pivot Transfers Sit to Stand: Supervision Stand pivot transfers: Supervision       General transfer comment: cues for breathing and assist for lines, pt unable to tolerate further activitiy due to SOB  Ambulation/Gait                Stairs            Wheelchair Mobility    Modified Rankin (Stroke Patients Only)       Balance                                             Pertinent Vitals/Pain Pain Assessment: No/denies pain  HR 85 sats 99% on 3L    Home Living Family/patient expects to be discharged to:: Private  residence Living Arrangements: Other relatives (brother-in-law) Available Help at Discharge: Family;Available PRN/intermittently Type of Home: House Home Access: Stairs to enter   [employer] of Steps: 4 Home Layout: One level Home Equipment: None      Prior Function Level of Independence: Independent         Comments: pt reports he drives, only walks 517' max in house, holds onto furniture and performs own ADLs, brother-in-law does the cooking     Hand Dominance        Extremity/Trunk Assessment   Upper Extremity Assessment: Generalized weakness           Lower Extremity Assessment: Generalized weakness      Cervical / Trunk Assessment: Kyphotic  Communication   Communication: No difficulties  Cognition Arousal/Alertness: Awake/alert Behavior During Therapy: WFL for tasks assessed/performed Overall Cognitive Status: Within Functional Limits for tasks assessed                      General Comments      Exercises General Exercises - Lower Extremity Long Arc Quad: AROM;Seated;Both;10 reps Hip Flexion/Marching: AROM;Seated;Both;10 reps      Assessment/Plan    PT Assessment Patient needs continued PT services  PT Diagnosis Difficulty walking;Generalized weakness   PT Problem List  Decreased activity tolerance;Decreased mobility;Cardiopulmonary status limiting activity;Decreased balance;Decreased knowledge of use of DME;Decreased strength  PT Treatment Interventions Gait training;DME instruction;Stair training;Functional mobility training;Therapeutic activities;Balance training;Patient/family education   PT Goals (Current goals can be found in the Care Plan section) Acute Rehab PT Goals Patient Stated Goal: return home, likes to fish and hunt but hasn't done so for 2 years PT Goal Formulation: With patient/family Time For Goal Achievement: 05/25/14 Potential to Achieve Goals: Fair    Frequency Min 3X/week   Barriers to discharge  Decreased caregiver support brother-in-law with recent CVA and cannot physically assist pt    Co-evaluation               End of Session Equipment Utilized During Treatment: Oxygen Activity Tolerance: Patient limited by fatigue Patient left: in chair;with call bell/phone within reach;with family/visitor present Nurse Communication: Mobility status         Time: 0810-0828 PT Time Calculation (min) (ACUTE ONLY): 18 min   Charges:   PT Evaluation $Initial PT Evaluation Tier I: 1 Procedure     PT G CodesMelford Aase 05/11/2014, 8:37 AM [place], [place]

## 2014-05-12 DIAGNOSIS — I5033 Acute on chronic diastolic (congestive) heart failure: Secondary | ICD-10-CM

## 2014-05-12 LAB — CBC
HCT: 41.5 % (ref 39.0–52.0)
Hemoglobin: 13.7 g/dL (ref 13.0–17.0)
MCH: 34.4 pg — ABNORMAL HIGH (ref 26.0–34.0)
MCHC: 33 g/dL (ref 30.0–36.0)
MCV: 104.3 fL — ABNORMAL HIGH (ref 78.0–100.0)
PLATELETS: 153 10*3/uL (ref 150–400)
RBC: 3.98 MIL/uL — ABNORMAL LOW (ref 4.22–5.81)
RDW: 14 % (ref 11.5–15.5)
WBC: 10.3 10*3/uL (ref 4.0–10.5)

## 2014-05-12 LAB — BASIC METABOLIC PANEL
ANION GAP: 7 (ref 5–15)
BUN: 25 mg/dL — ABNORMAL HIGH (ref 6–23)
CHLORIDE: 94 mmol/L — AB (ref 96–112)
CO2: 33 mmol/L — ABNORMAL HIGH (ref 19–32)
CREATININE: 1.06 mg/dL (ref 0.50–1.35)
Calcium: 8.8 mg/dL (ref 8.4–10.5)
GFR calc non Af Amer: 67 mL/min — ABNORMAL LOW (ref >90)
GFR, EST AFRICAN AMERICAN: 78 mL/min — AB (ref >90)
Glucose, Bld: 271 mg/dL — ABNORMAL HIGH (ref 70–99)
POTASSIUM: 4 mmol/L (ref 3.5–5.1)
SODIUM: 134 mmol/L — AB (ref 135–145)

## 2014-05-12 LAB — PROTIME-INR
INR: 1.57 — ABNORMAL HIGH (ref 0.00–1.49)
Prothrombin Time: 18.9 seconds — ABNORMAL HIGH (ref 11.6–15.2)

## 2014-05-12 LAB — GLUCOSE, CAPILLARY
GLUCOSE-CAPILLARY: 267 mg/dL — AB (ref 70–99)
GLUCOSE-CAPILLARY: 264 mg/dL — AB (ref 70–99)
Glucose-Capillary: 367 mg/dL — ABNORMAL HIGH (ref 70–99)
Glucose-Capillary: 132 mg/dL — ABNORMAL HIGH (ref 70–99)
Glucose-Capillary: 209 mg/dL — ABNORMAL HIGH (ref 70–99)

## 2014-05-12 LAB — HEMOGLOBIN A1C
HEMOGLOBIN A1C: 7.3 % — AB (ref 4.8–5.6)
Mean Plasma Glucose: 163 mg/dL

## 2014-05-12 MED ORDER — FUROSEMIDE 10 MG/ML IJ SOLN
40.0000 mg | Freq: Once | INTRAMUSCULAR | Status: AC
  Administered 2014-05-12: 40 mg via INTRAVENOUS
  Filled 2014-05-12: qty 4

## 2014-05-12 MED ORDER — FUROSEMIDE 40 MG PO TABS
40.0000 mg | ORAL_TABLET | Freq: Every day | ORAL | Status: DC
  Administered 2014-05-13 – 2014-05-16 (×4): 40 mg via ORAL
  Filled 2014-05-12 (×4): qty 1

## 2014-05-12 MED ORDER — WARFARIN SODIUM 6 MG PO TABS
6.0000 mg | ORAL_TABLET | Freq: Once | ORAL | Status: AC
  Administered 2014-05-12: 6 mg via ORAL
  Filled 2014-05-12: qty 1

## 2014-05-12 MED ORDER — OFF THE BEAT BOOK
Freq: Once | Status: AC
Start: 2014-05-12 — End: 2014-05-12
  Administered 2014-05-12: 11:00:00
  Filled 2014-05-12: qty 1

## 2014-05-12 MED ORDER — CETYLPYRIDINIUM CHLORIDE 0.05 % MT LIQD
7.0000 mL | Freq: Two times a day (BID) | OROMUCOSAL | Status: DC
  Administered 2014-05-12 – 2014-05-16 (×9): 7 mL via OROMUCOSAL

## 2014-05-12 MED ORDER — AMIODARONE HCL 200 MG PO TABS
400.0000 mg | ORAL_TABLET | Freq: Two times a day (BID) | ORAL | Status: DC
  Administered 2014-05-12 – 2014-05-16 (×9): 400 mg via ORAL
  Filled 2014-05-12 (×10): qty 2

## 2014-05-12 MED ORDER — DOCUSATE SODIUM 100 MG PO CAPS
100.0000 mg | ORAL_CAPSULE | Freq: Two times a day (BID) | ORAL | Status: DC | PRN
  Administered 2014-05-12: 100 mg via ORAL

## 2014-05-12 NOTE — Evaluation (Signed)
Occupational Therapy Evaluation Patient Details Name: [name] MRN: [id number] DOB: Dec 05, 1940 Today's Date: 05/12/2014    History of Present Illness Acute respiratory failure in the setting of chronic COPD, diastolic heart failure and atrial fibrillation. Pt reported HR up to 300 with SOB on arrival to ED. PMH: alcohol abuse, PVD, CABG, DM, afib with cardioversion, end stage COPD.   Clinical Impression   Pt was functioning independently in mobility and ADL prior to admission, driving, and able to perform light housekeeping.  He presents with poor activity tolerance, cognitive impairment, and decreased balance interfering with ability to perform self care and ADL transfers.  Pt with report of seeing drones last night that eventually flew out the window and door.  Recommending SNF for rehab, but pt will likely decline. Will follow.    Follow Up Recommendations  SNF;Supervision/Assistance - 24 hour (HHOT if pt refuses)   Equipment Recommendations   (to be assessed)    Recommendations for Other Services       Precautions / Restrictions Precautions Precautions: Fall      Mobility Bed Mobility                  Transfers Overall transfer level: Needs assistance   Transfers: Sit to/from Stand Sit to Stand: Supervision         General transfer comment: cues for purse lip breathing and lines, increased WOB and wheezing with activity    Balance Overall balance assessment: Needs assistance Sitting-balance support: Feet supported Sitting balance-Leahy Scale: Good       Standing balance-Leahy Scale: Fair                              ADL Overall ADL's : Needs assistance/impaired Eating/Feeding: Independent;Sitting   Grooming: Wash/dry hands;Wash/dry face;Sitting;Supervision/safety   Upper Body Bathing: Sitting;Supervision/ safety   Lower Body Bathing: Sit to/from stand;Min guard   Upper Body Dressing : Sitting;Supervision/safety   Lower Body  Dressing: Sit to/from stand;Min guard   Toilet Transfer: Ambulation;Regular Toilet;Minimal assistance   Toileting- Clothing Manipulation and Hygiene: Sit to/from stand;Min guard       Functional mobility during ADLs: Minimal assistance General ADL Comments: walked around bed to chair with one hand held, pt furniture walks at home, high fall risk with multiple lines     Vision     Perception     Praxis      Pertinent Vitals/Pain Pain Assessment: No/denies pain     Hand Dominance Right   Extremity/Trunk Assessment Upper Extremity Assessment Upper Extremity Assessment: Overall WFL for tasks assessed (tremulous)   Lower Extremity Assessment Lower Extremity Assessment: Defer to PT evaluation   Cervical / Trunk Assessment Cervical / Trunk Assessment: Kyphotic   Communication Communication Communication: No difficulties   Cognition Arousal/Alertness: Awake/alert Behavior During Therapy: WFL for tasks assessed/performed Overall Cognitive Status: Impaired/Different from baseline Area of Impairment: Safety/judgement;Awareness;Problem solving;Following commands       Following Commands: Follows one step commands with increased time Safety/Judgement: Decreased awareness of safety;Decreased awareness of deficits Awareness: Emergent Problem Solving: Slow processing;Difficulty sequencing General Comments: Pt reports seeing drones last night. Pt without awareness of lines, some impulsivity.   General Comments       Exercises       Shoulder Instructions      Home Living Family/patient expects to be discharged to:: Private residence Living Arrangements: Other relatives (brother in law) Available Help at Discharge: Family;Available PRN/intermittently (family does not assist  with personal care) Type of Home: House Home Access: Stairs to enter Entrance Stairs-Number of Steps: 4   Home Layout: One level         Bathroom Toilet: Standard     Home Equipment: None           Prior Functioning/Environment Level of Independence: Independent        Comments: pt reports he drives, only walks 297' max in house, holds onto furniture and performs own ADLs, brother-in-law does the cooking    OT Diagnosis: Cognitive deficits;Generalized weakness   OT Problem List: Decreased strength;Decreased activity tolerance;Impaired balance (sitting and/or standing);Decreased cognition;Decreased safety awareness;Decreased knowledge of use of DME or AE;Cardiopulmonary status limiting activity   OT Treatment/Interventions: Self-care/ADL training;DME and/or AE instruction;Therapeutic activities;Balance training;Patient/family education;Energy conservation    OT Goals(Current goals can be found in the care plan section) Acute Rehab OT Goals Patient Stated Goal: return home OT Goal Formulation: With patient Time For Goal Achievement: 05/26/14 Potential to Achieve Goals: Good ADL Goals Pt Will Perform Grooming: with modified independence;standing Pt Will Perform Lower Body Bathing: with modified independence;sit to/from stand Pt Will Perform Lower Body Dressing: with modified independence;sit to/from stand Pt Will Transfer to Toilet: with modified independence;ambulating;regular height toilet Pt Will Perform Toileting - Clothing Manipulation and hygiene: with modified independence;sit to/from stand Additional ADL Goal #1: Pt will generalize breathing techniques and energy conservation strategies in ADL.  OT Frequency: Min 2X/week   Barriers to D/C: Decreased caregiver support          Co-evaluation              End of Session Equipment Utilized During Treatment: Oxygen Nurse Communication:  (ok to give pt OJ and seat him near window)  Activity Tolerance: Patient limited by fatigue Patient left: in chair;with call bell/phone within reach   Time: 1110-1138 OT Time Calculation (min): 28 min Charges:  OT General Charges $OT Visit: 1 Procedure OT  Evaluation $Initial OT Evaluation Tier I: 1 Procedure OT Treatments $Self Care/Home Management : 8-22 mins G-Codes:    Malka So 05/12/2014, 11:59 AM [phone]

## 2014-05-12 NOTE — Progress Notes (Signed)
NUTRITION FOLLOW UP  Intervention:   -Provide yogurt TID between meals  Nutrition Dx:   Malnutrition related to increased energy expenditure with COPD as evidenced by mild depletion of muscle mass and 9% weight loss within the past 2 months; ongoing  Goal:   Intake to meet >90% of estimated nutrition needs; progressing  Monitor:   PO intake, labs, weight trend.  Assessment:   Patient admitted on 2/13 with acute respiratory failure in the setting of chronic COPD, diastolic HF, and A fib.  RD received consult to assess nutritional needs and status. RD last evaluated pt on 05/10/14. Spoke with both pt and pt daughter at bedside. Both confirm that pt has been eating better and appetite is improving. Pt reports he ate 100% of breakfast and more than 75% of his lunch tray. He reports that he tried the Ensure supplements, but does not like them. He reports he would rather receive yogurt between meals in order to get more protein in his diet. RD will order between meal nourishment of yogurt. Noted Ensure supplement was d/c.   He reports he believes he may have lost more weight due to diuretics. Noted 3# wt gain since admission.  Labs reviewed. Na: 134, Cl: 94, CO2: 33, BUN: 25, Glucose: 271. CBGS: 264-360.   Height: Ht Readings from Last 1 Encounters:  05/11/14 5\' 5"  (1.651 m)    Weight Status:   Wt Readings from Last 1 Encounters:  05/12/14 146 lb 2.6 oz (66.3 kg)   05/10/14 143 lb 1.3 oz (64.9 kg)       Re-estimated needs:  Kcal: 1650-1850 Protein: 75-90 grams Fluid: 1.8 L  Skin: Intact  Diet Order: Diet Heart   Intake/Output Summary (Last 24 hours) at 05/12/14 1045 Last data filed at 05/12/14 1031  Gross per 24 hour  Intake  911.9 ml  Output   1090 ml  Net -178.1 ml    Last BM: 05/10/14   Labs:   Recent Labs Lab 05/10/14 0718 05/10/14 0724 05/11/14 0258 05/12/14 0248  NA 135 137 138 134*  K 4.1 4.1 4.1 4.0  CL 97 97 97 94*  CO2 32  --  38* 33*  BUN 7 10  14  25*  CREATININE 0.91 0.70 0.92 1.06  CALCIUM 9.0  --  9.0 8.8  GLUCOSE 185* 185* 222* 271*    CBG (last 3)   Recent Labs  05/11/14 1949 05/11/14 2312 05/12/14 0819  GLUCAP 360* 267* 264*    Scheduled Meds: . amiodarone  400 mg Oral BID  . antiseptic oral rinse  7 mL Mouth Rinse BID  . aspirin EC  81 mg Oral Daily  . budesonide (PULMICORT) nebulizer solution  0.25 mg Nebulization BID  . diltiazem  180 mg Oral Daily  . docusate sodium  100 mg Oral BID  . folic acid  1 mg Oral Daily  . [START ON 05/13/2014] furosemide  40 mg Oral Daily  . guaiFENesin  600 mg Oral BID  . insulin aspart  0-9 Units Subcutaneous TID WC  . ipratropium  0.5 mg Nebulization Q6H  . levalbuterol  0.63 mg Nebulization Q6H  . levofloxacin  500 mg Oral Q1500  . methylPREDNISolone (SOLU-MEDROL) injection  60 mg Intravenous TID  . multivitamin with minerals  1 tablet Oral Daily  . pantoprazole  40 mg Oral Daily  . pneumococcal 23 valent vaccine  0.5 mL Intramuscular Tomorrow-1000  . rosuvastatin  10 mg Oral q morning - 10a  . sodium chloride  3 mL Intravenous Q12H  . thiamine  100 mg Oral Daily   Or  . thiamine  100 mg Intravenous Daily  . cyanocobalamin  2,000 mcg Oral Daily  . Warfarin - Pharmacist Dosing Inpatient   Does not apply q1800    Continuous Infusions:   Ainsley Deakins A. Jimmye Norman, RD, LDN, CDE Pager: [phone] After hours Pager: [phone]

## 2014-05-12 NOTE — Progress Notes (Signed)
PATIENT DETAILS Name: [name] Age: 74 y.o. Sex: male Date of Birth: 12/03/1940 Admit Date: 05/10/2014 Admitting Physician Annita Brod, MD ZWC:[id number], Annie Main, MD  Subjective: Breathing seems to have improved compared to yesterday. Still wheezing but overall much better.  Assessment/Plan: Principal Problem:   Acute Hypoxic Resp Failure: Secondary to COPD exacerbation, not sure if mild acute diastolic CHF contributing. Required BiPAP on admission, now weaned off BiPAP and oxygen. Continue steroids, bronchodilators and empiric levofloxacin.  Active Problems:   COPD with excessive sedation: Improved, continue steroids, scheduled bronchodilators, empiric Levaquin. Moving air much better, still some rhonchi but clearly better than yesterday.  Follow clinically.    Atrial fibrillation with rapid ventricular response: Seen by cardiology on admission, he recently underwent ardioversion. Started on amiodarone infusion. On chronic anticoagulation with Coumadin-pharmacy dosing while inpatient. Cardiology plans cardioversion once respiratory issues most stable.    ? Acute diastolic heart failure: One dose of IV Lasix ordered by cardiology. Follow. Repeat chest x-ray in a.m.    History of CAD-status post CABG: Minimal elevation in troponin, doubt any significance. Continue aspirin, and statin. Poor beta blocker candidate given ongoing bronchospasm.    Dyslipidemia: Continue with statin.    Alcohol dependence: No signs of any withdrawal. Continue with Ativan per CIWA protocol.    GERD (gastroesophageal reflux disease): Continue with PPI    Malnutrition of moderate degree: Nutrition eval  Disposition: Remain inpatient-remain in SDU for another 24 hours  Antibiotics:  See below   Anti-infectives    Start     Dose/Rate Route Frequency Ordered Stop   05/11/14 1500  levofloxacin (LEVAQUIN) tablet 500 mg     500 mg Oral Daily 05/11/14 1449     05/10/14 1400  levofloxacin  (LEVAQUIN) IVPB 500 mg  Status:  Discontinued     500 mg 100 mL/hr over 60 Minutes Intravenous Every 24 hours 05/10/14 1244 05/11/14 1449      DVT Prophylaxis: On coumadin  Code Status: DNR  Family Communication None at bedside  Procedures:  None  CONSULTS:  cardiology   MEDICATIONS: Scheduled Meds: . amiodarone  150 mg Intravenous Once  . amiodarone  400 mg Oral BID  . antiseptic oral rinse  7 mL Mouth Rinse BID  . aspirin EC  81 mg Oral Daily  . budesonide (PULMICORT) nebulizer solution  0.25 mg Nebulization BID  . diltiazem  180 mg Oral Daily  . docusate sodium  100 mg Oral BID  . folic acid  1 mg Oral Daily  . [START ON 05/13/2014] furosemide  40 mg Oral Daily  . guaiFENesin  600 mg Oral BID  . insulin aspart  0-9 Units Subcutaneous TID WC  . ipratropium  0.5 mg Nebulization Q6H  . levalbuterol  0.63 mg Nebulization Q6H  . levofloxacin  500 mg Oral Q1500  . methylPREDNISolone (SOLU-MEDROL) injection  60 mg Intravenous TID  . multivitamin with minerals  1 tablet Oral Daily  . pantoprazole  40 mg Oral Daily  . pneumococcal 23 valent vaccine  0.5 mL Intramuscular Tomorrow-1000  . rosuvastatin  10 mg Oral q morning - 10a  . sodium chloride  3 mL Intravenous Q12H  . thiamine  100 mg Oral Daily   Or  . thiamine  100 mg Intravenous Daily  . cyanocobalamin  2,000 mcg Oral Daily  . Warfarin - Pharmacist Dosing Inpatient   Does not apply q1800   Continuous Infusions:   PRN Meds:.acetaminophen **OR** acetaminophen,  albuterol, alum & mag hydroxide-simeth, docusate sodium, LORazepam **OR** LORazepam, ondansetron **OR** ondansetron (ZOFRAN) IV, oxyCODONE    PHYSICAL EXAM: Vital signs in last 24 hours: Filed Vitals:   05/12/14 0436 05/12/14 0655 05/12/14 0809 05/12/14 0820  BP: 134/82 123/75  145/74  Pulse: 88 90  94  Temp: 97.9 F (36.6 C)   97.5 F (36.4 C)  TempSrc: Oral   Oral  Resp: 31 22  29   Height:      Weight: 66.3 kg (146 lb 2.6 oz)     SpO2: 97%  97% 98% 100%    Weight change: 1.4 kg (3 lb 1.4 oz) Filed Weights   05/10/14 1235 05/11/14 0000 05/12/14 0436  Weight: 64.9 kg (143 lb 1.3 oz) 65.409 kg (144 lb 3.2 oz) 66.3 kg (146 lb 2.6 oz)   Body mass index is 24.32 kg/(m^2).   Gen Exam: Awake and alert with clear speech.  Not in acute distress, sitting in chair comfortably. Neck: Supple, No JVD.   Chest: Coarse rhonchi all over-but moving air much more compared to yesterday CVS: S1 S2 Regular, no murmurs.  Abdomen: soft, BS +, non tender, non distended.  Extremities: no edema, lower extremities warm to touch. Neurologic: Non Focal.   Skin: No Rash.   Wounds: N/A.    Intake/Output from previous day:  Intake/Output Summary (Last 24 hours) at 05/12/14 1052 Last data filed at 05/12/14 1031  Gross per 24 hour  Intake  911.9 ml  Output   1090 ml  Net -178.1 ml     LAB RESULTS: CBC  Recent Labs Lab 05/10/14 0718 05/10/14 0724 05/11/14 0258 05/12/14 0248  WBC 10.1  --  8.7 10.3  HGB 15.1 17.0 14.6 13.7  HCT 45.4 50.0 45.0 41.5  PLT 146*  --  134* 153  MCV 102.7*  --  107.4* 104.3*  MCH 34.2*  --  34.8* 34.4*  MCHC 33.3  --  32.4 33.0  RDW 14.0  --  14.1 14.0  LYMPHSABS 0.6*  --   --   --   MONOABS 1.0  --   --   --   EOSABS 0.0  --   --   --   BASOSABS 0.0  --   --   --     Chemistries   Recent Labs Lab 05/10/14 0718 05/10/14 0724 05/11/14 0258 05/12/14 0248  NA 135 137 138 134*  K 4.1 4.1 4.1 4.0  CL 97 97 97 94*  CO2 32  --  38* 33*  GLUCOSE 185* 185* 222* 271*  BUN 7 10 14  25*  CREATININE 0.91 0.70 0.92 1.06  CALCIUM 9.0  --  9.0 8.8    CBG:  Recent Labs Lab 05/11/14 1220 05/11/14 1759 05/11/14 1949 05/11/14 2312 05/12/14 0819  GLUCAP 321* 186* 360* 267* 264*    GFR Estimated Creatinine Clearance: 53.2 mL/min (by C-G formula based on Cr of 1.06).  Coagulation profile  Recent Labs Lab 05/08/14 1127 05/10/14 0718 05/11/14 0258 05/12/14 0248  INR 2.2 2.17* 1.79* 1.57*     Cardiac Enzymes  Recent Labs Lab 05/10/14 1436 05/10/14 2055 05/11/14 0258  TROPONINI 0.04* 0.06* <0.03    Invalid input(s): POCBNP No results for input(s): DDIMER in the last 72 hours.  Recent Labs  05/10/14 1436  HGBA1C 7.3*   No results for input(s): CHOL, HDL, LDLCALC, TRIG, CHOLHDL, LDLDIRECT in the last 72 hours.  Recent Labs  05/10/14 1436  TSH 0.674   No results for input(s):  VITAMINB12, FOLATE, FERRITIN, TIBC, IRON, RETICCTPCT in the last 72 hours. No results for input(s): LIPASE, AMYLASE in the last 72 hours.  Urine Studies No results for input(s): UHGB, CRYS in the last 72 hours.  Invalid input(s): UACOL, UAPR, USPG, UPH, UTP, UGL, UKET, UBIL, UNIT, UROB, ULEU, UEPI, UWBC, URBC, UBAC, CAST, UCOM, BILUA  MICROBIOLOGY: Recent Results (from the past 240 hour(s))  MRSA PCR Screening     Status: None   Collection Time: 05/10/14  1:03 PM  Result Value Ref Range Status   MRSA by PCR NEGATIVE NEGATIVE Final    Comment:        The GeneXpert MRSA Assay (FDA approved for NASAL specimens only), is one component of a comprehensive MRSA colonization surveillance program. It is not intended to diagnose MRSA infection nor to guide or monitor treatment for MRSA infections.     RADIOLOGY STUDIES/RESULTS: X-ray Chest Pa And Lateral  05/11/2014   CLINICAL DATA:  a wet productive cough.  EXAM: CHEST - 2 VIEW  COMPARISON:  05/10/2014  FINDINGS: Previous CABG. Coarse perihilar and basilar interstitial markings with suggestion of mild central pulmonary vascular congestion. 7 mm nodular opacity in the right upper lobe, not seen previously. Heart size upper limits normal. Atheromatous aorta. No effusion. Moderately severe compression deformity in the mid thoracic spine, stable.  IMPRESSION: 1. New central pulmonary vascular congestion and increased in perihilar and bibasilar interstitial prominence. 2. 7 mm possible right upper lung nodule.  Follow-up recommended.    Electronically Signed   By: Lucrezia Europe M.D.   On: 05/11/2014 12:58   Dg Chest Portable 1 View  05/10/2014   CLINICAL DATA:  Shortness of breath  EXAM: PORTABLE CHEST - 1 VIEW  COMPARISON:  04/03/2014  FINDINGS: Multiple monitoring leads overlie the patient. Stable cardiac and mediastinal contours status post median sternotomy. No consolidative pulmonary opacities. No pleural effusion or pneumothorax. Regional skeleton is unremarkable.  IMPRESSION: No acute cardiopulmonary process.   Electronically Signed   By: Lovey Newcomer M.D.   On: 05/10/2014 08:06    Oren Binet, MD  [facility] Pager:[phone]  If 7PM-7AM, please contact night-coverage [email or web address] Password [email or web address] 05/12/2014, 10:52 AM   LOS: 2 days

## 2014-05-12 NOTE — Progress Notes (Signed)
Inpatient Diabetes Program Recommendations  AACE/ADA: New Consensus Statement on Inpatient Glycemic Control (2013)  Target Ranges:  Prepandial:   less than 140 mg/dL      Peak postprandial:   less than 180 mg/dL (1-2 hours)      Critically ill patients:  140 - 180 mg/dL     Results for [name], [name] (MRN [id number]) as of 05/12/2014 13:10  Ref. Range 05/11/2014 07:57 05/11/2014 12:20 05/11/2014 17:59 05/11/2014 19:49 05/11/2014 23:12  Glucose-Capillary Latest Range: 70-99 mg/dL 221 (H) 321 (H) 186 (H) 360 (H) 267 (H)    Results for [name], [name] (MRN [id number]) as of 05/12/2014 13:10  Ref. Range 05/12/2014 08:19 05/12/2014 12:04  Glucose-Capillary Latest Range: 70-99 mg/dL 264 (H) 367 (H)     Admitted with A Fib w/ RVR.  History of DM, HTN, MI, CAD.  Drinks 1/5 ETOH qweek.   Home DM Meds: None listed   Current Orders: Novolog SENSITIVE SSI    **Patient currently receiving IV Solumedrol 60 mg Q8 hours.  **Having significant glucose elevations.  Novolog SSI not enough to maintain normal glucose levels while patient on steroids.  **A1c 7.3% shows decent glucose control at home off DM medications.    MD- Please consider the following insulin adjustments while patient on IV steroids:  1. Start Lantus 10 units QHS (0.15 units/kg dosing) 2. Increase SSI to MODERATE scale tid ac + HS    Will follow Wyn Quaker RN, MSN, CDE Diabetes Coordinator Inpatient Diabetes Program Team Pager: [phone] (8a-10p)

## 2014-05-12 NOTE — Progress Notes (Signed)
ANTICOAGULATION CONSULT NOTE - Follow Up Consult  Pharmacy Consult for Coumadin Indication: atrial fibrillation  Allergies  Allergen Reactions  . Xarelto [Rivaroxaban] Other (See Comments)    makes pt crazy  . Ticlopidine Hcl Swelling    Patient Measurements: Height: 5\' 5"  (165.1 cm) Weight: 146 lb 2.6 oz (66.3 kg) IBW/kg (Calculated) : 61.5 Heparin Dosing Weight:    Vital Signs: Temp: 97.6 F (36.4 C) (02/15 1201) Temp Source: Oral (02/15 1201) BP: 136/92 mmHg (02/15 1201) Pulse Rate: 90 (02/15 1201)  Labs:  Recent Labs  05/10/14 0718 05/10/14 0724 05/10/14 1436 05/10/14 2055 05/11/14 0258 05/12/14 0248  HGB 15.1 17.0  --   --  14.6 13.7  HCT 45.4 50.0  --   --  45.0 41.5  PLT 146*  --   --   --  134* 153  LABPROT 24.4*  --   --   --  21.0* 18.9*  INR 2.17*  --   --   --  1.79* 1.57*  CREATININE 0.91 0.70  --   --  0.92 1.06  TROPONINI <0.03  --  0.04* 0.06* <0.03  --     Estimated Creatinine Clearance: 53.2 mL/min (by C-G formula based on Cr of 1.06).  Assessment: increased SOB - DCCV for Afib on 1/20 but now in Afib with RVR.   AC: Warfarin for Afib, INR 2.17 on admit. Started amio this admit so will need weekly dose decr, also on Levaquin. INR down to 1.57 today. PTA: 2.5 mg/d x 5 mg TTh (22.5 mg/wk)  ID: Levaquin (Qtc 473) for COPD exac, afeb, WBC WNL  CV: HLD, HTN, CAD, PVD - asa81, po amiodarone, diltiazem, lasix, crestor  Endo: DM (A1C 7.3), CBGs elevated 221-367 - no meds PTA (on steroids for COPD)  GI/Nutrition: GERD  Neuro: EtOH abuse - CIWA, FA, thiamine, MVI  Renal: SCr and lytes WNL  Pulm: COPD exac - budesonide, Atovent, levalb neb, SM 40 IV bid, down to 2L [place]  Heme/Onc: H/H WNL, plt 153  PTA Med Issues: resumed  Best Practices: warf  Goal of Therapy:  INR 2-3 Monitor platelets by anticoagulation protocol: Yes   Plan:  Give Coumadin 6mg  po x 1 tonight. Watch for major INR jump from intxn with amiodarone. Daily  INR   Shakaya Bhullar S. Alford Highland, PharmD, BCPS Clinical Staff Pharmacist Pager [phone]  Eilene Ghazi Stillinger 05/12/2014,1:33 PM

## 2014-05-12 NOTE — Clinical Social Work Psychosocial (Signed)
Clinical Social Work Department BRIEF PSYCHOSOCIAL ASSESSMENT 05/12/2014  Patient:  [name], [name]     Account Number:  [id number]     Admit date:  05/10/2014  Clinical Social Worker:  Domenica Reamer, [place]  Date/Time:  05/12/2014 02:02 PM  Referred by:  Physician  Date Referred:  05/12/2014 Referred for  SNF Placement   Other Referral:   Interview type:  Patient Other interview type:    PSYCHOSOCIAL DATA Living Status:  FAMILY Admitted from facility:   Level of care:   Primary support name:  Heide Spark Primary support relationship to patient:  FAMILY Degree of support available:   Patient states that he lives with his son who helps him with day to day activities/ chores.    CURRENT CONCERNS Current Concerns  Post-Acute Placement   Other Concerns:    SOCIAL WORK ASSESSMENT / PLAN CSW spoke with patient concerning PT recommendation for SNF.  Patient states that he has been to SNF before at [facility] and is agreeable to going back to rehab at [facility].   Assessment/plan status:  Psychosocial Support/Ongoing Assessment of Needs Other assessment/ plan:   FL2  PASAR   Information/referral to community resources:    PATIENT'S/FAMILY'S RESPONSE TO PLAN OF CARE: Patient is agreeable to plan for SNF but feels helpless currently due to his extreme SOB when doing activity- patient understands the need for rehab to build back up his strength to go home.       Domenica Reamer, [place] Social Worker [phone]

## 2014-05-12 NOTE — Progress Notes (Signed)
Patient Name: [name] Date of Encounter: 05/12/2014  Principal Problem:   Acute respiratory distress Active Problems:   Hyperlipidemia   Essential hypertension   CAD with history MI   COPD GOLD III   GERD (gastroesophageal reflux disease)   Respiratory failure with hypercapnia   Atrial fibrillation with RVR   Alcohol dependence   Respiratory distress, acute   Malnutrition of moderate degree   COPD exacerbation   Primary Cardiologist: Dr. Burt Knack  Patient Profile: 74 y.o. male, with a past medical history of CABG, end-stage COPD, ETOH abuse, diabetes, peripheral vascular disease, and atrial fibrillation, s/p DCCV January 20. Admitted w/ PAF/RVR & COPD 02/13.  SUBJECTIVE: Breathing no better, no chest pain, no palpitations.  OBJECTIVE Filed Vitals:   05/12/14 0000 05/12/14 0009 05/12/14 0200 05/12/14 0436  BP: 131/83 131/83 127/82 134/82  Pulse: 91 91 98 88  Temp:  97.8 F (36.6 C)  97.9 F (36.6 C)  TempSrc:  Oral  Oral  Resp:  24 25 31   Height:      Weight:    146 lb 2.6 oz (66.3 kg)  SpO2:  96% 100% 97%    Intake/Output Summary (Last 24 hours) at 05/12/14 0744 Last data filed at 05/12/14 0600  Gross per 24 hour  Intake  403.8 ml  Output    450 ml  Net  -46.2 ml   Filed Weights   05/10/14 1235 05/11/14 0000 05/12/14 0436  Weight: 143 lb 1.3 oz (64.9 kg) 144 lb 3.2 oz (65.409 kg) 146 lb 2.6 oz (66.3 kg)    PHYSICAL EXAM General: Well developed, well nourished, male in no acute distress. Head: Normocephalic, atraumatic.  Neck: Supple without bruits, JVD elevated. Lungs:  Resp regular and moderately labored, w/ rales and wheezing. Heart: Irreg irreg, S1, S2, no S3, S4, or murmur; no rub. Abdomen: Soft, non-tender, non-distended, BS + x 4.  Extremities: No clubbing, cyanosis, no edema.  Neuro: Alert and oriented X 3. Moves all extremities spontaneously. Psych: Normal affect.  LABS: CBC: Recent Labs  05/10/14 0718  05/11/14 0258 05/12/14 0248    WBC 10.1  --  8.7 10.3  NEUTROABS 8.4*  --   --   --   HGB 15.1  < > 14.6 13.7  HCT 45.4  < > 45.0 41.5  MCV 102.7*  --  107.4* 104.3*  PLT 146*  --  134* 153  < > = values in this interval not displayed. INR: Recent Labs  05/12/14 0248  INR 1.69*   Basic Metabolic Panel: Recent Labs  05/11/14 0258 05/12/14 0248  NA 138 134*  K 4.1 4.0  CL 97 94*  CO2 38* 33*  GLUCOSE 222* 271*  BUN 14 25*  CREATININE 0.92 1.06  CALCIUM 9.0 8.8   Liver Function Tests: Recent Labs  05/10/14 0718  AST 22  ALT 15  ALKPHOS 44  BILITOT 1.5*  PROT 6.8  ALBUMIN 3.6   Cardiac Enzymes: Recent Labs  05/10/14 1436 05/10/14 2055 05/11/14 0258  TROPONINI 0.04* 0.06* <0.03    Recent Labs  05/10/14 0722  TROPIPOC 0.01   BNP: PRO B NATRIURETIC PEPTIDE (BNP)  Date/Time Value Ref Range Status  03/17/2014 10:32 AM 142.0* 0.0 - 100.0 pg/mL Final  04/15/2011 12:22 PM 129.0* 0.0 - 100.0 pg/mL Final   Thyroid Function Tests: Recent Labs  05/10/14 1436  TSH 0.674   TELE:   Atrial fib, rate generally OK; 9 bt  And 3 bt runs NSVT,  Radiology/Studies: X-ray Chest Pa And Lateral 05/11/2014   CLINICAL DATA:  a wet productive cough.  EXAM: CHEST - 2 VIEW  COMPARISON:  05/10/2014  FINDINGS: Previous CABG. Coarse perihilar and basilar interstitial markings with suggestion of mild central pulmonary vascular congestion. 7 mm nodular opacity in the right upper lobe, not seen previously. Heart size upper limits normal. Atheromatous aorta. No effusion. Moderately severe compression deformity in the mid thoracic spine, stable.  IMPRESSION: 1. New central pulmonary vascular congestion and increased in perihilar and bibasilar interstitial prominence. 2. 7 mm possible right upper lung nodule.  Follow-up recommended.   Electronically Signed   By: Lucrezia Europe M.D.   On: 05/11/2014 12:58   Dg Chest Portable 1 View 05/10/2014   CLINICAL DATA:  Shortness of breath  EXAM: PORTABLE CHEST - 1 VIEW  COMPARISON:   04/03/2014  FINDINGS: Multiple monitoring leads overlie the patient. Stable cardiac and mediastinal contours status post median sternotomy. No consolidative pulmonary opacities. No pleural effusion or pneumothorax. Regional skeleton is unremarkable.  IMPRESSION: No acute cardiopulmonary process.   Electronically Signed   By: Lovey Newcomer M.D.   On: 05/10/2014 08:06     Current Medications:  . amiodarone  150 mg Intravenous Once  . aspirin EC  81 mg Oral Daily  . budesonide (PULMICORT) nebulizer solution  0.25 mg Nebulization BID  . diltiazem  180 mg Oral Daily  . docusate sodium  100 mg Oral BID  . folic acid  1 mg Oral Daily  . furosemide  40 mg Oral Daily  . guaiFENesin  600 mg Oral BID  . insulin aspart  0-9 Units Subcutaneous TID WC  . ipratropium  0.5 mg Nebulization Q6H  . levalbuterol  0.63 mg Nebulization Q6H  . levofloxacin  500 mg Oral Q1500  . methylPREDNISolone (SOLU-MEDROL) injection  60 mg Intravenous TID  . multivitamin with minerals  1 tablet Oral Daily  . pantoprazole  40 mg Oral Daily  . pneumococcal 23 valent vaccine  0.5 mL Intramuscular Tomorrow-1000  . rosuvastatin  10 mg Oral q morning - 10a  . sodium chloride  3 mL Intravenous Q12H  . thiamine  100 mg Oral Daily   Or  . thiamine  100 mg Intravenous Daily  . cyanocobalamin  2,000 mcg Oral Daily  . Warfarin - Pharmacist Dosing Inpatient   Does not apply q1800   . amiodarone 30 mg/hr (05/12/14 0400)    ASSESSMENT AND PLAN: 1. Atrial fib:  - Afib is likely due to the COPD exacerbation. - He seems to be tolerating the A-fib fairly well and if he continues to tolerate it, continue loading of the amiodarone and re-attempt cardioversion in 1 month.  - The Amiodarone load would increase the chances of him remaining in NSR. - On the other hand, if he clearly does not tolerate the Afib, we can consider TEE/DCCV (subtherapeutic coumadin) soon. - since HR fairly well controlled, will change amio to PO, 400 mg  bid   2. Essential HTN:  - BP is ok. - Continue current meds. Diltiazem CD 180 mg daily  3. Hyperlipidemia:  - Continue crestor   4. COPD :  - plan per Int. Med.   5. ?volume overload, EF 55-60% by echo 01/2014 - weight is trending up, CXR worsening - on home dose of Lasix 40 mg daily - with worsening CXR, will give him Lasix 40 mg IV x 1 today, resume PO rx in am.   Signed, Rosaria Ferries , PA-C  7:44 AM 05/12/2014

## 2014-05-12 NOTE — Clinical Social Work Placement (Signed)
Clinical Social Work Department CLINICAL SOCIAL WORK PLACEMENT NOTE 05/12/2014  Patient:  [name], [name]  Account Number:  [id number] Admit date:  05/10/2014  Clinical Social Worker:  Domenica Reamer, CLINICAL SOCIAL WORKER  Date/time:  05/12/2014 02:09 PM  Clinical Social Work is seeking post-discharge placement for this patient at the following level of care:   SKILLED NURSING   (*CSW will update this form in Epic as items are completed)   05/12/2014  Patient/family provided with [place] Department of Clinical Social Work's list of facilities offering this level of care within the geographic area requested by the patient (or if unable, by the patient's family).  05/12/2014  Patient/family informed of their freedom to choose among providers that offer the needed level of care, that participate in Medicare, Medicaid or managed care program needed by the patient, have an available bed and are willing to accept the patient.  05/12/2014  Patient/family informed of [facility]' ownership interest in [facility], as well as of the fact that they are under no obligation to receive care at this facility.  PASARR submitted to EDS on 05/12/2014 PASARR number received on 05/12/2014  FL2 transmitted to all facilities in geographic area requested by pt/family on  05/12/2014 FL2 transmitted to all facilities within larger geographic area on   Patient informed that his/her managed care company has contracts with or will negotiate with  certain facilities, including the following:     Patient/family informed of bed offers received:   Patient chooses bed at  Physician recommends and patient chooses bed at    Patient to be transferred to  on   Patient to be transferred to facility by  Patient and family notified of transfer on  Name of family member notified:    The following physician request were entered in Epic:   Additional Comments: Domenica Reamer, [place] Social  Worker [phone]

## 2014-05-13 ENCOUNTER — Inpatient Hospital Stay (HOSPITAL_COMMUNITY): Payer: Medicare Other

## 2014-05-13 DIAGNOSIS — I5031 Acute diastolic (congestive) heart failure: Secondary | ICD-10-CM | POA: Insufficient documentation

## 2014-05-13 LAB — BASIC METABOLIC PANEL
ANION GAP: 14 (ref 5–15)
BUN: 27 mg/dL — ABNORMAL HIGH (ref 6–23)
CALCIUM: 8.8 mg/dL (ref 8.4–10.5)
CHLORIDE: 93 mmol/L — AB (ref 96–112)
CO2: 30 mmol/L (ref 19–32)
Creatinine, Ser: 0.94 mg/dL (ref 0.50–1.35)
GFR calc Af Amer: >90 mL/min (ref >90)
GFR calc non Af Amer: 80 mL/min — ABNORMAL LOW (ref >90)
Glucose, Bld: 239 mg/dL — ABNORMAL HIGH (ref 70–99)
Potassium: 4 mmol/L (ref 3.5–5.1)
SODIUM: 137 mmol/L (ref 135–145)

## 2014-05-13 LAB — GLUCOSE, CAPILLARY
GLUCOSE-CAPILLARY: 252 mg/dL — AB (ref 70–99)
GLUCOSE-CAPILLARY: 290 mg/dL — AB (ref 70–99)
GLUCOSE-CAPILLARY: 369 mg/dL — AB (ref 70–99)
Glucose-Capillary: 230 mg/dL — ABNORMAL HIGH (ref 70–99)
Glucose-Capillary: 230 mg/dL — ABNORMAL HIGH (ref 70–99)
Glucose-Capillary: 349 mg/dL — ABNORMAL HIGH (ref 70–99)

## 2014-05-13 LAB — BRAIN NATRIURETIC PEPTIDE: B NATRIURETIC PEPTIDE 5: 309.4 pg/mL — AB (ref 0.0–100.0)

## 2014-05-13 LAB — PROTIME-INR
INR: 1.48 (ref 0.00–1.49)
Prothrombin Time: 18 seconds — ABNORMAL HIGH (ref 11.6–15.2)

## 2014-05-13 MED ORDER — LEVALBUTEROL HCL 0.63 MG/3ML IN NEBU
0.6300 mg | INHALATION_SOLUTION | Freq: Three times a day (TID) | RESPIRATORY_TRACT | Status: DC
  Administered 2014-05-14 – 2014-05-16 (×7): 0.63 mg via RESPIRATORY_TRACT
  Filled 2014-05-13 (×17): qty 3

## 2014-05-13 MED ORDER — WARFARIN SODIUM 6 MG PO TABS
6.0000 mg | ORAL_TABLET | Freq: Once | ORAL | Status: AC
  Administered 2014-05-13: 6 mg via ORAL
  Filled 2014-05-13: qty 1

## 2014-05-13 MED ORDER — IPRATROPIUM BROMIDE 0.02 % IN SOLN
0.5000 mg | Freq: Three times a day (TID) | RESPIRATORY_TRACT | Status: DC
  Administered 2014-05-14 – 2014-05-16 (×7): 0.5 mg via RESPIRATORY_TRACT
  Filled 2014-05-13 (×9): qty 2.5

## 2014-05-13 MED ORDER — METHYLPREDNISOLONE SODIUM SUCC 40 MG IJ SOLR
40.0000 mg | Freq: Three times a day (TID) | INTRAMUSCULAR | Status: DC
  Administered 2014-05-13 – 2014-05-15 (×7): 40 mg via INTRAVENOUS
  Filled 2014-05-13 (×9): qty 1

## 2014-05-13 MED ORDER — GUAIFENESIN-DM 100-10 MG/5ML PO SYRP
5.0000 mL | ORAL_SOLUTION | ORAL | Status: DC | PRN
  Administered 2014-05-13 – 2014-05-14 (×2): 5 mL via ORAL
  Filled 2014-05-13 (×2): qty 5

## 2014-05-13 NOTE — Progress Notes (Signed)
Physical Therapy Treatment Patient Details Name: [name] MRN: [id number] DOB: July 23, 1940 Today's Date: 06/06/14    History of Present Illness Acute respiratory failure in the setting of chronic COPD, diastolic heart failure and atrial fibrillation. Pt reported HR up to 300 with SOB on arrival to ED. PMH: alcohol abuse, PVD, CABG, DM, afib with cardioversion, end stage COPD.    PT Comments    Pt making steady progress.  Follow Up Recommendations  SNF     Equipment Recommendations  Other (comment) (rollator)    Recommendations for Other Services       Precautions / Restrictions Precautions Precautions: Fall    Mobility  Bed Mobility                  Transfers Overall transfer level: Needs assistance   Transfers: Sit to/from Stand Sit to Stand: Supervision         General transfer comment: Verbal cues for hand placement  Ambulation/Gait Ambulation/Gait assistance: Min assist Ambulation Distance (Feet): 150 Feet Assistive device: Rolling walker (2 wheeled) Gait Pattern/deviations: Step-through pattern;Decreased step length - right;Decreased step length - left;Trunk flexed   Gait velocity interpretation: Below normal speed for age/gender General Gait Details: Verbal cues to stand more erect. Wheezing with amb with SaO2 >94% on RA   Stairs            Wheelchair Mobility    Modified Rankin (Stroke Patients Only)       Balance   Sitting-balance support: No upper extremity supported Sitting balance-Leahy Scale: Good       Standing balance-Leahy Scale: Fair                      Cognition Arousal/Alertness: Awake/alert Behavior During Therapy: WFL for tasks assessed/performed Overall Cognitive Status: Impaired/Different from baseline         Following Commands: Follows one step commands consistently Safety/Judgement: Decreased awareness of safety;Decreased awareness of deficits   Problem Solving: Slow processing;Requires  verbal cues      Exercises      General Comments        Pertinent Vitals/Pain Pain Assessment: No/denies pain    Home Living                      Prior Function            PT Goals (current goals can now be found in the care plan section) Progress towards PT goals: Progressing toward goals    Frequency  Min 3X/week    PT Plan Current plan remains appropriate    Co-evaluation             End of Session Equipment Utilized During Treatment: Gait belt Activity Tolerance: Patient tolerated treatment well Patient left: in chair;with call bell/phone within reach;with family/visitor present     Time: 9449-6759 PT Time Calculation (min) (ACUTE ONLY): 14 min  Charges:  $Gait Training: 8-22 mins                    G Codes:      Ryna Beckstrom June 06, 2014, 3:41 PM  [facility] PT [phone]

## 2014-05-13 NOTE — Progress Notes (Signed)
PATIENT DETAILS Name: [name] Age: 74 y.o. Sex: male Date of Birth: 10/15/1940 Admit Date: 05/10/2014 Admitting Physician Annita Brod, MD UMP:[id number], Annie Main, MD  Brief narrative:  74 year old gentleman with history of COPD on home O2, history of CAD status post CABG with was admitted with worsening shortness of breath, found to have acute on chronic hypoxic respiratory failure secondary to COPD exacerbation required BiPAP on admission. Also found to have atrial fibrillation with rapid ventricular response. Patient was admitted to a step down unit, cardiology was consulted, he was placed on amiodarone infusion for A. fib with RVR, he was placed on steroids, nebulized bronchodilators and weaned off the BiPAP. Currently doing well, and being transferred to a telemetry unit later today.   Subjective: Improving slowly. No major issues overnight  Assessment/Plan: Principal Problem:   Acute Hypoxic Resp Failure: Secondary to COPD exacerbation, not sure if mild acute diastolic CHF contributing. Required BiPAP on admission, weaned off BiPAP and oxygen. Continue steroids-taper Solu-Medrol, bronchodilators and empiric levofloxacin.  Active Problems:   COPD with  exacerbation Improved, continue steroids- taper, scheduled bronchodilators, empiric Levaquin. Moving air much better, still some rhonchi but clearly better than yesterday.  Follow clinically.    Atrial fibrillation with rapid ventricular response: Seen by cardiology on admission, he recently underwent ardioversion. Started on amiodarone infusion. On chronic anticoagulation with Coumadin-pharmacy dosing while inpatient. Cardiology plans cardioversion once respiratory issues most stable.    ? Acute diastolic heart failure: One dose of IV Lasix ordered by cardiology. Follow. Repeat chest x-ray this am-much improved    History of CAD-status post CABG: Minimal elevation in troponin, doubt any significance. Continue aspirin, and  statin. Poor beta blocker candidate given ongoing bronchospasm.    Dyslipidemia: Continue with statin.    Alcohol dependence: No signs of any withdrawal. Continue with Ativan per CIWA protocol.    GERD (gastroesophageal reflux disease): Continue with PPI    Malnutrition of moderate degree: Nutrition eval  Disposition: Remain inpatient-Transfer out of SDU-SNF on discharge in next 1-2 days  Antibiotics:  See below   Anti-infectives    Start     Dose/Rate Route Frequency Ordered Stop   05/11/14 1500  levofloxacin (LEVAQUIN) tablet 500 mg     500 mg Oral Daily 05/11/14 1449     05/10/14 1400  levofloxacin (LEVAQUIN) IVPB 500 mg  Status:  Discontinued     500 mg 100 mL/hr over 60 Minutes Intravenous Every 24 hours 05/10/14 1244 05/11/14 1449      DVT Prophylaxis: On coumadin  Code Status: DNR  Family Communication None at bedside  Procedures:  None  CONSULTS:  cardiology   MEDICATIONS: Scheduled Meds: . amiodarone  400 mg Oral BID  . antiseptic oral rinse  7 mL Mouth Rinse BID  . aspirin EC  81 mg Oral Daily  . budesonide (PULMICORT) nebulizer solution  0.25 mg Nebulization BID  . diltiazem  180 mg Oral Daily  . docusate sodium  100 mg Oral BID  . folic acid  1 mg Oral Daily  . furosemide  40 mg Oral Daily  . guaiFENesin  600 mg Oral BID  . insulin aspart  0-9 Units Subcutaneous TID WC  . ipratropium  0.5 mg Nebulization Q6H  . levalbuterol  0.63 mg Nebulization Q6H  . levofloxacin  500 mg Oral Q1500  . methylPREDNISolone (SOLU-MEDROL) injection  60 mg Intravenous TID  . multivitamin with minerals  1 tablet Oral Daily  .  pantoprazole  40 mg Oral Daily  . pneumococcal 23 valent vaccine  0.5 mL Intramuscular Tomorrow-1000  . rosuvastatin  10 mg Oral q morning - 10a  . sodium chloride  3 mL Intravenous Q12H  . thiamine  100 mg Oral Daily   Or  . thiamine  100 mg Intravenous Daily  . cyanocobalamin  2,000 mcg Oral Daily  . Warfarin - Pharmacist Dosing  Inpatient   Does not apply q1800   Continuous Infusions:   PRN Meds:.acetaminophen **OR** acetaminophen, albuterol, alum & mag hydroxide-simeth, LORazepam **OR** LORazepam, ondansetron **OR** ondansetron (ZOFRAN) IV, oxyCODONE    PHYSICAL EXAM: Vital signs in last 24 hours: Filed Vitals:   05/13/14 0800 05/13/14 0825 05/13/14 0844 05/13/14 0900  BP: 139/82  135/86 112/77  Pulse: 86  98 98  Temp:   97.8 F (36.6 C)   TempSrc:   Oral   Resp: 25  29 27   Height:      Weight:      SpO2:  97% 98% 97%    Weight change: -0.5 kg (-1 lb 1.6 oz) Filed Weights   05/11/14 0000 05/12/14 0436 05/13/14 0500  Weight: 65.409 kg (144 lb 3.2 oz) 66.3 kg (146 lb 2.6 oz) 65.8 kg (145 lb 1 oz)   Body mass index is 24.14 kg/(m^2).   Gen Exam: Awake and alert with clear speech.  Not in acute distress Neck: Supple, No JVD.   Chest: Coarse rhonchi all over-but moving air much more compared to yesterday CVS: S1 S2 Regular, no murmurs.  Abdomen: soft, BS +, non tender, non distended.  Extremities: no edema, lower extremities warm to touch. Neurologic: Non Focal.   Skin: No Rash.   Wounds: N/A.    Intake/Output from previous day:  Intake/Output Summary (Last 24 hours) at 05/13/14 0958 Last data filed at 05/13/14 0848  Gross per 24 hour  Intake    528 ml  Output    860 ml  Net   -332 ml     LAB RESULTS: CBC  Recent Labs Lab 05/10/14 0718 05/10/14 0724 05/11/14 0258 05/12/14 0248  WBC 10.1  --  8.7 10.3  HGB 15.1 17.0 14.6 13.7  HCT 45.4 50.0 45.0 41.5  PLT 146*  --  134* 153  MCV 102.7*  --  107.4* 104.3*  MCH 34.2*  --  34.8* 34.4*  MCHC 33.3  --  32.4 33.0  RDW 14.0  --  14.1 14.0  LYMPHSABS 0.6*  --   --   --   MONOABS 1.0  --   --   --   EOSABS 0.0  --   --   --   BASOSABS 0.0  --   --   --     Chemistries   Recent Labs Lab 05/10/14 0718 05/10/14 0724 05/11/14 0258 05/12/14 0248 05/13/14 0317  NA 135 137 138 134* 137  K 4.1 4.1 4.1 4.0 4.0  CL 97 97 97 94*  93*  CO2 32  --  38* 33* 30  GLUCOSE 185* 185* 222* 271* 239*  BUN 7 10 14  25* 27*  CREATININE 0.91 0.70 0.92 1.06 0.94  CALCIUM 9.0  --  9.0 8.8 8.8    CBG:  Recent Labs Lab 05/12/14 0819 05/12/14 1204 05/12/14 1802 05/12/14 2203 05/13/14 0846  GLUCAP 264* 367* 132* 209* 230*    GFR Estimated Creatinine Clearance: 60 mL/min (by C-G formula based on Cr of 0.94).  Coagulation profile  Recent Labs Lab 05/08/14 1127 05/10/14  8338 05/11/14 0258 05/12/14 0248 05/13/14 0317  INR 2.2 2.17* 1.79* 1.57* 1.48    Cardiac Enzymes  Recent Labs Lab 05/10/14 1436 05/10/14 2055 05/11/14 0258  TROPONINI 0.04* 0.06* <0.03    Invalid input(s): POCBNP No results for input(s): DDIMER in the last 72 hours.  Recent Labs  05/10/14 1436  HGBA1C 7.3*   No results for input(s): CHOL, HDL, LDLCALC, TRIG, CHOLHDL, LDLDIRECT in the last 72 hours.  Recent Labs  05/10/14 1436  TSH 0.674   No results for input(s): VITAMINB12, FOLATE, FERRITIN, TIBC, IRON, RETICCTPCT in the last 72 hours. No results for input(s): LIPASE, AMYLASE in the last 72 hours.  Urine Studies No results for input(s): UHGB, CRYS in the last 72 hours.  Invalid input(s): UACOL, UAPR, USPG, UPH, UTP, UGL, UKET, UBIL, UNIT, UROB, ULEU, UEPI, UWBC, URBC, UBAC, CAST, UCOM, BILUA  MICROBIOLOGY: Recent Results (from the past 240 hour(s))  MRSA PCR Screening     Status: None   Collection Time: 05/10/14  1:03 PM  Result Value Ref Range Status   MRSA by PCR NEGATIVE NEGATIVE Final    Comment:        The GeneXpert MRSA Assay (FDA approved for NASAL specimens only), is one component of a comprehensive MRSA colonization surveillance program. It is not intended to diagnose MRSA infection nor to guide or monitor treatment for MRSA infections.     RADIOLOGY STUDIES/RESULTS: Dg Chest 2 View  05/13/2014   CLINICAL DATA:  Weakness and shortness of breath, history of Coronary artery disease freely with CABG and  stent placement, COPD  EXAM: CHEST  2 VIEW  COMPARISON:  PA and lateral chest x-ray of May 11, 2014  FINDINGS: The lungs are well-expanded. There is no focal infiltrate nor pleural effusion. The cardiac silhouette is top-normal in size. The pulmonary vascularity is not engorged. Subtle nodularity demonstrated in the right upper lung field on yesterday's study is not apparent today. There are post CABG changes. No large pleural effusion is demonstrated. The posterior costophrenic angles are excluded from the study.  IMPRESSION: Improved appearance of the pulmonary interstitium consistent with resolving CHF. Stable changes of COPD. No abnormal pulmonary nodules are evident today.   Electronically Signed   By: David  Martinique   On: 05/13/2014 07:37   X-ray Chest Pa And Lateral  05/11/2014   CLINICAL DATA:  a wet productive cough.  EXAM: CHEST - 2 VIEW  COMPARISON:  05/10/2014  FINDINGS: Previous CABG. Coarse perihilar and basilar interstitial markings with suggestion of mild central pulmonary vascular congestion. 7 mm nodular opacity in the right upper lobe, not seen previously. Heart size upper limits normal. Atheromatous aorta. No effusion. Moderately severe compression deformity in the mid thoracic spine, stable.  IMPRESSION: 1. New central pulmonary vascular congestion and increased in perihilar and bibasilar interstitial prominence. 2. 7 mm possible right upper lung nodule.  Follow-up recommended.   Electronically Signed   By: Lucrezia Europe M.D.   On: 05/11/2014 12:58   Dg Chest Portable 1 View  05/10/2014   CLINICAL DATA:  Shortness of breath  EXAM: PORTABLE CHEST - 1 VIEW  COMPARISON:  04/03/2014  FINDINGS: Multiple monitoring leads overlie the patient. Stable cardiac and mediastinal contours status post median sternotomy. No consolidative pulmonary opacities. No pleural effusion or pneumothorax. Regional skeleton is unremarkable.  IMPRESSION: No acute cardiopulmonary process.   Electronically Signed   By:  Lovey Newcomer M.D.   On: 05/10/2014 08:06    Oren Binet, MD  [facility]  Pager:[phone]  If 7PM-7AM, please contact night-coverage [email or web address] Password [email or web address] 05/13/2014, 9:58 AM   LOS: 3 days

## 2014-05-13 NOTE — Progress Notes (Addendum)
       Patient Name: [name] Date of Encounter: 05/13/2014    SUBJECTIVE: He seems to better. He feels better. Son feels he is better. Got some sleep last PM.  TELEMETRY:  A fib with better rate control. Ave HR ~90-100 Filed Vitals:   05/13/14 0800 05/13/14 0825 05/13/14 0844 05/13/14 0900  BP: 139/82  135/86 112/77  Pulse: 86  98 98  Temp:   97.8 F (36.6 C)   TempSrc:   Oral   Resp: 25  29 27   Height:      Weight:      SpO2:  97% 98% 97%    Intake/Output Summary (Last 24 hours) at 05/13/14 1308 Last data filed at 05/13/14 1200  Gross per 24 hour  Intake    528 ml  Output    650 ml  Net   -122 ml   LABS: Basic Metabolic Panel:  Recent Labs  05/12/14 0248 05/13/14 0317  NA 134* 137  K 4.0 4.0  CL 94* 93*  CO2 33* 30  GLUCOSE 271* 239*  BUN 25* 27*  CREATININE 1.06 0.94  CALCIUM 8.8 8.8   CBC:  Recent Labs  05/11/14 0258 05/12/14 0248  WBC 8.7 10.3  HGB 14.6 13.7  HCT 45.0 41.5  MCV 107.4* 104.3*  PLT 134* 153   Cardiac Enzymes:  Recent Labs  05/10/14 1436 05/10/14 2055 05/11/14 0258  TROPONINI 0.04* 0.06* <0.03   Hemoglobin A1C:  Recent Labs  05/10/14 1436  HGBA1C 7.3*   Fasting Lipid Panel: No results for input(s): CHOL, HDL, LDLCALC, TRIG, CHOLHDL, LDLDIRECT in the last 72 hours.  Radiology/Studies:  CXR today shows improving CHF.  Physical Exam: Blood pressure 112/77, pulse 98, temperature 97.8 F (36.6 C), temperature source Oral, resp. rate 27, height 5\' 5"  (1.651 m), weight 145 lb 1 oz (65.8 kg), SpO2 97 %. Weight change: -1 lb 1.6 oz (-0.5 kg)  Wt Readings from Last 3 Encounters:  05/13/14 145 lb 1 oz (65.8 kg)  04/03/14 149 lb (67.586 kg)  03/19/14 152 lb (68.947 kg)    Still faint expiratory wheezes.  ASSESSMENT:  1. Acute on chronic diastolic heart failure with pulmonary edema. 2. Atrial fibrillation with improved rate control. 3. COPD with exaccerbation  Plan:  Agree with furosemide but must follow  renal function closely Continue amio load.  Demetrios Isaacs 05/13/2014, 1:08 PM

## 2014-05-13 NOTE — Progress Notes (Signed)
Gave report to [employer] on 3E

## 2014-05-13 NOTE — Progress Notes (Signed)
Sw talked with pt regarding bed offers. Pt would like to accept the bed offer at [facility] when d/c/

## 2014-05-13 NOTE — Progress Notes (Signed)
ANTICOAGULATION CONSULT NOTE - Follow Up Consult  Pharmacy Consult for Coumadin Indication: atrial fibrillation  Allergies  Allergen Reactions  . Xarelto [Rivaroxaban] Other (See Comments)    makes pt crazy  . Ticlopidine Hcl Swelling    Patient Measurements: Height: 5\' 5"  (165.1 cm) Weight: 145 lb 1 oz (65.8 kg) IBW/kg (Calculated) : 61.5 Heparin Dosing Weight:    Vital Signs: Temp: 97.8 F (36.6 C) (02/16 0844) Temp Source: Oral (02/16 0844) BP: 112/77 mmHg (02/16 0900) Pulse Rate: 98 (02/16 0900)  Labs:  Recent Labs  05/10/14 1436 05/10/14 2055 05/11/14 0258 05/12/14 0248 05/13/14 0317  HGB  --   --  14.6 13.7  --   HCT  --   --  45.0 41.5  --   PLT  --   --  134* 153  --   LABPROT  --   --  21.0* 18.9* 18.0*  INR  --   --  1.79* 1.57* 1.48  CREATININE  --   --  0.92 1.06 0.94  TROPONINI 0.04* 0.06* <0.03  --   --     Estimated Creatinine Clearance: 60 mL/min (by C-G formula based on Cr of 0.94).  Assessment: increased SOB - DCCV for Afib on 1/20 but now in Afib with RVR.   AC: Warfarin for Afib, INR 2.17 on admit (2.5 mg/d x 5 mg TTh (22.5 mg/wk)). Started amio this admit so will need weekly dose decr, also on Levaquin. INR down to 1.48 today.  ID: Levaquin (BZM 080) for COPD exac, afeb, WBC WNL  CV: HLD, HTN, CAD, PVD - asa81, po amiodarone, diltiazem, lasix, crestor. Plan CV when more stable.  Endo: DM (A1C 7.3), CBGs elevated 132-367 - no meds PTA (on steroids for COPD). SSI added.  GI/Nutrition: GERD on po PPI  Neuro: EtOH abuse - CIWA, FA, thiamine, MVI, B12  Renal: SCr and lytes WNL  Pulm: COPD exac on home O2 - budesonide, Atovent, levalb neb, SM 40 IV bid, down to 1L [place]  Heme/Onc: H/H WNL, plt 153  PTA Med Issues: resumed  Best Practices: warf  Goal of Therapy:  INR 2-3 Monitor platelets by anticoagulation protocol: Yes   Plan:  Give Coumadin 6mg  po x 1 again tonight. Watch for major INR jump from intxn with amiodarone. Daily  INR   [name], PharmD, BCPS Clinical Staff Pharmacist Pager [phone]  [name] 05/13/2014,11:11 AM

## 2014-05-14 DIAGNOSIS — J9620 Acute and chronic respiratory failure, unspecified whether with hypoxia or hypercapnia: Secondary | ICD-10-CM

## 2014-05-14 DIAGNOSIS — J9601 Acute respiratory failure with hypoxia: Secondary | ICD-10-CM

## 2014-05-14 DIAGNOSIS — J9621 Acute and chronic respiratory failure with hypoxia: Secondary | ICD-10-CM

## 2014-05-14 LAB — BASIC METABOLIC PANEL
ANION GAP: 4 — AB (ref 5–15)
BUN: 23 mg/dL (ref 6–23)
CALCIUM: 8.5 mg/dL (ref 8.4–10.5)
CO2: 35 mmol/L — ABNORMAL HIGH (ref 19–32)
Chloride: 97 mmol/L (ref 96–112)
Creatinine, Ser: 1.04 mg/dL (ref 0.50–1.35)
GFR calc Af Amer: 80 mL/min — ABNORMAL LOW (ref >90)
GFR calc non Af Amer: 69 mL/min — ABNORMAL LOW (ref >90)
GLUCOSE: 243 mg/dL — AB (ref 70–99)
Potassium: 4.3 mmol/L (ref 3.5–5.1)
SODIUM: 136 mmol/L (ref 135–145)

## 2014-05-14 LAB — GLUCOSE, CAPILLARY
GLUCOSE-CAPILLARY: 272 mg/dL — AB (ref 70–99)
GLUCOSE-CAPILLARY: 240 mg/dL — AB (ref 70–99)
GLUCOSE-CAPILLARY: 407 mg/dL — AB (ref 70–99)
GLUCOSE-CAPILLARY: 409 mg/dL — AB (ref 70–99)
Glucose-Capillary: 234 mg/dL — ABNORMAL HIGH (ref 70–99)
Glucose-Capillary: 313 mg/dL — ABNORMAL HIGH (ref 70–99)

## 2014-05-14 LAB — PROTIME-INR
INR: 1.62 — ABNORMAL HIGH (ref 0.00–1.49)
Prothrombin Time: 19.4 seconds — ABNORMAL HIGH (ref 11.6–15.2)

## 2014-05-14 MED ORDER — INSULIN ASPART 100 UNIT/ML ~~LOC~~ SOLN
8.0000 [IU] | Freq: Once | SUBCUTANEOUS | Status: AC
  Administered 2014-05-15: 8 [IU] via SUBCUTANEOUS

## 2014-05-14 MED ORDER — INSULIN ASPART 100 UNIT/ML ~~LOC~~ SOLN
0.0000 [IU] | Freq: Three times a day (TID) | SUBCUTANEOUS | Status: DC
  Administered 2014-05-14: 5 [IU] via SUBCUTANEOUS
  Administered 2014-05-15: 15 [IU] via SUBCUTANEOUS
  Administered 2014-05-15: 5 [IU] via SUBCUTANEOUS
  Administered 2014-05-15: 3 [IU] via SUBCUTANEOUS
  Administered 2014-05-16: 5 [IU] via SUBCUTANEOUS
  Administered 2014-05-16: 11 [IU] via SUBCUTANEOUS

## 2014-05-14 MED ORDER — INSULIN GLARGINE 100 UNIT/ML ~~LOC~~ SOLN
10.0000 [IU] | Freq: Every day | SUBCUTANEOUS | Status: DC
Start: 2014-05-14 — End: 2014-05-14
  Administered 2014-05-14: 10 [IU] via SUBCUTANEOUS
  Filled 2014-05-14: qty 0.1

## 2014-05-14 MED ORDER — INSULIN GLARGINE 100 UNIT/ML ~~LOC~~ SOLN
15.0000 [IU] | Freq: Every day | SUBCUTANEOUS | Status: DC
  Administered 2014-05-15: 15 [IU] via SUBCUTANEOUS
  Filled 2014-05-14 (×2): qty 0.15

## 2014-05-14 MED ORDER — WARFARIN SODIUM 6 MG PO TABS
6.0000 mg | ORAL_TABLET | Freq: Once | ORAL | Status: AC
  Administered 2014-05-14: 6 mg via ORAL
  Filled 2014-05-14: qty 1

## 2014-05-14 NOTE — Progress Notes (Signed)
Patient Name: [name] Date of Encounter: 05/14/2014     Principal Problem:   Acute respiratory distress Active Problems:   Hyperlipidemia   Essential hypertension   CAD with history MI   COPD GOLD III   GERD (gastroesophageal reflux disease)   Respiratory failure with hypercapnia   Atrial fibrillation with RVR   Alcohol dependence   Respiratory distress, acute   Malnutrition of moderate degree   COPD exacerbation   Acute diastolic HF (heart failure)    SUBJECTIVE  Had some SOB this AM which was relived by two breathing treatments. Still has cough with some congestion. NO CP or SOB currently.   CURRENT MEDS . amiodarone  400 mg Oral BID  . antiseptic oral rinse  7 mL Mouth Rinse BID  . aspirin EC  81 mg Oral Daily  . budesonide (PULMICORT) nebulizer solution  0.25 mg Nebulization BID  . diltiazem  180 mg Oral Daily  . docusate sodium  100 mg Oral BID  . folic acid  1 mg Oral Daily  . furosemide  40 mg Oral Daily  . guaiFENesin  600 mg Oral BID  . insulin aspart  0-9 Units Subcutaneous TID WC  . ipratropium  0.5 mg Nebulization TID  . levalbuterol  0.63 mg Nebulization TID  . levofloxacin  500 mg Oral Q1500  . methylPREDNISolone (SOLU-MEDROL) injection  40 mg Intravenous TID  . multivitamin with minerals  1 tablet Oral Daily  . pantoprazole  40 mg Oral Daily  . pneumococcal 23 valent vaccine  0.5 mL Intramuscular Tomorrow-1000  . rosuvastatin  10 mg Oral q morning - 10a  . sodium chloride  3 mL Intravenous Q12H  . thiamine  100 mg Oral Daily   Or  . thiamine  100 mg Intravenous Daily  . cyanocobalamin  2,000 mcg Oral Daily  . Warfarin - Pharmacist Dosing Inpatient   Does not apply q1800    OBJECTIVE  Filed Vitals:   05/13/14 2155 05/14/14 0156 05/14/14 0536 05/14/14 0922  BP: 143/91 138/79 136/74 137/83  Pulse: 81 78 80 87  Temp: 97.9 F (36.6 C) 97.6 F (36.4 C) 97.8 F (36.6 C)   TempSrc: Oral Oral Oral   Resp: 20 18 18    Height:      Weight:    142 lb 14.5 oz (64.821 kg)   SpO2: 94% 96% 98%     Intake/Output Summary (Last 24 hours) at 05/14/14 1054 Last data filed at 05/14/14 0845  Gross per 24 hour  Intake   1080 ml  Output   1200 ml  Net   -120 ml   Filed Weights   05/13/14 0500 05/13/14 1700 05/14/14 0536  Weight: 145 lb 1 oz (65.8 kg) 144 lb 10 oz (65.6 kg) 142 lb 14.5 oz (64.821 kg)    PHYSICAL EXAM  General: Pleasant, NAD. Neuro: Alert and oriented X 3. Moves all extremities spontaneously. Psych: Normal affect. HEENT:  Normal  Neck: Supple without bruits or JVD. Lungs:  Diffuse mild wheezing Heart: irreg irreg no s3, s4, or murmurs. Abdomen: Soft, non-tender, non-distended, BS + x 4.  Extremities: No clubbing, cyanosis or edema. DP/PT/Radials 2+ and equal bilaterally.  Accessory Clinical Findings  CBC  Recent Labs  05/12/14 0248  WBC 10.3  HGB 13.7  HCT 41.5  MCV 104.3*  PLT 277   Basic Metabolic Panel  Recent Labs  05/13/14 0317 05/14/14 0422  NA 137 136  K 4.0 4.3  CL 93* 97  CO2 30 35*  GLUCOSE 239* 243*  BUN 27* 23  CREATININE 0.94 1.04  CALCIUM 8.8 8.5    TELE  Atrial fib with CVR   Radiology/Studies  Dg Chest 2 View  05/13/2014   CLINICAL DATA:  Weakness and shortness of breath, history of Coronary artery disease freely with CABG and stent placement, COPD  EXAM: CHEST  2 VIEW  COMPARISON:  PA and lateral chest x-ray of May 11, 2014  FINDINGS: The lungs are well-expanded. There is no focal infiltrate nor pleural effusion. The cardiac silhouette is top-normal in size. The pulmonary vascularity is not engorged. Subtle nodularity demonstrated in the right upper lung field on yesterday's study is not apparent today. There are post CABG changes. No large pleural effusion is demonstrated. The posterior costophrenic angles are excluded from the study.  IMPRESSION: Improved appearance of the pulmonary interstitium consistent with resolving CHF. Stable changes of COPD. No abnormal  pulmonary nodules are evident today.   Electronically Signed   By: David  Martinique   On: 05/13/2014 07:37   X-ray Chest Pa And Lateral  05/11/2014   CLINICAL DATA:  a wet productive cough.  EXAM: CHEST - 2 VIEW  COMPARISON:  05/10/2014  FINDINGS: Previous CABG. Coarse perihilar and basilar interstitial markings with suggestion of mild central pulmonary vascular congestion. 7 mm nodular opacity in the right upper lobe, not seen previously. Heart size upper limits normal. Atheromatous aorta. No effusion. Moderately severe compression deformity in the mid thoracic spine, stable.  IMPRESSION: 1. New central pulmonary vascular congestion and increased in perihilar and bibasilar interstitial prominence. 2. 7 mm possible right upper lung nodule.  Follow-up recommended.   Electronically Signed   By: Lucrezia Europe M.D.   On: 05/11/2014 12:58   Dg Chest Portable 1 View  05/10/2014   CLINICAL DATA:  Shortness of breath  EXAM: PORTABLE CHEST - 1 VIEW  COMPARISON:  04/03/2014  FINDINGS: Multiple monitoring leads overlie the patient. Stable cardiac and mediastinal contours status post median sternotomy. No consolidative pulmonary opacities. No pleural effusion or pneumothorax. Regional skeleton is unremarkable.  IMPRESSION: No acute cardiopulmonary process.   Electronically Signed   By: Lovey Newcomer M.D.   On: 05/10/2014 08:06    ASSESSMENT AND PLAN 74 y.o. male, with a past medical history of CABG, end-stage COPD, ETOH abuse, diabetes, peripheral vascular disease, and atrial fibrillation, s/p DCCV January 20. Admitted w/ PAF/RVR & COPD 02/13.  1. Atrial fib: with improved rate control. - Afib is likely due to the COPD exacerbation. - Continue amio load and dilt CD 180mg   - INR subtheraputic, continue coumadin per pharmacy  2. Essential HTN:  - BP is ok. - Continue current meds. Diltiazem CD 180 mg daily  3. Hyperlipidemia:  - Continue crestor   4. COPD :  - plan per Int. Med.   5. Acute on  chronic diastolic heart failure with pulmonary edema - Continue Lasix. Now on home dose of Lasix 40 mg daily. No s/s CHF.     Judy Pimple PA-C  Pager [phone]

## 2014-05-14 NOTE — Progress Notes (Signed)
CSW spoke to Elizabeth at [facility]. Bed offer in place for patient at facility and son Bryceson Grape went to the facility this morning to complete admission paperwork. CSW spoke to son this afternoon- he stated that he has been notified by other family that per MD- patient will hopefully be stable for d/c Friday. CSW will monitor daily to determine date of stability. Bed will be available through Friday for d/c to SNF.  Marland KitchenLorie Phenix. Pauline Good, [place]

## 2014-05-14 NOTE — Progress Notes (Signed)
Inpatient Diabetes Program Recommendations  AACE/ADA: New Consensus Statement on Inpatient Glycemic Control  Target Ranges:  Prepandial:   less than 140 mg/dL      Peak postprandial:   less than 180 mg/dL (1-2 hours)      Critically ill patients:  140 - 180 mg/dL  Pager:  [phone] Hours:  8 am-10pm   Reason for Visit: Elevated glucose while on IV steroids  Inpatient Diabetes Program Recommendations Insulin - Basal: Add Lantus 10 units daily Correction (SSI): Increase to Moderate Novolog correction  Courtney Heys PhD, RN, BC-ADM Diabetes Coordinator  Office:  [phone] Team Pager:  [phone]

## 2014-05-14 NOTE — Progress Notes (Signed)
ANTICOAGULATION CONSULT NOTE - Follow Up Consult  Pharmacy Consult for Coumadin Indication: atrial fibrillation  Allergies  Allergen Reactions  . Xarelto [Rivaroxaban] Other (See Comments)    makes pt crazy  . Ticlopidine Hcl Swelling    Patient Measurements: Height: 5\' 5"  (165.1 cm) Weight: 142 lb 14.5 oz (64.821 kg) (Scale A) IBW/kg (Calculated) : 61.5  Vital Signs: Temp: 97.8 F (36.6 C) (02/17 0536) Temp Source: Oral (02/17 0536) BP: 137/83 mmHg (02/17 0922) Pulse Rate: 87 (02/17 0922)  Labs:  Recent Labs  05/12/14 0248 05/13/14 0317 05/14/14 0422  HGB 13.7  --   --   HCT 41.5  --   --   PLT 153  --   --   LABPROT 18.9* 18.0* 19.4*  INR 1.57* 1.48 1.62*  CREATININE 1.06 0.94 1.04    Estimated Creatinine Clearance: 54.2 mL/min (by C-G formula based on Cr of 1.04).  Assessment:   INR is subtherapeutic (1.62) today, but now trending up.   Home Coumadin dose: 2.5 mg daily except 5 mg on Tuesday and Thursday (22.5 mg/week).   INR 2.17 on admit 05/10/14.  Amiodarone and Levaquin begun on 2/13;  Coumadin 2.5 mg (instead of 5 mg) given on Thurs 2/13, in anticipation of Amio and potentially Levaquin effecting INR, but INR dropped.  Usual Coumadin 2.5 mg dose given on 2/14, and INR continued to decline.   Coumadin dose increased to 6 mg on 2/15 and repeated on 2/16.  INR is now trending up again.  Day # 5 amio and levaquin.  Goal of Therapy:  INR 2-3 Monitor platelets by anticoagulation protocol: Yes   Plan:   Coumadin 6 mg x 1 again today.  Daily PT/INR.  CBC in am.  Will continue to watch for Amio + levaquin effect on INR.  Arty Baumgartner, [place] Pager: [phone] 05/14/2014,2:18 PM

## 2014-05-14 NOTE — Progress Notes (Signed)
PROGRESS NOTE  Jeffery WESTRY VHQ:[id number] DOB: 02-07-1941 DOA: 05/10/2014 PCP: Jeffery Reddish, MD  Assessment/Plan:  Expand All Collapse All     PATIENT DETAILS Name: [name] Age: 74 Newman.o. Sex: male Date of Birth: 1940-08-10 Admit Date: 05/10/2014 Admitting Physician Jeffery Brod, MD [id number], Jeffery Main, MD  Brief narrative:  74 year old gentleman with history of COPD on home O2, history of CAD status post CABG with was admitted with worsening shortness of breath, found to have acute on chronic hypoxic respiratory failure secondary to COPD exacerbation required BiPAP on admission. Also found to have atrial fibrillation with rapid ventricular response. Patient was admitted to a step down unit, cardiology was consulted, he was placed on amiodarone infusion for A. fib with RVR, he was placed on steroids, nebulized bronchodilators and weaned off the BiPAP.  Subjective: Improving slowly. No major issues overnight  Assessment/Plan: Principal Problem:  Acute Hypoxic Resp Failure: Secondary to COPD exacerbation, with component of diastolic CHF contributing. Required BiPAP on admission, weaned off BiPAP and oxygen. Continue steroids-taper Solu-Medrol, bronchodilators and empiric levofloxacin. -presently stable on RA  Active Problems:  COPD with exacerbation  -slowly improving -had recent sinus infection -scheduled bronchodilators, empiric Levaquin. Moving air much better, still some rhonchi and scattered wheezing -Continue current dose of intravenous Solu-Medrol  Atrial fibrillation with rapid ventricular response:  -Precipitated by COPD exacerbation Seen by cardiology on admission, he recently underwent ardioversion. Started on amiodarone infusion. On chronic anticoagulation with Coumadin-pharmacy dosing while inpatient.  -appreciate cardiology followup -Continue amiodarone -Rate controlled -Continue  diltiazem CD   Acute on chronic diastolic heart failure: One dose of IV Lasix ordered by cardiology. Follow. 2/16 Repeat chest x-ray this am-much improved -Continue furosemide 40 mg po daily -Monitor renal function  Diabetes mellitus type 2 -Hemoglobin A1c 7.3 -Previously diet controlled -CBGs elevated secondary to steroids -Start Lantus 10 units -change to moderate SSI   History of CAD-status post CABG: Minimal elevation in troponin, doubt any significance. Continue aspirin, and statin. Poor beta blocker candidate given ongoing bronchospasm.   Dyslipidemia: Continue with statin.   Alcohol dependence: No signs of any withdrawal. Continue with Ativan per CIWA protocol.   GERD (gastroesophageal reflux disease): Continue with PPI   Malnutrition of moderate degree: Nutrition eval          Family Communication:   Family updated at beside Disposition Plan:   SNF when medically stable        Procedures/Studies: Dg Chest 2 View  05/13/2014   CLINICAL DATA:  Weakness and shortness of breath, history of Coronary artery disease freely with CABG and stent placement, COPD  EXAM: CHEST  2 VIEW  COMPARISON:  PA and lateral chest x-ray of May 11, 2014  FINDINGS: The lungs are well-expanded. There is no focal infiltrate nor pleural effusion. The cardiac silhouette is top-normal in size. The pulmonary vascularity is not engorged. Subtle nodularity demonstrated in the right upper lung field on yesterday's study is not apparent today. There are post CABG changes. No large pleural effusion is demonstrated. The posterior costophrenic angles are excluded from the study.  IMPRESSION: Improved appearance of the pulmonary interstitium consistent with resolving CHF. Stable changes of COPD. No abnormal pulmonary nodules are evident today.   Electronically Signed   By: Jeffery Miramontes  Newman   On: 05/13/2014 07:37   X-ray Chest Pa And Lateral  05/11/2014   CLINICAL DATA:  a wet productive cough.   EXAM: CHEST - 2  VIEW  COMPARISON:  05/10/2014  FINDINGS: Previous CABG. Coarse perihilar and basilar interstitial markings with suggestion of mild central pulmonary vascular congestion. 7 mm nodular opacity in the right upper lobe, not seen previously. Heart size upper limits normal. Atheromatous aorta. No effusion. Moderately severe compression deformity in the mid thoracic spine, stable.  IMPRESSION: 1. New central pulmonary vascular congestion and increased in perihilar and bibasilar interstitial prominence. 2. 7 mm possible right upper lung nodule.  Follow-up recommended.   Electronically Signed   By: [name] M.D.   On: 05/11/2014 12:58   Dg Chest Portable 1 View  05/10/2014   CLINICAL DATA:  Shortness of breath  EXAM: PORTABLE CHEST - 1 VIEW  COMPARISON:  04/03/2014  FINDINGS: Multiple monitoring leads overlie the patient. Stable cardiac and mediastinal contours status post median sternotomy. No consolidative pulmonary opacities. No pleural effusion or pneumothorax. Regional skeleton is unremarkable.  IMPRESSION: No acute cardiopulmonary process.   Electronically Signed   By: Lovey Newcomer M.D.   On: 05/10/2014 08:06         Subjective: Patient is doing better but still has some dyspnea on exertion. Denies any fevers, chills, chest discomfort, nausea, vomiting, diarrhea,, pain. No dysuria.  Objective: Filed Vitals:   05/13/14 2155 05/14/14 0156 05/14/14 0536 05/14/14 0922  BP: 143/91 138/79 136/74 137/83  Pulse: 81 78 80 87  Temp: 97.9 F (36.6 C) 97.6 F (36.4 C) 97.8 F (36.6 C)   TempSrc: Oral Oral Oral   Resp: 20 18 18    Height:      Weight:   64.821 kg (142 lb 14.5 oz)   SpO2: 94% 96% 98%     Intake/Output Summary (Last 24 hours) at 05/14/14 1222 Last data filed at 05/14/14 0845  Gross per 24 hour  Intake   1080 ml  Output    950 ml  Net    130 ml   Weight change: -0.2 kg (-7.1 oz) Exam:   General:  Pt is alert, follows commands appropriately, not in acute  distress  HEENT: No icterus, No thrush,  [place]/AT  Cardiovascular: IRRR, S1/S2, no rubs, no gallops  Respiratory: Bilateral scattered wheeze. Bilateral rhonchi.  Abdomen: Soft/+BS, non tender, non distended, no guarding  Extremities: No edema, No lymphangitis, No petechiae, No rashes, no synovitis  Data Reviewed: Basic Metabolic Panel:  Recent Labs Lab 05/10/14 0718 05/10/14 0724 05/11/14 0258 05/12/14 0248 05/13/14 0317 05/14/14 0422  NA 135 137 138 134* 137 136  K 4.1 4.1 4.1 4.0 4.0 4.3  CL 97 97 97 94* 93* 97  CO2 32  --  38* 33* 30 35*  GLUCOSE 185* 185* 222* 271* 239* 243*  BUN 7 10 14  25* 27* 23  CREATININE 0.91 0.70 0.92 1.06 0.94 1.04  CALCIUM 9.0  --  9.0 8.8 8.8 8.5   Liver Function Tests:  Recent Labs Lab 05/10/14 0718  AST 22  ALT 15  ALKPHOS 44  BILITOT 1.5*  PROT 6.8  ALBUMIN 3.6   No results for input(s): LIPASE, AMYLASE in the last 168 hours. No results for input(s): AMMONIA in the last 168 hours. CBC:  Recent Labs Lab 05/10/14 0718 05/10/14 0724 05/11/14 0258 05/12/14 0248  WBC 10.1  --  8.7 10.3  NEUTROABS 8.4*  --   --   --   HGB 15.1 17.0 14.6 13.7  HCT 45.4 50.0 45.0 41.5  MCV 102.7*  --  107.4* 104.3*  PLT 146*  --  134* 153  Cardiac Enzymes:  Recent Labs Lab 05/10/14 0718 05/10/14 1436 05/10/14 2055 05/11/14 0258  TROPONINI <0.03 0.04* 0.06* <0.03   BNP: Invalid input(s): POCBNP CBG:  Recent Labs Lab 05/13/14 2102 05/13/14 2351 05/14/14 0404 05/14/14 0644 05/14/14 1054  GLUCAP 349* 252* 272* 234* 313*    Recent Results (from the past 240 hour(s))  MRSA PCR Screening     Status: None   Collection Time: 05/10/14  1:03 PM  Result Value Ref Range Status   MRSA by PCR NEGATIVE NEGATIVE Final    Comment:        The GeneXpert MRSA Assay (FDA approved for NASAL specimens only), is one component of a comprehensive MRSA colonization surveillance program. It is not intended to diagnose MRSA infection nor to  guide or monitor treatment for MRSA infections.      Scheduled Meds: . amiodarone  400 mg Oral BID  . antiseptic oral rinse  7 mL Mouth Rinse BID  . aspirin EC  81 mg Oral Daily  . budesonide (PULMICORT) nebulizer solution  0.25 mg Nebulization BID  . diltiazem  180 mg Oral Daily  . docusate sodium  100 mg Oral BID  . folic acid  1 mg Oral Daily  . furosemide  40 mg Oral Daily  . guaiFENesin  600 mg Oral BID  . insulin aspart  0-9 Units Subcutaneous TID WC  . ipratropium  0.5 mg Nebulization TID  . levalbuterol  0.63 mg Nebulization TID  . levofloxacin  500 mg Oral Q1500  . methylPREDNISolone (SOLU-MEDROL) injection  40 mg Intravenous TID  . multivitamin with minerals  1 tablet Oral Daily  . pantoprazole  40 mg Oral Daily  . pneumococcal 23 valent vaccine  0.5 mL Intramuscular Tomorrow-1000  . rosuvastatin  10 mg Oral q morning - 10a  . sodium chloride  3 mL Intravenous Q12H  . thiamine  100 mg Oral Daily   Or  . thiamine  100 mg Intravenous Daily  . cyanocobalamin  2,000 mcg Oral Daily  . Warfarin - Pharmacist Dosing Inpatient   Does not apply q1800   Continuous Infusions:    Kyi Romanello, DO  [facility] Pager [phone]  If 7PM-7AM, please contact night-coverage [email or web address] Password [email or web address] 05/14/2014, 12:22 PM   LOS: 4 days

## 2014-05-14 NOTE — Care Management Utilization Note (Signed)
UR completed.    Aanchal Cope Wise Darlette Dubow, RN, BSN Phone #[phone]  

## 2014-05-14 NOTE — Progress Notes (Signed)
Pt. With CBG > 400. On call NP, Tylene Fantasia, made aware. New orders received. RN will implement new orders and continue to monitor pt. For changes in condition. Onya Eutsler, Katherine Roan

## 2014-05-14 NOTE — Progress Notes (Signed)
Occupational Therapy Treatment Patient Details Name: [name] MRN: [id number] DOB: [birth year] Today's Date: 05/14/2014    History of present illness Acute respiratory failure in the setting of chronic COPD, diastolic heart failure and atrial fibrillation. Pt reported HR up to 300 with SOB on arrival to ED. PMH: alcohol abuse, PVD, CABG, DM, afib with cardioversion, end stage COPD.   OT comments  Pt progressing toward goals during ADL retraining session today. Pt focus on standing and sitting at sink as well as functional mobility/transfers in room and hallway, rest breaks x2. Cont toward POC goals. Consider energy conservation handout next 1-2 visits.   Follow Up Recommendations  SNF;Supervision/Assistance - 24 hour    Equipment Recommendations  Other (comment) (Defer to next venue)    Recommendations for Other Services      Precautions / Restrictions Precautions Precautions: Fall       Mobility Bed Mobility Overal bed mobility: Modified Independent             General bed mobility comments: HOB elevated  Transfers Overall transfer level: Needs assistance Equipment used: 1 person hand held assist Transfers: Sit to/from Stand;Stand Pivot Transfers Sit to Stand: Supervision;Min guard Stand pivot transfers: Supervision;Min guard            Balance Overall balance assessment: Needs assistance Sitting-balance support: No upper extremity supported;Feet supported Sitting balance-Leahy Scale: Good     Standing balance support: No upper extremity supported;During functional activity Standing balance-Leahy Scale: Fair Standing balance comment: Standing at sink for grooming activity                   ADL Overall ADL's : Needs assistance/impaired     Grooming: Wash/dry hands;Wash/dry face;Oral care;Standing;Sitting;Supervision/safety (Shaving) Grooming Details (indicate cue type and reason): Pt able to stand at sink while brushing teeth, washing face and  hands. Sitting to shave at sink to complete grooming today.                 Toilet Transfer: Regular Clinical cytogeneticist;Ambulation;Min guard Armed forces technical officer Details (indicate cue type and reason):  (Simulated transfer from EOB to chair then recliner after functional mobility in room, hallway and grooming) Toileting- Clothing Manipulation and Hygiene: Sit to/from stand;Min guard       Functional mobility during ADLs: Min guard (Pt ambulated in room, hallway w/ min guard assist noted and brief rest break x2 w/ HHA) General ADL Comments: Pt ambulated in room and hallway today for functional mobility, activity tolerance and endurance w/ HHA. Pt sttes that he furniture walks at home (discussed decreaed safety w/ this). Pt participated in ADL retraining session including verbal discussion of energy conservation techniques, 2 rest breaks and sitting and standing while grooming at sink. Nice progress, vc's for breathing techniques.      Vision  Wears glasses at all times; no change from baseline.                   Perception     Praxis      Cognition   Behavior During Therapy: WFL for tasks assessed/performed Overall Cognitive Status: Impaired/Different from baseline Area of Impairment: Safety/judgement;Awareness;Problem solving        Following Commands: Follows one step commands consistently;Follows multi-step commands inconsistently Safety/Judgement: Decreased awareness of safety;Decreased awareness of deficits   Problem Solving: Slow processing;Requires verbal cues      Extremity/Trunk Assessment               Exercises     Shoulder Instructions  General Comments      Pertinent Vitals/ Pain       Pain Assessment: No/denies pain  Home Living                                          Prior Functioning/Environment              Frequency Min 2X/week     Progress Toward Goals  OT Goals(current goals can now be found in the care plan  section)  Progress towards OT goals: Progressing toward goals  Acute Rehab OT Goals Patient Stated Goal: return home OT Goal Formulation: With patient Time For Goal Achievement: 05/26/14 Potential to Achieve Goals: Good  Plan Discharge plan remains appropriate    Co-evaluation                 End of Session Equipment Utilized During Treatment: Gait belt;Other (comment) (1 HHA during transfers)   Activity Tolerance Patient limited by fatigue   Patient Left in chair;with call bell/phone within reach;with chair alarm set;with family/visitor present   Nurse Communication          Time: 1100-1126 OT Time Calculation (min): 26 min  Charges: OT General Charges $OT Visit: 1 Procedure OT Treatments $Self Care/Home Management : 8-22 mins $Therapeutic Activity: 8-22 mins  Vadie Principato Beth Dixon, OTR/L 05/14/2014, 11:37 AM

## 2014-05-14 NOTE — Care Management Note (Signed)
    Page 1 of 1   05/16/2014     3:44:52 PM CARE MANAGEMENT NOTE 05/16/2014  Patient:  [name], [name]   Account Number:  [id number]  Date Initiated:  05/12/2014  Documentation initiated by:  MAYO,HENRIETTA  Subjective/Objective Assessment:   dx resp distress caused by AFib w/RVR and COPD; lives with brother-in-law    PCP  Garret Reddish     Action/Plan:   Anticipated DC Date:  05/16/2014   Anticipated DC Plan:  [place]  In-house referral  Clinical Social Worker      DC Planning Services  CM consult      Choice offered to / List presented to:             Status of service:  Completed, signed off Medicare Important Message given?  YES (If response is "NO", the following Medicare IM given date fields will be blank) Date Medicare IM given:  05/14/2014 Medicare IM given by:  Melodie Ashworth Date Additional Medicare IM given:   Additional Medicare IM given by:    Discharge Disposition:  [place]  Per UR Regulation:  Reviewed for med. necessity/level of care/duration of stay  If discussed at [place] of Stay Meetings, dates discussed:   05/15/2014    Comments:  05/16/14 Ellan Lambert, RN, BSN [phone] Pt discharged to SNF today, per CSW arrangements.  05/12/14 [place] MSN BSN CCM PT/OT recommend SNF for rehab, pt agrees.  CSW following.

## 2014-05-15 ENCOUNTER — Ambulatory Visit: Payer: Medicare Other | Admitting: Internal Medicine

## 2014-05-15 LAB — PROTIME-INR
INR: 2.16 — ABNORMAL HIGH (ref 0.00–1.49)
Prothrombin Time: 24.3 seconds — ABNORMAL HIGH (ref 11.6–15.2)

## 2014-05-15 LAB — BASIC METABOLIC PANEL
ANION GAP: 6 (ref 5–15)
BUN: 24 mg/dL — ABNORMAL HIGH (ref 6–23)
CALCIUM: 8.5 mg/dL (ref 8.4–10.5)
CO2: 34 mmol/L — ABNORMAL HIGH (ref 19–32)
CREATININE: 0.92 mg/dL (ref 0.50–1.35)
Chloride: 99 mmol/L (ref 96–112)
GFR calc Af Amer: >90 mL/min (ref >90)
GFR calc non Af Amer: 81 mL/min — ABNORMAL LOW (ref >90)
Glucose, Bld: 158 mg/dL — ABNORMAL HIGH (ref 70–99)
Potassium: 3.5 mmol/L (ref 3.5–5.1)
SODIUM: 139 mmol/L (ref 135–145)

## 2014-05-15 LAB — CBC
HCT: 44.4 % (ref 39.0–52.0)
Hemoglobin: 14.8 g/dL (ref 13.0–17.0)
MCH: 34.9 pg — ABNORMAL HIGH (ref 26.0–34.0)
MCHC: 33.3 g/dL (ref 30.0–36.0)
MCV: 104.7 fL — ABNORMAL HIGH (ref 78.0–100.0)
Platelets: 161 10*3/uL (ref 150–400)
RBC: 4.24 MIL/uL (ref 4.22–5.81)
RDW: 13.7 % (ref 11.5–15.5)
WBC: 8.8 10*3/uL (ref 4.0–10.5)

## 2014-05-15 LAB — GLUCOSE, CAPILLARY
Glucose-Capillary: 172 mg/dL — ABNORMAL HIGH (ref 70–99)
Glucose-Capillary: 357 mg/dL — ABNORMAL HIGH (ref 70–99)
Glucose-Capillary: 219 mg/dL — ABNORMAL HIGH (ref 70–99)
Glucose-Capillary: 138 mg/dL — ABNORMAL HIGH (ref 70–99)

## 2014-05-15 MED ORDER — AMIODARONE HCL 200 MG PO TABS: 200.0000 mg | ORAL_TABLET | Freq: Every day | ORAL | Status: DC

## 2014-05-15 MED ORDER — ASPIRIN 81 MG PO TBEC: 81.0000 mg | DELAYED_RELEASE_TABLET | Freq: Every day | ORAL | Status: DC

## 2014-05-15 MED ORDER — WARFARIN SODIUM 4 MG PO TABS
4.0000 mg | ORAL_TABLET | Freq: Once | ORAL | Status: AC
Start: 2014-05-15 — End: 2014-05-15
  Administered 2014-05-15: 4 mg via ORAL
  Filled 2014-05-15: qty 1

## 2014-05-15 MED ORDER — PREDNISONE 20 MG PO TABS
40.0000 mg | ORAL_TABLET | Freq: Every day | ORAL | Status: DC
  Administered 2014-05-16: 40 mg via ORAL
  Filled 2014-05-15 (×2): qty 2

## 2014-05-15 MED ORDER — INSULIN ASPART 100 UNIT/ML ~~LOC~~ SOLN
4.0000 [IU] | Freq: Three times a day (TID) | SUBCUTANEOUS | Status: DC
  Administered 2014-05-15: 4 [IU] via SUBCUTANEOUS

## 2014-05-15 MED ORDER — AMIODARONE HCL 400 MG PO TABS: 400.0000 mg | ORAL_TABLET | Freq: Two times a day (BID) | ORAL | Status: DC

## 2014-05-15 MED ORDER — PREDNISONE 10 MG PO TABS: 40.0000 mg | ORAL_TABLET | Freq: Every day | ORAL | Status: DC

## 2014-05-15 NOTE — Progress Notes (Signed)
Inpatient Diabetes Program Recommendations  AACE/ADA: New Consensus Statement on Inpatient Glycemic Control (2013)  Target Ranges:  Prepandial:   less than 140 mg/dL      Peak postprandial:   less than 180 mg/dL (1-2 hours)      Critically ill patients:  140 - 180 mg/dL   Reason for Assessment:  Results for NOLAND, PIZANO (MRN [id number]) as of 05/15/2014 14:06  Ref. Range 05/14/2014 06:44 05/14/2014 10:54 05/14/2014 16:02 05/14/2014 21:01 05/14/2014 23:13 05/15/2014 05:50 05/15/2014 06:08 05/15/2014 11:06  Glucose-Capillary Latest Range: 70-99 mg/dL 234 (H) 313 (H) 240 (H) 407 (H) 409 (H)  172 (H) 357 (H)    Please consider adding Novolog meal coverage 4 units tid with meals (Hold if patient eats less than 50%).  Thanks, Adah Perl, RN, BC-ADM Inpatient Diabetes Coordinator Pager [phone]

## 2014-05-15 NOTE — Discharge Instructions (Signed)

## 2014-05-15 NOTE — Progress Notes (Signed)
Patient Profile: 74 y.o. male, with a past medical history of CABG, end-stage COPD, ETOH abuse, diabetes, peripheral vascular disease, and atrial fibrillation, s/p DCCV January 20. Admitted w/ PAF/RVR & COPD 02/13.  Subjective: Feels better. Breathing improved.   Objective: Vital signs in last 24 hours: Temp:  [97.5 F (36.4 C)-98.4 F (36.9 C)] 97.8 F (36.6 C) (02/18 0414) Pulse Rate:  [75-86] 86 (02/18 0923) Resp:  [16-20] 18 (02/18 0414) BP: (113-168)/(76-85) 139/79 mmHg (02/18 0923) SpO2:  [94 %-96 %] 96 % (02/18 0414) Weight:  [145 lb 9.6 oz (66.044 kg)] 145 lb 9.6 oz (66.044 kg) (02/18 0414) Last BM Date: 05/14/14  Intake/Output from previous day: 02/17 0701 - 02/18 0700 In: 980 [P.O.:980] Out: 0  Intake/Output this shift: Total I/O In: 360 [P.O.:360] Out: -   Medications Current Facility-Administered Medications  Medication Dose Route Frequency Provider Last Rate Last Dose  . acetaminophen (TYLENOL) tablet 650 mg  650 mg Oral Q6H PRN Melton Alar, PA-C       Or  . acetaminophen (TYLENOL) suppository 650 mg  650 mg Rectal Q6H PRN Melton Alar, PA-C      . albuterol (PROVENTIL) (2.5 MG/3ML) 0.083% nebulizer solution 2.5 mg  2.5 mg Nebulization Q2H PRN Jonetta Osgood, MD   2.5 mg at 05/12/14 0118  . alum & mag hydroxide-simeth (MAALOX/MYLANTA) 200-200-20 MG/5ML suspension 30 mL  30 mL Oral Q6H PRN Melton Alar, PA-C      . amiodarone (PACERONE) tablet 400 mg  400 mg Oral BID Evelene Croon Barrett, PA-C   400 mg at 05/15/14 0865  . antiseptic oral rinse (CPC / CETYLPYRIDINIUM CHLORIDE 0.05%) solution 7 mL  7 mL Mouth Rinse BID Jonetta Osgood, MD   7 mL at 05/15/14 1000  . aspirin EC tablet 81 mg  81 mg Oral Daily Melton Alar, PA-C   81 mg at 05/15/14 7846  . budesonide (PULMICORT) nebulizer solution 0.25 mg  0.25 mg Nebulization BID Jonetta Osgood, MD   0.25 mg at 05/15/14 0858  . diltiazem (CARDIZEM CD) 24 hr capsule 180 mg  180 mg Oral Daily Melton Alar, PA-C   180 mg at 05/15/14 9629  . docusate sodium (COLACE) capsule 100 mg  100 mg Oral BID Melton Alar, PA-C   100 mg at 05/15/14 5284  . folic acid (FOLVITE) tablet 1 mg  1 mg Oral Daily Melton Alar, PA-C   1 mg at 05/15/14 1324  . furosemide (LASIX) tablet 40 mg  40 mg Oral Daily Evelene Croon Barrett, PA-C   40 mg at 05/15/14 0917  . guaiFENesin (MUCINEX) 12 hr tablet 600 mg  600 mg Oral BID Jonetta Osgood, MD   600 mg at 05/15/14 0917  . guaiFENesin-dextromethorphan (ROBITUSSIN DM) 100-10 MG/5ML syrup 5 mL  5 mL Oral Q4H PRN Gardiner Barefoot, NP   5 mL at 05/14/14 1850  . insulin aspart (novoLOG) injection 0-15 Units  0-15 Units Subcutaneous TID WC Orson Eva, MD   3 Units at 05/15/14 [phone]  . insulin glargine (LANTUS) injection 15 Units  15 Units Subcutaneous QHS Karsten Fells Kirby-Graham, NP      . ipratropium (ATROVENT) nebulizer solution 0.5 mg  0.5 mg Nebulization TID Jonetta Osgood, MD   0.5 mg at 05/15/14 0853  . levalbuterol (XOPENEX) nebulizer solution 0.63 mg  0.63 mg Nebulization TID Jonetta Osgood, MD   0.63 mg at 05/15/14 0853  . levofloxacin (LEVAQUIN) tablet  500 mg  500 mg Oral Q1500 Jonetta Osgood, MD   500 mg at 05/14/14 1454  . methylPREDNISolone sodium succinate (SOLU-MEDROL) 40 mg/mL injection 40 mg  40 mg Intravenous TID Jonetta Osgood, MD   40 mg at 05/15/14 6295  . multivitamin with minerals tablet 1 tablet  1 tablet Oral Daily Melton Alar, PA-C   1 tablet at 05/15/14 2841  . ondansetron (ZOFRAN) tablet 4 mg  4 mg Oral Q6H PRN Melton Alar, PA-C       Or  . ondansetron [facility]) injection 4 mg  4 mg Intravenous Q6H PRN Melton Alar, PA-C      . oxyCODONE (Oxy IR/ROXICODONE) immediate release tablet 5 mg  5 mg Oral Q4H PRN Melton Alar, PA-C   5 mg at 05/11/14 2120  . pantoprazole (PROTONIX) EC tablet 40 mg  40 mg Oral Daily Melton Alar, PA-C   40 mg at 05/15/14 3244  . pneumococcal 23 valent vaccine (PNU-IMMUNE) injection 0.5 mL  0.5  mL Intramuscular Tomorrow-1000 Annita Brod, MD   0.5 mL at 05/11/14 1146  . rosuvastatin (CRESTOR) tablet 10 mg  10 mg Oral q morning - 10a Melton Alar, PA-C   10 mg at 05/15/14 0102  . sodium chloride 0.9 % injection 3 mL  3 mL Intravenous Q12H Melton Alar, PA-C   3 mL at 05/15/14 7253  . thiamine (VITAMIN B-1) tablet 100 mg  100 mg Oral Daily Melton Alar, PA-C   100 mg at 05/15/14 6644   Or  . thiamine (B-1) injection 100 mg  100 mg Intravenous Daily Melton Alar, PA-C   100 mg at 05/13/14 0347  . vitamin B-12 (CYANOCOBALAMIN) tablet 2,000 mcg  2,000 mcg Oral Daily Melton Alar, PA-C   2,000 mcg at 05/15/14 4259  . Warfarin - Pharmacist Dosing Inpatient   Does not apply [place]        PE: General appearance: alert, cooperative and no distress Neck: no carotid bruit and no JVD Lungs: clear to auscultation bilaterally Heart: irregularly irregular rhythm Extremities: no LEE Pulses: 2+ and symmetric Skin: warm and dry Neurologic: Grossly normal  Lab Results:   Recent Labs  05/15/14 0550  WBC 8.8  HGB 14.8  HCT 44.4  PLT 161   BMET  Recent Labs  05/13/14 0317 05/14/14 0422 05/15/14 0550  NA 137 136 139  K 4.0 4.3 3.5  CL 93* 97 99  CO2 30 35* 34*  GLUCOSE 239* 243* 158*  BUN 27* 23 24*  CREATININE 0.94 1.04 0.92  CALCIUM 8.8 8.5 8.5   PT/INR  Recent Labs  05/13/14 0317 05/14/14 0422 05/15/14 0550  LABPROT 18.0* 19.4* 24.3*  INR 1.48 1.62* 2.16*    Assessment/Plan  Principal Problem:   Acute respiratory distress Active Problems:   Hyperlipidemia   Essential hypertension   CAD with history MI   COPD GOLD III   GERD (gastroesophageal reflux disease)   Respiratory failure with hypercapnia   Atrial fibrillation with RVR   Alcohol dependence   Respiratory distress, acute   Malnutrition of moderate degree   COPD exacerbation   Acute diastolic HF (heart failure)   Acute respiratory failure   Acute on  chronic diastolic CHF (congestive heart failure)   1. Atrial Fibrillation: likely subsequent to acute COPD exacerbation. Still in afib but resting rate well controlled in the 70s. - Continue PO Amiodarone, 400 mg BID x 1week,  400 mg daily x 1 week followed by 200 mg daily.  - Continue dilt CD 180 mg.  - INR is therapeutic at 2.16. Continue warfarin per pharmacy.   2. Essential HTN:  - BP is ok. - Continue current meds. Diltiazem CD 180 mg daily  3. Hyperlipidemia:  - Continue crestor   4. COPD :  - plan per Int. Med.   5. Acute on chronic diastolic heart failure with pulmonary edema - Continue Lasix. Now on home dose of Lasix 40 mg daily. No s/s CHF.   LOS: 5 days    Grisel Blumenstock M. Rosita Fire, PA-C 05/15/2014 12:05 PM

## 2014-05-15 NOTE — Progress Notes (Signed)
PROGRESS NOTE  [name] JHE:[id number] DOB: 08-16-1940 DOA: 05/10/2014 PCP: Garret Reddish, MD   Expand All Collapse All    PATIENT DETAILS Name: [name] Age: 74 y.o. Sex: male Date of Birth: 15-Jan-1941 Admit Date: 05/10/2014 Admitting Physician Annita Brod, MD JEH:[id number], Jeffery Main, MD  Brief narrative:  74 year old gentleman with history of COPD on home O2, history of CAD status post CABG with was admitted with worsening shortness of breath, found to have acute on chronic hypoxic respiratory failure secondary to COPD exacerbation required BiPAP on admission. Also found to have atrial fibrillation with rapid ventricular response. Patient was admitted to a step down unit, cardiology was consulted, he was placed on amiodarone infusion for A. fib with RVR, he was placed on steroids, nebulized bronchodilators and weaned off the BiPAP.  Subjective: Improving slowly. No major issues overnight  Assessment/Plan: Principal Problem:  Acute Hypoxic Resp Failure: Secondary to COPD exacerbation, with component of diastolic CHF contributing. Required BiPAP on admission, weaned off BiPAP and oxygen. Continue steroids-taper Solu-Medrol, bronchodilators and empiric levofloxacin. -presently stable on RA  Active Problems: COPD with exacerbation  -slowly improving -had recent sinus infection -scheduled bronchodilators, empiric Levaquin.  -Moving air much better, still some rhonchi  -transition intravenous Solu-Medrol to po prednisone  Atrial fibrillation with rapid ventricular response:  -Precipitated by COPD exacerbation Seen by cardiology on admission, he recently underwent ardioversion jan 2016  -Started on amiodarone infusion. On chronic anticoagulation with Coumadin-pharmacy dosing while inpatient.  -appreciate cardiology followup -Continue amiodarone--wean as directed by cardiology -Rate  controlled -Continue diltiazem CD   Acute on chronic diastolic heart failure: One dose of IV Lasix ordered by cardiology. Follow. 2/16 Repeat chest x-ray this am-much improved -Continue furosemide 40 mg po daily -Monitor renal function--remains stable -Appears euvolemic at this point  Diabetes mellitus type 2 -Hemoglobin A1c 7.3 -Previously diet controlled -CBGs elevated secondary to steroids -Start Lantus 10 units -change to moderate SSI -add premeal novolog   History of CAD-status post CABG: Minimal elevation in troponin, doubt any significance. Continue aspirin, and statin. Poor beta blocker candidate given ongoing bronchospasm.   Dyslipidemia: Continue with statin.   Alcohol dependence: No signs of any withdrawal. Continue with Ativan per CIWA protocol.   GERD (gastroesophageal reflux disease): Continue with PPI   Malnutrition of moderate degree: Nutrition eval           Family Communication:   Pt at beside Disposition Plan:   SNF 05/16/14 if stable      Procedures/Studies: Dg Chest 2 View  05/13/2014   CLINICAL DATA:  Weakness and shortness of breath, history of Coronary artery disease freely with CABG and stent placement, COPD  EXAM: CHEST  2 VIEW  COMPARISON:  PA and lateral chest x-ray of May 11, 2014  FINDINGS: The lungs are well-expanded. There is no focal infiltrate nor pleural effusion. The cardiac silhouette is top-normal in size. The pulmonary vascularity is not engorged. Subtle nodularity demonstrated in the right upper lung field on yesterday's study is not apparent today. There are post CABG changes. No large pleural effusion is demonstrated. The posterior costophrenic angles are excluded from the study.  IMPRESSION: Improved appearance of the pulmonary interstitium consistent with resolving CHF. Stable changes of COPD. No abnormal pulmonary nodules are evident today.   Electronically Signed   By: Dandrea Medders  [place]   On: 05/13/2014 07:37   X-ray Chest  Pa And Lateral  05/11/2014   CLINICAL DATA:  a wet productive cough.  EXAM: CHEST - 2 VIEW  COMPARISON:  05/10/2014  FINDINGS: Previous CABG. Coarse perihilar and basilar interstitial markings with suggestion of mild central pulmonary vascular congestion. 7 mm nodular opacity in the right upper lobe, not seen previously. Heart size upper limits normal. Atheromatous aorta. No effusion. Moderately severe compression deformity in the mid thoracic spine, stable.  IMPRESSION: 1. New central pulmonary vascular congestion and increased in perihilar and bibasilar interstitial prominence. 2. 7 mm possible right upper lung nodule.  Follow-up recommended.   Electronically Signed   By: Lucrezia Europe M.D.   On: 05/11/2014 12:58   Dg Chest Portable 1 View  05/10/2014   CLINICAL DATA:  Shortness of breath  EXAM: PORTABLE CHEST - 1 VIEW  COMPARISON:  04/03/2014  FINDINGS: Multiple monitoring leads overlie the patient. Stable cardiac and mediastinal contours status post median sternotomy. No consolidative pulmonary opacities. No pleural effusion or pneumothorax. Regional skeleton is unremarkable.  IMPRESSION: No acute cardiopulmonary process.   Electronically Signed   By: Lovey Newcomer M.D.   On: 05/10/2014 08:06         Subjective: Patient is breathing better. He denies any fevers, chest, chest pain, shortness breath, nausea, vomiting, diarrhea, abdominal pain.  Objective: Filed Vitals:   05/15/14 0014 05/15/14 0414 05/15/14 0923 05/15/14 1351  BP: 168/85 152/83 139/79 123/78  Pulse: 79 84 86 82  Temp: 98.2 F (36.8 C) 97.8 F (36.6 C)  97.7 F (36.5 C)  TempSrc: Oral Oral  Oral  Resp: 20 18  18   Height:      Weight:  66.044 kg (145 lb 9.6 oz)    SpO2: 94% 96%  96%    Intake/Output Summary (Last 24 hours) at 05/15/14 1644 Last data filed at 05/15/14 1350  Gross per 24 hour  Intake   1710 ml  Output    300 ml  Net   1410 ml   Weight change: 0.444 kg (15.7 oz) Exam:   General:  Pt is alert,  follows commands appropriately, not in acute distress  HEENT: No icterus, No thrush,  [place]/AT  Cardiovascular: RRR, S1/S2, no rubs, no gallops  Respiratory: Diminished breath sounds with bibasilar crackles. No wheezing. Good air movement  Abdomen: Soft/+BS, non tender, non distended, no guarding  Extremities: No edema, No lymphangitis, No petechiae, No rashes, no synovitis  Data Reviewed: Basic Metabolic Panel:  Recent Labs Lab 05/11/14 0258 05/12/14 0248 05/13/14 0317 05/14/14 0422 05/15/14 0550  NA 138 134* 137 136 139  K 4.1 4.0 4.0 4.3 3.5  CL 97 94* 93* 97 99  CO2 38* 33* 30 35* 34*  GLUCOSE 222* 271* 239* 243* 158*  BUN 14 25* 27* 23 24*  CREATININE 0.92 1.06 0.94 1.04 0.92  CALCIUM 9.0 8.8 8.8 8.5 8.5   Liver Function Tests:  Recent Labs Lab 05/10/14 0718  AST 22  ALT 15  ALKPHOS 44  BILITOT 1.5*  PROT 6.8  ALBUMIN 3.6   No results for input(s): LIPASE, AMYLASE in the last 168 hours. No results for input(s): AMMONIA in the last 168 hours. CBC:  Recent Labs Lab 05/10/14 0718 05/10/14 0724 05/11/14 0258 05/12/14 0248 05/15/14 0550  WBC 10.1  --  8.7 10.3 8.8  NEUTROABS 8.4*  --   --   --   --   HGB 15.1 17.0 14.6 13.7 14.8  HCT 45.4 50.0 45.0 41.5 44.4  MCV 102.7*  --  107.4* 104.3* 104.7*  PLT 146*  --  134* 153 161   Cardiac Enzymes:  Recent Labs Lab 05/10/14 0718 05/10/14 1436 05/10/14 2055 05/11/14 0258  TROPONINI <0.03 0.04* 0.06* <0.03   BNP: Invalid input(s): POCBNP CBG:  Recent Labs Lab 05/14/14 2101 05/14/14 2313 05/15/14 0608 05/15/14 1106 05/15/14 1602  GLUCAP 407* 409* 172* 357* 219*    Recent Results (from the past 240 hour(s))  MRSA PCR Screening     Status: None   Collection Time: 05/10/14  1:03 PM  Result Value Ref Range Status   MRSA by PCR NEGATIVE NEGATIVE Final    Comment:        The GeneXpert MRSA Assay (FDA approved for NASAL specimens only), is one component of a comprehensive MRSA  colonization surveillance program. It is not intended to diagnose MRSA infection nor to guide or monitor treatment for MRSA infections.      Scheduled Meds: . amiodarone  400 mg Oral BID  . antiseptic oral rinse  7 mL Mouth Rinse BID  . aspirin EC  81 mg Oral Daily  . budesonide (PULMICORT) nebulizer solution  0.25 mg Nebulization BID  . diltiazem  180 mg Oral Daily  . docusate sodium  100 mg Oral BID  . folic acid  1 mg Oral Daily  . furosemide  40 mg Oral Daily  . guaiFENesin  600 mg Oral BID  . insulin aspart  0-15 Units Subcutaneous TID WC  . insulin glargine  15 Units Subcutaneous QHS  . ipratropium  0.5 mg Nebulization TID  . levalbuterol  0.63 mg Nebulization TID  . levofloxacin  500 mg Oral Q1500  . multivitamin with minerals  1 tablet Oral Daily  . pantoprazole  40 mg Oral Daily  . pneumococcal 23 valent vaccine  0.5 mL Intramuscular Tomorrow-1000  . [START ON 05/16/2014] predniSONE  40 mg Oral Q breakfast  . rosuvastatin  10 mg Oral q morning - 10a  . sodium chloride  3 mL Intravenous Q12H  . thiamine  100 mg Oral Daily   Or  . thiamine  100 mg Intravenous Daily  . cyanocobalamin  2,000 mcg Oral Daily  . warfarin  4 mg Oral ONCE-1800  . Warfarin - Pharmacist Dosing Inpatient   Does not apply q1800   Continuous Infusions:    Lief Palmatier, DO  [facility] Pager [phone]  If 7PM-7AM, please contact night-coverage [email or web address] Password [email or web address] 05/15/2014, 4:44 PM   LOS: 5 days

## 2014-05-15 NOTE — Discharge Summary (Signed)
Physician Discharge Summary  [name] HUD:[id number] DOB: 1941/02/17 DOA: 05/10/2014  PCP: Garret Reddish, MD  Admit date: 05/10/2014 Discharge date: 05/16/2014 Recommendations for Outpatient Follow-up:  1. Pt will need to follow up with PCP in 2 weeks post discharge 2. Please obtain BMP 05/19/14 3. Please check INR on 05/19/14 and adjust coumadin accordingly for INR 2-3 4. Please keep patient on 2L nasal cannula and wean for oxygen saturation >92% Discharge Diagnoses:  Acute Hypoxic Resp Failure: Secondary to COPD exacerbation, with component of diastolic CHF contributing. Required BiPAP on admission, weaned off BiPAP and oxygen. Continue steroids-taper Solu-Medrol, bronchodilators and empiric levofloxacin. -presently stable on RA -d/c with prednisone taper 40mg , decrease by 10mg  daily -pt had oxygen desaturation to 83% with ambulation-->he will need supplemental oxygen at d/c-->2L  Active Problems: COPD with exacerbation  -slowly improving -had recent sinus infection -scheduled bronchodilators, empiric Levaquin--finished 6 days during hospitalization.  -Moving air much better, still some rhonchi  -transition intravenous Solu-Medrol to po prednisone --d/c with prednisone taper 40mg , decrease by 10mg  daily Atrial fibrillation with rapid ventricular response:  -Precipitated by COPD exacerbation Seen by cardiology on admission, he recently underwent ardioversion jan 2016  -Started on amiodarone infusion. On chronic anticoagulation with Coumadin-pharmacy dosing while inpatient.  -appreciate cardiology followup -Continue amiodarone--wean as directed by cardiology -Rate controlled -Continue diltiazem CD -Amiodarone 400 mg po bid x 3 additional days (will total 7 days), then 400 mg daily x 1 week followed by 200 mg daily -continue coumadin--2.5 mg sun, mon, wed, fri, and sat.  Take 5mg  all other days -check INR on 2/22 and adjust coumadin accordingly Acute on chronic diastolic  heart failure: One dose of IV Lasix ordered by cardiology. Follow. 2/16 Repeat chest x-ray --much improved -Continue furosemide 40 mg po daily -Monitor renal function--remains stable -Appears euvolemic at this point  Diabetes mellitus type 2 -Hemoglobin A1c 7.3 -Previously diet controlled -CBGs elevated secondary to steroids -Start Lantus 10 units during the hospitalization but will not need this after d/c -change to moderate SSI -add premeal novolog--will not need after d/c -as steroids are weaning, do not plan to d/c pt with any insulin at this time   History of CAD-status post CABG: Minimal elevation in troponin, doubt any significance. Continue aspirin, and statin. Poor beta blocker candidate given ongoing bronchospasm.   Dyslipidemia: Continue with statin.   Alcohol dependence: No signs of any withdrawal. Continue with Ativan per CIWA protocol.   GERD (gastroesophageal reflux disease): Continue with PPI   Malnutrition of moderate degree: Nutrition eval  Discharge Condition: stable  Disposition:      Follow-up Information    Follow up with Erlene Quan, PA-C On 05/29/2014.   Specialty:  Cardiology   Why:  10:00 am with Dr. Antionette Char PA for hospital follow-up   Contact information:   [address] [place] [id number] [phone]      SNF--[facility] Diet:heart healthy/carb modified Wt Readings from Last 3 Encounters:  05/16/14 66.724 kg (147 lb 1.6 oz)  04/03/14 67.586 kg (149 lb)  03/19/14 68.947 kg (152 lb)    History of present illness:  74 year old gentleman with history of COPD on home O2, history of CAD status post CABG with was admitted with worsening shortness of breath, found to have acute on chronic hypoxic respiratory failure secondary to COPD exacerbation required BiPAP on admission. Also found to have atrial fibrillation with rapid ventricular response. Patient was admitted to a step down unit, cardiology was consulted, he was  placed on amiodarone infusion for A. fib with RVR, he was placed on steroids, nebulized bronchodilators and weaned off the BiPAP. The patient continued to improve clinically. His intravenous steroids were weaned to oral prednisone. The patient finished 6 days of levofloxacin during the hospitalization. The patient was hyperglycemic secondary to his intravenous steroids. His insulin regimen was adjusted accordingly. He will not be discharged on any insulin at this time as the patient will continue to wean off of prednisone.   Consultants: cardiology  Discharge Exam: Filed Vitals:   05/16/14 0924  BP: 120/70  Pulse: 82  Temp:   Resp:    Filed Vitals:   05/15/14 1351 05/15/14 1949 05/16/14 0531 05/16/14 0924  BP: 123/78 124/67 141/80 120/70  Pulse: 82 81 75 82  Temp: 97.7 F (36.5 C) 97.9 F (36.6 C) 97.8 F (36.6 C)   TempSrc: Oral Oral Oral   Resp: 18 18 18    Height:      Weight:   66.724 kg (147 lb 1.6 oz)   SpO2: 96% 95% 96%    General: A&O x 3, NAD, pleasant, cooperative Cardiovascular: RRR, no rub, no gallop, no S3 Respiratory: Diminished breath sounds with bibasilar crackles. No wheezing. Good air movement. Abdomen:soft, nontender, nondistended, positive bowel sounds Extremities: No edema, No lymphangitis, no petechiae  Discharge Instructions     Medication List    TAKE these medications        amiodarone 400 MG tablet  Commonly known as:  PACERONE  Take 1 tablet (400 mg total) by mouth 2 (two) times daily. X 3 days, then 1 tablet daily x 7 days     amiodarone 200 MG tablet  Commonly known as:  PACERONE  Take 1 tablet (200 mg total) by mouth daily. Start after finished with amiodarone 400mg  tabs     aspirin 81 MG EC tablet  Take 1 tablet (81 mg total) by mouth daily.     CALTRATE 600+D PLUS 600-400 MG-UNIT per tablet  Chew 1 tablet by mouth daily.     CRESTOR 20 MG tablet  Generic drug:  rosuvastatin  TAKE 1/2 TABLET BY MOUTH EVERY MORNING      cyanocobalamin 2000 MCG tablet  Commonly known as:  [employer] VITAMIN B12  Take 1 tablet (2,000 mcg total) by mouth daily.     dextromethorphan-guaiFENesin 30-600 MG per 12 hr tablet  Commonly known as:  MUCINEX DM  Take 1 tablet by mouth every 12 (twelve) hours as needed. cough     diltiazem 180 MG 24 hr capsule  Commonly known as:  CARDIZEM CD  Take 1 capsule (180 mg total) by mouth daily.     DULERA 100-5 MCG/ACT Aero  Generic drug:  mometasone-formoterol  INHALE 2 PUFFS INTO THE LUNGS TWICE A DAY     furosemide 40 MG tablet  Commonly known as:  LASIX  Take 1 tablet (40 mg total) by mouth daily.     pantoprazole 40 MG tablet  Commonly known as:  PROTONIX  TAKE 1 TABLET BY MOUTH TWICE A DAY     potassium chloride SA 20 MEQ tablet  Commonly known as:  K-DUR,KLOR-CON  Take 1 tablet (20 mEq total) by mouth daily.     predniSONE 10 MG tablet  Commonly known as:  DELTASONE  Take 4 tablets (40 mg total) by mouth daily with breakfast. And decrease by 10mg  daily  Start taking on:  05/17/2014     SPIRIVA HANDIHALER 18 MCG inhalation capsule  Generic drug:  tiotropium  INHALE CONTENTS OF 1 CAPSULE DAILY     triamcinolone 0.025 % ointment  Commonly known as:  KENALOG  APPLY 1 A SMALL AMOUNT TO AFFECTED AREA TWICE A DAY     warfarin 5 MG tablet  Commonly known as:  COUMADIN  Take 1 tablet (5 mg total) by mouth daily.     XOPENEX HFA 45 MCG/ACT inhaler  Generic drug:  levalbuterol  INHALE 2 PUFFS INTO THE LUNGS EVERY 4 (FOUR) HOURS AS NEEDED FOR WHEEZING.     ZYRTEC ALLERGY 10 MG tablet  Generic drug:  cetirizine  Take 1 tablet (10 mg total) by mouth every morning.         The results of significant diagnostics from this hospitalization (including imaging, microbiology, ancillary and laboratory) are listed below for reference.    Significant Diagnostic Studies: Dg Chest 2 View  05/13/2014   CLINICAL DATA:  Weakness and shortness of breath, history of Coronary artery disease  freely with CABG and stent placement, COPD  EXAM: CHEST  2 VIEW  COMPARISON:  PA and lateral chest x-ray of May 11, 2014  FINDINGS: The lungs are well-expanded. There is no focal infiltrate nor pleural effusion. The cardiac silhouette is top-normal in size. The pulmonary vascularity is not engorged. Subtle nodularity demonstrated in the right upper lung field on yesterday's study is not apparent today. There are post CABG changes. No large pleural effusion is demonstrated. The posterior costophrenic angles are excluded from the study.  IMPRESSION: Improved appearance of the pulmonary interstitium consistent with resolving CHF. Stable changes of COPD. No abnormal pulmonary nodules are evident today.   Electronically Signed   By: Sade Mehlhoff  [place]   On: 05/13/2014 07:37   X-ray Chest Pa And Lateral  05/11/2014   CLINICAL DATA:  a wet productive cough.  EXAM: CHEST - 2 VIEW  COMPARISON:  05/10/2014  FINDINGS: Previous CABG. Coarse perihilar and basilar interstitial markings with suggestion of mild central pulmonary vascular congestion. 7 mm nodular opacity in the right upper lobe, not seen previously. Heart size upper limits normal. Atheromatous aorta. No effusion. Moderately severe compression deformity in the mid thoracic spine, stable.  IMPRESSION: 1. New central pulmonary vascular congestion and increased in perihilar and bibasilar interstitial prominence. 2. 7 mm possible right upper lung nodule.  Follow-up recommended.   Electronically Signed   By: Lucrezia Europe M.D.   On: 05/11/2014 12:58   Dg Chest Portable 1 View  05/10/2014   CLINICAL DATA:  Shortness of breath  EXAM: PORTABLE CHEST - 1 VIEW  COMPARISON:  04/03/2014  FINDINGS: Multiple monitoring leads overlie the patient. Stable cardiac and mediastinal contours status post median sternotomy. No consolidative pulmonary opacities. No pleural effusion or pneumothorax. Regional skeleton is unremarkable.  IMPRESSION: No acute cardiopulmonary process.    Electronically Signed   By: Lovey Newcomer M.D.   On: 05/10/2014 08:06     Microbiology: Recent Results (from the past 240 hour(s))  MRSA PCR Screening     Status: None   Collection Time: 05/10/14  1:03 PM  Result Value Ref Range Status   MRSA by PCR NEGATIVE NEGATIVE Final    Comment:        The GeneXpert MRSA Assay (FDA approved for NASAL specimens only), is one component of a comprehensive MRSA colonization surveillance program. It is not intended to diagnose MRSA infection nor to guide or monitor treatment for MRSA infections.      Labs: Basic Metabolic Panel:  Recent Labs Lab 05/12/14  1833 05/13/14 0317 05/14/14 0422 05/15/14 0550 05/16/14 0555  NA 134* 137 136 139 137  K 4.0 4.0 4.3 3.5 3.7  CL 94* 93* 97 99 97  CO2 33* 30 35* 34* 35*  GLUCOSE 271* 239* 243* 158* 247*  BUN 25* 27* 23 24* 25*  CREATININE 1.06 0.94 1.04 0.92 0.87  CALCIUM 8.8 8.8 8.5 8.5 8.4  MG  --   --   --   --  2.2   Liver Function Tests:  Recent Labs Lab 05/10/14 0718  AST 22  ALT 15  ALKPHOS 44  BILITOT 1.5*  PROT 6.8  ALBUMIN 3.6   No results for input(s): LIPASE, AMYLASE in the last 168 hours. No results for input(s): AMMONIA in the last 168 hours. CBC:  Recent Labs Lab 05/10/14 0718 05/10/14 0724 05/11/14 0258 05/12/14 0248 05/15/14 0550  WBC 10.1  --  8.7 10.3 8.8  NEUTROABS 8.4*  --   --   --   --   HGB 15.1 17.0 14.6 13.7 14.8  HCT 45.4 50.0 45.0 41.5 44.4  MCV 102.7*  --  107.4* 104.3* 104.7*  PLT 146*  --  134* 153 161   Cardiac Enzymes:  Recent Labs Lab 05/10/14 0718 05/10/14 1436 05/10/14 2055 05/11/14 0258  TROPONINI <0.03 0.04* 0.06* <0.03   BNP: Invalid input(s): POCBNP CBG:  Recent Labs Lab 05/15/14 1106 05/15/14 1602 05/15/14 2052 05/16/14 0616 05/16/14 0630  GLUCAP 357* 219* 138* 223* 241*    Time coordinating discharge:  Greater than 30 minutes  Signed:  Cleland Simkins, DO [facility] Pager: [phone] 05/16/2014, 10:31  AM

## 2014-05-15 NOTE — Progress Notes (Signed)
Physical Therapy Treatment Patient Details Name: [name] MRN: [id number] DOB: 1940-09-21 Today's Date: 05/15/2014    History of Present Illness Acute respiratory failure in the setting of chronic COPD, diastolic heart failure and atrial fibrillation. Pt reported HR up to 300 with SOB on arrival to ED. PMH: alcohol abuse, PVD, CABG, DM, afib with cardioversion, end stage COPD.    PT Comments    Patient ambulated in hall with RW, tolerated there ex well, and continues to remain motivated for functional improvements. Will continue to see and progress as tolerated. VSS, SpO2 90% post activity with HR stable low 80s.   Follow Up Recommendations  SNF     Equipment Recommendations  Other (comment) (rollator)    Recommendations for Other Services       Precautions / Restrictions Precautions Precautions: Fall    Mobility  Bed Mobility Overal bed mobility: Modified Independent             General bed mobility comments: HOB elevated  Transfers Overall transfer level: Needs assistance Equipment used: Rolling walker (2 wheeled);None Transfers: Sit to/from Stand Sit to Stand: Supervision Stand pivot transfers: Supervision          Ambulation/Gait Ambulation/Gait assistance: Min guard;Min assist Ambulation Distance (Feet): 120 Feet (x2 with standing rest break ) Assistive device: Rolling walker (2 wheeled) Gait Pattern/deviations: Step-through pattern;Decreased step length - right;Decreased step length - left;Trunk flexed   Gait velocity interpretation: Below normal speed for age/gender General Gait Details: VCs for safety and use of RW, cues for upright posture   Stairs            Wheelchair Mobility    Modified Rankin (Stroke Patients Only)       Balance     Sitting balance-Leahy Scale: Good       Standing balance-Leahy Scale: Fair                      Cognition Arousal/Alertness: Awake/alert Behavior During Therapy: WFL for tasks  assessed/performed   Area of Impairment: Safety/judgement         Safety/Judgement: Decreased awareness of safety;Decreased awareness of deficits          Exercises General Exercises - Lower Extremity Ankle Circles/Pumps: AROM;Both;10 reps Long Arc Quad: AROM;Strengthening;Both;10 reps Hip Flexion/Marching: AROM;Strengthening;Both;10 reps Mini-Sqauts: AROM;10 reps    General Comments        Pertinent Vitals/Pain Pain Assessment: No/denies pain    Home Living                      Prior Function            PT Goals (current goals can now be found in the care plan section) Acute Rehab PT Goals Patient Stated Goal: to get back home PT Goal Formulation: With patient/family Time For Goal Achievement: 05/25/14 Potential to Achieve Goals: Fair Progress towards PT goals: Progressing toward goals    Frequency  Min 3X/week    PT Plan Current plan remains appropriate    Co-evaluation             End of Session Equipment Utilized During Treatment: Gait belt Activity Tolerance: Patient tolerated treatment well Patient left: in bed;with call bell/phone within reach;with family/visitor present (sitting EOB)     Time: 8527-7824 PT Time Calculation (min) (ACUTE ONLY): 25 min  Charges:  $Gait Training: 8-22 mins $Therapeutic Exercise: 8-22 mins  G CodesDuncan Dull June 02, 2014, 3:59 PM Alben Deeds, [place] DPT  [phone]

## 2014-05-15 NOTE — Progress Notes (Signed)
ANTICOAGULATION CONSULT NOTE - Follow Up Consult  Pharmacy Consult for Coumadin Indication: atrial fibrillation  Allergies  Allergen Reactions  . Xarelto [Rivaroxaban] Other (See Comments)    makes pt crazy  . Ticlopidine Hcl Swelling    Patient Measurements: Height: 5\' 5"  (165.1 cm) Weight: 145 lb 9.6 oz (66.044 kg) IBW/kg (Calculated) : 61.5  Vital Signs: Temp: 97.7 F (36.5 C) (02/18 1351) Temp Source: Oral (02/18 1351) BP: 123/78 mmHg (02/18 1351) Pulse Rate: 82 (02/18 1351)  Labs:  Recent Labs  05/13/14 0317 05/14/14 0422 05/15/14 0550  HGB  --   --  14.8  HCT  --   --  44.4  PLT  --   --  161  LABPROT 18.0* 19.4* 24.3*  INR 1.48 1.62* 2.16*  CREATININE 0.94 1.04 0.92    Estimated Creatinine Clearance: 61.3 mL/min (by C-G formula based on Cr of 0.92).  Assessment:   INR is now therapeutic (2.16) after falling subtherapeutic for several days.  Day # 6 Amio and Levaquin. Has had Coumadin 6 mg daily x 3 days. Had expected to decrease dose from home regimen with the addition of 2 meds that can effect INR.   Home Coumadin dose: 2.5 mg daily except 5 mg on Tuesday and Thursday (22.5 mg/week).   INR 2.17 on admit 05/10/14.   Goal of Therapy:  INR 2-3 Monitor platelets by anticoagulation protocol: Yes   Plan:   Coumadin 4 mg x 1 today.  Daily PT/INR.  Expect long-term Coumadin dose to be lower that pre-admit regimen with ongoing Amiodarone, but has been needing more Coumadin during Amio initiation.  Coumadin requirement may also change once Levaquin course is completed.  Discussed dosing variation with patient and son.   If discharged to SNF on 05/16/14, will need PT/INR next week.  Arty Baumgartner, [place] Pager: [phone] 05/15/2014,2:55 PM

## 2014-05-16 LAB — BASIC METABOLIC PANEL
Anion gap: 5 (ref 5–15)
BUN: 25 mg/dL — AB (ref 6–23)
CALCIUM: 8.4 mg/dL (ref 8.4–10.5)
CO2: 35 mmol/L — ABNORMAL HIGH (ref 19–32)
Chloride: 97 mmol/L (ref 96–112)
Creatinine, Ser: 0.87 mg/dL (ref 0.50–1.35)
GFR, EST NON AFRICAN AMERICAN: 83 mL/min — AB (ref >90)
Glucose, Bld: 247 mg/dL — ABNORMAL HIGH (ref 70–99)
Potassium: 3.7 mmol/L (ref 3.5–5.1)
Sodium: 137 mmol/L (ref 135–145)

## 2014-05-16 LAB — GLUCOSE, CAPILLARY
Glucose-Capillary: 223 mg/dL — ABNORMAL HIGH (ref 70–99)
Glucose-Capillary: 241 mg/dL — ABNORMAL HIGH (ref 70–99)
Glucose-Capillary: 318 mg/dL — ABNORMAL HIGH (ref 70–99)

## 2014-05-16 LAB — PROTIME-INR
INR: 2.26 — ABNORMAL HIGH (ref 0.00–1.49)
Prothrombin Time: 25.2 seconds — ABNORMAL HIGH (ref 11.6–15.2)

## 2014-05-16 LAB — MAGNESIUM: Magnesium: 2.2 mg/dL (ref 1.5–2.5)

## 2014-05-16 MED ORDER — WARFARIN SODIUM 2 MG PO TABS
2.0000 mg | ORAL_TABLET | Freq: Once | ORAL | Status: DC
  Filled 2014-05-16: qty 1

## 2014-05-16 NOTE — Progress Notes (Signed)
Report called to Lincoln Brigham, receiving nurse at [facility]. All questions were answered.[name] present at  Bedside transporting patient to facility.

## 2014-05-16 NOTE — Progress Notes (Signed)
SATURATION QUALIFICATIONS: (This note is used to comply with regulatory documentation for home oxygen)  Patient Saturations on Room Air at Rest = 97%  Patient Saturations on Room Air while Ambulating = 83%  Patient Saturations on 0 Liters of oxygen while Ambulating = 83%  Please briefly explain why patient needs home oxygen:  Pt drops to 83% oxygen saturation while ambulating 148ft in hallway.  When back in room and resting, oxygen saturation bounces up to 97% in less than a minute of resting. Graceann Congress

## 2014-05-16 NOTE — Progress Notes (Signed)
ANTICOAGULATION CONSULT NOTE - Follow Up Consult  Pharmacy Consult for Coumadin Indication: atrial fibrillation  Allergies  Allergen Reactions  . Xarelto [Rivaroxaban] Other (See Comments)    makes pt crazy  . Ticlopidine Hcl Swelling  . Lorazepam     Altered mental status from 2 doses of IV Ativan on 05/10/14.    Patient Measurements: Height: 5\' 5"  (165.1 cm) Weight: 147 lb 1.6 oz (66.724 kg) IBW/kg (Calculated) : 61.5  Vital Signs: Temp: 97.8 F (36.6 C) (02/19 0531) Temp Source: Oral (02/19 0531) BP: 141/80 mmHg (02/19 0531) Pulse Rate: 75 (02/19 0531)  Labs:  Recent Labs  05/14/14 0422 05/15/14 0550 05/16/14 0555  HGB  --  14.8  --   HCT  --  44.4  --   PLT  --  161  --   LABPROT 19.4* 24.3* 25.2*  INR 1.62* 2.16* 2.26*  CREATININE 1.04 0.92 0.87    Estimated Creatinine Clearance: 64.8 mL/min (by C-G formula based on Cr of 0.87).  Assessment 74 you M admitted 05/10/2014  increased SOB pharmacy consulted to dose heparin for afib.    PMH: AFib, DCCV 1/16  AC Afib, INR 2.17 on admit (2.5 mg/d x 5 mg TTh (22.5 mg/wk)). Started amio2/13 so will need weekly dose decr, also on Levaquin. INR at goal, relatively rapid rise over last 48h.  Goal of Therapy:  INR 2-3 Monitor platelets by anticoagulation protocol: Yes   Plan:  Coumadin 2 mg x 1 today. Daily PT/INR while inpatient,  Expect long-term Coumadin dose to be lower that pre-admit regimen with ongoing Amiodarone, but has been needing more Coumadin during Amio initiation. Coumadin requirement may also change once Levaquin course is completed.  Discussed dosing variation with patient and son.   If discharged to SNF on 05/16/14, will need PT/INR next week.  Thank you for allowing pharmacy to be a part of this patients care team.  Rowe Robert Pharm.D., BCPS, AQ-Cardiology Clinical Pharmacist 05/16/2014 8:56 AM Pager: [phone] Phone: [phone]

## 2014-05-16 NOTE — Clinical Social Work Placement (Signed)
     Clinical Social Work Department CLINICAL SOCIAL WORK PLACEMENT NOTE 05/16/2014  Patient:  [name], [name]  Account Number:  [id number] Admit date:  05/10/2014  Clinical Social Worker:  Domenica Reamer, CLINICAL SOCIAL WORKER  Date/time:  05/12/2014 02:09 PM  Clinical Social Work is seeking post-discharge placement for this patient at the following level of care:   SKILLED NURSING   (*CSW will update this form in Epic as items are completed)   05/12/2014  Patient/family provided with [place] Department of Clinical Social Works list of facilities offering this level of care within the geographic area requested by the patient (or if unable, by the patients family).  05/12/2014  Patient/family informed of their freedom to choose among providers that offer the needed level of care, that participate in Medicare, Medicaid or managed care program needed by the patient, have an available bed and are willing to accept the patient.  05/12/2014  Patient/family informed of [facility] ownership interest in [facility], as well as of the fact that they are under no obligation to receive care at this facility.  PASARR submitted to EDS on 05/12/2014 PASARR number received on 05/12/2014  FL2 transmitted to all facilities in geographic area requested by pt/family on  05/12/2014 FL2 transmitted to all facilities within larger geographic area on   Patient informed that his/her managed care company has contracts with or will negotiate with  certain facilities, including the following:   NA     Patient/family informed of bed offers received:  05/15/2014 Patient chooses bed at [place], [facility] Physician recommends and patient chooses bed at    Patient to be transferred to [place], [facility] on  05/16/2014 Patient to be transferred to facility by Car- with Son Patient and family notified of transfer on 05/16/2014 Name of family member notified:  [name],  [name].  The following physician request were entered in Epic: Physician Request  Please sign FL2.  Please prepare priority discharge summary and prescriptions.    Additional Comments: 05/16/14  Ok per MD for d/c today to SNF for short term rehab.  Patient's son [name]. wants to transport patient via car.  Patient up and dressed; he was very excited to be leaving and anxious to go to the SNF.  Son was pleased with d/c plan and denied any questions or concerns.  Nursing called report to SNF. CSW signing off.  Lorie Phenix. [place], [place]

## 2014-05-17 ENCOUNTER — Other Ambulatory Visit: Payer: Self-pay | Admitting: Internal Medicine

## 2014-05-21 DIAGNOSIS — B379 Candidiasis, unspecified: Secondary | ICD-10-CM | POA: Diagnosis not present

## 2014-05-29 ENCOUNTER — Ambulatory Visit (INDEPENDENT_AMBULATORY_CARE_PROVIDER_SITE_OTHER): Payer: Medicare Other | Admitting: Cardiology

## 2014-05-29 ENCOUNTER — Encounter: Payer: Self-pay | Admitting: Cardiology

## 2014-05-29 ENCOUNTER — Ambulatory Visit (INDEPENDENT_AMBULATORY_CARE_PROVIDER_SITE_OTHER): Payer: Medicare Other | Admitting: *Deleted

## 2014-05-29 VITALS — BP 144/88 | HR 75 | Ht 64.0 in | Wt 147.4 lb

## 2014-05-29 DIAGNOSIS — I4891 Unspecified atrial fibrillation: Secondary | ICD-10-CM | POA: Diagnosis not present

## 2014-05-29 DIAGNOSIS — Z7901 Long term (current) use of anticoagulants: Secondary | ICD-10-CM | POA: Diagnosis not present

## 2014-05-29 DIAGNOSIS — I4819 Other persistent atrial fibrillation: Secondary | ICD-10-CM

## 2014-05-29 DIAGNOSIS — I10 Essential (primary) hypertension: Secondary | ICD-10-CM | POA: Diagnosis not present

## 2014-05-29 DIAGNOSIS — J449 Chronic obstructive pulmonary disease, unspecified: Secondary | ICD-10-CM

## 2014-05-29 DIAGNOSIS — I481 Persistent atrial fibrillation: Secondary | ICD-10-CM | POA: Diagnosis not present

## 2014-05-29 DIAGNOSIS — J9601 Acute respiratory failure with hypoxia: Secondary | ICD-10-CM

## 2014-05-29 DIAGNOSIS — Z951 Presence of aortocoronary bypass graft: Secondary | ICD-10-CM

## 2014-05-29 LAB — BASIC METABOLIC PANEL
BUN: 13 mg/dL (ref 6–23)
CO2: 30 mEq/L (ref 19–32)
Calcium: 9 mg/dL (ref 8.4–10.5)
Chloride: 98 mEq/L (ref 96–112)
Creatinine, Ser: 1.1 mg/dL (ref 0.40–1.50)
GFR: 69.53 mL/min (ref >60.00)
Glucose, Bld: 200 mg/dL — ABNORMAL HIGH (ref 70–99)
Potassium: 4 mEq/L (ref 3.5–5.1)
Sodium: 137 mEq/L (ref 135–145)

## 2014-05-29 LAB — POCT INR: INR: 3.3

## 2014-05-29 NOTE — Assessment & Plan Note (Signed)
CABG x5 2012, 6 stents (before)

## 2014-05-29 NOTE — Assessment & Plan Note (Signed)
He is now off O2

## 2014-05-29 NOTE — Assessment & Plan Note (Signed)
Controlled.  

## 2014-05-29 NOTE — Progress Notes (Signed)
05/29/2014 [name]   March 18, 1941  [id number]  Primary Physician Garret Reddish, MD Primary Cardiologist: Dr Burt Knack  HPI:  74 y.o. Male followed by Dr Burt Knack, with a past medical history of CABG 2011, end-stage COPD, ETOH abuse- 1 fifth a week, diabetes, peripheral vascular disease with prior iliac stenting, and atrial fibrillation on Coumadin, s/p underwent cardioversion on January 20. He did well until 05/10/14 when he went back into AF with RVR. This was associated with acute on chronic respiratory failure, it's not clear which came first. He was stabilized and placed on Amiodarone loading. He was discharged to SNF on O2 for rehab. He is in the office today for follow up. He is doing better and is off O2. He still has more DOE than is usual for him. He is in AF with CVR. He says his Coumadin level has not been checked since discharge from the hospital. He is supposed to be discharged from SNF tomorrow but he and his son wanted to know if he could go today.    Current Outpatient Prescriptions  Medication Sig Dispense Refill  . amiodarone (PACERONE) 200 MG tablet Take 1 tablet (200 mg total) by mouth daily. Start after finished with amiodarone 400mg  tabs 30 tablet 0  . aspirin EC 81 MG EC tablet Take 1 tablet (81 mg total) by mouth daily. 30 tablet 1  . Calcium Carbonate-Vit D-Min (CALTRATE 600+D PLUS) 600-400 MG-UNIT per tablet Chew 1 tablet by mouth daily.      . cetirizine (ZYRTEC ALLERGY) 10 MG tablet Take 1 tablet (10 mg total) by mouth every morning.    Marland Kitchen CRESTOR 20 MG tablet TAKE 1/2 TABLET BY MOUTH EVERY MORNING (Patient taking differently: Take 10 mg by mouth every morning) 30 tablet 2  . cyanocobalamin ([employer] VITAMIN B12) 2000 MCG tablet Take 1 tablet (2,000 mcg total) by mouth daily.    Marland Kitchen dextromethorphan-guaiFENesin (MUCINEX DM) 30-600 MG per 12 hr tablet Take 1 tablet by mouth every 12 (twelve) hours as needed. cough     . diltiazem (CARDIZEM CD) 180 MG 24 hr capsule Take 1  capsule (180 mg total) by mouth daily. 90 capsule 3  . DULERA 100-5 MCG/ACT AERO INHALE 2 PUFFS INTO THE LUNGS TWICE A DAY (Patient taking differently: Inhale 2 puffs into the lungs twice daily) 13 g 2  . furosemide (LASIX) 40 MG tablet Take 1 tablet (40 mg total) by mouth daily. 30 tablet 3  . pantoprazole (PROTONIX) 40 MG tablet TAKE 1 TABLET BY MOUTH TWICE A DAY 180 tablet 1  . potassium chloride SA (K-DUR,KLOR-CON) 20 MEQ tablet Take 1 tablet (20 mEq total) by mouth daily. 30 tablet 3  . SPIRIVA HANDIHALER 18 MCG inhalation capsule INHALE CONTENTS OF 1 CAPSULE DAILY 30 capsule 0  . triamcinolone (KENALOG) 0.025 % ointment APPLY 1 A SMALL AMOUNT TO AFFECTED AREA TWICE A DAY (Patient taking differently: Apply a small amount to affected area twice daily as needed for rash) 80 g 2  . warfarin (COUMADIN) 5 MG tablet Take 1 tablet (5 mg total) by mouth daily. (Patient taking differently: Take 2.5-5 mg by mouth daily. Take 2.5 mg by mouth on Sunday, Monday, Wednesday,Friday and Saturday . Take 5 mg by mouth on all other days.) 30 tablet 6  . XOPENEX HFA 45 MCG/ACT inhaler INHALE 2 PUFFS INTO THE LUNGS EVERY 4 (FOUR) HOURS AS NEEDED FOR WHEEZING. 15 Inhaler 2   No current facility-administered medications for this visit.  Allergies  Allergen Reactions  . Xarelto [Rivaroxaban] Other (See Comments)    makes pt crazy  . Ticlopidine Hcl Swelling  . Lorazepam     Altered mental status from 2 doses of IV Ativan on 05/10/14.    History   Social History  . Marital Status: Widowed    Spouse Name: N/A  . Number of Children: N/A  . Years of Education: N/A   Occupational History  . retired from [employer] and [employer]  . Smoking status: Former Smoker -- 3.00 packs/day for 50 years    Types: Cigarettes    Quit date: 09/25/2009  . Smokeless tobacco: Never Used  . Alcohol Use: 3.0 oz/week    6 Standard drinks or equivalent per week     Comment: Bourbon 2-3 a night  .  Drug Use: No  . Sexual Activity: Not on file   Other Topics Concern  . Not on file   Social History Narrative   Widower in 2009. 3 kids. 4 grandkids. No greatgrandkids. No pets.    Lives with Sharyl Nimrod, brother in law.    Drives [name] to the store who shops for groceries. Prepares all of own food together.    ADLS independent. Son Ralpheal helps with finances.       Advance-does not want resuscitation.    HCPOA-no      Retired from [employer] and [employer].       Hobbies: former Air cabin crew, used to fish, tv-old westerns, reading     Review of Systems: General: negative for chills, fever, night sweats or weight changes.  Cardiovascular: negative for chest pain, dyspnea on exertion, edema, orthopnea, palpitations, paroxysmal nocturnal dyspnea or shortness of breath Dermatological: negative for rash Respiratory: negative for cough or wheezing Urologic: negative for hematuria Abdominal: negative for nausea, vomiting, diarrhea, bright red blood per rectum, melena, or hematemesis Neurologic: negative for visual changes, syncope, or dizziness All other systems reviewed and are otherwise negative except as noted above.    Blood pressure 144/88, pulse 75, height 5\' 4"  (1.626 m), weight 147 lb 6.4 oz (66.86 kg).  General appearance: alert, cooperative, no distress and moderately obese Neck: no carotid bruit and no JVD Lungs: decreased breath sounds, no wheezing Heart: irregularly irregular rhythm Extremities: no edema Skin: pale cool dry Neurologic: Grossly normal  EKG AF with CVR  ASSESSMENT AND PLAN:   Acute respiratory failure Just discharged 05/15/14 after acute on chronic respiratory failure    Atrial fibrillation DCCV in Jan, recurrent AF with recent admission. Amiodarone added.    COPD GOLD III He is now off O2   Essential hypertension Controlled   Chronic anticoagulation Coumadin   Hx of CABG CABG x5 2012, 6 stents (before)    PLAN  I discussed Mr [name] situation  with Dr Burt Knack. We will check his INR today and every week x 4 weeks, then plan OP DCCV. Dr Burt Knack would like to see him in the office before this.   [name] KPA-C 05/29/2014 1:53 PM

## 2014-05-29 NOTE — Assessment & Plan Note (Signed)
Just discharged 05/15/14 after acute on chronic respiratory failure

## 2014-05-29 NOTE — Assessment & Plan Note (Signed)
DCCV in Jan, recurrent AF with recent admission. Amiodarone added.

## 2014-05-29 NOTE — Assessment & Plan Note (Signed)
Coumadin 

## 2014-05-29 NOTE — Patient Instructions (Addendum)
Your physician recommends that you continue on your current medications as directed. Please refer to the Current Medication list given to you today.  Your physician recommends that you return for lab work in: today Artist)   You need to have your Coumadin level checked today and once a week for 4 weeks.  Your physician has recommended that you have a Cardioversion (DCCV). Electrical Cardioversion uses a jolt of electricity to your heart either through paddles or wired patches attached to your chest. This is a controlled, usually prescheduled, procedure. Defibrillation is done under light anesthesia in the hospital, and you usually go home the day of the procedure. This is done to get your heart back into a normal rhythm. You are not awake for the procedure. Please see the instruction sheet given to you today.   Per Kerin Ransom, PAC it is okay to be discharged from the nursing home today rather than tomorrow.

## 2014-06-02 ENCOUNTER — Ambulatory Visit (INDEPENDENT_AMBULATORY_CARE_PROVIDER_SITE_OTHER): Payer: Medicare Other | Admitting: *Deleted

## 2014-06-02 ENCOUNTER — Telehealth: Payer: Self-pay | Admitting: Internal Medicine

## 2014-06-02 DIAGNOSIS — I4891 Unspecified atrial fibrillation: Secondary | ICD-10-CM | POA: Diagnosis not present

## 2014-06-02 DIAGNOSIS — I481 Persistent atrial fibrillation: Secondary | ICD-10-CM

## 2014-06-02 DIAGNOSIS — I4819 Other persistent atrial fibrillation: Secondary | ICD-10-CM

## 2014-06-02 LAB — POCT INR: INR: 1.5

## 2014-06-02 NOTE — Telephone Encounter (Signed)
[name] called in statin that she took pt's INR today and it was 1.5 so she increased his dosage . She just wanted Dr. Debara Pickett to be aware.   Thanks

## 2014-06-02 NOTE — Telephone Encounter (Signed)
We'll need to monitor this .Marland Kitchen May have to push back cardioversion if he is not therapeutic.  DR. Lemmie Evens

## 2014-06-02 NOTE — Telephone Encounter (Signed)
[name] - please keep [place] updated on patient's INR results per Dr. Lysbeth Penner recommendation that cardioversion may need to be moved if has inconsistently sub-therapeutc INRs

## 2014-06-09 ENCOUNTER — Ambulatory Visit (INDEPENDENT_AMBULATORY_CARE_PROVIDER_SITE_OTHER): Payer: Medicare Other | Admitting: *Deleted

## 2014-06-09 DIAGNOSIS — I4819 Other persistent atrial fibrillation: Secondary | ICD-10-CM

## 2014-06-09 DIAGNOSIS — I4891 Unspecified atrial fibrillation: Secondary | ICD-10-CM

## 2014-06-09 DIAGNOSIS — I481 Persistent atrial fibrillation: Secondary | ICD-10-CM

## 2014-06-09 LAB — POCT INR: INR: 1.5

## 2014-06-11 ENCOUNTER — Ambulatory Visit: Payer: Medicare Other | Admitting: Cardiovascular Disease

## 2014-06-16 ENCOUNTER — Ambulatory Visit: Payer: Medicare Other | Admitting: Cardiovascular Disease

## 2014-06-16 ENCOUNTER — Ambulatory Visit (INDEPENDENT_AMBULATORY_CARE_PROVIDER_SITE_OTHER): Payer: Medicare Other | Admitting: *Deleted

## 2014-06-16 DIAGNOSIS — I481 Persistent atrial fibrillation: Secondary | ICD-10-CM | POA: Diagnosis not present

## 2014-06-16 DIAGNOSIS — I4891 Unspecified atrial fibrillation: Secondary | ICD-10-CM | POA: Diagnosis not present

## 2014-06-16 DIAGNOSIS — I4819 Other persistent atrial fibrillation: Secondary | ICD-10-CM

## 2014-06-16 LAB — POCT INR: INR: 2.5

## 2014-06-23 ENCOUNTER — Ambulatory Visit (INDEPENDENT_AMBULATORY_CARE_PROVIDER_SITE_OTHER): Payer: Medicare Other

## 2014-06-23 ENCOUNTER — Ambulatory Visit (HOSPITAL_COMMUNITY): Admission: RE | Admit: 2014-06-23 | Payer: Medicare Other | Source: Ambulatory Visit | Admitting: Internal Medicine

## 2014-06-23 ENCOUNTER — Encounter (HOSPITAL_COMMUNITY): Admission: RE | Payer: Self-pay | Source: Ambulatory Visit

## 2014-06-23 DIAGNOSIS — I481 Persistent atrial fibrillation: Secondary | ICD-10-CM

## 2014-06-23 DIAGNOSIS — I4819 Other persistent atrial fibrillation: Secondary | ICD-10-CM

## 2014-06-23 DIAGNOSIS — I4891 Unspecified atrial fibrillation: Secondary | ICD-10-CM | POA: Diagnosis not present

## 2014-06-23 LAB — PROTIME-INR
INR: 6.7 ratio — AB (ref 0.8–1.0)
Prothrombin Time: 70.5 s (ref 9.6–13.1)

## 2014-06-23 LAB — POCT INR: INR: 6.4

## 2014-06-23 SURGERY — CARDIOVERSION
Anesthesia: Monitor Anesthesia Care

## 2014-06-24 ENCOUNTER — Encounter: Payer: Self-pay | Admitting: Family Medicine

## 2014-06-24 ENCOUNTER — Ambulatory Visit (INDEPENDENT_AMBULATORY_CARE_PROVIDER_SITE_OTHER): Payer: Medicare Other | Admitting: Family Medicine

## 2014-06-24 VITALS — BP 122/84 | HR 88 | Temp 97.8°F | Wt 153.0 lb

## 2014-06-24 DIAGNOSIS — I5032 Chronic diastolic (congestive) heart failure: Secondary | ICD-10-CM | POA: Diagnosis not present

## 2014-06-24 DIAGNOSIS — R0601 Orthopnea: Secondary | ICD-10-CM | POA: Diagnosis not present

## 2014-06-24 DIAGNOSIS — M7022 Olecranon bursitis, left elbow: Secondary | ICD-10-CM | POA: Diagnosis not present

## 2014-06-24 DIAGNOSIS — R0602 Shortness of breath: Secondary | ICD-10-CM | POA: Diagnosis not present

## 2014-06-24 DIAGNOSIS — R0683 Snoring: Secondary | ICD-10-CM

## 2014-06-24 NOTE — Progress Notes (Signed)
Garret Reddish, MD Phone: [phone]  Subjective:   [name] is a 74 y.o. year old very pleasant male patient who presents with the following:  L olecranon bursitis -INR >6 yesterday, Left olecranon bursa inflammed this morning. Moderate aching pain. Not worsening since he woke up. No treatments tried yet. Denies trauma. Bruise on underside of his arm and along the bursa itself though.  ROS- no fever, chills. No expanding redness.   Heavy Snoring/shortness of breath/orthopnea Diastolic CHF- ? worsening Last month has noted sleeping on 2 instead of 1 pillows. Wakes up short of breath and sitting up helps. Sats have generally been 87-93% at that time. Taking lasix daily. Wants sleep study.  ROS- no chest pain or shortness of breath the daytime  Past Medical History- Patient Active Problem List   Diagnosis Date Noted  . Diastolic CHF 91/63/8466    Priority: High  . Do not resuscitate 02/06/2014    Priority: High  . Atrial fibrillation 11/06/2009    Priority: High  . Peripheral vascular disease 09/02/2009    Priority: Medium  . Carotid artery stenosis 04/16/2009    Priority: Medium  . COPD GOLD III 05/21/2007    Priority: Medium  . TOBACCO USE 01/19/2007    Priority: Medium  . Hyperlipidemia 11/21/2006    Priority: Medium  . Essential hypertension 11/21/2006    Priority: Medium  . Hx of CABG 11/21/2006    Priority: Medium  . Osteoporosis 11/21/2006    Priority: Medium  . Chronic anticoagulation 05/29/2014    Priority: Low  . CAP (community acquired pneumonia) 04/03/2014    Priority: Low  . Allergic rhinitis 02/06/2014    Priority: Low  . GERD (gastroesophageal reflux disease) 02/06/2014    Priority: Low  . Erectile dysfunction 06/18/2010    Priority: Low  . ANEMIA, B12 DEFICIENCY 12/02/2008    Priority: Low   Medications- reviewed and updated Current Outpatient Prescriptions  Medication Sig Dispense Refill  . amiodarone (PACERONE) 200 MG tablet Take 1  tablet (200 mg total) by mouth daily. Start after finished with amiodarone 400mg  tabs 30 tablet 0  . aspirin EC 81 MG EC tablet Take 1 tablet (81 mg total) by mouth daily. 30 tablet 1  . Calcium Carbonate-Vit D-Min (CALTRATE 600+D PLUS) 600-400 MG-UNIT per tablet Chew 1 tablet by mouth daily.      . cetirizine (ZYRTEC ALLERGY) 10 MG tablet Take 1 tablet (10 mg total) by mouth every morning.    Marland Kitchen CRESTOR 20 MG tablet TAKE 1/2 TABLET BY MOUTH EVERY MORNING (Patient taking differently: Take 10 mg by mouth every morning) 30 tablet 2  . cyanocobalamin ([employer]S VITAMIN B12) 2000 MCG tablet Take 1 tablet (2,000 mcg total) by mouth daily.    Marland Kitchen diltiazem (CARDIZEM CD) 180 MG 24 hr capsule Take 1 capsule (180 mg total) by mouth daily. 90 capsule 3  . DULERA 100-5 MCG/ACT AERO INHALE 2 PUFFS INTO THE LUNGS TWICE A DAY (Patient taking differently: Inhale 2 puffs into the lungs twice daily) 13 g 2  . furosemide (LASIX) 40 MG tablet Take 1 tablet (40 mg total) by mouth daily. 30 tablet 3  . pantoprazole (PROTONIX) 40 MG tablet TAKE 1 TABLET BY MOUTH TWICE A DAY 180 tablet 1  . potassium chloride SA (K-DUR,KLOR-CON) 20 MEQ tablet Take 1 tablet (20 mEq total) by mouth daily. 30 tablet 3  . SPIRIVA HANDIHALER 18 MCG inhalation capsule INHALE CONTENTS OF 1 CAPSULE DAILY 30 capsule 0  . warfarin (COUMADIN)  5 MG tablet Take 1 tablet (5 mg total) by mouth daily. (Patient taking differently: Take 2.5-5 mg by mouth daily. Take 2.5 mg by mouth on Sunday, Monday, Wednesday,Friday and Saturday . Take 5 mg by mouth on all other days.) 30 tablet 6  . XOPENEX HFA 45 MCG/ACT inhaler INHALE 2 PUFFS INTO THE LUNGS EVERY 4 (FOUR) HOURS AS NEEDED FOR WHEEZING. 15 Inhaler 2  . dextromethorphan-guaiFENesin (MUCINEX DM) 30-600 MG per 12 hr tablet Take 1 tablet by mouth every 12 (twelve) hours as needed. cough     . triamcinolone (KENALOG) 0.025 % ointment APPLY 1 A SMALL AMOUNT TO AFFECTED AREA TWICE A DAY (Patient not taking: Reported on  06/24/2014) 80 g 2   No current facility-administered medications for this visit.    Objective: BP 122/84 mmHg  Pulse 88  Temp(Src) 97.8 F (36.6 C)  Wt 153 lb (69.4 kg)  SpO2 92% Gen: NAD, resting comfortably CV: RRR no murmurs rubs or gallops Lungs: CTAB no crackles, wheeze, rhonchi Abdomen: soft/nontender/nondistended/normal bowel sounds.  Ext: R elbow normal, L elbow olecranon bursa with prominent fluid collection raised 2-3 cm. Bruising on most distal area. Bruising also left upper forearm.  Neuro: intact distal sensation and grip strength   Assessment/Plan:  L olecranon bursitis May be hemorrhagic with INR >6. Do not think aspiration likely beneficial. Nsaids not best idea on coumadin. We will treat conservatively with icing 3 times a day. If it persisted the next week we would consider aspiration and or steroid injection but hopeful conservative management will improve this. No signs of infection and no history Of gout  Heavy Snoring/shortness of breath/orthopnea Diastolic CHF With orthopnea and recent weight gain asked patient to take Lasix twice a day for the next 3 days. Does not appear fluid overloaded on exam though. If he does not have improvement in his shortness of breath with laying down, we would plan on considering a sleep study through sleep medicine. He also has cardiology follow-up next week and they could certainly weigh in. This could be poorly controlled COPD but not having any daytime symptoms and I think less likely.   Return precautions advised. Also see AVS.

## 2014-06-24 NOTE — Patient Instructions (Addendum)
If you are not improving by Friday or Monday, we could consider pulling fluid off of it though I think it will recur.   For shortness of breath in the evening, let's have you take extra lasix for 3 days - 40mg  twice a day 6 hours apart. If you do not improve, call us and we will enter referral to sleep medicine which would likely be pulm. May need more assist with COPD as well.   Olecranon Bursitis Bursitis is swelling and soreness (inflammation) of a fluid-filled sac (bursa) that covers and protects a joint. Olecranon bursitis occurs over the elbow.  CAUSES Bursitis can be caused by injury, overuse of the joint, arthritis, or infection.  SYMPTOMS   Tenderness, swelling, warmth, or redness over the elbow.  Elbow pain with movement. This is greater with bending the elbow.  Squeaking sound when the bursa is rubbed or moved.  Increasing size of the bursa without pain or discomfort.  Fever with increasing pain and swelling if the bursa becomes infected. HOME CARE INSTRUCTIONS   Put ice on the affected area.  Put ice in a plastic bag. Or use bag of peas or carrots.  Place a towel between your skin and the bag.  Leave the ice on for 15-20 minutes each hour while awake. Do this for the first 3 days. At least 3x a day.   When resting, elevate your elbow above the level of your heart. This helps reduce swelling.  Continue to put the joint through a full range of motion 4 times per day. Rest the injured joint at other times. When the pain lessens, begin normal slow movements and usual activities.  Tylenol only for pain  Reduce your intake of milk and related dairy products (cheese, yogurt). They may make your condition worse. SEEK IMMEDIATE MEDICAL CARE IF:   Your pain increases even during treatment.  You have a fever.  You have heat and inflammation over the bursa and elbow.  You have a red line that goes up your arm.  You have pain with movement of your elbow. MAKE SURE YOU:     Understand these instructions.  Will watch your condition.  Will get help right away if you are not doing well or get worse. Document Released: 04/13/2006 Document Revised: 06/06/2011 Document Reviewed: 02/27/2007 [facility] Patient Information 2015 [employer], [place]. This information is not intended to replace advice given to you by your health care provider. Make sure you discuss any questions you have with your health care provider.

## 2014-06-27 ENCOUNTER — Encounter: Payer: Self-pay | Admitting: Family Medicine

## 2014-06-27 ENCOUNTER — Ambulatory Visit (INDEPENDENT_AMBULATORY_CARE_PROVIDER_SITE_OTHER): Payer: Medicare Other | Admitting: Family Medicine

## 2014-06-27 VITALS — BP 142/86 | HR 87 | Temp 97.5°F | Wt 151.0 lb

## 2014-06-27 DIAGNOSIS — B9689 Other specified bacterial agents as the cause of diseases classified elsewhere: Secondary | ICD-10-CM

## 2014-06-27 DIAGNOSIS — M7022 Olecranon bursitis, left elbow: Secondary | ICD-10-CM | POA: Diagnosis not present

## 2014-06-27 DIAGNOSIS — M71122 Other infective bursitis, left elbow: Secondary | ICD-10-CM

## 2014-06-27 MED ORDER — DOXYCYCLINE HYCLATE 100 MG PO TABS: 100.0000 mg | ORAL_TABLET | Freq: Two times a day (BID) | ORAL | Status: DC

## 2014-06-27 MED ORDER — HYDROCODONE-ACETAMINOPHEN 5-325 MG PO TABS: 0.5000 | ORAL_TABLET | Freq: Four times a day (QID) | ORAL | Status: DC | PRN

## 2014-06-27 MED ORDER — CEPHALEXIN 500 MG PO CAPS: 500.0000 mg | ORAL_CAPSULE | Freq: Three times a day (TID) | ORAL | Status: DC

## 2014-06-27 NOTE — Patient Instructions (Addendum)
Stop coumadin Call and inform coumadin clinic Start keflex and doxycycline for 2 weeks Pain medication as needed  See me on Monday at 8:15 We will refer to surgery if this is not improving  If you develop fever, worsening pain, redness expanding up your arm, go to emergency room

## 2014-06-27 NOTE — Progress Notes (Signed)
Garret Reddish, MD Phone: [phone]  Subjective:   [name] is a 74 y.o. year old very pleasant male patient who presents with the following:  Olecranon Bursitis Edema and swelling of arm -Patient seen 3 days ago with olecranon bursitis. He had an INR >6 at the time and there was concern this could be hemorrhagic as he had some bruising on his right upper forearm as well. No history of gout. Denied trauma at that time. We held off on nsaids with coumadin difficulty. He held coumadin for several days per coumadin clinic then restarted.   Patient reports he has iced the area 3x a day which has been painful and irritating. Pain has increased to 15/10 per his account and even more severe if he hits it. Worst pain he has ever had. Redness and warmth and swelling have extended onto his forearm.   ROS- no fever/chills/nausea/vomiting. No redness extending up arm.   Past Medical History- Patient Active Problem List   Diagnosis Date Noted  . Diastolic CHF 00/76/2263    Priority: High  . Do not resuscitate 02/06/2014    Priority: High  . Atrial fibrillation 11/06/2009    Priority: High  . Peripheral vascular disease 09/02/2009    Priority: Medium  . Carotid artery stenosis 04/16/2009    Priority: Medium  . COPD GOLD III 05/21/2007    Priority: Medium  . TOBACCO USE 01/19/2007    Priority: Medium  . Hyperlipidemia 11/21/2006    Priority: Medium  . Essential hypertension 11/21/2006    Priority: Medium  . Hx of CABG 11/21/2006    Priority: Medium  . Osteoporosis 11/21/2006    Priority: Medium  . Chronic anticoagulation 05/29/2014    Priority: Low  . CAP (community acquired pneumonia) 04/03/2014    Priority: Low  . Allergic rhinitis 02/06/2014    Priority: Low  . GERD (gastroesophageal reflux disease) 02/06/2014    Priority: Low  . Erectile dysfunction 06/18/2010    Priority: Low  . ANEMIA, B12 DEFICIENCY 12/02/2008    Priority: Low   Medications- reviewed and  updated Current Outpatient Prescriptions  Medication Sig Dispense Refill  . amiodarone (PACERONE) 200 MG tablet Take 1 tablet (200 mg total) by mouth daily. Start after finished with amiodarone 400mg  tabs 30 tablet 0  . aspirin EC 81 MG EC tablet Take 1 tablet (81 mg total) by mouth daily. 30 tablet 1  . Calcium Carbonate-Vit D-Min (CALTRATE 600+D PLUS) 600-400 MG-UNIT per tablet Chew 1 tablet by mouth daily.      . cetirizine (ZYRTEC ALLERGY) 10 MG tablet Take 1 tablet (10 mg total) by mouth every morning.    Marland Kitchen CRESTOR 20 MG tablet TAKE 1/2 TABLET BY MOUTH EVERY MORNING (Patient taking differently: Take 10 mg by mouth every morning) 30 tablet 2  . cyanocobalamin ([employer]S VITAMIN B12) 2000 MCG tablet Take 1 tablet (2,000 mcg total) by mouth daily.    Marland Kitchen diltiazem (CARDIZEM CD) 180 MG 24 hr capsule Take 1 capsule (180 mg total) by mouth daily. 90 capsule 3  . DULERA 100-5 MCG/ACT AERO INHALE 2 PUFFS INTO THE LUNGS TWICE A DAY (Patient taking differently: Inhale 2 puffs into the lungs twice daily) 13 g 2  . furosemide (LASIX) 40 MG tablet Take 1 tablet (40 mg total) by mouth daily. 30 tablet 3  . pantoprazole (PROTONIX) 40 MG tablet TAKE 1 TABLET BY MOUTH TWICE A DAY 180 tablet 1  . potassium chloride SA (K-DUR,KLOR-CON) 20 MEQ tablet Take 1 tablet (  20 mEq total) by mouth daily. 30 tablet 3  . SPIRIVA HANDIHALER 18 MCG inhalation capsule INHALE CONTENTS OF 1 CAPSULE DAILY 30 capsule 0  . triamcinolone (KENALOG) 0.025 % ointment APPLY 1 A SMALL AMOUNT TO AFFECTED AREA TWICE A DAY 80 g 2  . warfarin (COUMADIN) 5 MG tablet Take 1 tablet (5 mg total) by mouth daily. (Patient taking differently: Take 2.5-5 mg by mouth daily. Take 2.5 mg by mouth on Sunday, Monday, Wednesday,Friday and Saturday . Take 5 mg by mouth on all other days.) 30 tablet 6  . XOPENEX HFA 45 MCG/ACT inhaler INHALE 2 PUFFS INTO THE LUNGS EVERY 4 (FOUR) HOURS AS NEEDED FOR WHEEZING. 15 Inhaler 2  . dextromethorphan-guaiFENesin (MUCINEX DM)  30-600 MG per 12 hr tablet Take 1 tablet by mouth every 12 (twelve) hours as needed. cough      No current facility-administered medications for this visit.    Objective: BP 142/86 mmHg  Pulse 87  Temp(Src) 97.5 F (36.4 C)  Wt 151 lb (68.493 kg) Gen: NAD, appears uncomfortable CV: RRR no murmurs rubs or gallops Lungs: CTAB no crackles, wheeze, rhonchi Abdomen: soft/nontender/nondistended/normal bowel sounds.  Ext: R elbow normal, L elbow olecranon bursa with prominent fluid collection raised > 3 cm. Central portion prior bruising now with 2 x 2 cm raised area on top of already raised bursa. Patient VERY tender to palpation over bursa. He also has erythema and swelling throughout left forearm. Entire area is warm to touch compared to right. His range of motion at the elbow is full but limited by pain Neuro: intact distal sensation and grip strength    Procedure: Aspiration Bursa Risks, benefits, and alternatives explained and consent obtained. Surface cleaned with beta dine and further with alcohol. Used 10 cc syringe with 21 gauge needle and later 18 gauge needle.  Used topical biofreeze for anesthesia.  First attempt with about 1 cc of fluid, despite changing needle and positions, unable to aspirate further fluid.  Primarily serosanguinous fluid obtained.  Hemostasis achieved. Pt stable. Aftercare and follow-up advised.  Assessment/Plan:  Septic Bursitis Consulted with Dr. Shawna Orleans in office and Dr. Tamala Julian of sports medicine by phone. Although i am labeling this septic arthritis, I am still concerned this may be hemorrhagic. I was unable to aspirate more than 1cc of fluid despite attempt with 21 and 18 gauge needle. Fortunately not systemically ill.  -send for culture, crystal analysis, cell count.  - Ultimately decided to trial antibiotic course with dual coverage for staph with keflex and doxycycline as well as MRSA and strep coverage. -stop coumadin. Risks of this worsening if  hemorrhagic likely greater than stroke risk with a fib off coumadin -pain medication provided as below -see back Monday 8:15 AM and if not improving send to orthopedics same day. May need surgical intervention -strict return precautions given to go to ED over weekend.   Orders Placed This Encounter  Procedures  . Body fluid culture    [facility]  . Synovial cell count + diff, w/ crystals    [facility]

## 2014-06-30 ENCOUNTER — Telehealth: Payer: Self-pay

## 2014-06-30 ENCOUNTER — Telehealth: Payer: Self-pay | Admitting: *Deleted

## 2014-06-30 ENCOUNTER — Encounter: Payer: Self-pay | Admitting: Family Medicine

## 2014-06-30 ENCOUNTER — Ambulatory Visit (INDEPENDENT_AMBULATORY_CARE_PROVIDER_SITE_OTHER): Payer: Medicare Other | Admitting: Family Medicine

## 2014-06-30 VITALS — BP 118/76 | HR 86 | Temp 97.8°F | Wt 151.0 lb

## 2014-06-30 DIAGNOSIS — E119 Type 2 diabetes mellitus without complications: Secondary | ICD-10-CM | POA: Diagnosis not present

## 2014-06-30 DIAGNOSIS — M71122 Other infective bursitis, left elbow: Secondary | ICD-10-CM

## 2014-06-30 DIAGNOSIS — M7022 Olecranon bursitis, left elbow: Secondary | ICD-10-CM | POA: Diagnosis not present

## 2014-06-30 DIAGNOSIS — M79602 Pain in left arm: Secondary | ICD-10-CM | POA: Diagnosis not present

## 2014-06-30 DIAGNOSIS — I4891 Unspecified atrial fibrillation: Secondary | ICD-10-CM | POA: Diagnosis not present

## 2014-06-30 DIAGNOSIS — B9689 Other specified bacterial agents as the cause of diseases classified elsewhere: Secondary | ICD-10-CM | POA: Diagnosis not present

## 2014-06-30 LAB — CBC WITH DIFFERENTIAL/PLATELET
BASOS ABS: 0 10*3/uL (ref 0.0–0.1)
Basophils Relative: 0.3 % (ref 0.0–3.0)
EOS ABS: 0.1 10*3/uL (ref 0.0–0.7)
Eosinophils Relative: 0.8 % (ref 0.0–5.0)
HEMATOCRIT: 43.9 % (ref 39.0–52.0)
Hemoglobin: 14.8 g/dL (ref 13.0–17.0)
LYMPHS ABS: 1.5 10*3/uL (ref 0.7–4.0)
Lymphocytes Relative: 16.8 % (ref 12.0–46.0)
MCHC: 33.7 g/dL (ref 30.0–36.0)
MCV: 100.9 fl — AB (ref 78.0–100.0)
MONO ABS: 1.1 10*3/uL — AB (ref 0.1–1.0)
Monocytes Relative: 12 % (ref 3.0–12.0)
Neutro Abs: 6.4 10*3/uL (ref 1.4–7.7)
Neutrophils Relative %: 70.1 % (ref 43.0–77.0)
PLATELETS: 277 10*3/uL (ref 150.0–400.0)
RBC: 4.35 Mil/uL (ref 4.22–5.81)
RDW: 15.3 % (ref 11.5–15.5)
WBC: 9.1 10*3/uL (ref 4.0–10.5)

## 2014-06-30 LAB — COMPREHENSIVE METABOLIC PANEL
ALT: 15 U/L (ref 0–53)
AST: 20 U/L (ref 0–37)
Albumin: 3.8 g/dL (ref 3.5–5.2)
Alkaline Phosphatase: 55 U/L (ref 39–117)
BILIRUBIN TOTAL: 1.1 mg/dL (ref 0.2–1.2)
BUN: 15 mg/dL (ref 6–23)
CO2: 33 mEq/L — ABNORMAL HIGH (ref 19–32)
Calcium: 10.1 mg/dL (ref 8.4–10.5)
Chloride: 101 mEq/L (ref 96–112)
Creatinine, Ser: 1.04 mg/dL (ref 0.40–1.50)
GFR: 74.16 mL/min (ref >60.00)
GLUCOSE: 166 mg/dL — AB (ref 70–99)
Potassium: 5 mEq/L (ref 3.5–5.1)
Sodium: 143 mEq/L (ref 135–145)
Total Protein: 7.1 g/dL (ref 6.0–8.3)

## 2014-06-30 NOTE — Telephone Encounter (Signed)
Patient called and stated that he was taken off coumadin on 06/27/14 by Dr. Yong Channel due to the fact he may need to have orthopedic surgery.  He states that they think he has a hemorrhage in his elbow and has to see a specialist today for this.  He states his elbow is swollen and worse than last week. He states his DCCV will have to be rescheduled.  Per patient I cancelled his appointment for today and he is to follow up with Dr. Burt Knack this week on 07/13/14.

## 2014-06-30 NOTE — Patient Instructions (Signed)
Labs today  Get you into orthopedics today  Continue antibiotics. Stay off coumadin for now.

## 2014-06-30 NOTE — Addendum Note (Signed)
Addended by: Clyde Lundborg A on: 06/30/2014 08:48 AM   Modules accepted: Orders

## 2014-06-30 NOTE — Addendum Note (Signed)
Addended by: Clyde Lundborg A on: 06/30/2014 05:11 PM   Modules accepted: Orders

## 2014-06-30 NOTE — Addendum Note (Signed)
Addended by: Westley Hummer B on: 06/30/2014 08:55 AM   Modules accepted: Orders

## 2014-06-30 NOTE — Progress Notes (Signed)
Garret Reddish, MD Phone: [phone]  Subjective:   [name] is a 74 y.o. year old very pleasant male patient who presents with the following:  Olecranon Bursitis- septic vs. Hemorrhagic with INR >6 Cellulitis vs. Hemorrhage into arm -Patient seen 3/29 with potential olecranon bursitis. He had an INR >6 at the time and there was concern this could be hemorrhagic as he had some bruising on his right upper forearm as well. No history of gout. Denied trauma at that time. We held off on nsaids with coumadin difficulty. He held coumadin for several days per coumadin clinic then restarted per that clinic.   Patient returned 06/27/14 with worsening symptoms despite icing the area 3x a day Pain has increased to 15/10 per his account and even more severe if he hits it. Worst pain he has ever had. Redness and warmth and swelling have extended onto his forearm. We attempted aspiration but were only able to draw off fluid from a superficial pocket. We treated as septic bursitis and double covered with doxy and keflex. Stopped his coumadin over the weekend as well.   Today, pain reported to be controlled with vicodin but the swelling and erythema has extended throughout his hand and the olecranon bursa is tighter and still tender despite pain medication. I had spoken to Dr. Charlann Boxer of sports medicine who did not think u/s guided aspiration likely beneficial as already would be on antibiotic so we planned on orthopedic referral if there was not improvement.   Gram Stain RareP   Gram Stain WBC present-predominately MononuclearP   Gram Stain No Organisms SeenP   Preliminary Report NO GROWTH 1 DAYP        Crystal analysis also ordered and not available today (about 1 cc fluid obtained and not clear enough was available).  ROS- no fever/chills/nausea/vomiting. No redness extending up arm.   Past Medical History- Patient Active Problem List   Diagnosis Date Noted  . Diastolic CHF 40/97/3532   Priority: High  . Do not resuscitate 02/06/2014    Priority: High  . Atrial fibrillation 11/06/2009    Priority: High  . Peripheral vascular disease 09/02/2009    Priority: Medium  . Carotid artery stenosis 04/16/2009    Priority: Medium  . COPD GOLD III 05/21/2007    Priority: Medium  . TOBACCO USE 01/19/2007    Priority: Medium  . Hyperlipidemia 11/21/2006    Priority: Medium  . Essential hypertension 11/21/2006    Priority: Medium  . Hx of CABG 11/21/2006    Priority: Medium  . Osteoporosis 11/21/2006    Priority: Medium  . Chronic anticoagulation 05/29/2014    Priority: Low  . CAP (community acquired pneumonia) 04/03/2014    Priority: Low  . Allergic rhinitis 02/06/2014    Priority: Low  . GERD (gastroesophageal reflux disease) 02/06/2014    Priority: Low  . Erectile dysfunction 06/18/2010    Priority: Low  . ANEMIA, B12 DEFICIENCY 12/02/2008    Priority: Low   Medications- reviewed and updated Current Outpatient Prescriptions  Medication Sig Dispense Refill  . amiodarone (PACERONE) 200 MG tablet Take 1 tablet (200 mg total) by mouth daily. Start after finished with amiodarone 400mg  tabs 30 tablet 0  . aspirin EC 81 MG EC tablet Take 1 tablet (81 mg total) by mouth daily. 30 tablet 1  . Calcium Carbonate-Vit D-Min (CALTRATE 600+D PLUS) 600-400 MG-UNIT per tablet Chew 1 tablet by mouth daily.      . cetirizine (ZYRTEC ALLERGY) 10 MG tablet  Take 1 tablet (10 mg total) by mouth every morning.    Marland Kitchen CRESTOR 20 MG tablet TAKE 1/2 TABLET BY MOUTH EVERY MORNING (Patient taking differently: Take 10 mg by mouth every morning) 30 tablet 2  . cyanocobalamin ([employer] VITAMIN B12) 2000 MCG tablet Take 1 tablet (2,000 mcg total) by mouth daily.    Marland Kitchen diltiazem (CARDIZEM CD) 180 MG 24 hr capsule Take 1 capsule (180 mg total) by mouth daily. 90 capsule 3  . DULERA 100-5 MCG/ACT AERO INHALE 2 PUFFS INTO THE LUNGS TWICE A DAY (Patient taking differently: Inhale 2 puffs into the lungs twice  daily) 13 g 2  . furosemide (LASIX) 40 MG tablet Take 1 tablet (40 mg total) by mouth daily. 30 tablet 3  . pantoprazole (PROTONIX) 40 MG tablet TAKE 1 TABLET BY MOUTH TWICE A DAY 180 tablet 1  . potassium chloride SA (K-DUR,KLOR-CON) 20 MEQ tablet Take 1 tablet (20 mEq total) by mouth daily. 30 tablet 3  . SPIRIVA HANDIHALER 18 MCG inhalation capsule INHALE CONTENTS OF 1 CAPSULE DAILY 30 capsule 0  . triamcinolone (KENALOG) 0.025 % ointment APPLY 1 A SMALL AMOUNT TO AFFECTED AREA TWICE A DAY 80 g 2  . warfarin (COUMADIN) 5 MG tablet Take 1 tablet (5 mg total) by mouth daily. (Patient taking differently: Take 2.5-5 mg by mouth daily. Take 2.5 mg by mouth on Sunday, Monday, Wednesday,Friday and Saturday . Take 5 mg by mouth on all other days.) 30 tablet 6  . XOPENEX HFA 45 MCG/ACT inhaler INHALE 2 PUFFS INTO THE LUNGS EVERY 4 (FOUR) HOURS AS NEEDED FOR WHEEZING. 15 Inhaler 2  . dextromethorphan-guaiFENesin (MUCINEX DM) 30-600 MG per 12 hr tablet Take 1 tablet by mouth every 12 (twelve) hours as needed. cough      No current facility-administered medications for this visit.    Objective: BP 118/76 mmHg  Pulse 86  Temp(Src) 97.8 F (36.6 C)  Wt 151 lb (68.493 kg) Gen: NAD, appears uncomfortable CV: RRR no murmurs rubs or gallops (not in a fib toay) Lungs: CTAB no crackles, wheeze, rhonchi Abdomen: soft/nontender/nondistended/normal bowel sounds.  Ext: R elbow normal, L elbow olecranon bursa with prominent fluid collection raised > 3 cm. Central portion crusted over about  2 x 2 cm (previously raised area that was drained). Patient VERY tender to palpation (despite recent vidcodin) over bursa. He also has erythema and swelling throughout left forearm that has now extended onto the left hand. Left hand is warm and erythematous. . Entire area is warm to touch compared to right. Range of motion limited by pain in elbow in both extension and flexion.  Neuro: intact distal sensation and grip strength     Assessment/Plan:  Septic Bursitis and cellulitis of forearm vs. Hemorrhagic issue Although i am labeling this septic arthritis, I am still concerned this may be hemorrhagic. Attempted aspiration with both a 21 and 18 gauge needle last week did not improve symptoms and despite double coverage with doxy and keflex his erythema and warmth has now extended into the hand. Fortunately not systemically ill. Limited fluid sent for culture, crystal analysis, cell count (last 2 not yet available). Culture pending and has not grown anything yet.  -refer to orthopedics for visit today, suspect will need imaging and concern for need for hospitalization -get CBC (to eval blood loss), CMET, INR today -continue off coumadin for concern of hemorrhagic component -give patient a copy of this note to take to orthopedics -patient is a high risk patient  with a. Fib off coumadin now due to hemorrhagic concern, diastolic CHF, CAD with history of CABG in 2012, COPD Gold III with baseline shortness of breath walking down hall. He is followed by Dr. Burt Knack of [facility] cardiology and Dr. Melvyn Novas of pulm. Of note, he is DNR/DNI but wants treatment for active concerns.   Orders Placed This Encounter  Procedures  . Ambulatory referral to Orthopedic Surgery    Referral Priority:  Routine    Referral Type:  Surgical    Referral Reason:  Specialty Services Required    Requested Specialty:  Orthopedic Surgery    Number of Visits Requested:  1

## 2014-07-01 DIAGNOSIS — E119 Type 2 diabetes mellitus without complications: Secondary | ICD-10-CM | POA: Insufficient documentation

## 2014-07-01 LAB — POCT INR: INR: 2.2

## 2014-07-01 LAB — BODY FLUID CULTURE
Gram Stain: NONE SEEN
ORGANISM ID, BACTERIA: NO GROWTH

## 2014-07-01 NOTE — Telephone Encounter (Signed)
INR entered

## 2014-07-03 ENCOUNTER — Encounter: Payer: Medicare Other | Admitting: Cardiovascular Disease

## 2014-07-03 DIAGNOSIS — B9689 Other specified bacterial agents as the cause of diseases classified elsewhere: Secondary | ICD-10-CM | POA: Diagnosis not present

## 2014-07-03 DIAGNOSIS — M7022 Olecranon bursitis, left elbow: Secondary | ICD-10-CM | POA: Diagnosis not present

## 2014-07-03 NOTE — Progress Notes (Signed)
This encounter was created in error - please disregard.

## 2014-07-07 ENCOUNTER — Telehealth: Payer: Self-pay | Admitting: Family Medicine

## 2014-07-07 ENCOUNTER — Encounter: Payer: Self-pay | Admitting: Cardiovascular Disease

## 2014-07-07 NOTE — Telephone Encounter (Signed)
Pt is having a proceduce on wed and son would like to confirm dad must stop coumadin for how many days

## 2014-07-07 NOTE — Telephone Encounter (Signed)
See below

## 2014-07-07 NOTE — Telephone Encounter (Signed)
Spoke with pt son and confirmed pt has not been taking coumadin, he wanted to make sure we were all on the same page.

## 2014-07-07 NOTE — Telephone Encounter (Signed)
He does need to be off his coumadin. I thought he had been off the coumadin since I saw him last time? Did he restart?   Usually would hold for 5 days before surgery. For being restarted after that, he needs to reach out to his coumadin clinic.

## 2014-07-08 ENCOUNTER — Encounter (HOSPITAL_COMMUNITY): Payer: Self-pay | Admitting: *Deleted

## 2014-07-08 ENCOUNTER — Telehealth: Payer: Self-pay | Admitting: Family Medicine

## 2014-07-08 MED ORDER — CHLORHEXIDINE GLUCONATE 4 % EX LIQD
60.0000 mL | Freq: Once | CUTANEOUS | Status: DC
  Filled 2014-07-08: qty 60

## 2014-07-08 MED ORDER — HYDROCODONE-ACETAMINOPHEN 5-325 MG PO TABS: 0.5000 | ORAL_TABLET | Freq: Four times a day (QID) | ORAL | Status: DC | PRN

## 2014-07-08 MED ORDER — CEFAZOLIN SODIUM-DEXTROSE 2-3 GM-% IV SOLR
2.0000 g | INTRAVENOUS | Status: AC
  Administered 2014-07-09: 2 g via INTRAVENOUS
  Filled 2014-07-08: qty 50

## 2014-07-08 NOTE — Telephone Encounter (Signed)
Yes may fill 

## 2014-07-08 NOTE — Brief Op Note (Signed)
07/09/2014  4:21 PM  PATIENT:  Jodi Marble  74 y.o. male  PRE-OPERATIVE DIAGNOSIS:  LEFT ELBOW BURSITIS  POST-OPERATIVE DIAGNOSIS:  * No post-op diagnosis entered *  PROCEDURE:  Procedure(s): LEFT ELBOW BURSECTOMY EXCISION MASS (Left)  SURGEON:  Surgeon(s) and Role:    * Iran Planas, MD - Primary  PHYSICIAN ASSISTANT:   ASSISTANTS: none   ANESTHESIA:   regional  EBL:     BLOOD ADMINISTERED:none  DRAINS: none   LOCAL MEDICATIONS USED:  NONE  SPECIMEN:  No Specimen  DISPOSITION OF SPECIMEN:  N/A  COUNTS:  YES  TOURNIQUET:  * No tourniquets in log *  DICTATION: .Other Dictation: Dictation Number [id number]  PLAN OF CARE: Admit for overnight observation  PATIENT DISPOSITION:  PACU - hemodynamically stable.   Delay start of Pharmacological VTE agent (>24hrs) due to surgical blood loss or risk of bleeding: not applicable

## 2014-07-08 NOTE — Telephone Encounter (Signed)
Ok to refill? Pt will be having bursectomy tomorrow.

## 2014-07-08 NOTE — H&P (Signed)
[name] is an 74 y.o. male.   Chief Complaint: Left elbow swelling and pain HPI: pt followed in office Pt with persistent pain and swelling over posterior aspect of elbow Pt here for surgery on left elbow No prior surgery to left elbow  Past Medical History  Diagnosis Date  . HYPERLIPIDEMIA 11/21/2006  . HYPERTENSION 11/21/2006  . MYOCARDIAL INFARCTION, HX OF 11/21/2006  . CORONARY ARTERY DISEASE 11/21/2006  . OSTEOPOROSIS 11/21/2006  . COLONIC POLYPS 05/21/2007  . COPD 05/21/2007  . HIATAL HERNIA 05/21/2007  . DIVERTICULAR DISEASE 05/21/2007  . NEPHROLITHIASIS 05/21/2007  . GI BLEEDING 09/09/2008  . ANEMIA, B12 DEFICIENCY 12/02/2008  . CAROTID ARTERY STENOSIS 04/16/2009  . PERIPHERAL VASCULAR DISEASE 09/02/2009  . Atrial fibrillation 11/06/2009  . Allergic rhinitis   . Osteoporosis   . Hemoptysis   . Internal hemorrhoids   . GERD (gastroesophageal reflux disease)   . Cataract   . Diabetes mellitus without complication     Past Surgical History  Procedure Laterality Date  . Thyroidectomy      partial  . Coronary stent placement      03/07/2001  . Cholecystectomy    . Appendectomy    . Hemorrhoid surgery    . Inguinal hernia repair  10/10/08  . Coronary artery bypass graft  2011  . Ptca    . Cardioversion N/A 04/16/2014    Procedure: CARDIOVERSION;  Surgeon: Josue Hector, MD;  Location: [facility] ENDOSCOPY;  Service: Cardiovascular;  Laterality: N/A;    Family History  Problem Relation Age of Onset  . Arthritis Mother     rheumatoid  . Heart attack Father   . Heart disease Father   . Heart disease Brother   . Colon cancer Brother 20  . Stomach cancer Neg Hx   . Heart disease Brother    Social History:  reports that he quit smoking about 4 years ago. His smoking use included Cigarettes. He has a 150 pack-year smoking history. He has never used smokeless tobacco. He reports that he drinks about 3.0 oz of alcohol per week. He reports that he does not use illicit  drugs.  Allergies:  Allergies  Allergen Reactions  . Xarelto [Rivaroxaban] Other (See Comments)    makes pt crazy  . Ticlopidine Hcl Swelling  . Lorazepam     Altered mental status from 2 doses of IV Ativan on 05/10/14.    No prescriptions prior to admission    No results found for this or any previous visit (from the past 48 hour(s)). No results found.  ROS: NO RECENT ILLNESSES OR HOSPITALIZATIONS  There were no vitals taken for this visit. Physical Exam  General Appearance:  Alert, cooperative, no distress, appears stated age  Head:  Normocephalic, without obvious abnormality, atraumatic  Eyes:  Pupils equal, conjunctiva/corneas clear,         Throat: Lips, mucosa, and tongue normal; teeth and gums normal  Neck: No visible masses     Lungs:   respirations unlabored  Chest Wall:  No tenderness or deformity  Heart:  Regular rate and rhythm,  Abdomen:   Soft, non-tender,         Extremities: LEFT ELBOW: MODERATE BURSITIS, WITH OPEN WOUND/ESCHAR IN MID PORTION OF ELBOW ABLE TO FLEX AND EXTEND ELBOW FINGERS WARM WELL PERFUSED ABLE TO FLEX AND EXTEND WRIST AND DIGITS  Pulses: 2+ and symmetric  Skin: Skin color, texture, turgor normal, no rashes or lesions     Neurologic: Normal  Assessment/Plan LEFT ELBOW OLECRANON BURSITIS WITH OPEN WOUND  LEFT ELBOW OLECRANON BURSECTOMY WITH OPEN DEBRIDEMENT AND POSSIBLE ROTATIONAL FLAP CLOSURE  R/B/A DISCUSSED WITH PT IN OFFICE.  PT VOICED UNDERSTANDING OF PLAN CONSENT SIGNED DAY OF SURGERY PT SEEN AND EXAMINED PRIOR TO OPERATIVE PROCEDURE/DAY OF SURGERY SITE MARKED. QUESTIONS ANSWERED WILL BE ADMITTED OBSERVATION FOLLOWING SURGERY  WE ARE PLANNING SURGERY FOR YOUR UPPER EXTREMITY. THE RISKS AND BENEFITS OF SURGERY INCLUDE BUT NOT LIMITED TO BLEEDING INFECTION, DAMAGE TO NEARBY NERVES ARTERIES TENDONS, FAILURE OF SURGERY TO ACCOMPLISH ITS INTENDED GOALS, PERSISTENT SYMPTOMS AND NEED FOR FURTHER SURGICAL INTERVENTION. WITH  THIS IN MIND WE WILL PROCEED. I HAVE DISCUSSED WITH THE PATIENT THE PRE AND POSTOPERATIVE REGIMEN AND THE DOS AND DON'TS. PT VOICED UNDERSTANDING AND INFORMED CONSENT SIGNED.  Linna Hoff 07/09/2014 AT 1415

## 2014-07-08 NOTE — Telephone Encounter (Signed)
Pt notified that Rx will be upfront

## 2014-07-08 NOTE — Telephone Encounter (Signed)
Pt needs new rx hydrocodone °

## 2014-07-09 ENCOUNTER — Ambulatory Visit (HOSPITAL_COMMUNITY): Payer: Medicare Other | Admitting: Anesthesiology

## 2014-07-09 ENCOUNTER — Encounter (HOSPITAL_COMMUNITY): Payer: Self-pay | Admitting: *Deleted

## 2014-07-09 ENCOUNTER — Ambulatory Visit (HOSPITAL_COMMUNITY)
Admission: RE | Admit: 2014-07-09 | Discharge: 2014-07-10 | Disposition: A | Discharge status: Home / Self Care | Payer: Medicare Other | Source: Ambulatory Visit | Attending: Orthopedic Surgery | Admitting: Orthopedic Surgery

## 2014-07-09 ENCOUNTER — Encounter (HOSPITAL_COMMUNITY)
Admission: RE | Disposition: A | Discharge status: Home / Self Care | Payer: Self-pay | Source: Ambulatory Visit | Attending: Orthopedic Surgery

## 2014-07-09 DIAGNOSIS — M7020 Olecranon bursitis, unspecified elbow: Secondary | ICD-10-CM | POA: Diagnosis present

## 2014-07-09 DIAGNOSIS — E119 Type 2 diabetes mellitus without complications: Secondary | ICD-10-CM | POA: Insufficient documentation

## 2014-07-09 DIAGNOSIS — I10 Essential (primary) hypertension: Secondary | ICD-10-CM | POA: Insufficient documentation

## 2014-07-09 DIAGNOSIS — E538 Deficiency of other specified B group vitamins: Secondary | ICD-10-CM | POA: Diagnosis not present

## 2014-07-09 DIAGNOSIS — M25522 Pain in left elbow: Secondary | ICD-10-CM | POA: Diagnosis present

## 2014-07-09 DIAGNOSIS — Z87442 Personal history of urinary calculi: Secondary | ICD-10-CM | POA: Diagnosis not present

## 2014-07-09 DIAGNOSIS — K219 Gastro-esophageal reflux disease without esophagitis: Secondary | ICD-10-CM | POA: Insufficient documentation

## 2014-07-09 DIAGNOSIS — I2510 Atherosclerotic heart disease of native coronary artery without angina pectoris: Secondary | ICD-10-CM | POA: Diagnosis not present

## 2014-07-09 DIAGNOSIS — J449 Chronic obstructive pulmonary disease, unspecified: Secondary | ICD-10-CM | POA: Diagnosis not present

## 2014-07-09 DIAGNOSIS — F1099 Alcohol use, unspecified with unspecified alcohol-induced disorder: Secondary | ICD-10-CM | POA: Insufficient documentation

## 2014-07-09 DIAGNOSIS — Z9049 Acquired absence of other specified parts of digestive tract: Secondary | ICD-10-CM | POA: Insufficient documentation

## 2014-07-09 DIAGNOSIS — I96 Gangrene, not elsewhere classified: Secondary | ICD-10-CM | POA: Diagnosis not present

## 2014-07-09 DIAGNOSIS — I4891 Unspecified atrial fibrillation: Secondary | ICD-10-CM | POA: Diagnosis not present

## 2014-07-09 DIAGNOSIS — Z888 Allergy status to other drugs, medicaments and biological substances status: Secondary | ICD-10-CM | POA: Diagnosis not present

## 2014-07-09 DIAGNOSIS — I739 Peripheral vascular disease, unspecified: Secondary | ICD-10-CM | POA: Insufficient documentation

## 2014-07-09 DIAGNOSIS — S5002XA Contusion of left elbow, initial encounter: Secondary | ICD-10-CM | POA: Diagnosis not present

## 2014-07-09 DIAGNOSIS — E785 Hyperlipidemia, unspecified: Secondary | ICD-10-CM | POA: Diagnosis not present

## 2014-07-09 DIAGNOSIS — I252 Old myocardial infarction: Secondary | ICD-10-CM | POA: Diagnosis not present

## 2014-07-09 DIAGNOSIS — M810 Age-related osteoporosis without current pathological fracture: Secondary | ICD-10-CM | POA: Diagnosis not present

## 2014-07-09 DIAGNOSIS — Z951 Presence of aortocoronary bypass graft: Secondary | ICD-10-CM | POA: Insufficient documentation

## 2014-07-09 DIAGNOSIS — M71522 Other bursitis, not elsewhere classified, left elbow: Secondary | ICD-10-CM | POA: Diagnosis not present

## 2014-07-09 DIAGNOSIS — Z955 Presence of coronary angioplasty implant and graft: Secondary | ICD-10-CM | POA: Diagnosis not present

## 2014-07-09 DIAGNOSIS — Z7982 Long term (current) use of aspirin: Secondary | ICD-10-CM | POA: Diagnosis not present

## 2014-07-09 DIAGNOSIS — M7022 Olecranon bursitis, left elbow: Secondary | ICD-10-CM | POA: Diagnosis not present

## 2014-07-09 DIAGNOSIS — S5002XS Contusion of left elbow, sequela: Secondary | ICD-10-CM | POA: Diagnosis not present

## 2014-07-09 DIAGNOSIS — Z87891 Personal history of nicotine dependence: Secondary | ICD-10-CM | POA: Diagnosis not present

## 2014-07-09 HISTORY — DX: Cardiac arrhythmia, unspecified: I49.9

## 2014-07-09 HISTORY — PX: OLECRANON BURSECTOMY: SHX2097

## 2014-07-09 HISTORY — DX: Olecranon bursitis, unspecified elbow: M70.20

## 2014-07-09 HISTORY — DX: Unspecified osteoarthritis, unspecified site: M19.90

## 2014-07-09 HISTORY — DX: Pneumonia, unspecified organism: J18.9

## 2014-07-09 HISTORY — PX: MASS EXCISION: SHX2000

## 2014-07-09 HISTORY — DX: Personal history of urinary calculi: Z87.442

## 2014-07-09 LAB — CBC
HCT: 40.8 % (ref 39.0–52.0)
Hemoglobin: 13.1 g/dL (ref 13.0–17.0)
MCH: 33.1 pg (ref 26.0–34.0)
MCHC: 32.1 g/dL (ref 30.0–36.0)
MCV: 103 fL — ABNORMAL HIGH (ref 78.0–100.0)
Platelets: 249 10*3/uL (ref 150–400)
RBC: 3.96 MIL/uL — ABNORMAL LOW (ref 4.22–5.81)
RDW: 14.2 % (ref 11.5–15.5)
WBC: 8.8 10*3/uL (ref 4.0–10.5)

## 2014-07-09 LAB — BASIC METABOLIC PANEL
ANION GAP: 10 (ref 5–15)
BUN: 7 mg/dL (ref 6–23)
CO2: 26 mmol/L (ref 19–32)
CREATININE: 0.72 mg/dL (ref 0.50–1.35)
Calcium: 8.9 mg/dL (ref 8.4–10.5)
Chloride: 101 mmol/L (ref 96–112)
GFR calc Af Amer: >90 mL/min (ref >90)
GFR calc non Af Amer: 90 mL/min — ABNORMAL LOW (ref >90)
Glucose, Bld: 129 mg/dL — ABNORMAL HIGH (ref 70–99)
Potassium: 4.2 mmol/L (ref 3.5–5.1)
SODIUM: 137 mmol/L (ref 135–145)

## 2014-07-09 LAB — GLUCOSE, CAPILLARY: GLUCOSE-CAPILLARY: 80 mg/dL (ref 70–99)

## 2014-07-09 LAB — APTT: APTT: 35 s (ref 24–37)

## 2014-07-09 LAB — PROTIME-INR
INR: 1.14 (ref 0.00–1.49)
Prothrombin Time: 14.7 seconds (ref 11.6–15.2)

## 2014-07-09 SURGERY — EXCISION MASS
Anesthesia: Monitor Anesthesia Care | Site: Elbow | Laterality: Left

## 2014-07-09 MED ORDER — ASPIRIN EC 81 MG PO TBEC
81.0000 mg | DELAYED_RELEASE_TABLET | Freq: Every day | ORAL | Status: DC
  Administered 2014-07-10 (×2): 81 mg via ORAL
  Filled 2014-07-09 (×2): qty 1

## 2014-07-09 MED ORDER — METHOCARBAMOL 1000 MG/10ML IJ SOLN
500.0000 mg | Freq: Four times a day (QID) | INTRAVENOUS | Status: DC | PRN
  Filled 2014-07-09: qty 5

## 2014-07-09 MED ORDER — BUPIVACAINE HCL (PF) 0.25 % IJ SOLN
INTRAMUSCULAR | Status: AC
  Filled 2014-07-09: qty 30

## 2014-07-09 MED ORDER — PANTOPRAZOLE SODIUM 40 MG PO TBEC
40.0000 mg | DELAYED_RELEASE_TABLET | Freq: Two times a day (BID) | ORAL | Status: DC
  Administered 2014-07-10 (×2): 40 mg via ORAL
  Filled 2014-07-09 (×2): qty 1

## 2014-07-09 MED ORDER — DOXYCYCLINE HYCLATE 100 MG PO TABS: 100.0000 mg | ORAL_TABLET | Freq: Two times a day (BID) | ORAL | Status: DC

## 2014-07-09 MED ORDER — ADULT MULTIVITAMIN W/MINERALS CH
1.0000 | ORAL_TABLET | Freq: Every day | ORAL | Status: DC
  Administered 2014-07-10 (×2): 1 via ORAL
  Filled 2014-07-09 (×2): qty 1

## 2014-07-09 MED ORDER — DILTIAZEM HCL ER COATED BEADS 180 MG PO CP24
180.0000 mg | ORAL_CAPSULE | Freq: Every day | ORAL | Status: DC
  Administered 2014-07-10: 180 mg via ORAL
  Filled 2014-07-09: qty 1

## 2014-07-09 MED ORDER — SENNOSIDES-DOCUSATE SODIUM 8.6-50 MG PO TABS
1.0000 | ORAL_TABLET | Freq: Every evening | ORAL | Status: DC | PRN
  Administered 2014-07-10: 1 via ORAL
  Filled 2014-07-09: qty 1

## 2014-07-09 MED ORDER — ONDANSETRON HCL 4 MG PO TABS: 4.0000 mg | ORAL_TABLET | Freq: Four times a day (QID) | ORAL | Status: DC | PRN

## 2014-07-09 MED ORDER — TIOTROPIUM BROMIDE MONOHYDRATE 18 MCG IN CAPS
1.0000 | ORAL_CAPSULE | Freq: Every day | RESPIRATORY_TRACT | Status: DC
  Administered 2014-07-10: 18 ug via RESPIRATORY_TRACT
  Filled 2014-07-09 (×2): qty 5

## 2014-07-09 MED ORDER — FENTANYL CITRATE 0.05 MG/ML IJ SOLN
50.0000 ug | Freq: Once | INTRAMUSCULAR | Status: AC
  Administered 2014-07-09: 50 ug via INTRAVENOUS

## 2014-07-09 MED ORDER — ALBUTEROL SULFATE (2.5 MG/3ML) 0.083% IN NEBU
INHALATION_SOLUTION | RESPIRATORY_TRACT | Status: AC
  Administered 2014-07-09: 2.5 mg via RESPIRATORY_TRACT
  Filled 2014-07-09: qty 3

## 2014-07-09 MED ORDER — MIDAZOLAM HCL 2 MG/2ML IJ SOLN
INTRAMUSCULAR | Status: AC
  Filled 2014-07-09: qty 2

## 2014-07-09 MED ORDER — MEPERIDINE HCL 25 MG/ML IJ SOLN: 6.2500 mg | INTRAMUSCULAR | Status: DC | PRN

## 2014-07-09 MED ORDER — MIDAZOLAM HCL 5 MG/ML IJ SOLN: 1.0000 mg | Freq: Once | INTRAMUSCULAR | Status: DC

## 2014-07-09 MED ORDER — CEFAZOLIN SODIUM 1-5 GM-% IV SOLN
1.0000 g | INTRAVENOUS | Status: AC
  Administered 2014-07-10: 1 g via INTRAVENOUS
  Filled 2014-07-09: qty 50

## 2014-07-09 MED ORDER — POTASSIUM CHLORIDE CRYS ER 20 MEQ PO TBCR
20.0000 meq | EXTENDED_RELEASE_TABLET | Freq: Every day | ORAL | Status: DC
  Administered 2014-07-10 (×2): 20 meq via ORAL
  Filled 2014-07-09 (×2): qty 1

## 2014-07-09 MED ORDER — SODIUM CHLORIDE 0.9 % IR SOLN
Status: DC | PRN
  Administered 2014-07-09: 1000 mL

## 2014-07-09 MED ORDER — LORATADINE 10 MG PO TABS
10.0000 mg | ORAL_TABLET | Freq: Every day | ORAL | Status: DC
  Administered 2014-07-10: 10 mg via ORAL
  Filled 2014-07-09: qty 1

## 2014-07-09 MED ORDER — FENTANYL CITRATE 0.05 MG/ML IJ SOLN
INTRAMUSCULAR | Status: AC
  Filled 2014-07-09: qty 5

## 2014-07-09 MED ORDER — FENTANYL CITRATE 0.05 MG/ML IJ SOLN: 25.0000 ug | INTRAMUSCULAR | Status: DC | PRN

## 2014-07-09 MED ORDER — OXYCODONE HCL 5 MG/5ML PO SOLN: 5.0000 mg | Freq: Once | ORAL | Status: DC | PRN

## 2014-07-09 MED ORDER — ONDANSETRON HCL 4 MG/2ML IJ SOLN: 4.0000 mg | Freq: Once | INTRAMUSCULAR | Status: DC | PRN

## 2014-07-09 MED ORDER — MORPHINE SULFATE 2 MG/ML IJ SOLN: 1.0000 mg | INTRAMUSCULAR | Status: DC | PRN

## 2014-07-09 MED ORDER — CEPHALEXIN 500 MG PO CAPS: 500.0000 mg | ORAL_CAPSULE | Freq: Three times a day (TID) | ORAL | Status: DC

## 2014-07-09 MED ORDER — VITAMIN C 500 MG PO TABS
1000.0000 mg | ORAL_TABLET | Freq: Every day | ORAL | Status: DC
  Administered 2014-07-10 (×2): 1000 mg via ORAL
  Filled 2014-07-09 (×2): qty 2

## 2014-07-09 MED ORDER — ALBUTEROL SULFATE (2.5 MG/3ML) 0.083% IN NEBU
2.5000 mg | INHALATION_SOLUTION | Freq: Once | RESPIRATORY_TRACT | Status: AC
  Administered 2014-07-09: 2.5 mg via RESPIRATORY_TRACT

## 2014-07-09 MED ORDER — MIDAZOLAM HCL 2 MG/2ML IJ SOLN
INTRAMUSCULAR | Status: AC
  Administered 2014-07-09: 1 mg
  Filled 2014-07-09: qty 2

## 2014-07-09 MED ORDER — AMIODARONE HCL 200 MG PO TABS
200.0000 mg | ORAL_TABLET | Freq: Every day | ORAL | Status: DC
  Administered 2014-07-10: 200 mg via ORAL
  Filled 2014-07-09: qty 1

## 2014-07-09 MED ORDER — ALBUTEROL SULFATE (2.5 MG/3ML) 0.083% IN NEBU: 2.5000 mg | INHALATION_SOLUTION | Freq: Four times a day (QID) | RESPIRATORY_TRACT | Status: DC | PRN

## 2014-07-09 MED ORDER — WARFARIN SODIUM 5 MG PO TABS: 2.5000 mg | ORAL_TABLET | ORAL | Status: DC

## 2014-07-09 MED ORDER — HYDROCODONE-ACETAMINOPHEN 5-325 MG PO TABS
1.0000 | ORAL_TABLET | ORAL | Status: DC | PRN
  Administered 2014-07-10: 1 via ORAL
  Filled 2014-07-09 (×2): qty 1

## 2014-07-09 MED ORDER — LACTATED RINGERS IV SOLN
INTRAVENOUS | Status: DC
  Administered 2014-07-09: 13:00:00 via INTRAVENOUS

## 2014-07-09 MED ORDER — DIPHENHYDRAMINE HCL 25 MG PO CAPS
25.0000 mg | ORAL_CAPSULE | Freq: Four times a day (QID) | ORAL | Status: DC | PRN
Start: 2014-07-09 — End: 2014-07-10

## 2014-07-09 MED ORDER — ROPIVACAINE HCL 5 MG/ML IJ SOLN
INTRAMUSCULAR | Status: DC | PRN
  Administered 2014-07-09: 30 mL via PERINEURAL

## 2014-07-09 MED ORDER — WARFARIN SODIUM 5 MG PO TABS: 5.0000 mg | ORAL_TABLET | ORAL | Status: DC

## 2014-07-09 MED ORDER — OXYCODONE HCL 5 MG PO TABS: 5.0000 mg | ORAL_TABLET | Freq: Once | ORAL | Status: DC | PRN

## 2014-07-09 MED ORDER — DOCUSATE SODIUM 100 MG PO CAPS: 100.0000 mg | ORAL_CAPSULE | Freq: Two times a day (BID) | ORAL | Status: DC

## 2014-07-09 MED ORDER — CEFAZOLIN SODIUM 1-5 GM-% IV SOLN
1.0000 g | Freq: Three times a day (TID) | INTRAVENOUS | Status: DC
  Administered 2014-07-10: 1 g via INTRAVENOUS
  Filled 2014-07-09 (×3): qty 50

## 2014-07-09 MED ORDER — OXYCODONE-ACETAMINOPHEN 5-325 MG PO TABS
ORAL_TABLET | ORAL | Status: AC
  Administered 2014-07-09: 2 via ORAL
  Filled 2014-07-09: qty 2

## 2014-07-09 MED ORDER — PROPOFOL 10 MG/ML IV BOLUS
INTRAVENOUS | Status: DC | PRN
  Administered 2014-07-09: 20 mg via INTRAVENOUS

## 2014-07-09 MED ORDER — HYDROCODONE-ACETAMINOPHEN 5-300 MG PO TABS: 1.0000 | ORAL_TABLET | Freq: Four times a day (QID) | ORAL | Status: DC | PRN

## 2014-07-09 MED ORDER — WARFARIN SODIUM 2.5 MG PO TABS: 2.5000 mg | ORAL_TABLET | Freq: Every day | ORAL | Status: DC

## 2014-07-09 MED ORDER — FENTANYL CITRATE 0.05 MG/ML IJ SOLN
INTRAMUSCULAR | Status: AC
  Filled 2014-07-09: qty 2

## 2014-07-09 MED ORDER — DOXYCYCLINE HYCLATE 100 MG PO TABS
100.0000 mg | ORAL_TABLET | Freq: Two times a day (BID) | ORAL | Status: DC
  Administered 2014-07-10 (×2): 100 mg via ORAL
  Filled 2014-07-09 (×2): qty 1

## 2014-07-09 MED ORDER — FUROSEMIDE 40 MG PO TABS
40.0000 mg | ORAL_TABLET | Freq: Every day | ORAL | Status: DC
  Administered 2014-07-10: 40 mg via ORAL
  Filled 2014-07-09: qty 1

## 2014-07-09 MED ORDER — WARFARIN - PHYSICIAN DOSING INPATIENT: Freq: Every day | Status: DC

## 2014-07-09 MED ORDER — OXYCODONE-ACETAMINOPHEN 5-325 MG PO TABS
1.0000 | ORAL_TABLET | ORAL | Status: DC | PRN
  Administered 2014-07-09: 2 via ORAL
  Administered 2014-07-10 (×2): 1 via ORAL
  Filled 2014-07-09 (×2): qty 1

## 2014-07-09 MED ORDER — ONDANSETRON HCL 4 MG/2ML IJ SOLN
4.0000 mg | Freq: Four times a day (QID) | INTRAMUSCULAR | Status: DC | PRN
Start: 2014-07-09 — End: 2014-07-10

## 2014-07-09 MED ORDER — METHOCARBAMOL 500 MG PO TABS: 500.0000 mg | ORAL_TABLET | Freq: Four times a day (QID) | ORAL | Status: DC | PRN

## 2014-07-09 MED ORDER — MOMETASONE FURO-FORMOTEROL FUM 100-5 MCG/ACT IN AERO
2.0000 | INHALATION_SPRAY | Freq: Two times a day (BID) | RESPIRATORY_TRACT | Status: DC
  Administered 2014-07-10 (×2): 2 via RESPIRATORY_TRACT
  Filled 2014-07-09 (×2): qty 8.8

## 2014-07-09 MED ORDER — KCL IN DEXTROSE-NACL 20-5-0.45 MEQ/L-%-% IV SOLN
INTRAVENOUS | Status: DC
  Administered 2014-07-09: 23:00:00 via INTRAVENOUS
  Filled 2014-07-09 (×2): qty 1000

## 2014-07-09 SURGICAL SUPPLY — 62 items
BANDAGE ELASTIC 3 VELCRO ST LF (GAUZE/BANDAGES/DRESSINGS) ×1 IMPLANT
BANDAGE ELASTIC 4 VELCRO ST LF (GAUZE/BANDAGES/DRESSINGS) ×3 IMPLANT
BNDG CMPR 9X4 STRL LF SNTH (GAUZE/BANDAGES/DRESSINGS) ×1
BNDG COHESIVE 1X5 TAN STRL LF (GAUZE/BANDAGES/DRESSINGS) IMPLANT
BNDG CONFORM 2 STRL LF (GAUZE/BANDAGES/DRESSINGS) IMPLANT
BNDG ESMARK 4X9 LF (GAUZE/BANDAGES/DRESSINGS) ×3 IMPLANT
BNDG GAUZE ELAST 4 BULKY (GAUZE/BANDAGES/DRESSINGS) ×3 IMPLANT
CORDS BIPOLAR (ELECTRODE) ×5 IMPLANT
COVER SURGICAL LIGHT HANDLE (MISCELLANEOUS) ×3 IMPLANT
CUFF TOURNIQUET SINGLE 18IN (TOURNIQUET CUFF) ×3 IMPLANT
CUFF TOURNIQUET SINGLE 24IN (TOURNIQUET CUFF) IMPLANT
DRAIN PENROSE 1/4X12 LTX STRL (WOUND CARE) IMPLANT
DRAPE SURG 17X23 STRL (DRAPES) ×3 IMPLANT
DRSG ADAPTIC 3X8 NADH LF (GAUZE/BANDAGES/DRESSINGS) ×1 IMPLANT
ELECT REM PT RETURN 9FT ADLT (ELECTROSURGICAL) ×3
ELECTRODE REM PT RTRN 9FT ADLT (ELECTROSURGICAL) IMPLANT
GAUZE SPONGE 4X4 12PLY STRL (GAUZE/BANDAGES/DRESSINGS) ×1 IMPLANT
GAUZE XEROFORM 1X8 LF (GAUZE/BANDAGES/DRESSINGS) ×1 IMPLANT
GAUZE XEROFORM 5X9 LF (GAUZE/BANDAGES/DRESSINGS) ×2 IMPLANT
GLOVE BIOGEL PI IND STRL 8.5 (GLOVE) ×1 IMPLANT
GLOVE BIOGEL PI INDICATOR 8.5 (GLOVE) ×2
GLOVE SURG ORTHO 8.0 STRL STRW (GLOVE) ×3 IMPLANT
GOWN STRL REUS W/ TWL LRG LVL3 (GOWN DISPOSABLE) ×3 IMPLANT
GOWN STRL REUS W/ TWL XL LVL3 (GOWN DISPOSABLE) ×1 IMPLANT
GOWN STRL REUS W/TWL LRG LVL3 (GOWN DISPOSABLE) ×3
GOWN STRL REUS W/TWL XL LVL3 (GOWN DISPOSABLE) ×3
HANDPIECE INTERPULSE COAX TIP (DISPOSABLE)
KIT BASIN OR (CUSTOM PROCEDURE TRAY) ×3 IMPLANT
KIT ROOM TURNOVER OR (KITS) ×3 IMPLANT
MANIFOLD NEPTUNE II (INSTRUMENTS) ×1 IMPLANT
NDL HYPO 25GX1X1/2 BEV (NEEDLE) IMPLANT
NEEDLE HYPO 25GX1X1/2 BEV (NEEDLE) IMPLANT
NS IRRIG 1000ML POUR BTL (IV SOLUTION) ×3 IMPLANT
PACK ORTHO EXTREMITY (CUSTOM PROCEDURE TRAY) ×3 IMPLANT
PAD ARMBOARD 7.5X6 YLW CONV (MISCELLANEOUS) ×4 IMPLANT
PAD CAST 3X4 CTTN HI CHSV (CAST SUPPLIES) IMPLANT
PAD CAST 4YDX4 CTTN HI CHSV (CAST SUPPLIES) ×1 IMPLANT
PADDING CAST COTTON 3X4 STRL (CAST SUPPLIES) ×3
PADDING CAST COTTON 4X4 STRL (CAST SUPPLIES) ×3
SET HNDPC FAN SPRY TIP SCT (DISPOSABLE) IMPLANT
SOAP 2 % CHG 4 OZ (WOUND CARE) ×3 IMPLANT
SPLINT FIBERGLASS 3X35 (CAST SUPPLIES) ×2 IMPLANT
SPONGE GAUZE 4X4 12PLY STER LF (GAUZE/BANDAGES/DRESSINGS) ×2 IMPLANT
SPONGE LAP 18X18 X RAY DECT (DISPOSABLE) ×3 IMPLANT
SPONGE LAP 4X18 X RAY DECT (DISPOSABLE) ×3 IMPLANT
SUCTION FRAZIER TIP 10 FR DISP (SUCTIONS) ×3 IMPLANT
SUT ETHILON 4 0 PS 2 18 (SUTURE) IMPLANT
SUT ETHILON 5 0 P 3 18 (SUTURE) ×2
SUT MNCRL AB 4-0 PS2 18 (SUTURE) ×4 IMPLANT
SUT NYLON ETHILON 5-0 P-3 1X18 (SUTURE) ×1 IMPLANT
SUT PROLENE 3 0 PS 2 (SUTURE) ×4 IMPLANT
SYR CONTROL 10ML LL (SYRINGE) IMPLANT
TAPE SURG TRANSPORE 1 IN (GAUZE/BANDAGES/DRESSINGS) IMPLANT
TAPE SURGICAL TRANSPORE 1 IN (GAUZE/BANDAGES/DRESSINGS) ×2
TOWEL OR 17X24 6PK STRL BLUE (TOWEL DISPOSABLE) ×5 IMPLANT
TOWEL OR 17X26 10 PK STRL BLUE (TOWEL DISPOSABLE) ×3 IMPLANT
TUBE ANAEROBIC SPECIMEN COL (MISCELLANEOUS) IMPLANT
TUBE CONNECTING 12'X1/4 (SUCTIONS) ×1
TUBE CONNECTING 12X1/4 (SUCTIONS) ×2 IMPLANT
UNDERPAD 30X30 INCONTINENT (UNDERPADS AND DIAPERS) ×3 IMPLANT
WATER STERILE IRR 1000ML POUR (IV SOLUTION) ×1 IMPLANT
YANKAUER SUCT BULB TIP NO VENT (SUCTIONS) ×3 IMPLANT

## 2014-07-09 NOTE — Discharge Instructions (Signed)
KEEP BANDAGE CLEAN AND DRY CALL OFFICE FOR F/U APPT [phone] IN 7 DAYS DR Caralyn Guile CELL [phone] KEEP HAND ELEVATED ABOVE HEART OK TO APPLY ICE TO OPERATIVE AREA CONTACT OFFICE IF ANY WORSENING PAIN OR CONCERNS.

## 2014-07-09 NOTE — Transfer of Care (Signed)
Immediate Anesthesia Transfer of Care Note  Patient: [name]  Procedure(s) Performed: Procedure(s): LEFT ELBOW BURSECTOMY EXCISION MASS (Left)  Patient Location: PACU  Anesthesia Type:MAC and Regional  Level of Consciousness: awake, alert , oriented and patient cooperative  Airway & Oxygen Therapy: Patient Spontanous Breathing and Patient connected to nasal cannula oxygen  Post-op Assessment: Report given to RN and Post -op Vital signs reviewed and stable  Post vital signs: Reviewed and stable  Last Vitals:  Filed Vitals:   07/09/14 1350  BP:   Pulse: 78  Temp:   Resp: 19    Complications: No apparent anesthesia complications

## 2014-07-09 NOTE — Anesthesia Procedure Notes (Signed)
Anesthesia Regional Block:  Supraclavicular block  Pre-Anesthetic Checklist: ,, timeout performed, Correct Patient, Correct Site, Correct Laterality, Correct Procedure, Correct Position, site marked, Risks and benefits discussed,  Surgical consent,  Pre-op evaluation,  At surgeon's request and post-op pain management  Laterality: Left and Upper  Prep: chloraprep       Needles:  Injection technique: Single-shot  Needle Type: Echogenic Stimulator Needle          Additional Needles:  Procedures: ultrasound guided (picture in chart) and nerve stimulator Supraclavicular block Narrative:  End time: 07/09/2014 1:26 PM Injection made incrementally with aspirations every 5 mL.  Performed by: Personally  Anesthesiologist: Alfonso Patten  Additional Notes: 1mg  of versed and 50 mcg of fentanyl given for the procedure. Patient was conversant through out the procedure

## 2014-07-09 NOTE — Anesthesia Postprocedure Evaluation (Signed)
  Anesthesia Post-op Note  Patient: [name]  Procedure(s) Performed: Procedure(s): LEFT ELBOW BURSECTOMY EXCISION MASS (Left)  Patient Location: PACU  Anesthesia Type:MAC combined with regional for post-op pain  Level of Consciousness: awake, alert  and oriented  Airway and Oxygen Therapy: Patient Spontanous Breathing  Post-op Pain: none  Post-op Assessment: Post-op Vital signs reviewed, Patient's Cardiovascular Status Stable, Respiratory Function Stable, Patent Airway and No signs of Nausea or vomiting  Post-op Vital Signs: Reviewed and stable  Last Vitals:  Filed Vitals:   07/09/14 1523  BP: 153/94  Pulse: 79  Temp: 36.6 C  Resp: 27    Complications: No apparent anesthesia complications

## 2014-07-09 NOTE — Anesthesia Preprocedure Evaluation (Addendum)
Anesthesia Evaluation  Patient identified by MRN, date of birth, ID band Patient awake    Reviewed: Allergy & Precautions, NPO status , Patient's Chart, lab work & pertinent test results  History of Anesthesia Complications Negative for: history of anesthetic complications  Airway Mallampati: II  TM Distance: >3 FB Neck ROM: Full    Dental  (+) Teeth Intact, Caps, Dental Advisory Given   Pulmonary pneumonia -, resolved, COPD COPD inhaler, Current Smoker, former smoker,  breath sounds clear to auscultation        Cardiovascular hypertension, Pt. on medications + CAD and + Past MI Rhythm:Regular Rate:Normal     Neuro/Psych    GI/Hepatic GERD-  Medicated and Controlled,  Endo/Other  diabetes  Renal/GU Renal disease     Musculoskeletal   Abdominal   Peds  Hematology   Anesthesia Other Findings   Reproductive/Obstetrics                           Anesthesia Physical  Anesthesia Plan  ASA: III  Anesthesia Plan: Regional and MAC   Post-op Pain Management: MAC Combined w/ Regional for Post-op pain   Induction:   Airway Management Planned:   Additional Equipment:   Intra-op Plan:   Post-operative Plan:   Informed Consent: I have reviewed the patients History and Physical, chart, labs and discussed the procedure including the risks, benefits and alternatives for the proposed anesthesia with the patient or authorized representative who has indicated his/her understanding and acceptance.   Dental advisory given  Plan Discussed with: CRNA and Surgeon  Anesthesia Plan Comments: (Had a lengthy discussion with the patient and his son. Patient has DNR listed. Spoke at length and the patient is good with intubation and getting vasopressor medications while in the operating room. The son also agrees with this plan Patient off coumadin since 06/27/14. )       Anesthesia Quick Evaluation

## 2014-07-10 ENCOUNTER — Encounter (HOSPITAL_COMMUNITY): Payer: Self-pay | Admitting: General Practice

## 2014-07-10 ENCOUNTER — Telehealth: Payer: Self-pay | Admitting: Family Medicine

## 2014-07-10 DIAGNOSIS — M7022 Olecranon bursitis, left elbow: Secondary | ICD-10-CM | POA: Diagnosis not present

## 2014-07-10 LAB — PROTIME-INR
INR: 1.19 (ref 0.00–1.49)
Prothrombin Time: 15.3 seconds — ABNORMAL HIGH (ref 11.6–15.2)

## 2014-07-10 NOTE — Telephone Encounter (Signed)
Cindy, please see below.

## 2014-07-10 NOTE — Telephone Encounter (Signed)
Pt called to ask when he should start back on his Coumadin. He had elbow surgery for a blood clot removed on 07/09/14 and has a fup with that  doctor 07/17/14

## 2014-07-10 NOTE — Telephone Encounter (Signed)
Reviewed pt's discharge summary.  No specific instructions regarding restarting Coumadin.  It was listed in his discharge medication list as one to continue.  Spoke with pt's son.  Suggested he follow up with Dr. Angus Palms office for specific instructions given he did the procedure yesterday. He was agreeable to this plan and will call to make an INR appt for 1 week after pt restarts Coumadin.

## 2014-07-10 NOTE — Discharge Summary (Signed)
Physician Discharge Summary  Patient ID: [name] MRN: [id number] DOB/AGE: 1941-03-24 74 y.o.  Admit date: 07/09/2014 Discharge date: 07/10/2014  Admission Diagnoses: LEFT ELBOW BURSITIS Past Medical History  Diagnosis Date  . HYPERLIPIDEMIA 11/21/2006  . HYPERTENSION 11/21/2006  . MYOCARDIAL INFARCTION, HX OF 11/21/2006  . CORONARY ARTERY DISEASE 11/21/2006  . OSTEOPOROSIS 11/21/2006  . COLONIC POLYPS 05/21/2007  . COPD 05/21/2007  . HIATAL HERNIA 05/21/2007  . DIVERTICULAR DISEASE 05/21/2007  . NEPHROLITHIASIS 05/21/2007  . GI BLEEDING 09/09/2008  . ANEMIA, B12 DEFICIENCY 12/02/2008  . CAROTID ARTERY STENOSIS 04/16/2009  . PERIPHERAL VASCULAR DISEASE 09/02/2009  . Atrial fibrillation 11/06/2009  . Allergic rhinitis   . Osteoporosis   . Hemoptysis   . Internal hemorrhoids   . GERD (gastroesophageal reflux disease)   . Cataract   . Dysrhythmia     Afib  . Shortness of breath dyspnea   . Pneumonia   . Arthritis   . History of kidney stones   . Diabetes mellitus without complication     pt. states he is not diabetic    Discharge Diagnoses:  Active Problems:   Olecranon bursitis   Surgeries: Procedure(s): LEFT ELBOW BURSECTOMY EXCISION MASS on 07/09/2014    Consultants:  none  Discharged Condition: Improved  Hospital Course: [name] is an 74 y.o. male who was admitted 07/09/2014 with a chief complaint of No chief complaint on file. , and found to have a diagnosis of LEFT ELBOW BURSITIS.  They were brought to the operating room on 07/09/2014 and underwent Procedure(s): LEFT ELBOW BURSECTOMY EXCISION MASS.    They were given perioperative antibiotics: Anti-infectives    Start     Dose/Rate Route Frequency Ordered Stop   07/10/14 0600  ceFAZolin (ANCEF) IVPB 1 g/50 mL premix     1 g 100 mL/hr over 30 Minutes Intravenous 3 times per day 07/09/14 2101     07/09/14 2200  ceFAZolin (ANCEF) IVPB 1 g/50 mL premix     1 g 100 mL/hr over 30 Minutes Intravenous NOW 07/09/14 2101  07/10/14 0100   07/09/14 2200  doxycycline (VIBRA-TABS) tablet 100 mg     100 mg Oral 2 times daily 07/09/14 1419     07/09/14 1600  cephALEXin (KEFLEX) capsule 500 mg  Status:  Discontinued     500 mg Oral 3 times daily 07/09/14 1419 07/09/14 2132   07/09/14 0600  ceFAZolin (ANCEF) IVPB 2 g/50 mL premix     2 g 100 mL/hr over 30 Minutes Intravenous On call to O.R. 07/08/14 1701 07/09/14 1423   07/09/14 0000  doxycycline (VIBRA-TABS) 100 MG tablet     100 mg Oral 2 times daily 07/09/14 1543      .  They were given sequential compression devices, early ambulation, and Other (comment) for DVT prophylaxis.  Recent vital signs: Patient Vitals for the past 24 hrs:  BP Temp Temp src Pulse Resp SpO2 Weight  07/10/14 0542 102/63 mmHg 98.4 F (36.9 C) Oral 73 17 96 % -  07/10/14 0034 117/68 mmHg 98.8 F (37.1 C) Oral 75 18 95 % -  07/09/14 2237 108/65 mmHg 99.7 F (37.6 C) Oral 70 16 99 % -  07/09/14 1945 - 98 F (36.7 C) - - - - -  07/09/14 1930 98/77 mmHg - - 71 - 97 % -  07/09/14 1815 104/71 mmHg - - 76 - 98 % -  07/09/14 1800 107/61 mmHg - - 69 - 98 % -  07/09/14 1745  107/60 mmHg - - 71 - 98 % -  07/09/14 1730 (!) 109/57 mmHg - - 73 17 98 % -  07/09/14 1715 112/67 mmHg - - 77 18 96 % -  07/09/14 1700 - - - 73 - 96 % -  07/09/14 1645 - - - 77 (!) 23 97 % -  07/09/14 1630 116/64 mmHg - - 77 (!) 21 97 % -  07/09/14 1615 101/62 mmHg - - 74 20 94 % -  07/09/14 1600 (!) 90/52 mmHg - - 78 (!) 22 95 % -  07/09/14 1545 - - - 71 (!) 22 97 % -  07/09/14 1530 113/68 mmHg - - 78 (!) 23 98 % -  07/09/14 1523 (!) 153/94 mmHg 97.9 F (36.6 C) - 79 (!) 27 97 % -  07/09/14 1350 - - - 78 19 100 % -  07/09/14 1348 128/90 mmHg - - 79 20 98 % -  07/09/14 1345 - - - 82 20 99 % -  07/09/14 1341 - - - 84 (!) 21 100 % -  07/09/14 1338 138/86 mmHg - - 77 (!) 21 100 % -  07/09/14 1335 - - - 85 (!) 21 100 % -  07/09/14 1333 127/76 mmHg - - 87 19 95 % -  07/09/14 1332 - - - 79 (!) 25 98 % -  07/09/14  1330 - - - 73 19 99 % -  07/09/14 1328 135/81 mmHg - - 78 19 100 % -  07/09/14 1326 - - - 82 20 100 % -  07/09/14 1324 127/83 mmHg - - 83 (!) 22 100 % -  07/09/14 1322 - - - 80 (!) 22 100 % -  07/09/14 1320 - - - 83 (!) 24 100 % -  07/09/14 1318 (!) 116/41 mmHg - - 84 (!) 24 98 % -  07/09/14 1315 - - - 82 (!) 22 97 % -  07/09/14 1313 115/82 mmHg - - 65 (!) 22 90 % -  07/09/14 1312 - - - 86 (!) 23 99 % -  07/09/14 1310 - - - 84 (!) 22 99 % -  07/09/14 1308 (!) 145/89 mmHg - - 86 16 100 % -  07/09/14 1303 (!) 147/87 mmHg - - 78 (!) 21 100 % -  07/09/14 1258 (!) 151/91 mmHg - - 85 (!) 24 99 % -  07/09/14 1257 - - - 83 (!) 25 97 % -  07/09/14 1147 136/78 mmHg 97.1 F (36.2 C) Oral 88 18 96 % 68.493 kg (151 lb)  .  Recent laboratory studies: No results found.  Discharge Medications:     Medication List    STOP taking these medications        aspirin 81 MG EC tablet      TAKE these medications        amiodarone 200 MG tablet  Commonly known as:  PACERONE  Take 1 tablet (200 mg total) by mouth daily. Start after finished with amiodarone 400mg  tabs     CALTRATE 600+D PLUS 600-400 MG-UNIT per tablet  Chew 1 tablet by mouth daily.     cephALEXin 500 MG capsule  Commonly known as:  KEFLEX  Take 1 capsule (500 mg total) by mouth 3 (three) times daily.     CRESTOR 20 MG tablet  Generic drug:  rosuvastatin  TAKE 1/2 TABLET BY MOUTH EVERY MORNING     cyanocobalamin 2000 MCG tablet  Commonly known as:  CVS VITAMIN B12  Take 1 tablet (2,000 mcg total) by mouth daily.     dextromethorphan-guaiFENesin 30-600 MG per 12 hr tablet  Commonly known as:  MUCINEX DM  Take 1 tablet by mouth every 12 (twelve) hours as needed. cough     diltiazem 180 MG 24 hr capsule  Commonly known as:  CARDIZEM CD  Take 1 capsule (180 mg total) by mouth daily.     docusate sodium 100 MG capsule  Commonly known as:  COLACE  Take 1 capsule (100 mg total) by mouth 2 (two) times daily.     doxycycline  100 MG tablet  Commonly known as:  VIBRA-TABS  Take 1 tablet (100 mg total) by mouth 2 (two) times daily.     doxycycline 100 MG tablet  Commonly known as:  VIBRA-TABS  Take 1 tablet (100 mg total) by mouth 2 (two) times daily.     DULERA 100-5 MCG/ACT Aero  Generic drug:  mometasone-formoterol  INHALE 2 PUFFS INTO THE LUNGS TWICE A DAY     furosemide 40 MG tablet  Commonly known as:  LASIX  Take 1 tablet (40 mg total) by mouth daily.     HYDROcodone-acetaminophen 5-325 MG per tablet  Commonly known as:  NORCO/VICODIN  Take 0.5-1 tablets by mouth every 6 (six) hours as needed.     Hydrocodone-Acetaminophen 5-300 MG Tabs  Commonly known as:  VICODIN  Take 1 tablet by mouth 4 (four) times daily as needed (PAIN).     pantoprazole 40 MG tablet  Commonly known as:  PROTONIX  TAKE 1 TABLET BY MOUTH TWICE A DAY     potassium chloride SA 20 MEQ tablet  Commonly known as:  K-DUR,KLOR-CON  Take 1 tablet (20 mEq total) by mouth daily.     SPIRIVA HANDIHALER 18 MCG inhalation capsule  Generic drug:  tiotropium  INHALE CONTENTS OF 1 CAPSULE DAILY     triamcinolone 0.025 % ointment  Commonly known as:  KENALOG  APPLY 1 A SMALL AMOUNT TO AFFECTED AREA TWICE A DAY     warfarin 5 MG tablet  Commonly known as:  COUMADIN  Take 1 tablet (5 mg total) by mouth daily.     XOPENEX HFA 45 MCG/ACT inhaler  Generic drug:  levalbuterol  INHALE 2 PUFFS INTO THE LUNGS EVERY 4 (FOUR) HOURS AS NEEDED FOR WHEEZING.     ZYRTEC ALLERGY 10 MG tablet  Generic drug:  cetirizine  Take 1 tablet (10 mg total) by mouth every morning.        Diagnostic Studies: No results found.  They benefited maximally from their hospital stay and there were no complications.     Disposition: 03-Skilled Nursing Facility      Follow-up Information    Follow up with Linna Hoff, MD. Schedule an appointment as soon as possible for a visit in 7 days.   Specialty:  Orthopedic Surgery   Contact information:    [address] [place] [address] [phone]        Signed: Linna Hoff 07/10/2014, 8:38 AM

## 2014-07-10 NOTE — Op Note (Signed)
NAMEELZY, TOMASELLO NO.:  [id number]  MEDICAL RECORD NO.:  [id number]  LOCATION:  5N09C                        FACILITY:  [facility]  PHYSICIAN:  Melrose Nakayama, MD  DATE OF BIRTH:  1940-09-02  DATE OF PROCEDURE:  07/09/2014 DATE OF DISCHARGE:                              OPERATIVE REPORT   PREOPERATIVE DIAGNOSIS:  Left elbow olecranon bursitis with large hematoma and large skin necrosis.  POSTOPERATIVE DIAGNOSIS:  Left elbow olecranon bursitis with large hematoma and large skin necrosis.  ATTENDING PHYSICIAN:  Melrose Nakayama, MD, scrubbed and present for the entire procedure.  ASSISTANT SURGEON:  None.  ANESTHESIA:  Supraclavicular block with IV sedation.  SURGICAL PROCEDURES: 1. Left elbow olecranon bursectomy. 2. Debridement of skin and subcutaneous tissue and dermis, 5 x 5 cm     skin necrosis over the posterior aspect of the elbow. 3. Left elbow local tissue rearrangement, less than 10 square     centimeters.  SURGICAL INDICATIONS:  Mr. Shadwick is a right-hand-dominant gentleman, who sustained the injury to the posterior aspect of his elbow with the large amount of bursitis and a large skin necrosis based on his hematoma. Risks, benefits and alternatives were discussed in detail with the patient and signed informed consent was obtained.  Risks include, but not limited to bleeding; infection; damage to nearby nerves, arteries, or tendons; loss of motion of the wrist and digits; incomplete relief of symptoms and need for further surgical intervention.  DESCRIPTION OF PROCEDURE:  The patient was properly identified in the preoperative holding area, marked with a permanent marker made on left elbow to indicate the correct operative site.  The patient was then brought back to the operating room and placed supine on the anesthesia table where general anesthesia was administered.  The patient tolerated this well.  A well-padded tourniquet was placed on  the left brachium and sealed with 1000-drape.  The left upper extremity was then prepped and draped in normal sterile fashion.  Time-out was called, correct side was identified, and procedure was then begun.  Please note that the patient underwent supraclavicular block, not under general anesthesia.  A well- padded tourniquet was placed on the left brachium and sealed with 1000- drape.  The left upper extremity was then prepped and draped in normal sterile fashion.  Time-out was called.  The correct site was identified and procedure was then begun.  Attention then turned to left elbow where a curvilinear incision was made directly radial over the posterior aspect of the elbow.  Excisional debridement was then carried out of the skin and subcutaneous tissue down through the dermis all the way down to the bursa excising the necrotic eschar, approximately 5 x 5 cm.  Deep dissection carried down to evacuate the hematoma.  Olecranon bursectomy was then carried out circumferentially posteriorly after evacuation of the hematoma and bursectomy.  Local tissue rearrangement was then carried out mobilizing the soft tissues in order to close the defect. Very local tissue rearrangement was then carried out.  The subcutaneous tissues undermining closed with 4-0 Monocryl suture.  The skin was then closed with a running 3-0 Prolene.  Thorough  evacuation of the hematoma was done.  The tourniquet had been deflated.  Hemostasis was obtained before closure.  Xeroform dressing and sterile compressive bandage were then applied.  The patient was placed in a well-padded anterior splint keeping the elbow in full extension, taken to the recovery room in good condition.  POSTPROCEDURAL PLAN:  The patient was admitted overnight for IV antibiotics and observation.  Discharged in the morning, seen back in the office in 1 week for wound check.  Continue with the long-arm splint until the stitches were removed and  likely 3 weeks for immobilization of the soft tissues and suture removal.     Melrose Nakayama, MD     FWO/MEDQ  D:  07/09/2014  T:  07/10/2014  Job:  [id number]

## 2014-07-10 NOTE — Progress Notes (Addendum)
Discharge instruction gave to pt and his son. All question answered and pt is ready to discharge.

## 2014-07-17 DIAGNOSIS — M7022 Olecranon bursitis, left elbow: Secondary | ICD-10-CM | POA: Diagnosis not present

## 2014-07-17 DIAGNOSIS — B9689 Other specified bacterial agents as the cause of diseases classified elsewhere: Secondary | ICD-10-CM | POA: Diagnosis not present

## 2014-07-17 DIAGNOSIS — Z4789 Encounter for other orthopedic aftercare: Secondary | ICD-10-CM | POA: Diagnosis not present

## 2014-07-24 DIAGNOSIS — M7022 Olecranon bursitis, left elbow: Secondary | ICD-10-CM | POA: Diagnosis not present

## 2014-07-24 DIAGNOSIS — Z4789 Encounter for other orthopedic aftercare: Secondary | ICD-10-CM | POA: Diagnosis not present

## 2014-07-24 DIAGNOSIS — B9689 Other specified bacterial agents as the cause of diseases classified elsewhere: Secondary | ICD-10-CM | POA: Diagnosis not present

## 2014-07-25 ENCOUNTER — Other Ambulatory Visit: Payer: Self-pay | Admitting: Family Medicine

## 2014-08-07 ENCOUNTER — Ambulatory Visit (INDEPENDENT_AMBULATORY_CARE_PROVIDER_SITE_OTHER): Payer: Medicare Other | Admitting: Family Medicine

## 2014-08-07 ENCOUNTER — Encounter: Payer: Self-pay | Admitting: Family Medicine

## 2014-08-07 VITALS — BP 144/88 | HR 76 | Temp 98.0°F | Wt 154.0 lb

## 2014-08-07 DIAGNOSIS — I480 Paroxysmal atrial fibrillation: Secondary | ICD-10-CM

## 2014-08-07 DIAGNOSIS — M7022 Olecranon bursitis, left elbow: Secondary | ICD-10-CM

## 2014-08-07 DIAGNOSIS — E785 Hyperlipidemia, unspecified: Secondary | ICD-10-CM

## 2014-08-07 DIAGNOSIS — D518 Other vitamin B12 deficiency anemias: Secondary | ICD-10-CM | POA: Diagnosis not present

## 2014-08-07 DIAGNOSIS — I10 Essential (primary) hypertension: Secondary | ICD-10-CM | POA: Diagnosis not present

## 2014-08-07 DIAGNOSIS — E119 Type 2 diabetes mellitus without complications: Secondary | ICD-10-CM

## 2014-08-07 NOTE — Assessment & Plan Note (Signed)
Mild poor control on Diltiazem, lasix 40mg . Previously losartan 50-100mg . If BP remains up over next few visits, likely restart 25-50mg  losartan.

## 2014-08-07 NOTE — Assessment & Plan Note (Signed)
Patient is going to see the [place], surgery, and Dr. Burt Knack in upcoming weeks. This is going to be a tough decision as I am concerned the coumadin contributed to extent of injury from olecranon bursa. Dr. Burt Knack and surgeon will likely discuss case together. Patient aware of elevated stroke risk off of coumadin.

## 2014-08-07 NOTE — Assessment & Plan Note (Signed)
New diagnosis reported to patient. A1c has been high since 2014 though. Patient will return in 2 months to hopefully be further out from steroid use and if <7 likely would not add medication though consider metformin with good GFR in past. Certainly contributed to by steroid use in past.

## 2014-08-07 NOTE — Progress Notes (Signed)
Garret Reddish, MD  Subjective:  [name] is a 74 y.o. year old very pleasant male patient who presents with:   DIABETES Type II-Patient unaware of diagnosis and newly listed on problem list  Lab Results  Component Value Date   HGBA1C 7.3* 05/10/2014   HGBA1C 6.6* 03/04/2013   HGBA1C 6.9* 06/04/2012  Thought to be related to steroid use in past. No current medications. Expenses already very high on patient and a lot going on trying to lower medical cost and wants to hold on starting new medicine.  Health Maintenance Due  Topic Date Due  . FOOT EXAM -next vsiit 04/14/1950  . URINE MICROALBUMIN - ordered before next vsiit 04/14/1950   ROS- Denies  Vision changes, feet or hand numbness/pain/tingling. Denies  Hypoglycemia symptoms (shaky, sweaty, hungry, weak anxious, tremor, palpitations, confusion, behavior change).   Atrial fibrillation- paroxysmal but sounds to be in a fib today, asymptomatic Remains in a fib but rate controlled. A fib on coumadin with Dr. Antionette Char aid. Sees Dr. Burt Knack in 3 weeks, surgery in 2 weeks to see if they want to restart. Recent septic olecranon bursitis now post surgery with reported bruising blackness from shoulder to hand likely contributed to by coumadin ROS-  No chest pain, palpitations  Hypertension-mild poor control this visit with control at last 2 visits. Earlier this year taken off losartan by  [facility] for BP 102/60. Continued on diltiazem and lasix 40mg .  BP Readings from Last 3 Encounters:  08/07/14 144/88  07/10/14 102/63  06/30/14 118/76   Home BP monitoring-no Compliant with medications-yes without side effects ROS-Denies any CP, HA, blurry vision, LE edema.  Past Medical History- diastolic CHF, HLD, olecranon bursitis, CAD s/p CABG, carotid artery stenosis, copd  Medications- reviewed and updated Current Outpatient Prescriptions  Medication Sig Dispense Refill  . amiodarone (PACERONE) 200 MG tablet TAKE 1 TABLET (200 MG TOTAL)  BY MOUTH DAILY. 30 tablet 5  . Calcium Carbonate-Vit D-Min (CALTRATE 600+D PLUS) 600-400 MG-UNIT per tablet Chew 1 tablet by mouth daily.      . cetirizine (ZYRTEC ALLERGY) 10 MG tablet Take 1 tablet (10 mg total) by mouth every morning.    Marland Kitchen CRESTOR 20 MG tablet TAKE 1/2 TABLET BY MOUTH EVERY MORNING (Patient taking differently: Take 10 mg by mouth every morning) 30 tablet 2  . cyanocobalamin (CVS VITAMIN B12) 2000 MCG tablet Take 1 tablet (2,000 mcg total) by mouth daily.    Marland Kitchen diltiazem (CARDIZEM CD) 180 MG 24 hr capsule Take 1 capsule (180 mg total) by mouth daily. 90 capsule 3  . docusate sodium (COLACE) 100 MG capsule Take 1 capsule (100 mg total) by mouth 2 (two) times daily. 30 capsule 0  . DULERA 100-5 MCG/ACT AERO INHALE 2 PUFFS INTO THE LUNGS TWICE A DAY (Patient taking differently: Inhale 2 puffs into the lungs twice daily) 13 g 2  . furosemide (LASIX) 40 MG tablet Take 1 tablet (40 mg total) by mouth daily. 30 tablet 3  . pantoprazole (PROTONIX) 40 MG tablet TAKE 1 TABLET BY MOUTH TWICE A DAY 180 tablet 1  . potassium chloride SA (K-DUR,KLOR-CON) 20 MEQ tablet Take 1 tablet (20 mEq total) by mouth daily. 30 tablet 3  . SPIRIVA HANDIHALER 18 MCG inhalation capsule INHALE CONTENTS OF 1 CAPSULE DAILY 30 capsule 0  . warfarin (COUMADIN) 5 MG tablet Take 1 tablet (5 mg total) by mouth daily. (Patient taking differently: Take 2.5-5 mg by mouth daily. Take 2.5 mg by mouth on  Sunday, Monday, Wednesday,Friday and Saturday . Take 5 mg by mouth on all other days.) 30 tablet 6  . XOPENEX HFA 45 MCG/ACT inhaler INHALE 2 PUFFS INTO THE LUNGS EVERY 4 (FOUR) HOURS AS NEEDED FOR WHEEZING. 15 Inhaler 2  . triamcinolone (KENALOG) 0.025 % ointment APPLY 1 A SMALL AMOUNT TO AFFECTED AREA TWICE A DAY (Patient not taking: Reported on 08/07/2014) 80 g 2   No current facility-administered medications for this visit.    Objective: BP 144/88 mmHg  Pulse 76  Temp(Src) 98 F (36.7 C)  Wt 154 lb (69.854 kg)   SpO2 96% Gen: NAD, resting comfortably, appears older than stated age CV: irregularly irregular no murmurs rubs or gallops Lungs: CTAB no crackles, wheeze, rhonchi Abdomen: soft/nontender/nondistended/normal bowel sounds. No rebound or guarding.  Ext: no edema, Left elbow with healing at least 8 cm scar. Still with tenderness over olecranon bursa but incredibly improved from previous.  Skin: warm, dry, no rash   Assessment/Plan:  Diabetes mellitus type II, controlled New diagnosis reported to patient. A1c has been high since 2014 though. Patient will return in 2 months to hopefully be further out from steroid use and if <7 likely would not add medication though consider metformin with good GFR in past. Certainly contributed to by steroid use in past.    Atrial fibrillation Patient is going to see the [place], surgery, and Dr. Burt Knack in upcoming weeks. This is going to be a tough decision as I am concerned the coumadin contributed to extent of injury from olecranon bursa. Dr. Burt Knack and surgeon will likely discuss case together. Patient aware of elevated stroke risk off of coumadin.    Essential hypertension Mild poor control on Diltiazem, lasix 40mg . Previously losartan 50-100mg . If BP remains up over next few visits, likely restart 25-50mg  losartan.     2 month f/u thoughStrong financial difficulties with recent hospitalizations and costs of medicines. May transition care to [facility]. Labs before visit  Orders Placed This Encounter  Procedures  . Vitamin B12    Standing Status: Future     Number of Occurrences:      Standing Expiration Date: 08/07/2015  . Hemoglobin A1c    [place]    Standing Status: Future     Number of Occurrences:      Standing Expiration Date: 08/07/2015  . Lipid panel    [place]    Standing Status: Future     Number of Occurrences:      Standing Expiration Date: 08/07/2015    Order Specific Question:  Has the patient fasted?    Answer:  No  . Basic metabolic panel     [place]    Standing Status: Future     Number of Occurrences:      Standing Expiration Date: 08/07/2015    Order Specific Question:  Has the patient fasted?    Answer:  No  . Microalbumin / creatinine urine ratio    [place]    Standing Status: Future     Number of Occurrences:      Standing Expiration Date: 08/07/2015

## 2014-08-07 NOTE — Patient Instructions (Addendum)
Today, blood pressure 144/88 but has been low in past and taken off of losartan. Want to watch this closely and may restart low dose losartan if regularly above 140.   See me in 2 months with fasting labs a few days before.   I truly hope you can find lower cost treatment under care of the [facility] and I am sorry for the financial burden of your medical care recently.   I wish we had more options to lower your costs

## 2014-08-14 DIAGNOSIS — M7022 Olecranon bursitis, left elbow: Secondary | ICD-10-CM | POA: Diagnosis not present

## 2014-08-14 DIAGNOSIS — Z4789 Encounter for other orthopedic aftercare: Secondary | ICD-10-CM | POA: Diagnosis not present

## 2014-08-14 DIAGNOSIS — B9689 Other specified bacterial agents as the cause of diseases classified elsewhere: Secondary | ICD-10-CM | POA: Diagnosis not present

## 2014-08-15 ENCOUNTER — Other Ambulatory Visit: Payer: Self-pay | Admitting: Cardiovascular Disease

## 2014-08-18 ENCOUNTER — Telehealth: Payer: Self-pay | Admitting: Cardiovascular Disease

## 2014-08-18 ENCOUNTER — Ambulatory Visit (INDEPENDENT_AMBULATORY_CARE_PROVIDER_SITE_OTHER): Payer: Medicare Other | Admitting: *Deleted

## 2014-08-18 ENCOUNTER — Telehealth: Payer: Self-pay | Admitting: *Deleted

## 2014-08-18 DIAGNOSIS — I481 Persistent atrial fibrillation: Secondary | ICD-10-CM

## 2014-08-18 DIAGNOSIS — I480 Paroxysmal atrial fibrillation: Secondary | ICD-10-CM

## 2014-08-18 DIAGNOSIS — I4819 Other persistent atrial fibrillation: Secondary | ICD-10-CM

## 2014-08-18 LAB — POCT INR: INR: 1

## 2014-08-18 NOTE — Telephone Encounter (Signed)
-----   Message from Sherren Mocha, MD sent at 08/18/2014 11:37 AM EDT ----- Regarding: RE: coumadin restart I would probably wait until we get him in to the office and go over pros and cons since he may have had a bleeding event into the elbow. thx ----- Message -----    From: Zenovia Jarred, RN    Sent: 08/18/2014  10:18 AM      To: Sherren Mocha, MD Subject: coumadin restart                               Came in today to Coumadin clinic, he has been off since surgery which was 07/09/14  for LEFT ELBOW BURSECTOMY EXCISION MASS . MD's questioning coumadin was the culprit, is he ok to restart? He was pending DCCV, lauren states she will get him an appt in few weeks for DCCV plan  with you if he starts today.

## 2014-08-18 NOTE — Telephone Encounter (Signed)
Pt aware that Dr Burt Knack wants to see him first before deciding about restarting Coumadin and that Ander Purpura, nurse will call to set that MD appt up.

## 2014-08-18 NOTE — Telephone Encounter (Signed)
New message       Talk to [facility]

## 2014-08-18 NOTE — Telephone Encounter (Signed)
Telephoned pt back and informed him that per Dr Burt Knack we will hold on restarting Coumadin until after an office visit with him and Lauren will call to scheduled.

## 2014-08-19 ENCOUNTER — Ambulatory Visit (INDEPENDENT_AMBULATORY_CARE_PROVIDER_SITE_OTHER): Payer: Medicare Other | Admitting: Cardiovascular Disease

## 2014-08-19 ENCOUNTER — Other Ambulatory Visit: Payer: Self-pay

## 2014-08-19 ENCOUNTER — Encounter: Payer: Self-pay | Admitting: Cardiovascular Disease

## 2014-08-19 VITALS — BP 130/76 | HR 72 | Ht 63.0 in | Wt 151.1 lb

## 2014-08-19 DIAGNOSIS — I482 Chronic atrial fibrillation, unspecified: Secondary | ICD-10-CM

## 2014-08-19 MED ORDER — DABIGATRAN ETEXILATE MESYLATE 150 MG PO CAPS: 150.0000 mg | ORAL_CAPSULE | Freq: Two times a day (BID) | ORAL | Status: DC

## 2014-08-19 NOTE — Patient Instructions (Addendum)
Medication Instructions:  Your physician has recommended you make the following change in your medication:  1. STOP Aspirin 2. START Pradaxa 150mg  take one by mouth twice a day  Labwork: No new orders.  Testing/Procedures: No new orders.  Follow-Up: Your physician recommends that you schedule a follow-up appointment in: 4-6 WEEKS with PA/NP   Any Other Special Instructions Will Be Listed Below (If Applicable).

## 2014-08-19 NOTE — Progress Notes (Signed)
Cardiology Office Note   Date:  08/19/2014   ID:  [name], DOB May 09, 1940, MRN [id number]  PCP:  Garret Reddish, MD  Cardiologist:  Sherren Mocha, MD    Chief Complaint  Patient presents with  . Follow-up    afib, poss cardioversion    History of Present Illness: [name] is a 74 y.o. male who presents for follow-up of atrial fibrillation, CAD status post CABG, diabetes, peripheral vascular disease, and severe COPD. The patient had acute on chronic respiratory failure thought to be exacerbated by atrial fibrillation with RVR. He underwent cardioversion but reverted to atrial fibrillation. He was then started on amiodarone. It was difficult to achieve a therapeutic INR and cardioversion on amiodarone has not been performed yet. The patient underwent elbow surgery last month and there was hematoma noted. There is a question of whether Coumadin cause this problem. He is yet to restart Coumadin since his surgery last month.   the patient reports stable but significant dyspnea. He's had no chest pain or pressure. He denies leg swelling. He has not had any further bleeding problems. He does report episodes of tachycardia palpitations and associated lightheadedness that occur about twice per week.  Past Medical History  Diagnosis Date  . HYPERLIPIDEMIA 11/21/2006  . HYPERTENSION 11/21/2006  . MYOCARDIAL INFARCTION, HX OF 11/21/2006  . CORONARY ARTERY DISEASE 11/21/2006  . OSTEOPOROSIS 11/21/2006  . COLONIC POLYPS 05/21/2007  . COPD 05/21/2007  . HIATAL HERNIA 05/21/2007  . DIVERTICULAR DISEASE 05/21/2007  . NEPHROLITHIASIS 05/21/2007  . GI BLEEDING 09/09/2008  . ANEMIA, B12 DEFICIENCY 12/02/2008  . CAROTID ARTERY STENOSIS 04/16/2009  . PERIPHERAL VASCULAR DISEASE 09/02/2009  . Atrial fibrillation 11/06/2009  . Allergic rhinitis   . Osteoporosis   . Hemoptysis   . Internal hemorrhoids   . GERD (gastroesophageal reflux disease)   . Cataract   . Dysrhythmia     Afib  . Shortness of  breath dyspnea   . Pneumonia   . Arthritis   . History of kidney stones   . Diabetes mellitus without complication     pt. states he is not diabetic    Past Surgical History  Procedure Laterality Date  . Thyroidectomy      partial  . Coronary stent placement      03/07/2001  . Cholecystectomy    . Appendectomy    . Hemorrhoid surgery    . Inguinal hernia repair  10/10/08  . Coronary artery bypass graft  2011  . Ptca    . Cardioversion N/A 04/16/2014    Procedure: CARDIOVERSION;  Surgeon: Josue Hector, MD;  Location: [facility];  Service: Cardiovascular;  Laterality: N/A;  . Olecranon bursectomy  07/09/2014  . Mass excision Left 07/09/2014    Procedure: LEFT ELBOW BURSECTOMY EXCISION MASS;  Surgeon: Iran Planas, MD;  Location: [facility];  Service: Orthopedics;  Laterality: Left;    Current Outpatient Prescriptions  Medication Sig Dispense Refill  . amiodarone (PACERONE) 200 MG tablet TAKE 1 TABLET (200 MG TOTAL) BY MOUTH DAILY. 30 tablet 5  . aspirin 81 MG tablet Take 81 mg by mouth daily.    . Calcium Carbonate-Vit D-Min (CALTRATE 600+D PLUS) 600-400 MG-UNIT per tablet Chew 1 tablet by mouth daily.      . cetirizine (ZYRTEC ALLERGY) 10 MG tablet Take 1 tablet (10 mg total) by mouth every morning.    . cyanocobalamin ([employer] VITAMIN B12) 2000 MCG tablet Take 1 tablet (2,000 mcg total) by mouth daily.    Marland Kitchen  diltiazem (CARDIZEM CD) 180 MG 24 hr capsule Take 1 capsule (180 mg total) by mouth daily. 90 capsule 3  . DULERA 100-5 MCG/ACT AERO INHALE 2 PUFFS INTO THE LUNGS TWICE A DAY 13 g 2  . furosemide (LASIX) 40 MG tablet Take 1 tablet (40 mg total) by mouth daily. 30 tablet 3  . KLOR-CON M20 20 MEQ tablet TAKE 1 TABLET BY MOUTH DAILY 90 tablet 0  . pantoprazole (PROTONIX) 40 MG tablet TAKE 1 TABLET BY MOUTH TWICE A DAY 180 tablet 1  . potassium chloride SA (K-DUR,KLOR-CON) 20 MEQ tablet Take 1 tablet (20 mEq total) by mouth daily. 30 tablet 3  . rosuvastatin (CRESTOR) 20 MG tablet Take  20 mg by mouth daily.    Marland Kitchen SPIRIVA HANDIHALER 18 MCG inhalation capsule INHALE CONTENTS OF 1 CAPSULE DAILY 30 capsule 0  . triamcinolone (KENALOG) 0.025 % ointment APPLY 1 A SMALL AMOUNT TO AFFECTED AREA TWICE A DAY 80 g 2  . XOPENEX HFA 45 MCG/ACT inhaler INHALE 2 PUFFS INTO THE LUNGS EVERY 4 (FOUR) HOURS AS NEEDED FOR WHEEZING. 15 Inhaler 2  . warfarin (COUMADIN) 5 MG tablet Take 1 tablet (5 mg total) by mouth daily. (Patient not taking: Reported on 08/19/2014) 30 tablet 6   No current facility-administered medications for this visit.    Allergies:   Xarelto; Ticlopidine hcl; and Lorazepam   Social History:  The patient  reports that he quit smoking about 4 years ago. His smoking use included Cigarettes. He has a 150 pack-year smoking history. He has never used smokeless tobacco. He reports that he drinks about 3.0 oz of alcohol per week. He reports that he does not use illicit drugs.   Family History:  The patient's  family history includes Arthritis in his mother; Colon cancer (age of onset: 62) in his brother; Heart attack in his father; Heart disease in his brother, brother, and father. There is no history of Stomach cancer.    ROS:  Please see the history of present illness.  Otherwise, review of systems is positive for  Orthopnea, PND, exertional dyspnea, cough, dizziness, easy bruising, palpitations, snoring, wheezing, balance problems.  All other systems are reviewed and negative.    PHYSICAL EXAM: VS:  BP 130/76 mmHg  Pulse 72  Ht 5\' 3"  (1.6 m)  Wt 151 lb 1.9 oz (68.548 kg)  BMI 26.78 kg/m2 , BMI Body mass index is 26.78 kg/(m^2). GEN: Well nourished, well developed,  Elderly appearing malein no acute distress HEENT: normal Neck: no JVD, no masses. No carotid bruits Cardiac:  Irregularly irregular without murmur or gallop , distant heart sounds                Respiratory:  clear to auscultation bilaterally, normal work of breathing,  Poor air movement bilaterally GI: soft,  nontender, nondistended, + BS MS: no deformity or atrophy Ext: no pretibial edema Skin: warm and dry, no rash Neuro:  Strength and sensation are intact Psych: euthymic mood, full affect  EKG:  EKG is ordered today. The ekg ordered today shows atrial fibrillation 72 bpm, PVC's, age-indeterminate anteroseptal infarct.   Recent Labs: 03/17/2014: Pro B Natriuretic peptide (BNP) 142.0* 05/10/2014: TSH 0.674 05/13/2014: B Natriuretic Peptide 309.4* 05/16/2014: Magnesium 2.2 06/30/2014: ALT 15 07/09/2014: BUN 7; Creatinine 0.72; Hemoglobin 13.1; Platelets 249; Potassium 4.2; Sodium 137   Lipid Panel     Component Value Date/Time   CHOL 135 06/04/2012 0920   TRIG 109.0 06/04/2012 0920   HDL 38.80* 06/04/2012  0920   CHOLHDL 3 06/04/2012 0920   VLDL 21.8 06/04/2012 0920   LDLCALC 74 06/04/2012 0920      Wt Readings from Last 3 Encounters:  08/19/14 151 lb 1.9 oz (68.548 kg)  08/07/14 154 lb (69.854 kg)  07/09/14 151 lb (68.493 kg)     Cardiac Studies Reviewed:  2-D echocardiogram 02/21/2014: Study Conclusions  - Left ventricle: The cavity size was normal. There was mild concentric hypertrophy. Systolic function was normal. The estimated ejection fraction was in the range of 55% to 60%. Wall motion was normal; there were no regional wall motion abnormalities. - Mitral valve: Calcified annulus. Mildly thickened leaflets . - Left atrium: The atrium was mildly dilated.   ASSESSMENT AND PLAN: 1.  Atrial fibrillation, persistent. Lengthy discussion with the patient and his son today about diagnostic and treatment options. I carefully reviewed the pros and cons of oral anticoagulation. Considering his high CHADS-Vasc score or 5, anticoagulation is clearly indicated. As detailed in the history of present illness, there is concern about bleeding risk. He had difficulty with regulation of his INR on warfarin. He would much prefer a direct oral anticoagulation drug. As he wants to  obtain medications through the [place] system, all prescribed Pradaxa 150 mg twice daily. We have also discussed the pros and cons of rate control versus rhythm control. He has failed cardioversion, but now is on antiarrhythmics therapy with amiodarone. I think it would be reasonable to repeat cardioversion after 3 weeks of anticoagulation. However, his heart rate is better controlled now and a rate control strategy would also be acceptable. He will return in 6 weeks for PA/NP follow-up. If he desires cardioversion, it could be scheduled at that time as long as he has been on oral anticoagulation for at least 3 weeks.  This patients CHA2DS2-VASc Score and unadjusted Ischemic Stroke Rate (% per year) is equal to 7.2 % stroke rate/year from a score of 5  Above score calculated as 1 point each if present [CHF, HTN, DM, Vascular=MI/PAD/Aortic Plaque, Age if 65-74, or Male] Above score calculated as 2 points each if present [Age > 75, or Stroke/TIA/TE]  2. CAD status post CABG: No anginal symptoms. Continue same therapy. Advised to stop aspirin to reduce bleeding risk in the setting of oral anticoagulation.  Not a candidate for a beta blocker because of advanced lung disease.  3. Chronic diastolic heart failure: Appears stable. Exam is most remarkable for COPD.  No evidence of volume overload. Continue furosemide 40 mg daily.   4. Essential hypertension: Controlled. Continue same medical program.   5. Hyperlipidemia: patient takes Crestor 20 mg daily. Most recent lipids reviewed and at goal. It would be reasonable to change to a generic statin or something that is on the [place] if needed.  Current medicines are reviewed with the patient today.  The patient does not have concerns regarding medicines.  Labs/ tests ordered today include:  No orders of the defined types were placed in this encounter.    Disposition:   FU 6 weeks with APP  Signed, Sherren Mocha, MD  08/19/2014 10:05 AM    [facility] [address], [place], [place]  [address] Phone: [phone]; Fax: [phone]

## 2014-08-20 ENCOUNTER — Other Ambulatory Visit: Payer: Self-pay

## 2014-08-20 ENCOUNTER — Telehealth: Payer: Self-pay | Admitting: Cardiovascular Disease

## 2014-08-20 DIAGNOSIS — I482 Chronic atrial fibrillation, unspecified: Secondary | ICD-10-CM

## 2014-08-20 MED ORDER — DABIGATRAN ETEXILATE MESYLATE 150 MG PO CAPS: 150.0000 mg | ORAL_CAPSULE | Freq: Two times a day (BID) | ORAL | Status: DC

## 2014-08-20 NOTE — Telephone Encounter (Signed)
New Message  Pt was seen yesterday 5/24 and was given new Rx for Predaxa 150 mg. [facility] and said [facility] would need to be [facility]ed the Rx. Fax # [phone] ATTN: Dr. Reubin Milan.

## 2014-08-20 NOTE — Telephone Encounter (Signed)
I spoke with the pt's son and made him aware that I will fax Rx and office note to the [place].

## 2014-08-23 ENCOUNTER — Inpatient Hospital Stay (HOSPITAL_COMMUNITY)
Admission: EM | Admit: 2014-08-23 | Discharge: 2014-08-29 | DRG: 312 | Disposition: A | Discharge status: Skilled Nursing Facility | Payer: Medicare Other | Attending: Internal Medicine | Admitting: Internal Medicine

## 2014-08-23 ENCOUNTER — Emergency Department (HOSPITAL_COMMUNITY): Payer: Medicare Other

## 2014-08-23 ENCOUNTER — Encounter (HOSPITAL_COMMUNITY): Payer: Self-pay | Admitting: Emergency Medicine

## 2014-08-23 ENCOUNTER — Inpatient Hospital Stay (HOSPITAL_COMMUNITY): Payer: Medicare Other

## 2014-08-23 DIAGNOSIS — E119 Type 2 diabetes mellitus without complications: Secondary | ICD-10-CM | POA: Diagnosis present

## 2014-08-23 DIAGNOSIS — Z87891 Personal history of nicotine dependence: Secondary | ICD-10-CM

## 2014-08-23 DIAGNOSIS — Z7982 Long term (current) use of aspirin: Secondary | ICD-10-CM

## 2014-08-23 DIAGNOSIS — Z79899 Other long term (current) drug therapy: Secondary | ICD-10-CM

## 2014-08-23 DIAGNOSIS — R062 Wheezing: Secondary | ICD-10-CM | POA: Diagnosis not present

## 2014-08-23 DIAGNOSIS — E538 Deficiency of other specified B group vitamins: Secondary | ICD-10-CM | POA: Diagnosis present

## 2014-08-23 DIAGNOSIS — E785 Hyperlipidemia, unspecified: Secondary | ICD-10-CM | POA: Diagnosis present

## 2014-08-23 DIAGNOSIS — Z7901 Long term (current) use of anticoagulants: Secondary | ICD-10-CM

## 2014-08-23 DIAGNOSIS — M25552 Pain in left hip: Secondary | ICD-10-CM | POA: Diagnosis not present

## 2014-08-23 DIAGNOSIS — J432 Centrilobular emphysema: Secondary | ICD-10-CM | POA: Diagnosis not present

## 2014-08-23 DIAGNOSIS — I2510 Atherosclerotic heart disease of native coronary artery without angina pectoris: Secondary | ICD-10-CM | POA: Diagnosis present

## 2014-08-23 DIAGNOSIS — J440 Chronic obstructive pulmonary disease with acute lower respiratory infection: Secondary | ICD-10-CM | POA: Diagnosis not present

## 2014-08-23 DIAGNOSIS — R55 Syncope and collapse: Principal | ICD-10-CM | POA: Diagnosis present

## 2014-08-23 DIAGNOSIS — I482 Chronic atrial fibrillation, unspecified: Secondary | ICD-10-CM | POA: Diagnosis present

## 2014-08-23 DIAGNOSIS — I5030 Unspecified diastolic (congestive) heart failure: Secondary | ICD-10-CM | POA: Diagnosis not present

## 2014-08-23 DIAGNOSIS — Z515 Encounter for palliative care: Secondary | ICD-10-CM | POA: Diagnosis not present

## 2014-08-23 DIAGNOSIS — I2699 Other pulmonary embolism without acute cor pulmonale: Secondary | ICD-10-CM

## 2014-08-23 DIAGNOSIS — J438 Other emphysema: Secondary | ICD-10-CM | POA: Diagnosis not present

## 2014-08-23 DIAGNOSIS — R0602 Shortness of breath: Secondary | ICD-10-CM | POA: Diagnosis not present

## 2014-08-23 DIAGNOSIS — W19XXXA Unspecified fall, initial encounter: Secondary | ICD-10-CM

## 2014-08-23 DIAGNOSIS — J449 Chronic obstructive pulmonary disease, unspecified: Secondary | ICD-10-CM | POA: Diagnosis not present

## 2014-08-23 DIAGNOSIS — J441 Chronic obstructive pulmonary disease with (acute) exacerbation: Secondary | ICD-10-CM | POA: Diagnosis not present

## 2014-08-23 DIAGNOSIS — I10 Essential (primary) hypertension: Secondary | ICD-10-CM | POA: Diagnosis not present

## 2014-08-23 DIAGNOSIS — J9621 Acute and chronic respiratory failure with hypoxia: Secondary | ICD-10-CM | POA: Diagnosis present

## 2014-08-23 DIAGNOSIS — T148 Other injury of unspecified body region: Secondary | ICD-10-CM | POA: Diagnosis not present

## 2014-08-23 DIAGNOSIS — M545 Low back pain, unspecified: Secondary | ICD-10-CM

## 2014-08-23 DIAGNOSIS — S0990XA Unspecified injury of head, initial encounter: Secondary | ICD-10-CM | POA: Diagnosis not present

## 2014-08-23 DIAGNOSIS — I5032 Chronic diastolic (congestive) heart failure: Secondary | ICD-10-CM | POA: Diagnosis present

## 2014-08-23 DIAGNOSIS — S3992XA Unspecified injury of lower back, initial encounter: Secondary | ICD-10-CM | POA: Diagnosis not present

## 2014-08-23 DIAGNOSIS — S59902A Unspecified injury of left elbow, initial encounter: Secondary | ICD-10-CM | POA: Diagnosis not present

## 2014-08-23 DIAGNOSIS — M546 Pain in thoracic spine: Secondary | ICD-10-CM | POA: Diagnosis not present

## 2014-08-23 DIAGNOSIS — Z9981 Dependence on supplemental oxygen: Secondary | ICD-10-CM | POA: Diagnosis not present

## 2014-08-23 DIAGNOSIS — J90 Pleural effusion, not elsewhere classified: Secondary | ICD-10-CM | POA: Diagnosis not present

## 2014-08-23 DIAGNOSIS — I252 Old myocardial infarction: Secondary | ICD-10-CM | POA: Diagnosis not present

## 2014-08-23 DIAGNOSIS — M4856XA Collapsed vertebra, not elsewhere classified, lumbar region, initial encounter for fracture: Secondary | ICD-10-CM | POA: Diagnosis present

## 2014-08-23 DIAGNOSIS — R0600 Dyspnea, unspecified: Secondary | ICD-10-CM

## 2014-08-23 DIAGNOSIS — M5440 Lumbago with sciatica, unspecified side: Secondary | ICD-10-CM | POA: Diagnosis not present

## 2014-08-23 DIAGNOSIS — S79912A Unspecified injury of left hip, initial encounter: Secondary | ICD-10-CM | POA: Diagnosis not present

## 2014-08-23 DIAGNOSIS — Z951 Presence of aortocoronary bypass graft: Secondary | ICD-10-CM

## 2014-08-23 DIAGNOSIS — M25522 Pain in left elbow: Secondary | ICD-10-CM | POA: Diagnosis not present

## 2014-08-23 DIAGNOSIS — R0902 Hypoxemia: Secondary | ICD-10-CM | POA: Diagnosis not present

## 2014-08-23 DIAGNOSIS — S79911A Unspecified injury of right hip, initial encounter: Secondary | ICD-10-CM | POA: Diagnosis not present

## 2014-08-23 DIAGNOSIS — Z955 Presence of coronary angioplasty implant and graft: Secondary | ICD-10-CM

## 2014-08-23 DIAGNOSIS — S32010A Wedge compression fracture of first lumbar vertebra, initial encounter for closed fracture: Secondary | ICD-10-CM | POA: Insufficient documentation

## 2014-08-23 DIAGNOSIS — R4020 Unspecified coma: Secondary | ICD-10-CM | POA: Diagnosis not present

## 2014-08-23 DIAGNOSIS — Z66 Do not resuscitate: Secondary | ICD-10-CM | POA: Diagnosis not present

## 2014-08-23 DIAGNOSIS — S32010D Wedge compression fracture of first lumbar vertebra, subsequent encounter for fracture with routine healing: Secondary | ICD-10-CM | POA: Diagnosis not present

## 2014-08-23 DIAGNOSIS — S299XXA Unspecified injury of thorax, initial encounter: Secondary | ICD-10-CM | POA: Diagnosis not present

## 2014-08-23 DIAGNOSIS — M25551 Pain in right hip: Secondary | ICD-10-CM | POA: Diagnosis not present

## 2014-08-23 DIAGNOSIS — J9622 Acute and chronic respiratory failure with hypercapnia: Secondary | ICD-10-CM | POA: Diagnosis not present

## 2014-08-23 DIAGNOSIS — Z9181 History of falling: Secondary | ICD-10-CM | POA: Diagnosis not present

## 2014-08-23 DIAGNOSIS — Z794 Long term (current) use of insulin: Secondary | ICD-10-CM | POA: Diagnosis not present

## 2014-08-23 HISTORY — DX: Syncope and collapse: R55

## 2014-08-23 HISTORY — DX: Low back pain, unspecified: M54.50

## 2014-08-23 HISTORY — DX: Essential (primary) hypertension: I10

## 2014-08-23 LAB — COMPREHENSIVE METABOLIC PANEL
ALT: 15 U/L — ABNORMAL LOW (ref 17–63)
AST: 22 U/L (ref 15–41)
Albumin: 3.3 g/dL — ABNORMAL LOW (ref 3.5–5.0)
Alkaline Phosphatase: 48 U/L (ref 38–126)
Anion gap: 10 (ref 5–15)
BUN: 10 mg/dL (ref 6–20)
CO2: 25 mmol/L (ref 22–32)
Calcium: 8.1 mg/dL — ABNORMAL LOW (ref 8.9–10.3)
Chloride: 100 mmol/L — ABNORMAL LOW (ref 101–111)
Creatinine, Ser: 0.85 mg/dL (ref 0.61–1.24)
GLUCOSE: 136 mg/dL — AB (ref 65–99)
Potassium: 3.9 mmol/L (ref 3.5–5.1)
SODIUM: 135 mmol/L (ref 135–145)
Total Bilirubin: 0.8 mg/dL (ref 0.3–1.2)
Total Protein: 6 g/dL — ABNORMAL LOW (ref 6.5–8.1)

## 2014-08-23 LAB — URINALYSIS, ROUTINE W REFLEX MICROSCOPIC
BILIRUBIN URINE: NEGATIVE
GLUCOSE, UA: NEGATIVE mg/dL
Ketones, ur: NEGATIVE mg/dL
Leukocytes, UA: NEGATIVE
NITRITE: NEGATIVE
Protein, ur: NEGATIVE mg/dL
SPECIFIC GRAVITY, URINE: 1.015 (ref 1.005–1.030)
Urobilinogen, UA: 1 mg/dL (ref 0.0–1.0)
pH: 6 (ref 5.0–8.0)

## 2014-08-23 LAB — CBC WITH DIFFERENTIAL/PLATELET
BASOS PCT: 0 % (ref 0–1)
Basophils Absolute: 0 10*3/uL (ref 0.0–0.1)
Basophils Absolute: 0 10*3/uL (ref 0.0–0.1)
Basophils Relative: 0 % (ref 0–1)
EOS ABS: 0.1 10*3/uL (ref 0.0–0.7)
Eosinophils Absolute: 0 10*3/uL (ref 0.0–0.7)
Eosinophils Relative: 1 % (ref 0–5)
Eosinophils Relative: 0 % (ref 0–5)
HEMATOCRIT: 41.6 % (ref 39.0–52.0)
HEMATOCRIT: 39.3 % (ref 39.0–52.0)
HEMOGLOBIN: 12.5 g/dL — AB (ref 13.0–17.0)
Hemoglobin: 13.5 g/dL (ref 13.0–17.0)
LYMPHS ABS: 0.8 10*3/uL (ref 0.7–4.0)
Lymphocytes Relative: 20 % (ref 12–46)
Lymphocytes Relative: 9 % — ABNORMAL LOW (ref 12–46)
Lymphs Abs: 2.1 10*3/uL (ref 0.7–4.0)
MCH: 32.2 pg (ref 26.0–34.0)
MCH: 31.6 pg (ref 26.0–34.0)
MCHC: 32.5 g/dL (ref 30.0–36.0)
MCHC: 31.8 g/dL (ref 30.0–36.0)
MCV: 99.3 fL (ref 78.0–100.0)
MCV: 99.5 fL (ref 78.0–100.0)
MONO ABS: 0.9 10*3/uL (ref 0.1–1.0)
Monocytes Absolute: 0.9 10*3/uL (ref 0.1–1.0)
Monocytes Relative: 8 % (ref 3–12)
Monocytes Relative: 10 % (ref 3–12)
NEUTROS PCT: 81 % — AB (ref 43–77)
Neutro Abs: 7.8 10*3/uL — ABNORMAL HIGH (ref 1.7–7.7)
Neutro Abs: 7.5 10*3/uL (ref 1.7–7.7)
Neutrophils Relative %: 71 % (ref 43–77)
PLATELETS: 189 10*3/uL (ref 150–400)
Platelets: 191 10*3/uL (ref 150–400)
RBC: 4.19 MIL/uL — ABNORMAL LOW (ref 4.22–5.81)
RBC: 3.95 MIL/uL — ABNORMAL LOW (ref 4.22–5.81)
RDW: 14 % (ref 11.5–15.5)
RDW: 13.9 % (ref 11.5–15.5)
WBC: 11 10*3/uL — AB (ref 4.0–10.5)
WBC: 9.3 10*3/uL (ref 4.0–10.5)

## 2014-08-23 LAB — BASIC METABOLIC PANEL
Anion gap: 12 (ref 5–15)
BUN: 10 mg/dL (ref 6–20)
CALCIUM: 8.5 mg/dL — AB (ref 8.9–10.3)
CHLORIDE: 101 mmol/L (ref 101–111)
CO2: 24 mmol/L (ref 22–32)
Creatinine, Ser: 0.89 mg/dL (ref 0.61–1.24)
GFR calc Af Amer: >60 mL/min (ref >60)
Glucose, Bld: 135 mg/dL — ABNORMAL HIGH (ref 65–99)
POTASSIUM: 3.3 mmol/L — AB (ref 3.5–5.1)
Sodium: 137 mmol/L (ref 135–145)

## 2014-08-23 LAB — URINE MICROSCOPIC-ADD ON

## 2014-08-23 LAB — GLUCOSE, CAPILLARY
Glucose-Capillary: 159 mg/dL — ABNORMAL HIGH (ref 65–99)
Glucose-Capillary: 220 mg/dL — ABNORMAL HIGH (ref 65–99)

## 2014-08-23 LAB — TSH: TSH: 1.848 u[IU]/mL (ref 0.350–4.500)

## 2014-08-23 LAB — TROPONIN I: Troponin I: <0.03 ng/mL (ref <0.031)

## 2014-08-23 LAB — CK: Total CK: 95 U/L (ref 49–397)

## 2014-08-23 MED ORDER — POLYETHYLENE GLYCOL 3350 17 G PO PACK
17.0000 g | PACK | Freq: Every day | ORAL | Status: DC
Start: 2014-08-23 — End: 2014-08-29
  Administered 2014-08-24 – 2014-08-28 (×3): 17 g via ORAL
  Filled 2014-08-23 (×7): qty 1

## 2014-08-23 MED ORDER — LORATADINE 10 MG PO TABS
10.0000 mg | ORAL_TABLET | Freq: Every day | ORAL | Status: DC
  Administered 2014-08-23 – 2014-08-29 (×7): 10 mg via ORAL
  Filled 2014-08-23 (×7): qty 1

## 2014-08-23 MED ORDER — FENTANYL CITRATE (PF) 100 MCG/2ML IJ SOLN
50.0000 ug | Freq: Once | INTRAMUSCULAR | Status: AC
  Administered 2014-08-23: 50 ug via INTRAVENOUS
  Filled 2014-08-23: qty 2

## 2014-08-23 MED ORDER — ACETAMINOPHEN 650 MG RE SUPP: 650.0000 mg | Freq: Four times a day (QID) | RECTAL | Status: DC | PRN

## 2014-08-23 MED ORDER — SODIUM CHLORIDE 0.9 % IJ SOLN
3.0000 mL | Freq: Two times a day (BID) | INTRAMUSCULAR | Status: DC
  Administered 2014-08-23 – 2014-08-28 (×9): 3 mL via INTRAVENOUS

## 2014-08-23 MED ORDER — METHYLPREDNISOLONE SODIUM SUCC 125 MG IJ SOLR
60.0000 mg | Freq: Two times a day (BID) | INTRAMUSCULAR | Status: DC
  Administered 2014-08-23 – 2014-08-24 (×3): 60 mg via INTRAVENOUS
  Filled 2014-08-23 (×4): qty 0.96

## 2014-08-23 MED ORDER — FENTANYL CITRATE (PF) 100 MCG/2ML IJ SOLN
50.0000 ug | Freq: Once | INTRAMUSCULAR | Status: AC
  Administered 2014-08-23: 50 ug via INTRAVENOUS

## 2014-08-23 MED ORDER — LEVALBUTEROL HCL 0.63 MG/3ML IN NEBU
0.6300 mg | INHALATION_SOLUTION | Freq: Four times a day (QID) | RESPIRATORY_TRACT | Status: DC | PRN
  Administered 2014-08-23 – 2014-08-25 (×3): 0.63 mg via RESPIRATORY_TRACT
  Filled 2014-08-23 (×2): qty 3

## 2014-08-23 MED ORDER — DILTIAZEM HCL ER COATED BEADS 180 MG PO CP24
180.0000 mg | ORAL_CAPSULE | Freq: Every day | ORAL | Status: DC
  Administered 2014-08-23 – 2014-08-29 (×7): 180 mg via ORAL
  Filled 2014-08-23 (×7): qty 1

## 2014-08-23 MED ORDER — BUDESONIDE 0.25 MG/2ML IN SUSP
0.2500 mg | Freq: Two times a day (BID) | RESPIRATORY_TRACT | Status: DC
  Administered 2014-08-23 – 2014-08-27 (×9): 0.25 mg via RESPIRATORY_TRACT
  Filled 2014-08-23 (×11): qty 2

## 2014-08-23 MED ORDER — VITAMIN B-12 1000 MCG PO TABS
2000.0000 ug | ORAL_TABLET | Freq: Every day | ORAL | Status: DC
  Administered 2014-08-23 – 2014-08-29 (×7): 2000 ug via ORAL
  Filled 2014-08-23 (×7): qty 2

## 2014-08-23 MED ORDER — INSULIN ASPART 100 UNIT/ML ~~LOC~~ SOLN
0.0000 [IU] | Freq: Three times a day (TID) | SUBCUTANEOUS | Status: DC
  Administered 2014-08-23: 2 [IU] via SUBCUTANEOUS
  Administered 2014-08-24 (×2): 5 [IU] via SUBCUTANEOUS
  Administered 2014-08-24: 2 [IU] via SUBCUTANEOUS
  Administered 2014-08-25: 5 [IU] via SUBCUTANEOUS
  Administered 2014-08-25: 3 [IU] via SUBCUTANEOUS

## 2014-08-23 MED ORDER — FENTANYL CITRATE (PF) 100 MCG/2ML IJ SOLN
INTRAMUSCULAR | Status: AC
  Filled 2014-08-23: qty 2

## 2014-08-23 MED ORDER — TIOTROPIUM BROMIDE MONOHYDRATE 18 MCG IN CAPS
18.0000 ug | ORAL_CAPSULE | Freq: Every day | RESPIRATORY_TRACT | Status: DC
  Filled 2014-08-23: qty 5

## 2014-08-23 MED ORDER — MORPHINE SULFATE 2 MG/ML IJ SOLN
2.0000 mg | INTRAMUSCULAR | Status: DC | PRN
  Administered 2014-08-23 – 2014-08-28 (×17): 2 mg via INTRAVENOUS
  Filled 2014-08-23 (×18): qty 1

## 2014-08-23 MED ORDER — ONDANSETRON HCL 4 MG/2ML IJ SOLN: 4.0000 mg | Freq: Four times a day (QID) | INTRAMUSCULAR | Status: DC | PRN

## 2014-08-23 MED ORDER — FUROSEMIDE 40 MG PO TABS
40.0000 mg | ORAL_TABLET | Freq: Every day | ORAL | Status: DC
  Administered 2014-08-23 – 2014-08-25 (×3): 40 mg via ORAL
  Filled 2014-08-23 (×3): qty 1

## 2014-08-23 MED ORDER — IPRATROPIUM BROMIDE 0.02 % IN SOLN
0.5000 mg | RESPIRATORY_TRACT | Status: DC
  Administered 2014-08-23: 0.5 mg via RESPIRATORY_TRACT
  Filled 2014-08-23: qty 2.5

## 2014-08-23 MED ORDER — AMIODARONE HCL 200 MG PO TABS
200.0000 mg | ORAL_TABLET | Freq: Every day | ORAL | Status: DC
  Administered 2014-08-23 – 2014-08-29 (×7): 200 mg via ORAL
  Filled 2014-08-23 (×7): qty 1

## 2014-08-23 MED ORDER — ACETAMINOPHEN 325 MG PO TABS
650.0000 mg | ORAL_TABLET | Freq: Four times a day (QID) | ORAL | Status: DC | PRN
  Administered 2014-08-26 – 2014-08-27 (×2): 650 mg via ORAL
  Filled 2014-08-23 (×2): qty 2

## 2014-08-23 MED ORDER — CALCIUM CARBONATE-VITAMIN D 500-200 MG-UNIT PO TABS
1.0000 | ORAL_TABLET | Freq: Every day | ORAL | Status: DC
  Administered 2014-08-23 – 2014-08-29 (×7): 1 via ORAL
  Filled 2014-08-23 (×9): qty 1

## 2014-08-23 MED ORDER — DOXYCYCLINE HYCLATE 100 MG IV SOLR
100.0000 mg | Freq: Two times a day (BID) | INTRAVENOUS | Status: DC
  Administered 2014-08-23 – 2014-08-27 (×10): 100 mg via INTRAVENOUS
  Filled 2014-08-23 (×12): qty 100

## 2014-08-23 MED ORDER — ROSUVASTATIN CALCIUM 10 MG PO TABS
10.0000 mg | ORAL_TABLET | Freq: Every day | ORAL | Status: DC
  Administered 2014-08-23 – 2014-08-29 (×7): 10 mg via ORAL
  Filled 2014-08-23 (×7): qty 1

## 2014-08-23 MED ORDER — IPRATROPIUM BROMIDE 0.02 % IN SOLN
0.5000 mg | Freq: Four times a day (QID) | RESPIRATORY_TRACT | Status: DC
  Administered 2014-08-23 – 2014-08-26 (×12): 0.5 mg via RESPIRATORY_TRACT
  Filled 2014-08-23 (×12): qty 2.5

## 2014-08-23 MED ORDER — FENTANYL CITRATE (PF) 100 MCG/2ML IJ SOLN
50.0000 ug | INTRAMUSCULAR | Status: DC | PRN
  Administered 2014-08-23 – 2014-08-27 (×4): 50 ug via INTRAVENOUS
  Filled 2014-08-23 (×4): qty 2

## 2014-08-23 MED ORDER — ASPIRIN EC 81 MG PO TBEC
81.0000 mg | DELAYED_RELEASE_TABLET | Freq: Every day | ORAL | Status: DC
  Administered 2014-08-23 – 2014-08-29 (×7): 81 mg via ORAL
  Filled 2014-08-23 (×7): qty 1

## 2014-08-23 MED ORDER — POTASSIUM CHLORIDE CRYS ER 20 MEQ PO TBCR
20.0000 meq | EXTENDED_RELEASE_TABLET | Freq: Every day | ORAL | Status: DC
  Administered 2014-08-23 – 2014-08-29 (×7): 20 meq via ORAL
  Filled 2014-08-23 (×10): qty 1

## 2014-08-23 MED ORDER — SODIUM CHLORIDE 0.9 % IV BOLUS (SEPSIS)
1000.0000 mL | Freq: Once | INTRAVENOUS | Status: AC
  Administered 2014-08-23: 1000 mL via INTRAVENOUS

## 2014-08-23 MED ORDER — LEVALBUTEROL HCL 0.63 MG/3ML IN NEBU
0.6300 mg | INHALATION_SOLUTION | Freq: Four times a day (QID) | RESPIRATORY_TRACT | Status: DC
  Administered 2014-08-23 – 2014-08-26 (×13): 0.63 mg via RESPIRATORY_TRACT
  Filled 2014-08-23 (×22): qty 3

## 2014-08-23 MED ORDER — PANTOPRAZOLE SODIUM 40 MG PO TBEC
40.0000 mg | DELAYED_RELEASE_TABLET | Freq: Two times a day (BID) | ORAL | Status: DC
  Administered 2014-08-23 – 2014-08-29 (×13): 40 mg via ORAL
  Filled 2014-08-23 (×12): qty 1

## 2014-08-23 MED ORDER — ONDANSETRON HCL 4 MG PO TABS: 4.0000 mg | ORAL_TABLET | Freq: Four times a day (QID) | ORAL | Status: DC | PRN

## 2014-08-23 NOTE — Progress Notes (Signed)
PT Cancellation Note  Patient Details Name: [name] MRN: [id number] DOB: [birth year]   Cancelled Treatment:    Reason Eval/Treat Not Completed: Pain limiting ability to participate.  Will check back today as able or will try to see as able 5/29. 08/23/2014  Donnella Sham, [place] [phone]  (pager)   Fransico Sciandra, Tessie Fass 08/23/2014, 11:46 AM

## 2014-08-23 NOTE — ED Provider Notes (Signed)
CSN: [id number]     Arrival date & time 08/23/14  0129 History   First MD Initiated Contact with Patient 08/23/14 [phone]     Chief Complaint  Patient presents with  . Loss of Consciousness     (Consider location/radiation/quality/duration/timing/severity/associated sxs/prior Treatment) HPI Comments: Pt comes in with cc of syncope. Pt has hx of COPD, CAD, CHF, Afib, not on anticoagulants currently who is coming in with syncope. Pt was ambulating, suddenly he felt like sneezing, and the next thing he recalls is being on the floor. He had no cardiac prodrome, or any prodrome. He had no incontinence, no hx of seizures. From the resultant fall, pt has back pain, elbow pain. He was unable to get up. There is no hx of PE, DVT. Pt did have a recent elbow procedure. Pt was taken off of xarelto for the procedure and is not taking it currently. With the back pain, there is no associated numbness, weakness, urinary incontinence, urinary retention, bowel incontinence, weakness.   ROS 10 Systems reviewed and are negative for acute change except as noted in the HPI.     Patient is a 74 y.o. male presenting with syncope. The history is provided by the patient.  Loss of Consciousness   Past Medical History  Diagnosis Date  . HYPERLIPIDEMIA 11/21/2006  . HYPERTENSION 11/21/2006  . MYOCARDIAL INFARCTION, HX OF 11/21/2006  . CORONARY ARTERY DISEASE 11/21/2006  . OSTEOPOROSIS 11/21/2006  . COLONIC POLYPS 05/21/2007  . COPD 05/21/2007  . HIATAL HERNIA 05/21/2007  . DIVERTICULAR DISEASE 05/21/2007  . NEPHROLITHIASIS 05/21/2007  . GI BLEEDING 09/09/2008  . ANEMIA, B12 DEFICIENCY 12/02/2008  . CAROTID ARTERY STENOSIS 04/16/2009  . PERIPHERAL VASCULAR DISEASE 09/02/2009  . Atrial fibrillation 11/06/2009  . Allergic rhinitis   . Osteoporosis   . Hemoptysis   . Internal hemorrhoids   . GERD (gastroesophageal reflux disease)   . Cataract   . Dysrhythmia     Afib  . Shortness of breath dyspnea   . Pneumonia   .  Arthritis   . History of kidney stones   . Diabetes mellitus without complication     pt. states he is not diabetic   Past Surgical History  Procedure Laterality Date  . Thyroidectomy      partial  . Coronary stent placement      03/07/2001  . Cholecystectomy    . Appendectomy    . Hemorrhoid surgery    . Inguinal hernia repair  10/10/08  . Coronary artery bypass graft  2011  . Ptca    . Cardioversion N/A 04/16/2014    Procedure: CARDIOVERSION;  Surgeon: Josue Hector, MD;  Location: [facility];  Service: Cardiovascular;  Laterality: N/A;  . Olecranon bursectomy  07/09/2014  . Mass excision Left 07/09/2014    Procedure: LEFT ELBOW BURSECTOMY EXCISION MASS;  Surgeon: Iran Planas, MD;  Location: [facility];  Service: Orthopedics;  Laterality: Left;   Family History  Problem Relation Age of Onset  . Arthritis Mother     rheumatoid  . Heart attack Father   . Heart disease Father   . Heart disease Brother   . Colon cancer Brother 84  . Stomach cancer Neg Hx   . Heart disease Brother    History  Substance Use Topics  . Smoking status: Former Smoker -- 3.00 packs/day for 50 years    Types: Cigarettes    Quit date: 09/25/2009  . Smokeless tobacco: Never Used  . Alcohol Use: 3.0 oz/week  6 Standard drinks or equivalent per week     Comment: [place] 2-3 a night    Review of Systems  Respiratory: Positive for wheezing.   Cardiovascular: Positive for syncope.  Musculoskeletal: Positive for back pain.  Neurological: Positive for syncope.  All other systems reviewed and are negative.     Allergies  Xarelto; Ticlopidine hcl; and Lorazepam  Home Medications   Prior to Admission medications   Medication Sig Start Date End Date Taking? Authorizing Provider  amiodarone (PACERONE) 200 MG tablet TAKE 1 TABLET (200 MG TOTAL) BY MOUTH DAILY. 07/25/14  Yes Marin Olp, MD  aspirin EC 81 MG tablet Take 81 mg by mouth daily.   Yes Historical Provider, MD  Calcium Carbonate-Vit  D-Min (CALTRATE 600+D PLUS) 600-400 MG-UNIT per tablet Chew 1 tablet by mouth daily.     Yes Historical Provider, MD  cetirizine (ZYRTEC ALLERGY) 10 MG tablet Take 1 tablet (10 mg total) by mouth every morning. 08/17/10  Yes Tammy S Parrett, NP  cyanocobalamin ([employer] VITAMIN B12) 2000 MCG tablet Take 1 tablet (2,000 mcg total) by mouth daily. 04/02/12  Yes Lisabeth Pick, MD  diltiazem (CARDIZEM CD) 180 MG 24 hr capsule Take 1 capsule (180 mg total) by mouth daily. 03/17/14  Yes Burtis Junes, NP  DULERA 100-5 MCG/ACT AERO INHALE 2 PUFFS INTO THE LUNGS TWICE A DAY Patient taking differently: INHALE 2 PUFFS INTO THE LUNGS TWICE A DAY AS NEEDED FOR SHORTNESS OF BREATH 11/28/12  Yes Lisabeth Pick, MD  furosemide (LASIX) 40 MG tablet Take 1 tablet (40 mg total) by mouth daily. 03/17/14  Yes Burtis Junes, NP  pantoprazole (PROTONIX) 40 MG tablet TAKE 1 TABLET BY MOUTH TWICE A DAY 05/19/14  Yes Marin Olp, MD  potassium chloride SA (K-DUR,KLOR-CON) 20 MEQ tablet Take 1 tablet (20 mEq total) by mouth daily. 03/17/14  Yes Burtis Junes, NP  rosuvastatin (CRESTOR) 20 MG tablet Take 10 mg by mouth daily.    Yes Historical Provider, MD  SPIRIVA HANDIHALER 18 MCG inhalation capsule INHALE CONTENTS OF 1 CAPSULE DAILY 01/24/14  Yes Bruce H Swords, MD  triamcinolone (KENALOG) 0.025 % ointment APPLY 1 A SMALL AMOUNT TO AFFECTED AREA TWICE A DAY Patient taking differently: APPLY 1 A SMALL AMOUNT TO AFFECTED AREA TWICE A DAY AS NEEDED FOR SHORTNESS OF BREATH 09/10/10  Yes Lisabeth Pick, MD  XOPENEX HFA 45 MCG/ACT inhaler INHALE 2 PUFFS INTO THE LUNGS EVERY 4 (FOUR) HOURS AS NEEDED FOR WHEEZING. 05/08/14  Yes Marin Olp, MD  dabigatran (PRADAXA) 150 MG CAPS capsule Take 1 capsule (150 mg total) by mouth 2 (two) times daily. Patient not taking: Reported on 08/23/2014 08/20/14   Liliane Shi, PA-C   BP 128/76 mmHg  Pulse 77  Temp(Src) 97.7 F (36.5 C) (Oral)  Resp 24  Ht 5\' 3"  (1.6 m)  Wt 179 lb 6.4 oz  (81.375 kg)  BMI 31.79 kg/m2  SpO2 95% Physical Exam  Constitutional: He is oriented to person, place, and time. He appears well-developed.  HENT:  Head: Atraumatic.  Neck: Neck supple.  No midline c-spine tenderness, pt able to turn head to 45 degrees bilaterally without any pain and able to flex neck to the chest and extend without any pain or neurologic symptoms.   Cardiovascular: Normal rate.   Murmur heard. Pulmonary/Chest: Effort normal.  Musculoskeletal:  Head to toe evaluation shows no hematoma, bleeding of the scalp, no facial abrasions, step offs, crepitus, no  tenderness to palpation of the bilateral upper and lower extremities, no gross deformities, no chest tenderness, no pelvic pain.  PT HAS MIDLINE THORACIC AND LUMBAR SPINE TENDERNESS.  Neurological: He is alert and oriented to person, place, and time.  NORMAL SENSORY EXAM - LOWER EXT  Skin: Skin is warm.  Nursing note and vitals reviewed.   ED Course  Procedures (including critical care time) Labs Review Labs Reviewed  CBC WITH DIFFERENTIAL/PLATELET - Abnormal; Notable for the following:    WBC 11.0 (*)    RBC 4.19 (*)    Neutro Abs 7.8 (*)    All other components within normal limits  BASIC METABOLIC PANEL - Abnormal; Notable for the following:    Potassium 3.3 (*)    Glucose, Bld 135 (*)    Calcium 8.5 (*)    All other components within normal limits  COMPREHENSIVE METABOLIC PANEL - Abnormal; Notable for the following:    Chloride 100 (*)    Glucose, Bld 136 (*)    Calcium 8.1 (*)    Total Protein 6.0 (*)    Albumin 3.3 (*)    ALT 15 (*)    All other components within normal limits  CBC WITH DIFFERENTIAL/PLATELET - Abnormal; Notable for the following:    RBC 3.95 (*)    Hemoglobin 12.5 (*)    Neutrophils Relative % 81 (*)    Lymphocytes Relative 9 (*)    All other components within normal limits  URINALYSIS, ROUTINE W REFLEX MICROSCOPIC (NOT AT [facility]) - Abnormal; Notable for the following:    Color,  Urine AMBER (*)    Hgb urine dipstick SMALL (*)    All other components within normal limits  GLUCOSE, CAPILLARY - Abnormal; Notable for the following:    Glucose-Capillary 159 (*)    All other components within normal limits  GLUCOSE, CAPILLARY - Abnormal; Notable for the following:    Glucose-Capillary 220 (*)    All other components within normal limits  TROPONIN I  TSH  TROPONIN I  CK  URINE MICROSCOPIC-ADD ON  HEMOGLOBIN R7E  BASIC METABOLIC PANEL  CBC    Imaging Review Dg Thoracic Spine W/swimmers  08/23/2014   CLINICAL DATA:  Fall tonight with upper and lower back pain.  EXAM: THORACIC SPINE - 2 VIEW + SWIMMERS  COMPARISON:  Chest CT 02/17/2010  FINDINGS: There is a compression fracture of T7 with advanced (greater than 75%) height loss, chronic. Superior endplate compression of T3 is also chronic based on previous CT. No new compression fracture noted. There is exaggerated thoracic kyphosis without subluxation. No focal bone lesion or endplate erosion.  CABG and left thyroidectomy.  No acute soft tissue findings.  Small hiatal hernia.  IMPRESSION: 1. No acute findings. 2. Remote compression fractures of T3 and T7, with height loss advanced at T7.   Electronically Signed   By: Monte Fantasia M.D.   On: 08/23/2014 02:57   Dg Lumbar Spine Complete  08/23/2014   CLINICAL DATA:  Status post fall, with lower back pain. Initial encounter.  EXAM: LUMBAR SPINE - COMPLETE 4+ VIEW  COMPARISON:  Lumbar spine radiographs performed 04/15/2008  FINDINGS: There is compression deformity involving vertebral body L1, with approximately 25% loss of height. This is of indeterminate age, but may be chronic in nature, though new from 2010.  Vertebral bodies otherwise demonstrate normal height. There is stable grade 2 anterolisthesis of L5 on S1, with underlying facet disease.  The visualized bowel gas pattern is unremarkable in appearance;  air and stool are noted within the colon. The sacroiliac joints are  within normal limits. Clips are noted at the right upper quadrant. Diffuse vascular calcifications are seen, with a stent graft along the left common iliac artery.  IMPRESSION: 1. Compression deformity of vertebral body L1, with approximately 25% loss of height. This is new from 2010 and of indeterminate age, though it has a somewhat more chronic appearance. 2. Stable grade 2 anterolisthesis of L5 on S1, with associated degenerative change. 3. Diffuse vascular calcifications seen.   Electronically Signed   By: Garald Balding M.D.   On: 08/23/2014 02:57   Dg Elbow Complete Left  08/23/2014   CLINICAL DATA:  Loss of consciousness with fall. Left elbow pain. Initial encounter.  EXAM: LEFT ELBOW - COMPLETE 3+ VIEW  COMPARISON:  None.  FINDINGS: There is no evidence of fracture, dislocation, or joint effusion. Enthesopathic ossification about the medial epicondyle.  IMPRESSION: Negative.   Electronically Signed   By: Monte Fantasia M.D.   On: 08/23/2014 02:59   Ct Head Wo Contrast  08/23/2014   CLINICAL DATA:  Fall.  Left head soreness.  Initial encounter.  EXAM: CT HEAD WITHOUT CONTRAST  TECHNIQUE: Contiguous axial images were obtained from the base of the skull through the vertex without intravenous contrast.  COMPARISON:  None.  FINDINGS: Skull and Sinuses:Negative for fracture or destructive process. The mastoids, middle ears, and imaged paranasal sinuses are clear.  Orbits: Right cataract resection.  No traumatic findings.  Brain: No evidence of acute infarction, hemorrhage, hydrocephalus, or mass lesion/mass effect. Generalized atrophy which is mild for age. Intracranial atherosclerosis.  IMPRESSION: No evidence of intracranial injury or fracture.   Electronically Signed   By: Monte Fantasia M.D.   On: 08/23/2014 03:10   Dg Hips Bilat With Pelvis 3-4 Views  08/23/2014   CLINICAL DATA:  Status post fall, with bilateral hip pain. Initial encounter.  EXAM: BILATERAL HIP (WITH PELVIS) 3-4 VIEWS  COMPARISON:   None.  FINDINGS: There is no evidence of fracture or dislocation. Both femoral heads are seated normally within their respective acetabula. The proximal femurs appear intact. No significant degenerative change is appreciated. The sacroiliac joints are unremarkable in appearance.  The visualized bowel gas pattern is grossly unremarkable in appearance. Scattered phleboliths are noted within the pelvis. Diffuse vascular calcifications are seen. A stent is noted along the left common iliac artery.  IMPRESSION: No evidence of fracture or dislocation.   Electronically Signed   By: Garald Balding M.D.   On: 08/23/2014 02:58     EKG Interpretation None      MDM   Final diagnoses:  Fall  Syncope and collapse    Pt comes in with cc of syncope and back pain. Pt has T spine tenderness, L spine tenderness and L elbow tenderness - Xrays ordered. No concerns for cord compression. Pt has a-flutter, 3:1 - and has CHF and no prodrome - high risk syncope patient - so we will admit for obs.     Varney Biles, MD 08/23/14 [phone]

## 2014-08-23 NOTE — H&P (Addendum)
[facility] History and Physical  KAILO KOSIK QMV:[id number] DOB: 11/18/1940 DOA: 08/23/2014  Referring physician: Dr.Patel. PCP: Garret Reddish, MD  Specialists: Dr.Cooper.  Chief Complaint: Loss of consciousness.  HPI: [name] is a 74 y.o. male history of CAD status post CABG, chronic atrial fibrillation, COPD, diastolic CHF, hypertension presents to the ER after patient had a brief episode of loss of consciousness last night. Patient states he was walking from the den  to his room and he sneezed and next thing he remembers was he was lying on the floor. He thinks he may have lost consciousness for around less than 5 minutes. Denies any incontinence of urine bowels or any tongue bites. Denies any chest pain and patient has chronic shortness of breath from COPD. He was brought to the ER and CT head and x-rays were done. CT head was negative for anything acute. X-rays show possible lumbar fracture age indeterminant. Patient is still having severe pain on moving his low back. Patient otherwise denies any chest pain. Denies any palpitations. Patient has had recent left elbow hematoma and has been off his Pradaxa. Patient was instructed to restart his Pradaxa. by his cardiologist last week but patient has not yet started.  Review of Systems: As presented in the history of presenting illness, rest negative.  Past Medical History  Diagnosis Date  . HYPERLIPIDEMIA 11/21/2006  . HYPERTENSION 11/21/2006  . MYOCARDIAL INFARCTION, HX OF 11/21/2006  . CORONARY ARTERY DISEASE 11/21/2006  . OSTEOPOROSIS 11/21/2006  . COLONIC POLYPS 05/21/2007  . COPD 05/21/2007  . HIATAL HERNIA 05/21/2007  . DIVERTICULAR DISEASE 05/21/2007  . NEPHROLITHIASIS 05/21/2007  . GI BLEEDING 09/09/2008  . ANEMIA, B12 DEFICIENCY 12/02/2008  . CAROTID ARTERY STENOSIS 04/16/2009  . PERIPHERAL VASCULAR DISEASE 09/02/2009  . Atrial fibrillation 11/06/2009  . Allergic rhinitis   . Osteoporosis   . Hemoptysis   . Internal  hemorrhoids   . GERD (gastroesophageal reflux disease)   . Cataract   . Dysrhythmia     Afib  . Shortness of breath dyspnea   . Pneumonia   . Arthritis   . History of kidney stones   . Diabetes mellitus without complication     pt. states he is not diabetic   Past Surgical History  Procedure Laterality Date  . Thyroidectomy      partial  . Coronary stent placement      03/07/2001  . Cholecystectomy    . Appendectomy    . Hemorrhoid surgery    . Inguinal hernia repair  10/10/08  . Coronary artery bypass graft  2011  . Ptca    . Cardioversion N/A 04/16/2014    Procedure: CARDIOVERSION;  Surgeon: Josue Hector, MD;  Location: [place];  Service: Cardiovascular;  Laterality: N/A;  . Olecranon bursectomy  07/09/2014  . Mass excision Left 07/09/2014    Procedure: LEFT ELBOW BURSECTOMY EXCISION MASS;  Surgeon: Iran Planas, MD;  Location: [place];  Service: Orthopedics;  Laterality: Left;   Social History:  reports that he quit smoking about 4 years ago. His smoking use included Cigarettes. He has a 150 pack-year smoking history. He has never used smokeless tobacco. He reports that he drinks about 3.0 oz of alcohol per week. He reports that he does not use illicit drugs. Where does patient live  home. Can patient participate in ADLs?  Yes.  Allergies  Allergen Reactions  . Xarelto [Rivaroxaban] Other (See Comments)    makes pt crazy  . Ticlopidine Hcl Swelling  .  Lorazepam     Altered mental status from 2 doses of IV Ativan on 05/10/14.    Family History:  Family History  Problem Relation Age of Onset  . Arthritis Mother     rheumatoid  . Heart attack Father   . Heart disease Father   . Heart disease Brother   . Colon cancer Brother 34  . Stomach cancer Neg Hx   . Heart disease Brother       Prior to Admission medications   Medication Sig Start Date End Date Taking? Authorizing Provider  amiodarone (PACERONE) 200 MG tablet TAKE 1 TABLET (200 MG TOTAL) BY MOUTH DAILY.  07/25/14  Yes Marin Olp, MD  aspirin EC 81 MG tablet Take 81 mg by mouth daily.   Yes Historical Provider, MD  Calcium Carbonate-Vit D-Min (CALTRATE 600+D PLUS) 600-400 MG-UNIT per tablet Chew 1 tablet by mouth daily.     Yes Historical Provider, MD  cetirizine (ZYRTEC ALLERGY) 10 MG tablet Take 1 tablet (10 mg total) by mouth Newman morning. 08/17/10  Yes Tammy S Parrett, NP  cyanocobalamin ([employer] VITAMIN B12) 2000 MCG tablet Take 1 tablet (2,000 mcg total) by mouth daily. 04/02/12  Yes Lisabeth Pick, MD  diltiazem (CARDIZEM CD) 180 MG 24 hr capsule Take 1 capsule (180 mg total) by mouth daily. 03/17/14  Yes Burtis Junes, NP  DULERA 100-5 MCG/ACT AERO INHALE 2 PUFFS INTO THE LUNGS TWICE A DAY Patient taking differently: INHALE 2 PUFFS INTO THE LUNGS TWICE A DAY AS NEEDED FOR SHORTNESS OF BREATH 11/28/12  Yes Lisabeth Pick, MD  furosemide (LASIX) 40 MG tablet Take 1 tablet (40 mg total) by mouth daily. 03/17/14  Yes Burtis Junes, NP  pantoprazole (PROTONIX) 40 MG tablet TAKE 1 TABLET BY MOUTH TWICE A DAY 05/19/14  Yes Marin Olp, MD  potassium chloride SA (K-DUR,KLOR-CON) 20 MEQ tablet Take 1 tablet (20 mEq total) by mouth daily. 03/17/14  Yes Burtis Junes, NP  rosuvastatin (CRESTOR) 20 MG tablet Take 10 mg by mouth daily.    Yes Historical Provider, MD  SPIRIVA HANDIHALER 18 MCG inhalation capsule INHALE CONTENTS OF 1 CAPSULE DAILY 01/24/14  Yes Bruce H Swords, MD  triamcinolone (KENALOG) 0.025 % ointment APPLY 1 A SMALL AMOUNT TO AFFECTED AREA TWICE A DAY Patient taking differently: APPLY 1 A SMALL AMOUNT TO AFFECTED AREA TWICE A DAY AS NEEDED FOR SHORTNESS OF BREATH 09/10/10  Yes Lisabeth Pick, MD  XOPENEX HFA 45 MCG/ACT inhaler INHALE 2 PUFFS INTO THE LUNGS Newman 4 (FOUR) HOURS AS NEEDED FOR WHEEZING. 05/08/14  Yes Marin Olp, MD  dabigatran (PRADAXA) 150 MG CAPS capsule Take 1 capsule (150 mg total) by mouth 2 (two) times daily. Patient not taking: Reported on 08/23/2014  08/20/14   Liliane Shi, PA-C    Physical Exam: Filed Vitals:   08/23/14 0315 08/23/14 0330 08/23/14 0345 08/23/14 0514  BP: 123/67 125/71 123/73 136/63  Pulse: 73 75 75 77  Temp:    97.9 F (36.6 C)  TempSrc:    Oral  Resp: 22 26 19 24   Height:    5\' 3"  (1.6 m)  Weight:    81.375 kg (179 lb 6.4 oz)  SpO2: 95% 96% 97% 95%     General:  Well-developed and nourished.  Eyes:  Anicteric no pallor.  ENT:  No discharge from the ears eyes nose and mouth.  Neck:  No mass felt.  Cardiovascular:  S1-S2 heard.  Respiratory:  Bilateral expiratory wheezing.  Abdomen:  Soft nontender bowel sounds present.  Skin:  No rash.  Musculoskeletal:  Pain on moving his back. Back pain increases on raising his left leg.  Psychiatric:  Appears normal.  Neurologic:  Alert and oriented to time place and person. Moves all extremities.  Labs on Admission:  Basic Metabolic Panel:  Recent Labs Lab 08/23/14 0205  NA 137  K 3.3*  CL 101  CO2 24  GLUCOSE 135*  BUN 10  CREATININE 0.89  CALCIUM 8.5*   Liver Function Tests: No results for input(s): AST, ALT, ALKPHOS, BILITOT, PROT, ALBUMIN in the last 168 hours. No results for input(s): LIPASE, AMYLASE in the last 168 hours. No results for input(s): AMMONIA in the last 168 hours. CBC:  Recent Labs Lab 08/23/14 0205  WBC 11.0*  NEUTROABS 7.8*  HGB 13.5  HCT 41.6  MCV 99.3  PLT 191   Cardiac Enzymes:  Recent Labs Lab 08/23/14 0205  TROPONINI <0.03    BNP (last 3 results)  Recent Labs  05/10/14 0719 05/13/14 0317  BNP 190.0* 309.4*    ProBNP (last 3 results)  Recent Labs  03/17/14 1032  PROBNP 142.0*    CBG: No results for input(s): GLUCAP in the last 168 hours.  Radiological Exams on Admission: Dg Thoracic Spine W/swimmers  08/23/2014   CLINICAL DATA:  Fall tonight with upper and lower back pain.  EXAM: THORACIC SPINE - 2 VIEW + SWIMMERS  COMPARISON:  Chest CT 02/17/2010  FINDINGS: There is a compression  fracture of T7 with advanced (greater than 75%) height loss, chronic. Superior endplate compression of T3 is also chronic based on previous CT. No new compression fracture noted. There is exaggerated thoracic kyphosis without subluxation. No focal bone lesion or endplate erosion.  CABG and left thyroidectomy.  No acute soft tissue findings.  Small hiatal hernia.  IMPRESSION: 1. No acute findings. 2. Remote compression fractures of T3 and T7, with height loss advanced at T7.   Electronically Signed   By: Monte Fantasia M.D.   On: 08/23/2014 02:57   Dg Lumbar Spine Complete  08/23/2014   CLINICAL DATA:  Status post fall, with lower back pain. Initial encounter.  EXAM: LUMBAR SPINE - COMPLETE 4+ VIEW  COMPARISON:  Lumbar spine radiographs performed 04/15/2008  FINDINGS: There is compression deformity involving vertebral body L1, with approximately 25% loss of height. This is of indeterminate age, but may be chronic in nature, though new from 2010.  Vertebral bodies otherwise demonstrate normal height. There is stable grade 2 anterolisthesis of L5 on S1, with underlying facet disease.  The visualized bowel gas pattern is unremarkable in appearance; air and stool are noted within the colon. The sacroiliac joints are within normal limits. Clips are noted at the right upper quadrant. Diffuse vascular calcifications are seen, with a stent graft along the left common iliac artery.  IMPRESSION: 1. Compression deformity of vertebral body L1, with approximately 25% loss of height. This is new from 2010 and of indeterminate age, though it has a somewhat more chronic appearance. 2. Stable grade 2 anterolisthesis of L5 on S1, with associated degenerative change. 3. Diffuse vascular calcifications seen.   Electronically Signed   By: Garald Balding M.D.   On: 08/23/2014 02:57   Dg Elbow Complete Left  08/23/2014   CLINICAL DATA:  Loss of consciousness with fall. Left elbow pain. Initial encounter.  EXAM: LEFT ELBOW - COMPLETE  3+ VIEW  COMPARISON:  None.  FINDINGS: There is  no evidence of fracture, dislocation, or joint effusion. Enthesopathic ossification about the medial epicondyle.  IMPRESSION: Negative.   Electronically Signed   By: Monte Fantasia M.D.   On: 08/23/2014 02:59   Ct Head Wo Contrast  08/23/2014   CLINICAL DATA:  Fall.  Left head soreness.  Initial encounter.  EXAM: CT HEAD WITHOUT CONTRAST  TECHNIQUE: Contiguous axial images were obtained from the base of the skull through the vertex without intravenous contrast.  COMPARISON:  None.  FINDINGS: Skull and Sinuses:Negative for fracture or destructive process. The mastoids, middle ears, and imaged paranasal sinuses are clear.  Orbits: Right cataract resection.  No traumatic findings.  Brain: No evidence of acute infarction, hemorrhage, hydrocephalus, or mass lesion/mass effect. Generalized atrophy which is mild for age. Intracranial atherosclerosis.  IMPRESSION: No evidence of intracranial injury or fracture.   Electronically Signed   By: Monte Fantasia M.D.   On: 08/23/2014 03:10   Dg Hips Bilat With Pelvis 3-4 Views  08/23/2014   CLINICAL DATA:  Status post fall, with bilateral hip pain. Initial encounter.  EXAM: BILATERAL HIP (WITH PELVIS) 3-4 VIEWS  COMPARISON:  None.  FINDINGS: There is no evidence of fracture or dislocation. Both femoral heads are seated normally within their respective acetabula. The proximal femurs appear intact. No significant degenerative change is appreciated. The sacroiliac joints are unremarkable in appearance.  The visualized bowel gas pattern is grossly unremarkable in appearance. Scattered phleboliths are noted within the pelvis. Diffuse vascular calcifications are seen. A stent is noted along the left common iliac artery.  IMPRESSION: No evidence of fracture or dislocation.   Electronically Signed   By: Garald Balding M.D.   On: 08/23/2014 02:58    EKG: Independently reviewed.  Atrial flutter rate  controlled.  Assessment/Plan Active Problems:   Syncope   Low back pain   Hypertension   COPD (chronic obstructive pulmonary disease)   Chronic atrial fibrillation   Syncope and collapse  1.  syncope - given that patient had syncopal episode after sneezing could be vasovagal. But given history of atrial fibrillation at this time will closely monitor in telemetry for any arrhythmias. 2. Low back pain with x-ray showing possible lumbar fracture - I have ordered MRI lumbar spine and place patient on fentanyl when necessary for pain relief and get physical therapy consult. 3. Chronic atrial fibrillation presently rate controlled - patient is supposed to be on anticoagulation as per the cardiology notes during his last week's visit. But patient has still not started his anticoagulation which he has held from recent left elbow hematoma. At this time patient does have significant low back pain for which I have ordered MRI L-spine and onto then we will hold off starting anticoagulation. Continue amiodarone and rate limiting medications. 4. COPD - patient is wheezing bilaterally. I have placed patient on nebulizer and Pulmicort. Grossly observed. 5. CAD status post CABG - denies any chest pain. 6. History of diastolic CHF last EF measured was in November 2015 was 55-60% - continue Lasix. 7. Hypertension - continue home medications. 8. There is mention of diabetes mellitus in patient's chart but patient is not on any anti-diabetic medications. Check hemoglobin A1c.   DVT Prophylaxis SCDs for now may change to Lovenox or anticoagulation for A. fib if there is no acute fractures in the lumbar spine.  Code Status: Full code.  Family Communication: Discussed with patient.  Disposition Plan: Admit to inpatient.    Valrie Jia N. [facility] Pager [phone].  If 7PM-7AM,  please contact night-coverage [email or web address] Password [email or web address] 08/23/2014, 7:03 AM

## 2014-08-23 NOTE — Progress Notes (Addendum)
[facility] PROGRESS NOTE Assessment/Plan: Syncope and collapse - Likely vasovagal, no events telemetry. - Troponins are negative, echocardiogram done on November 2015 showed an ejection fraction of 55%.  Low back pain: - X-ray of the back showed a L1 compression fracture, stable grade 2 anterolisthesis of L5 and S1 associated with degenerative disease. On physical examination he has straight leg sign positive. - Agree with MRI. - cont narcotics for pain.  Essential  Hypertension - Continue home medications.  Acute COPD (chronic obstructive pulmonary disease) exacerbation: - start IV steroids  Inhalers and antibiotics. - pt with poor air movement  And wheezing B/L  Chronic atrial fibrillation - Currently rate control with no events on telemetry. - He has not started his anticoagulation as it was held for recent left elbow hematoma. - Continue to hold a correlation.   Code Status: full Family Communication: none  Disposition Plan: home in 1-2 days   Consultants:  none  Procedures:  MRI spine  Lumbar spine x-ray  Antibiotics:  None  HPI/Subjective: complaing of back pain  Objective: Filed Vitals:   08/23/14 0330 08/23/14 0345 08/23/14 0514 08/23/14 0741  BP: 125/71 123/73 136/63   Pulse: 75 75 77   Temp:   97.9 F (36.6 C)   TempSrc:   Oral   Resp: 26 19 24    Height:   5\' 3"  (1.6 m)   Weight:   81.375 kg (179 lb 6.4 oz)   SpO2: 96% 97% 95% 95%    Intake/Output Summary (Last 24 hours) at 08/23/14 0956 Last data filed at 08/23/14 0541  Gross per 24 hour  Intake    240 ml  Output      0 ml  Net    240 ml   Filed Weights   08/23/14 0514  Weight: 81.375 kg (179 lb 6.4 oz)    Exam:  General: Alert, awake, oriented x3, in no acute distress.  HEENT: No bruits, no goiter.  Heart: Regular rate and rhythm. Lungs: Good air movement, wheezing B/L Abdomen: Soft, nontender, nondistended, positive bowel sounds.  Neuro: Grossly intact,  nonfocal.   Data Reviewed: Basic Metabolic Panel:  Recent Labs Lab 08/23/14 0205 08/23/14 0810  NA 137 135  K 3.3* 3.9  CL 101 100*  CO2 24 25  GLUCOSE 135* 136*  BUN 10 10  CREATININE 0.89 0.85  CALCIUM 8.5* 8.1*   Liver Function Tests:  Recent Labs Lab 08/23/14 0810  AST 22  ALT 15*  ALKPHOS 48  BILITOT 0.8  PROT 6.0*  ALBUMIN 3.3*   No results for input(s): LIPASE, AMYLASE in the last 168 hours. No results for input(s): AMMONIA in the last 168 hours. CBC:  Recent Labs Lab 08/23/14 0205 08/23/14 0810  WBC 11.0* 9.3  NEUTROABS 7.8* 7.5  HGB 13.5 12.5*  HCT 41.6 39.3  MCV 99.3 99.5  PLT 191 189   Cardiac Enzymes:  Recent Labs Lab 08/23/14 0205 08/23/14 0810  CKTOTAL  --  95  TROPONINI <0.03 <0.03   BNP (last 3 results)  Recent Labs  05/10/14 0719 05/13/14 0317  BNP 190.0* 309.4*    ProBNP (last 3 results)  Recent Labs  03/17/14 1032  PROBNP 142.0*    CBG: No results for input(s): GLUCAP in the last 168 hours.  No results found for this or any previous visit (from the past 240 hour(s)).   Studies: Dg Thoracic Spine W/swimmers  08/23/2014   CLINICAL DATA:  Fall tonight with upper and lower back pain.  EXAM: THORACIC SPINE - 2 VIEW + SWIMMERS  COMPARISON:  Chest CT 02/17/2010  FINDINGS: There is a compression fracture of T7 with advanced (greater than 75%) height loss, chronic. Superior endplate compression of T3 is also chronic based on previous CT. No new compression fracture noted. There is exaggerated thoracic kyphosis without subluxation. No focal bone lesion or endplate erosion.  CABG and left thyroidectomy.  No acute soft tissue findings.  Small hiatal hernia.  IMPRESSION: 1. No acute findings. 2. Remote compression fractures of T3 and T7, with height loss advanced at T7.   Electronically Signed   By: Monte Fantasia M.D.   On: 08/23/2014 02:57   Dg Lumbar Spine Complete  08/23/2014   CLINICAL DATA:  Status post fall, with lower  back pain. Initial encounter.  EXAM: LUMBAR SPINE - COMPLETE 4+ VIEW  COMPARISON:  Lumbar spine radiographs performed 04/15/2008  FINDINGS: There is compression deformity involving vertebral body L1, with approximately 25% loss of height. This is of indeterminate age, but may be chronic in nature, though new from 2010.  Vertebral bodies otherwise demonstrate normal height. There is stable grade 2 anterolisthesis of L5 on S1, with underlying facet disease.  The visualized bowel gas pattern is unremarkable in appearance; air and stool are noted within the colon. The sacroiliac joints are within normal limits. Clips are noted at the right upper quadrant. Diffuse vascular calcifications are seen, with a stent graft along the left common iliac artery.  IMPRESSION: 1. Compression deformity of vertebral body L1, with approximately 25% loss of height. This is new from 2010 and of indeterminate age, though it has a somewhat more chronic appearance. 2. Stable grade 2 anterolisthesis of L5 on S1, with associated degenerative change. 3. Diffuse vascular calcifications seen.   Electronically Signed   By: Garald Balding M.D.   On: 08/23/2014 02:57   Dg Elbow Complete Left  08/23/2014   CLINICAL DATA:  Loss of consciousness with fall. Left elbow pain. Initial encounter.  EXAM: LEFT ELBOW - COMPLETE 3+ VIEW  COMPARISON:  None.  FINDINGS: There is no evidence of fracture, dislocation, or joint effusion. Enthesopathic ossification about the medial epicondyle.  IMPRESSION: Negative.   Electronically Signed   By: Monte Fantasia M.D.   On: 08/23/2014 02:59   Ct Head Wo Contrast  08/23/2014   CLINICAL DATA:  Fall.  Left head soreness.  Initial encounter.  EXAM: CT HEAD WITHOUT CONTRAST  TECHNIQUE: Contiguous axial images were obtained from the base of the skull through the vertex without intravenous contrast.  COMPARISON:  None.  FINDINGS: Skull and Sinuses:Negative for fracture or destructive process. The mastoids, middle ears,  and imaged paranasal sinuses are clear.  Orbits: Right cataract resection.  No traumatic findings.  Brain: No evidence of acute infarction, hemorrhage, hydrocephalus, or mass lesion/mass effect. Generalized atrophy which is mild for age. Intracranial atherosclerosis.  IMPRESSION: No evidence of intracranial injury or fracture.   Electronically Signed   By: Monte Fantasia M.D.   On: 08/23/2014 03:10   Dg Hips Bilat With Pelvis 3-4 Views  08/23/2014   CLINICAL DATA:  Status post fall, with bilateral hip pain. Initial encounter.  EXAM: BILATERAL HIP (WITH PELVIS) 3-4 VIEWS  COMPARISON:  None.  FINDINGS: There is no evidence of fracture or dislocation. Both femoral heads are seated normally within their respective acetabula. The proximal femurs appear intact. No significant degenerative change is appreciated. The sacroiliac joints are unremarkable in appearance.  The visualized bowel gas pattern is grossly  unremarkable in appearance. Scattered phleboliths are noted within the pelvis. Diffuse vascular calcifications are seen. A stent is noted along the left common iliac artery.  IMPRESSION: No evidence of fracture or dislocation.   Electronically Signed   By: Garald Balding M.D.   On: 08/23/2014 02:58    Scheduled Meds: . amiodarone  200 mg Oral Daily  . aspirin EC  81 mg Oral Daily  . budesonide (PULMICORT) nebulizer solution  0.25 mg Nebulization BID  . calcium-vitamin D  1 tablet Oral Q breakfast  . diltiazem  180 mg Oral Daily  . furosemide  40 mg Oral Daily  . insulin aspart  0-9 Units Subcutaneous TID WC  . ipratropium  0.5 mg Nebulization Q6H  . levalbuterol  0.63 mg Nebulization Q6H  . loratadine  10 mg Oral Daily  . pantoprazole  40 mg Oral BID  . potassium chloride SA  20 mEq Oral Daily  . rosuvastatin  10 mg Oral Daily  . sodium chloride  3 mL Intravenous Q12H  . cyanocobalamin  2,000 mcg Oral Daily   Continuous Infusions:   Time Spent:25 min   Charlynne Cousins  [facility] Pager [phone]. If 7PM-7AM, please contact night-coverage at [email or web address], password [facility] 08/23/2014, 9:56 AM  LOS: 0 days

## 2014-08-23 NOTE — ED Notes (Signed)
Report attempted, RN to call back. 

## 2014-08-23 NOTE — ED Notes (Signed)
Patient presents via EMS from home.  Reported that he sneezed and woke up on the floor.  C/o lower back pain and rib pain.  Sats were in the 80's upon their arrival.  2 Breathing treatments given 5mg  Albuterol and another one 5 albuterol and 0.5mg  atrovent with treatment sats 100%

## 2014-08-24 DIAGNOSIS — J440 Chronic obstructive pulmonary disease with acute lower respiratory infection: Secondary | ICD-10-CM

## 2014-08-24 DIAGNOSIS — M545 Low back pain: Secondary | ICD-10-CM

## 2014-08-24 LAB — BASIC METABOLIC PANEL
Anion gap: 6 (ref 5–15)
BUN: 11 mg/dL (ref 6–20)
CHLORIDE: 99 mmol/L — AB (ref 101–111)
CO2: 30 mmol/L (ref 22–32)
Calcium: 8.7 mg/dL — ABNORMAL LOW (ref 8.9–10.3)
Creatinine, Ser: 0.84 mg/dL (ref 0.61–1.24)
GFR calc Af Amer: >60 mL/min (ref >60)
GFR calc non Af Amer: >60 mL/min (ref >60)
GLUCOSE: 184 mg/dL — AB (ref 65–99)
Potassium: 4.4 mmol/L (ref 3.5–5.1)
SODIUM: 135 mmol/L (ref 135–145)

## 2014-08-24 LAB — CBC
HEMATOCRIT: 41 % (ref 39.0–52.0)
Hemoglobin: 13.1 g/dL (ref 13.0–17.0)
MCH: 31.8 pg (ref 26.0–34.0)
MCHC: 32 g/dL (ref 30.0–36.0)
MCV: 99.5 fL (ref 78.0–100.0)
Platelets: 175 10*3/uL (ref 150–400)
RBC: 4.12 MIL/uL — ABNORMAL LOW (ref 4.22–5.81)
RDW: 13.9 % (ref 11.5–15.5)
WBC: 7.1 10*3/uL (ref 4.0–10.5)

## 2014-08-24 LAB — GLUCOSE, CAPILLARY
GLUCOSE-CAPILLARY: 156 mg/dL — AB (ref 65–99)
Glucose-Capillary: 266 mg/dL — ABNORMAL HIGH (ref 65–99)
Glucose-Capillary: 258 mg/dL — ABNORMAL HIGH (ref 65–99)
Glucose-Capillary: 253 mg/dL — ABNORMAL HIGH (ref 65–99)

## 2014-08-24 MED ORDER — SODIUM CHLORIDE 0.9 % IV SOLN
INTRAVENOUS | Status: AC
  Administered 2014-08-24: 12:00:00 via INTRAVENOUS

## 2014-08-24 MED ORDER — METHYLPREDNISOLONE SODIUM SUCC 125 MG IJ SOLR
60.0000 mg | Freq: Three times a day (TID) | INTRAMUSCULAR | Status: DC
  Administered 2014-08-24 – 2014-08-29 (×14): 60 mg via INTRAVENOUS
  Filled 2014-08-24 (×17): qty 0.96

## 2014-08-24 NOTE — Evaluation (Signed)
Physical Therapy Evaluation Patient Details Name: [name] MRN: [id number] DOB: [birth year] Today's Date: 08/24/2014   History of Present Illness  HPI: [name] is a 74 y.o. male history of CAD status post CABG, chronic atrial fibrillation, COPD, diastolic CHF, hypertension presents to the ER after patient had a brief episode of loss of consciousness last night. Patient states he was walking from the den to his room and he sneezed and next thing he remembers was he was lying on the floor.   Clinical Impression  Pt admitted with above diagnosis. Pt currently with functional limitations due to the deficits listed below (see PT Problem List).  Pt will benefit from skilled PT to increase their independence and safety with mobility to allow discharge to the venue listed below.       Follow Up Recommendations SNF    Equipment Recommendations  Rolling walker with 5" wheels;3in1 (PT)    Recommendations for Other Services OT consult     Precautions / Restrictions Precautions Precautions: Back;Fall Precaution Comments: Back precautions for comfort      Mobility  Bed Mobility Overal bed mobility: Needs Assistance Bed Mobility: Sidelying to Sit   Sidelying to sit: Min assist       General bed mobility comments: Cues for technqiue and for log roll to help with pain  Transfers Overall transfer level: Needs assistance Equipment used: Rolling walker (2 wheeled) Transfers: Sit to/from Stand Sit to Stand: Min guard         General transfer comment: Minguard for safety; cues for hand placement  Ambulation/Gait Ambulation/Gait assistance: Min guard Ambulation Distance (Feet):  (pivot steps bed to chair) Assistive device: Rolling walker (2 wheeled) Gait Pattern/deviations: Shuffle     General Gait Details: Attempted to walk further, but once up, [name] stated he couldn't go further because of pain  Stairs            Wheelchair Mobility    Modified Rankin (Stroke  Patients Only)       Balance Overall balance assessment: Needs assistance         Standing balance support: Bilateral upper extremity supported Standing balance-Leahy Scale: Poor Standing balance comment: Dependent on UE support                             Pertinent Vitals/Pain Pain Assessment: 0-10 Pain Score: 5  Pain Location: back Pain Descriptors / Indicators: Aching;Discomfort;Grimacing;Guarding Pain Intervention(s): Limited activity within patient's tolerance;Monitored during session;RN gave pain meds during session    Home Living Family/patient expects to be discharged to:: Private residence Living Arrangements: Other relatives Available Help at Discharge: Family;Available PRN/intermittently (family does not assist with personal care) Type of Home: House Home Access: Stairs to enter   [employer] of Steps: 4 Home Layout: One level Home Equipment: None      Prior Function Level of Independence: Independent         Comments: pt reports he drives, only walks 284' max in house, holds onto furniture and performs own ADLs, brother-in-law does the cooking     Hand Dominance   Dominant Hand: Right    Extremity/Trunk Assessment   Upper Extremity Assessment: Generalized weakness           Lower Extremity Assessment: Generalized weakness         Communication   Communication: No difficulties  Cognition Arousal/Alertness: Awake/alert Behavior During Therapy: WFL for tasks assessed/performed Overall Cognitive Status: Within Functional  Limits for tasks assessed                      General Comments General comments (skin integrity, edema, etc.): Noted wheezing throughout session; O2 sats remained at acceptable levels with 2 L supplemental O2    Exercises        Assessment/Plan    PT Assessment Patient needs continued PT services  PT Diagnosis Difficulty walking;Acute pain   PT Problem List Decreased  strength;Decreased range of motion;Decreased activity tolerance;Decreased balance;Decreased mobility;Decreased knowledge of use of DME;Pain;Decreased knowledge of precautions  PT Treatment Interventions DME instruction;Gait training;Stair training;Functional mobility training;Therapeutic activities;Therapeutic exercise;Patient/family education   PT Goals (Current goals can be found in the Care Plan section) Acute Rehab PT Goals Patient Stated Goal: less pain PT Goal Formulation: With patient Time For Goal Achievement: 09/07/14 Potential to Achieve Goals: Good    Frequency Min 3X/week   Barriers to discharge Decreased caregiver support      Co-evaluation               End of Session Equipment Utilized During Treatment: Gait belt;Oxygen Activity Tolerance: Patient limited by pain;Patient tolerated treatment well Patient left: in chair;with call bell/phone within reach;with nursing/sitter in room Nurse Communication: Mobility status         Time: 1610-1620 PT Time Calculation (min) (ACUTE ONLY): 10 min   Charges:   PT Evaluation $Initial PT Evaluation Tier I: 1 Procedure     PT G CodesQuin Newman 08/24/2014, 6:10 PM  [name], [place]  Acute Rehabilitation Services Pager [phone] Office [phone]

## 2014-08-24 NOTE — Progress Notes (Signed)
Pt will attempt his MRI tomorrow, 08/25/14, per Dr. Olevia Bowens. Radiology was made aware and will call first thing in the morning to schedule a time.  Alexia Freestone, RN

## 2014-08-24 NOTE — Progress Notes (Signed)
[facility] PROGRESS NOTE Assessment/Plan: Syncope and collapse - Likely vasovagal, no events telemetry. - Troponins are negative, echocardiogram done on November 2015 showed an ejection fraction of 55%.  Low back pain: - X-ray of the back showed a L1 compression fracture, stable grade 2 anterolisthesis of L5 and S1 associated with degenerative disease. On physical examination he has straight leg sign positive. - Agree with MRI. - cont narcotics for pain.  Essential  Hypertension - Continue home medications.  Acute COPD (chronic obstructive pulmonary disease) exacerbation: - start IV steroids  Inhalers and antibiotics. - pt with poor air movement  And wheezing B/L  Chronic atrial fibrillation - Currently rate control with no events on telemetry. - He has not started his anticoagulation as it was held for recent left elbow hematoma. - Continue to hold a correlation.   Code Status: full Family Communication: none  Disposition Plan: home in 1-2 days   Consultants:  none  Procedures:  MRI spine  Lumbar spine x-ray  Antibiotics:  None  HPI/Subjective: Cont to complaing of back pain, able to walk to the bathroom but still SOB.  Objective: Filed Vitals:   08/23/14 2035 08/24/14 0144 08/24/14 0518 08/24/14 0855  BP:   134/68   Pulse:   69   Temp:   98 F (36.7 C)   TempSrc:   Oral   Resp:   18   Height:      Weight:   80.3 kg (177 lb 0.5 oz)   SpO2: 95% 96% 97% 97%    Intake/Output Summary (Last 24 hours) at 08/24/14 1101 Last data filed at 08/24/14 0000  Gross per 24 hour  Intake    243 ml  Output    750 ml  Net   -507 ml   Filed Weights   08/23/14 0514 08/24/14 0518  Weight: 81.375 kg (179 lb 6.4 oz) 80.3 kg (177 lb 0.5 oz)    Exam:  General: Alert, awake, oriented x3, in no acute distress.  HEENT: No bruits, no goiter.  Heart: Regular rate and rhythm. Lungs: Good air movement, wheezing B/L Abdomen: Soft, nontender, nondistended,  positive bowel sounds.  Neuro: Grossly intact, nonfocal.   Data Reviewed: Basic Metabolic Panel:  Recent Labs Lab 08/23/14 0205 08/23/14 0810 08/24/14 0501  NA 137 135 135  K 3.3* 3.9 4.4  CL 101 100* 99*  CO2 24 25 30   GLUCOSE 135* 136* 184*  BUN 10 10 11   CREATININE 0.89 0.85 0.84  CALCIUM 8.5* 8.1* 8.7*   Liver Function Tests:  Recent Labs Lab 08/23/14 0810  AST 22  ALT 15*  ALKPHOS 48  BILITOT 0.8  PROT 6.0*  ALBUMIN 3.3*   No results for input(s): LIPASE, AMYLASE in the last 168 hours. No results for input(s): AMMONIA in the last 168 hours. CBC:  Recent Labs Lab 08/23/14 0205 08/23/14 0810 08/24/14 0501  WBC 11.0* 9.3 7.1  NEUTROABS 7.8* 7.5  --   HGB 13.5 12.5* 13.1  HCT 41.6 39.3 41.0  MCV 99.3 99.5 99.5  PLT 191 189 175   Cardiac Enzymes:  Recent Labs Lab 08/23/14 0205 08/23/14 0810  CKTOTAL  --  95  TROPONINI <0.03 <0.03   BNP (last 3 results)  Recent Labs  05/10/14 0719 05/13/14 0317  BNP 190.0* 309.4*    ProBNP (last 3 results)  Recent Labs  03/17/14 1032  PROBNP 142.0*    CBG:  Recent Labs Lab 08/23/14 1716 08/23/14 2134 08/24/14 0621  GLUCAP 159* 220*  156*    No results found for this or any previous visit (from the past 240 hour(s)).   Studies: Dg Thoracic Spine W/swimmers  08/23/2014   CLINICAL DATA:  Fall tonight with upper and lower back pain.  EXAM: THORACIC SPINE - 2 VIEW + SWIMMERS  COMPARISON:  Chest CT 02/17/2010  FINDINGS: There is a compression fracture of T7 with advanced (greater than 75%) height loss, chronic. Superior endplate compression of T3 is also chronic based on previous CT. No new compression fracture noted. There is exaggerated thoracic kyphosis without subluxation. No focal bone lesion or endplate erosion.  CABG and left thyroidectomy.  No acute soft tissue findings.  Small hiatal hernia.  IMPRESSION: 1. No acute findings. 2. Remote compression fractures of T3 and T7, with height loss  advanced at T7.   Electronically Signed   By: Monte Fantasia M.D.   On: 08/23/2014 02:57   Dg Lumbar Spine Complete  08/23/2014   CLINICAL DATA:  Status post fall, with lower back pain. Initial encounter.  EXAM: LUMBAR SPINE - COMPLETE 4+ VIEW  COMPARISON:  Lumbar spine radiographs performed 04/15/2008  FINDINGS: There is compression deformity involving vertebral body L1, with approximately 25% loss of height. This is of indeterminate age, but may be chronic in nature, though new from 2010.  Vertebral bodies otherwise demonstrate normal height. There is stable grade 2 anterolisthesis of L5 on S1, with underlying facet disease.  The visualized bowel gas pattern is unremarkable in appearance; air and stool are noted within the colon. The sacroiliac joints are within normal limits. Clips are noted at the right upper quadrant. Diffuse vascular calcifications are seen, with a stent graft along the left common iliac artery.  IMPRESSION: 1. Compression deformity of vertebral body L1, with approximately 25% loss of height. This is new from 2010 and of indeterminate age, though it has a somewhat more chronic appearance. 2. Stable grade 2 anterolisthesis of L5 on S1, with associated degenerative change. 3. Diffuse vascular calcifications seen.   Electronically Signed   By: Garald Balding M.D.   On: 08/23/2014 02:57   Dg Elbow Complete Left  08/23/2014   CLINICAL DATA:  Loss of consciousness with fall. Left elbow pain. Initial encounter.  EXAM: LEFT ELBOW - COMPLETE 3+ VIEW  COMPARISON:  None.  FINDINGS: There is no evidence of fracture, dislocation, or joint effusion. Enthesopathic ossification about the medial epicondyle.  IMPRESSION: Negative.   Electronically Signed   By: Monte Fantasia M.D.   On: 08/23/2014 02:59   Ct Head Wo Contrast  08/23/2014   CLINICAL DATA:  Fall.  Left head soreness.  Initial encounter.  EXAM: CT HEAD WITHOUT CONTRAST  TECHNIQUE: Contiguous axial images were obtained from the base of the  skull through the vertex without intravenous contrast.  COMPARISON:  None.  FINDINGS: Skull and Sinuses:Negative for fracture or destructive process. The mastoids, middle ears, and imaged paranasal sinuses are clear.  Orbits: Right cataract resection.  No traumatic findings.  Brain: No evidence of acute infarction, hemorrhage, hydrocephalus, or mass lesion/mass effect. Generalized atrophy which is mild for age. Intracranial atherosclerosis.  IMPRESSION: No evidence of intracranial injury or fracture.   Electronically Signed   By: Monte Fantasia M.D.   On: 08/23/2014 03:10   Dg Hips Bilat With Pelvis 3-4 Views  08/23/2014   CLINICAL DATA:  Status post fall, with bilateral hip pain. Initial encounter.  EXAM: BILATERAL HIP (WITH PELVIS) 3-4 VIEWS  COMPARISON:  None.  FINDINGS: There is no  evidence of fracture or dislocation. Both femoral heads are seated normally within their respective acetabula. The proximal femurs appear intact. No significant degenerative change is appreciated. The sacroiliac joints are unremarkable in appearance.  The visualized bowel gas pattern is grossly unremarkable in appearance. Scattered phleboliths are noted within the pelvis. Diffuse vascular calcifications are seen. A stent is noted along the left common iliac artery.  IMPRESSION: No evidence of fracture or dislocation.   Electronically Signed   By: Garald Balding M.D.   On: 08/23/2014 02:58    Scheduled Meds: . amiodarone  200 mg Oral Daily  . aspirin EC  81 mg Oral Daily  . budesonide (PULMICORT) nebulizer solution  0.25 mg Nebulization BID  . calcium-vitamin D  1 tablet Oral Q breakfast  . diltiazem  180 mg Oral Daily  . doxycycline (VIBRAMYCIN) IV  100 mg Intravenous Q12H  . furosemide  40 mg Oral Daily  . insulin aspart  0-9 Units Subcutaneous TID WC  . ipratropium  0.5 mg Nebulization Q6H  . levalbuterol  0.63 mg Nebulization Q6H  . loratadine  10 mg Oral Daily  . methylPREDNISolone (SOLU-MEDROL) injection  60 mg  Intravenous Q12H  . pantoprazole  40 mg Oral BID  . polyethylene glycol  17 g Oral Daily  . potassium chloride SA  20 mEq Oral Daily  . rosuvastatin  10 mg Oral Daily  . sodium chloride  3 mL Intravenous Q12H  . tiotropium  18 mcg Inhalation Daily  . cyanocobalamin  2,000 mcg Oral Daily   Continuous Infusions:   Time Spent:25 min   Charlynne Cousins  [facility] Pager [phone]. If 7PM-7AM, please contact night-coverage at [email or web address], password [facility] 08/24/2014, 11:01 AM  LOS: 1 day

## 2014-08-25 ENCOUNTER — Encounter (HOSPITAL_COMMUNITY): Payer: Self-pay | Admitting: Radiology

## 2014-08-25 ENCOUNTER — Inpatient Hospital Stay (HOSPITAL_COMMUNITY): Payer: Medicare Other

## 2014-08-25 LAB — GLUCOSE, CAPILLARY
GLUCOSE-CAPILLARY: 274 mg/dL — AB (ref 65–99)
Glucose-Capillary: 219 mg/dL — ABNORMAL HIGH (ref 65–99)
Glucose-Capillary: 296 mg/dL — ABNORMAL HIGH (ref 65–99)
Glucose-Capillary: 158 mg/dL — ABNORMAL HIGH (ref 65–99)

## 2014-08-25 MED ORDER — INSULIN ASPART 100 UNIT/ML ~~LOC~~ SOLN
0.0000 [IU] | Freq: Three times a day (TID) | SUBCUTANEOUS | Status: DC
Start: 2014-08-25 — End: 2014-08-29
  Administered 2014-08-25: 8 [IU] via SUBCUTANEOUS
  Administered 2014-08-26 (×2): 2 [IU] via SUBCUTANEOUS
  Administered 2014-08-26 – 2014-08-27 (×2): 5 [IU] via SUBCUTANEOUS
  Administered 2014-08-27: 2 [IU] via SUBCUTANEOUS
  Administered 2014-08-27: 3 [IU] via SUBCUTANEOUS
  Administered 2014-08-28: 2 [IU] via SUBCUTANEOUS
  Administered 2014-08-28: 3 [IU] via SUBCUTANEOUS
  Administered 2014-08-29: 5 [IU] via SUBCUTANEOUS
  Administered 2014-08-29: 3 [IU] via SUBCUTANEOUS

## 2014-08-25 MED ORDER — IOHEXOL 350 MG/ML SOLN
80.0000 mL | Freq: Once | INTRAVENOUS | Status: AC | PRN
  Administered 2014-08-25: 80 mL via INTRAVENOUS

## 2014-08-25 MED ORDER — ENOXAPARIN SODIUM 40 MG/0.4ML ~~LOC~~ SOLN
40.0000 mg | SUBCUTANEOUS | Status: DC
  Administered 2014-08-25 – 2014-08-29 (×5): 40 mg via SUBCUTANEOUS
  Filled 2014-08-25 (×6): qty 0.4

## 2014-08-25 MED ORDER — POLYETHYLENE GLYCOL 3350 17 G PO PACK
17.0000 g | PACK | Freq: Once | ORAL | Status: AC
  Administered 2014-08-25: 17 g via ORAL
  Filled 2014-08-25: qty 1

## 2014-08-25 MED ORDER — SODIUM CHLORIDE 0.9 % IV SOLN
INTRAVENOUS | Status: DC
  Administered 2014-08-25 – 2014-08-27 (×4): via INTRAVENOUS
  Administered 2014-08-28: 75 mL/h via INTRAVENOUS

## 2014-08-25 MED ORDER — INSULIN ASPART 100 UNIT/ML ~~LOC~~ SOLN
3.0000 [IU] | Freq: Three times a day (TID) | SUBCUTANEOUS | Status: DC
  Administered 2014-08-25 – 2014-08-29 (×11): 3 [IU] via SUBCUTANEOUS

## 2014-08-25 MED ORDER — INSULIN ASPART 100 UNIT/ML ~~LOC~~ SOLN
0.0000 [IU] | Freq: Every day | SUBCUTANEOUS | Status: DC
  Administered 2014-08-26: 2 [IU] via SUBCUTANEOUS

## 2014-08-25 MED ORDER — LEVALBUTEROL HCL 0.63 MG/3ML IN NEBU
0.6300 mg | INHALATION_SOLUTION | RESPIRATORY_TRACT | Status: DC | PRN
  Administered 2014-08-25 – 2014-08-29 (×8): 0.63 mg via RESPIRATORY_TRACT
  Filled 2014-08-25 (×8): qty 3

## 2014-08-25 MED ORDER — INSULIN DETEMIR 100 UNIT/ML ~~LOC~~ SOLN
20.0000 [IU] | Freq: Every day | SUBCUTANEOUS | Status: DC
  Administered 2014-08-25 – 2014-08-29 (×5): 20 [IU] via SUBCUTANEOUS
  Filled 2014-08-25 (×6): qty 0.2

## 2014-08-25 NOTE — Progress Notes (Signed)
[facility] PROGRESS NOTE Assessment/Plan: Syncope and collapse - Likely vasovagal, no events telemetry. - Troponins are negative, echocardiogram done on November 2015 showed an ejection fraction of 55%. - Question of acute respiratory failure in the setting of COPD condition contribute to his cecal episode. Low back pain: - X-ray of the back showed a L1 compression fracture. Could not tolerate an MRI due to her shortness of breath. - We'll try to stabilize his difficulty breathing before performing an MRI. He continues to void and have bowel movements regularly. - cont narcotics for pain. Continues to significant amount of narcotics for pain we'll give an extra dose of MiraLAX.  Essential  Hypertension - Continue home medications.  Acute COPD (chronic obstructive pulmonary disease) exacerbation: - start IV steroids  Inhalers and antibiotics. - And wheezing bilaterally he required several treatments of when necessary Xopenex overnight and continues to be short of breath. - I will check a CT image of the chest to rule out a PE.  Chronic atrial fibrillation - Currently rate control with no events on telemetry. - He has not started his anticoagulation as it was held for recent left elbow hematoma. - Continue to hold a correlation.   Code Status: full Family Communication: none  Disposition Plan: home in 1-2 days   Consultants:  none  Procedures:  MRI spine  Lumbar spine x-ray  Antibiotics:  None  HPI/Subjective: Cont to complaing of back pain, continues to be significantly short of breath, several treatments of Xopenex overnight with no improvement.  Objective: Filed Vitals:   08/24/14 1448 08/24/14 1948 08/25/14 0500 08/25/14 0501  BP: 107/78 120/64  127/74  Pulse: 71 70  70  Temp: 98.5 F (36.9 C) 97.8 F (36.6 C)  97.4 F (36.3 C)  TempSrc: Oral Oral  Oral  Resp: 20 18  20   Height:      Weight:   81.1 kg (178 lb 12.7 oz)   SpO2: 97% 97%  96%     Intake/Output Summary (Last 24 hours) at 08/25/14 1011 Last data filed at 08/25/14 0603  Gross per 24 hour  Intake      3 ml  Output    100 ml  Net    -97 ml   Filed Weights   08/23/14 0514 08/24/14 0518 08/25/14 0500  Weight: 81.375 kg (179 lb 6.4 oz) 80.3 kg (177 lb 0.5 oz) 81.1 kg (178 lb 12.7 oz)    Exam:  General: Alert, awake, oriented x3, in no acute distress.  HEENT: No bruits, no goiter.  Heart: Regular rate and rhythm. Lungs: Poor air movement with wheezing bilaterally. Abdomen: Soft, nontender, nondistended, positive bowel sounds.  Neuro: Grossly intact, nonfocal.   Data Reviewed: Basic Metabolic Panel:  Recent Labs Lab 08/23/14 0205 08/23/14 0810 08/24/14 0501  NA 137 135 135  K 3.3* 3.9 4.4  CL 101 100* 99*  CO2 24 25 30   GLUCOSE 135* 136* 184*  BUN 10 10 11   CREATININE 0.89 0.85 0.84  CALCIUM 8.5* 8.1* 8.7*   Liver Function Tests:  Recent Labs Lab 08/23/14 0810  AST 22  ALT 15*  ALKPHOS 48  BILITOT 0.8  PROT 6.0*  ALBUMIN 3.3*   No results for input(s): LIPASE, AMYLASE in the last 168 hours. No results for input(s): AMMONIA in the last 168 hours. CBC:  Recent Labs Lab 08/23/14 0205 08/23/14 0810 08/24/14 0501  WBC 11.0* 9.3 7.1  NEUTROABS 7.8* 7.5  --   HGB 13.5 12.5* 13.1  HCT  41.6 39.3 41.0  MCV 99.3 99.5 99.5  PLT 191 189 175   Cardiac Enzymes:  Recent Labs Lab 08/23/14 0205 08/23/14 0810  CKTOTAL  --  95  TROPONINI <0.03 <0.03   BNP (last 3 results)  Recent Labs  05/10/14 0719 05/13/14 0317  BNP 190.0* 309.4*    ProBNP (last 3 results)  Recent Labs  03/17/14 1032  PROBNP 142.0*    CBG:  Recent Labs Lab 08/24/14 0621 08/24/14 1125 08/24/14 1611 08/24/14 2027 08/25/14 0602  GLUCAP 156* 266* 258* 253* 219*    No results found for this or any previous visit (from the past 240 hour(s)).   Studies: No results found.  Scheduled Meds: . amiodarone  200 mg Oral Daily  . aspirin EC  81 mg  Oral Daily  . budesonide (PULMICORT) nebulizer solution  0.25 mg Nebulization BID  . calcium-vitamin D  1 tablet Oral Q breakfast  . diltiazem  180 mg Oral Daily  . doxycycline (VIBRAMYCIN) IV  100 mg Intravenous Q12H  . enoxaparin (LOVENOX) injection  40 mg Subcutaneous Q24H  . furosemide  40 mg Oral Daily  . insulin aspart  0-9 Units Subcutaneous TID WC  . ipratropium  0.5 mg Nebulization Q6H  . levalbuterol  0.63 mg Nebulization Q6H  . loratadine  10 mg Oral Daily  . methylPREDNISolone (SOLU-MEDROL) injection  60 mg Intravenous 3 times per day  . pantoprazole  40 mg Oral BID  . polyethylene glycol  17 g Oral Daily  . potassium chloride SA  20 mEq Oral Daily  . rosuvastatin  10 mg Oral Daily  . sodium chloride  3 mL Intravenous Q12H  . tiotropium  18 mcg Inhalation Daily  . cyanocobalamin  2,000 mcg Oral Daily   Continuous Infusions:   Time Spent:25 min   Charlynne Cousins  [facility] Pager [phone]. If 7PM-7AM, please contact night-coverage at [email or web address], password [facility] 08/25/2014, 10:11 AM  LOS: 2 days

## 2014-08-25 NOTE — Progress Notes (Signed)
Respiratory therapy note-Asked to see patient again for another nebulizer treatment. BBS expiratory wheezes through out, can not speak in complete sentences, no change from this morning. Remains on 2l/min [place], with sp02 96%. RN notified and Dr. Venetia Constable called for update on status of patient. Family in room at this time. Continue to monitor.

## 2014-08-25 NOTE — Clinical Social Work Note (Signed)
Clinical Social Work Assessment  Patient Details  Name: [name] MRN: [id number] Date of Birth: May 25, 1940  Date of referral:  08/25/14               Reason for consult:  Facility Placement                Permission sought to share information with:    Permission granted to share information::  Yes, Verbal Permission Granted  Name::     [name]  Agency::  [facility]  Relationship::  son  Contact Information:     Housing/Transportation Living arrangements for the past 2 months:  Single Family Home Source of Information:  Patient, Adult Children Patient Interpreter Needed:  None Criminal Activity/Legal Involvement Pertinent to Current Situation/Hospitalization:  No - Comment as needed Significant Relationships:  Adult Children Lives with:  Siblings Do you feel safe going back to the place where you live?  Yes Need for family participation in patient care:  No (Coment)  Care giving concerns: Pt lives at home with his brother who pt states is "worse off" than the pt and unable to offer physical assistance   Facilities manager / plan:  CSW spoke with pt and pt son, [name], at bedside concerning PT recommendation for SNF  Employment status:  Retired Forensic scientist:  Medicare PT Recommendations:  [place] / Referral to community resources:  [place]  Patient/Family's Response to care:  Pt and son are agreeable to SNF placement for short term rehab- state that pt has been to [facility] in the past and would like to return if possible  Patient/Family's Understanding of and Emotional Response to Diagnosis, Current Treatment, and Prognosis:  Pt and son report now concerns about current treatment and do no have questions concerning pt prognosis at this time  Emotional Assessment Appearance:  Appears stated age, Well-Groomed Attitude/Demeanor/Rapport:    Affect (typically observed):  Calm, Quiet,  Appropriate Orientation:  Oriented to Self, Oriented to Place, Oriented to  Time, Oriented to Situation Alcohol / Substance use:  Not Applicable Psych involvement (Current and /or in the community):  No (Comment)  Discharge Needs  Concerns to be addressed:  Discharge Planning Concerns Readmission within the last 30 days:  No Current discharge risk:  Physical Impairment Barriers to Discharge:  Continued Medical Work up   [employer], LCSW 08/25/2014, 3:58 PM

## 2014-08-25 NOTE — Clinical Social Work Placement (Signed)
   CLINICAL SOCIAL WORK PLACEMENT  NOTE  Date:  08/25/2014  Patient Details  Name: [name] MRN: [id number] Date of Birth: 1941/02/08  Clinical Social Work is seeking post-discharge placement for this patient at the [place] level of care (*CSW will initial, date and re-position this form in  chart as items are completed):  Yes   Patient/family provided with [place] Work Department's list of facilities offering this level of care within the geographic area requested by the patient (or if unable, by the patient's family).  Yes   Patient/family informed of their freedom to choose among providers that offer the needed level of care, that participate in Medicare, Medicaid or managed care program needed by the patient, have an available bed and are willing to accept the patient.  Yes   Patient/family informed of [facility]'s ownership interest in [facility] and [facility], as well as of the fact that they are under no obligation to receive care at these facilities.  PASRR submitted to EDS on       PASRR number received on       Existing PASRR number confirmed on 08/25/14     FL2 transmitted to all facilities in geographic area requested by pt/family on 08/25/14     FL2 transmitted to all facilities within larger geographic area on       Patient informed that his/her managed care company has contracts with or will negotiate with certain facilities, including the following:            Patient/family informed of bed offers received.  Patient chooses bed at       Physician recommends and patient chooses bed at      Patient to be transferred to   on  .  Patient to be transferred to facility by       Patient family notified on   of transfer.  Name of family member notified:        PHYSICIAN Please sign FL2     Additional Comment:    _______________________________________________ Cranford Mon, LCSW 08/25/2014, 4:00 PM

## 2014-08-26 LAB — GLUCOSE, CAPILLARY
Glucose-Capillary: 148 mg/dL — ABNORMAL HIGH (ref 65–99)
Glucose-Capillary: 208 mg/dL — ABNORMAL HIGH (ref 65–99)
Glucose-Capillary: 145 mg/dL — ABNORMAL HIGH (ref 65–99)
Glucose-Capillary: 221 mg/dL — ABNORMAL HIGH (ref 65–99)

## 2014-08-26 LAB — HEMOGLOBIN A1C
Hgb A1c MFr Bld: 7.5 % — ABNORMAL HIGH (ref 4.8–5.6)
Hgb A1c MFr Bld: 7.5 % — ABNORMAL HIGH (ref 4.8–5.6)
Mean Plasma Glucose: 169 mg/dL
Mean Plasma Glucose: 169 mg/dL

## 2014-08-26 LAB — BASIC METABOLIC PANEL
Anion gap: 7 (ref 5–15)
BUN: 21 mg/dL — ABNORMAL HIGH (ref 6–20)
CO2: 29 mmol/L (ref 22–32)
Calcium: 8.6 mg/dL — ABNORMAL LOW (ref 8.9–10.3)
Chloride: 101 mmol/L (ref 101–111)
Creatinine, Ser: 0.89 mg/dL (ref 0.61–1.24)
GFR calc Af Amer: >60 mL/min (ref >60)
GFR calc non Af Amer: >60 mL/min (ref >60)
Glucose, Bld: 152 mg/dL — ABNORMAL HIGH (ref 65–99)
Potassium: 4.2 mmol/L (ref 3.5–5.1)
Sodium: 137 mmol/L (ref 135–145)

## 2014-08-26 MED ORDER — LEVALBUTEROL HCL 0.63 MG/3ML IN NEBU
0.6300 mg | INHALATION_SOLUTION | RESPIRATORY_TRACT | Status: DC
  Administered 2014-08-26 – 2014-08-27 (×7): 0.63 mg via RESPIRATORY_TRACT
  Filled 2014-08-26 (×11): qty 3

## 2014-08-26 MED ORDER — LORAZEPAM 2 MG/ML IJ SOLN: 0.5000 mg | Freq: Once | INTRAMUSCULAR | Status: DC

## 2014-08-26 MED ORDER — IPRATROPIUM BROMIDE 0.02 % IN SOLN
0.5000 mg | RESPIRATORY_TRACT | Status: DC
  Administered 2014-08-26 – 2014-08-27 (×7): 0.5 mg via RESPIRATORY_TRACT
  Filled 2014-08-26 (×7): qty 2.5

## 2014-08-26 MED ORDER — LORAZEPAM 2 MG/ML IJ SOLN: 1.0000 mg | Freq: Once | INTRAMUSCULAR | Status: DC

## 2014-08-26 MED ORDER — HYDROXYZINE HCL 10 MG/5ML PO SYRP
10.0000 mg | ORAL_SOLUTION | Freq: Three times a day (TID) | ORAL | Status: DC | PRN
  Administered 2014-08-27: 10 mg via ORAL
  Filled 2014-08-26 (×2): qty 5

## 2014-08-26 NOTE — Progress Notes (Signed)
Physical Therapy Treatment Patient Details Name: AMERE BRICCO MRN: [id number] DOB: [birth year] Today's Date: 08/26/2014    History of Present Illness HPI: TRINDON DORTON is a 74 y.o. male history of CAD status post CABG, chronic atrial fibrillation, COPD, diastolic CHF, hypertension presents to the ER after patient had a brief episode of loss of consciousness last night. Patient states he was walking from the den to his room and he sneezed and next thing he remembers was he was lying on the floor.     PT Comments    Pt progressing towards physical therapy goals. Pt was educated on the benefits of OOB, proper positioning in the chair, and appropriate length of time to be up at one time. Encouraged transfer to chair for meals, sitting up as tolerated in between meals. Pt appears to have a decreased awareness of his deficits at times. Wanted to take his oxygen off to walk, and was prepared to attempt a longer distance at the same time. Pt with significantly labored breathing and fatigue after 40 feet with supplemental O2 donned. Requires cues for self-monitoring for energy conservation.   Follow Up Recommendations  SNF     Equipment Recommendations  Rolling walker with 5" wheels;3in1 (PT)    Recommendations for Other Services       Precautions / Restrictions Precautions Precautions: Back;Fall Precaution Comments: Back precautions for comfort Restrictions Weight Bearing Restrictions: No    Mobility  Bed Mobility Overal bed mobility: Needs Assistance Bed Mobility: Sidelying to Sit   Sidelying to sit: Supervision       General bed mobility comments: Heavy use of bed rails for support. Pt was able to transition from sidelying to sit without physical assistance, however supervision was required for safety.   Transfers Overall transfer level: Needs assistance Equipment used: Rolling walker (2 wheeled) Transfers: Sit to/from Stand Sit to Stand: Min guard         General transfer  comment: VC's for hand placement on seated surface for safety. Increased time to power-up to full standing position.   Ambulation/Gait Ambulation/Gait assistance: Min guard Ambulation Distance (Feet): 40 Feet Assistive device: Rolling walker (2 wheeled) Gait Pattern/deviations: Step-through pattern;Decreased stride length;Trunk flexed Gait velocity: Decreased Gait velocity interpretation: Below normal speed for age/gender General Gait Details: Pt was able to ambulate ~40 feet (out to hall and back to chair). Pt reports he wants to go farther without the oxygen to see how he feels. Encouraged a shorter distance with O2 donned as wheezing increased with activity. When pt reached the chair at end of gait training, appeared very fatigued with labored breathing. O2 sats at 93% and pt recovered to a normal breathing pattern (still wheezing however) within 1 minute.    Stairs            Wheelchair Mobility    Modified Rankin (Stroke Patients Only)       Balance Overall balance assessment: Needs assistance Sitting-balance support: Feet supported;No upper extremity supported Sitting balance-Leahy Scale: Fair     Standing balance support: Bilateral upper extremity supported Standing balance-Leahy Scale: Poor Standing balance comment: Requires UE support at this time to maintain standing balance.                     Cognition Arousal/Alertness: Awake/alert Behavior During Therapy: WFL for tasks assessed/performed Overall Cognitive Status: Within Functional Limits for tasks assessed  Exercises      General Comments        Pertinent Vitals/Pain Pain Assessment: 0-10 Pain Score: 8  Pain Location: Back pain - 8/10 active, 5/10 at rest Pain Descriptors / Indicators: Aching;Grimacing;Guarding Pain Intervention(s): Limited activity within patient's tolerance;Monitored during session;Premedicated before session;Repositioned    Home Living                       Prior Function            PT Goals (current goals can now be found in the care plan section) Acute Rehab PT Goals Patient Stated Goal: Get back home PT Goal Formulation: With patient Time For Goal Achievement: 09/07/14 Potential to Achieve Goals: Good Progress towards PT goals: Progressing toward goals    Frequency  Min 3X/week    PT Plan Current plan remains appropriate    Co-evaluation             End of Session Equipment Utilized During Treatment: Gait belt;Oxygen Activity Tolerance: No increased pain;Patient limited by fatigue Patient left: in chair;with call bell/phone within reach     Time: 0800-0824 PT Time Calculation (min) (ACUTE ONLY): 24 min  Charges:  $Gait Training: 8-22 mins $Therapeutic Activity: 8-22 mins                    G Codes:      Rolinda Roan 09/15/2014, 8:39 AM  Rolinda Roan, PT, DPT Acute Rehabilitation Services Pager: [phone]

## 2014-08-26 NOTE — Care Management (Signed)
Medicare Important Message given? Yes (If Response is "NO", the following Medicare IM given date fields will be blank) Date Medicare IM given: 08/26/14 Medicare IM given by: Dierks Wach 

## 2014-08-26 NOTE — Progress Notes (Signed)
Pt son called RN regarding when pt would be going for MRI. Pt son informed RN that pt cannot take Ativan that was ordered pre-MRI.   Baltazar Najjar, NP paged and made aware that this is also a medication allergy and that pt son stated " He is out of it for three days when he takes Ativan; he cannot have that medication."   Baltazar Najjar returned page and informed RN that MRI will be postponed until tomorrow due to pt current respiratory status and medication allergy to Ativan. Ativan order discontinued. RN will cont to monitor.

## 2014-08-26 NOTE — Progress Notes (Signed)
Pt daughter expressed concern about pt hearing voices and disoriented. Pt daughter educated about delirium. Pt bed alarm and chair alarm used. Call bell within reach. Instructed not to get up without calling. Report given to night shift nurse and nurse tech to monitor safety.   Raliegh Ip RN

## 2014-08-26 NOTE — Progress Notes (Signed)
[facility] PROGRESS NOTE Assessment/Plan: Syncope and collapse - Likely vasovagal, no events telemetry. - Troponins are negative, echocardiogram done on November 2015 showed an ejection fraction of 55%. - Question of acute respiratory failure in the setting of COPD condition contribute to his syncopal episode.  Low back pain: - X-ray of the back showed a L1 compression fracture. Try to get MRI today could not tolerate due to her SOB. - use ativan for sedation. - cont narcotics for pain. Continues to use significant amount of narcotics for pain we'll give an extra dose of MiraLAX.  Essential  Hypertension - Continue home medications.  Acute COPD (chronic obstructive pulmonary disease) exacerbation: - Start IV steroids  Inhalers and antibiotics. - Now moving more air than previous days. I think there is a degree of vocal cord dysfunction. - CT showed no PE.  Chronic atrial fibrillation - Currently rate control with no events on telemetry. - He has not started his anticoagulation as it was held for recent left elbow hematoma. - Continue to hold a correlation.   Code Status: full Family Communication: none  Disposition Plan: home in 1-2 days   Consultants:  none  Procedures:  MRI spine  Lumbar spine x-ray  Antibiotics:  None  HPI/Subjective: Cont to complaing of back pain, he relates SOB slightly improved. Objective: Filed Vitals:   08/26/14 0247 08/26/14 0343 08/26/14 0858 08/26/14 1029  BP:  129/67  150/80  Pulse:  72  73  Temp:  97.9 F (36.6 C)    TempSrc:  Oral    Resp:  18  20  Height:      Weight:  64.456 kg (142 lb 1.6 oz)    SpO2: 93% 94% 95% 98%    Intake/Output Summary (Last 24 hours) at 08/26/14 1032 Last data filed at 08/26/14 0900  Gross per 24 hour  Intake    240 ml  Output    411 ml  Net   -171 ml   Filed Weights   08/24/14 0518 08/25/14 0500 08/26/14 0343  Weight: 80.3 kg (177 lb 0.5 oz) 81.1 kg (178 lb 12.7 oz) 64.456 kg  (142 lb 1.6 oz)    Exam:  General: Alert, awake, oriented x3, in no acute distress.  HEENT: No bruits, no goiter.  Heart: Regular rate and rhythm. Lungs: good air movement with upper resp tract wheezing. Abdomen: Soft, nontender, nondistended, positive bowel sounds.  Neuro: Grossly intact, nonfocal.   Data Reviewed: Basic Metabolic Panel:  Recent Labs Lab 08/23/14 0205 08/23/14 0810 08/24/14 0501 08/26/14 0404  NA 137 135 135 137  K 3.3* 3.9 4.4 4.2  CL 101 100* 99* 101  CO2 24 25 30 29   GLUCOSE 135* 136* 184* 152*  BUN 10 10 11  21*  CREATININE 0.89 0.85 0.84 0.89  CALCIUM 8.5* 8.1* 8.7* 8.6*   Liver Function Tests:  Recent Labs Lab 08/23/14 0810  AST 22  ALT 15*  ALKPHOS 48  BILITOT 0.8  PROT 6.0*  ALBUMIN 3.3*   No results for input(s): LIPASE, AMYLASE in the last 168 hours. No results for input(s): AMMONIA in the last 168 hours. CBC:  Recent Labs Lab 08/23/14 0205 08/23/14 0810 08/24/14 0501  WBC 11.0* 9.3 7.1  NEUTROABS 7.8* 7.5  --   HGB 13.5 12.5* 13.1  HCT 41.6 39.3 41.0  MCV 99.3 99.5 99.5  PLT 191 189 175   Cardiac Enzymes:  Recent Labs Lab 08/23/14 0205 08/23/14 0810  CKTOTAL  --  95  TROPONINI <  0.03 <0.03   BNP (last 3 results)  Recent Labs  05/10/14 0719 05/13/14 0317  BNP 190.0* 309.4*    ProBNP (last 3 results)  Recent Labs  03/17/14 1032  PROBNP 142.0*    CBG:  Recent Labs Lab 08/25/14 0602 08/25/14 1136 08/25/14 1641 08/25/14 2105 08/26/14 0601  GLUCAP 219* 296* 274* 158* 148*    No results found for this or any previous visit (from the past 240 hour(s)).   Studies: Ct Angio Chest Pe W/cm &/or Wo Cm  08/25/2014   CLINICAL DATA:  Shortness of breath and COPD.  Wheezing.  EXAM: CT ANGIOGRAPHY CHEST WITH CONTRAST  TECHNIQUE: Multidetector CT imaging of the chest was performed using the standard protocol during bolus administration of intravenous contrast. Multiplanar CT image reconstructions and MIPs  were obtained to evaluate the vascular anatomy.  CONTRAST:  70mL OMNIPAQUE IOHEXOL 350 MG/ML SOLN  COMPARISON:  02/17/2010  FINDINGS: Mediastinum: Previous median sternotomy and CABG procedure. Normal heart size. The trachea appears patent and is midline. Moderate hiatal hernia noted. No mediastinal or hilar adenopathy identified. No enlarged axillary or supraclavicular adenopathy. The main pulmonary artery is patent. No lobar or segmental pulmonary artery filling defects identified.  Lungs/Pleura: Small bilateral pleural effusions are identified with overlying compressive type atelectasis. Moderate to advanced changes of centrilobular and paraseptal emphysema noted. Diffuse bronchial wall thickening identified.  Upper Abdomen: The visualized portions of the liver and spleen appear normal. The adrenal glands are unremarkable. Normal appearance of the visualized portions of the pancreas.  Musculoskeletal: Degenerative disc disease is noted within the thoracic spine. No aggressive lytic or sclerotic bone lesion. Compression fractures are identified at the T7 and L1 level. These are likely chronic.  Review of the MIP images confirms the above findings.  IMPRESSION: 1. No evidence for acute pulmonary embolus. 2. Small bilateral pleural effusions with overlying compressive type consolidation/atelectasis. 3. Diffuse bronchial wall thickening with emphysema, as above; imaging findings suggestive of underlying COPD. 4. Age indeterminate but likely chronic T7 and L1 compression deformities. 5. Previous CABG.   Electronically Signed   By: Kerby Moors M.D.   On: 08/25/2014 11:01    Scheduled Meds: . amiodarone  200 mg Oral Daily  . aspirin EC  81 mg Oral Daily  . budesonide (PULMICORT) nebulizer solution  0.25 mg Nebulization BID  . calcium-vitamin D  1 tablet Oral Q breakfast  . diltiazem  180 mg Oral Daily  . doxycycline (VIBRAMYCIN) IV  100 mg Intravenous Q12H  . enoxaparin (LOVENOX) injection  40 mg  Subcutaneous Q24H  . insulin aspart  0-15 Units Subcutaneous TID WC  . insulin aspart  0-5 Units Subcutaneous QHS  . insulin aspart  3 Units Subcutaneous TID WC  . insulin detemir  20 Units Subcutaneous Daily  . ipratropium  0.5 mg Nebulization Q4H  . levalbuterol  0.63 mg Nebulization Q4H  . loratadine  10 mg Oral Daily  . methylPREDNISolone (SOLU-MEDROL) injection  60 mg Intravenous 3 times per day  . pantoprazole  40 mg Oral BID  . polyethylene glycol  17 g Oral Daily  . potassium chloride SA  20 mEq Oral Daily  . rosuvastatin  10 mg Oral Daily  . sodium chloride  3 mL Intravenous Q12H  . cyanocobalamin  2,000 mcg Oral Daily   Continuous Infusions: . sodium chloride 75 mL/hr at 08/26/14 1009    Time Spent:25 min   Charlynne Cousins  [facility] Pager [phone]. If 7PM-7AM, please contact night-coverage at  [email or web address], password [facility] 08/26/2014, 10:32 AM  LOS: 3 days

## 2014-08-26 NOTE — Progress Notes (Signed)
Patient unable to perform CPT at this time due to increased respiratory rate.  Will continue to monitor.

## 2014-08-27 ENCOUNTER — Inpatient Hospital Stay (HOSPITAL_COMMUNITY): Payer: Medicare Other

## 2014-08-27 DIAGNOSIS — J438 Other emphysema: Secondary | ICD-10-CM

## 2014-08-27 DIAGNOSIS — R062 Wheezing: Secondary | ICD-10-CM

## 2014-08-27 DIAGNOSIS — M5440 Lumbago with sciatica, unspecified side: Secondary | ICD-10-CM

## 2014-08-27 DIAGNOSIS — R0902 Hypoxemia: Secondary | ICD-10-CM

## 2014-08-27 DIAGNOSIS — Z515 Encounter for palliative care: Secondary | ICD-10-CM

## 2014-08-27 LAB — BASIC METABOLIC PANEL
Anion gap: 10 (ref 5–15)
BUN: 24 mg/dL — ABNORMAL HIGH (ref 6–20)
CO2: 28 mmol/L (ref 22–32)
Calcium: 8.7 mg/dL — ABNORMAL LOW (ref 8.9–10.3)
Chloride: 99 mmol/L — ABNORMAL LOW (ref 101–111)
Creatinine, Ser: 1.04 mg/dL (ref 0.61–1.24)
GFR calc Af Amer: >60 mL/min (ref >60)
GFR calc non Af Amer: >60 mL/min (ref >60)
GLUCOSE: 217 mg/dL — AB (ref 65–99)
POTASSIUM: 4.5 mmol/L (ref 3.5–5.1)
Sodium: 137 mmol/L (ref 135–145)

## 2014-08-27 LAB — GLUCOSE, CAPILLARY
GLUCOSE-CAPILLARY: 195 mg/dL — AB (ref 65–99)
GLUCOSE-CAPILLARY: 134 mg/dL — AB (ref 65–99)
Glucose-Capillary: 238 mg/dL — ABNORMAL HIGH (ref 65–99)
Glucose-Capillary: 64 mg/dL — ABNORMAL LOW (ref 65–99)
Glucose-Capillary: 96 mg/dL (ref 65–99)

## 2014-08-27 LAB — RAPID STREP SCREEN (MED CTR MEBANE ONLY): Streptococcus, Group A Screen (Direct): NEGATIVE

## 2014-08-27 MED ORDER — HYDROCORTISONE 2.5 % RE CREA
TOPICAL_CREAM | Freq: Two times a day (BID) | RECTAL | Status: DC
  Administered 2014-08-27: 11:00:00 via RECTAL
  Filled 2014-08-27: qty 28.35

## 2014-08-27 MED ORDER — LEVALBUTEROL HCL 0.63 MG/3ML IN NEBU
0.6300 mg | INHALATION_SOLUTION | Freq: Four times a day (QID) | RESPIRATORY_TRACT | Status: DC
  Administered 2014-08-27 – 2014-08-29 (×7): 0.63 mg via RESPIRATORY_TRACT
  Filled 2014-08-27 (×15): qty 3

## 2014-08-27 MED ORDER — RACEPINEPHRINE HCL 2.25 % IN NEBU
0.5000 mL | INHALATION_SOLUTION | Freq: Once | RESPIRATORY_TRACT | Status: AC
  Administered 2014-08-27: 0.5 mL via RESPIRATORY_TRACT
  Filled 2014-08-27: qty 0.5

## 2014-08-27 MED ORDER — IPRATROPIUM BROMIDE 0.02 % IN SOLN
0.5000 mg | Freq: Four times a day (QID) | RESPIRATORY_TRACT | Status: DC
  Administered 2014-08-27 – 2014-08-29 (×7): 0.5 mg via RESPIRATORY_TRACT
  Filled 2014-08-27 (×7): qty 2.5

## 2014-08-27 MED ORDER — BUDESONIDE 0.5 MG/2ML IN SUSP
0.5000 mg | Freq: Two times a day (BID) | RESPIRATORY_TRACT | Status: DC
  Administered 2014-08-27 – 2014-08-29 (×4): 0.5 mg via RESPIRATORY_TRACT
  Filled 2014-08-27 (×7): qty 2

## 2014-08-27 NOTE — Progress Notes (Signed)
Inpatient Diabetes Program Recommendations  AACE/ADA: New Consensus Statement on Inpatient Glycemic Control  Target Ranges:  Prepandial:   less than 140 mg/dL      Peak postprandial:   less than 180 mg/dL (1-2 hours)      Critically ill patients:  140 - 180 mg/dL  Pager:  [phone]    Reason for Visit: Elevated glucose Results for [name], [name] (MRN [id number]) as of 08/27/2014 10:30  Ref. Range 08/26/2014 06:01 08/26/2014 11:36 08/26/2014 16:06 08/26/2014 21:17 08/27/2014 06:03  Glucose-Capillary Latest Ref Range: 65-99 mg/dL 148 (H) 208 (H) 145 (H) 221 (H) 195 (H)     Inpatient Diabetes Program Recommendations Insulin - Basal: Increase Levemir to 25-30 units while on IV steroids  Courtney Heys PhD, RN, BC-ADM Diabetes Coordinator  Office:  [phone] Team Pager:  [phone]

## 2014-08-27 NOTE — Progress Notes (Signed)
CSW spoke with pt and pt son at bedside concerning bed offers.  Pt and son choose [facility].  Pt son expressed interested in using VA benefit to pay for hospital stay/SNF- CSW explained VA benefit with SNF.  Pt son would like to use Medicare for SNF so that pt can go to [facility] for rehab but is still interested in [place] paying for hospitilization- CSW referred pt to Billing department to discuss.  CSW will continue to follow.  Domenica Reamer, [place] Social Worker [phone]

## 2014-08-27 NOTE — Progress Notes (Signed)
Hypoglycemic Event  CBG: 64  Treatment: Orange juice  Symptoms: none  Follow-up CBG: Time: 2127 CBG Result: 96  Possible Reasons for Event:   [name] B

## 2014-08-27 NOTE — Consult Note (Signed)
Name: [name] MRN: [id number] DOB: June 17, 1940    ADMISSION DATE:  08/23/2014 CONSULTATION DATE:  08/27/14  REFERRING MD :  Cruzita Lederer   CHIEF COMPLAINT:  AECOPD   BRIEF PATIENT DESCRIPTION:  74yo male with hx HTN, CAD, Afib, DM, Gold III COPD, dCHF admitted 5/28 with syncope thought secondary to vasovagal event with essentially negative workup.  Has had progressive SOB and AECOPD, PCCM consulted 6/1.   SIGNIFICANT EVENTS    STUDIES:  CT head 5/30>>> negative CTA chest 5/30>>> neg PE, severe COPD   HISTORY OF PRESENT ILLNESS: 74yo male with hx HTN, CAD, Afib, DM, Gold III COPD, dCHF admitted 5/28 with syncope thought secondary to vasovagal event with essentially negative workup.  Has had progressive SOB and AECOPD, PCCM consulted 6/1.   Pt states he has chronic SOB from COPD, is NOT on home O2.  SOB has been progressive this admission with c/o audible wheezing.  Denies chest pain, lightheadedness, further syncope, fevers, chills, purulent sputum.   PAST MEDICAL HISTORY :   has a past medical history of HYPERLIPIDEMIA (11/21/2006); HYPERTENSION (11/21/2006); MYOCARDIAL INFARCTION, HX OF (11/21/2006); CORONARY ARTERY DISEASE (11/21/2006); OSTEOPOROSIS (11/21/2006); COLONIC POLYPS (05/21/2007); COPD (05/21/2007); HIATAL HERNIA (05/21/2007); DIVERTICULAR DISEASE (05/21/2007); NEPHROLITHIASIS (05/21/2007); GI BLEEDING (09/09/2008); ANEMIA, B12 DEFICIENCY (12/02/2008); CAROTID ARTERY STENOSIS (04/16/2009); PERIPHERAL VASCULAR DISEASE (09/02/2009); Atrial fibrillation (11/06/2009); Allergic rhinitis; Osteoporosis; Hemoptysis; Internal hemorrhoids; GERD (gastroesophageal reflux disease); Cataract; Dysrhythmia; Shortness of breath dyspnea; Pneumonia; Arthritis; History of kidney stones; and Diabetes mellitus without complication.  has past surgical history that includes Thyroidectomy; Coronary stent placement; Cholecystectomy; Appendectomy; Hemorrhoid surgery; Inguinal hernia repair (10/10/08); Coronary artery bypass  graft (2011); Mitral valve replacement; Cardioversion (N/A, 04/16/2014); Olecranon bursectomy (07/09/2014); and Mass excision (Left, 07/09/2014). Prior to Admission medications   Medication Sig Start Date End Date Taking? Authorizing Provider  amiodarone (PACERONE) 200 MG tablet TAKE 1 TABLET (200 MG TOTAL) BY MOUTH DAILY. 07/25/14  Yes Marin Olp, MD  aspirin EC 81 MG tablet Take 81 mg by mouth daily.   Yes Historical Provider, MD  Calcium Carbonate-Vit D-Min (CALTRATE 600+D PLUS) 600-400 MG-UNIT per tablet Chew 1 tablet by mouth daily.     Yes Historical Provider, MD  cetirizine (ZYRTEC ALLERGY) 10 MG tablet Take 1 tablet (10 mg total) by mouth every morning. 08/17/10  Yes Tammy S Parrett, NP  cyanocobalamin ([employer] VITAMIN B12) 2000 MCG tablet Take 1 tablet (2,000 mcg total) by mouth daily. 04/02/12  Yes Lisabeth Pick, MD  diltiazem (CARDIZEM CD) 180 MG 24 hr capsule Take 1 capsule (180 mg total) by mouth daily. 03/17/14  Yes Burtis Junes, NP  DULERA 100-5 MCG/ACT AERO INHALE 2 PUFFS INTO THE LUNGS TWICE A DAY Patient taking differently: INHALE 2 PUFFS INTO THE LUNGS TWICE A DAY AS NEEDED FOR SHORTNESS OF BREATH 11/28/12  Yes Lisabeth Pick, MD  furosemide (LASIX) 40 MG tablet Take 1 tablet (40 mg total) by mouth daily. 03/17/14  Yes Burtis Junes, NP  pantoprazole (PROTONIX) 40 MG tablet TAKE 1 TABLET BY MOUTH TWICE A DAY 05/19/14  Yes Marin Olp, MD  potassium chloride SA (K-DUR,KLOR-CON) 20 MEQ tablet Take 1 tablet (20 mEq total) by mouth daily. 03/17/14  Yes Burtis Junes, NP  rosuvastatin (CRESTOR) 20 MG tablet Take 10 mg by mouth daily.    Yes Historical Provider, MD  SPIRIVA HANDIHALER 18 MCG inhalation capsule INHALE CONTENTS OF 1 CAPSULE DAILY 01/24/14  Yes Lisabeth Pick, MD  triamcinolone (KENALOG) 0.025 %  ointment APPLY 1 A SMALL AMOUNT TO AFFECTED AREA TWICE A DAY Patient taking differently: APPLY 1 A SMALL AMOUNT TO AFFECTED AREA TWICE A DAY AS NEEDED FOR SHORTNESS OF BREATH  09/10/10  Yes Lisabeth Pick, MD  XOPENEX HFA 45 MCG/ACT inhaler INHALE 2 PUFFS INTO THE LUNGS EVERY 4 (FOUR) HOURS AS NEEDED FOR WHEEZING. 05/08/14  Yes Marin Olp, MD  dabigatran (PRADAXA) 150 MG CAPS capsule Take 1 capsule (150 mg total) by mouth 2 (two) times daily. Patient not taking: Reported on 08/23/2014 08/20/14   Liliane Shi, PA-C   Allergies  Allergen Reactions  . Xarelto [Rivaroxaban] Other (See Comments)    makes pt crazy  . Ticlopidine Hcl Swelling  . Lorazepam     Altered mental status from 2 doses of IV Ativan on 05/10/14.    FAMILY HISTORY:  family history includes Arthritis in his mother; Colon cancer (age of onset: 4) in his brother; Heart attack in his father; Heart disease in his brother, brother, and father. There is no history of Stomach cancer. SOCIAL HISTORY:  reports that he quit smoking about 4 years ago. His smoking use included Cigarettes. He has a 150 pack-year smoking history. He has never used smokeless tobacco. He reports that he drinks about 3.0 oz of alcohol per week. He reports that he does not use illicit drugs.  REVIEW OF SYSTEMS:   As per HPI - All other systems reviewed and were neg.    SUBJECTIVE:   VITAL SIGNS: Temp:  [97.6 F (36.4 C)-97.8 F (36.6 C)] 97.6 F (36.4 C) (06/01 0432) Pulse Rate:  [70-76] 76 (06/01 1141) Resp:  [18-20] 18 (06/01 1141) BP: (138-157)/(65-87) 157/87 mmHg (06/01 1037) SpO2:  [96 %-98 %] 96 % (06/01 0432) Weight:  [158 lb 6.4 oz (71.85 kg)] 158 lb 6.4 oz (71.85 kg) (06/01 0432)  PHYSICAL EXAMINATION: General:  Pleasant chronically ill appearing male, NAD in chair  Neuro:  Awake, alert, appropriate, MAE  HEENT:  Mm dry, no JVD  Cardiovascular:  s1s2 irreg  Lungs:  resps even, mildly labored, upper airway wheeze improved with pursed lip breathing, faint diffuse expiratory wheeze Abdomen:  Soft, +bs  Musculoskeletal:  Warm and dry, trace edema     Recent Labs Lab 08/24/14 0501 08/26/14 0404  08/27/14 0415  NA 135 137 137  K 4.4 4.2 4.5  CL 99* 101 99*  CO2 30 29 28   BUN 11 21* 24*  CREATININE 0.84 0.89 1.04  GLUCOSE 184* 152* 217*    Recent Labs Lab 08/23/14 0205 08/23/14 0810 08/24/14 0501  HGB 13.5 12.5* 13.1  HCT 41.6 39.3 41.0  WBC 11.0* 9.3 7.1  PLT 191 189 175   No results found.  ASSESSMENT / PLAN:  AECOPD -- PFT's 02/17/12 FEV1 1.20 (49%)  REC -  Continue supplemental O2  Will need ambulatory desat prior to d/c - suspect he needs home O2  Continue solumedrol and taper slowly as able  Continue doxycycline - would rec change to PO  Continue budesonide - change to 0.5mg  BID  Continue xopenex, atrovent but change to q6h  Racemic epi x1  Pulmonary hygiene  outpt pulm f/u    Of note - pt and son state he is to be DNR/DNI.  Will place that order.     Nickolas Madrid, NP 08/27/2014  11:48 AM Pager: [phone] or [phone]  Attending note:  74 year old male with ES-COPD presenting to PCCM with SOB and wheezing.  Has  been treated with abx and steroids with minimal improvement.  On exam patient's wheezing is mostly from the upper airway.  Patient does not have O2 at home.  I reviewed chest CT myself, severe emphysema and evidence of hyperinflation.  Discussed with PCCM NP and Dr. Cruzita Lederer.  ES-COPD with exacerbation  - Solumedrol as ordered.  - Budesonide nebs as ordered.  - Continue doxycycline, I see no evidence of infection.  Only for airway sterilization.  - Xopenex and atrovent changed to q6 hours.  Wheezing: mostly from the upper airway.  No evidence of aspiration but does report GERD.  - PPI to BID.  - Minimize irritants.  - Racemic epi as ordered.  - Taught patient how to purse lip breath with significant improvement.  Hypoxemia: likely at baseline but never identified.  - Titrate O2 for sat of 88-92%.  - Ambulatory desaturation study to qualify from O2 prior to discharge.  Palliative care: Spoke with patient and son  extensively.  Evidently he was intubated for surgery 3 years ago and had a very difficult time coming off the vent.  After discussion, decision was made to make patient a full DNR.  Will enter order.  PCCM will follow.  Patient seen and examined, agree with above note.  I dictated the care and orders written for this patient under my direction.  Rush Farmer, MD [phone]

## 2014-08-27 NOTE — Progress Notes (Signed)
MRI called and stated pt hit head on MRI machine. Bruise noted on L forehead. Nurse told in report. Night nurse will continue to monitor.  Kaely Hollan Ronne Binning

## 2014-08-27 NOTE — Progress Notes (Signed)
[facility] PROGRESS NOTE  HPI/Subjective: Cont to complaing of back pain, he relates SOB slightly improved.  Assessment/Plan: Syncope and collapse - Likely vasovagal, no events telemetry. - Troponins are negative, echocardiogram done on November 2015 showed an ejection fraction of 55%. - Question of acute respiratory failure in the setting of COPD condition contribute to his syncopal episode.  Low back pain: - X-ray of the back showed a L1 compression fracture. - pain control, his pain actually improved - MRI pending, unable to get due to breathing difficulties and back pain   Essential  Hypertension - Continue home medications.  Acute COPD (chronic obstructive pulmonary disease) exacerbation: - continue IV steroids, inhalers and antibiotics. - there is a degree of vocal cord dysfunction. - CT showed no PE. - still with significant dyspnea, consulted Pulmonary, appreciate input  Chronic atrial fibrillation - Currently rate control with no events on telemetry. - He has not started his anticoagulation as it was held for recent left elbow hematoma. - Continue to hold.   Code Status: full Family Communication: d/w his son bedside  Disposition Plan: home in 1-2 days   Consultants:  none  Procedures:  MRI spine  Lumbar spine x-ray  Antibiotics:  None   Objective: Filed Vitals:   08/27/14 1114 08/27/14 1141 08/27/14 1314 08/27/14 1409  BP:   139/80   Pulse: 76 76 72   Temp:   98.1 F (36.7 C)   TempSrc:   Oral   Resp: 18 18 19    Height:      Weight:      SpO2:   97% 98%    Intake/Output Summary (Last 24 hours) at 08/27/14 1441 Last data filed at 08/27/14 1048  Gross per 24 hour  Intake    486 ml  Output    150 ml  Net    336 ml   Filed Weights   08/25/14 0500 08/26/14 0343 08/27/14 0432  Weight: 81.1 kg (178 lb 12.7 oz) 64.456 kg (142 lb 1.6 oz) 71.85 kg (158 lb 6.4 oz)    Exam: General: Alert, awake, oriented x3, in no acute  distress HEENT: No bruits, no goiter.  Heart: Regular rate and rhythm, no MRG, no JVD, no edema  Lungs: good air movement with upper resp tract wheezing, no crackles. Abdomen: Soft, nontender, nondistended, positive bowel sounds.  Neuro: Grossly intact, nonfocal. Psych: normal mood and affect   Data Reviewed: Basic Metabolic Panel:  Recent Labs Lab 08/23/14 0205 08/23/14 0810 08/24/14 0501 08/26/14 0404 08/27/14 0415  NA 137 135 135 137 137  K 3.3* 3.9 4.4 4.2 4.5  CL 101 100* 99* 101 99*  CO2 24 25 30 29 28   GLUCOSE 135* 136* 184* 152* 217*  BUN 10 10 11  21* 24*  CREATININE 0.89 0.85 0.84 0.89 1.04  CALCIUM 8.5* 8.1* 8.7* 8.6* 8.7*   Liver Function Tests:  Recent Labs Lab 08/23/14 0810  AST 22  ALT 15*  ALKPHOS 48  BILITOT 0.8  PROT 6.0*  ALBUMIN 3.3*   CBC:  Recent Labs Lab 08/23/14 0205 08/23/14 0810 08/24/14 0501  WBC 11.0* 9.3 7.1  NEUTROABS 7.8* 7.5  --   HGB 13.5 12.5* 13.1  HCT 41.6 39.3 41.0  MCV 99.3 99.5 99.5  PLT 191 189 175   Cardiac Enzymes:  Recent Labs Lab 08/23/14 0205 08/23/14 0810  CKTOTAL  --  95  TROPONINI <0.03 <0.03   BNP (last 3 results)  Recent Labs  05/10/14 0719 05/13/14 9983  BNP 190.0* 309.4*    ProBNP (last 3 results)  Recent Labs  03/17/14 1032  PROBNP 142.0*    CBG:  Recent Labs Lab 08/26/14 1136 08/26/14 1606 08/26/14 2117 08/27/14 0603 08/27/14 1120  GLUCAP 208* 145* 221* 195* 238*    Recent Results (from the past 240 hour(s))  Rapid strep screen (not at [facility])     Status: None   Collection Time: 08/27/14 10:59 AM  Result Value Ref Range Status   Streptococcus, Group A Screen (Direct) NEGATIVE NEGATIVE Final    Comment: (NOTE) A Rapid Antigen test may result negative if the antigen level in the sample is below the detection level of this test. The FDA has not cleared this test as a stand-alone test therefore the rapid antigen negative result has reflexed to a Group A Strep culture.        Studies: No results found.  Scheduled Meds: . amiodarone  200 mg Oral Daily  . aspirin EC  81 mg Oral Daily  . budesonide (PULMICORT) nebulizer solution  0.5 mg Nebulization BID  . calcium-vitamin D  1 tablet Oral Q breakfast  . diltiazem  180 mg Oral Daily  . doxycycline (VIBRAMYCIN) IV  100 mg Intravenous Q12H  . enoxaparin (LOVENOX) injection  40 mg Subcutaneous Q24H  . hydrocortisone   Rectal BID  . insulin aspart  0-15 Units Subcutaneous TID WC  . insulin aspart  0-5 Units Subcutaneous QHS  . insulin aspart  3 Units Subcutaneous TID WC  . insulin detemir  20 Units Subcutaneous Daily  . ipratropium  0.5 mg Nebulization Q6H  . levalbuterol  0.63 mg Nebulization Q6H  . loratadine  10 mg Oral Daily  . methylPREDNISolone (SOLU-MEDROL) injection  60 mg Intravenous 3 times per day  . pantoprazole  40 mg Oral BID  . polyethylene glycol  17 g Oral Daily  . potassium chloride SA  20 mEq Oral Daily  . rosuvastatin  10 mg Oral Daily  . sodium chloride  3 mL Intravenous Q12H  . cyanocobalamin  2,000 mcg Oral Daily   Continuous Infusions: . sodium chloride 75 mL/hr at 08/27/14 0300    Time Spent:25 min   Marzetta Board  [facility] Pager [phone]. If 7PM-7AM, please contact night-coverage at [email or web address], password [facility] 08/27/2014, 2:41 PM  LOS: 4 days

## 2014-08-28 ENCOUNTER — Inpatient Hospital Stay (HOSPITAL_COMMUNITY): Payer: Medicare Other

## 2014-08-28 DIAGNOSIS — I482 Chronic atrial fibrillation: Secondary | ICD-10-CM

## 2014-08-28 DIAGNOSIS — S32010D Wedge compression fracture of first lumbar vertebra, subsequent encounter for fracture with routine healing: Secondary | ICD-10-CM

## 2014-08-28 DIAGNOSIS — S32010A Wedge compression fracture of first lumbar vertebra, initial encounter for closed fracture: Secondary | ICD-10-CM | POA: Insufficient documentation

## 2014-08-28 DIAGNOSIS — J441 Chronic obstructive pulmonary disease with (acute) exacerbation: Secondary | ICD-10-CM

## 2014-08-28 DIAGNOSIS — J9622 Acute and chronic respiratory failure with hypercapnia: Secondary | ICD-10-CM

## 2014-08-28 LAB — GLUCOSE, CAPILLARY
Glucose-Capillary: 145 mg/dL — ABNORMAL HIGH (ref 65–99)
Glucose-Capillary: 197 mg/dL — ABNORMAL HIGH (ref 65–99)
Glucose-Capillary: 64 mg/dL — ABNORMAL LOW (ref 65–99)
Glucose-Capillary: 76 mg/dL (ref 65–99)
Glucose-Capillary: 180 mg/dL — ABNORMAL HIGH (ref 65–99)

## 2014-08-28 LAB — BASIC METABOLIC PANEL
Anion gap: 9 (ref 5–15)
BUN: 25 mg/dL — ABNORMAL HIGH (ref 6–20)
CALCIUM: 8.6 mg/dL — AB (ref 8.9–10.3)
CO2: 28 mmol/L (ref 22–32)
Chloride: 100 mmol/L — ABNORMAL LOW (ref 101–111)
Creatinine, Ser: 0.88 mg/dL (ref 0.61–1.24)
GLUCOSE: 155 mg/dL — AB (ref 65–99)
POTASSIUM: 4.3 mmol/L (ref 3.5–5.1)
SODIUM: 137 mmol/L (ref 135–145)

## 2014-08-28 MED ORDER — MORPHINE SULFATE (CONCENTRATE) 10 MG/0.5ML PO SOLN
10.0000 mg | ORAL | Status: DC | PRN
  Administered 2014-08-28 – 2014-08-29 (×5): 10 mg via ORAL
  Filled 2014-08-28 (×5): qty 0.5

## 2014-08-28 MED ORDER — PHENOL 1.4 % MT LIQD
1.0000 | OROMUCOSAL | Status: DC | PRN
  Filled 2014-08-28: qty 177

## 2014-08-28 MED ORDER — DOXYCYCLINE HYCLATE 100 MG PO TABS
100.0000 mg | ORAL_TABLET | Freq: Two times a day (BID) | ORAL | Status: DC
  Administered 2014-08-28 – 2014-08-29 (×3): 100 mg via ORAL
  Filled 2014-08-28 (×4): qty 1

## 2014-08-28 NOTE — Progress Notes (Signed)
Pt bs 64 patient requested ice cream . Will recheck sugar in 15 minutes.  [name], Mervin Kung RN

## 2014-08-28 NOTE — Care Management Note (Signed)
Case Management Note  Patient Details  Name: [name] MRN: [id number] Date of Birth: 01/18/1941  Subjective/Objective:      Pt admitted with increased dyspnea and back pain              Action/Plan:  SW has been consulted for SNF placement   Expected Discharge Date:                  Expected Discharge Plan:  Skilled Nursing Facility  In-House Referral:  Clinical Social Work  Discharge planning Services  CM Consult  Post Acute Care Choice:    Choice offered to:     DME Arranged:    DME Agency:     HH Arranged:    [place] Agency:     Status of Service:     Medicare Important Message Given:  Yes Date Medicare IM Given:  08/28/14 Medicare IM give by:  Elenor Quinones Date Additional Medicare IM Given:    Additional Medicare Important Message give by:     If discussed at [place] of Stay Meetings, dates discussed:  08/28/15  Additional Comments:  Maryclare Labrador, RN 08/28/2014, 3:51 PM

## 2014-08-28 NOTE — Progress Notes (Signed)
Physical Therapy Treatment Patient Details Name: [name] MRN: [id number] DOB: [birth year] Today's Date: 08/28/2014    History of Present Illness HPI: [name] is a 74 y.o. male history of CAD status post CABG, chronic atrial fibrillation, COPD, diastolic CHF, hypertension presents to the ER after patient had a brief episode of loss of consciousness last night. Patient states he was walking from the den to his room and he sneezed and next thing he remembers was he was lying on the floor.     PT Comments    Pt is progressing slowly towards physical therapy goals. Was not able to tolerate gait training this session due to increased back pain and difficulty breathing. O2 sats were 99% after breathing treatment, however pt wheezing and very dyspneic at rest. Focus of session was repositioning from bed to chair for pain relief. Will continue to follow and progress as able per POC.   Follow Up Recommendations  SNF     Equipment Recommendations  Rolling walker with 5" wheels;3in1 (PT)    Recommendations for Other Services       Precautions / Restrictions Precautions Precautions: Back;Fall Precaution Comments: Back precautions for comfort Restrictions Weight Bearing Restrictions: No    Mobility  Bed Mobility               General bed mobility comments: Pt sitting on EOB with respiratory therapy when PT arrived.   Transfers Overall transfer level: Needs assistance Equipment used: Rolling walker (2 wheeled) Transfers: Sit to/from [facility] Sit to Stand: Min assist Stand pivot transfers: Min assist       General transfer comment: VC's for hand placement on seated surface for safety. Pt required assist for balance as he powered-up to full standing and took a few pivotal steps around to the chair.   Ambulation/Gait             General Gait Details: Pt was unable to tolerate gait training this session. Was on a breathing treatment with respiratory  therapy when PT entered. Pt was having signficant back pain and wanted to transfer to the recliner chair only.    Stairs            Wheelchair Mobility    Modified Rankin (Stroke Patients Only)       Balance Overall balance assessment: Needs assistance Sitting-balance support: Feet supported;No upper extremity supported Sitting balance-Leahy Scale: Fair     Standing balance support: Bilateral upper extremity supported Standing balance-Leahy Scale: Poor                      Cognition Arousal/Alertness: Awake/alert Behavior During Therapy: WFL for tasks assessed/performed Overall Cognitive Status: Within Functional Limits for tasks assessed                      Exercises      General Comments General comments (skin integrity, edema, etc.): Noted wheezing throughout session. Sats remained 99% after breathing treatment on 2L/min supplemental O2.      Pertinent Vitals/Pain Pain Assessment: 0-10 Pain Score: 7  Pain Location: Low back Pain Descriptors / Indicators: Aching Pain Intervention(s): Limited activity within patient's tolerance;Monitored during session;Repositioned    Home Living                      Prior Function            PT Goals (current goals can now be found in the care  plan section) Acute Rehab PT Goals Patient Stated Goal: Get back home PT Goal Formulation: With patient Time For Goal Achievement: 09/07/14 Potential to Achieve Goals: Good Progress towards PT goals: Not progressing toward goals - comment    Frequency  Min 3X/week    PT Plan Current plan remains appropriate    Co-evaluation             End of Session Equipment Utilized During Treatment: Gait belt;Oxygen Activity Tolerance: Patient limited by pain;Patient limited by fatigue Patient left: in chair;with chair alarm set;with call bell/phone within reach     Time: 0810-0832 PT Time Calculation (min) (ACUTE ONLY): 22 min  Charges:   $Therapeutic Activity: 8-22 mins                    G Codes:      Rolinda Roan 09/27/2014, 8:40 AM   Rolinda Roan, PT, DPT Acute Rehabilitation Services Pager: [phone]

## 2014-08-28 NOTE — Progress Notes (Signed)
Orthopedic Tech Progress Note Patient Details:  KOLTEN RYBACK Mar 09, 1941 [id number] Brace order completed by bio-tech vendor. Patient ID: CURVIN HUNGER, male   DOB: 10/16/1940, 74 y.o.   MRN: [id number]   Braulio Bosch 08/28/2014, 5:44 PM

## 2014-08-28 NOTE — Progress Notes (Signed)
Orthopedic Tech Progress Note Patient Details:  [name] May 24, 1940 [id number]  Patient ID: Jodi Marble, male   DOB: 1941/03/23, 74 y.o.   MRN: [id number] Called in bio-tech brace order; spoke with Ramonita Lab, Leena Tiede 08/28/2014, 12:47 PM

## 2014-08-28 NOTE — Progress Notes (Signed)
Results for JACQUELYN, ANTONY (MRN [id number]) as of 08/28/2014 09:48  Ref. Range 08/27/2014 11:20 08/27/2014 16:00 08/27/2014 20:53 08/27/2014 21:27 08/28/2014 06:39  Glucose-Capillary Latest Ref Range: 65-99 mg/dL 238 (H) 134 (H) 64 (L) 96 145 (H)  CBGs have been less than 100 mg/dl.  If CBGs continue to be low, recommend decreasing Novolog correction scale to SENSITIVE TID & HS. Will continue to follow while in hospital. Harvel Ricks RN BSN CDE

## 2014-08-28 NOTE — Progress Notes (Signed)
[facility] PROGRESS NOTE  HPI/Subjective: Cont to complaing of back pain, he relates SOB slightly improved.  Assessment/Plan: Syncope and collapse - Likely vasovagal, no events telemetry. - Troponins are negative, echocardiogram done on November 2015 showed an ejection fraction of 55%. - Question of acute respiratory failure in the setting of COPD condition contribute to his syncopal episode.  Low back pain due to L1 compression fracture - X-ray of the back showed a L1 compression fracture, MRI confirms - pain control, his pain actually improved - discussed with Dr. Arnoldo Morale from neurosurgery, would not recommend hyphoplasty as high respiratory risk, recommended brace, symptomatic treatment and outpatient follow up.  Essential  Hypertension - Continue home medications.  Acute COPD (chronic obstructive pulmonary disease) exacerbation: - continue IV steroids, inhalers and antibiotics. - there is a degree of vocal cord dysfunction. - CT showed no PE. - still with significant dyspnea, consulted Pulmonary, appreciate input  Chronic atrial fibrillation - Currently rate control with no events on telemetry. - He has not started his anticoagulation as it was held for recent left elbow hematoma. - Continue to hold.   Code Status: full Family Communication: d/w son bedside  Disposition Plan: SNF in 1-2 days   Consultants:  Pulmonary   Procedures:  MRI spine  Lumbar spine x-ray  Antibiotics:  None   Objective: Filed Vitals:   08/27/14 2027 08/28/14 0449 08/28/14 0810 08/28/14 1103  BP: 151/82 141/85  134/64  Pulse: 74 73 100 80  Temp: 98.6 F (37 C) 97.8 F (36.6 C)    TempSrc: Oral Oral    Resp: 18 20 24 20   Height:      Weight:  86.138 kg (189 lb 14.4 oz)    SpO2: 98% 99%      Intake/Output Summary (Last 24 hours) at 08/28/14 1341 Last data filed at 08/28/14 0810  Gross per 24 hour  Intake    120 ml  Output    775 ml  Net   -655 ml   Filed  Weights   08/26/14 0343 08/27/14 0432 08/28/14 0449  Weight: 64.456 kg (142 lb 1.6 oz) 71.85 kg (158 lb 6.4 oz) 86.138 kg (189 lb 14.4 oz)   Exam: General: Alert, awake, oriented x3, in no acute distress HEENT: No bruits, no goiter.  Heart: Regular rate and rhythm, no MRG, no JVD, no edema  Lungs: good air movement with upper resp tract wheezing, no crackles. Abdomen: Soft, nontender, nondistended, positive bowel sounds.  Neuro: Grossly intact, nonfocal. Psych: normal mood and affect  Data Reviewed: Basic Metabolic Panel:  Recent Labs Lab 08/23/14 0810 08/24/14 0501 08/26/14 0404 08/27/14 0415 08/28/14 0510  NA 135 135 137 137 137  K 3.9 4.4 4.2 4.5 4.3  CL 100* 99* 101 99* 100*  CO2 25 30 29 28 28   GLUCOSE 136* 184* 152* 217* 155*  BUN 10 11 21* 24* 25*  CREATININE 0.85 0.84 0.89 1.04 0.88  CALCIUM 8.1* 8.7* 8.6* 8.7* 8.6*   Liver Function Tests:  Recent Labs Lab 08/23/14 0810  AST 22  ALT 15*  ALKPHOS 48  BILITOT 0.8  PROT 6.0*  ALBUMIN 3.3*   CBC:  Recent Labs Lab 08/23/14 0205 08/23/14 0810 08/24/14 0501  WBC 11.0* 9.3 7.1  NEUTROABS 7.8* 7.5  --   HGB 13.5 12.5* 13.1  HCT 41.6 39.3 41.0  MCV 99.3 99.5 99.5  PLT 191 189 175   Cardiac Enzymes:  Recent Labs Lab 08/23/14 0205 08/23/14 0810  CKTOTAL  --  95  TROPONINI <0.03 <0.03   BNP (last 3 results)  Recent Labs  05/10/14 0719 05/13/14 0317  BNP 190.0* 309.4*    ProBNP (last 3 results)  Recent Labs  03/17/14 1032  PROBNP 142.0*    CBG:  Recent Labs Lab 08/27/14 1600 08/27/14 2053 08/27/14 2127 08/28/14 0639 08/28/14 1129  GLUCAP 134* 64* 96 145* 197*    Recent Results (from the past 240 hour(s))  Rapid strep screen (not at [facility])     Status: None   Collection Time: 08/27/14 10:59 AM  Result Value Ref Range Status   Streptococcus, Group A Screen (Direct) NEGATIVE NEGATIVE Final    Comment: (NOTE) A Rapid Antigen test may result negative if the antigen level in  the sample is below the detection level of this test. The FDA has not cleared this test as a stand-alone test therefore the rapid antigen negative result has reflexed to a Group A Strep culture.      Studies: Mr Lumbar Spine Wo Contrast  08/27/2014   CLINICAL DATA:  Low back pain. Pain started suddenly after sneezing in falling on the floor.  EXAM: MRI LUMBAR SPINE WITHOUT CONTRAST  TECHNIQUE: Multiplanar, multisequence MR imaging of the lumbar spine was performed. No intravenous contrast was administered.  COMPARISON:  Lumbar spine radiographs 08/23/2014.  FINDINGS: The scratch the normal signal is present within the conus medullaris which terminates at L1. The L1 compression fracture is confirmed to be acute. 40% loss of height is noted anteriorly. There is no significant retropulsion of bone.  Bilateral L5 pars defects are present. Anterolisthesis scratch the grade 2 anterolisthesis at L5-S1 measures 15 mm.  The cyst at the lower pole of the right kidney measures 5.3 cm. Smaller cysts are present in the right kidney is well. A single cyst near the upper pole of the left kidney measures 19 mm. Atherosclerotic changes are present in the aorta with borderline aneurysmal dilation measuring 3.1 cm maximally. There is no significant adenopathy.  T12-L1:  Negative.  L1-2: No significant disc protrusion or stenosis. Mild bulging is present.  L2-3:  Negative.  L3-4: A mild lateral disc protrusions are present bilaterally without significant stenosis.  L4-5: A broad-based disc protrusion is present. Moderate facet hypertrophy is evident. The central canal is patent. Mild foraminal narrowing is present on the right.  L5-S1: Grade 2 anterolisthesis is present. Advanced facet hypertrophy is noted. The central canal is spared. Severe right and moderate left foraminal stenosis is present.  IMPRESSION: 1. Acute anterior compression fracture at L1 with 40% loss of height but no significant retropulsion of bone. 2.  Bilateral L5 pars defects and grade 2 anterolisthesis at L5-S1 with severe right and moderate left foraminal stenosis. 3. Mild right foraminal stenosis at L4-5. 4. Bilateral renal cystic disease. 5. Borderline aneurysmal dilation of the distal abdominal aorta measuring up to 3.1 cm.   Electronically Signed   By: San Morelle M.D.   On: 08/27/2014 19:03   Dg Chest Portable 1 View  08/28/2014   CLINICAL DATA:  Shortness of Breath  EXAM: PORTABLE CHEST - 1 VIEW  COMPARISON:  May 13, 2014 chest radiograph and Aug 25, 2014 chest CT  FINDINGS: There is underlying emphysematous change. There is a small right pleural effusion. Interstitium is prominent, consistent with a degree of underlying fibrosis. There is no frank edema or consolidation. Heart is borderline enlarged with pulmonary vascularity within normal limits. No adenopathy. Postoperative change is noted. Patient is status post median sternotomy. There  is an old healed fracture of the left postero lateral seventh rib.  IMPRESSION: Underlying emphysematous change and interstitial fibrosis. Small right effusion. No frank edema or consolidation. No change in cardiac silhouette.   Electronically Signed   By: Lowella Grip III M.D.   On: 08/28/2014 10:52    Scheduled Meds: . amiodarone  200 mg Oral Daily  . aspirin EC  81 mg Oral Daily  . budesonide (PULMICORT) nebulizer solution  0.5 mg Nebulization BID  . calcium-vitamin D  1 tablet Oral Q breakfast  . diltiazem  180 mg Oral Daily  . doxycycline  100 mg Oral Q12H  . enoxaparin (LOVENOX) injection  40 mg Subcutaneous Q24H  . hydrocortisone   Rectal BID  . insulin aspart  0-15 Units Subcutaneous TID WC  . insulin aspart  0-5 Units Subcutaneous QHS  . insulin aspart  3 Units Subcutaneous TID WC  . insulin detemir  20 Units Subcutaneous Daily  . ipratropium  0.5 mg Nebulization Q6H  . levalbuterol  0.63 mg Nebulization Q6H  . loratadine  10 mg Oral Daily  . methylPREDNISolone  (SOLU-MEDROL) injection  60 mg Intravenous 3 times per day  . pantoprazole  40 mg Oral BID  . polyethylene glycol  17 g Oral Daily  . potassium chloride SA  20 mEq Oral Daily  . rosuvastatin  10 mg Oral Daily  . sodium chloride  3 mL Intravenous Q12H  . cyanocobalamin  2,000 mcg Oral Daily   Continuous Infusions: . sodium chloride 75 mL/hr (08/28/14 1230)    Time Spent: 25 min   Marzetta Board  [facility] Pager [phone]. If 7PM-7AM, please contact night-coverage at [email or web address], password [facility] 08/28/2014, 1:41 PM  LOS: 5 days

## 2014-08-28 NOTE — Progress Notes (Signed)
UR Completed. Kahli Mayon, RN, BSN.  [phone] 

## 2014-08-28 NOTE — Progress Notes (Signed)
Name: [name] MRN: [id number] DOB: [birth year]    ADMISSION DATE:  08/23/2014 CONSULTATION DATE:  08/27/14  REFERRING MD :  Cruzita Lederer   CHIEF COMPLAINT:  AECOPD   BRIEF PATIENT DESCRIPTION:  74yo male with hx HTN, CAD, Afib, DM, Gold III COPD, dCHF admitted 5/28 with syncope thought secondary to vasovagal event with essentially negative workup.  Has had progressive SOB and AECOPD, PCCM consulted 6/1.   SIGNIFICANT EVENTS    STUDIES:  CT head 5/30>>> negative CTA chest 5/30>>> neg PE, severe COPD MRI spine 6/1>>> acute compression fx L1    SUBJECTIVE:  Feeling a little better but remains very SOB.  Extremely limited activity tolerance.  "could barely get to the chair" this am r/t SOB. C/o back pain.   VITAL SIGNS: Temp:  [97.8 F (36.6 C)-98.6 F (37 C)] 97.8 F (36.6 C) (06/02 0449) Pulse Rate:  [72-100] 100 (06/02 0810) Resp:  [18-24] 24 (06/02 0810) BP: (139-157)/(80-87) 141/85 mmHg (06/02 0449) SpO2:  [97 %-99 %] 99 % (06/02 0449) Weight:  [189 lb 14.4 oz (86.138 kg)] 189 lb 14.4 oz (86.138 kg) (06/02 0449)  PHYSICAL EXAMINATION: General:  Pleasant chronically ill appearing male, mild distress   Neuro:  Awake, alert, appropriate, MAE  HEENT:  Mm dry, no JVD  Cardiovascular:  s1s2 irreg  Lungs:  resps even, mildly labored, diffuse exp wheeze, only slightly improved with pursed lip Abdomen:  Soft, +bs  Musculoskeletal:  Warm and dry, trace edema     Recent Labs Lab 08/26/14 0404 08/27/14 0415 08/28/14 0510  NA 137 137 137  K 4.2 4.5 4.3  CL 101 99* 100*  CO2 29 28 28   BUN 21* 24* 25*  CREATININE 0.89 1.04 0.88  GLUCOSE 152* 217* 155*    Recent Labs Lab 08/23/14 0205 08/23/14 0810 08/24/14 0501  HGB 13.5 12.5* 13.1  HCT 41.6 39.3 41.0  WBC 11.0* 9.3 7.1  PLT 191 189 175   Mr Lumbar Spine Wo Contrast  08/27/2014   CLINICAL DATA:  Low back pain. Pain started suddenly after sneezing in falling on the floor.  EXAM: MRI LUMBAR SPINE WITHOUT CONTRAST   TECHNIQUE: Multiplanar, multisequence MR imaging of the lumbar spine was performed. No intravenous contrast was administered.  COMPARISON:  Lumbar spine radiographs 08/23/2014.  FINDINGS: The scratch the normal signal is present within the conus medullaris which terminates at L1. The L1 compression fracture is confirmed to be acute. 40% loss of height is noted anteriorly. There is no significant retropulsion of bone.  Bilateral L5 pars defects are present. Anterolisthesis scratch the grade 2 anterolisthesis at L5-S1 measures 15 mm.  The cyst at the lower pole of the right kidney measures 5.3 cm. Smaller cysts are present in the right kidney is well. A single cyst near the upper pole of the left kidney measures 19 mm. Atherosclerotic changes are present in the aorta with borderline aneurysmal dilation measuring 3.1 cm maximally. There is no significant adenopathy.  T12-L1:  Negative.  L1-2: No significant disc protrusion or stenosis. Mild bulging is present.  L2-3:  Negative.  L3-4: A mild lateral disc protrusions are present bilaterally without significant stenosis.  L4-5: A broad-based disc protrusion is present. Moderate facet hypertrophy is evident. The central canal is patent. Mild foraminal narrowing is present on the right.  L5-S1: Grade 2 anterolisthesis is present. Advanced facet hypertrophy is noted. The central canal is spared. Severe right and moderate left foraminal stenosis is present.  IMPRESSION: 1. Acute anterior  compression fracture at L1 with 40% loss of height but no significant retropulsion of bone. 2. Bilateral L5 pars defects and grade 2 anterolisthesis at L5-S1 with severe right and moderate left foraminal stenosis. 3. Mild right foraminal stenosis at L4-5. 4. Bilateral renal cystic disease. 5. Borderline aneurysmal dilation of the distal abdominal aorta measuring up to 3.1 cm.   Electronically Signed   By: San Morelle M.D.   On: 08/27/2014 19:03    ASSESSMENT / PLAN:  AECOPD --  PFT's 02/17/12 FEV1 1.20 (49%)  REC -  Continue supplemental O2  Will need ambulatory desat prior to d/c - suspect he needs home O2  Continue solumedrol - no taper for now as remains significantly bronchospastic  Continue doxycycline -  change to PO  Continue budesonide, xopenex, atrovent  Pulmonary hygiene  DNR/DNI BID PPI  CXR now   Compression fx L1  Per primary    Discussed at length with pt and son at bedside.  Despite IV steroids, nebulized BD's, supplemental O2, abx and HR control pt remains extremely SOB.  His COPD is near end-stage at baseline and he has previously made it clear that he would not want to be intubated under any circumstances.  Will continue to treat AECOPD aggressively, check CXR for completeness, but have also added PRN roxanol for symptom management after discussions with pt/son.  He is for d/c to SNF when medically ready and may benefit from hospice.  Will ask palliative care to see and assist with symptom management as well as hospice eligibility.     Nickolas Madrid, NP 08/28/2014  9:47 AM Pager: [phone] or [phone]  PCCM ATTENDING: I have reviewed pt's initial presentation, consultants notes and hospital database in detail.  The above assessment and plan was formulated under my direction.  Continues to wheeze CXR reveals chronic changes with NAD  In summary: ES COPD  Syncope with transient LOC CAF  As above, cont maximum COPD treatment Would cont methylpred for total of 3 days, then pred 60 mg daily X 3, 40 mg daily X 3, 20 mg daily X 3, stop DNR/DNI is appropriate Palliative Care consultation has been requested PCCM will sign off. Please call if we can be of further assistance   Merton Border, MD;  PCCM service; Mobile [phone]

## 2014-08-29 DIAGNOSIS — S32010D Wedge compression fracture of first lumbar vertebra, subsequent encounter for fracture with routine healing: Secondary | ICD-10-CM | POA: Diagnosis not present

## 2014-08-29 DIAGNOSIS — E119 Type 2 diabetes mellitus without complications: Secondary | ICD-10-CM | POA: Diagnosis not present

## 2014-08-29 DIAGNOSIS — F0391 Unspecified dementia with behavioral disturbance: Secondary | ICD-10-CM | POA: Diagnosis not present

## 2014-08-29 DIAGNOSIS — Z9181 History of falling: Secondary | ICD-10-CM | POA: Diagnosis not present

## 2014-08-29 DIAGNOSIS — D500 Iron deficiency anemia secondary to blood loss (chronic): Secondary | ICD-10-CM | POA: Diagnosis not present

## 2014-08-29 DIAGNOSIS — R109 Unspecified abdominal pain: Secondary | ICD-10-CM | POA: Diagnosis not present

## 2014-08-29 DIAGNOSIS — Z9981 Dependence on supplemental oxygen: Secondary | ICD-10-CM | POA: Diagnosis not present

## 2014-08-29 DIAGNOSIS — S32009A Unspecified fracture of unspecified lumbar vertebra, initial encounter for closed fracture: Secondary | ICD-10-CM | POA: Diagnosis not present

## 2014-08-29 DIAGNOSIS — I4891 Unspecified atrial fibrillation: Secondary | ICD-10-CM | POA: Diagnosis not present

## 2014-08-29 DIAGNOSIS — I5032 Chronic diastolic (congestive) heart failure: Secondary | ICD-10-CM | POA: Diagnosis not present

## 2014-08-29 DIAGNOSIS — M549 Dorsalgia, unspecified: Secondary | ICD-10-CM | POA: Diagnosis not present

## 2014-08-29 DIAGNOSIS — Z951 Presence of aortocoronary bypass graft: Secondary | ICD-10-CM | POA: Diagnosis not present

## 2014-08-29 DIAGNOSIS — K625 Hemorrhage of anus and rectum: Secondary | ICD-10-CM | POA: Diagnosis not present

## 2014-08-29 DIAGNOSIS — I5030 Unspecified diastolic (congestive) heart failure: Secondary | ICD-10-CM | POA: Diagnosis not present

## 2014-08-29 DIAGNOSIS — I5033 Acute on chronic diastolic (congestive) heart failure: Secondary | ICD-10-CM | POA: Diagnosis not present

## 2014-08-29 DIAGNOSIS — M545 Low back pain: Secondary | ICD-10-CM | POA: Diagnosis not present

## 2014-08-29 DIAGNOSIS — J449 Chronic obstructive pulmonary disease, unspecified: Secondary | ICD-10-CM | POA: Diagnosis not present

## 2014-08-29 DIAGNOSIS — R55 Syncope and collapse: Secondary | ICD-10-CM | POA: Diagnosis not present

## 2014-08-29 DIAGNOSIS — I482 Chronic atrial fibrillation: Secondary | ICD-10-CM | POA: Diagnosis not present

## 2014-08-29 DIAGNOSIS — Z7901 Long term (current) use of anticoagulants: Secondary | ICD-10-CM | POA: Diagnosis not present

## 2014-08-29 DIAGNOSIS — R030 Elevated blood-pressure reading, without diagnosis of hypertension: Secondary | ICD-10-CM | POA: Diagnosis not present

## 2014-08-29 DIAGNOSIS — I10 Essential (primary) hypertension: Secondary | ICD-10-CM | POA: Diagnosis not present

## 2014-08-29 DIAGNOSIS — I481 Persistent atrial fibrillation: Secondary | ICD-10-CM | POA: Diagnosis not present

## 2014-08-29 DIAGNOSIS — I2510 Atherosclerotic heart disease of native coronary artery without angina pectoris: Secondary | ICD-10-CM | POA: Diagnosis not present

## 2014-08-29 DIAGNOSIS — Z794 Long term (current) use of insulin: Secondary | ICD-10-CM | POA: Diagnosis not present

## 2014-08-29 DIAGNOSIS — J438 Other emphysema: Secondary | ICD-10-CM | POA: Diagnosis not present

## 2014-08-29 LAB — BASIC METABOLIC PANEL
Anion gap: 7 (ref 5–15)
BUN: 23 mg/dL — AB (ref 6–20)
CALCIUM: 8.6 mg/dL — AB (ref 8.9–10.3)
CHLORIDE: 101 mmol/L (ref 101–111)
CO2: 32 mmol/L (ref 22–32)
CREATININE: 0.88 mg/dL (ref 0.61–1.24)
GLUCOSE: 164 mg/dL — AB (ref 65–99)
Potassium: 4.4 mmol/L (ref 3.5–5.1)
Sodium: 140 mmol/L (ref 135–145)

## 2014-08-29 LAB — GLUCOSE, CAPILLARY
GLUCOSE-CAPILLARY: 211 mg/dL — AB (ref 65–99)
Glucose-Capillary: 161 mg/dL — ABNORMAL HIGH (ref 65–99)

## 2014-08-29 LAB — CULTURE, GROUP A STREP: Strep A Culture: NEGATIVE

## 2014-08-29 MED ORDER — PREDNISONE 20 MG PO TABS: 60.0000 mg | ORAL_TABLET | Freq: Every day | ORAL | Status: DC

## 2014-08-29 MED ORDER — MORPHINE SULFATE (CONCENTRATE) 10 MG/0.5ML PO SOLN: 10.0000 mg | Freq: Four times a day (QID) | ORAL | Status: DC | PRN

## 2014-08-29 MED ORDER — INSULIN DETEMIR 100 UNIT/ML ~~LOC~~ SOLN: 5.0000 [IU] | Freq: Every day | SUBCUTANEOUS | Status: DC

## 2014-08-29 MED ORDER — INSULIN ASPART 100 UNIT/ML ~~LOC~~ SOLN: 3.0000 [IU] | Freq: Three times a day (TID) | SUBCUTANEOUS | Status: DC

## 2014-08-29 MED ORDER — DOXYCYCLINE HYCLATE 100 MG PO TABS: 100.0000 mg | ORAL_TABLET | Freq: Two times a day (BID) | ORAL | Status: DC

## 2014-08-29 NOTE — Care Management (Signed)
Pt son provided [place] insurance card to [facility] : [facility].  CM faxed information to admitting.

## 2014-08-29 NOTE — Discharge Summary (Signed)
Physician Discharge Summary  [name] WGN:[id number] DOB: 06/21/1940 DOA: 08/23/2014  PCP: Garret Reddish, MD  Admit date: 08/23/2014 Discharge date: 08/29/2014  Time spent: > 30 minutes  Recommendations for Outpatient Follow-up:  1. Follow up with Dr. Yong Channel in 2 weeks 2. Follow up with Dr. Arnoldo Morale in 2-3 weeks 3. Follow up with Dr. Burt Knack in 1 month  4. Continue Doxycycline for 2 more days 5. Continue Prednisone taper 60 mg daily for 3 days then 40 mg daily for 3 days then 20 mg daily for 3 days then stop 6. Resume Pradaxa 7. Started Lantus / Novolog while on steroids, wean off as steroid dose will decrease over the next week   Discharge Diagnoses:  Active Problems:   Syncope   Low back pain   Hypertension   COPD (chronic obstructive pulmonary disease)   Chronic atrial fibrillation   Syncope and collapse   Compression fracture of L1 lumbar vertebra  Discharge Condition: stable  Diet recommendation: heart healthy  Filed Weights   08/27/14 0432 08/28/14 0449 08/29/14 0531  Weight: 71.85 kg (158 lb 6.4 oz) 86.138 kg (189 lb 14.4 oz) 74.4 kg (164 lb 0.4 oz)   History of present illness:  [name] is a 74 y.o. male history of CAD status post CABG, chronic atrial fibrillation, COPD, diastolic CHF, hypertension presents to the ER after patient had a brief episode of loss of consciousness last night. Patient states he was walking from the den to his room and he sneezed and next thing he remembers was he was lying on the floor. He thinks he may have lost consciousness for around less than 5 minutes. Denies any incontinence of urine bowels or any tongue bites. Denies any chest pain and patient has chronic shortness of breath from COPD. He was brought to the ER and CT head and x-rays were done. CT head was negative for anything acute. X-rays show possible lumbar fracture age indeterminant. Patient is still having severe pain on moving his low back. Patient otherwise denies any  chest pain. Denies any palpitations. Patient has had recent left elbow hematoma and has been off his Pradaxa. Patient was instructed to restart his Pradaxa. by his cardiologist last week but patient has not yet started.  Hospital Course:  Syncope and collapse - Likely vasovagal, no events telemetry. - Troponins are negative, echocardiogram done on November 2015 showed an ejection fraction of 55%. - most likely his acute on chronic respiratory failure in the setting of COPD condition contributed to his syncopal episode. Low back pain due to L1 compression fracture - X-ray of the back showed a L1 compression fracture, MRI confirms - pain control, his pain actually improved - discussed with Dr. Arnoldo Morale from neurosurgery, would not recommend hyphoplasty as high respiratory risk, recommended brace, symptomatic treatment and outpatient follow up. His pain significantly improved with brace and po Morphine Essential Hypertension - Continue home medications. COPD exacerbation: - initially on IV steroids, inhalers and antibiotics, improving, he was transitioned to po prednisone which should be tapered over the next week as above - there is a degree of vocal cord dysfunction. - CT showed no PE. - Pulmonary followed patient while hospitalized.  Chronic atrial fibrillation - Currently rate control with no events on telemetry. - He has not started his anticoagulation as it was held for recent left elbow hematoma, based on discussions with patient and outpatient notes will resume his Pradaxa.   Procedures:  None    Consultations:  Pulmonary  Discharge Exam: Filed Vitals:   08/29/14 0508 08/29/14 0531 08/29/14 0656 08/29/14 0712  BP: 163/81  146/75   Pulse: 73  70   Temp: 97.6 F (36.4 C)     TempSrc: Oral     Resp: 24     Height:      Weight:  74.4 kg (164 lb 0.4 oz)    SpO2: 97%   97%   General: NAD Cardiovascular: RRR Respiratory: clear, audible upper airway wheezing,  chronic  Discharge Instructions     Medication List    TAKE these medications        amiodarone 200 MG tablet  Commonly known as:  PACERONE  TAKE 1 TABLET (200 MG TOTAL) BY MOUTH DAILY.     aspirin EC 81 MG tablet  Take 81 mg by mouth daily.     CALTRATE 600+D PLUS 600-400 MG-UNIT per tablet  Chew 1 tablet by mouth daily.     cyanocobalamin 2000 MCG tablet  Commonly known as:  [employer] VITAMIN B12  Take 1 tablet (2,000 mcg total) by mouth daily.     dabigatran 150 MG Caps capsule  Commonly known as:  PRADAXA  Take 1 capsule (150 mg total) by mouth 2 (two) times daily.     diltiazem 180 MG 24 hr capsule  Commonly known as:  CARDIZEM CD  Take 1 capsule (180 mg total) by mouth daily.     doxycycline 100 MG tablet  Commonly known as:  VIBRA-TABS  Take 1 tablet (100 mg total) by mouth every 12 (twelve) hours. For 2 more days     DULERA 100-5 MCG/ACT Aero  Generic drug:  mometasone-formoterol  INHALE 2 PUFFS INTO THE LUNGS TWICE A DAY     furosemide 40 MG tablet  Commonly known as:  LASIX  Take 1 tablet (40 mg total) by mouth daily.     insulin aspart 100 UNIT/ML injection  Commonly known as:  novoLOG  Inject 3 Units into the skin 3 (three) times daily with meals.     insulin detemir 100 UNIT/ML injection  Commonly known as:  LEVEMIR  Inject 0.05 mLs (5 Units total) into the skin daily.     morphine CONCENTRATE 10 MG/0.5ML Soln concentrated solution  Take 0.5 mLs (10 mg total) by mouth every 6 (six) hours as needed for moderate pain or shortness of breath.     pantoprazole 40 MG tablet  Commonly known as:  PROTONIX  TAKE 1 TABLET BY MOUTH TWICE A DAY     potassium chloride SA 20 MEQ tablet  Commonly known as:  K-DUR,KLOR-CON  Take 1 tablet (20 mEq total) by mouth daily.     predniSONE 20 MG tablet  Commonly known as:  DELTASONE  Take 3 tablets (60 mg total) by mouth daily with breakfast. 60 mg daily for 3 days then 40 mg daily for 3 days then 20 mg dailly for 3  days then stop     rosuvastatin 20 MG tablet  Commonly known as:  CRESTOR  Take 10 mg by mouth daily.     SPIRIVA HANDIHALER 18 MCG inhalation capsule  Generic drug:  tiotropium  INHALE CONTENTS OF 1 CAPSULE DAILY     triamcinolone 0.025 % ointment  Commonly known as:  KENALOG  APPLY 1 A SMALL AMOUNT TO AFFECTED AREA TWICE A DAY     XOPENEX HFA 45 MCG/ACT inhaler  Generic drug:  levalbuterol  INHALE 2 PUFFS INTO THE LUNGS EVERY 4 (FOUR) HOURS AS NEEDED  FOR WHEEZING.     ZYRTEC ALLERGY 10 MG tablet  Generic drug:  cetirizine  Take 1 tablet (10 mg total) by mouth every morning.           Follow-up Information    Follow up with Garret Reddish, MD. Schedule an appointment as soon as possible for a visit in 2 weeks.   Specialty:  Family Medicine   Contact information:   [place] [address] [phone]       Follow up with Ophelia Charter, MD. Schedule an appointment as soon as possible for a visit in 3 weeks.   Specialty:  Neurosurgery   Contact information:   [address] [place] [address] [place] [address] [phone]       Follow up with Sherren Mocha, MD In 1 month.   Specialty:  Cardiology   Contact information:   [address] [place] [address] [phone]       The results of significant diagnostics from this hospitalization (including imaging, microbiology, ancillary and laboratory) are listed below for reference.    Significant Diagnostic Studies: Dg Thoracic Spine W/swimmers  08/23/2014   CLINICAL DATA:  Fall tonight with upper and lower back pain.  EXAM: THORACIC SPINE - 2 VIEW + SWIMMERS  COMPARISON:  Chest CT 02/17/2010  FINDINGS: There is a compression fracture of T7 with advanced (greater than 75%) height loss, chronic. Superior endplate compression of T3 is also chronic based on previous CT. No new compression fracture noted. There is exaggerated thoracic kyphosis without subluxation. No focal  bone lesion or endplate erosion.  CABG and left thyroidectomy.  No acute soft tissue findings.  Small hiatal hernia.  IMPRESSION: 1. No acute findings. 2. Remote compression fractures of T3 and T7, with height loss advanced at T7.   Electronically Signed   By: Monte Fantasia M.D.   On: 08/23/2014 02:57   Dg Lumbar Spine Complete  08/23/2014   CLINICAL DATA:  Status post fall, with lower back pain. Initial encounter.  EXAM: LUMBAR SPINE - COMPLETE 4+ VIEW  COMPARISON:  Lumbar spine radiographs performed 04/15/2008  FINDINGS: There is compression deformity involving vertebral body L1, with approximately 25% loss of height. This is of indeterminate age, but may be chronic in nature, though new from 2010.  Vertebral bodies otherwise demonstrate normal height. There is stable grade 2 anterolisthesis of L5 on S1, with underlying facet disease.  The visualized bowel gas pattern is unremarkable in appearance; air and stool are noted within the colon. The sacroiliac joints are within normal limits. Clips are noted at the right upper quadrant. Diffuse vascular calcifications are seen, with a stent graft along the left common iliac artery.  IMPRESSION: 1. Compression deformity of vertebral body L1, with approximately 25% loss of height. This is new from 2010 and of indeterminate age, though it has a somewhat more chronic appearance. 2. Stable grade 2 anterolisthesis of L5 on S1, with associated degenerative change. 3. Diffuse vascular calcifications seen.   Electronically Signed   By: Garald Balding M.D.   On: 08/23/2014 02:57   Dg Elbow Complete Left  08/23/2014   CLINICAL DATA:  Loss of consciousness with fall. Left elbow pain. Initial encounter.  EXAM: LEFT ELBOW - COMPLETE 3+ VIEW  COMPARISON:  None.  FINDINGS: There is no evidence of fracture, dislocation, or joint effusion. Enthesopathic ossification about the medial epicondyle.  IMPRESSION: Negative.   Electronically Signed   By: Neva Seat.D.  On:  08/23/2014 02:59   Ct Head Wo Contrast  08/23/2014   CLINICAL DATA:  Fall.  Left head soreness.  Initial encounter.  EXAM: CT HEAD WITHOUT CONTRAST  TECHNIQUE: Contiguous axial images were obtained from the base of the skull through the vertex without intravenous contrast.  COMPARISON:  None.  FINDINGS: Skull and Sinuses:Negative for fracture or destructive process. The mastoids, middle ears, and imaged paranasal sinuses are clear.  Orbits: Right cataract resection.  No traumatic findings.  Brain: No evidence of acute infarction, hemorrhage, hydrocephalus, or mass lesion/mass effect. Generalized atrophy which is mild for age. Intracranial atherosclerosis.  IMPRESSION: No evidence of intracranial injury or fracture.   Electronically Signed   By: Monte Fantasia M.D.   On: 08/23/2014 03:10   Ct Angio Chest Pe W/cm &/or Wo Cm  08/25/2014   CLINICAL DATA:  Shortness of breath and COPD.  Wheezing.  EXAM: CT ANGIOGRAPHY CHEST WITH CONTRAST  TECHNIQUE: Multidetector CT imaging of the chest was performed using the standard protocol during bolus administration of intravenous contrast. Multiplanar CT image reconstructions and MIPs were obtained to evaluate the vascular anatomy.  CONTRAST:  35mL OMNIPAQUE IOHEXOL 350 MG/ML SOLN  COMPARISON:  02/17/2010  FINDINGS: Mediastinum: Previous median sternotomy and CABG procedure. Normal heart size. The trachea appears patent and is midline. Moderate hiatal hernia noted. No mediastinal or hilar adenopathy identified. No enlarged axillary or supraclavicular adenopathy. The main pulmonary artery is patent. No lobar or segmental pulmonary artery filling defects identified.  Lungs/Pleura: Small bilateral pleural effusions are identified with overlying compressive type atelectasis. Moderate to advanced changes of centrilobular and paraseptal emphysema noted. Diffuse bronchial wall thickening identified.  Upper Abdomen: The visualized portions of the liver and spleen appear normal. The  adrenal glands are unremarkable. Normal appearance of the visualized portions of the pancreas.  Musculoskeletal: Degenerative disc disease is noted within the thoracic spine. No aggressive lytic or sclerotic bone lesion. Compression fractures are identified at the T7 and L1 level. These are likely chronic.  Review of the MIP images confirms the above findings.  IMPRESSION: 1. No evidence for acute pulmonary embolus. 2. Small bilateral pleural effusions with overlying compressive type consolidation/atelectasis. 3. Diffuse bronchial wall thickening with emphysema, as above; imaging findings suggestive of underlying COPD. 4. Age indeterminate but likely chronic T7 and L1 compression deformities. 5. Previous CABG.   Electronically Signed   By: Kerby Moors M.D.   On: 08/25/2014 11:01   Mr Lumbar Spine Wo Contrast  08/27/2014   CLINICAL DATA:  Low back pain. Pain started suddenly after sneezing in falling on the floor.  EXAM: MRI LUMBAR SPINE WITHOUT CONTRAST  TECHNIQUE: Multiplanar, multisequence MR imaging of the lumbar spine was performed. No intravenous contrast was administered.  COMPARISON:  Lumbar spine radiographs 08/23/2014.  FINDINGS: The scratch the normal signal is present within the conus medullaris which terminates at L1. The L1 compression fracture is confirmed to be acute. 40% loss of height is noted anteriorly. There is no significant retropulsion of bone.  Bilateral L5 pars defects are present. Anterolisthesis scratch the grade 2 anterolisthesis at L5-S1 measures 15 mm.  The cyst at the lower pole of the right kidney measures 5.3 cm. Smaller cysts are present in the right kidney is well. A single cyst near the upper pole of the left kidney measures 19 mm. Atherosclerotic changes are present in the aorta with borderline aneurysmal dilation measuring 3.1 cm maximally. There is no significant adenopathy.  T12-L1:  Negative.  L1-2: No  significant disc protrusion or stenosis. Mild bulging is present.   L2-3:  Negative.  L3-4: A mild lateral disc protrusions are present bilaterally without significant stenosis.  L4-5: A broad-based disc protrusion is present. Moderate facet hypertrophy is evident. The central canal is patent. Mild foraminal narrowing is present on the right.  L5-S1: Grade 2 anterolisthesis is present. Advanced facet hypertrophy is noted. The central canal is spared. Severe right and moderate left foraminal stenosis is present.  IMPRESSION: 1. Acute anterior compression fracture at L1 with 40% loss of height but no significant retropulsion of bone. 2. Bilateral L5 pars defects and grade 2 anterolisthesis at L5-S1 with severe right and moderate left foraminal stenosis. 3. Mild right foraminal stenosis at L4-5. 4. Bilateral renal cystic disease. 5. Borderline aneurysmal dilation of the distal abdominal aorta measuring up to 3.1 cm.   Electronically Signed   By: San Morelle M.D.   On: 08/27/2014 19:03   Dg Chest Portable 1 View  08/28/2014   CLINICAL DATA:  Shortness of Breath  EXAM: PORTABLE CHEST - 1 VIEW  COMPARISON:  May 13, 2014 chest radiograph and Aug 25, 2014 chest CT  FINDINGS: There is underlying emphysematous change. There is a small right pleural effusion. Interstitium is prominent, consistent with a degree of underlying fibrosis. There is no frank edema or consolidation. Heart is borderline enlarged with pulmonary vascularity within normal limits. No adenopathy. Postoperative change is noted. Patient is status post median sternotomy. There is an old healed fracture of the left postero lateral seventh rib.  IMPRESSION: Underlying emphysematous change and interstitial fibrosis. Small right effusion. No frank edema or consolidation. No change in cardiac silhouette.   Electronically Signed   By: Lowella Grip III M.D.   On: 08/28/2014 10:52   Dg Hips Bilat With Pelvis 3-4 Views  08/23/2014   CLINICAL DATA:  Status post fall, with bilateral hip pain. Initial encounter.   EXAM: BILATERAL HIP (WITH PELVIS) 3-4 VIEWS  COMPARISON:  None.  FINDINGS: There is no evidence of fracture or dislocation. Both femoral heads are seated normally within their respective acetabula. The proximal femurs appear intact. No significant degenerative change is appreciated. The sacroiliac joints are unremarkable in appearance.  The visualized bowel gas pattern is grossly unremarkable in appearance. Scattered phleboliths are noted within the pelvis. Diffuse vascular calcifications are seen. A stent is noted along the left common iliac artery.  IMPRESSION: No evidence of fracture or dislocation.   Electronically Signed   By: Garald Balding M.D.   On: 08/23/2014 02:58    Microbiology: Recent Results (from the past 240 hour(s))  Rapid strep screen (not at [facility])     Status: None   Collection Time: 08/27/14 10:59 AM  Result Value Ref Range Status   Streptococcus, Group A Screen (Direct) NEGATIVE NEGATIVE Final    Comment: (NOTE) A Rapid Antigen test may result negative if the antigen level in the sample is below the detection level of this test. The FDA has not cleared this test as a stand-alone test therefore the rapid antigen negative result has reflexed to a Group A Strep culture.    Labs: Basic Metabolic Panel:  Recent Labs Lab 08/23/14 0205 08/23/14 0810 08/24/14 0501 08/26/14 0404 08/27/14 0415 08/28/14 0510 08/29/14 0620  NA 137 135 135 137 137 137 140  K 3.3* 3.9 4.4 4.2 4.5 4.3 4.4  CL 101 100* 99* 101 99* 100* 101  CO2 24 25 30 29 28 28  32  GLUCOSE 135* 136* 184*  152* 217* 155* 164*  BUN 10 10 11  21* 24* 25* 23*  CREATININE 0.89 0.85 0.84 0.89 1.04 0.88 0.88  CALCIUM 8.5* 8.1* 8.7* 8.6* 8.7* 8.6* 8.6*   Liver Function Tests:  Recent Labs Lab 08/23/14 0810  AST 22  ALT 15*  ALKPHOS 48  BILITOT 0.8  PROT 6.0*  ALBUMIN 3.3*   CBC:  Recent Labs Lab 08/23/14 0205 08/23/14 0810 08/24/14 0501  WBC 11.0* 9.3 7.1  NEUTROABS 7.8* 7.5  --   HGB 13.5 12.5*  13.1  HCT 41.6 39.3 41.0  MCV 99.3 99.5 99.5  PLT 191 189 175   Cardiac Enzymes:  Recent Labs Lab 08/23/14 0205 08/23/14 0810  CKTOTAL  --  95  TROPONINI <0.03 <0.03   BNP: BNP (last 3 results)  Recent Labs  05/10/14 0719 05/13/14 0317  BNP 190.0* 309.4*    ProBNP (last 3 results)  Recent Labs  03/17/14 1032  PROBNP 142.0*    CBG:  Recent Labs Lab 08/28/14 1129 08/28/14 1628 08/28/14 1812 08/28/14 2107 08/29/14 0610  GLUCAP 197* 64* 76 180* 161*    Signed:  GHERGHE, COSTIN  [facility] 08/29/2014, 10:16 AM

## 2014-08-29 NOTE — Progress Notes (Signed)
Patient will discharge to [facility] Anticipated discharge date: 08/29/14 Family notified: pt son at [employer] by [employer]- called at 11:25am  [place] signing off.  Domenica Reamer, [place] Social Worker [phone]

## 2014-08-29 NOTE — Clinical Social Work Placement (Signed)
   CLINICAL SOCIAL WORK PLACEMENT  NOTE  Date:  08/29/2014  Patient Details  Name: [name] MRN: [id number] Date of Birth: [birth year]  Clinical Social Work is seeking post-discharge placement for this patient at the [place] level of care (*CSW will initial, date and re-position this form in  chart as items are completed):  Yes   Patient/family provided with [facility] Work Department's list of facilities offering this level of care within the geographic area requested by the patient (or if unable, by the patient's family).  Yes   Patient/family informed of their freedom to choose among providers that offer the needed level of care, that participate in Medicare, Medicaid or managed care program needed by the patient, have an available bed and are willing to accept the patient.  Yes   Patient/family informed of [facility]'s ownership interest in [facility] and [facility], as well as of the fact that they are under no obligation to receive care at these facilities.  PASRR submitted to EDS on       PASRR number received on       Existing PASRR number confirmed on 08/25/14     FL2 transmitted to all facilities in geographic area requested by pt/family on 08/25/14     FL2 transmitted to all facilities within larger geographic area on       Patient informed that his/her managed care company has contracts with or will negotiate with certain facilities, including the following:        Yes   Patient/family informed of bed offers received.  Patient chooses bed at [facility]     Physician recommends and patient chooses bed at [facility]    Patient to be transferred to [facility] on 08/29/14.  Patient to be transferred to facility by ptar     Patient family notified on 08/29/14 of transfer.  Name of family member notified:  Eddie Dibbles     PHYSICIAN Please sign FL2     Additional Comment:     _______________________________________________ Cranford Mon, LCSW 08/29/2014, 11:27 AM

## 2014-08-29 NOTE — Care Management Note (Addendum)
Case Management Note  Patient Details  Name: [name] MRN: [id number] Date of Birth: 1940/08/16  Subjective/Objective:      Pt admitted with increased dyspnea and back pain              Action/Plan:  SW has been consulted for SNF placement   Expected Discharge Date:                  Expected Discharge Plan:  Skilled Nursing Facility  In-House Referral:  Clinical Social Work  Discharge planning Services  CM Consult  Post Acute Care Choice:    Choice offered to:     DME Arranged:    DME Agency:     HH Arranged:    [place] Agency:     Status of Service:     Medicare Important Message Given:  Yes Date Medicare IM Given:  08/28/14 Medicare IM give by:  Elenor Quinones Date Additional Medicare IM Given:   08/29/14 Additional Medicare Important Message give by:   Elenor Quinones  If discussed at Long Length of Stay Meetings, dates discussed:  08/28/15  Additional Comments: 08/29/14 Elenor Quinones, RN, BSN [phone] Pt will discharge to SNF today   Maryclare Labrador, RN 08/29/2014, 10:36 AM

## 2014-09-01 DIAGNOSIS — J449 Chronic obstructive pulmonary disease, unspecified: Secondary | ICD-10-CM | POA: Diagnosis not present

## 2014-09-01 DIAGNOSIS — I5033 Acute on chronic diastolic (congestive) heart failure: Secondary | ICD-10-CM | POA: Diagnosis not present

## 2014-09-01 DIAGNOSIS — I4891 Unspecified atrial fibrillation: Secondary | ICD-10-CM | POA: Diagnosis not present

## 2014-09-01 DIAGNOSIS — I2510 Atherosclerotic heart disease of native coronary artery without angina pectoris: Secondary | ICD-10-CM | POA: Diagnosis not present

## 2014-09-03 DIAGNOSIS — Z9981 Dependence on supplemental oxygen: Secondary | ICD-10-CM | POA: Diagnosis not present

## 2014-09-03 DIAGNOSIS — I4891 Unspecified atrial fibrillation: Secondary | ICD-10-CM | POA: Diagnosis not present

## 2014-09-03 DIAGNOSIS — J449 Chronic obstructive pulmonary disease, unspecified: Secondary | ICD-10-CM | POA: Diagnosis not present

## 2014-09-06 DIAGNOSIS — D500 Iron deficiency anemia secondary to blood loss (chronic): Secondary | ICD-10-CM | POA: Diagnosis not present

## 2014-09-06 DIAGNOSIS — F0391 Unspecified dementia with behavioral disturbance: Secondary | ICD-10-CM | POA: Diagnosis not present

## 2014-09-06 DIAGNOSIS — K625 Hemorrhage of anus and rectum: Secondary | ICD-10-CM | POA: Diagnosis not present

## 2014-09-06 DIAGNOSIS — R109 Unspecified abdominal pain: Secondary | ICD-10-CM | POA: Diagnosis not present

## 2014-09-16 DIAGNOSIS — F0391 Unspecified dementia with behavioral disturbance: Secondary | ICD-10-CM | POA: Diagnosis not present

## 2014-09-16 DIAGNOSIS — J449 Chronic obstructive pulmonary disease, unspecified: Secondary | ICD-10-CM | POA: Diagnosis not present

## 2014-09-16 DIAGNOSIS — M549 Dorsalgia, unspecified: Secondary | ICD-10-CM | POA: Diagnosis not present

## 2014-09-17 NOTE — Progress Notes (Signed)
Cardiology Office Note   Date:  09/18/2014   ID:  [name], DOB Jul 11, 1940, MRN [id number]  PCP:  Garret Reddish, MD  Cardiologist:  Dr. Sherren Mocha     Chief Complaint  Patient presents with  . Atrial Fibrillation     History of Present Illness: [name] is a 74 y.o. male with a hx of atrial fibrillation, CAD status post CABG in 6269, diastolic HF, diabetes, peripheral vascular disease status post prior iliac stenting, and severe COPD, alcohol abuse. The patient had acute on chronic respiratory failure thought to be exacerbated by atrial fibrillation with RVR. He underwent cardioversion in 03/2014. However, he reverted to atrial fibrillation. He was then started on amiodarone. It was difficult to achieve a therapeutic INR and cardioversion on amiodarone has not been performed yet. The patient underwent elbow surgery in April and there was hematoma noted. There was a question of whether Coumadin caused this problem. He was not restarted on Coumadin after his surgery.    Last seen by Dr. Burt Knack 08/19/14.  CHADS2-VASc=5.  He was placed on Pradaxa 150 mg twice a day. At the time that he saw Dr. Burt Knack, his rate was better controlled. It was decided to bring him back today with an eye towards cardioversion should he desire this.  He was admitted 5/28-6/3 with syncope. This occurred after sneezing and was felt to be vasovagal. Telemetry was negative. Troponins remained normal. He was also treated for AECOPD which may have exacerbated his syncope. CT was negative for pulmonary embolism. Of note, the patient was holding his Pradaxa secondary to left elbow hematoma. He was asked to resume this upon discharge.  He is currently staying at [facility] in [place].  He is here with his son.  His O2 on 3 L is 81%.  He remains SOB.  He is SOB at rest.  He denies orthopnea.  Denies further syncope. He has had an episode or near syncope since DC and fell again.  He has a Lumbar compression  fx.  He has a chronic cough.  He had several episodes of rectal bleeding and his Pradaxa was stopped.  He is on ASA. No further bleeding.     Studies/Reports Reviewed Today:  Holter 48 hours 03/05/14 NSR, A. Fib/flutter, short runs of NSVT  Echo 02/21/14 - Mild concentric hypertrophy. EF 55% to 60%. Wallmotion was normal - Mitral valve: Calcified annulus. Mildly thickened leaflets . - Left atrium: The atrium was mildly dilated.  Myoview 01/2010 Overall Impression Comments: There is moderate scar in the inferior wall. There is no ischemia  LHC 07/2009 LM: Distal 40% LAD: Proximal 70-80%, mid 70% LCx: RI 80%, OM1 and OM to 70% RCA: Mid 50%, distal 70% EF 60% with inferior HK   Past Medical History  Diagnosis Date  . HYPERLIPIDEMIA 11/21/2006  . HYPERTENSION 11/21/2006  . MYOCARDIAL INFARCTION, HX OF 11/21/2006  . CORONARY ARTERY DISEASE 11/21/2006  . OSTEOPOROSIS 11/21/2006  . COLONIC POLYPS 05/21/2007  . COPD 05/21/2007  . HIATAL HERNIA 05/21/2007  . DIVERTICULAR DISEASE 05/21/2007  . NEPHROLITHIASIS 05/21/2007  . GI BLEEDING 09/09/2008  . ANEMIA, B12 DEFICIENCY 12/02/2008  . CAROTID ARTERY STENOSIS 04/16/2009  . PERIPHERAL VASCULAR DISEASE 09/02/2009  . Atrial fibrillation 11/06/2009  . Allergic rhinitis   . Osteoporosis   . Hemoptysis   . Internal hemorrhoids   . GERD (gastroesophageal reflux disease)   . Cataract   . Dysrhythmia     Afib  . Shortness of breath  dyspnea   . Pneumonia   . Arthritis   . History of kidney stones   . Diabetes mellitus without complication     pt. states he is not diabetic    Past Surgical History  Procedure Laterality Date  . Thyroidectomy      partial  . Coronary stent placement      03/07/2001  . Cholecystectomy    . Appendectomy    . Hemorrhoid surgery    . Inguinal hernia repair  10/10/08  . Coronary artery bypass graft  2011  . Ptca    . Cardioversion N/A 04/16/2014    Procedure: CARDIOVERSION;  Surgeon: Josue Hector, MD;   Location: [facility];  Service: Cardiovascular;  Laterality: N/A;  . Olecranon bursectomy  07/09/2014  . Mass excision Left 07/09/2014    Procedure: LEFT ELBOW BURSECTOMY EXCISION MASS;  Surgeon: Iran Planas, MD;  Location: [facility];  Service: Orthopedics;  Laterality: Left;     Current Outpatient Prescriptions  Medication Sig Dispense Refill  . amiodarone (PACERONE) 200 MG tablet TAKE 1 TABLET (200 MG TOTAL) BY MOUTH DAILY. 30 tablet 5  . aspirin EC 81 MG tablet Take 81 mg by mouth daily.    . Calcium Carbonate-Vit D-Min (CALTRATE 600+D PLUS) 600-400 MG-UNIT per tablet Chew 1 tablet by mouth daily.      . cetirizine (ZYRTEC ALLERGY) 10 MG tablet Take 1 tablet (10 mg total) by mouth every morning.    . cyanocobalamin ([employer] VITAMIN B12) 2000 MCG tablet Take 1 tablet (2,000 mcg total) by mouth daily.    Marland Kitchen diltiazem (CARDIZEM CD) 180 MG 24 hr capsule Take 1 capsule (180 mg total) by mouth daily. 90 capsule 3  . doxycycline (VIBRA-TABS) 100 MG tablet Take 1 tablet (100 mg total) by mouth every 12 (twelve) hours. For 2 more days 4 tablet   . DULERA 100-5 MCG/ACT AERO INHALE 2 PUFFS INTO THE LUNGS TWICE A DAY (Patient taking differently: INHALE 2 PUFFS INTO THE LUNGS TWICE A DAY AS NEEDED FOR SHORTNESS OF BREATH) 13 g 2  . furosemide (LASIX) 40 MG tablet Take 1 tablet (40 mg total) by mouth daily. 30 tablet 3  . insulin aspart (NOVOLOG) 100 UNIT/ML injection Inject 3 Units into the skin 3 (three) times daily with meals. 10 mL 11  . insulin detemir (LEVEMIR) 100 UNIT/ML injection Inject 0.05 mLs (5 Units total) into the skin daily. 10 mL 11  . pantoprazole (PROTONIX) 40 MG tablet TAKE 1 TABLET BY MOUTH TWICE A DAY 180 tablet 1  . potassium chloride SA (K-DUR,KLOR-CON) 20 MEQ tablet Take 1 tablet (20 mEq total) by mouth daily. 30 tablet 3  . rosuvastatin (CRESTOR) 20 MG tablet Take 10 mg by mouth daily.     Marland Kitchen SPIRIVA HANDIHALER 18 MCG inhalation capsule INHALE CONTENTS OF 1 CAPSULE DAILY 30 capsule 0  .  XOPENEX HFA 45 MCG/ACT inhaler INHALE 2 PUFFS INTO THE LUNGS EVERY 4 (FOUR) HOURS AS NEEDED FOR WHEEZING. 15 Inhaler 2   No current facility-administered medications for this visit.    Allergies:   Xarelto; Ticlopidine hcl; and Lorazepam    Social History:  The patient  reports that he quit smoking about 4 years ago. His smoking use included Cigarettes. He has a 150 pack-year smoking history. He has never used smokeless tobacco. He reports that he drinks about 3.0 oz of alcohol per week. He reports that he does not use illicit drugs.   Family History:  The patient's family history  includes Arthritis in his mother; Colon cancer (age of onset: 1) in his brother; Heart attack in his father; Heart disease in his brother, brother, and father. There is no history of Stomach cancer.    ROS:   Please see the history of present illness.   Review of Systems  Constitution: Positive for malaise/fatigue.  Cardiovascular: Positive for dyspnea on exertion and irregular heartbeat.  Respiratory: Positive for snoring and wheezing.   Musculoskeletal: Positive for back pain.  Gastrointestinal: Positive for abdominal pain, diarrhea and hematochezia.  Neurological: Positive for dizziness.  All other systems reviewed and are negative.     PHYSICAL EXAM: VS:  BP 130/60 mmHg  Pulse 77  Ht 5\' 3"  (1.6 m)  Wt 132 lb (59.875 kg)  BMI 23.39 kg/m2  SpO2 81%    Wt Readings from Last 3 Encounters:  09/18/14 132 lb (59.875 kg)  08/29/14 164 lb 0.4 oz (74.4 kg)  08/19/14 151 lb 1.9 oz (68.548 kg)     GEN: chronically ill appearing male in a wheelchair in no acute distress HEENT: normal Neck: I cannot appreciate JVD,  no masses Cardiac:  Distant S1/S2, irreg irreg rhythm; no obvious murmur ,  no rubs or gallops, no edema   Respiratory:  Faint air sounds bilaterally, no wheezing, rhonchi or rales. GI: soft, nontender, nondistended, + BS MS: no deformity or atrophy Skin: warm and dry  Neuro:  CNs II-XII  intact, Strength and sensation are intact Psych: Normal affect   EKG:  EKG is ordered today.  It demonstrates:   AFlutter, HR 77   Recent Labs: 03/17/2014: Pro B Natriuretic peptide (BNP) 142.0* 05/13/2014: B Natriuretic Peptide 309.4* 05/16/2014: Magnesium 2.2 08/23/2014: ALT 15*; TSH 1.848 08/24/2014: Hemoglobin 13.1; Platelets 175 08/29/2014: BUN 23*; Creatinine, Ser 0.88; Potassium 4.4; Sodium 140  09/07/14 (done at SNF):  Hgb 9.9, MCV 102.2  09/10/14:  (done at SNF) Hgb 10.8, MCV 102.6   Lipid Panel    Component Value Date/Time   CHOL 135 06/04/2012 0920   TRIG 109.0 06/04/2012 0920   HDL 38.80* 06/04/2012 0920   CHOLHDL 3 06/04/2012 0920   VLDL 21.8 06/04/2012 0920   LDLCALC 74 06/04/2012 0920      ASSESSMENT AND PLAN:  Atrial fibrillation and flutter: He remains in atrial flutter today. Heart rate is controlled. He is now off of anticoagulation secondary to rectal bleeding. It appears that his hemoglobin did drop somewhat. He did not require transfusion with packed red blood cells. He has also had several falls. His bleeding risk appears to be too great to continue on anticoagulation at this point in time. I discussed this with Dr. Burt Knack, who also saw the patient, and agreed. He remains on amiodarone. We feel that he should continue this drug despite his severe lung problems. Rate control without it was extremely poor which contributed to worsening heart failure. We will continue with rate control strategy at this point.  Other emphysema: He appears to have end-stage COPD/emphysema. He has significant hypoxia. I called Dr. Deatra Ina, attending provider at the SNF. He notes that his oxygen typically runs in this range. The patient is not in any distress at this point. Continue follow-up with attending provider at SNF.  Chronic diastolic congestive heart failure: Volume appears stable. Continue current therapy.  Coronary artery disease involving native coronary artery of native heart  without angina pectoris: No apparent angina. He remains on aspirin, statin.  Essential hypertension: Controlled.  Hyperlipidemia: Continue statin.  Syncope, unspecified syncope type: This  was likely vasovagal. No further workup.  DNR (do not resuscitate)   Medication Changes: Current medicines are reviewed at length with the patient today.  Concerns regarding medicines are as outlined above.  The following changes have been made:   Discontinued Medications   DABIGATRAN (PRADAXA) 150 MG CAPS CAPSULE    Take 1 capsule (150 mg total) by mouth 2 (two) times daily.   MORPHINE SULFATE (MORPHINE CONCENTRATE) 10 MG/0.5ML SOLN CONCENTRATED SOLUTION    Take 0.5 mLs (10 mg total) by mouth every 6 (six) hours as needed for moderate pain or shortness of breath.   PREDNISONE (DELTASONE) 20 MG TABLET    Take 3 tablets (60 mg total) by mouth daily with breakfast. 60 mg daily for 3 days then 40 mg daily for 3 days then 20 mg dailly for 3 days then stop   TRIAMCINOLONE (KENALOG) 0.025 % OINTMENT    APPLY 1 A SMALL AMOUNT TO AFFECTED AREA TWICE A DAY   Modified Medications   No medications on file   New Prescriptions   No medications on file     Labs/ tests ordered today include:   Orders Placed This Encounter  Procedures  . EKG 12-Lead     Disposition:   FU with Dr. Burt Knack 6 months.   Signed, [name], MHS 09/18/2014 12:47 PM    [facility] [place], [place], [place]  [address] Phone: [phone]; Fax: [phone]

## 2014-09-18 ENCOUNTER — Encounter: Payer: Self-pay | Admitting: Physician Assistant

## 2014-09-18 ENCOUNTER — Ambulatory Visit (INDEPENDENT_AMBULATORY_CARE_PROVIDER_SITE_OTHER): Payer: Medicare Other | Admitting: Physician Assistant

## 2014-09-18 VITALS — BP 130/60 | HR 77 | Ht 63.0 in | Wt 132.0 lb

## 2014-09-18 DIAGNOSIS — I481 Persistent atrial fibrillation: Secondary | ICD-10-CM

## 2014-09-18 DIAGNOSIS — E785 Hyperlipidemia, unspecified: Secondary | ICD-10-CM

## 2014-09-18 DIAGNOSIS — R55 Syncope and collapse: Secondary | ICD-10-CM

## 2014-09-18 DIAGNOSIS — J438 Other emphysema: Secondary | ICD-10-CM

## 2014-09-18 DIAGNOSIS — I10 Essential (primary) hypertension: Secondary | ICD-10-CM

## 2014-09-18 DIAGNOSIS — I2510 Atherosclerotic heart disease of native coronary artery without angina pectoris: Secondary | ICD-10-CM | POA: Insufficient documentation

## 2014-09-18 DIAGNOSIS — I5032 Chronic diastolic (congestive) heart failure: Secondary | ICD-10-CM | POA: Diagnosis not present

## 2014-09-18 DIAGNOSIS — I4892 Unspecified atrial flutter: Secondary | ICD-10-CM

## 2014-09-18 DIAGNOSIS — Z66 Do not resuscitate: Secondary | ICD-10-CM

## 2014-09-18 DIAGNOSIS — I4891 Unspecified atrial fibrillation: Secondary | ICD-10-CM | POA: Diagnosis not present

## 2014-09-18 DIAGNOSIS — R0902 Hypoxemia: Secondary | ICD-10-CM

## 2014-09-19 DIAGNOSIS — R030 Elevated blood-pressure reading, without diagnosis of hypertension: Secondary | ICD-10-CM | POA: Diagnosis not present

## 2014-09-19 DIAGNOSIS — M545 Low back pain: Secondary | ICD-10-CM | POA: Diagnosis not present

## 2014-09-19 DIAGNOSIS — S32009A Unspecified fracture of unspecified lumbar vertebra, initial encounter for closed fracture: Secondary | ICD-10-CM | POA: Diagnosis not present

## 2014-10-09 DIAGNOSIS — F419 Anxiety disorder, unspecified: Secondary | ICD-10-CM | POA: Diagnosis not present

## 2014-10-09 DIAGNOSIS — R4189 Other symptoms and signs involving cognitive functions and awareness: Secondary | ICD-10-CM | POA: Diagnosis not present

## 2014-10-18 ENCOUNTER — Other Ambulatory Visit: Payer: Self-pay | Admitting: Nurse Practitioner

## 2014-10-30 DIAGNOSIS — R05 Cough: Secondary | ICD-10-CM | POA: Diagnosis not present

## 2014-10-30 DIAGNOSIS — R079 Chest pain, unspecified: Secondary | ICD-10-CM | POA: Diagnosis not present

## 2014-11-17 DIAGNOSIS — S32018D Other fracture of first lumbar vertebra, subsequent encounter for fracture with routine healing: Secondary | ICD-10-CM | POA: Diagnosis not present

## 2014-11-17 DIAGNOSIS — I5032 Chronic diastolic (congestive) heart failure: Secondary | ICD-10-CM | POA: Diagnosis not present

## 2014-11-17 DIAGNOSIS — E119 Type 2 diabetes mellitus without complications: Secondary | ICD-10-CM | POA: Diagnosis not present

## 2014-11-17 DIAGNOSIS — I2510 Atherosclerotic heart disease of native coronary artery without angina pectoris: Secondary | ICD-10-CM | POA: Diagnosis not present

## 2014-11-17 DIAGNOSIS — I4891 Unspecified atrial fibrillation: Secondary | ICD-10-CM | POA: Diagnosis not present

## 2014-11-17 DIAGNOSIS — J449 Chronic obstructive pulmonary disease, unspecified: Secondary | ICD-10-CM | POA: Diagnosis not present

## 2014-11-18 DIAGNOSIS — J449 Chronic obstructive pulmonary disease, unspecified: Secondary | ICD-10-CM | POA: Diagnosis not present

## 2014-11-18 DIAGNOSIS — I5032 Chronic diastolic (congestive) heart failure: Secondary | ICD-10-CM | POA: Diagnosis not present

## 2014-11-18 DIAGNOSIS — I2510 Atherosclerotic heart disease of native coronary artery without angina pectoris: Secondary | ICD-10-CM | POA: Diagnosis not present

## 2014-11-18 DIAGNOSIS — I4891 Unspecified atrial fibrillation: Secondary | ICD-10-CM | POA: Diagnosis not present

## 2014-11-18 DIAGNOSIS — S32018D Other fracture of first lumbar vertebra, subsequent encounter for fracture with routine healing: Secondary | ICD-10-CM | POA: Diagnosis not present

## 2014-11-18 DIAGNOSIS — E119 Type 2 diabetes mellitus without complications: Secondary | ICD-10-CM | POA: Diagnosis not present

## 2014-11-20 DIAGNOSIS — S32018D Other fracture of first lumbar vertebra, subsequent encounter for fracture with routine healing: Secondary | ICD-10-CM | POA: Diagnosis not present

## 2014-11-20 DIAGNOSIS — I5032 Chronic diastolic (congestive) heart failure: Secondary | ICD-10-CM | POA: Diagnosis not present

## 2014-11-20 DIAGNOSIS — I4891 Unspecified atrial fibrillation: Secondary | ICD-10-CM | POA: Diagnosis not present

## 2014-11-20 DIAGNOSIS — E119 Type 2 diabetes mellitus without complications: Secondary | ICD-10-CM | POA: Diagnosis not present

## 2014-11-20 DIAGNOSIS — I2510 Atherosclerotic heart disease of native coronary artery without angina pectoris: Secondary | ICD-10-CM | POA: Diagnosis not present

## 2014-11-20 DIAGNOSIS — J449 Chronic obstructive pulmonary disease, unspecified: Secondary | ICD-10-CM | POA: Diagnosis not present

## 2014-11-21 ENCOUNTER — Telehealth: Payer: Self-pay | Admitting: Family Medicine

## 2014-11-21 DIAGNOSIS — I2510 Atherosclerotic heart disease of native coronary artery without angina pectoris: Secondary | ICD-10-CM | POA: Diagnosis not present

## 2014-11-21 DIAGNOSIS — S32018D Other fracture of first lumbar vertebra, subsequent encounter for fracture with routine healing: Secondary | ICD-10-CM | POA: Diagnosis not present

## 2014-11-21 DIAGNOSIS — J449 Chronic obstructive pulmonary disease, unspecified: Secondary | ICD-10-CM | POA: Diagnosis not present

## 2014-11-21 DIAGNOSIS — I5032 Chronic diastolic (congestive) heart failure: Secondary | ICD-10-CM | POA: Diagnosis not present

## 2014-11-21 DIAGNOSIS — E119 Type 2 diabetes mellitus without complications: Secondary | ICD-10-CM | POA: Diagnosis not present

## 2014-11-21 DIAGNOSIS — I4891 Unspecified atrial fibrillation: Secondary | ICD-10-CM | POA: Diagnosis not present

## 2014-11-21 NOTE — Telephone Encounter (Signed)
Mallory w/ [facility] needs  OT verbal orders for 1 week/1  Followed by 2 week /5  pls call back. Thanks!

## 2014-11-21 NOTE — Telephone Encounter (Signed)
Called and lm on mallory vm with verbal order.

## 2014-11-24 DIAGNOSIS — I2510 Atherosclerotic heart disease of native coronary artery without angina pectoris: Secondary | ICD-10-CM | POA: Diagnosis not present

## 2014-11-24 DIAGNOSIS — S32018D Other fracture of first lumbar vertebra, subsequent encounter for fracture with routine healing: Secondary | ICD-10-CM | POA: Diagnosis not present

## 2014-11-24 DIAGNOSIS — E119 Type 2 diabetes mellitus without complications: Secondary | ICD-10-CM | POA: Diagnosis not present

## 2014-11-24 DIAGNOSIS — I5032 Chronic diastolic (congestive) heart failure: Secondary | ICD-10-CM | POA: Diagnosis not present

## 2014-11-24 DIAGNOSIS — I4891 Unspecified atrial fibrillation: Secondary | ICD-10-CM | POA: Diagnosis not present

## 2014-11-24 DIAGNOSIS — J449 Chronic obstructive pulmonary disease, unspecified: Secondary | ICD-10-CM | POA: Diagnosis not present

## 2014-11-25 DIAGNOSIS — S32018D Other fracture of first lumbar vertebra, subsequent encounter for fracture with routine healing: Secondary | ICD-10-CM | POA: Diagnosis not present

## 2014-11-25 DIAGNOSIS — J449 Chronic obstructive pulmonary disease, unspecified: Secondary | ICD-10-CM | POA: Diagnosis not present

## 2014-11-25 DIAGNOSIS — I4891 Unspecified atrial fibrillation: Secondary | ICD-10-CM | POA: Diagnosis not present

## 2014-11-25 DIAGNOSIS — E119 Type 2 diabetes mellitus without complications: Secondary | ICD-10-CM | POA: Diagnosis not present

## 2014-11-25 DIAGNOSIS — I5032 Chronic diastolic (congestive) heart failure: Secondary | ICD-10-CM | POA: Diagnosis not present

## 2014-11-25 DIAGNOSIS — I2510 Atherosclerotic heart disease of native coronary artery without angina pectoris: Secondary | ICD-10-CM | POA: Diagnosis not present

## 2014-11-26 DIAGNOSIS — I5032 Chronic diastolic (congestive) heart failure: Secondary | ICD-10-CM | POA: Diagnosis not present

## 2014-11-26 DIAGNOSIS — J449 Chronic obstructive pulmonary disease, unspecified: Secondary | ICD-10-CM | POA: Diagnosis not present

## 2014-11-26 DIAGNOSIS — S32018D Other fracture of first lumbar vertebra, subsequent encounter for fracture with routine healing: Secondary | ICD-10-CM | POA: Diagnosis not present

## 2014-11-26 DIAGNOSIS — I4891 Unspecified atrial fibrillation: Secondary | ICD-10-CM | POA: Diagnosis not present

## 2014-11-26 DIAGNOSIS — I2510 Atherosclerotic heart disease of native coronary artery without angina pectoris: Secondary | ICD-10-CM | POA: Diagnosis not present

## 2014-11-26 DIAGNOSIS — E119 Type 2 diabetes mellitus without complications: Secondary | ICD-10-CM | POA: Diagnosis not present

## 2014-11-27 DIAGNOSIS — I4891 Unspecified atrial fibrillation: Secondary | ICD-10-CM | POA: Diagnosis not present

## 2014-11-27 DIAGNOSIS — E119 Type 2 diabetes mellitus without complications: Secondary | ICD-10-CM | POA: Diagnosis not present

## 2014-11-27 DIAGNOSIS — I5032 Chronic diastolic (congestive) heart failure: Secondary | ICD-10-CM | POA: Diagnosis not present

## 2014-11-27 DIAGNOSIS — J449 Chronic obstructive pulmonary disease, unspecified: Secondary | ICD-10-CM | POA: Diagnosis not present

## 2014-11-27 DIAGNOSIS — I2510 Atherosclerotic heart disease of native coronary artery without angina pectoris: Secondary | ICD-10-CM | POA: Diagnosis not present

## 2014-11-27 DIAGNOSIS — S32018D Other fracture of first lumbar vertebra, subsequent encounter for fracture with routine healing: Secondary | ICD-10-CM | POA: Diagnosis not present

## 2014-11-28 ENCOUNTER — Telehealth: Payer: Self-pay | Admitting: Family Medicine

## 2014-11-28 DIAGNOSIS — J449 Chronic obstructive pulmonary disease, unspecified: Secondary | ICD-10-CM | POA: Diagnosis not present

## 2014-11-28 DIAGNOSIS — I5032 Chronic diastolic (congestive) heart failure: Secondary | ICD-10-CM | POA: Diagnosis not present

## 2014-11-28 DIAGNOSIS — S32018D Other fracture of first lumbar vertebra, subsequent encounter for fracture with routine healing: Secondary | ICD-10-CM | POA: Diagnosis not present

## 2014-11-28 DIAGNOSIS — I2510 Atherosclerotic heart disease of native coronary artery without angina pectoris: Secondary | ICD-10-CM | POA: Diagnosis not present

## 2014-11-28 DIAGNOSIS — I4891 Unspecified atrial fibrillation: Secondary | ICD-10-CM | POA: Diagnosis not present

## 2014-11-28 DIAGNOSIS — E119 Type 2 diabetes mellitus without complications: Secondary | ICD-10-CM | POA: Diagnosis not present

## 2014-11-28 NOTE — Telephone Encounter (Signed)
Verbal orders given  

## 2014-11-28 NOTE — Telephone Encounter (Signed)
Mallory from [facility] is calling the patient did not wear his oxygen last night and sats dropped  to 67 %. Please advise

## 2014-11-28 NOTE — Telephone Encounter (Signed)
Magda Paganini from [facility] [phone]) needs a verbal order for telemonitoring and a breather.

## 2014-11-28 NOTE — Telephone Encounter (Signed)
Called and spoke with Mallory and advised to place patient on 2L N/C until MD Yong Channel arrives to give further instruction on Tuesday. Mallory states she will call patient to make aware of applying oxygen. Spoke with MD Burchette about instructions and he agrees.

## 2014-11-28 NOTE — Telephone Encounter (Signed)
Verbal is fine

## 2014-12-02 DIAGNOSIS — S32018D Other fracture of first lumbar vertebra, subsequent encounter for fracture with routine healing: Secondary | ICD-10-CM | POA: Diagnosis not present

## 2014-12-02 DIAGNOSIS — I5032 Chronic diastolic (congestive) heart failure: Secondary | ICD-10-CM | POA: Diagnosis not present

## 2014-12-02 DIAGNOSIS — J449 Chronic obstructive pulmonary disease, unspecified: Secondary | ICD-10-CM | POA: Diagnosis not present

## 2014-12-02 DIAGNOSIS — I2510 Atherosclerotic heart disease of native coronary artery without angina pectoris: Secondary | ICD-10-CM | POA: Diagnosis not present

## 2014-12-02 DIAGNOSIS — I4891 Unspecified atrial fibrillation: Secondary | ICD-10-CM | POA: Diagnosis not present

## 2014-12-02 DIAGNOSIS — E119 Type 2 diabetes mellitus without complications: Secondary | ICD-10-CM | POA: Diagnosis not present

## 2014-12-03 DIAGNOSIS — I4891 Unspecified atrial fibrillation: Secondary | ICD-10-CM | POA: Diagnosis not present

## 2014-12-03 DIAGNOSIS — I5032 Chronic diastolic (congestive) heart failure: Secondary | ICD-10-CM | POA: Diagnosis not present

## 2014-12-03 DIAGNOSIS — I2510 Atherosclerotic heart disease of native coronary artery without angina pectoris: Secondary | ICD-10-CM | POA: Diagnosis not present

## 2014-12-03 DIAGNOSIS — S32018D Other fracture of first lumbar vertebra, subsequent encounter for fracture with routine healing: Secondary | ICD-10-CM | POA: Diagnosis not present

## 2014-12-03 DIAGNOSIS — J449 Chronic obstructive pulmonary disease, unspecified: Secondary | ICD-10-CM | POA: Diagnosis not present

## 2014-12-03 DIAGNOSIS — E119 Type 2 diabetes mellitus without complications: Secondary | ICD-10-CM | POA: Diagnosis not present

## 2014-12-03 NOTE — Telephone Encounter (Signed)
Meant to forward below message to you.

## 2014-12-03 NOTE — Telephone Encounter (Signed)
We have not seen patient since he left the hospital several months ago. I think it would be a good idea to try to get him in within next week to evaluate. Please make sure he is not having any shortness of breath or chest pain and that sats are above 90%. If any issues, concerns, we could work him in tomorrow or Thursday

## 2014-12-03 NOTE — Telephone Encounter (Signed)
FYI: Pt states he is not having any chest pain but is having SOB. His sats this morning was %87 at 4am then he rechecked again aroung 11 and it was %96. Pt trasferred to scheduling to make an appointment for Friday.

## 2014-12-03 NOTE — Telephone Encounter (Signed)
Please follow-up with patient's status and MD Hunter's recommendations on moving forward

## 2014-12-04 DIAGNOSIS — S32018D Other fracture of first lumbar vertebra, subsequent encounter for fracture with routine healing: Secondary | ICD-10-CM | POA: Diagnosis not present

## 2014-12-04 DIAGNOSIS — I5032 Chronic diastolic (congestive) heart failure: Secondary | ICD-10-CM | POA: Diagnosis not present

## 2014-12-04 DIAGNOSIS — I2510 Atherosclerotic heart disease of native coronary artery without angina pectoris: Secondary | ICD-10-CM | POA: Diagnosis not present

## 2014-12-04 DIAGNOSIS — J449 Chronic obstructive pulmonary disease, unspecified: Secondary | ICD-10-CM | POA: Diagnosis not present

## 2014-12-04 DIAGNOSIS — E119 Type 2 diabetes mellitus without complications: Secondary | ICD-10-CM | POA: Diagnosis not present

## 2014-12-04 DIAGNOSIS — I4891 Unspecified atrial fibrillation: Secondary | ICD-10-CM | POA: Diagnosis not present

## 2014-12-05 ENCOUNTER — Telehealth: Payer: Self-pay | Admitting: Cardiovascular Disease

## 2014-12-05 ENCOUNTER — Telehealth: Payer: Self-pay | Admitting: Family Medicine

## 2014-12-05 ENCOUNTER — Ambulatory Visit: Payer: Medicare Other | Admitting: Family Medicine

## 2014-12-05 DIAGNOSIS — I2510 Atherosclerotic heart disease of native coronary artery without angina pectoris: Secondary | ICD-10-CM | POA: Diagnosis not present

## 2014-12-05 DIAGNOSIS — I4891 Unspecified atrial fibrillation: Secondary | ICD-10-CM | POA: Diagnosis not present

## 2014-12-05 DIAGNOSIS — S32018D Other fracture of first lumbar vertebra, subsequent encounter for fracture with routine healing: Secondary | ICD-10-CM | POA: Diagnosis not present

## 2014-12-05 DIAGNOSIS — J449 Chronic obstructive pulmonary disease, unspecified: Secondary | ICD-10-CM | POA: Diagnosis not present

## 2014-12-05 DIAGNOSIS — I5032 Chronic diastolic (congestive) heart failure: Secondary | ICD-10-CM | POA: Diagnosis not present

## 2014-12-05 DIAGNOSIS — E119 Type 2 diabetes mellitus without complications: Secondary | ICD-10-CM | POA: Diagnosis not present

## 2014-12-05 NOTE — Telephone Encounter (Signed)
Therapist for [facility] states pt is waking every 30 to 40 min at night and his O2 stats are in the 80's at night.  Do you think a sleep study is appropriate? pls call Mallory w/ plan of action.

## 2014-12-05 NOTE — Telephone Encounter (Addendum)
Called Mallory with [facility] to discuss patient's O2 Saturation. Patient is wearing a nasal cannula at night and it keeps falling out. Advised Mallory to have patient wear a mask at night. Mallory informed me that patient refuses to wear a mask because he is claustrophobic. Encouraged Mallory to call patient's PCP, that this issue could be followed by patient's PCP.

## 2014-12-05 NOTE — Telephone Encounter (Signed)
New message    Per nurse: Pt states he has been waking up several time during the night with low oxygen stats Pt records that oxygen stats are 60-70 when waking up; pt is waking up every 30-45 minutes

## 2014-12-05 NOTE — Telephone Encounter (Signed)
See below, pt was supposed to see you today at 11:15 for this but cancelled.

## 2014-12-05 NOTE — Telephone Encounter (Signed)
I would have him wear his oxygen at night and schedule a follow up visit with me. Not clear why he cancelled.

## 2014-12-08 DIAGNOSIS — I4891 Unspecified atrial fibrillation: Secondary | ICD-10-CM | POA: Diagnosis not present

## 2014-12-08 DIAGNOSIS — I2510 Atherosclerotic heart disease of native coronary artery without angina pectoris: Secondary | ICD-10-CM | POA: Diagnosis not present

## 2014-12-08 DIAGNOSIS — J449 Chronic obstructive pulmonary disease, unspecified: Secondary | ICD-10-CM | POA: Diagnosis not present

## 2014-12-08 DIAGNOSIS — E119 Type 2 diabetes mellitus without complications: Secondary | ICD-10-CM | POA: Diagnosis not present

## 2014-12-08 DIAGNOSIS — I5032 Chronic diastolic (congestive) heart failure: Secondary | ICD-10-CM | POA: Diagnosis not present

## 2014-12-08 DIAGNOSIS — S32018D Other fracture of first lumbar vertebra, subsequent encounter for fracture with routine healing: Secondary | ICD-10-CM | POA: Diagnosis not present

## 2014-12-08 NOTE — Telephone Encounter (Signed)
Lm on mallory vm with Dr. Yong Channel reommendation

## 2014-12-09 ENCOUNTER — Telehealth: Payer: Self-pay | Admitting: Family Medicine

## 2014-12-09 DIAGNOSIS — I5032 Chronic diastolic (congestive) heart failure: Secondary | ICD-10-CM | POA: Diagnosis not present

## 2014-12-09 DIAGNOSIS — I4891 Unspecified atrial fibrillation: Secondary | ICD-10-CM | POA: Diagnosis not present

## 2014-12-09 DIAGNOSIS — I2510 Atherosclerotic heart disease of native coronary artery without angina pectoris: Secondary | ICD-10-CM | POA: Diagnosis not present

## 2014-12-09 DIAGNOSIS — J449 Chronic obstructive pulmonary disease, unspecified: Secondary | ICD-10-CM | POA: Diagnosis not present

## 2014-12-09 DIAGNOSIS — S32018D Other fracture of first lumbar vertebra, subsequent encounter for fracture with routine healing: Secondary | ICD-10-CM | POA: Diagnosis not present

## 2014-12-09 DIAGNOSIS — E119 Type 2 diabetes mellitus without complications: Secondary | ICD-10-CM | POA: Diagnosis not present

## 2014-12-09 NOTE — Telephone Encounter (Signed)
Adriane physical therapist from [facility] is calling to report patient  bp is up 137/94 this morning. Pt has gain 3.4 lbs since yesterday. Pt has a little productive cough. Pt has no swelling in feet. Please advise.

## 2014-12-09 NOTE — Telephone Encounter (Signed)
See below

## 2014-12-09 NOTE — Telephone Encounter (Signed)
Called and spoke with pt and he states he has been going to the [place] to handle his medical needs and advised pt to make an appointment with the [facility] if he continues to have shortness of breath which he states is coming from his COPD and [facility] and he is wearing his oxygen at night.

## 2014-12-09 NOTE — Telephone Encounter (Signed)
I would make sure he is not having progressive shortness of breath or swelling, if so he needs to be seen immediately. He has been advised to be seen on multiple occasions- let's try to schedule him to come in before the end of the week but immediately if he is having symptoms from weight gain.

## 2014-12-10 DIAGNOSIS — S32018D Other fracture of first lumbar vertebra, subsequent encounter for fracture with routine healing: Secondary | ICD-10-CM | POA: Diagnosis not present

## 2014-12-10 DIAGNOSIS — E119 Type 2 diabetes mellitus without complications: Secondary | ICD-10-CM | POA: Diagnosis not present

## 2014-12-10 DIAGNOSIS — I2510 Atherosclerotic heart disease of native coronary artery without angina pectoris: Secondary | ICD-10-CM | POA: Diagnosis not present

## 2014-12-10 DIAGNOSIS — I5032 Chronic diastolic (congestive) heart failure: Secondary | ICD-10-CM | POA: Diagnosis not present

## 2014-12-10 DIAGNOSIS — I4891 Unspecified atrial fibrillation: Secondary | ICD-10-CM | POA: Diagnosis not present

## 2014-12-10 DIAGNOSIS — J449 Chronic obstructive pulmonary disease, unspecified: Secondary | ICD-10-CM | POA: Diagnosis not present

## 2014-12-12 ENCOUNTER — Ambulatory Visit: Payer: Medicare Other | Admitting: Family Medicine

## 2014-12-12 DIAGNOSIS — J449 Chronic obstructive pulmonary disease, unspecified: Secondary | ICD-10-CM | POA: Diagnosis not present

## 2014-12-12 DIAGNOSIS — I2510 Atherosclerotic heart disease of native coronary artery without angina pectoris: Secondary | ICD-10-CM | POA: Diagnosis not present

## 2014-12-12 DIAGNOSIS — E119 Type 2 diabetes mellitus without complications: Secondary | ICD-10-CM | POA: Diagnosis not present

## 2014-12-12 DIAGNOSIS — S32018D Other fracture of first lumbar vertebra, subsequent encounter for fracture with routine healing: Secondary | ICD-10-CM | POA: Diagnosis not present

## 2014-12-12 DIAGNOSIS — I4891 Unspecified atrial fibrillation: Secondary | ICD-10-CM | POA: Diagnosis not present

## 2014-12-12 DIAGNOSIS — I5032 Chronic diastolic (congestive) heart failure: Secondary | ICD-10-CM | POA: Diagnosis not present

## 2014-12-13 ENCOUNTER — Telehealth: Payer: Self-pay | Admitting: Family Medicine

## 2014-12-15 NOTE — Telephone Encounter (Signed)
FYI

## 2014-12-15 NOTE — Telephone Encounter (Signed)
[facility] Night - Client TELEPHONE ADVICE RECORD [facility] Call Center Patient Name: [name] Gender: Male DOB: 12-07-1940 Age: 74 Y 14 M 30 D Return Phone Number: [phone] (Primary) Address: [place] City/State/Zip: [place] [address] Client [facility] Night - Client Client Site [facility] - Night Physician Garret Reddish Contact Type Call Call Type Triage / Clinical Caller Name Kendrick Fries Relationship To Patient Provider Return Phone Number [phone] (Primary) Chief Complaint Paging or Request for Consult Initial Comment Caller states [facility] and has to report a 3.8 weight gain to the on call cb# [phone]  Nurse Assessment Nurse: Malva Cogan, RN, Juliann Pulse Date/Time Eilene Ghazi Time): 12/13/2014 11:42:14 AM Confirm and document reason for call. If symptomatic, describe symptoms. ---Caller states that she is an RN with [employer] pt is a telemonitoring pt that has CHF & COPD & is on O2, also takes Lasix 40 mg daily as well as K+. Caller states that she needs to report 3.8 lb weight gain on pt to on call provider. Caller states that she has spoken with pt but is not with him now, reports that pt advised her that he was fasting for labs on Thursday & that he overate on Friday. Caller advised that on call provider will be paged. Has the patient traveled out of the country within the last 30 days? ---No Does the patient require triage? ---No Please document clinical information provided and list any resource used. ---See previous note.  Guidelines Guideline Title Affirmed Question Affirmed Notes Nurse Date/Time (Eastern Time) Disp. Time Eilene Ghazi Time) Disposition Final User 12/13/2014 11:21:14 AM Attempt made - message left Carola Rhine 12/13/2014 11:47:57 AM Send To RN Personal Malva Cogan, RN, Kathy 12/13/2014 12:24:01 PM Called On-Call Provider Malva Cogan, RN,  [facility] 12/13/2014 12:25:21 PM Call Completed Malva Cogan, RN, Juliann Pulse 12/13/2014 11:42:03 AM Clinical Call Yes Malva Cogan, RN, [facility] Phone DateTime Result/Outcome Message Type Notes Charlann Boxer [phone] 12/13/2014 12:24:01 PM Called On Call Provider - Reached Doctor Paged Charlann Boxer 12/13/2014 12:24:45 PM Spoke with On Call - General Message Result Spoke with Dr. Charlann Boxer, advised of situation & provided with caller's contact number & then transferred to caller per Dr. Thompson Caul request

## 2014-12-16 DIAGNOSIS — I5032 Chronic diastolic (congestive) heart failure: Secondary | ICD-10-CM | POA: Diagnosis not present

## 2014-12-16 DIAGNOSIS — J449 Chronic obstructive pulmonary disease, unspecified: Secondary | ICD-10-CM | POA: Diagnosis not present

## 2014-12-16 DIAGNOSIS — I4891 Unspecified atrial fibrillation: Secondary | ICD-10-CM | POA: Diagnosis not present

## 2014-12-16 DIAGNOSIS — S32018D Other fracture of first lumbar vertebra, subsequent encounter for fracture with routine healing: Secondary | ICD-10-CM | POA: Diagnosis not present

## 2014-12-16 DIAGNOSIS — I2510 Atherosclerotic heart disease of native coronary artery without angina pectoris: Secondary | ICD-10-CM | POA: Diagnosis not present

## 2014-12-16 DIAGNOSIS — E119 Type 2 diabetes mellitus without complications: Secondary | ICD-10-CM | POA: Diagnosis not present

## 2014-12-19 DIAGNOSIS — S32018D Other fracture of first lumbar vertebra, subsequent encounter for fracture with routine healing: Secondary | ICD-10-CM | POA: Diagnosis not present

## 2014-12-19 DIAGNOSIS — J449 Chronic obstructive pulmonary disease, unspecified: Secondary | ICD-10-CM | POA: Diagnosis not present

## 2014-12-19 DIAGNOSIS — I2510 Atherosclerotic heart disease of native coronary artery without angina pectoris: Secondary | ICD-10-CM | POA: Diagnosis not present

## 2014-12-19 DIAGNOSIS — I4891 Unspecified atrial fibrillation: Secondary | ICD-10-CM | POA: Diagnosis not present

## 2014-12-19 DIAGNOSIS — E119 Type 2 diabetes mellitus without complications: Secondary | ICD-10-CM | POA: Diagnosis not present

## 2014-12-19 DIAGNOSIS — I5032 Chronic diastolic (congestive) heart failure: Secondary | ICD-10-CM | POA: Diagnosis not present

## 2014-12-22 DIAGNOSIS — I4891 Unspecified atrial fibrillation: Secondary | ICD-10-CM | POA: Diagnosis not present

## 2014-12-22 DIAGNOSIS — J449 Chronic obstructive pulmonary disease, unspecified: Secondary | ICD-10-CM | POA: Diagnosis not present

## 2014-12-22 DIAGNOSIS — I2510 Atherosclerotic heart disease of native coronary artery without angina pectoris: Secondary | ICD-10-CM | POA: Diagnosis not present

## 2014-12-22 DIAGNOSIS — E119 Type 2 diabetes mellitus without complications: Secondary | ICD-10-CM | POA: Diagnosis not present

## 2014-12-22 DIAGNOSIS — I5032 Chronic diastolic (congestive) heart failure: Secondary | ICD-10-CM | POA: Diagnosis not present

## 2014-12-22 DIAGNOSIS — S32018D Other fracture of first lumbar vertebra, subsequent encounter for fracture with routine healing: Secondary | ICD-10-CM | POA: Diagnosis not present

## 2014-12-23 DIAGNOSIS — E119 Type 2 diabetes mellitus without complications: Secondary | ICD-10-CM | POA: Diagnosis not present

## 2014-12-23 DIAGNOSIS — I5032 Chronic diastolic (congestive) heart failure: Secondary | ICD-10-CM | POA: Diagnosis not present

## 2014-12-23 DIAGNOSIS — I2510 Atherosclerotic heart disease of native coronary artery without angina pectoris: Secondary | ICD-10-CM | POA: Diagnosis not present

## 2014-12-23 DIAGNOSIS — S32018D Other fracture of first lumbar vertebra, subsequent encounter for fracture with routine healing: Secondary | ICD-10-CM | POA: Diagnosis not present

## 2014-12-23 DIAGNOSIS — I4891 Unspecified atrial fibrillation: Secondary | ICD-10-CM | POA: Diagnosis not present

## 2014-12-23 DIAGNOSIS — J449 Chronic obstructive pulmonary disease, unspecified: Secondary | ICD-10-CM | POA: Diagnosis not present

## 2014-12-24 DIAGNOSIS — I5032 Chronic diastolic (congestive) heart failure: Secondary | ICD-10-CM | POA: Diagnosis not present

## 2014-12-24 DIAGNOSIS — I4891 Unspecified atrial fibrillation: Secondary | ICD-10-CM | POA: Diagnosis not present

## 2014-12-24 DIAGNOSIS — S32018D Other fracture of first lumbar vertebra, subsequent encounter for fracture with routine healing: Secondary | ICD-10-CM | POA: Diagnosis not present

## 2014-12-24 DIAGNOSIS — E119 Type 2 diabetes mellitus without complications: Secondary | ICD-10-CM | POA: Diagnosis not present

## 2014-12-24 DIAGNOSIS — J449 Chronic obstructive pulmonary disease, unspecified: Secondary | ICD-10-CM | POA: Diagnosis not present

## 2014-12-24 DIAGNOSIS — I2510 Atherosclerotic heart disease of native coronary artery without angina pectoris: Secondary | ICD-10-CM | POA: Diagnosis not present

## 2014-12-25 ENCOUNTER — Telehealth: Payer: Self-pay | Admitting: Family Medicine

## 2014-12-25 DIAGNOSIS — I2510 Atherosclerotic heart disease of native coronary artery without angina pectoris: Secondary | ICD-10-CM | POA: Diagnosis not present

## 2014-12-25 DIAGNOSIS — J449 Chronic obstructive pulmonary disease, unspecified: Secondary | ICD-10-CM | POA: Diagnosis not present

## 2014-12-25 DIAGNOSIS — S32018D Other fracture of first lumbar vertebra, subsequent encounter for fracture with routine healing: Secondary | ICD-10-CM | POA: Diagnosis not present

## 2014-12-25 DIAGNOSIS — I4891 Unspecified atrial fibrillation: Secondary | ICD-10-CM | POA: Diagnosis not present

## 2014-12-25 DIAGNOSIS — I5032 Chronic diastolic (congestive) heart failure: Secondary | ICD-10-CM | POA: Diagnosis not present

## 2014-12-25 DIAGNOSIS — E119 Type 2 diabetes mellitus without complications: Secondary | ICD-10-CM | POA: Diagnosis not present

## 2014-12-25 NOTE — Telephone Encounter (Signed)
Ok to give verbal 

## 2014-12-25 NOTE — Telephone Encounter (Signed)
[name] is calling requesting VO for occupational therapy twice a wk for 2 wks and once a wk for 1 wks.

## 2014-12-25 NOTE — Telephone Encounter (Signed)
Lm on Mallory vm for VO.

## 2014-12-25 NOTE — Telephone Encounter (Signed)
I am pretty sure this requires face to face and we have not seen him. I cannot provide face to face for this but if that is not required- you may give verbal

## 2014-12-29 DIAGNOSIS — I5032 Chronic diastolic (congestive) heart failure: Secondary | ICD-10-CM | POA: Diagnosis not present

## 2014-12-29 DIAGNOSIS — S32018D Other fracture of first lumbar vertebra, subsequent encounter for fracture with routine healing: Secondary | ICD-10-CM | POA: Diagnosis not present

## 2014-12-29 DIAGNOSIS — E119 Type 2 diabetes mellitus without complications: Secondary | ICD-10-CM | POA: Diagnosis not present

## 2014-12-29 DIAGNOSIS — I2510 Atherosclerotic heart disease of native coronary artery without angina pectoris: Secondary | ICD-10-CM | POA: Diagnosis not present

## 2014-12-29 DIAGNOSIS — J449 Chronic obstructive pulmonary disease, unspecified: Secondary | ICD-10-CM | POA: Diagnosis not present

## 2014-12-29 DIAGNOSIS — I4891 Unspecified atrial fibrillation: Secondary | ICD-10-CM | POA: Diagnosis not present

## 2014-12-31 DIAGNOSIS — S32018D Other fracture of first lumbar vertebra, subsequent encounter for fracture with routine healing: Secondary | ICD-10-CM | POA: Diagnosis not present

## 2014-12-31 DIAGNOSIS — I4891 Unspecified atrial fibrillation: Secondary | ICD-10-CM | POA: Diagnosis not present

## 2014-12-31 DIAGNOSIS — J449 Chronic obstructive pulmonary disease, unspecified: Secondary | ICD-10-CM | POA: Diagnosis not present

## 2014-12-31 DIAGNOSIS — I5032 Chronic diastolic (congestive) heart failure: Secondary | ICD-10-CM | POA: Diagnosis not present

## 2014-12-31 DIAGNOSIS — I2510 Atherosclerotic heart disease of native coronary artery without angina pectoris: Secondary | ICD-10-CM | POA: Diagnosis not present

## 2014-12-31 DIAGNOSIS — E119 Type 2 diabetes mellitus without complications: Secondary | ICD-10-CM | POA: Diagnosis not present

## 2015-01-02 DIAGNOSIS — I5032 Chronic diastolic (congestive) heart failure: Secondary | ICD-10-CM | POA: Diagnosis not present

## 2015-01-02 DIAGNOSIS — S32018D Other fracture of first lumbar vertebra, subsequent encounter for fracture with routine healing: Secondary | ICD-10-CM | POA: Diagnosis not present

## 2015-01-02 DIAGNOSIS — J449 Chronic obstructive pulmonary disease, unspecified: Secondary | ICD-10-CM | POA: Diagnosis not present

## 2015-01-02 DIAGNOSIS — I4891 Unspecified atrial fibrillation: Secondary | ICD-10-CM | POA: Diagnosis not present

## 2015-01-02 DIAGNOSIS — I2510 Atherosclerotic heart disease of native coronary artery without angina pectoris: Secondary | ICD-10-CM | POA: Diagnosis not present

## 2015-01-02 DIAGNOSIS — E119 Type 2 diabetes mellitus without complications: Secondary | ICD-10-CM | POA: Diagnosis not present

## 2015-01-05 DIAGNOSIS — S32018D Other fracture of first lumbar vertebra, subsequent encounter for fracture with routine healing: Secondary | ICD-10-CM | POA: Diagnosis not present

## 2015-01-05 DIAGNOSIS — I5032 Chronic diastolic (congestive) heart failure: Secondary | ICD-10-CM | POA: Diagnosis not present

## 2015-01-05 DIAGNOSIS — E119 Type 2 diabetes mellitus without complications: Secondary | ICD-10-CM | POA: Diagnosis not present

## 2015-01-05 DIAGNOSIS — I2510 Atherosclerotic heart disease of native coronary artery without angina pectoris: Secondary | ICD-10-CM | POA: Diagnosis not present

## 2015-01-05 DIAGNOSIS — I4891 Unspecified atrial fibrillation: Secondary | ICD-10-CM | POA: Diagnosis not present

## 2015-01-05 DIAGNOSIS — J449 Chronic obstructive pulmonary disease, unspecified: Secondary | ICD-10-CM | POA: Diagnosis not present

## 2015-01-06 DIAGNOSIS — I5032 Chronic diastolic (congestive) heart failure: Secondary | ICD-10-CM | POA: Diagnosis not present

## 2015-01-06 DIAGNOSIS — S32018D Other fracture of first lumbar vertebra, subsequent encounter for fracture with routine healing: Secondary | ICD-10-CM | POA: Diagnosis not present

## 2015-01-06 DIAGNOSIS — J449 Chronic obstructive pulmonary disease, unspecified: Secondary | ICD-10-CM | POA: Diagnosis not present

## 2015-01-06 DIAGNOSIS — I2510 Atherosclerotic heart disease of native coronary artery without angina pectoris: Secondary | ICD-10-CM | POA: Diagnosis not present

## 2015-01-06 DIAGNOSIS — E119 Type 2 diabetes mellitus without complications: Secondary | ICD-10-CM | POA: Diagnosis not present

## 2015-01-06 DIAGNOSIS — I4891 Unspecified atrial fibrillation: Secondary | ICD-10-CM | POA: Diagnosis not present

## 2015-01-07 DIAGNOSIS — J449 Chronic obstructive pulmonary disease, unspecified: Secondary | ICD-10-CM | POA: Diagnosis not present

## 2015-01-07 DIAGNOSIS — S32018D Other fracture of first lumbar vertebra, subsequent encounter for fracture with routine healing: Secondary | ICD-10-CM | POA: Diagnosis not present

## 2015-01-07 DIAGNOSIS — E119 Type 2 diabetes mellitus without complications: Secondary | ICD-10-CM | POA: Diagnosis not present

## 2015-01-07 DIAGNOSIS — I2510 Atherosclerotic heart disease of native coronary artery without angina pectoris: Secondary | ICD-10-CM | POA: Diagnosis not present

## 2015-01-07 DIAGNOSIS — I4891 Unspecified atrial fibrillation: Secondary | ICD-10-CM | POA: Diagnosis not present

## 2015-01-07 DIAGNOSIS — I5032 Chronic diastolic (congestive) heart failure: Secondary | ICD-10-CM | POA: Diagnosis not present

## 2015-01-08 DIAGNOSIS — I5032 Chronic diastolic (congestive) heart failure: Secondary | ICD-10-CM | POA: Diagnosis not present

## 2015-01-08 DIAGNOSIS — S32018D Other fracture of first lumbar vertebra, subsequent encounter for fracture with routine healing: Secondary | ICD-10-CM | POA: Diagnosis not present

## 2015-01-08 DIAGNOSIS — J449 Chronic obstructive pulmonary disease, unspecified: Secondary | ICD-10-CM | POA: Diagnosis not present

## 2015-01-08 DIAGNOSIS — I4891 Unspecified atrial fibrillation: Secondary | ICD-10-CM | POA: Diagnosis not present

## 2015-01-08 DIAGNOSIS — E119 Type 2 diabetes mellitus without complications: Secondary | ICD-10-CM | POA: Diagnosis not present

## 2015-01-08 DIAGNOSIS — I2510 Atherosclerotic heart disease of native coronary artery without angina pectoris: Secondary | ICD-10-CM | POA: Diagnosis not present

## 2015-01-12 ENCOUNTER — Telehealth: Payer: Self-pay | Admitting: Family Medicine

## 2015-01-12 DIAGNOSIS — I2510 Atherosclerotic heart disease of native coronary artery without angina pectoris: Secondary | ICD-10-CM | POA: Diagnosis not present

## 2015-01-12 DIAGNOSIS — E119 Type 2 diabetes mellitus without complications: Secondary | ICD-10-CM | POA: Diagnosis not present

## 2015-01-12 DIAGNOSIS — I4891 Unspecified atrial fibrillation: Secondary | ICD-10-CM | POA: Diagnosis not present

## 2015-01-12 DIAGNOSIS — J449 Chronic obstructive pulmonary disease, unspecified: Secondary | ICD-10-CM | POA: Diagnosis not present

## 2015-01-12 DIAGNOSIS — I5032 Chronic diastolic (congestive) heart failure: Secondary | ICD-10-CM | POA: Diagnosis not present

## 2015-01-12 DIAGNOSIS — S32018D Other fracture of first lumbar vertebra, subsequent encounter for fracture with routine healing: Secondary | ICD-10-CM | POA: Diagnosis not present

## 2015-01-12 NOTE — Telephone Encounter (Signed)
Returned Mallory call and provided VO.

## 2015-01-12 NOTE — Telephone Encounter (Signed)
[facility] occupational therapist  from [facility] would like verbal order for 1 additional occupational therapy vist for this patient

## 2015-01-14 DIAGNOSIS — I2510 Atherosclerotic heart disease of native coronary artery without angina pectoris: Secondary | ICD-10-CM | POA: Diagnosis not present

## 2015-01-14 DIAGNOSIS — E119 Type 2 diabetes mellitus without complications: Secondary | ICD-10-CM | POA: Diagnosis not present

## 2015-01-14 DIAGNOSIS — I4891 Unspecified atrial fibrillation: Secondary | ICD-10-CM | POA: Diagnosis not present

## 2015-01-14 DIAGNOSIS — S32018D Other fracture of first lumbar vertebra, subsequent encounter for fracture with routine healing: Secondary | ICD-10-CM | POA: Diagnosis not present

## 2015-01-14 DIAGNOSIS — J449 Chronic obstructive pulmonary disease, unspecified: Secondary | ICD-10-CM | POA: Diagnosis not present

## 2015-01-14 DIAGNOSIS — I5032 Chronic diastolic (congestive) heart failure: Secondary | ICD-10-CM | POA: Diagnosis not present

## 2015-01-16 DIAGNOSIS — I2510 Atherosclerotic heart disease of native coronary artery without angina pectoris: Secondary | ICD-10-CM | POA: Diagnosis not present

## 2015-01-16 DIAGNOSIS — J449 Chronic obstructive pulmonary disease, unspecified: Secondary | ICD-10-CM | POA: Diagnosis not present

## 2015-01-16 DIAGNOSIS — I110 Hypertensive heart disease with heart failure: Secondary | ICD-10-CM | POA: Diagnosis not present

## 2015-01-16 DIAGNOSIS — I4891 Unspecified atrial fibrillation: Secondary | ICD-10-CM | POA: Diagnosis not present

## 2015-01-16 DIAGNOSIS — E1151 Type 2 diabetes mellitus with diabetic peripheral angiopathy without gangrene: Secondary | ICD-10-CM | POA: Diagnosis not present

## 2015-01-16 DIAGNOSIS — I5032 Chronic diastolic (congestive) heart failure: Secondary | ICD-10-CM | POA: Diagnosis not present

## 2015-01-19 ENCOUNTER — Encounter: Payer: Self-pay | Admitting: Internal Medicine

## 2015-01-21 DIAGNOSIS — I5032 Chronic diastolic (congestive) heart failure: Secondary | ICD-10-CM | POA: Diagnosis not present

## 2015-01-21 DIAGNOSIS — I4891 Unspecified atrial fibrillation: Secondary | ICD-10-CM | POA: Diagnosis not present

## 2015-01-21 DIAGNOSIS — I2510 Atherosclerotic heart disease of native coronary artery without angina pectoris: Secondary | ICD-10-CM | POA: Diagnosis not present

## 2015-01-21 DIAGNOSIS — I110 Hypertensive heart disease with heart failure: Secondary | ICD-10-CM | POA: Diagnosis not present

## 2015-01-21 DIAGNOSIS — E1151 Type 2 diabetes mellitus with diabetic peripheral angiopathy without gangrene: Secondary | ICD-10-CM | POA: Diagnosis not present

## 2015-01-21 DIAGNOSIS — J449 Chronic obstructive pulmonary disease, unspecified: Secondary | ICD-10-CM | POA: Diagnosis not present

## 2015-01-27 ENCOUNTER — Telehealth (HOSPITAL_COMMUNITY): Payer: Self-pay

## 2015-01-27 DIAGNOSIS — Z6825 Body mass index (BMI) 25.0-25.9, adult: Secondary | ICD-10-CM | POA: Diagnosis not present

## 2015-01-27 DIAGNOSIS — S32000D Wedge compression fracture of unspecified lumbar vertebra, subsequent encounter for fracture with routine healing: Secondary | ICD-10-CM | POA: Diagnosis not present

## 2015-01-27 DIAGNOSIS — M810 Age-related osteoporosis without current pathological fracture: Secondary | ICD-10-CM | POA: Diagnosis not present

## 2015-01-27 DIAGNOSIS — S32009A Unspecified fracture of unspecified lumbar vertebra, initial encounter for closed fracture: Secondary | ICD-10-CM | POA: Diagnosis not present

## 2015-01-27 DIAGNOSIS — M545 Low back pain: Secondary | ICD-10-CM | POA: Diagnosis not present

## 2015-01-27 NOTE — Telephone Encounter (Addendum)
Patients son/caregiver called and stated patient was no longer interested in attending pulmonary rehab. Son stated his [name] does not drive and the son has other obligations and is unable to commit to bring his father to pulmonary rehab every Tuesday and Thursday. Encouraged patients son to contact us if his transportation circumstances changed.

## 2015-01-28 DIAGNOSIS — I2510 Atherosclerotic heart disease of native coronary artery without angina pectoris: Secondary | ICD-10-CM | POA: Diagnosis not present

## 2015-01-28 DIAGNOSIS — I110 Hypertensive heart disease with heart failure: Secondary | ICD-10-CM | POA: Diagnosis not present

## 2015-01-28 DIAGNOSIS — I5032 Chronic diastolic (congestive) heart failure: Secondary | ICD-10-CM | POA: Diagnosis not present

## 2015-01-28 DIAGNOSIS — E1151 Type 2 diabetes mellitus with diabetic peripheral angiopathy without gangrene: Secondary | ICD-10-CM | POA: Diagnosis not present

## 2015-01-28 DIAGNOSIS — J449 Chronic obstructive pulmonary disease, unspecified: Secondary | ICD-10-CM | POA: Diagnosis not present

## 2015-01-28 DIAGNOSIS — I4891 Unspecified atrial fibrillation: Secondary | ICD-10-CM | POA: Diagnosis not present

## 2015-02-04 DIAGNOSIS — I4891 Unspecified atrial fibrillation: Secondary | ICD-10-CM | POA: Diagnosis not present

## 2015-02-04 DIAGNOSIS — I2510 Atherosclerotic heart disease of native coronary artery without angina pectoris: Secondary | ICD-10-CM | POA: Diagnosis not present

## 2015-02-04 DIAGNOSIS — E1151 Type 2 diabetes mellitus with diabetic peripheral angiopathy without gangrene: Secondary | ICD-10-CM | POA: Diagnosis not present

## 2015-02-04 DIAGNOSIS — I110 Hypertensive heart disease with heart failure: Secondary | ICD-10-CM | POA: Diagnosis not present

## 2015-02-04 DIAGNOSIS — I5032 Chronic diastolic (congestive) heart failure: Secondary | ICD-10-CM | POA: Diagnosis not present

## 2015-02-04 DIAGNOSIS — J449 Chronic obstructive pulmonary disease, unspecified: Secondary | ICD-10-CM | POA: Diagnosis not present

## 2015-02-11 DIAGNOSIS — I4891 Unspecified atrial fibrillation: Secondary | ICD-10-CM | POA: Diagnosis not present

## 2015-02-11 DIAGNOSIS — J449 Chronic obstructive pulmonary disease, unspecified: Secondary | ICD-10-CM | POA: Diagnosis not present

## 2015-02-11 DIAGNOSIS — E1151 Type 2 diabetes mellitus with diabetic peripheral angiopathy without gangrene: Secondary | ICD-10-CM | POA: Diagnosis not present

## 2015-02-11 DIAGNOSIS — I5032 Chronic diastolic (congestive) heart failure: Secondary | ICD-10-CM | POA: Diagnosis not present

## 2015-02-11 DIAGNOSIS — I2510 Atherosclerotic heart disease of native coronary artery without angina pectoris: Secondary | ICD-10-CM | POA: Diagnosis not present

## 2015-02-11 DIAGNOSIS — I110 Hypertensive heart disease with heart failure: Secondary | ICD-10-CM | POA: Diagnosis not present

## 2015-02-14 ENCOUNTER — Emergency Department (HOSPITAL_COMMUNITY): Payer: Medicare Other

## 2015-02-14 ENCOUNTER — Inpatient Hospital Stay (HOSPITAL_COMMUNITY)
Admission: EM | Admit: 2015-02-14 | Discharge: 2015-02-19 | DRG: 190 | Disposition: A | Discharge status: Skilled Nursing Facility | Payer: Medicare Other | Attending: Oncology | Admitting: Oncology

## 2015-02-14 ENCOUNTER — Encounter (HOSPITAL_COMMUNITY): Payer: Self-pay

## 2015-02-14 DIAGNOSIS — I5030 Unspecified diastolic (congestive) heart failure: Secondary | ICD-10-CM | POA: Diagnosis present

## 2015-02-14 DIAGNOSIS — D509 Iron deficiency anemia, unspecified: Secondary | ICD-10-CM | POA: Diagnosis present

## 2015-02-14 DIAGNOSIS — J431 Panlobular emphysema: Secondary | ICD-10-CM | POA: Diagnosis not present

## 2015-02-14 DIAGNOSIS — E785 Hyperlipidemia, unspecified: Secondary | ICD-10-CM | POA: Diagnosis present

## 2015-02-14 DIAGNOSIS — S22069A Unspecified fracture of T7-T8 vertebra, initial encounter for closed fracture: Secondary | ICD-10-CM | POA: Diagnosis present

## 2015-02-14 DIAGNOSIS — I110 Hypertensive heart disease with heart failure: Secondary | ICD-10-CM | POA: Diagnosis present

## 2015-02-14 DIAGNOSIS — Z794 Long term (current) use of insulin: Secondary | ICD-10-CM

## 2015-02-14 DIAGNOSIS — E118 Type 2 diabetes mellitus with unspecified complications: Secondary | ICD-10-CM | POA: Diagnosis not present

## 2015-02-14 DIAGNOSIS — I2510 Atherosclerotic heart disease of native coronary artery without angina pectoris: Secondary | ICD-10-CM | POA: Diagnosis present

## 2015-02-14 DIAGNOSIS — Z79899 Other long term (current) drug therapy: Secondary | ICD-10-CM

## 2015-02-14 DIAGNOSIS — W19XXXA Unspecified fall, initial encounter: Secondary | ICD-10-CM | POA: Diagnosis present

## 2015-02-14 DIAGNOSIS — Z9181 History of falling: Secondary | ICD-10-CM

## 2015-02-14 DIAGNOSIS — E119 Type 2 diabetes mellitus without complications: Secondary | ICD-10-CM | POA: Diagnosis present

## 2015-02-14 DIAGNOSIS — F419 Anxiety disorder, unspecified: Secondary | ICD-10-CM | POA: Diagnosis present

## 2015-02-14 DIAGNOSIS — Z955 Presence of coronary angioplasty implant and graft: Secondary | ICD-10-CM | POA: Diagnosis not present

## 2015-02-14 DIAGNOSIS — I739 Peripheral vascular disease, unspecified: Secondary | ICD-10-CM | POA: Diagnosis present

## 2015-02-14 DIAGNOSIS — D518 Other vitamin B12 deficiency anemias: Secondary | ICD-10-CM | POA: Diagnosis present

## 2015-02-14 DIAGNOSIS — M4856XA Collapsed vertebra, not elsewhere classified, lumbar region, initial encounter for fracture: Secondary | ICD-10-CM | POA: Diagnosis present

## 2015-02-14 DIAGNOSIS — I482 Chronic atrial fibrillation, unspecified: Secondary | ICD-10-CM | POA: Diagnosis present

## 2015-02-14 DIAGNOSIS — Z66 Do not resuscitate: Secondary | ICD-10-CM | POA: Diagnosis present

## 2015-02-14 DIAGNOSIS — J449 Chronic obstructive pulmonary disease, unspecified: Secondary | ICD-10-CM | POA: Diagnosis not present

## 2015-02-14 DIAGNOSIS — I4892 Unspecified atrial flutter: Secondary | ICD-10-CM | POA: Diagnosis present

## 2015-02-14 DIAGNOSIS — R079 Chest pain, unspecified: Secondary | ICD-10-CM | POA: Diagnosis not present

## 2015-02-14 DIAGNOSIS — I10 Essential (primary) hypertension: Secondary | ICD-10-CM | POA: Diagnosis not present

## 2015-02-14 DIAGNOSIS — I252 Old myocardial infarction: Secondary | ICD-10-CM | POA: Diagnosis not present

## 2015-02-14 DIAGNOSIS — R2681 Unsteadiness on feet: Secondary | ICD-10-CM | POA: Diagnosis not present

## 2015-02-14 DIAGNOSIS — S32010A Wedge compression fracture of first lumbar vertebra, initial encounter for closed fracture: Secondary | ICD-10-CM | POA: Diagnosis present

## 2015-02-14 DIAGNOSIS — I6529 Occlusion and stenosis of unspecified carotid artery: Secondary | ICD-10-CM | POA: Diagnosis present

## 2015-02-14 DIAGNOSIS — Z3490 Encounter for supervision of normal pregnancy, unspecified, unspecified trimester: Secondary | ICD-10-CM

## 2015-02-14 DIAGNOSIS — R0602 Shortness of breath: Secondary | ICD-10-CM | POA: Diagnosis not present

## 2015-02-14 DIAGNOSIS — Z87891 Personal history of nicotine dependence: Secondary | ICD-10-CM

## 2015-02-14 DIAGNOSIS — E538 Deficiency of other specified B group vitamins: Secondary | ICD-10-CM | POA: Diagnosis present

## 2015-02-14 DIAGNOSIS — E872 Acidosis: Secondary | ICD-10-CM | POA: Diagnosis present

## 2015-02-14 DIAGNOSIS — H269 Unspecified cataract: Secondary | ICD-10-CM | POA: Diagnosis present

## 2015-02-14 DIAGNOSIS — J440 Chronic obstructive pulmonary disease with acute lower respiratory infection: Principal | ICD-10-CM | POA: Diagnosis present

## 2015-02-14 DIAGNOSIS — J189 Pneumonia, unspecified organism: Secondary | ICD-10-CM | POA: Diagnosis present

## 2015-02-14 DIAGNOSIS — Y95 Nosocomial condition: Secondary | ICD-10-CM | POA: Diagnosis present

## 2015-02-14 DIAGNOSIS — R911 Solitary pulmonary nodule: Secondary | ICD-10-CM | POA: Diagnosis present

## 2015-02-14 DIAGNOSIS — K219 Gastro-esophageal reflux disease without esophagitis: Secondary | ICD-10-CM | POA: Diagnosis present

## 2015-02-14 DIAGNOSIS — E876 Hypokalemia: Secondary | ICD-10-CM | POA: Diagnosis present

## 2015-02-14 DIAGNOSIS — Z9981 Dependence on supplemental oxygen: Secondary | ICD-10-CM

## 2015-02-14 DIAGNOSIS — M1990 Unspecified osteoarthritis, unspecified site: Secondary | ICD-10-CM | POA: Diagnosis present

## 2015-02-14 DIAGNOSIS — R29898 Other symptoms and signs involving the musculoskeletal system: Secondary | ICD-10-CM | POA: Diagnosis not present

## 2015-02-14 DIAGNOSIS — Z86010 Personal history of colonic polyps: Secondary | ICD-10-CM | POA: Diagnosis not present

## 2015-02-14 DIAGNOSIS — M810 Age-related osteoporosis without current pathological fracture: Secondary | ICD-10-CM | POA: Diagnosis present

## 2015-02-14 DIAGNOSIS — S22011A Stable burst fracture of first thoracic vertebra, initial encounter for closed fracture: Secondary | ICD-10-CM | POA: Diagnosis present

## 2015-02-14 DIAGNOSIS — Z7901 Long term (current) use of anticoagulants: Secondary | ICD-10-CM | POA: Diagnosis not present

## 2015-02-14 DIAGNOSIS — S32010D Wedge compression fracture of first lumbar vertebra, subsequent encounter for fracture with routine healing: Secondary | ICD-10-CM | POA: Diagnosis not present

## 2015-02-14 DIAGNOSIS — Z951 Presence of aortocoronary bypass graft: Secondary | ICD-10-CM

## 2015-02-14 DIAGNOSIS — M7020 Olecranon bursitis, unspecified elbow: Secondary | ICD-10-CM | POA: Diagnosis not present

## 2015-02-14 DIAGNOSIS — M6281 Muscle weakness (generalized): Secondary | ICD-10-CM | POA: Diagnosis not present

## 2015-02-14 LAB — URINALYSIS, ROUTINE W REFLEX MICROSCOPIC
Bilirubin Urine: NEGATIVE
GLUCOSE, UA: NEGATIVE mg/dL
HGB URINE DIPSTICK: NEGATIVE
Ketones, ur: NEGATIVE mg/dL
Leukocytes, UA: NEGATIVE
Nitrite: NEGATIVE
PH: 6 (ref 5.0–8.0)
Protein, ur: NEGATIVE mg/dL
SPECIFIC GRAVITY, URINE: 1.015 (ref 1.005–1.030)

## 2015-02-14 LAB — COMPREHENSIVE METABOLIC PANEL
ALT: 29 U/L (ref 17–63)
AST: 24 U/L (ref 15–41)
Albumin: 2.8 g/dL — ABNORMAL LOW (ref 3.5–5.0)
Alkaline Phosphatase: 47 U/L (ref 38–126)
Anion gap: 9 (ref 5–15)
BUN: 18 mg/dL (ref 6–20)
CHLORIDE: 96 mmol/L — AB (ref 101–111)
CO2: 28 mmol/L (ref 22–32)
Calcium: 8.1 mg/dL — ABNORMAL LOW (ref 8.9–10.3)
Creatinine, Ser: 1.02 mg/dL (ref 0.61–1.24)
Glucose, Bld: 160 mg/dL — ABNORMAL HIGH (ref 65–99)
POTASSIUM: 4 mmol/L (ref 3.5–5.1)
SODIUM: 133 mmol/L — AB (ref 135–145)
Total Bilirubin: 1.3 mg/dL — ABNORMAL HIGH (ref 0.3–1.2)
Total Protein: 6.1 g/dL — ABNORMAL LOW (ref 6.5–8.1)

## 2015-02-14 LAB — CBC WITH DIFFERENTIAL/PLATELET
Basophils Absolute: 0 10*3/uL (ref 0.0–0.1)
Basophils Relative: 0 %
Eosinophils Absolute: 0 10*3/uL (ref 0.0–0.7)
Eosinophils Relative: 0 %
HCT: 33.1 % — ABNORMAL LOW (ref 39.0–52.0)
HEMOGLOBIN: 10.1 g/dL — AB (ref 13.0–17.0)
LYMPHS ABS: 0.8 10*3/uL (ref 0.7–4.0)
LYMPHS PCT: 6 %
MCH: 26.2 pg (ref 26.0–34.0)
MCHC: 30.5 g/dL (ref 30.0–36.0)
MCV: 86 fL (ref 78.0–100.0)
Monocytes Absolute: 1.1 10*3/uL — ABNORMAL HIGH (ref 0.1–1.0)
Monocytes Relative: 9 %
NEUTROS PCT: 85 %
Neutro Abs: 10.6 10*3/uL — ABNORMAL HIGH (ref 1.7–7.7)
Platelets: 164 10*3/uL (ref 150–400)
RBC: 3.85 MIL/uL — AB (ref 4.22–5.81)
RDW: 17.8 % — ABNORMAL HIGH (ref 11.5–15.5)
WBC: 12.5 10*3/uL — AB (ref 4.0–10.5)

## 2015-02-14 LAB — I-STAT CG4 LACTIC ACID, ED: Lactic Acid, Venous: 1.04 mmol/L (ref 0.5–2.0)

## 2015-02-14 LAB — CBC
HEMATOCRIT: 32.2 % — AB (ref 39.0–52.0)
HEMOGLOBIN: 9.8 g/dL — AB (ref 13.0–17.0)
MCH: 26.3 pg (ref 26.0–34.0)
MCHC: 30.4 g/dL (ref 30.0–36.0)
MCV: 86.3 fL (ref 78.0–100.0)
Platelets: 166 10*3/uL (ref 150–400)
RBC: 3.73 MIL/uL — ABNORMAL LOW (ref 4.22–5.81)
RDW: 18 % — ABNORMAL HIGH (ref 11.5–15.5)
WBC: 9.9 10*3/uL (ref 4.0–10.5)

## 2015-02-14 LAB — TROPONIN I
TROPONIN I: 0.03 ng/mL (ref <0.031)
Troponin I: 0.03 ng/mL (ref <0.031)

## 2015-02-14 LAB — BRAIN NATRIURETIC PEPTIDE: B Natriuretic Peptide: 403.1 pg/mL — ABNORMAL HIGH (ref 0.0–100.0)

## 2015-02-14 LAB — I-STAT TROPONIN, ED: TROPONIN I, POC: 0.03 ng/mL (ref 0.00–0.08)

## 2015-02-14 LAB — GLUCOSE, CAPILLARY
Glucose-Capillary: 150 mg/dL — ABNORMAL HIGH (ref 65–99)
Glucose-Capillary: 142 mg/dL — ABNORMAL HIGH (ref 65–99)

## 2015-02-14 LAB — LACTIC ACID, PLASMA: Lactic Acid, Venous: 1.2 mmol/L (ref 0.5–2.0)

## 2015-02-14 LAB — CREATININE, SERUM
Creatinine, Ser: 1.15 mg/dL (ref 0.61–1.24)
GFR calc Af Amer: >60 mL/min (ref >60)

## 2015-02-14 MED ORDER — INSULIN DETEMIR 100 UNIT/ML ~~LOC~~ SOLN
5.0000 [IU] | Freq: Every day | SUBCUTANEOUS | Status: DC
  Administered 2015-02-14 – 2015-02-19 (×6): 5 [IU] via SUBCUTANEOUS
  Filled 2015-02-14 (×7): qty 0.05

## 2015-02-14 MED ORDER — ENOXAPARIN SODIUM 40 MG/0.4ML ~~LOC~~ SOLN
40.0000 mg | SUBCUTANEOUS | Status: DC
Start: 2015-02-14 — End: 2015-02-19
  Administered 2015-02-14 – 2015-02-18 (×5): 40 mg via SUBCUTANEOUS
  Filled 2015-02-14 (×5): qty 0.4

## 2015-02-14 MED ORDER — ALBUTEROL SULFATE (2.5 MG/3ML) 0.083% IN NEBU
2.5000 mg | INHALATION_SOLUTION | Freq: Four times a day (QID) | RESPIRATORY_TRACT | Status: DC | PRN
Start: 2015-02-14 — End: 2015-02-16
  Administered 2015-02-14 – 2015-02-16 (×3): 2.5 mg via RESPIRATORY_TRACT
  Filled 2015-02-14 (×3): qty 3

## 2015-02-14 MED ORDER — ALENDRONATE SODIUM 70 MG PO TABS: 70.0000 mg | ORAL_TABLET | ORAL | Status: DC

## 2015-02-14 MED ORDER — FUROSEMIDE 40 MG PO TABS
40.0000 mg | ORAL_TABLET | Freq: Every day | ORAL | Status: DC
  Administered 2015-02-15 – 2015-02-19 (×5): 40 mg via ORAL
  Filled 2015-02-14 (×5): qty 1

## 2015-02-14 MED ORDER — AZITHROMYCIN 500 MG PO TABS
500.0000 mg | ORAL_TABLET | Freq: Every day | ORAL | Status: AC
  Administered 2015-02-14: 500 mg via ORAL
  Filled 2015-02-14: qty 1

## 2015-02-14 MED ORDER — CHLORHEXIDINE GLUCONATE 0.12 % MT SOLN
15.0000 mL | Freq: Two times a day (BID) | OROMUCOSAL | Status: DC
  Administered 2015-02-14 – 2015-02-19 (×11): 15 mL via OROMUCOSAL
  Filled 2015-02-14 (×11): qty 15

## 2015-02-14 MED ORDER — DEXTROSE 5 % IV SOLN
2.0000 g | Freq: Three times a day (TID) | INTRAVENOUS | Status: DC
  Administered 2015-02-14: 2 g via INTRAVENOUS
  Filled 2015-02-14 (×3): qty 2

## 2015-02-14 MED ORDER — INSULIN ASPART 100 UNIT/ML ~~LOC~~ SOLN
0.0000 [IU] | Freq: Three times a day (TID) | SUBCUTANEOUS | Status: DC
  Administered 2015-02-14 – 2015-02-15 (×2): 1 [IU] via SUBCUTANEOUS
  Administered 2015-02-15: 5 [IU] via SUBCUTANEOUS
  Administered 2015-02-16: 2 [IU] via SUBCUTANEOUS
  Administered 2015-02-16: 1 [IU] via SUBCUTANEOUS
  Administered 2015-02-17 – 2015-02-18 (×4): 2 [IU] via SUBCUTANEOUS
  Administered 2015-02-18: 1 [IU] via SUBCUTANEOUS
  Administered 2015-02-18: 2 [IU] via SUBCUTANEOUS
  Administered 2015-02-19: 7 [IU] via SUBCUTANEOUS
  Administered 2015-02-19: 3 [IU] via SUBCUTANEOUS

## 2015-02-14 MED ORDER — VANCOMYCIN HCL 500 MG IV SOLR
500.0000 mg | Freq: Two times a day (BID) | INTRAVENOUS | Status: DC
  Filled 2015-02-14: qty 500

## 2015-02-14 MED ORDER — TIOTROPIUM BROMIDE MONOHYDRATE 18 MCG IN CAPS
1.0000 | ORAL_CAPSULE | Freq: Every day | RESPIRATORY_TRACT | Status: DC
  Administered 2015-02-14 – 2015-02-19 (×6): 18 ug via RESPIRATORY_TRACT
  Filled 2015-02-14 (×2): qty 5

## 2015-02-14 MED ORDER — PANTOPRAZOLE SODIUM 40 MG PO TBEC
40.0000 mg | DELAYED_RELEASE_TABLET | Freq: Two times a day (BID) | ORAL | Status: DC
  Administered 2015-02-14 – 2015-02-19 (×11): 40 mg via ORAL
  Filled 2015-02-14 (×11): qty 1

## 2015-02-14 MED ORDER — CALCIUM CARBONATE-VITAMIN D 500-200 MG-UNIT PO TABS
1.0000 | ORAL_TABLET | Freq: Every day | ORAL | Status: DC
  Administered 2015-02-14 – 2015-02-19 (×6): 1 via ORAL
  Filled 2015-02-14 (×6): qty 1

## 2015-02-14 MED ORDER — BUDESONIDE-FORMOTEROL FUMARATE 160-4.5 MCG/ACT IN AERO
2.0000 | INHALATION_SPRAY | Freq: Two times a day (BID) | RESPIRATORY_TRACT | Status: DC
  Administered 2015-02-14 – 2015-02-19 (×10): 2 via RESPIRATORY_TRACT
  Filled 2015-02-14: qty 6

## 2015-02-14 MED ORDER — ALBUTEROL SULFATE (2.5 MG/3ML) 0.083% IN NEBU
2.5000 mg | INHALATION_SOLUTION | RESPIRATORY_TRACT | Status: DC
  Administered 2015-02-14 – 2015-02-16 (×10): 2.5 mg via RESPIRATORY_TRACT
  Filled 2015-02-14 (×11): qty 3

## 2015-02-14 MED ORDER — CEFTRIAXONE SODIUM 1 G IJ SOLR
1.0000 g | INTRAMUSCULAR | Status: DC
  Administered 2015-02-14 – 2015-02-18 (×5): 1 g via INTRAVENOUS
  Filled 2015-02-14 (×6): qty 10

## 2015-02-14 MED ORDER — BUSPIRONE HCL 15 MG PO TABS
7.5000 mg | ORAL_TABLET | Freq: Two times a day (BID) | ORAL | Status: DC
  Administered 2015-02-14 – 2015-02-19 (×11): 7.5 mg via ORAL
  Filled 2015-02-14 (×11): qty 1

## 2015-02-14 MED ORDER — CALTRATE 600+D PLUS 600-400 MG-UNIT PO CHEW: 1.0000 | CHEWABLE_TABLET | Freq: Every day | ORAL | Status: DC

## 2015-02-14 MED ORDER — DILTIAZEM HCL ER COATED BEADS 180 MG PO CP24
180.0000 mg | ORAL_CAPSULE | Freq: Every day | ORAL | Status: DC
  Administered 2015-02-14 – 2015-02-19 (×6): 180 mg via ORAL
  Filled 2015-02-14 (×6): qty 1

## 2015-02-14 MED ORDER — CETYLPYRIDINIUM CHLORIDE 0.05 % MT LIQD
7.0000 mL | Freq: Two times a day (BID) | OROMUCOSAL | Status: DC
  Administered 2015-02-14 – 2015-02-18 (×3): 7 mL via OROMUCOSAL

## 2015-02-14 MED ORDER — LORATADINE 10 MG PO TABS
10.0000 mg | ORAL_TABLET | Freq: Every day | ORAL | Status: DC
  Administered 2015-02-14 – 2015-02-19 (×6): 10 mg via ORAL
  Filled 2015-02-14 (×6): qty 1

## 2015-02-14 MED ORDER — ENSURE ENLIVE PO LIQD
237.0000 mL | Freq: Two times a day (BID) | ORAL | Status: DC
  Administered 2015-02-14 – 2015-02-16 (×4): 237 mL via ORAL

## 2015-02-14 MED ORDER — VANCOMYCIN HCL IN DEXTROSE 1-5 GM/200ML-% IV SOLN
1000.0000 mg | Freq: Once | INTRAVENOUS | Status: AC
  Administered 2015-02-14: 1000 mg via INTRAVENOUS
  Filled 2015-02-14: qty 200

## 2015-02-14 MED ORDER — ATORVASTATIN CALCIUM 40 MG PO TABS
40.0000 mg | ORAL_TABLET | Freq: Every day | ORAL | Status: DC
  Administered 2015-02-14 – 2015-02-19 (×6): 40 mg via ORAL
  Filled 2015-02-14 (×6): qty 1

## 2015-02-14 MED ORDER — AMIODARONE HCL 200 MG PO TABS
200.0000 mg | ORAL_TABLET | Freq: Every day | ORAL | Status: DC
  Administered 2015-02-14 – 2015-02-19 (×6): 200 mg via ORAL
  Filled 2015-02-14 (×7): qty 1

## 2015-02-14 MED ORDER — VITAMIN B-12 1000 MCG PO TABS
2000.0000 ug | ORAL_TABLET | Freq: Every day | ORAL | Status: DC
  Administered 2015-02-14 – 2015-02-19 (×6): 2000 ug via ORAL
  Filled 2015-02-14 (×6): qty 2

## 2015-02-14 MED ORDER — FUROSEMIDE 10 MG/ML IJ SOLN
80.0000 mg | Freq: Once | INTRAMUSCULAR | Status: AC
  Administered 2015-02-14: 80 mg via INTRAVENOUS
  Filled 2015-02-14: qty 8

## 2015-02-14 MED ORDER — ACETAMINOPHEN 500 MG PO TABS
500.0000 mg | ORAL_TABLET | Freq: Four times a day (QID) | ORAL | Status: DC | PRN
  Administered 2015-02-15: 500 mg via ORAL
  Filled 2015-02-14 (×2): qty 1

## 2015-02-14 MED ORDER — INFLUENZA VAC SPLIT QUAD 0.5 ML IM SUSY
0.5000 mL | PREFILLED_SYRINGE | INTRAMUSCULAR | Status: DC
  Filled 2015-02-14: qty 0.5

## 2015-02-14 MED ORDER — AZITHROMYCIN 500 MG PO TABS
250.0000 mg | ORAL_TABLET | Freq: Every day | ORAL | Status: AC
  Administered 2015-02-15 – 2015-02-18 (×4): 250 mg via ORAL
  Filled 2015-02-14 (×4): qty 1

## 2015-02-14 MED ORDER — CALCIUM CITRATE-VITAMIN D 500-400 MG-UNIT PO CHEW
1.0000 | CHEWABLE_TABLET | Freq: Every day | ORAL | Status: DC
Start: 2015-02-14 — End: 2015-02-14
  Filled 2015-02-14: qty 1

## 2015-02-14 NOTE — ED Notes (Signed)
Per EMS - pt nauseated around 1900 last night with sudden onset CP around 2300 last night. CP left side, rated 9/10. Pt called EMS, given 324mg  aspirin, 1 nitro - pain free since then. Audible wheezing and congested cough - cleared up some w/ 5 albuterol, .5 atrovent. Pt wears 2L at home. Hx CHF, COPD, A Fib.

## 2015-02-14 NOTE — Progress Notes (Signed)
02/14/2015 2:16 PM  Pt arrived to unit via stretcher.  Asleep on arrival, but arousable and oriented.  Pt denies pain.  Full assessment to EPIC, vitals stable.  Skin intact.  Pt placed on tele box 25, confirmed with CCMD.  Pt states he has fallen multiple times in the past six months--placed on a bed alarm and falls protocol initiated.  Educated patient on his fall risk and necessary interventions, to which he verbalized understanding.  Pt on chronic oxygen at 2L, in place currently.  Pt lives at home with his brother-in-law, plans to return upon discharge.  Oriented patient to room/unit, and instructed him on how to utilize the call bell, to which he verbalized understanding.  MD notified of patient's arrival to unit.  Will continue to monitor patient. Princella Pellegrini

## 2015-02-14 NOTE — Evaluation (Signed)
Physical Therapy Evaluation Patient Details Name: [name] MRN: [id number] DOB: [birth year] Today's Date: 02/14/2015   History of Present Illness  Patient is a 74 yo M with a PMHx of HTN, HLD, MI, CAD, A-fib, COPD, CHF, carotid artery stenosis, PVD, B12 deficiency, GERD, nephrolithiasis, osteoporosis brought into [facility] by EMS after patient experienced nausea and sudden onset CP  Clinical Impression  Pt admitted with the above complications. Pt currently with functional limitations due to the deficits listed below (see PT Problem List). Very pleasant gentleman with supportive son present during evaluation. SpO2 dropped to 83% on 3L supplemental O2 with short distance ambulation. Improved with 4L supplemental O2, seated rest break, and cues for pursed lip breathing. Reports multiple falls at home and pt demonstrate instability with gait requiring close guard assist. Would greatly benefit from SNF to improve functional strength, endurance, and independence. Pt will benefit from skilled PT to increase their independence and safety with mobility to allow discharge to the venue listed below.       Follow Up Recommendations SNF    Equipment Recommendations  None recommended by PT    Recommendations for Other Services       Precautions / Restrictions Precautions Precautions: Fall Precaution Comments: monitor O2. Has compression fracture of spine Restrictions Weight Bearing Restrictions: No      Mobility  Bed Mobility               General bed mobility comments: sitting EOB  Transfers Overall transfer level: Needs assistance Equipment used: Rolling walker (2 wheeled) Transfers: Sit to/from Stand Sit to Stand: Min assist         General transfer comment: Min assist for boost to stand from lowest bed setting x2. VC for hand placement.  Ambulation/Gait Ambulation/Gait assistance: Min guard Ambulation Distance (Feet): 55 Feet Assistive device: Rolling walker (2 wheeled) Gait  Pattern/deviations: Step-through pattern;Decreased stride length;Trunk flexed Gait velocity: decreased Gait velocity interpretation: Below normal speed for age/gender General Gait Details: Educated on safe DME use with a rolling walker. VC for upright posture, and close guard for safety. Discussed energy conservation techniques and pursed lip breathing. SpO2 dropped to 83% on 3L supplemental O2 Improved with seated rest break and 4L supplemental o2.  No overt loss of balance however pt seem anxious and needs cues for safety and to not rush.  Stairs            Wheelchair Mobility    Modified Rankin (Stroke Patients Only)       Balance Overall balance assessment: Needs assistance;History of Falls Sitting-balance support: No upper extremity supported;Feet supported Sitting balance-Leahy Scale: Fair     Standing balance support: Bilateral upper extremity supported Standing balance-Leahy Scale: Poor                               Pertinent Vitals/Pain Pain Assessment: No/denies pain    Home Living Family/patient expects to be discharged to:: Private residence Living Arrangements: Other relatives (brother-in-law) Available Help at Discharge: Family;Available PRN/intermittently Type of Home: House Home Access: Stairs to enter   Entrance Stairs-Number of Steps: 4 Home Layout: One level Home Equipment: Walker - 2 wheels      Prior Function Level of Independence: Needs assistance   Gait / Transfers Assistance Needed: Son reports pt sometimes needs assist to stand and has been using RW lately.     Comments: only walks 100' max in house, holds onto furniture and performs  own ADLs, brother-in-law does the cooking     Hand Dominance   Dominant Hand: Right    Extremity/Trunk Assessment   Upper Extremity Assessment: Defer to OT evaluation           Lower Extremity Assessment: Generalized weakness         Communication   Communication: No  difficulties  Cognition Arousal/Alertness: Awake/alert Behavior During Therapy: WFL for tasks assessed/performed Overall Cognitive Status: Within Functional Limits for tasks assessed                      General Comments General comments (skin integrity, edema, etc.): Son present and very supportive. Reports multiple falls at home. Both agree SNF safest option for rehab.    Exercises        Assessment/Plan    PT Assessment Patient needs continued PT services  PT Diagnosis Difficulty walking;Abnormality of gait;Generalized weakness   PT Problem List Decreased strength;Decreased activity tolerance;Decreased balance;Decreased mobility;Decreased knowledge of use of DME;Cardiopulmonary status limiting activity  PT Treatment Interventions DME instruction;Gait training;Functional mobility training;Therapeutic activities;Therapeutic exercise;Balance training;Patient/family education   PT Goals (Current goals can be found in the Care Plan section) Acute Rehab PT Goals Patient Stated Goal: Get better at rehab PT Goal Formulation: With patient Time For Goal Achievement: 02/28/15 Potential to Achieve Goals: Good    Frequency Min 3X/week   Barriers to discharge        Co-evaluation               End of Session Equipment Utilized During Treatment: Gait belt;Oxygen Activity Tolerance: Patient limited by fatigue Patient left: in chair;with call bell/phone within reach;with family/visitor present Nurse Communication: Mobility status;Other (comment) (SpO2)         Time: [id number] PT Time Calculation (min) (ACUTE ONLY): 28 min   Charges:   PT Evaluation $Initial PT Evaluation Tier I: 1 Procedure PT Treatments $Therapeutic Activity: 8-22 mins   PT G Codes:        Ellouise Newer 02/14/2015, 6:40 PM  Camille Bal [place], [place]

## 2015-02-14 NOTE — Progress Notes (Signed)
PHARMACIST - PHYSICIAN COMMUNICATION  CONCERNING: P&T Medication Policy Regarding Oral Bisphosphonates  RECOMMENDATION: Your order for alendronate (Fosamax), ibandronate (Boniva), or risedronate (Actonel) has been discontinued at this time.  If the patient's post-hospital medical condition warrants safe use of this class of drugs, please resume the pre-hospital regimen upon discharge.  DESCRIPTION:  Alendronate (Fosamax), ibandronate (Boniva), and risedronate (Actonel) can cause severe esophageal erosions in patients who are unable to remain upright at least 30 minutes after taking this medication.   Since brief interruptions in therapy are thought to have minimal impact on bone mineral density, the [place] has established that bisphosphonate orders should be routinely discontinued during hospitalization.   To override this safety policy and permit administration of Boniva, Fosamax, or Actonel in the hospital, prescribers must write "DO NOT HOLD" in the comments section when placing the order for this class of medications.  Nena Jordan, PharmD, BCPS 02/14/2015, 5:12 PM

## 2015-02-14 NOTE — ED Notes (Signed)
Attempted report x1. 

## 2015-02-14 NOTE — ED Provider Notes (Signed)
CSN: [id number]     Arrival date & time 02/14/15  0901 History   First MD Initiated Contact with Patient 02/14/15 0911     Chief Complaint  Patient presents with  . Chest Pain     (Consider location/radiation/quality/duration/timing/severity/associated sxs/prior Treatment) Patient is a 74 y.o. male presenting with chest pain. The history is provided by the patient, the EMS personnel and a relative.  Chest Pain Associated symptoms: back pain, cough, fatigue, nausea, shortness of breath and weakness   Associated symptoms: no abdominal pain, no fever, no headache and not vomiting    patient currently lives with his brother-in-law. Patient with onset of chest pain mid chest left side last night at 11 PM. Preceded by some nausea at 7 PM. Relative is noted that patient for the last few days has been weaker than usual seem to be having breathing problems didn't have his normal amount of energy. Patient has a history of COPD CHF and atrial fib. Patient is followed at the [facility] for his primary care doctor. Followed by Dr. Burt Knack from cardiology. EMS had given patient aspirin and nitroglycerin with relief of the chest pain on the way in. Also he had wheezing they treated him with albuterol and Atrovent. Patient normally wears 2 L of oxygen at home at all times. Patient is a DO NOT RESUSCITATE. Patient recently was in a nursing facility sometime in the last 3 months. Last admission was May 2016.  Past Medical History  Diagnosis Date  . HYPERLIPIDEMIA 11/21/2006  . HYPERTENSION 11/21/2006  . MYOCARDIAL INFARCTION, HX OF 11/21/2006  . CORONARY ARTERY DISEASE 11/21/2006  . OSTEOPOROSIS 11/21/2006  . COLONIC POLYPS 05/21/2007  . COPD 05/21/2007  . HIATAL HERNIA 05/21/2007  . DIVERTICULAR DISEASE 05/21/2007  . NEPHROLITHIASIS 05/21/2007  . GI BLEEDING 09/09/2008  . ANEMIA, B12 DEFICIENCY 12/02/2008  . CAROTID ARTERY STENOSIS 04/16/2009  . PERIPHERAL VASCULAR DISEASE 09/02/2009  . Atrial fibrillation ([place]) 11/06/2009   . Allergic rhinitis   . Osteoporosis   . Hemoptysis   . Internal hemorrhoids   . GERD (gastroesophageal reflux disease)   . Cataract   . Dysrhythmia     Afib  . Shortness of breath dyspnea   . Pneumonia   . Arthritis   . History of kidney stones   . Diabetes mellitus without complication ([place])     pt. states he is not diabetic   Past Surgical History  Procedure Laterality Date  . Thyroidectomy      partial  . Coronary stent placement      03/07/2001  . Cholecystectomy    . Appendectomy    . Hemorrhoid surgery    . Inguinal hernia repair  10/10/08  . Coronary artery bypass graft  2011  . Ptca    . Cardioversion N/A 04/16/2014    Procedure: CARDIOVERSION;  Surgeon: Josue Hector, MD;  Location: [facility];  Service: Cardiovascular;  Laterality: N/A;  . Olecranon bursectomy  07/09/2014  . Mass excision Left 07/09/2014    Procedure: LEFT ELBOW BURSECTOMY EXCISION MASS;  Surgeon: Iran Planas, MD;  Location: [facility];  Service: Orthopedics;  Laterality: Left;   Family History  Problem Relation Age of Onset  . Arthritis Mother     rheumatoid  . Heart attack Father   . Heart disease Father   . Heart disease Brother   . Colon cancer Brother 102  . Stomach cancer Neg Hx   . Heart disease Brother    Social History  Substance Use Topics  .  Smoking status: Former Smoker -- 3.00 packs/day for 50 years    Types: Cigarettes    Quit date: 09/25/2009  . Smokeless tobacco: Never Used  . Alcohol Use: 3.0 oz/week    6 Standard drinks or equivalent per week     Comment: [place] 2-3 a night    Review of Systems  Constitutional: Positive for fatigue. Negative for fever.  HENT: Positive for congestion.   Eyes: Negative for visual disturbance.  Respiratory: Positive for cough and shortness of breath.   Cardiovascular: Positive for chest pain.  Gastrointestinal: Positive for nausea. Negative for vomiting and abdominal pain.  Genitourinary: Negative for dysuria.  Musculoskeletal:  Positive for back pain.  Skin: Negative for rash.  Neurological: Positive for weakness. Negative for headaches.  Hematological: Does not bruise/bleed easily.  Psychiatric/Behavioral: Positive for confusion.      Allergies  Xarelto; Ticlopidine hcl; and Lorazepam  Home Medications   Prior to Admission medications   Medication Sig Start Date End Date Taking? Authorizing Provider  acetaminophen (TYLENOL) 500 MG tablet Take 500 mg by mouth every 6 (six) hours as needed for mild pain or moderate pain.   Yes Historical Provider, MD  albuterol (PROVENTIL) (2.5 MG/3ML) 0.083% nebulizer solution Take 2.5 mg by nebulization every 6 (six) hours as needed for wheezing or shortness of breath.   Yes Historical Provider, MD  alendronate (FOSAMAX) 70 MG tablet Take 70 mg by mouth once a week. Take with a full glass of water on an empty stomach.   Yes Historical Provider, MD  amiodarone (PACERONE) 200 MG tablet TAKE 1 TABLET (200 MG TOTAL) BY MOUTH DAILY. 07/25/14  Yes Marin Olp, MD  atorvastatin (LIPITOR) 40 MG tablet Take 40 mg by mouth daily.   Yes Historical Provider, MD  budesonide-formoterol (SYMBICORT) 160-4.5 MCG/ACT inhaler Inhale 2 puffs into the lungs 2 (two) times daily.   Yes Historical Provider, MD  busPIRone (BUSPAR) 7.5 MG tablet Take 7.5 mg by mouth 2 (two) times daily.   Yes Historical Provider, MD  Calcium Carbonate-Vit D-Min (CALTRATE 600+D PLUS) 600-400 MG-UNIT per tablet Chew 1 tablet by mouth daily.     Yes Historical Provider, MD  cetirizine (ZYRTEC ALLERGY) 10 MG tablet Take 1 tablet (10 mg total) by mouth every morning. 08/17/10  Yes Tammy S Parrett, NP  cyanocobalamin ([employer] VITAMIN B12) 2000 MCG tablet Take 1 tablet (2,000 mcg total) by mouth daily. 04/02/12  Yes Lisabeth Pick, MD  diltiazem (CARDIZEM CD) 180 MG 24 hr capsule Take 1 capsule (180 mg total) by mouth daily. 03/17/14  Yes Burtis Junes, NP  furosemide (LASIX) 40 MG tablet TAKE 1 TABLET BY MOUTH EVERY DAY 10/20/14   Yes Burtis Junes, NP  insulin lispro (HUMALOG) 100 UNIT/ML injection Inject 3 Units into the skin 3 (three) times daily before meals.   Yes Historical Provider, MD  OXYGEN Inhale 2 L into the lungs continuous.   Yes Historical Provider, MD  pantoprazole (PROTONIX) 40 MG tablet TAKE 1 TABLET BY MOUTH TWICE A DAY 05/19/14  Yes Marin Olp, MD  potassium chloride SA (K-DUR,KLOR-CON) 20 MEQ tablet Take 1 tablet (20 mEq total) by mouth daily. 03/17/14  Yes Burtis Junes, NP  SPIRIVA HANDIHALER 18 MCG inhalation capsule INHALE CONTENTS OF 1 CAPSULE DAILY 01/24/14  Yes Bruce Kendall Flack, MD  insulin detemir (LEVEMIR) 100 UNIT/ML injection Inject 0.05 mLs (5 Units total) into the skin daily. 08/29/14   Costin Karlyne Greenspan, MD   BP 123/81 mmHg  Pulse 73  Temp(Src) 99.6 F (37.6 C)  Resp 17  SpO2 96% Physical Exam  Constitutional: He appears well-developed and well-nourished. No distress.  HENT:  Head: Normocephalic and atraumatic.  His membranes dry.  Eyes: Conjunctivae and EOM are normal. Pupils are equal, round, and reactive to light.  Neck: Normal range of motion.  Cardiovascular: Normal rate, regular rhythm and normal heart sounds.   No murmur heard. Pulmonary/Chest: Effort normal. He has rales.  Abdominal: Soft. There is no tenderness.  Musculoskeletal: Normal range of motion.  Neurological: He is alert. No cranial nerve deficit. He exhibits normal muscle tone. Coordination normal.  Skin: Skin is warm. No rash noted.    ED Course  Procedures (including critical care time) Labs Review Labs Reviewed  COMPREHENSIVE METABOLIC PANEL - Abnormal; Notable for the following:    Sodium 133 (*)    Chloride 96 (*)    Glucose, Bld 160 (*)    Calcium 8.1 (*)    Total Protein 6.1 (*)    Albumin 2.8 (*)    Total Bilirubin 1.3 (*)    All other components within normal limits  CBC WITH DIFFERENTIAL/PLATELET - Abnormal; Notable for the following:    WBC 12.5 (*)    RBC 3.85 (*)    Hemoglobin  10.1 (*)    HCT 33.1 (*)    RDW 17.8 (*)    Neutro Abs 10.6 (*)    Monocytes Absolute 1.1 (*)    All other components within normal limits  BRAIN NATRIURETIC PEPTIDE - Abnormal; Notable for the following:    B Natriuretic Peptide 403.1 (*)    All other components within normal limits  CULTURE, BLOOD (ROUTINE X 2)  CULTURE, BLOOD (ROUTINE X 2)  URINE CULTURE  URINALYSIS, ROUTINE W REFLEX MICROSCOPIC (NOT AT [facility])  LACTIC ACID, PLASMA  I-STAT TROPOININ, ED  I-STAT CG4 LACTIC ACID, ED   Results for orders placed or performed during the hospital encounter of 02/14/15  Comprehensive metabolic panel  Result Value Ref Range   Sodium 133 (L) 135 - 145 mmol/L   Potassium 4.0 3.5 - 5.1 mmol/L   Chloride 96 (L) 101 - 111 mmol/L   CO2 28 22 - 32 mmol/L   Glucose, Bld 160 (H) 65 - 99 mg/dL   BUN 18 6 - 20 mg/dL   Creatinine, Ser 1.02 0.61 - 1.24 mg/dL   Calcium 8.1 (L) 8.9 - 10.3 mg/dL   Total Protein 6.1 (L) 6.5 - 8.1 g/dL   Albumin 2.8 (L) 3.5 - 5.0 g/dL   AST 24 15 - 41 U/L   ALT 29 17 - 63 U/L   Alkaline Phosphatase 47 38 - 126 U/L   Total Bilirubin 1.3 (H) 0.3 - 1.2 mg/dL   GFR calc non Af Amer >60 >60 mL/min   GFR calc Af Amer >60 >60 mL/min   Anion gap 9 5 - 15  CBC with Differential  Result Value Ref Range   WBC 12.5 (H) 4.0 - 10.5 K/uL   RBC 3.85 (L) 4.22 - 5.81 MIL/uL   Hemoglobin 10.1 (L) 13.0 - 17.0 g/dL   HCT 33.1 (L) 39.0 - 52.0 %   MCV 86.0 78.0 - 100.0 fL   MCH 26.2 26.0 - 34.0 pg   MCHC 30.5 30.0 - 36.0 g/dL   RDW 17.8 (H) 11.5 - 15.5 %   Platelets 164 150 - 400 K/uL   Neutrophils Relative % 85 %   Neutro Abs 10.6 (H) 1.7 - 7.7 K/uL  Lymphocytes Relative 6 %   Lymphs Abs 0.8 0.7 - 4.0 K/uL   Monocytes Relative 9 %   Monocytes Absolute 1.1 (H) 0.1 - 1.0 K/uL   Eosinophils Relative 0 %   Eosinophils Absolute 0.0 0.0 - 0.7 K/uL   Basophils Relative 0 %   Basophils Absolute 0.0 0.0 - 0.1 K/uL  Urinalysis, Routine w reflex microscopic (not at [facility])  Result  Value Ref Range   Color, Urine YELLOW YELLOW   APPearance CLEAR CLEAR   Specific Gravity, Urine 1.015 1.005 - 1.030   pH 6.0 5.0 - 8.0   Glucose, UA NEGATIVE NEGATIVE mg/dL   Hgb urine dipstick NEGATIVE NEGATIVE   Bilirubin Urine NEGATIVE NEGATIVE   Ketones, ur NEGATIVE NEGATIVE mg/dL   Protein, ur NEGATIVE NEGATIVE mg/dL   Nitrite NEGATIVE NEGATIVE   Leukocytes, UA NEGATIVE NEGATIVE  Lactic acid, plasma  Result Value Ref Range   Lactic Acid, Venous 1.2 0.5 - 2.0 mmol/L  Brain natriuretic peptide  Result Value Ref Range   B Natriuretic Peptide 403.1 (H) 0.0 - 100.0 pg/mL  I-stat troponin, ED (not at [facility])  Result Value Ref Range   Troponin i, poc 0.03 0.00 - 0.08 ng/mL   Comment 3          I-Stat CG4 Lactic Acid, ED (Not at [facility])  Result Value Ref Range   Lactic Acid, Venous 1.04 0.5 - 2.0 mmol/L     Imaging Review Dg Chest 2 View  02/14/2015  CLINICAL DATA:  Chest pain and shortness of breath. Wheezing and congestion EXAM: CHEST  2 VIEW COMPARISON:  08/28/2014 FINDINGS: Heart size is normal. Previous median sternotomy and CABG procedure. There are bilateral pleural effusions and interstitial edema. There are also patchy airspace opacities identified in both lungs. IMPRESSION: 1. CHF. 2. Bilateral airspace opacities which may represent areas of alveolar edema or superimposed infection. Electronically Signed   By: Kerby Moors M.D.   On: 02/14/2015 10:32   Ct Chest Wo Contrast  02/14/2015  CLINICAL DATA:  CHF versus pneumonia. Patient with hemoptysis. History of COPD. EXAM: CT CHEST WITHOUT CONTRAST TECHNIQUE: Multidetector CT imaging of the chest was performed following the standard protocol without IV contrast. COMPARISON:  Chest radiograph, 02/14/2015.  Chest CT, 08/25/2014. FINDINGS: Thoracic inlet: No mass or adenopathy. Status post left thyroidectomy. Right thyroid lobe is unremarkable. Mediastinum and hila: Heart is borderline enlarged. There are dense coronary artery  calcifications. Great vessels are normal caliber. Atherosclerotic calcifications are noted along the thoracic aorta and branch vessels. No mediastinal or hilar masses or pathologically enlarged lymph nodes. Small to moderate size hiatal hernia. Lungs and pleura: Minimal, right greater than left, pleural effusions. Patchy airspace consolidation is noted in the right lower lobe and posterior aspect of the right upper lobe, and to a lesser degree, right middle lobe. Dependent opacity in the left lower lobe is also noted that is likely due to atelectasis. There is hazy opacity in the left lower lobe superior segment similar to that seen in the right lower lobe. There is an area of focal opacity in the left upper lobe lingula consistent with chronic scarring. Small spiculated nodule in the left upper lobe, image 26, series 205, measuring 8 mm, new from the prior CT. No other lung nodules. There is moderate to advanced emphysema similar to the prior study. No pneumothorax. Limited upper abdomen: Low-density renal masses consistent with cysts. No liver lesion. No adrenal masses. Status postcholecystectomy. Musculoskeletal: Burst fracture of L1, severe, increased  in severity since the prior CT. Moderate to severe compression fracture of T7, stable. Slight depressions of the upper endplates of T4 through T6, stable. Bones are diffusely demineralized. No osteoblastic or osteolytic lesions. Stable changes from previous CABG surgery. IMPRESSION: 1. Multifocal pneumonia reflected by airspace opacity most evident in the posterior right upper lobe and right lower lobe, but also seen in the superior segment the left lower lobe and mildly in the right middle lobe. Associated minimal pleural effusions. 2. 8 mm nodule in the left upper lobe, new from the prior study, concerning for malignancy. Recommend consultation for possible biopsy. 3. There are areas of chronic lung scarring as well as moderate to advanced emphysema. 4. No  mediastinal or hilar masses or pathologically enlarged lymph nodes. 5. Thoracic and upper lumbar spine fractures as described. Electronically Signed   By: Lajean Manes M.D.   On: 02/14/2015 12:23   I have personally reviewed and evaluated these images and lab results as part of my medical decision-making.   EKG Interpretation   Date/Time:  Saturday February 14 2015 09:09:25 EST Ventricular Rate:  89 PR Interval:    QRS Duration: 96 QT Interval:  387 QTC Calculation: 471 R Axis:   -41 Text Interpretation:  Atrial flutter Left axis deviation Anterior infarct,  old Prolonged QT interval Confirmed by Rogene Houston  MD, Anndrea Mihelich [phone]) on  02/14/2015 9:12:25 AM      MDM   Final diagnoses:  HCAP (healthcare-associated pneumonia)    Patient with multiple concerns. 1 was onset of chest pain last night around 11:00. Troponins are negative EKG without significant acute changes. The other is fatigue weakness some increased confusion and wheezing congested cough. Patient's chest pain went away with the medications provided by EMS aspirin and nitroglycerin. Patient also received albuterol treatment with some improvement. Patient's always on 2 L of oxygen at home. Patient has a history significant for congestive heart failure COPD and atrial fib.  Workup up typically the CT chest shows evidence of pneumonia. Record chest x-ray was raising more concern for CHF. Patient did receive 80 mg of Lasix he normally takes Lasix every day. Patient was in a nursing facility within the last 3 months. So will be treated as a healthcare acquired pneumonia. Patient sore he had blood cultures. Lactic acid is normal. Patient is satting fine on his 2 L of oxygen. That is his normal amount. Antibiotics ordered we'll contact unassigned medicine for admission.     Fredia Sorrow, MD 02/14/15 1244

## 2015-02-14 NOTE — Progress Notes (Signed)
ANTIBIOTIC CONSULT NOTE - INITIAL  Pharmacy Consult for vancomycin  Indication: rule out pneumonia  Allergies  Allergen Reactions  . Xarelto [Rivaroxaban] Other (See Comments)    makes pt crazy  . Ticlopidine Hcl Swelling  . Lorazepam     Altered mental status from 2 doses of IV Ativan on 05/10/14.    Patient Measurements:   Adjusted Body Weight:   Vital Signs: Temp: 99.6 F (37.6 C) (11/19 0905) BP: 123/81 mmHg (11/19 1145) Pulse Rate: 73 (11/19 1145) Intake/Output from previous day:   Intake/Output from this shift:    Labs:  Recent Labs  02/14/15 0957  WBC 12.5*  HGB 10.1*  PLT 164  CREATININE 1.02   CrCl cannot be calculated (Unknown ideal weight.). No results for input(s): VANCOTROUGH, VANCOPEAK, VANCORANDOM, GENTTROUGH, GENTPEAK, GENTRANDOM, TOBRATROUGH, TOBRAPEAK, TOBRARND, AMIKACINPEAK, AMIKACINTROU, AMIKACIN in the last 72 hours.   Microbiology: No results found for this or any previous visit (from the past 720 hour(s)).  Medical History: Past Medical History  Diagnosis Date  . HYPERLIPIDEMIA 11/21/2006  . HYPERTENSION 11/21/2006  . MYOCARDIAL INFARCTION, HX OF 11/21/2006  . CORONARY ARTERY DISEASE 11/21/2006  . OSTEOPOROSIS 11/21/2006  . COLONIC POLYPS 05/21/2007  . COPD 05/21/2007  . HIATAL HERNIA 05/21/2007  . DIVERTICULAR DISEASE 05/21/2007  . NEPHROLITHIASIS 05/21/2007  . GI BLEEDING 09/09/2008  . ANEMIA, B12 DEFICIENCY 12/02/2008  . CAROTID ARTERY STENOSIS 04/16/2009  . PERIPHERAL VASCULAR DISEASE 09/02/2009  . Atrial fibrillation ([employer]) 11/06/2009  . Allergic rhinitis   . Osteoporosis   . Hemoptysis   . Internal hemorrhoids   . GERD (gastroesophageal reflux disease)   . Cataract   . Dysrhythmia     Afib  . Shortness of breath dyspnea   . Pneumonia   . Arthritis   . History of kidney stones   . Diabetes mellitus without complication ([place])     pt. states he is not diabetic    Medications:  Anti-infectives    Start     Dose/Rate Route Frequency  Ordered Stop   02/15/15 0200  vancomycin (VANCOCIN) 500 mg in sodium chloride 0.9 % 100 mL IVPB     500 mg 100 mL/hr over 60 Minutes Intravenous Every 12 hours 02/14/15 1303     02/14/15 1400  cefTAZidime (FORTAZ) 2 g in dextrose 5 % 50 mL IVPB     2 g 100 mL/hr over 30 Minutes Intravenous 3 times per day 02/14/15 1238     02/14/15 1315  vancomycin (VANCOCIN) IVPB 1000 mg/200 mL premix     1,000 mg 200 mL/hr over 60 Minutes Intravenous  Once 02/14/15 1303       Assessment: 45 yom presented to the ED with CP. To start empiric vancomycin + ceftazidime for possible HCAP. Pt is afebrile and WCB is elevated at 12.5. Lactic acid and Scr are normal.   Vanc 11/19>> Ceftaz 11/19>>  Goal of Therapy:  Vancomycin trough level 15-20 mcg/ml  Plan:  - Vanc 1gm IV x 1 then 500mg  IV Q12H - Continue ceftaz 2gm IV Q8H per MD - F/u renal fxn, C&S, clinical status and trough at [place], Rande Lawman 02/14/2015,1:05 PM

## 2015-02-14 NOTE — H&P (Signed)
Date: 02/14/2015               Patient Name:  [name] MRN: [id number]  DOB: 08/02/1940 Age / Sex: 74 y.o., male   PCP: Marin Olp, MD         Medical Service: Internal Medicine Teaching Service         Attending Physician: Dr. Axel Filler, MD    First Contact: Dr. Marlowe Sax Pager: [phone]  Second Contact: Dr. Genene Churn Pager: [phone]       After Hours (After 5p/  First Contact Pager: [phone]  weekends / holidays): Second Contact Pager: [phone]   Chief Complaint: cough, SOB, wheezing  History of Present Illness: Patient is a 74 yo M with a PMHx of HTN, HLD, MI, CAD, A-fib, COPD, CHF, carotid artery stenosis, PVD, B12 deficiency, GERD, nephrolithiasis, osteoporosis brought into [facility] by EMS after patient experienced nausea and sudden onset CP last night. Patient states he experienced nausea around 11 pm last night and L sided back/ shoulder pain around 3 am this morning. States his brother-in-law lives with him and called EMS early this morning. Denies having pain chest pain. Patient states he was told about 3 days ago he had an episode of "confusion, jumping off the bed, falling, and saying things to people." States he did not remember any of these events when he "woke up." Denies having any dysuria, frequency, or urgency. Patient also reports having worsening SOB, cough productive of grey sputum, and wheezing for the past 1 week. Reports having fatigue and chills. Denies having any fevers or recent sick contacts. Denies having any rhinorrhea or sneezing. Reports having a 50 pack year smoking history, quit 3-4 years ago. Patient has a history of falls and fractures (burst fracture of T1 and compression fracture of T7). States he has a walker at home but uses it "sometimes." Denies having any weakness, numbness, or tingling anywhere. Denies having any unintentional weight loss. Denies having any hematemesis, hematochezia, or melena.   EMS gave him ASA 325 mg and one dose of  nitroglycerin. He had audible wheezing and a congested cough at the time which cleared up with Albuterol and Atrovent.  Patient has a history of A-fib and is supposed to be on Pradaxa as per discharge summary from 08/2014. States his "blood thinner" was stopped when he was at [facility] 3 months ago and he is not sure why. Patient wears 2LO2 at home at all times.   Patient is followed at the [facility] for his primary care doctor. Followed by Dr. Burt Knack from cardiology.  Meds: Current Facility-Administered Medications  Medication Dose Route Frequency Provider Last Rate Last Dose  . acetaminophen (TYLENOL) tablet 500 mg  500 mg Oral Q6H PRN Tasrif Ahmed, MD      . albuterol (PROVENTIL) (2.5 MG/3ML) 0.083% nebulizer solution 2.5 mg  2.5 mg Nebulization Q6H PRN Tasrif Ahmed, MD      . amiodarone (PACERONE) tablet 200 mg  200 mg Oral Daily Tasrif Ahmed, MD   200 mg at 02/14/15 1447  . antiseptic oral rinse (CPC / CETYLPYRIDINIUM CHLORIDE 0.05%) solution 7 mL  7 mL Mouth Rinse q12n4p Axel Filler, MD      . atorvastatin (LIPITOR) tablet 40 mg  40 mg Oral Daily Tasrif Ahmed, MD   40 mg at 02/14/15 1447  . budesonide-formoterol (SYMBICORT) 160-4.5 MCG/ACT inhaler 2 puff  2 puff Inhalation BID Tasrif Ahmed, MD      . busPIRone (BUSPAR) tablet  7.5 mg  7.5 mg Oral BID Dellia Nims, MD      . calcium-vitamin D (OSCAL WITH D) 500-200 MG-UNIT per tablet 1 tablet  1 tablet Oral Q breakfast Axel Filler, MD      . cefTAZidime (FORTAZ) 2 g in dextrose 5 % 50 mL IVPB  2 g Intravenous 3 times per day Fredia Sorrow, MD   2 g at 02/14/15 1451  . chlorhexidine (PERIDEX) 0.12 % solution 15 mL  15 mL Mouth Rinse BID Axel Filler, MD      . diltiazem (CARDIZEM CD) 24 hr capsule 180 mg  180 mg Oral Daily Tasrif Ahmed, MD   180 mg at 02/14/15 1448  . enoxaparin (LOVENOX) injection 40 mg  40 mg Subcutaneous Q24H Tasrif Ahmed, MD      . feeding supplement (ENSURE ENLIVE) (ENSURE ENLIVE) liquid 237  mL  237 mL Oral BID BM Axel Filler, MD      . Derrill Memo ON 02/15/2015] Influenza vac split quadrivalent PF (FLUARIX) injection 0.5 mL  0.5 mL Intramuscular Tomorrow-1000 Axel Filler, MD      . insulin aspart (novoLOG) injection 0-9 Units  0-9 Units Subcutaneous TID WC Tasrif Ahmed, MD      . insulin detemir (LEVEMIR) injection 5 Units  5 Units Subcutaneous Daily Tasrif Ahmed, MD   5 Units at 02/14/15 1447  . loratadine (CLARITIN) tablet 10 mg  10 mg Oral Daily Tasrif Ahmed, MD   10 mg at 02/14/15 1447  . pantoprazole (PROTONIX) EC tablet 40 mg  40 mg Oral BID Tasrif Ahmed, MD   40 mg at 02/14/15 1447  . tiotropium (SPIRIVA) inhalation capsule 18 mcg  1 capsule Inhalation Daily Tasrif Ahmed, MD      . Derrill Memo ON 02/15/2015] vancomycin (VANCOCIN) 500 mg in sodium chloride 0.9 % 100 mL IVPB  500 mg Intravenous Q12H Rachel L Rumbarger, RPH      . vitamin B-12 (CYANOCOBALAMIN) tablet 2,000 mcg  2,000 mcg Oral Daily Tasrif Ahmed, MD   2,000 mcg at 02/14/15 1447    Allergies: Allergies as of 02/14/2015 - Review Complete 02/14/2015  Allergen Reaction Noted  . Xarelto [rivaroxaban] Other (See Comments) 03/17/2014  . Ticlopidine hcl Swelling   . Lorazepam  05/15/2014   Past Medical History  Diagnosis Date  . HYPERLIPIDEMIA 11/21/2006  . HYPERTENSION 11/21/2006  . MYOCARDIAL INFARCTION, HX OF 11/21/2006  . CORONARY ARTERY DISEASE 11/21/2006  . OSTEOPOROSIS 11/21/2006  . COLONIC POLYPS 05/21/2007  . COPD 05/21/2007  . HIATAL HERNIA 05/21/2007  . DIVERTICULAR DISEASE 05/21/2007  . NEPHROLITHIASIS 05/21/2007  . GI BLEEDING 09/09/2008  . ANEMIA, B12 DEFICIENCY 12/02/2008  . CAROTID ARTERY STENOSIS 04/16/2009  . PERIPHERAL VASCULAR DISEASE 09/02/2009  . Atrial fibrillation ([place]) 11/06/2009  . Allergic rhinitis   . Osteoporosis   . Hemoptysis   . Internal hemorrhoids   . GERD (gastroesophageal reflux disease)   . Cataract   . Dysrhythmia     Afib  . Shortness of breath dyspnea   . Pneumonia    . Arthritis   . History of kidney stones   . Diabetes mellitus without complication ([place])     pt. states he is not diabetic   Past Surgical History  Procedure Laterality Date  . Thyroidectomy      partial  . Coronary stent placement      03/07/2001  . Cholecystectomy    . Appendectomy    . Hemorrhoid surgery    . Inguinal hernia  repair  10/10/08  . Coronary artery bypass graft  2011  . Ptca    . Cardioversion N/A 04/16/2014    Procedure: CARDIOVERSION;  Surgeon: Josue Hector, MD;  Location: [place];  Service: Cardiovascular;  Laterality: N/A;  . Olecranon bursectomy  07/09/2014  . Mass excision Left 07/09/2014    Procedure: LEFT ELBOW BURSECTOMY EXCISION MASS;  Surgeon: Iran Planas, MD;  Location: [place];  Service: Orthopedics;  Laterality: Left;   Family History  Problem Relation Age of Onset  . Arthritis Mother     rheumatoid  . Heart attack Father   . Heart disease Father   . Heart disease Brother   . Colon cancer Brother 66  . Stomach cancer Neg Hx   . Heart disease Brother    Social History   Social History  . Marital Status: Widowed    Spouse Name: N/A  . Number of Children: N/A  . Years of Education: N/A   Occupational History  . retired from [employer] and [employer]  . Smoking status: Former Smoker -- 3.00 packs/day for 50 years    Types: Cigarettes    Quit date: 09/25/2009  . Smokeless tobacco: Never Used  . Alcohol Use: 3.0 oz/week    6 Standard drinks or equivalent per week     Comment: [place] 2-3 a night  . Drug Use: No  . Sexual Activity: Not on file   Other Topics Concern  . Not on file   Social History Narrative   Widower in 2009. 3 kids. 4 grandkids. No greatgrandkids. No pets.    Lives with Sharyl Nimrod, brother in law.    Drives [name] to the store who shops for groceries. Prepares all of own food together.    ADLS independent. Son Belmin helps with finances.       Advance-does not want resuscitation.     HCPOA-no      Retired from [employer] and [employer].       Hobbies: former Air cabin crew, used to fish, tv-old westerns, reading    Review of Systems: Review of Systems  Constitutional: Positive for chills and malaise/fatigue. Negative for fever and weight loss.  HENT: Negative for ear pain.   Eyes: Negative for blurred vision and pain.  Respiratory: Positive for cough, sputum production, shortness of breath and wheezing.   Cardiovascular: Negative for chest pain and leg swelling.  Gastrointestinal: Negative for nausea, vomiting, abdominal pain, diarrhea, constipation, blood in stool and melena.  Genitourinary: Negative for dysuria, urgency and frequency.  Musculoskeletal: Positive for falls.       L shoulder/ L upper back pain  Skin: Negative for itching and rash.  Neurological: Negative for tingling, sensory change, focal weakness and headaches.    Physical Exam: Blood pressure 101/59, pulse 70, temperature 97.7 F (36.5 C), temperature source Oral, resp. rate 22, SpO2 93 %. Physical Exam  Constitutional: He is oriented to person, place, and time. No distress.  Eyes: EOM are normal. Pupils are equal, round, and reactive to light.  Neck: Neck supple. No tracheal deviation present.  Cardiovascular: Normal rate and intact distal pulses.   Irregular rhythm   Pulmonary/Chest: Effort normal.  On 2L O2 via [place] Rhonchi and scattered wheezing   Abdominal: Soft. Bowel sounds are normal. He exhibits no distension. There is no tenderness.  Musculoskeletal: He exhibits no edema.  Cervical and Thoracic spine non-tender to palpation.  L scapula non-tender to palpation. Normal ROM of L shoulder joint.  Neurological: He is alert and oriented to person, place, and time.  Skin: Skin is warm and dry.    Lab results: Basic Metabolic Panel:  Recent Labs  02/14/15 0957  NA 133*  K 4.0  CL 96*  CO2 28  GLUCOSE 160*  BUN 18  CREATININE 1.02  CALCIUM 8.1*   Liver Function Tests:  Recent Labs   02/14/15 0957  AST 24  ALT 29  ALKPHOS 47  BILITOT 1.3*  PROT 6.1*  ALBUMIN 2.8*   CBC:  Recent Labs  02/14/15 0957  WBC 12.5*  NEUTROABS 10.6*  HGB 10.1*  HCT 33.1*  MCV 86.0  PLT 164   Urinalysis:  Recent Labs  02/14/15 1133  COLORURINE YELLOW  LABSPEC 1.015  PHURINE 6.0  GLUCOSEU NEGATIVE  HGBUR NEGATIVE  BILIRUBINUR NEGATIVE  KETONESUR NEGATIVE  PROTEINUR NEGATIVE  NITRITE NEGATIVE  LEUKOCYTESUR NEGATIVE   Imaging results:  Dg Chest 2 View  02/14/2015  CLINICAL DATA:  Chest pain and shortness of breath. Wheezing and congestion EXAM: CHEST  2 VIEW COMPARISON:  08/28/2014 FINDINGS: Heart size is normal. Previous median sternotomy and CABG procedure. There are bilateral pleural effusions and interstitial edema. There are also patchy airspace opacities identified in both lungs. IMPRESSION: 1. CHF. 2. Bilateral airspace opacities which may represent areas of alveolar edema or superimposed infection. Electronically Signed   By: Kerby Moors M.D.   On: 02/14/2015 10:32   Ct Chest Wo Contrast  02/14/2015  CLINICAL DATA:  CHF versus pneumonia. Patient with hemoptysis. History of COPD. EXAM: CT CHEST WITHOUT CONTRAST TECHNIQUE: Multidetector CT imaging of the chest was performed following the standard protocol without IV contrast. COMPARISON:  Chest radiograph, 02/14/2015.  Chest CT, 08/25/2014. FINDINGS: Thoracic inlet: No mass or adenopathy. Status post left thyroidectomy. Right thyroid lobe is unremarkable. Mediastinum and hila: Heart is borderline enlarged. There are dense coronary artery calcifications. Great vessels are normal caliber. Atherosclerotic calcifications are noted along the thoracic aorta and branch vessels. No mediastinal or hilar masses or pathologically enlarged lymph nodes. Small to moderate size hiatal hernia. Lungs and pleura: Minimal, right greater than left, pleural effusions. Patchy airspace consolidation is noted in the right lower lobe and  posterior aspect of the right upper lobe, and to a lesser degree, right middle lobe. Dependent opacity in the left lower lobe is also noted that is likely due to atelectasis. There is hazy opacity in the left lower lobe superior segment similar to that seen in the right lower lobe. There is an area of focal opacity in the left upper lobe lingula consistent with chronic scarring. Small spiculated nodule in the left upper lobe, image 26, series 205, measuring 8 mm, new from the prior CT. No other lung nodules. There is moderate to advanced emphysema similar to the prior study. No pneumothorax. Limited upper abdomen: Low-density renal masses consistent with cysts. No liver lesion. No adrenal masses. Status postcholecystectomy. Musculoskeletal: Burst fracture of L1, severe, increased in severity since the prior CT. Moderate to severe compression fracture of T7, stable. Slight depressions of the upper endplates of T4 through T6, stable. Bones are diffusely demineralized. No osteoblastic or osteolytic lesions. Stable changes from previous CABG surgery. IMPRESSION: 1. Multifocal pneumonia reflected by airspace opacity most evident in the posterior right upper lobe and right lower lobe, but also seen in the superior segment the left lower lobe and mildly in the right middle lobe. Associated minimal pleural effusions. 2. 8 mm nodule in the left upper lobe, new from  the prior study, concerning for malignancy. Recommend consultation for possible biopsy. 3. There are areas of chronic lung scarring as well as moderate to advanced emphysema. 4. No mediastinal or hilar masses or pathologically enlarged lymph nodes. 5. Thoracic and upper lumbar spine fractures as described. Electronically Signed   By: Lajean Manes M.D.   On: 02/14/2015 12:23    Other results: EKG: Atrial flutter, prolonged QT interval   Assessment & Plan by Problem: Principal Problem:   HCAP (healthcare-associated pneumonia) Active Problems:    Hyperlipidemia   ANEMIA, B12 DEFICIENCY   Hx of CABG   Osteoporosis   Diastolic CHF ([place])   Diabetes mellitus type II, controlled ([place])   COPD (chronic obstructive pulmonary disease) (HCC)   Chronic atrial fibrillation (H[facility])   Compression fracture of L1 lumbar vertebra (H[facility])   CAD (coronary artery disease)  CAP Patient presenting with a 1 week history of worsening SOB, cough productive of grey sputum, wheezing, chills, and malaise. CXR showing areas of alveolar edema/ superimposed infection. CT of chest showing multifocal pneumonia and minimal pleural effusions. Also areas of chronic lung scarring and moderate-advanced emphysema. He has mild leukocytosis (WBC 12.5). Satting 93-96% on 2L O2 via [place]. Lung exam remarkable for rhonchi and diffuse scattered wheezing. Symptoms not likely 2/2 ACS because patient denies having any chest pain. EKG at the time of admission showing atrial flutter and prolonged QT interval but no acute ST/ T wave changes.Troponin 0.03.  -Tele  -Ceftriaxone 1 g IV every 24 hours -Azithromycin 500 mg today, then 250 mg daily for 4 days -Albuterol nebulizer -Budesonide-formoterol inhaler -Spiriva  -Trend troponin x3 -CBC in am   COPD Patient has a 50 pack year smoking history, quit 3-4 years ago. CT of lungs showing chronic lung scarring and moderate-advanced emphysema. -Albuterol nebulizer -Budesonide-formoterol inhaler -Spiriva   Pulmonary nodule  CT of chest showing a 8 mm nodule in the L upper lobe (new from prior study). Nodule suspicious for possible malignancy because patient has a 50 pack year smoking history.  -Speak to patient about pursuing further workup such as biopsy of the nodule   History of falls and fractures Burst fracture of T1 and compression fracture of T7 seen on imaging. Patient is complaining of L sided upper back/ shoulder pain. However, no pain on palpation and normal ROM on physical exam.   Afib EKG showing atrial flutter and QT  prolongation (QTc 471).  -Amiodarone 200 mg daily -Cardizem 180 mg daily   CAD/ CHF -cardizem 180 mg daily  -Hold Lasix 40 mg daily   Normocytic anemia Patient's Hgb in the 13s (up until 07/2014). Hgb 10.1 today. Patient denies having any unintentional weight loss, hematemesis, hematochezia, or melena. -F/u FOBT -Anemia panel   HLD -Lipitor 40 mg daily  DM -Levemir 5 u daily  -SSI -CBG monitoring   GERD -Protonix 40 mg BID  Osteoporosis  -Fosamax 70 mg qweekly -Caltrate 600 +D plus  Anxiety -Buspar 7.5 mg BID  Allergies -Claritin 10 mg daily  B12 deficiency -B12 2000 mcg daily  DVT ppx -Lovenox   Diet -cardiac -ensure   Code: DNR  Dispo: Disposition is deferred at this time, awaiting improvement of current medical problems. Anticipated discharge in approximately 2-3 day(s).   The patient does have a current PCP Marin Olp, MD) and does need an [facility] hospital follow-up appointment after discharge.  The patient does not have transportation limitations that hinder transportation to clinic appointments.  Signed: Shela Leff, MD 02/14/2015, 3:55 PM

## 2015-02-15 DIAGNOSIS — D509 Iron deficiency anemia, unspecified: Secondary | ICD-10-CM | POA: Diagnosis present

## 2015-02-15 LAB — GLUCOSE, CAPILLARY
GLUCOSE-CAPILLARY: 264 mg/dL — AB (ref 65–99)
GLUCOSE-CAPILLARY: 138 mg/dL — AB (ref 65–99)
Glucose-Capillary: 111 mg/dL — ABNORMAL HIGH (ref 65–99)
Glucose-Capillary: 142 mg/dL — ABNORMAL HIGH (ref 65–99)

## 2015-02-15 LAB — COMPREHENSIVE METABOLIC PANEL
ALK PHOS: 48 U/L (ref 38–126)
ALT: 27 U/L (ref 17–63)
AST: 19 U/L (ref 15–41)
Albumin: 2.5 g/dL — ABNORMAL LOW (ref 3.5–5.0)
Anion gap: 7 (ref 5–15)
BILIRUBIN TOTAL: 0.6 mg/dL (ref 0.3–1.2)
BUN: 22 mg/dL — AB (ref 6–20)
CALCIUM: 8 mg/dL — AB (ref 8.9–10.3)
CO2: 33 mmol/L — ABNORMAL HIGH (ref 22–32)
CREATININE: 1.07 mg/dL (ref 0.61–1.24)
Chloride: 96 mmol/L — ABNORMAL LOW (ref 101–111)
GFR calc Af Amer: >60 mL/min (ref >60)
GLUCOSE: 184 mg/dL — AB (ref 65–99)
POTASSIUM: 3.4 mmol/L — AB (ref 3.5–5.1)
Sodium: 136 mmol/L (ref 135–145)
TOTAL PROTEIN: 5.8 g/dL — AB (ref 6.5–8.1)

## 2015-02-15 LAB — IRON AND TIBC
IRON: 14 ug/dL — AB (ref 45–182)
Saturation Ratios: 4 % — ABNORMAL LOW (ref 17.9–39.5)
TIBC: 329 ug/dL (ref 250–450)
UIBC: 315 ug/dL

## 2015-02-15 LAB — CBC WITH DIFFERENTIAL/PLATELET
BASOS ABS: 0 10*3/uL (ref 0.0–0.1)
BASOS PCT: 0 %
EOS ABS: 0.2 10*3/uL (ref 0.0–0.7)
EOS PCT: 2 %
HCT: 32.3 % — ABNORMAL LOW (ref 39.0–52.0)
Hemoglobin: 9.7 g/dL — ABNORMAL LOW (ref 13.0–17.0)
LYMPHS PCT: 8 %
Lymphs Abs: 0.8 10*3/uL (ref 0.7–4.0)
MCH: 25.9 pg — ABNORMAL LOW (ref 26.0–34.0)
MCHC: 30 g/dL (ref 30.0–36.0)
MCV: 86.1 fL (ref 78.0–100.0)
MONO ABS: 1.2 10*3/uL — AB (ref 0.1–1.0)
Monocytes Relative: 11 %
Neutro Abs: 8.1 10*3/uL — ABNORMAL HIGH (ref 1.7–7.7)
Neutrophils Relative %: 79 %
PLATELETS: 189 10*3/uL (ref 150–400)
RBC: 3.75 MIL/uL — AB (ref 4.22–5.81)
RDW: 18 % — AB (ref 11.5–15.5)
WBC: 10.2 10*3/uL (ref 4.0–10.5)

## 2015-02-15 LAB — URINE CULTURE

## 2015-02-15 LAB — RETICULOCYTES
RBC.: 3.75 MIL/uL — ABNORMAL LOW (ref 4.22–5.81)
RETIC COUNT ABSOLUTE: 45 10*3/uL (ref 19.0–186.0)
Retic Ct Pct: 1.2 % (ref 0.4–3.1)

## 2015-02-15 LAB — FOLATE: Folate: 16.1 ng/mL (ref >5.9)

## 2015-02-15 LAB — FERRITIN: FERRITIN: 67 ng/mL (ref 24–336)

## 2015-02-15 LAB — VITAMIN B12: VITAMIN B 12: 1133 pg/mL — AB (ref 180–914)

## 2015-02-15 MED ORDER — FERROUS SULFATE 325 (65 FE) MG PO TABS
325.0000 mg | ORAL_TABLET | Freq: Every day | ORAL | Status: DC
  Administered 2015-02-15 – 2015-02-18 (×4): 325 mg via ORAL
  Filled 2015-02-15 (×4): qty 1

## 2015-02-15 MED ORDER — POTASSIUM CHLORIDE CRYS ER 20 MEQ PO TBCR
40.0000 meq | EXTENDED_RELEASE_TABLET | Freq: Once | ORAL | Status: AC
  Administered 2015-02-15: 40 meq via ORAL
  Filled 2015-02-15: qty 2

## 2015-02-15 NOTE — H&P (Signed)
Subjective: Patient was seen and examined at bedside today. States his SOB and cough have improved since yesterday. He is tolerating PO intake well. No other complaints.   Objective: Vital signs in last 24 hours: Filed Vitals:   02/14/15 1948 02/14/15 2044 02/15/15 0455 02/15/15 0841  BP:  134/72 113/69 111/60  Pulse:  86 69 71  Temp:  98 F (36.7 C) 97.7 F (36.5 C) 98.7 F (37.1 C)  TempSrc:  Oral Oral Oral  Resp:  18 16 16   Weight:  142 lb (64.411 kg)    SpO2: 94% 92% 98% 96%   Weight change:   Intake/Output Summary (Last 24 hours) at 02/15/15 0957 Last data filed at 02/15/15 0841  Gross per 24 hour  Intake    580 ml  Output    701 ml  Net   -121 ml   Physical Exam: Constitutional: He is oriented to person, place, and time. No distress.   Cardiovascular: Normal rate and intact distal pulses.  Irregular rhythm  Pulmonary/Chest: Effort normal.  On 2L O2 via [place] Diffuse coarse crackles and minimal wheezing(improved from yesterday) Abdominal: Soft. Bowel sounds are normal. He exhibits no distension. There is no tenderness.  Musculoskeletal: He exhibits no edema.  Skin: Skin is warm and dry.   Lab Results: Basic Metabolic Panel:  Recent Labs Lab 02/14/15 0957 02/14/15 1533 02/15/15 0344  NA 133*  --  136  K 4.0  --  3.4*  CL 96*  --  96*  CO2 28  --  33*  GLUCOSE 160*  --  184*  BUN 18  --  22*  CREATININE 1.02 1.15 1.07  CALCIUM 8.1*  --  8.0*   Liver Function Tests:  Recent Labs Lab 02/14/15 0957 02/15/15 0344  AST 24 19  ALT 29 27  ALKPHOS 47 48  BILITOT 1.3* 0.6  PROT 6.1* 5.8*  ALBUMIN 2.8* 2.5*   CBC:  Recent Labs Lab 02/14/15 0957 02/14/15 1533 02/15/15 0344  WBC 12.5* 9.9 10.2  NEUTROABS 10.6*  --  8.1*  HGB 10.1* 9.8* 9.7*  HCT 33.1* 32.2* 32.3*  MCV 86.0 86.3 86.1  PLT 164 166 189   Cardiac Enzymes:  Recent Labs Lab 02/14/15 1533 02/14/15 2148  TROPONINI 0.03 0.03   CBG:  Recent Labs Lab 02/14/15 1637  02/14/15 2106 02/15/15 0735  GLUCAP 150* 142* 111*   Anemia Panel:  Recent Labs Lab 02/15/15 0344  VITAMINB12 1133*  FOLATE 16.1  FERRITIN 67  TIBC 329  IRON 14*  RETICCTPCT 1.2   Urinalysis:  Recent Labs Lab 02/14/15 1133  COLORURINE YELLOW  LABSPEC 1.015  PHURINE 6.0  GLUCOSEU NEGATIVE  HGBUR NEGATIVE  BILIRUBINUR NEGATIVE  KETONESUR NEGATIVE  PROTEINUR NEGATIVE  NITRITE NEGATIVE  LEUKOCYTESUR NEGATIVE   Micro Results: No results found for this or any previous visit (from the past 240 hour(s)). Studies/Results: Dg Chest 2 View  02/14/2015  CLINICAL DATA:  Chest pain and shortness of breath. Wheezing and congestion EXAM: CHEST  2 VIEW COMPARISON:  08/28/2014 FINDINGS: Heart size is normal. Previous median sternotomy and CABG procedure. There are bilateral pleural effusions and interstitial edema. There are also patchy airspace opacities identified in both lungs. IMPRESSION: 1. CHF. 2. Bilateral airspace opacities which may represent areas of alveolar edema or superimposed infection. Electronically Signed   By: Kerby Moors M.D.   On: 02/14/2015 10:32   Ct Chest Wo Contrast  02/14/2015  CLINICAL DATA:  CHF versus pneumonia. Patient with hemoptysis. History  of COPD. EXAM: CT CHEST WITHOUT CONTRAST TECHNIQUE: Multidetector CT imaging of the chest was performed following the standard protocol without IV contrast. COMPARISON:  Chest radiograph, 02/14/2015.  Chest CT, 08/25/2014. FINDINGS: Thoracic inlet: No mass or adenopathy. Status post left thyroidectomy. Right thyroid lobe is unremarkable. Mediastinum and hila: Heart is borderline enlarged. There are dense coronary artery calcifications. Great vessels are normal caliber. Atherosclerotic calcifications are noted along the thoracic aorta and branch vessels. No mediastinal or hilar masses or pathologically enlarged lymph nodes. Small to moderate size hiatal hernia. Lungs and pleura: Minimal, right greater than left, pleural  effusions. Patchy airspace consolidation is noted in the right lower lobe and posterior aspect of the right upper lobe, and to a lesser degree, right middle lobe. Dependent opacity in the left lower lobe is also noted that is likely due to atelectasis. There is hazy opacity in the left lower lobe superior segment similar to that seen in the right lower lobe. There is an area of focal opacity in the left upper lobe lingula consistent with chronic scarring. Small spiculated nodule in the left upper lobe, image 26, series 205, measuring 8 mm, new from the prior CT. No other lung nodules. There is moderate to advanced emphysema similar to the prior study. No pneumothorax. Limited upper abdomen: Low-density renal masses consistent with cysts. No liver lesion. No adrenal masses. Status postcholecystectomy. Musculoskeletal: Burst fracture of L1, severe, increased in severity since the prior CT. Moderate to severe compression fracture of T7, stable. Slight depressions of the upper endplates of T4 through T6, stable. Bones are diffusely demineralized. No osteoblastic or osteolytic lesions. Stable changes from previous CABG surgery. IMPRESSION: 1. Multifocal pneumonia reflected by airspace opacity most evident in the posterior right upper lobe and right lower lobe, but also seen in the superior segment the left lower lobe and mildly in the right middle lobe. Associated minimal pleural effusions. 2. 8 mm nodule in the left upper lobe, new from the prior study, concerning for malignancy. Recommend consultation for possible biopsy. 3. There are areas of chronic lung scarring as well as moderate to advanced emphysema. 4. No mediastinal or hilar masses or pathologically enlarged lymph nodes. 5. Thoracic and upper lumbar spine fractures as described. Electronically Signed   By: Lajean Manes M.D.   On: 02/14/2015 12:23   Medications: I have reviewed the patient's current medications. Scheduled Meds: . albuterol  2.5 mg  Nebulization Q4H  . amiodarone  200 mg Oral Daily  . antiseptic oral rinse  7 mL Mouth Rinse q12n4p  . atorvastatin  40 mg Oral Daily  . azithromycin  250 mg Oral Daily  . budesonide-formoterol  2 puff Inhalation BID  . busPIRone  7.5 mg Oral BID  . calcium-vitamin D  1 tablet Oral Q breakfast  . cefTRIAXone (ROCEPHIN)  IV  1 g Intravenous Q24H  . chlorhexidine  15 mL Mouth Rinse BID  . diltiazem  180 mg Oral Daily  . enoxaparin (LOVENOX) injection  40 mg Subcutaneous Q24H  . feeding supplement (ENSURE ENLIVE)  237 mL Oral BID BM  . ferrous sulfate  325 mg Oral QHS  . furosemide  40 mg Oral Daily  . Influenza vac split quadrivalent PF  0.5 mL Intramuscular Tomorrow-1000  . insulin aspart  0-9 Units Subcutaneous TID WC  . insulin detemir  5 Units Subcutaneous Daily  . loratadine  10 mg Oral Daily  . pantoprazole  40 mg Oral BID  . potassium chloride  40 mEq Oral  Once  . tiotropium  1 capsule Inhalation Daily  . cyanocobalamin  2,000 mcg Oral Daily   Continuous Infusions:  PRN Meds:.acetaminophen, albuterol Assessment/Plan: Principal Problem:   CAP (community acquired pneumonia) Active Problems:   Hyperlipidemia   ANEMIA, B12 DEFICIENCY   Essential hypertension   Hx of CABG   Osteoporosis   Diastolic CHF ([place])   Diabetes mellitus type II, controlled ([place])   COPD (chronic obstructive pulmonary disease) (HCC)   Chronic atrial fibrillation (HCC)   Compression fracture of L1 lumbar vertebra (HCC)   CAD (coronary artery disease)   Iron deficiency anemia  CAP Patient presenting with a 1 week history of worsening SOB, cough productive of grey sputum, wheezing, chills, and malaise. CXR showing areas of alveolar edema/ superimposed infection. CT of chest showing multifocal pneumonia and minimal pleural effusions. Also areas of chronic lung scarring and moderate-advanced emphysema. He had mild leukocytosis (WBC 12.5) on admission. Symptoms not likely 2/2 ACS because patient denies  having any chest pain. EKG at the time of admission showing atrial flutter and prolonged QT interval but no acute ST/ T wave changes.Troponin x 3 (0.03). Exam today showing diffuse coarse crackles and minimal wheezing (improved from yesterday). Patient is satting 92-98% on 2L via [place]. He is on 2L O2 at home.  -Ceftriaxone 1 g IV every 24 hours -Azithromycin 500 mg today, then 250 mg daily for 4 days -Albuterol nebulizer -Budesonide-formoterol inhaler -Spiriva  -Trend troponin x3 -CBC in am -PT/OT to ambulate patient    COPD Patient has a 50 pack year smoking history, quit 3-4 years ago. CT of lungs showing chronic lung scarring and moderate-advanced emphysema. -Albuterol nebulizer -Budesonide-formoterol inhaler -Spiriva   Pulmonary nodule  CT of chest showing a 8 mm nodule in the L upper lobe (new from prior study). Nodule suspicious for possible malignancy because patient has a 50 pack year smoking history. Discussed CT findings with the patient today and he expressed his understanding.  -Will suggest PCP to pursue further workup such as PET scan and biopsy of the nodule as outpatient.  History of falls and fractures Burst fracture of T1 and compression fracture of T7 seen on imaging. However, no pain on palpation and normal ROM on physical exam. Patient did not complain of any back or shoulder pain today.   Afib EKG showing atrial flutter and QT prolongation (QTc 471).  -Amiodarone 200 mg daily -Cardizem 180 mg daily   CAD/ CHF -cardizem 180 mg daily  -Hold Lasix 40 mg daily   Normocytic anemia Patient's Hgb in the 13s (up until 07/2014). Hgb 10.1 on admission. Patient denies having any unintentional weight loss, hematemesis, hematochezia, or melena. Anemia panel showing low iron, low saturation, ferritin on the lower limit of normal. TIBC normal.  -Pending FOBT  HLD -Lipitor 40 mg daily  DM -Levemir 5 u daily  -SSI -CBG monitoring   GERD -Protonix 40 mg  BID  Osteoporosis  -Fosamax 70 mg qweekly -Caltrate 600 +D plus  Anxiety -Buspar 7.5 mg BID  Allergies -Claritin 10 mg daily  B12 deficiency -B12 2000 mcg daily  DVT ppx -Lovenox   Diet -cardiac -ensure   Code: DNR   Dispo: Disposition is deferred at this time, awaiting improvement of current medical problems.  Anticipated discharge in approximately 1-2 day(s).   The patient does have a current PCP Marin Olp, MD) and does need an [facility] hospital follow-up appointment after discharge.  The patient does not have transportation limitations that hinder transportation to  clinic appointments.  .Services Needed at time of discharge: Y = Yes, Blank = No PT:   OT:   RN:   Equipment:   Other:     LOS: 1 day   Shela Leff, MD 02/15/2015, 9:57 AM

## 2015-02-15 NOTE — Progress Notes (Signed)
Subjective: Patient was seen and examined at bedside today. States his SOB and cough have improved since yesterday. He is tolerating PO intake well. No other complaints.   Objective: Vital signs in last 24 hours: Filed Vitals:   02/14/15 1948 02/14/15 2044 02/15/15 0455 02/15/15 0841  BP:  134/72 113/69 111/60  Pulse:  86 69 71  Temp:  98 F (36.7 C) 97.7 F (36.5 C) 98.7 F (37.1 C)  TempSrc:  Oral Oral Oral  Resp:  18 16 16   Weight:  142 lb (64.411 kg)    SpO2: 94% 92% 98% 96%   Weight change:   Intake/Output Summary (Last 24 hours) at 02/15/15 0957 Last data filed at 02/15/15 0841  Gross per 24 hour  Intake    580 ml  Output    701 ml  Net   -121 ml   Physical Exam: Constitutional: He is oriented to person, place, and time. No distress.   Cardiovascular: Normal rate and intact distal pulses.  Irregular rhythm  Pulmonary/Chest: Effort normal.  On 2L O2 via [place] Diffuse coarse crackles and minimal wheezing(improved from yesterday) Abdominal: Soft. Bowel sounds are normal. He exhibits no distension. There is no tenderness.  Musculoskeletal: He exhibits no edema.  Skin: Skin is warm and dry.   Lab Results: Basic Metabolic Panel:  Recent Labs Lab 02/14/15 0957 02/14/15 1533 02/15/15 0344  NA 133*  --  136  K 4.0  --  3.4*  CL 96*  --  96*  CO2 28  --  33*  GLUCOSE 160*  --  184*  BUN 18  --  22*  CREATININE 1.02 1.15 1.07  CALCIUM 8.1*  --  8.0*   Liver Function Tests:  Recent Labs Lab 02/14/15 0957 02/15/15 0344  AST 24 19  ALT 29 27  ALKPHOS 47 48  BILITOT 1.3* 0.6  PROT 6.1* 5.8*  ALBUMIN 2.8* 2.5*   CBC:  Recent Labs Lab 02/14/15 0957 02/14/15 1533 02/15/15 0344  WBC 12.5* 9.9 10.2  NEUTROABS 10.6*  --  8.1*  HGB 10.1* 9.8* 9.7*  HCT 33.1* 32.2* 32.3*  MCV 86.0 86.3 86.1  PLT 164 166 189   Cardiac Enzymes:  Recent Labs Lab 02/14/15 1533 02/14/15 2148  TROPONINI 0.03 0.03   CBG:  Recent Labs Lab 02/14/15 1637  02/14/15 2106 02/15/15 0735  GLUCAP 150* 142* 111*   Anemia Panel:  Recent Labs Lab 02/15/15 0344  VITAMINB12 1133*  FOLATE 16.1  FERRITIN 67  TIBC 329  IRON 14*  RETICCTPCT 1.2   Urinalysis:  Recent Labs Lab 02/14/15 1133  COLORURINE YELLOW  LABSPEC 1.015  PHURINE 6.0  GLUCOSEU NEGATIVE  HGBUR NEGATIVE  BILIRUBINUR NEGATIVE  KETONESUR NEGATIVE  PROTEINUR NEGATIVE  NITRITE NEGATIVE  LEUKOCYTESUR NEGATIVE   Micro Results: No results found for this or any previous visit (from the past 240 hour(s)). Studies/Results: Dg Chest 2 View  02/14/2015  CLINICAL DATA:  Chest pain and shortness of breath. Wheezing and congestion EXAM: CHEST  2 VIEW COMPARISON:  08/28/2014 FINDINGS: Heart size is normal. Previous median sternotomy and CABG procedure. There are bilateral pleural effusions and interstitial edema. There are also patchy airspace opacities identified in both lungs. IMPRESSION: 1. CHF. 2. Bilateral airspace opacities which may represent areas of alveolar edema or superimposed infection. Electronically Signed   By: Kerby Moors M.D.   On: 02/14/2015 10:32   Ct Chest Wo Contrast  02/14/2015  CLINICAL DATA:  CHF versus pneumonia. Patient with hemoptysis. History  of COPD. EXAM: CT CHEST WITHOUT CONTRAST TECHNIQUE: Multidetector CT imaging of the chest was performed following the standard protocol without IV contrast. COMPARISON:  Chest radiograph, 02/14/2015.  Chest CT, 08/25/2014. FINDINGS: Thoracic inlet: No mass or adenopathy. Status post left thyroidectomy. Right thyroid lobe is unremarkable. Mediastinum and hila: Heart is borderline enlarged. There are dense coronary artery calcifications. Great vessels are normal caliber. Atherosclerotic calcifications are noted along the thoracic aorta and branch vessels. No mediastinal or hilar masses or pathologically enlarged lymph nodes. Small to moderate size hiatal hernia. Lungs and pleura: Minimal, right greater than left, pleural  effusions. Patchy airspace consolidation is noted in the right lower lobe and posterior aspect of the right upper lobe, and to a lesser degree, right middle lobe. Dependent opacity in the left lower lobe is also noted that is likely due to atelectasis. There is hazy opacity in the left lower lobe superior segment similar to that seen in the right lower lobe. There is an area of focal opacity in the left upper lobe lingula consistent with chronic scarring. Small spiculated nodule in the left upper lobe, image 26, series 205, measuring 8 mm, new from the prior CT. No other lung nodules. There is moderate to advanced emphysema similar to the prior study. No pneumothorax. Limited upper abdomen: Low-density renal masses consistent with cysts. No liver lesion. No adrenal masses. Status postcholecystectomy. Musculoskeletal: Burst fracture of L1, severe, increased in severity since the prior CT. Moderate to severe compression fracture of T7, stable. Slight depressions of the upper endplates of T4 through T6, stable. Bones are diffusely demineralized. No osteoblastic or osteolytic lesions. Stable changes from previous CABG surgery. IMPRESSION: 1. Multifocal pneumonia reflected by airspace opacity most evident in the posterior right upper lobe and right lower lobe, but also seen in the superior segment the left lower lobe and mildly in the right middle lobe. Associated minimal pleural effusions. 2. 8 mm nodule in the left upper lobe, new from the prior study, concerning for malignancy. Recommend consultation for possible biopsy. 3. There are areas of chronic lung scarring as well as moderate to advanced emphysema. 4. No mediastinal or hilar masses or pathologically enlarged lymph nodes. 5. Thoracic and upper lumbar spine fractures as described. Electronically Signed   By: Lajean Manes M.D.   On: 02/14/2015 12:23   Medications: I have reviewed the patient's current medications. Scheduled Meds: . albuterol  2.5 mg  Nebulization Q4H  . amiodarone  200 mg Oral Daily  . antiseptic oral rinse  7 mL Mouth Rinse q12n4p  . atorvastatin  40 mg Oral Daily  . azithromycin  250 mg Oral Daily  . budesonide-formoterol  2 puff Inhalation BID  . busPIRone  7.5 mg Oral BID  . calcium-vitamin D  1 tablet Oral Q breakfast  . cefTRIAXone (ROCEPHIN)  IV  1 g Intravenous Q24H  . chlorhexidine  15 mL Mouth Rinse BID  . diltiazem  180 mg Oral Daily  . enoxaparin (LOVENOX) injection  40 mg Subcutaneous Q24H  . feeding supplement (ENSURE ENLIVE)  237 mL Oral BID BM  . ferrous sulfate  325 mg Oral QHS  . furosemide  40 mg Oral Daily  . Influenza vac split quadrivalent PF  0.5 mL Intramuscular Tomorrow-1000  . insulin aspart  0-9 Units Subcutaneous TID WC  . insulin detemir  5 Units Subcutaneous Daily  . loratadine  10 mg Oral Daily  . pantoprazole  40 mg Oral BID  . potassium chloride  40 mEq Oral  Once  . tiotropium  1 capsule Inhalation Daily  . cyanocobalamin  2,000 mcg Oral Daily   Continuous Infusions:  PRN Meds:.acetaminophen, albuterol Assessment/Plan: Principal Problem:   CAP (community acquired pneumonia) Active Problems:   Hyperlipidemia   ANEMIA, B12 DEFICIENCY   Essential hypertension   Hx of CABG   Osteoporosis   Diastolic CHF ([place])   Diabetes mellitus type II, controlled ([place])   COPD (chronic obstructive pulmonary disease) (HCC)   Chronic atrial fibrillation (HCC)   Compression fracture of L1 lumbar vertebra (HCC)   CAD (coronary artery disease)   Iron deficiency anemia  CAP Patient presenting with a 1 week history of worsening SOB, cough productive of grey sputum, wheezing, chills, and malaise. CXR showing areas of alveolar edema/ superimposed infection. CT of chest showing multifocal pneumonia and minimal pleural effusions. Also areas of chronic lung scarring and moderate-advanced emphysema. He had mild leukocytosis (WBC 12.5) on admission. Symptoms not likely 2/2 ACS because patient denies  having any chest pain. EKG at the time of admission showing atrial flutter and prolonged QT interval but no acute ST/ T wave changes.Troponin x 3 (0.03). Exam today showing diffuse coarse crackles and minimal wheezing (improved from yesterday). Patient is satting 92-98% on 2L via [place]. He is on 2L O2 at home. Bicarb 33 likely due to repiratory acidosis with metabolic compensation in the setting of CAP and hx of COPD.  -Ceftriaxone 1 g IV every 24 hours -Azithromycin 500 mg today, then 250 mg daily for 4 days -Albuterol nebulizer -Budesonide-formoterol inhaler -Spiriva  -Trend troponin x3 -CBC in am -PT/OT to ambulate patient    COPD Patient has a 50 pack year smoking history, quit 3-4 years ago. CT of lungs showing chronic lung scarring and moderate-advanced emphysema. -Albuterol nebulizer -Budesonide-formoterol inhaler -Spiriva   Pulmonary nodule  CT of chest showing a 8 mm nodule in the L upper lobe (new from prior study). Nodule suspicious for possible malignancy because patient has a 50 pack year smoking history. Discussed CT findings with the patient today and he expressed his understanding.  -Will suggest PCP to pursue further workup such as PET scan and biopsy of the nodule as outpatient.  History of falls and fractures Burst fracture of T1 and compression fracture of T7 seen on imaging. However, no pain on palpation and normal ROM on physical exam. Patient did not complain of any back or shoulder pain today.   Afib EKG showing atrial flutter and QT prolongation (QTc 471).  -Amiodarone 200 mg daily -Cardizem 180 mg daily   CAD/ CHF -cardizem 180 mg daily  -Hold Lasix 40 mg daily.  Hypokalemia K 3.4 today -Kdur 40 mg   Normocytic anemia Patient's Hgb in the 13s (up until 07/2014). Hgb 10.1 on admission. Patient denies having any unintentional weight loss, hematemesis, hematochezia, or melena. Anemia panel showing low iron, low saturation, ferritin on the lower limit of  normal. TIBC normal.  -Pending FOBT  HLD -Lipitor 40 mg daily  DM -Levemir 5 u daily  -SSI -CBG monitoring   GERD -Protonix 40 mg BID  Osteoporosis  -Fosamax 70 mg qweekly -Caltrate 600 +D plus  Anxiety -Buspar 7.5 mg BID  Allergies -Claritin 10 mg daily  B12 deficiency -B12 2000 mcg daily  DVT ppx -Lovenox   Diet -cardiac -ensure   Code: DNR   Dispo: Disposition is deferred at this time, awaiting improvement of current medical problems.  Anticipated discharge in approximately 1-2 day(s).   The patient does have a current  PCP Marin Olp, MD) and does need an [facility] hospital follow-up appointment after discharge.  The patient does not have transportation limitations that hinder transportation to clinic appointments.  .Services Needed at time of discharge: Y = Yes, Blank = No PT:   OT:   RN:   Equipment:   Other:     LOS: 1 day   Shela Leff, MD 02/15/2015, 9:57 AM

## 2015-02-16 ENCOUNTER — Ambulatory Visit (HOSPITAL_COMMUNITY): Payer: Medicare Other

## 2015-02-16 DIAGNOSIS — I482 Chronic atrial fibrillation: Secondary | ICD-10-CM

## 2015-02-16 DIAGNOSIS — E785 Hyperlipidemia, unspecified: Secondary | ICD-10-CM

## 2015-02-16 DIAGNOSIS — Z951 Presence of aortocoronary bypass graft: Secondary | ICD-10-CM

## 2015-02-16 DIAGNOSIS — J189 Pneumonia, unspecified organism: Secondary | ICD-10-CM

## 2015-02-16 DIAGNOSIS — I10 Essential (primary) hypertension: Secondary | ICD-10-CM

## 2015-02-16 DIAGNOSIS — E118 Type 2 diabetes mellitus with unspecified complications: Secondary | ICD-10-CM

## 2015-02-16 DIAGNOSIS — J431 Panlobular emphysema: Secondary | ICD-10-CM

## 2015-02-16 LAB — GLUCOSE, CAPILLARY
GLUCOSE-CAPILLARY: 118 mg/dL — AB (ref 65–99)
GLUCOSE-CAPILLARY: 135 mg/dL — AB (ref 65–99)
Glucose-Capillary: 173 mg/dL — ABNORMAL HIGH (ref 65–99)
Glucose-Capillary: 122 mg/dL — ABNORMAL HIGH (ref 65–99)

## 2015-02-16 LAB — BASIC METABOLIC PANEL
Anion gap: 6 (ref 5–15)
BUN: 16 mg/dL (ref 6–20)
CHLORIDE: 96 mmol/L — AB (ref 101–111)
CO2: 34 mmol/L — ABNORMAL HIGH (ref 22–32)
CREATININE: 0.9 mg/dL (ref 0.61–1.24)
Calcium: 8.2 mg/dL — ABNORMAL LOW (ref 8.9–10.3)
GFR calc Af Amer: >60 mL/min (ref >60)
GFR calc non Af Amer: >60 mL/min (ref >60)
GLUCOSE: 138 mg/dL — AB (ref 65–99)
POTASSIUM: 3.5 mmol/L (ref 3.5–5.1)
SODIUM: 136 mmol/L (ref 135–145)

## 2015-02-16 LAB — CBC
HCT: 31.9 % — ABNORMAL LOW (ref 39.0–52.0)
HEMOGLOBIN: 9.6 g/dL — AB (ref 13.0–17.0)
MCH: 25.9 pg — AB (ref 26.0–34.0)
MCHC: 30.1 g/dL (ref 30.0–36.0)
MCV: 86.2 fL (ref 78.0–100.0)
Platelets: 225 10*3/uL (ref 150–400)
RBC: 3.7 MIL/uL — AB (ref 4.22–5.81)
RDW: 18 % — ABNORMAL HIGH (ref 11.5–15.5)
WBC: 9.6 10*3/uL (ref 4.0–10.5)

## 2015-02-16 MED ORDER — DM-GUAIFENESIN ER 30-600 MG PO TB12
1.0000 | ORAL_TABLET | Freq: Two times a day (BID) | ORAL | Status: DC
  Administered 2015-02-16 – 2015-02-19 (×7): 1 via ORAL
  Filled 2015-02-16 (×7): qty 1

## 2015-02-16 MED ORDER — PREDNISONE 20 MG PO TABS
40.0000 mg | ORAL_TABLET | Freq: Every day | ORAL | Status: DC
  Administered 2015-02-16 – 2015-02-17 (×2): 40 mg via ORAL
  Filled 2015-02-16 (×2): qty 2

## 2015-02-16 MED ORDER — ALBUTEROL SULFATE (2.5 MG/3ML) 0.083% IN NEBU
2.5000 mg | INHALATION_SOLUTION | RESPIRATORY_TRACT | Status: DC | PRN
  Administered 2015-02-16 – 2015-02-17 (×2): 2.5 mg via RESPIRATORY_TRACT
  Filled 2015-02-16 (×2): qty 3

## 2015-02-16 MED ORDER — IPRATROPIUM-ALBUTEROL 0.5-2.5 (3) MG/3ML IN SOLN
3.0000 mL | Freq: Four times a day (QID) | RESPIRATORY_TRACT | Status: DC
  Administered 2015-02-16 – 2015-02-18 (×9): 3 mL via RESPIRATORY_TRACT
  Filled 2015-02-16 (×9): qty 3

## 2015-02-16 MED ORDER — ENSURE ENLIVE PO LIQD
237.0000 mL | Freq: Every day | ORAL | Status: DC
  Administered 2015-02-18: 237 mL via ORAL

## 2015-02-16 NOTE — Progress Notes (Signed)
Physical Therapy Treatment Patient Details Name: [name] MRN: [id number] DOB: 10-05-1940 Today's Date: 02/16/2015    History of Present Illness 74 yo M with a PMHx of HTN, HLD, MI, CAD, A-fib, COPD, CHF, carotid artery stenosis, PVD, B12 deficiency, GERD, nephrolithiasis, osteoporosis brought into [facility] by EMS after patient experienced nausea and sudden onset CP. Pt also dx with PNA    PT Comments    Pt with improved ambulation tolerance and SpO2 > 89% on 3Lo2 via [place]. Pt functioning at supervision/min guard level. Discussed energy conservation techniques to aide with bathing and adls.   Follow Up Recommendations  Home health PT;Supervision/Assistance - 24 hour     Equipment Recommendations  Rolling walker with 5" wheels    Recommendations for Other Services       Precautions / Restrictions Precautions Precautions: Fall Precaution Comments: monitor O2 Restrictions Weight Bearing Restrictions: No    Mobility  Bed Mobility Overal bed mobility: Needs Assistance Bed Mobility: Supine to Sit     Supine to sit: Supervision     General bed mobility comments: HOB elevated, increased time due to energy conservation  Transfers Overall transfer level: Needs assistance Equipment used: Rolling walker (2 wheeled) Transfers: Sit to/from Stand Sit to Stand: Supervision         General transfer comment: pt with good technique  Ambulation/Gait Ambulation/Gait assistance: Min guard Ambulation Distance (Feet): 100 Feet Assistive device: Rolling walker (2 wheeled) Gait Pattern/deviations: Step-through pattern Gait velocity: decreased Gait velocity interpretation: Below normal speed for age/gender General Gait Details: educated on energy conservation and pursed limped breathing. SpO2 >89% on 3Lo2 via [place]. 1 standing rest break   Stairs            Wheelchair Mobility    Modified Rankin (Stroke Patients Only)       Balance Overall balance assessment: Needs  assistance         Standing balance support: Bilateral upper extremity supported;During functional activity Standing balance-Leahy Scale: Fair                      Cognition Arousal/Alertness: Awake/alert Behavior During Therapy: WFL for tasks assessed/performed Overall Cognitive Status: Within Functional Limits for tasks assessed                      Exercises      General Comments        Pertinent Vitals/Pain Pain Assessment: No/denies pain    Home Living                      Prior Function            PT Goals (current goals can now be found in the care plan section) Acute Rehab PT Goals Patient Stated Goal: go home by thanksgiving Progress towards PT goals: Progressing toward goals    Frequency  Min 3X/week    PT Plan Current plan remains appropriate    Co-evaluation             End of Session Equipment Utilized During Treatment: Gait belt;Oxygen Activity Tolerance: Patient limited by fatigue Patient left: in chair;with call bell/phone within reach;with family/visitor present     Time: [id number] PT Time Calculation (min) (ACUTE ONLY): 23 min  Charges:  $Gait Training: 23-37 mins                    G Codes:      Rajesh Wyss, [place] Neu  02/16/2015, 4:34 PM   Kittie Plater, PT, DPT Pager #: [phone] Office #: [phone]

## 2015-02-16 NOTE — Clinical Social Work Note (Signed)
Clinical Social Work Assessment  Patient Details  Name: [name] MRN: [id number] Date of Birth: 19-Jan-1941  Date of referral:  02/15/15               Reason for consult:  Facility Placement                Permission sought to share information with:  Other (Patient oriented to person, place and time. CSW contacted son, Jeffery, Newman..) Permission granted to share information::  No (CSW talked with son as patient only oriented to person, place, and time)  Name::     [name], [name].  Agency::     Relationship::  Son  Contact Information:  [phone]  Housing/Transportation Living arrangements for the past 2 months:  [place] of Information:  Adult Children (Son [name].) Patient Interpreter Needed:  None Criminal Activity/Legal Involvement Pertinent to Current Situation/Hospitalization:  No - Comment as needed Significant Relationships:  Adult Children Lives with:  Other (Comment) (Brother-in-law [name]) Do you feel safe going back to the place where you live?  No (Son in agreement that ST rehab will be benefical to patient prior to returning home) Need for family participation in patient care:  Yes (Comment)  Care giving concerns:  None expressed by patient's son.   Social Worker assessment / plan:  CSW talked with Mr. Jeffery, Newman. by phone regardng discharge planning and recommendation of ST rehab.  Son in agreement with ST rehab and informed CSW that patient has been to [name] and [name] before. He explained that after his dad's last hospitalization (d/c'd 08/29/14), he went to [name] for approx. 30 days, then went to [name] and was d/c'd from there on 11/14/14.  Son's preference is [name], and when asked for a second choice, [name] indicated that he will talk with his dad and get back with CSW.   Employment status:  Retired Health visitor, Managed Care PT Recommendations:  [name] (PT recommends SNF &  OT recommends [name]) Information / Referral to community resources:  [name]  Patient/Family's Response to care:  Son did not express any concerns regarding patient's care.  Patient/Family's Understanding of and Emotional Response to Diagnosis, Current Treatment, and Prognosis:  Not discussed.  Emotional Assessment Appearance:  Appears stated age Attitude/Demeanor/Rapport:  Unable to Assess Affect (typically observed):  Unable to Assess Orientation:  Oriented to Self, Oriented to Place, Oriented to  Time Alcohol / Substance use:  Tobacco Use, Alcohol Use (Patient quit smoking 09/25/09 and drinks approx. 3.0 ounces of alcohol per week.) Psych involvement (Current and /or in the community):  No (Comment)  Discharge Needs  Concerns to be addressed:  Discharge Planning Concerns Readmission within the last 30 days:  No Current discharge risk:  None Barriers to Discharge:  No Barriers Identified   Jeffery Feil, LCSW 02/16/2015, 3:48 PM

## 2015-02-16 NOTE — Evaluation (Signed)
Occupational Therapy Evaluation Patient Details Name: [name] MRN: [id number] DOB: 1940/06/28 Today's Date: 02/16/2015    History of Present Illness 74 yo M with a PMHx of HTN, HLD, MI, CAD, A-fib, COPD, CHF, carotid artery stenosis, PVD, B12 deficiency, GERD, nephrolithiasis, osteoporosis brought into [facility] by EMS after patient experienced nausea and sudden onset CP. Pt also dx with PNA   Clinical Impression   PT admitted with PNA, nausea and CP. Pt currently with functional limitiations due to the deficits listed below (see OT problem list). PTA living at home with oxygen dependence at MOD I level after SNF rehab stay. Pt educated that prior to d/c home communication with Brother in [place] "[name]" must be clearly defined that he will be present for several days 24/7 to (A) patient. Pt expressed understanding. Pt will benefit from skilled OT to increase their independence and safety with adls and balance to allow discharge [place]. Pt with oxgyen desaturation during session to 87% on 3L.     Follow Up Recommendations  Home health OT;Supervision/Assistance - 24 hour    Equipment Recommendations  None recommended by OT    Recommendations for Other Services       Precautions / Restrictions Precautions Precautions: Fall Precaution Comments: monitor O2. Has compression fracture of spine      Mobility Bed Mobility Overal bed mobility: Needs Assistance Bed Mobility: Supine to Sit     Supine to sit: Min guard;HOB elevated     General bed mobility comments: use of bed rails to complete task. pt with good sequence to exit keeping back alignment  Transfers Overall transfer level: Needs assistance Equipment used: Rolling walker (2 wheeled) Transfers: Sit to/from Stand Sit to Stand: Min guard         General transfer comment: pt with good hand placement and good position in RW    Balance                                            ADL Overall ADL's : Needs  assistance/impaired Eating/Feeding: Modified independent;Bed level   Grooming: Wash/dry hands;Wash/dry face;Modified independent                   Toilet Transfer: Min guard;RW             General ADL Comments: Pt with a rattle sound to breathing and reports feeling slightly SOB. pt with oxgyen saturations in bed at 93 % on 3 L.pt stand pivot to chair and once pulse ox read his oxgyen levels were 87% on 3L. pt with purse lip breathing and reports DOE. pt requesting to d/c home and feels he can manage at home. Pt provided EC handout and education. pt reports outpatient facility near his home. Pt educated that communication with family for definite plans have to be defined clearly before d/c can be finalized. Pt verbalized to therapist during session that he was primary driver in the house until this event. pt reports son can (A) but he he obligations to transport children to school and from school in addition to work. OT recommending home health to allow pt to d/c home and decr burden of transportation needs. Pt with previous stay at SNF and willing to return if required.      Vision Vision Assessment?: No apparent visual deficits   Perception     Praxis  Pertinent Vitals/Pain Pain Assessment: No/denies pain     Hand Dominance Right   Extremity/Trunk Assessment Upper Extremity Assessment Upper Extremity Assessment: Overall WFL for tasks assessed   Lower Extremity Assessment Lower Extremity Assessment: Defer to PT evaluation   Cervical / Trunk Assessment Cervical / Trunk Assessment: Normal   Communication Communication Communication: No difficulties   Cognition Arousal/Alertness: Awake/alert Behavior During Therapy: WFL for tasks assessed/performed Overall Cognitive Status: Within Functional Limits for tasks assessed                     General Comments       Exercises       Shoulder Instructions      Home Living Family/patient expects to be  discharged to:: Private residence Living Arrangements: Other relatives (brother in law- [name]) Available Help at Discharge: Family;Available PRN/intermittently Type of Home: House Home Access: Stairs to enter Entrance Stairs-Number of Steps: 4   Home Layout: One level     Bathroom Shower/Tub: Tub/shower unit (sponge bath (uses tub bench every 2-3 days))   Bathroom Toilet: Standard     Home Equipment: Environmental consultant - 2 wheels;Tub bench;Adaptive equipment Adaptive Equipment: Reacher;Sock aid;Long-handled shoe horn;Long-handled sponge Additional Comments: brother has been transportation due to patient and brother in law unable to drive      Prior Functioning/Environment Level of Independence: Needs assistance  Gait / Transfers Assistance Needed: Son reports pt sometimes needs assist to stand and has been using RW lately.     Comments: only walks 100' max in house, holds onto furniture and performs own ADLs, brother-in-law does the cooking    OT Diagnosis: Generalized weakness   OT Problem List: Decreased strength;Decreased activity tolerance;Impaired balance (sitting and/or standing)   OT Treatment/Interventions: Self-care/ADL training;Therapeutic exercise;Energy conservation;DME and/or AE instruction;Therapeutic activities;Patient/family education;Balance training    OT Goals(Current goals can be found in the care plan section) Acute Rehab OT Goals Patient Stated Goal: to go home OT Goal Formulation: With patient Time For Goal Achievement: 03/02/15 Potential to Achieve Goals: Good  OT Frequency: Min 2X/week   Barriers to D/C: Other (comment) (decr transportation)          Co-evaluation              End of Session Equipment Utilized During Treatment: Gait belt;Rolling walker Nurse Communication: Mobility status;Precautions  Activity Tolerance: Patient tolerated treatment well Patient left: in chair;with call bell/phone within reach;with chair alarm set   Time:  [phone] OT Time Calculation (min): 33 min Charges:  OT General Charges $OT Visit: 1 Procedure OT Evaluation $Initial OT Evaluation Tier I: 1 Procedure OT Treatments $Self Care/Home Management : 8-22 mins G-Codes:    Peri Maris 03-14-2015, 9:20 AM  Jeri Modena   OTR/L Pager: [phone] Office: [phone] .

## 2015-02-16 NOTE — Progress Notes (Signed)
Patient ID: [name], male   DOB: May 22, 1940, 74 y.o.   MRN: [id number] Medicine attending: I examined this patient this morning together with resident physician Dr. Shela Leff and we discussed a management plan. 74 year old man with oxygen-dependent obstructive airway disease admitted on November 19 with a one-week history of worsening dyspnea, productive cough, wheezing, subjective chills, nausea, and left-sided back and shoulder pain. Chest x-ray showed changes from previous coronary bypass procedure/median sternotomy. Bilateral pleural effusions right greater than left. Interstitial edema. Bilateral patchy airspace opacities. CT scan done for further characterization confirmed multifocal bilateral pulmonary infiltrates and effusions. He has severe, bullous emphysema. EKG shows atrial flutter with a 4-1 block. Prolonged QT interval. No acute ischemic changes. BNP 403 compare with 309 in February. Treatment initiated for a community acquired pneumonia. We will add a short course of steroids in addition to his bronchodilators. Outpatient antiarrhythmics with Cardizem and amiodarone will be continued to control his chronic A. fib/flutter.

## 2015-02-16 NOTE — Progress Notes (Signed)
Pulse oximetry readings were predominantly above > 92%. There was no evidence of transient nocturnal desaturations to < 88 %. Patient continued to exhibit exertional dyspnea with minimal activity, relieved primarily by rest. Patient also continued to report episodic, nonproductive cough. Vital signs remained stable overnight.

## 2015-02-16 NOTE — Clinical Social Work Placement (Signed)
   CLINICAL SOCIAL WORK PLACEMENT  NOTE  Date:  02/16/2015  Patient Details  Name: [name] MRN: [id number] Date of Birth: [birth year]  Clinical Social Work is seeking post-discharge placement for this patient at the [place] level of care (*CSW will initial, date and re-position this form in  chart as items are completed):  No   Patient/family provided with [place] Work Department's list of facilities offering this level of care within the geographic area requested by the patient (or if unable, by the patient's family).  Yes   Patient/family informed of their freedom to choose among providers that offer the needed level of care, that participate in Medicare, Medicaid or managed care program needed by the patient, have an available bed and are willing to accept the patient.  No   Patient/family informed of [facility]'s ownership interest in [facility] and [facility], as well as of the fact that they are under no obligation to receive care at these facilities.  PASRR submitted to EDS on       PASRR number received on       Existing PASRR number confirmed on       FL2 transmitted to all facilities in geographic area requested by pt/family on 02/16/15     FL2 transmitted to all facilities within larger geographic area on 02/16/15     Patient informed that his/her managed care company has contracts with or will negotiate with certain facilities, including the following:            Patient/family informed of bed offers received.  Patient chooses bed at       Physician recommends and patient chooses bed at      Patient to be transferred to   on  .  Patient to be transferred to facility by       Patient family notified on   of transfer.  Name of family member notified:        PHYSICIAN       Additional Comment:    _______________________________________________ Sable Feil, LCSW 02/16/2015, 3:55 PM

## 2015-02-16 NOTE — NC FL2 (Signed)
[place] LEVEL OF CARE SCREENING TOOL     IDENTIFICATION  Patient Name: [name] Birthdate: 25-Apr-1940 Sex: male Admission Date (Current Location): 02/14/2015  [facility] and [place] Number: Herbalist and Address:  [facility] [place], [place] [address] [place], [place] [address]      Provider Number:    Attending Physician Name and Address:  Annia Belt, MD  Relative Name and Phone Number:  Jak, [place]  [phone]    Current Level of Care: Hospital Recommended Level of Care: [place] Prior Approval Number:    Date Approved/Denied:   PASRR Number: [id number]  Discharge Plan: SNF    Current Diagnoses: Patient Active Problem List   Diagnosis Date Noted  . Iron deficiency anemia 02/15/2015  . CAD (coronary artery disease) 09/18/2014  . Compression fracture of L1 lumbar vertebra (HCC)   . Syncope 08/23/2014  . Low back pain 08/23/2014  . Hypertension 08/23/2014  . COPD (chronic obstructive pulmonary disease) ([place]) 08/23/2014  . Chronic atrial fibrillation ([place]) 08/23/2014  . Syncope and collapse   . Olecranon bursitis 07/09/2014  . Diabetes mellitus type II, controlled ([place]) 07/01/2014  . Chronic anticoagulation 05/29/2014  . Diastolic CHF ([place]) 99991111  . CAP (community acquired pneumonia) 04/03/2014  . Allergic rhinitis 02/06/2014  . GERD (gastroesophageal reflux disease) 02/06/2014  . Do not resuscitate 02/06/2014  . Erectile dysfunction 06/18/2010  . Atrial fibrillation ([place]) 11/06/2009  . Peripheral vascular disease ([place]) 09/02/2009  . Carotid artery stenosis 04/16/2009  . ANEMIA, B12 DEFICIENCY 12/02/2008  . COPD GOLD III 05/21/2007  . TOBACCO USE 01/19/2007  . Hyperlipidemia 11/21/2006  . Essential hypertension 11/21/2006  . Hx of CABG 11/21/2006  . Osteoporosis 11/21/2006    Orientation ACTIVITIES/SOCIAL BLADDER RESPIRATION    Self, Place, Time  Passive, Family supportive  Continent Normal  BEHAVIORAL SYMPTOMS/MOOD NEUROLOGICAL BOWEL NUTRITION STATUS      Continent Diet (Heart healthy-Carb modified)  PHYSICIAN VISITS COMMUNICATION OF NEEDS Height & Weight Skin    Verbally   142 lbs. Normal          AMBULATORY STATUS RESPIRATION    Supervision limited (Patient is minimum assist with ambulation) Normal      Personal Care Assistance Level of Assistance  Bathing, Feeding, Dressing Bathing Assistance: Limited assistance Feeding assistance: Independent Dressing Assistance: Limited assistance      Functional Limitations Info  Sight Sight Info: Impaired (Patient wears glasses)           SPECIAL CARE FACTORS FREQUENCY  PT (By licensed PT), OT (By licensed OT)     PT Frequency: Evaluated 11/19. Minimum of 3x per week recommended. OT Frequency: Evaluated 11/21. Minimum of 2x per week recommended.           Additional Factors Info  Code Status, Allergies Code Status Info: DNR Allergies Info: Xarelto rivaroxaban, Ticlopidine hcl, Lorazepam           Current Medications (02/16/2015): Current Facility-Administered Medications  Medication Dose Route Frequency Provider Last Rate Last Dose  . acetaminophen (TYLENOL) tablet 500 mg  500 mg Oral Q6H PRN Dellia Nims, MD   500 mg at 02/15/15 2237  . albuterol (PROVENTIL) (2.5 MG/3ML) 0.083% nebulizer solution 2.5 mg  2.5 mg Nebulization Q4H PRN Tasrif Ahmed, MD      . amiodarone (PACERONE) tablet 200 mg  200 mg Oral Daily Tasrif Ahmed, MD   200 mg at 02/16/15 1055  . antiseptic oral rinse (CPC / CETYLPYRIDINIUM  CHLORIDE 0.05%) solution 7 mL  7 mL Mouth Rinse q12n4p Axel Filler, MD   7 mL at 02/16/15 1305  . atorvastatin (LIPITOR) tablet 40 mg  40 mg Oral Daily Tasrif Ahmed, MD   40 mg at 02/16/15 1056  . azithromycin (ZITHROMAX) tablet 250 mg  250 mg Oral Daily Tasrif Ahmed, MD   250 mg at 02/16/15 1056  . budesonide-formoterol (SYMBICORT) 160-4.5 MCG/ACT inhaler 2 puff  2 puff Inhalation  BID Dellia Nims, MD   2 puff at 02/16/15 0733  . busPIRone (BUSPAR) tablet 7.5 mg  7.5 mg Oral BID Tasrif Ahmed, MD   7.5 mg at 02/16/15 1055  . calcium-vitamin D (OSCAL WITH D) 500-200 MG-UNIT per tablet 1 tablet  1 tablet Oral Q breakfast Axel Filler, MD   1 tablet at 02/16/15 [id number]  . cefTRIAXone (ROCEPHIN) 1 g in dextrose 5 % 50 mL IVPB  1 g Intravenous Q24H Tasrif Ahmed, MD   1 g at 02/15/15 2237  . chlorhexidine (PERIDEX) 0.12 % solution 15 mL  15 mL Mouth Rinse BID Axel Filler, MD   15 mL at 02/16/15 1056  . dextromethorphan-guaiFENesin (MUCINEX DM) 30-600 MG per 12 hr tablet 1 tablet  1 tablet Oral BID Dellia Nims, MD   1 tablet at 02/16/15 1055  . diltiazem (CARDIZEM CD) 24 hr capsule 180 mg  180 mg Oral Daily Tasrif Ahmed, MD   180 mg at 02/16/15 1056  . enoxaparin (LOVENOX) injection 40 mg  40 mg Subcutaneous Q24H Tasrif Ahmed, MD   40 mg at 02/15/15 1934  . [START ON 02/17/2015] feeding supplement (ENSURE ENLIVE) (ENSURE ENLIVE) liquid 237 mL  237 mL Oral Q1500 Dale Raritan, RD      . ferrous sulfate tablet 325 mg  325 mg Oral QHS Tasrif Ahmed, MD   325 mg at 02/15/15 2109  . furosemide (LASIX) tablet 40 mg  40 mg Oral Daily Tasrif Ahmed, MD   40 mg at 02/16/15 1056  . Influenza vac split quadrivalent PF (FLUARIX) injection 0.5 mL  0.5 mL Intramuscular Tomorrow-1000 Axel Filler, MD   0.5 mL at 02/15/15 0925  . insulin aspart (novoLOG) injection 0-9 Units  0-9 Units Subcutaneous TID WC Tasrif Ahmed, MD   2 Units at 02/16/15 1304  . insulin detemir (LEVEMIR) injection 5 Units  5 Units Subcutaneous Daily Tasrif Ahmed, MD   5 Units at 02/15/15 0931  . ipratropium-albuterol (DUONEB) 0.5-2.5 (3) MG/3ML nebulizer solution 3 mL  3 mL Nebulization Q6H Tasrif Ahmed, MD   3 mL at 02/16/15 1326  . loratadine (CLARITIN) tablet 10 mg  10 mg Oral Daily Tasrif Ahmed, MD   10 mg at 02/16/15 1056  . pantoprazole (PROTONIX) EC tablet 40 mg  40 mg Oral BID Tasrif Ahmed, MD    40 mg at 02/16/15 1056  . predniSONE (DELTASONE) tablet 40 mg  40 mg Oral Q breakfast Tasrif Ahmed, MD      . tiotropium (SPIRIVA) inhalation capsule 18 mcg  1 capsule Inhalation Daily Tasrif Ahmed, MD   18 mcg at 02/16/15 0801  . vitamin B-12 (CYANOCOBALAMIN) tablet 2,000 mcg  2,000 mcg Oral Daily Tasrif Ahmed, MD   2,000 mcg at 02/16/15 1056   Do not use this list as official medication orders. Please verify with discharge summary.  Discharge Medications:   Medication List    ASK your doctor about these medications        acetaminophen 500 MG tablet  Commonly  known as:  TYLENOL  Take 500 mg by mouth every 6 (six) hours as needed for mild pain or moderate pain.     albuterol (2.5 MG/3ML) 0.083% nebulizer solution  Commonly known as:  PROVENTIL  Take 2.5 mg by nebulization every 6 (six) hours as needed for wheezing or shortness of breath.     alendronate 70 MG tablet  Commonly known as:  FOSAMAX  Take 70 mg by mouth once a week. Take with a full glass of water on an empty stomach.     amiodarone 200 MG tablet  Commonly known as:  PACERONE  TAKE 1 TABLET (200 MG TOTAL) BY MOUTH DAILY.     atorvastatin 40 MG tablet  Commonly known as:  LIPITOR  Take 40 mg by mouth daily.     budesonide-formoterol 160-4.5 MCG/ACT inhaler  Commonly known as:  SYMBICORT  Inhale 2 puffs into the lungs 2 (two) times daily.     busPIRone 7.5 MG tablet  Commonly known as:  BUSPAR  Take 7.5 mg by mouth 2 (two) times daily.     CALTRATE 600+D PLUS 600-400 MG-UNIT per tablet  Chew 1 tablet by mouth daily.     cyanocobalamin 2000 MCG tablet  Commonly known as:  [employer] VITAMIN B12  Take 1 tablet (2,000 mcg total) by mouth daily.     diltiazem 180 MG 24 hr capsule  Commonly known as:  CARDIZEM CD  Take 1 capsule (180 mg total) by mouth daily.     furosemide 40 MG tablet  Commonly known as:  LASIX  TAKE 1 TABLET BY MOUTH EVERY DAY     insulin detemir 100 UNIT/ML injection  Commonly known as:   LEVEMIR  Inject 0.05 mLs (5 Units total) into the skin daily.     insulin lispro 100 UNIT/ML injection  Commonly known as:  HUMALOG  Inject 3 Units into the skin 3 (three) times daily before meals.     OXYGEN  Inhale 2 L into the lungs continuous.     pantoprazole 40 MG tablet  Commonly known as:  PROTONIX  TAKE 1 TABLET BY MOUTH TWICE A DAY     potassium chloride SA 20 MEQ tablet  Commonly known as:  K-DUR,KLOR-CON  Take 1 tablet (20 mEq total) by mouth daily.     SPIRIVA HANDIHALER 18 MCG inhalation capsule  Generic drug:  tiotropium  INHALE CONTENTS OF 1 CAPSULE DAILY     ZYRTEC ALLERGY 10 MG tablet  Generic drug:  cetirizine  Take 1 tablet (10 mg total) by mouth every morning.        Relevant Imaging Results:  Relevant Lab Results:  Recent Labs    Additional Information    Sable Feil, LCSW

## 2015-02-16 NOTE — Progress Notes (Signed)
Subjective: Patient was seen and examined at bedside today. Denies having any cough, SOB, or chest pain. He is tolerating PO intake well. No other complaints.   Objective: Vital signs in last 24 hours: Filed Vitals:   02/14/15 1948 02/14/15 2044 02/15/15 0455 02/15/15 0841  BP:  134/72 113/69 111/60  Pulse:  86 69 71  Temp:  98 F (36.7 C) 97.7 F (36.5 C) 98.7 F (37.1 C)  TempSrc:  Oral Oral Oral  Resp:  18 16 16   Weight:  142 lb (64.411 kg)    SpO2: 94% 92% 98% 96%   Weight change:   Intake/Output Summary (Last 24 hours) at 02/15/15 0957 Last data filed at 02/15/15 0841  Gross per 24 hour  Intake    580 ml  Output    701 ml  Net   -121 ml   Physical Exam: Constitutional: He is oriented to person, place, and time. No distress.   Cardiovascular: Normal rate and intact distal pulses.  Irregular rhythm  Pulmonary/Chest: Effort normal.  On 2L O2 via [place] Minimal diffuse wheezing.  Abdominal: Soft. Bowel sounds are normal. He exhibits no distension. There is no tenderness.  Musculoskeletal: He exhibits no edema.  Skin: Skin is warm and dry.   Lab Results: Basic Metabolic Panel:  Recent Labs Lab 02/14/15 0957 02/14/15 1533 02/15/15 0344  NA 133*  --  136  K 4.0  --  3.4*  CL 96*  --  96*  CO2 28  --  33*  GLUCOSE 160*  --  184*  BUN 18  --  22*  CREATININE 1.02 1.15 1.07  CALCIUM 8.1*  --  8.0*   Liver Function Tests:  Recent Labs Lab 02/14/15 0957 02/15/15 0344  AST 24 19  ALT 29 27  ALKPHOS 47 48  BILITOT 1.3* 0.6  PROT 6.1* 5.8*  ALBUMIN 2.8* 2.5*   CBC:  Recent Labs Lab 02/14/15 0957 02/14/15 1533 02/15/15 0344  WBC 12.5* 9.9 10.2  NEUTROABS 10.6*  --  8.1*  HGB 10.1* 9.8* 9.7*  HCT 33.1* 32.2* 32.3*  MCV 86.0 86.3 86.1  PLT 164 166 189   Cardiac Enzymes:  Recent Labs Lab 02/14/15 1533 02/14/15 2148  TROPONINI 0.03 0.03   CBG:  Recent Labs Lab 02/14/15 1637 02/14/15 2106 02/15/15 0735  GLUCAP 150* 142* 111*    Anemia Panel:  Recent Labs Lab 02/15/15 0344  VITAMINB12 1133*  FOLATE 16.1  FERRITIN 67  TIBC 329  IRON 14*  RETICCTPCT 1.2   Urinalysis:  Recent Labs Lab 02/14/15 1133  COLORURINE YELLOW  LABSPEC 1.015  PHURINE 6.0  GLUCOSEU NEGATIVE  HGBUR NEGATIVE  BILIRUBINUR NEGATIVE  KETONESUR NEGATIVE  PROTEINUR NEGATIVE  NITRITE NEGATIVE  LEUKOCYTESUR NEGATIVE   Micro Results: No results found for this or any previous visit (from the past 240 hour(s)). Studies/Results: Dg Chest 2 View  02/14/2015  CLINICAL DATA:  Chest pain and shortness of breath. Wheezing and congestion EXAM: CHEST  2 VIEW COMPARISON:  08/28/2014 FINDINGS: Heart size is normal. Previous median sternotomy and CABG procedure. There are bilateral pleural effusions and interstitial edema. There are also patchy airspace opacities identified in both lungs. IMPRESSION: 1. CHF. 2. Bilateral airspace opacities which may represent areas of alveolar edema or superimposed infection. Electronically Signed   By: Kerby Moors M.D.   On: 02/14/2015 10:32   Ct Chest Wo Contrast  02/14/2015  CLINICAL DATA:  CHF versus pneumonia. Patient with hemoptysis. History of COPD. EXAM: CT CHEST  WITHOUT CONTRAST TECHNIQUE: Multidetector CT imaging of the chest was performed following the standard protocol without IV contrast. COMPARISON:  Chest radiograph, 02/14/2015.  Chest CT, 08/25/2014. FINDINGS: Thoracic inlet: No mass or adenopathy. Status post left thyroidectomy. Right thyroid lobe is unremarkable. Mediastinum and hila: Heart is borderline enlarged. There are dense coronary artery calcifications. Great vessels are normal caliber. Atherosclerotic calcifications are noted along the thoracic aorta and branch vessels. No mediastinal or hilar masses or pathologically enlarged lymph nodes. Small to moderate size hiatal hernia. Lungs and pleura: Minimal, right greater than left, pleural effusions. Patchy airspace consolidation is noted in  the right lower lobe and posterior aspect of the right upper lobe, and to a lesser degree, right middle lobe. Dependent opacity in the left lower lobe is also noted that is likely due to atelectasis. There is hazy opacity in the left lower lobe superior segment similar to that seen in the right lower lobe. There is an area of focal opacity in the left upper lobe lingula consistent with chronic scarring. Small spiculated nodule in the left upper lobe, image 26, series 205, measuring 8 mm, new from the prior CT. No other lung nodules. There is moderate to advanced emphysema similar to the prior study. No pneumothorax. Limited upper abdomen: Low-density renal masses consistent with cysts. No liver lesion. No adrenal masses. Status postcholecystectomy. Musculoskeletal: Burst fracture of L1, severe, increased in severity since the prior CT. Moderate to severe compression fracture of T7, stable. Slight depressions of the upper endplates of T4 through T6, stable. Bones are diffusely demineralized. No osteoblastic or osteolytic lesions. Stable changes from previous CABG surgery. IMPRESSION: 1. Multifocal pneumonia reflected by airspace opacity most evident in the posterior right upper lobe and right lower lobe, but also seen in the superior segment the left lower lobe and mildly in the right middle lobe. Associated minimal pleural effusions. 2. 8 mm nodule in the left upper lobe, new from the prior study, concerning for malignancy. Recommend consultation for possible biopsy. 3. There are areas of chronic lung scarring as well as moderate to advanced emphysema. 4. No mediastinal or hilar masses or pathologically enlarged lymph nodes. 5. Thoracic and upper lumbar spine fractures as described. Electronically Signed   By: Lajean Manes M.D.   On: 02/14/2015 12:23   Medications: I have reviewed the patient's current medications. Scheduled Meds: . albuterol  2.5 mg Nebulization Q4H  . amiodarone  200 mg Oral Daily  .  antiseptic oral rinse  7 mL Mouth Rinse q12n4p  . atorvastatin  40 mg Oral Daily  . azithromycin  250 mg Oral Daily  . budesonide-formoterol  2 puff Inhalation BID  . busPIRone  7.5 mg Oral BID  . calcium-vitamin D  1 tablet Oral Q breakfast  . cefTRIAXone (ROCEPHIN)  IV  1 g Intravenous Q24H  . chlorhexidine  15 mL Mouth Rinse BID  . diltiazem  180 mg Oral Daily  . enoxaparin (LOVENOX) injection  40 mg Subcutaneous Q24H  . feeding supplement (ENSURE ENLIVE)  237 mL Oral BID BM  . ferrous sulfate  325 mg Oral QHS  . furosemide  40 mg Oral Daily  . Influenza vac split quadrivalent PF  0.5 mL Intramuscular Tomorrow-1000  . insulin aspart  0-9 Units Subcutaneous TID WC  . insulin detemir  5 Units Subcutaneous Daily  . loratadine  10 mg Oral Daily  . pantoprazole  40 mg Oral BID  . potassium chloride  40 mEq Oral Once  . tiotropium  1 capsule Inhalation Daily  . cyanocobalamin  2,000 mcg Oral Daily   Continuous Infusions:  PRN Meds:.acetaminophen, albuterol Assessment/Plan: Principal Problem:   CAP (community acquired pneumonia) Active Problems:   Hyperlipidemia   ANEMIA, B12 DEFICIENCY   Essential hypertension   Hx of CABG   Osteoporosis   Diastolic CHF ([place])   Diabetes mellitus type II, controlled ([place])   COPD (chronic obstructive pulmonary disease) (H[facility])   Chronic atrial fibrillation ([facility])   Compression fracture of L1 lumbar vertebra ([facility])   CAD (coronary artery disease)   Iron deficiency anemia  CAP Patient presenting with a 1 week history of worsening SOB, cough productive of grey sputum, wheezing, chills, and malaise. CXR showing areas of alveolar edema/ superimposed infection. CT of chest showing multifocal pneumonia and minimal pleural effusions. Also areas of chronic lung scarring and moderate-advanced emphysema. He had mild leukocytosis (WBC 12.5) on admission. Symptoms not likely 2/2 ACS because patient denies having any chest pain. EKG at the time of admission  showing atrial flutter and prolonged QT interval but no acute ST/ T wave changes.Troponin x 3 (0.03). Exam today remarkable for minimal wheezing (improved from yesterday). Satting 89-97% on 2L O2 via [place]. He is on 2L O2 at home. Bicarb 34 likely due to repiratory acidosis with metabolic compensation in the setting of CAP and hx of COPD.  -Ceftriaxone 1 g IV every 24 hours -Azithromycin 250 mg daily for 4 days -Prednisone 40 mg x 5 days -Mucinex DM  -Albuterol nebulizer -Budesonide-formoterol inhaler -Spiriva  -CBC in am  COPD Patient has a 50 pack year smoking history, quit 3-4 years ago. CT of lungs showing chronic lung scarring and moderate-advanced emphysema.  -Albuterol nebulizer -Budesonide-formoterol inhaler -Spiriva   Pulmonary nodule  CT of chest showing a 8 mm nodule in the L upper lobe (new from prior study). Nodule suspicious for possible malignancy because patient has a 50 pack year smoking history. Discussed CT findings with the patient today and he expressed his understanding.  -Will suggest PCP to pursue further workup such as PET scan and biopsy of the nodule as outpatient.  History of falls and fractures Burst fracture of T1 and compression fracture of T7 seen on imaging. However, no pain on palpation and normal ROM on physical exam. Patient did not complain of any back or shoulder pain today.   Afib EKG showing atrial flutter and QT prolongation (QTc 471).  -Amiodarone 200 mg daily -Cardizem 180 mg daily   CAD/ CHF -cardizem 180 mg daily  -Hold Lasix 40 mg daily.  Hypokalemia - K 3.5 today. Replete as needed.   Normocytic anemia Patient's Hgb in the 13s (up until 07/2014). Hgb 10.1 on admission. He denies having any unintentional weight loss, hematemesis, hematochezia, or melena. Anemia panel showing low iron, low saturation, ferritin on the lower limit of normal. TIBC normal. Likely due to anemia of acute illness.  -Pending FOBT  HLD -Lipitor 40 mg  daily  DM -Levemir 5 u daily  -SSI -CBG monitoring   GERD -Protonix 40 mg BID  Osteoporosis  -Fosamax 70 mg qweekly -Caltrate 600 +D plus  Anxiety -Buspar 7.5 mg BID  Allergies -Claritin 10 mg daily  B12 deficiency -B12 2000 mcg daily  DVT ppx -Lovenox   Diet -cardiac -ensure   Code: DNR   Dispo: Disposition is deferred at this time, awaiting improvement of current medical problems.  Anticipated discharge in approximately 1-2 day(s).   The patient does have a current PCP Marin Olp,  MD) and does need an [facility] hospital follow-up appointment after discharge.  The patient does not have transportation limitations that hinder transportation to clinic appointments.  .Services Needed at time of discharge: Y = Yes, Blank = No PT:   OT:   RN:   Equipment:   Other:     LOS: 1 day   Shela Leff, MD 02/15/2015, 9:57 AM

## 2015-02-16 NOTE — Progress Notes (Signed)
Nutrition Brief Note  Patient identified on the Malnutrition Screening Tool (MST) Report  Wt Readings from Last 15 Encounters:  02/15/15 142 lb 4.8 oz (64.547 kg)  09/18/14 132 lb (59.875 kg)  08/29/14 164 lb 0.4 oz (74.4 kg)  08/19/14 151 lb 1.9 oz (68.548 kg)  08/07/14 154 lb (69.854 kg)  07/09/14 151 lb (68.493 kg)  06/30/14 151 lb (68.493 kg)  06/27/14 151 lb (68.493 kg)  06/24/14 153 lb (69.4 kg)  05/29/14 147 lb 6.4 oz (66.86 kg)  05/16/14 147 lb 1.6 oz (66.724 kg)  04/03/14 149 lb (67.586 kg)  03/19/14 152 lb (68.947 kg)  03/17/14 157 lb 3.2 oz (71.305 kg)  03/10/14 147 lb (66.679 kg)    Body mass index is 25.21 kg/(m^2). Patient meets criteria for overweight based on current BMI. Usual body weight reported to be ~143 lbs.   Current diet order is heart healthy/carbohydrate modifed, patient is consuming approximately 75-100% of meals at this time. Pt reports having a good appetite currently and PTA with no other difficulties. Pt has Ensure ordered and reports he would like to continue with them. RD to modify order to provide once daily as intake has been adequate. Labs and medications reviewed.   No further nutrition interventions warranted at this time. If nutrition issues arise, please consult RD.   Corrin Parker, MS, RD, LDN Pager # [phone] After hours/ weekend pager # [phone]

## 2015-02-17 LAB — BASIC METABOLIC PANEL
ANION GAP: 10 (ref 5–15)
BUN: 17 mg/dL (ref 6–20)
CALCIUM: 8.9 mg/dL (ref 8.9–10.3)
CO2: 31 mmol/L (ref 22–32)
Chloride: 96 mmol/L — ABNORMAL LOW (ref 101–111)
Creatinine, Ser: 1 mg/dL (ref 0.61–1.24)
GFR calc Af Amer: >60 mL/min (ref >60)
GFR calc non Af Amer: >60 mL/min (ref >60)
GLUCOSE: 185 mg/dL — AB (ref 65–99)
Potassium: 4.4 mmol/L (ref 3.5–5.1)
Sodium: 137 mmol/L (ref 135–145)

## 2015-02-17 LAB — CBC
HEMATOCRIT: 34.2 % — AB (ref 39.0–52.0)
Hemoglobin: 10.3 g/dL — ABNORMAL LOW (ref 13.0–17.0)
MCH: 25.9 pg — AB (ref 26.0–34.0)
MCHC: 30.1 g/dL (ref 30.0–36.0)
MCV: 86.1 fL (ref 78.0–100.0)
Platelets: 249 10*3/uL (ref 150–400)
RBC: 3.97 MIL/uL — ABNORMAL LOW (ref 4.22–5.81)
RDW: 17.9 % — AB (ref 11.5–15.5)
WBC: 9.2 10*3/uL (ref 4.0–10.5)

## 2015-02-17 LAB — GLUCOSE, CAPILLARY
GLUCOSE-CAPILLARY: 168 mg/dL — AB (ref 65–99)
GLUCOSE-CAPILLARY: 273 mg/dL — AB (ref 65–99)
Glucose-Capillary: 157 mg/dL — ABNORMAL HIGH (ref 65–99)
Glucose-Capillary: 174 mg/dL — ABNORMAL HIGH (ref 65–99)

## 2015-02-17 NOTE — Progress Notes (Signed)
Patient ID: [name], male   DOB: 04-Jan-1941, 74 y.o.   MRN: [id number] Medicine attending: I examined this patient this morning together with resident physician Dr. Shela Leff. He has had some intermittent confusion over the last 24 hours. Not clear if this is related to steroids. Since the steroids are not a critical part of his medications we will discontinue them. His son is here. He states that his dad has been intermittently confused over the last year. At present, he is lucid, he is oriented to person, place, he knew that the current president is Obama and that Trump will be the new president.  He is still mildly dyspneic at rest with oxygen on but oxygen saturations on 3 L nasal cannula have been consistently in the 90s. Chest is tighter today with bilateral expiratory wheezes. Impression: Multifocal bilateral pneumonia and then oxygen dependent man with severe bullous emphysema. We will continue parenteral antibiotics at this time.

## 2015-02-17 NOTE — Progress Notes (Signed)
Occupational Therapy Treatment Patient Details Name: LYN WITHEM MRN: [id number] DOB: [birth year] Today's Date: 02/17/2015    History of present illness 74 yo M with a PMHx of HTN, HLD, MI, CAD, A-fib, COPD, CHF, carotid artery stenosis, PVD, B12 deficiency, GERD, nephrolithiasis, osteoporosis brought into [facility] by EMS after patient experienced nausea and sudden onset CP. Pt also dx with PNA   OT comments  Pt demonstrates decr balance , DOE and visual hallucinations this session. Recommend changed due to question ability to d/c home in current state. If patient resolves from cognitive changes then possible d/c home. Concerned with falls and family assistance. C   Follow Up Recommendations  SNF (QUESTION with cognitive changes)    Equipment Recommendations  None recommended by OT    Recommendations for Other Services      Precautions / Restrictions Precautions Precautions: Fall Precaution Comments: monitor O2       Mobility Bed Mobility Overal bed mobility: Modified Independent             General bed mobility comments: incr time and hOb elevated  Transfers Overall transfer level: Needs assistance Equipment used: Rolling walker (2 wheeled) Transfers: Sit to/from Stand Sit to Stand: Min guard         General transfer comment: cues for hand placement    Balance Overall balance assessment: Needs assistance         Standing balance support: Single extremity supported;During functional activity Standing balance-Leahy Scale: Fair                     ADL Overall ADL's : Needs assistance/impaired     Grooming: Wash/dry hands;Minimal assistance Grooming Details (indicate cue type and reason): posterior lean and DOE.              Lower Body Dressing: Supervision/safety;Bed level Lower Body Dressing Details (indicate cue type and reason): able to don socks                General ADL Comments: pt with DOE with mobility and needs cues for pursed  lip breathing. pt currenlty with visual hallunications affecting all adls and mobility. Question patients ability to d/c home in current state.       Vision                     Perception     Praxis      Cognition   Behavior During Therapy: Anxious Overall Cognitive Status: Impaired/Different from baseline                  General Comments: Pt having a reaction to medication and reports hallunications in room. pt states "no there is stuff coming out of the walls i know that isnt really there. Are my hands blue to you?" Pt anxious because of the abnormal visual hallunications.    Extremity/Trunk Assessment               Exercises     Shoulder Instructions       General Comments      Pertinent Vitals/ Pain       Pain Assessment: No/denies pain  Home Living                                          Prior Functioning/Environment  Frequency Min 2X/week     Progress Toward Goals  OT Goals(current goals can now be found in the care plan section)  Progress towards OT goals: Progressing toward goals  Acute Rehab OT Goals Patient Stated Goal: go home by thanksgiving OT Goal Formulation: With patient Time For Goal Achievement: 03/02/15 Potential to Achieve Goals: Good ADL Goals Pt Will Perform Grooming: with supervision;sitting Pt Will Perform Upper Body Bathing: with supervision;sitting Pt Will Perform Lower Body Bathing: with supervision;sit to/from stand Pt Will Transfer to Toilet: with supervision;ambulating;bedside commode Additional ADL Goal #1: Pt will verbalize 2 EC that patient will use during ADLS to [place] Discharge plan needs to be updated    Co-evaluation                 End of Session Equipment Utilized During Treatment: Gait belt;Rolling walker   Activity Tolerance Patient tolerated treatment well   Patient Left in bed;with call bell/phone within reach;with bed alarm set;Other  (comment) (BED RAIL RAISED)   Nurse Communication Mobility status;Precautions        Time: [id number] OT Time Calculation (min): 24 min  Charges: OT General Charges $OT Visit: 1 Procedure OT Treatments $Self Care/Home Management : 23-37 mins  Peri Maris 02/17/2015, 1:42 PM   Jeri Modena   OTR/L Pager: [phone] Office: [phone] .

## 2015-02-17 NOTE — Progress Notes (Signed)
Physical Therapy Treatment Patient Details Name: [name] MRN: [id number] DOB: [birth year] Today's Date: 02/17/2015    History of Present Illness 74 yo M with a PMHx of HTN, HLD, MI, CAD, A-fib, COPD, CHF, carotid artery stenosis, PVD, B12 deficiency, GERD, nephrolithiasis, osteoporosis brought into [facility] by EMS after patient experienced nausea and sudden onset CP. Pt also dx with PNA    PT Comments    Pt presenting with confusion this date and hallucinations. Pt remains to have SOB and decreased activity tolerance despite 3LO2 via [place]. Due to patient confusion recommending SNF due to inability to care for self safely. PT at increased falls risk as well.   Follow Up Recommendations  SNF;Supervision/Assistance - 24 hour     Equipment Recommendations  Rolling walker with 5" wheels    Recommendations for Other Services       Precautions / Restrictions Precautions Precautions: Fall Precaution Comments: monitor O2 Restrictions Weight Bearing Restrictions: No    Mobility  Bed Mobility Overal bed mobility: Needs Assistance Bed Mobility: Supine to Sit     Supine to sit: Supervision     General bed mobility comments: incr time and hOb elevated  Transfers Overall transfer level: Needs assistance Equipment used: Rolling walker (2 wheeled) Transfers: Sit to/from Stand Sit to Stand: Min guard         General transfer comment: cues for hand placement  Ambulation/Gait Ambulation/Gait assistance: Min guard Ambulation Distance (Feet): 100 Feet Assistive device: Rolling walker (2 wheeled) Gait Pattern/deviations: Step-through pattern;Decreased stride length;Trunk flexed Gait velocity: decreased Gait velocity interpretation: Below normal speed for age/gender General Gait Details: pt with +SOB but didn't require rest break this date   Stairs            Wheelchair Mobility    Modified Rankin (Stroke Patients Only)       Balance Overall balance assessment: Needs  assistance         Standing balance support: Bilateral upper extremity supported Standing balance-Leahy Scale: Fair                      Cognition Arousal/Alertness: Awake/alert Behavior During Therapy: WFL for tasks assessed/performed Overall Cognitive Status: Impaired/Different from baseline                 General Comments: pt confused, reporting its august 22, [place] and who is going to take him back and forth    Exercises      General Comments        Pertinent Vitals/Pain Pain Assessment: No/denies pain    Home Living                      Prior Function            PT Goals (current goals can now be found in the care plan section) Progress towards PT goals: Progressing toward goals    Frequency  Min 3X/week    PT Plan Current plan remains appropriate    Co-evaluation             End of Session Equipment Utilized During Treatment: Gait belt;Oxygen Activity Tolerance: Patient limited by fatigue Patient left: in chair;with call bell/phone within reach;with family/visitor present     Time: [id number] PT Time Calculation (min) (ACUTE ONLY): 12 min  Charges:  $Gait Training: 8-22 mins  G CodesKingsley Callander 02/17/2015, 4:50 PM   Kittie Plater, PT, DPT Pager #: [phone] Office #: [phone]

## 2015-02-17 NOTE — Progress Notes (Signed)
Subjective: Patient was seen and examined at bedside today. Denies having any cough, SOB, or chest pain. He is tolerating PO intake well. As per son, patient experienced intermittent confusion after being started on Prednisone yesterday. However, does report the patient being intermittently confused over the past year. Patient was oriented to person, place, and knew who the president is.   Objective: Vital signs in last 24 hours: Filed Vitals:   02/16/15 2030 02/17/15 0048 02/17/15 0512 02/17/15 0941  BP: 117/75  117/63 130/75  Pulse: 70  67 67  Temp: 98.1 F (36.7 C)  98.7 F (37.1 C) 98.2 F (36.8 C)  TempSrc: Oral  Oral Oral  Resp: 17  18 18   Weight: 141 lb 8.6 oz (64.2 kg)     SpO2: 90% 94% 93% 96%   Weight change: -12.2 oz (-0.347 kg)  Intake/Output Summary (Last 24 hours) at 02/17/15 1702 Last data filed at 02/17/15 0900  Gross per 24 hour  Intake    480 ml  Output    400 ml  Net     80 ml   Physical Exam: Constitutional: He is oriented to person, place, and time. No distress.  Cardiovascular: Normal rate and intact distal pulses.  Irregular rhythm  Pulmonary/Chest: Effort normal.  On 3L O2 via [place] Bilateral expiratory wheezing.  Abdominal: Soft. Bowel sounds are normal. He exhibits no distension. There is no tenderness.  Musculoskeletal: He exhibits no edema.  Skin: Skin is warm and dry.   Lab Results: Basic Metabolic Panel:  Recent Labs Lab 02/16/15 0615 02/17/15 0352  NA 136 137  K 3.5 4.4  CL 96* 96*  CO2 34* 31  GLUCOSE 138* 185*  BUN 16 17  CREATININE 0.90 1.00  CALCIUM 8.2* 8.9   Liver Function Tests:  Recent Labs Lab 02/14/15 0957 02/15/15 0344  AST 24 19  ALT 29 27  ALKPHOS 47 48  BILITOT 1.3* 0.6  PROT 6.1* 5.8*  ALBUMIN 2.8* 2.5*   CBC:  Recent Labs Lab 02/14/15 0957  02/15/15 0344 02/16/15 0615 02/17/15 0352  WBC 12.5*  < > 10.2 9.6 9.2  NEUTROABS 10.6*  --  8.1*  --   --   HGB 10.1*  < > 9.7* 9.6* 10.3*  HCT  33.1*  < > 32.3* 31.9* 34.2*  MCV 86.0  < > 86.1 86.2 86.1  PLT 164  < > 189 225 249  < > = values in this interval not displayed. Cardiac Enzymes:  Recent Labs Lab 02/14/15 1533 02/14/15 2148  TROPONINI 0.03 0.03   CBG:  Recent Labs Lab 02/16/15 0716 02/16/15 1116 02/16/15 1616 02/16/15 2030 02/17/15 0742 02/17/15 1138  GLUCAP 118* 173* 122* 135* 157* 174*   Anemia Panel:  Recent Labs Lab 02/15/15 0344  VITAMINB12 1133*  FOLATE 16.1  FERRITIN 67  TIBC 329  IRON 14*  RETICCTPCT 1.2   Urinalysis:  Recent Labs Lab 02/14/15 1133  COLORURINE YELLOW  LABSPEC 1.015  PHURINE 6.0  GLUCOSEU NEGATIVE  HGBUR NEGATIVE  BILIRUBINUR NEGATIVE  KETONESUR NEGATIVE  PROTEINUR NEGATIVE  NITRITE NEGATIVE  LEUKOCYTESUR NEGATIVE   Micro Results: Recent Results (from the past 240 hour(s))  Culture, blood (routine x 2)     Status: None (Preliminary result)   Collection Time: 02/14/15  9:57 AM  Result Value Ref Range Status   Specimen Description BLOOD RIGHT ANTECUBITAL  Final   Special Requests BOTTLES DRAWN AEROBIC AND ANAEROBIC 5CC  Final   Culture NO GROWTH 3 DAYS  Final   Report Status PENDING  Incomplete  Culture, blood (routine x 2)     Status: None (Preliminary result)   Collection Time: 02/14/15 10:01 AM  Result Value Ref Range Status   Specimen Description BLOOD LEFT ANTECUBITAL  Final   Special Requests BOTTLES DRAWN AEROBIC AND ANAEROBIC 10CC  Final   Culture NO GROWTH 3 DAYS  Final   Report Status PENDING  Incomplete  Urine culture     Status: None   Collection Time: 02/14/15 11:33 AM  Result Value Ref Range Status   Specimen Description URINE, CLEAN CATCH  Final   Special Requests NONE  Final   Culture MULTIPLE SPECIES PRESENT, SUGGEST RECOLLECTION  Final   Report Status 02/15/2015 FINAL  Final   Studies/Results: No results found. Medications: I have reviewed the patient's current medications. Scheduled Meds: . amiodarone  200 mg Oral Daily  .  antiseptic oral rinse  7 mL Mouth Rinse q12n4p  . atorvastatin  40 mg Oral Daily  . azithromycin  250 mg Oral Daily  . budesonide-formoterol  2 puff Inhalation BID  . busPIRone  7.5 mg Oral BID  . calcium-vitamin D  1 tablet Oral Q breakfast  . cefTRIAXone (ROCEPHIN)  IV  1 g Intravenous Q24H  . chlorhexidine  15 mL Mouth Rinse BID  . dextromethorphan-guaiFENesin  1 tablet Oral BID  . diltiazem  180 mg Oral Daily  . enoxaparin (LOVENOX) injection  40 mg Subcutaneous Q24H  . feeding supplement (ENSURE ENLIVE)  237 mL Oral Q1500  . ferrous sulfate  325 mg Oral QHS  . furosemide  40 mg Oral Daily  . Influenza vac split quadrivalent PF  0.5 mL Intramuscular Tomorrow-1000  . insulin aspart  0-9 Units Subcutaneous TID WC  . insulin detemir  5 Units Subcutaneous Daily  . ipratropium-albuterol  3 mL Nebulization Q6H  . loratadine  10 mg Oral Daily  . pantoprazole  40 mg Oral BID  . tiotropium  1 capsule Inhalation Daily  . cyanocobalamin  2,000 mcg Oral Daily   Continuous Infusions:  PRN Meds:.acetaminophen, albuterol Assessment/Plan: Principal Problem:   CAP (community acquired pneumonia) Active Problems:   Hyperlipidemia   ANEMIA, B12 DEFICIENCY   Essential hypertension   Hx of CABG   Osteoporosis   Diastolic CHF ([place])   Diabetes mellitus type II, controlled (HCC)   COPD (chronic obstructive pulmonary disease) (HCC)   Chronic atrial fibrillation (HCC)   Compression fracture of L1 lumbar vertebra (HCC)   CAD (coronary artery disease)   Iron deficiency anemia  CAP Patient has a history of bullous emphysema and presented with multifocal bilateral pneumonia. He had mild leukocytosis (WBC 12.5) on admission. Leukocytosis has now resolved. Exam today remarkable for bilateral expiratory wheezing. Patient is satting 89-95% on 3L O2 via [place]. He is on 2L O2 at home.  -Ceftriaxone 1 g IV every 24 hours -Azithromycin 250 mg daily for 4 days total  -D/c Prednisone -Mucinex DM   -Albuterol nebulizer -Budesonide-formoterol inhaler -Spiriva  -CBC in am  COPD Patient has a 50 pack year smoking history, quit 3-4 years ago. CT of lungs showing chronic lung scarring and moderate-advanced emphysema.  -Albuterol nebulizer -Budesonide-formoterol inhaler -Spiriva   Pulmonary nodule  CT of chest showing a 8 mm nodule in the L upper lobe (new from prior study). Nodule suspicious for possible malignancy because patient has a 50 pack year smoking history. Discussed CT findings with the patient today and he expressed his understanding.  -Will suggest PCP  to pursue further workup such as PET scan and biopsy of the nodule as outpatient.  History of falls and fractures Burst fracture of T1 and compression fracture of T7 seen on imaging. However, no pain on palpation and normal ROM on physical exam. Patient is not complain of any back or shoulder pain at present.   Afib EKG on admission showing atrial flutter and QT prolongation (QTc 471).  -Amiodarone 200 mg daily -Cardizem 180 mg daily   CAD/ CHF -cardizem 180 mg daily  -Hold Lasix 40 mg daily.  Hypokalemia -Resolved. K normal today.  -Daily BMP, replete K as needed   Normocytic anemia Patient's Hgb in the 13s (up until 07/2014). Hgb 10.1 on admission. He denies having any unintentional weight loss, hematemesis, hematochezia, or melena. Anemia panel showing low iron, low saturation, ferritin on the lower limit of normal. TIBC normal. Likely due to anemia of acute illness.  -Pending FOBT  HLD -Lipitor 40 mg daily  DM -Levemir 5 u daily  -SSI -CBG monitoring   GERD -Protonix 40 mg BID  Osteoporosis  -Fosamax 70 mg qweekly -Caltrate 600 +D plus  Anxiety -Buspar 7.5 mg BID  Allergies -Claritin 10 mg daily  B12 deficiency -B12 2000 mcg daily  DVT ppx -Lovenox   Diet -cardiac -ensure   Code: DNR  Dispo: Disposition is deferred at this time, awaiting improvement of current medical  problems.  Anticipated discharge in approximately 1-2 day(s).   The patient does have a current PCP Marin Olp, MD) and does need an [facility] hospital follow-up appointment after discharge.  The patient does not have transportation limitations that hinder transportation to clinic appointments.  .Services Needed at time of discharge: Y = Yes, Blank = No PT:   OT:   RN:   Equipment:   Other:     LOS: 3 days   Shela Leff, MD 02/17/2015, 5:02 PM

## 2015-02-17 NOTE — Care Management Important Message (Signed)
Important Message  Patient Details  Name: [name] MRN: [id number] Date of Birth: 08-16-1940   Medicare Important Message Given:  Yes    Sritha Chauncey P Rosalena Mccorry 02/17/2015, 3:07 PM

## 2015-02-18 ENCOUNTER — Inpatient Hospital Stay (HOSPITAL_COMMUNITY): Payer: Medicare Other

## 2015-02-18 DIAGNOSIS — J189 Pneumonia, unspecified organism: Secondary | ICD-10-CM | POA: Insufficient documentation

## 2015-02-18 DIAGNOSIS — I2510 Atherosclerotic heart disease of native coronary artery without angina pectoris: Secondary | ICD-10-CM

## 2015-02-18 LAB — BASIC METABOLIC PANEL
ANION GAP: 7 (ref 5–15)
BUN: 21 mg/dL — AB (ref 6–20)
CALCIUM: 8.7 mg/dL — AB (ref 8.9–10.3)
CO2: 34 mmol/L — ABNORMAL HIGH (ref 22–32)
Chloride: 97 mmol/L — ABNORMAL LOW (ref 101–111)
Creatinine, Ser: 1.14 mg/dL (ref 0.61–1.24)
GFR calc Af Amer: >60 mL/min (ref >60)
GLUCOSE: 135 mg/dL — AB (ref 65–99)
Potassium: 4.2 mmol/L (ref 3.5–5.1)
SODIUM: 138 mmol/L (ref 135–145)

## 2015-02-18 LAB — CBC
HCT: 30.1 % — ABNORMAL LOW (ref 39.0–52.0)
Hemoglobin: 9.3 g/dL — ABNORMAL LOW (ref 13.0–17.0)
MCH: 26.7 pg (ref 26.0–34.0)
MCHC: 30.9 g/dL (ref 30.0–36.0)
MCV: 86.5 fL (ref 78.0–100.0)
PLATELETS: 250 10*3/uL (ref 150–400)
RBC: 3.48 MIL/uL — AB (ref 4.22–5.81)
RDW: 18 % — AB (ref 11.5–15.5)
WBC: 10.3 10*3/uL (ref 4.0–10.5)

## 2015-02-18 LAB — GLUCOSE, CAPILLARY
GLUCOSE-CAPILLARY: 126 mg/dL — AB (ref 65–99)
Glucose-Capillary: 179 mg/dL — ABNORMAL HIGH (ref 65–99)
Glucose-Capillary: 179 mg/dL — ABNORMAL HIGH (ref 65–99)
Glucose-Capillary: 282 mg/dL — ABNORMAL HIGH (ref 65–99)

## 2015-02-18 MED ORDER — IPRATROPIUM-ALBUTEROL 0.5-2.5 (3) MG/3ML IN SOLN
3.0000 mL | RESPIRATORY_TRACT | Status: DC
  Administered 2015-02-18 – 2015-02-19 (×4): 3 mL via RESPIRATORY_TRACT
  Filled 2015-02-18 (×5): qty 3

## 2015-02-18 MED ORDER — METHYLPREDNISOLONE SODIUM SUCC 125 MG IJ SOLR
60.0000 mg | Freq: Once | INTRAMUSCULAR | Status: AC
  Administered 2015-02-18: 60 mg via INTRAVENOUS
  Filled 2015-02-18: qty 2

## 2015-02-18 MED ORDER — AZITHROMYCIN 500 MG PO TABS
250.0000 mg | ORAL_TABLET | Freq: Every day | ORAL | Status: DC
Start: 2015-02-19 — End: 2015-02-19
  Administered 2015-02-19: 250 mg via ORAL
  Filled 2015-02-18: qty 1

## 2015-02-18 MED ORDER — PREDNISONE 50 MG PO TABS
60.0000 mg | ORAL_TABLET | Freq: Every day | ORAL | Status: DC
  Administered 2015-02-19: 60 mg via ORAL
  Filled 2015-02-18: qty 1

## 2015-02-18 NOTE — Progress Notes (Signed)
Inpatient Diabetes Program Recommendations  AACE/ADA: New Consensus Statement on Inpatient Glycemic Control (2015)  Target Ranges:  Prepandial:   less than 140 mg/dL      Peak postprandial:   less than 180 mg/dL (1-2 hours)      Critically ill patients:  140 - 180 mg/dL   Review of Glycemic Control  Diabetes history: DM 2 Outpatient Diabetes medications: Levemir 5 units Daily, Humalog 3 units TID Current orders for Inpatient glycemic control: Levemir 5 units QHS, Novolog Sensitive TID  Inpatient Diabetes Program Recommendations: Correction (SSI): Glucose 273 mg/dl at bedtime last night. Consider starting Novolog HS scale.  Thanks,  Tama Headings RN, MSN, [facility] Inpatient Diabetes Coordinator Team Pager [phone] (8a-5p)

## 2015-02-18 NOTE — Progress Notes (Signed)
Subjective: Patient was seen and examined at bedside today. Reports having SOB when laying flat, breathing is better when sitting up. Patient is still wheezing.   Objective: Vital signs in last 24 hours: Filed Vitals:   02/18/15 0007 02/18/15 0414 02/18/15 0653 02/18/15 1000  BP:  138/79  123/66  Pulse:  71  70  Temp:  98.1 F (36.7 C)  98 F (36.7 C)  TempSrc:  Oral  Oral  Resp:  18  18  Weight:      SpO2: 95% 96% 95% 99%   Weight change: 3.5 oz (0.1 kg)  Intake/Output Summary (Last 24 hours) at 02/18/15 1250 Last data filed at 02/18/15 0604  Gross per 24 hour  Intake    480 ml  Output      0 ml  Net    480 ml   Physical Exam: Constitutional: He is oriented to person, place, and time. No distress.  Cardiovascular: Normal rate and intact distal pulses.  Irregular rhythm  Pulmonary/Chest: Effort normal.  On 3L O2 via [place] Bilateral expiratory wheezing.  Abdominal: Soft. Bowel sounds are normal. He exhibits no distension. There is no tenderness.  Musculoskeletal: He exhibits no edema.  Skin: Skin is warm and dry.   Lab Results: Basic Metabolic Panel:  Recent Labs Lab 02/17/15 0352 02/18/15 0546  NA 137 138  K 4.4 4.2  CL 96* 97*  CO2 31 34*  GLUCOSE 185* 135*  BUN 17 21*  CREATININE 1.00 1.14  CALCIUM 8.9 8.7*   Liver Function Tests:  Recent Labs Lab 02/14/15 0957 02/15/15 0344  AST 24 19  ALT 29 27  ALKPHOS 47 48  BILITOT 1.3* 0.6  PROT 6.1* 5.8*  ALBUMIN 2.8* 2.5*   CBC:  Recent Labs Lab 02/14/15 0957  02/15/15 0344  02/17/15 0352 02/18/15 0546  WBC 12.5*  < > 10.2  < > 9.2 10.3  NEUTROABS 10.6*  --  8.1*  --   --   --   HGB 10.1*  < > 9.7*  < > 10.3* 9.3*  HCT 33.1*  < > 32.3*  < > 34.2* 30.1*  MCV 86.0  < > 86.1  < > 86.1 86.5  PLT 164  < > 189  < > 249 250  < > = values in this interval not displayed. Cardiac Enzymes:  Recent Labs Lab 02/14/15 1533 02/14/15 2148  TROPONINI 0.03 0.03   CBG:  Recent Labs Lab  02/17/15 0742 02/17/15 1138 02/17/15 1654 02/17/15 2031 02/18/15 0727 02/18/15 1123  GLUCAP 157* 174* 168* 273* 126* 179*   Anemia Panel:  Recent Labs Lab 02/15/15 0344  VITAMINB12 1133*  FOLATE 16.1  FERRITIN 67  TIBC 329  IRON 14*  RETICCTPCT 1.2   Urinalysis:  Recent Labs Lab 02/14/15 1133  COLORURINE YELLOW  LABSPEC 1.015  PHURINE 6.0  GLUCOSEU NEGATIVE  HGBUR NEGATIVE  BILIRUBINUR NEGATIVE  KETONESUR NEGATIVE  PROTEINUR NEGATIVE  NITRITE NEGATIVE  LEUKOCYTESUR NEGATIVE   Micro Results: Recent Results (from the past 240 hour(s))  Culture, blood (routine x 2)     Status: None (Preliminary result)   Collection Time: 02/14/15  9:57 AM  Result Value Ref Range Status   Specimen Description BLOOD RIGHT ANTECUBITAL  Final   Special Requests BOTTLES DRAWN AEROBIC AND ANAEROBIC 5CC  Final   Culture NO GROWTH 4 DAYS  Final   Report Status PENDING  Incomplete  Culture, blood (routine x 2)     Status: None (Preliminary result)  Collection Time: 02/14/15 10:01 AM  Result Value Ref Range Status   Specimen Description BLOOD LEFT ANTECUBITAL  Final   Special Requests BOTTLES DRAWN AEROBIC AND ANAEROBIC 10CC  Final   Culture NO GROWTH 4 DAYS  Final   Report Status PENDING  Incomplete  Urine culture     Status: None   Collection Time: 02/14/15 11:33 AM  Result Value Ref Range Status   Specimen Description URINE, CLEAN CATCH  Final   Special Requests NONE  Final   Culture MULTIPLE SPECIES PRESENT, SUGGEST RECOLLECTION  Final   Report Status 02/15/2015 FINAL  Final   Studies/Results: Dg Chest 2 View  02/18/2015  CLINICAL DATA:  Shortness of breath, COPD, heart failure. EXAM: CHEST  2 VIEW COMPARISON:  Chest x-ray and chest CT dated 02/14/2015. FINDINGS: Cardiomediastinal silhouette is stable in size and configuration. Surgical changes of the mediastinum again noted. Small hiatal hernia noted. The patchy airspace opacities within each lung are stable in extent. No new  lung findings seen. Again noted are small pleural effusions, better seen on previous CT. Lungs are hyperexpanded consistent with the COPD/emphysema also identified on the recent chest CT. The compression fracture deformity within the mid thoracic spine is stable with associated thoracic spine kyphosis. No acute- appearing osseous abnormality identified. IMPRESSION: Stable chest x-ray. The bilateral airspace opacities are not significantly changed compared to the chest x-ray of 02/14/2015, compatible with multifocal pneumonias as described on the chest CT also performed on 02/14/2015. Electronically Signed   By: Franki Cabot M.D.   On: 02/18/2015 09:49   Medications: I have reviewed the patient's current medications. Scheduled Meds: . amiodarone  200 mg Oral Daily  . antiseptic oral rinse  7 mL Mouth Rinse q12n4p  . atorvastatin  40 mg Oral Daily  . budesonide-formoterol  2 puff Inhalation BID  . busPIRone  7.5 mg Oral BID  . calcium-vitamin D  1 tablet Oral Q breakfast  . cefTRIAXone (ROCEPHIN)  IV  1 g Intravenous Q24H  . chlorhexidine  15 mL Mouth Rinse BID  . dextromethorphan-guaiFENesin  1 tablet Oral BID  . diltiazem  180 mg Oral Daily  . enoxaparin (LOVENOX) injection  40 mg Subcutaneous Q24H  . feeding supplement (ENSURE ENLIVE)  237 mL Oral Q1500  . ferrous sulfate  325 mg Oral QHS  . furosemide  40 mg Oral Daily  . Influenza vac split quadrivalent PF  0.5 mL Intramuscular Tomorrow-1000  . insulin aspart  0-9 Units Subcutaneous TID WC  . insulin detemir  5 Units Subcutaneous Daily  . ipratropium-albuterol  3 mL Nebulization Q6H  . loratadine  10 mg Oral Daily  . pantoprazole  40 mg Oral BID  . tiotropium  1 capsule Inhalation Daily  . cyanocobalamin  2,000 mcg Oral Daily   Continuous Infusions:  PRN Meds:.acetaminophen, albuterol Assessment/Plan: Principal Problem:   CAP (community acquired pneumonia) Active Problems:   Hyperlipidemia   ANEMIA, B12 DEFICIENCY   Essential  hypertension   Hx of CABG   Osteoporosis   Diastolic CHF ([place])   Diabetes mellitus type II, controlled (HCC)   COPD (chronic obstructive pulmonary disease) (HCC)   Chronic atrial fibrillation (HCC)   Compression fracture of L1 lumbar vertebra (HCC)   CAD (coronary artery disease)   Iron deficiency anemia  CAP Patient has a history of bullous emphysema and presented with multifocal bilateral pneumonia. He had mild leukocytosis (WBC 12.5) on admission. Leukocytosis has now resolved. However, still continues to have bilateral expiratory wheezing. Patient  is satting 94-98% on 3L O2 via [place]. He is on 2L O2 at home. Repeat CXR today did not show any changes/ improvement.   -Ceftriaxone 1 g IV every 24 hours -Azithromycin 250 mg daily for 4 days total  -Solumedrol IV 60 mg  -Mucinex DM  -Albuterol nebulizer -Budesonide-formoterol inhaler -Spiriva  -CBC in am  COPD Patient has a 50 pack year smoking history, quit 3-4 years ago. CT of lungs showing chronic lung scarring and moderate-advanced emphysema.  -Albuterol nebulizer -Budesonide-formoterol inhaler -Spiriva   Pulmonary nodule  CT of chest showing a 8 mm nodule in the L upper lobe (new from prior study). Nodule suspicious for possible malignancy because patient has a 50 pack year smoking history. Discussed CT findings with the patient today and he expressed his understanding.  -Will suggest PCP to pursue further workup such as PET scan and biopsy of the nodule as outpatient.  History of falls and fractures Burst fracture of T1 and compression fracture of T7 seen on imaging. However, no pain on palpation and normal ROM on physical exam. Patient is not complain of any back or shoulder pain at present.   Afib EKG on admission showing atrial flutter and QT prolongation (QTc 471).  -Amiodarone 200 mg daily -Cardizem 180 mg daily   CAD/ CHF -cardizem 180 mg daily  -Hold Lasix 40 mg daily.  Hypokalemia -Resolved. K normal  today.  -Daily BMP, replete K as needed   Normocytic anemia Patient's Hgb in the 13s (up until 07/2014). Hgb 10.1 on admission. He denies having any unintentional weight loss, hematemesis, hematochezia, or melena. Anemia panel showing low iron, low saturation, ferritin on the lower limit of normal. TIBC normal. Likely due to anemia of acute illness.  -Pending FOBT  HLD -Lipitor 40 mg daily  DM -Levemir 5 u daily  -SSI -CBG monitoring   GERD -Protonix 40 mg BID  Osteoporosis  -Fosamax 70 mg qweekly -Caltrate 600 +D plus  Anxiety -Buspar 7.5 mg BID  Allergies -Claritin 10 mg daily  B12 deficiency -B12 2000 mcg daily  DVT ppx -Lovenox   Diet -cardiac -ensure   Code: DNR  Dispo: Disposition is deferred at this time, awaiting improvement of current medical problems.  Anticipated discharge in approximately 1-2 day(s).   The patient does have a current PCP Marin Olp, MD) and does need an [facility] hospital follow-up appointment after discharge.  The patient does not have transportation limitations that hinder transportation to clinic appointments.  .Services Needed at time of discharge: Y = Yes, Blank = No PT:   OT:   RN:   Equipment:   Other:     LOS: 4 days   Shela Leff, MD 02/18/2015, 12:50 PM

## 2015-02-18 NOTE — Progress Notes (Signed)
Patient ID: [name], male   DOB: 11/18/1940, 74 y.o.   MRN: [id number] Medicine attending: I personally examined this patient together with resident physician Dr. Shela Leff. Date #4 Rocephin plus oral azithromycin for multifocal bilateral pneumonia in a man with advanced stage oxygen dependent obstructive airway disease. Although oxygen saturations are stable on 3 L, he has persistent, diffuse, bilateral, expiratory wheezes. Respiratory distress with minimal movement in bed such as trying to lie down. A repeat chest radiograph does not show any progressive changes however in view of his age, advanced pulmonary disease, failure to improve significantly clinically, continued hospitalization indicated. I feel benefit of a brief course of parenteral steroids outweighs risk. After discussing situation with his son the other day, it was probably a coincidence that the patient developed transient confusion after he got a dose of steroids since son reports his father has had intermittent confusion now going on for one year. We will monitor his status closely however.

## 2015-02-18 NOTE — Care Management Note (Signed)
Case Management Note  Patient Details  Name: MERION PETTUS MRN: [id number] Date of Birth: 1940/07/23  Subjective/Objective:         CM following for progression and d/c planning.           Action/Plan: 02/18/2015 Noted pt continues to have bil wheezing and is receiving steroids , will continue to follow.   Expected Discharge Date:    02/20/2015              Expected Discharge Plan:  [place]  In-House Referral:  Clinical Social Work  Discharge planning Services  NA  Post Acute Care Choice:  NA Choice offered to:  NA  DME Arranged:   NA DME Agency:   NA  HH Arranged:   NA HH Agency:   NA  Status of Service:  Completed, signed off  Medicare Important Message Given:  Yes Date Medicare IM Given:    Medicare IM give by:    Date Additional Medicare IM Given:    Additional Medicare Important Message give by:     If discussed at [place] of Stay Meetings, dates discussed:    Additional Comments:  Adron Bene, RN 02/18/2015, 12:48 PM

## 2015-02-18 NOTE — Clinical Social Work Note (Addendum)
CSW talked with MD regarding patient's readiness for discharge. Per Dr. Marlowe Sax, patient not medically stable today. When asked, she is not sure if patient will be ready on Thursday.  CSW will notify 11/24 CSW telephonic coverage regarding patient.  Call made to [facility], admissions director at [facility] skilled nursing facility to update her about possible d/c dates for patient. Anderson Malta indicated that she will contact [place], [place]. to complete admissions paperwork. Per Anderson Malta, if patient discharges on Thursday, she will need d/c summary before 12:30 pm.  If on Friday, she will be at work all day.  MD contacted and updated.   Curtez Brallier Givens, MSW, LCSW Licensed Clinical Social Worker [place] [phone]

## 2015-02-19 DIAGNOSIS — S32010D Wedge compression fracture of first lumbar vertebra, subsequent encounter for fracture with routine healing: Secondary | ICD-10-CM

## 2015-02-19 DIAGNOSIS — M7020 Olecranon bursitis, unspecified elbow: Secondary | ICD-10-CM | POA: Diagnosis not present

## 2015-02-19 DIAGNOSIS — R911 Solitary pulmonary nodule: Secondary | ICD-10-CM

## 2015-02-19 DIAGNOSIS — I2510 Atherosclerotic heart disease of native coronary artery without angina pectoris: Secondary | ICD-10-CM | POA: Diagnosis not present

## 2015-02-19 DIAGNOSIS — Z7901 Long term (current) use of anticoagulants: Secondary | ICD-10-CM | POA: Diagnosis not present

## 2015-02-19 DIAGNOSIS — I482 Chronic atrial fibrillation: Secondary | ICD-10-CM | POA: Diagnosis not present

## 2015-02-19 DIAGNOSIS — M6281 Muscle weakness (generalized): Secondary | ICD-10-CM | POA: Diagnosis not present

## 2015-02-19 DIAGNOSIS — I5030 Unspecified diastolic (congestive) heart failure: Secondary | ICD-10-CM | POA: Diagnosis not present

## 2015-02-19 DIAGNOSIS — R2681 Unsteadiness on feet: Secondary | ICD-10-CM | POA: Diagnosis not present

## 2015-02-19 DIAGNOSIS — M810 Age-related osteoporosis without current pathological fracture: Secondary | ICD-10-CM | POA: Diagnosis not present

## 2015-02-19 DIAGNOSIS — R29898 Other symptoms and signs involving the musculoskeletal system: Secondary | ICD-10-CM | POA: Diagnosis not present

## 2015-02-19 DIAGNOSIS — Z951 Presence of aortocoronary bypass graft: Secondary | ICD-10-CM | POA: Diagnosis not present

## 2015-02-19 DIAGNOSIS — Z9981 Dependence on supplemental oxygen: Secondary | ICD-10-CM | POA: Diagnosis not present

## 2015-02-19 DIAGNOSIS — Z86010 Personal history of colonic polyps: Secondary | ICD-10-CM | POA: Diagnosis not present

## 2015-02-19 DIAGNOSIS — Z9181 History of falling: Secondary | ICD-10-CM | POA: Diagnosis not present

## 2015-02-19 DIAGNOSIS — R0602 Shortness of breath: Secondary | ICD-10-CM | POA: Diagnosis not present

## 2015-02-19 DIAGNOSIS — K219 Gastro-esophageal reflux disease without esophagitis: Secondary | ICD-10-CM | POA: Diagnosis not present

## 2015-02-19 DIAGNOSIS — J189 Pneumonia, unspecified organism: Secondary | ICD-10-CM | POA: Diagnosis not present

## 2015-02-19 DIAGNOSIS — J449 Chronic obstructive pulmonary disease, unspecified: Secondary | ICD-10-CM | POA: Diagnosis not present

## 2015-02-19 DIAGNOSIS — E119 Type 2 diabetes mellitus without complications: Secondary | ICD-10-CM | POA: Diagnosis not present

## 2015-02-19 LAB — CULTURE, BLOOD (ROUTINE X 2)
CULTURE: NO GROWTH
Culture: NO GROWTH

## 2015-02-19 LAB — GLUCOSE, CAPILLARY
GLUCOSE-CAPILLARY: 228 mg/dL — AB (ref 65–99)
GLUCOSE-CAPILLARY: 312 mg/dL — AB (ref 65–99)

## 2015-02-19 LAB — CBC
HEMATOCRIT: 33 % — AB (ref 39.0–52.0)
Hemoglobin: 9.8 g/dL — ABNORMAL LOW (ref 13.0–17.0)
MCH: 25.5 pg — AB (ref 26.0–34.0)
MCHC: 29.7 g/dL — ABNORMAL LOW (ref 30.0–36.0)
MCV: 85.7 fL (ref 78.0–100.0)
Platelets: 281 10*3/uL (ref 150–400)
RBC: 3.85 MIL/uL — AB (ref 4.22–5.81)
RDW: 18.1 % — ABNORMAL HIGH (ref 11.5–15.5)
WBC: 5.6 10*3/uL (ref 4.0–10.5)

## 2015-02-19 LAB — BASIC METABOLIC PANEL
Anion gap: 8 (ref 5–15)
BUN: 24 mg/dL — AB (ref 6–20)
CALCIUM: 8.8 mg/dL — AB (ref 8.9–10.3)
CHLORIDE: 96 mmol/L — AB (ref 101–111)
CO2: 34 mmol/L — ABNORMAL HIGH (ref 22–32)
CREATININE: 1 mg/dL (ref 0.61–1.24)
Glucose, Bld: 257 mg/dL — ABNORMAL HIGH (ref 65–99)
Potassium: 4.4 mmol/L (ref 3.5–5.1)
Sodium: 138 mmol/L (ref 135–145)

## 2015-02-19 MED ORDER — PREDNISONE 20 MG PO TABS: 40.0000 mg | ORAL_TABLET | Freq: Every day | ORAL | Status: DC

## 2015-02-19 MED ORDER — DM-GUAIFENESIN ER 30-600 MG PO TB12: 1.0000 | ORAL_TABLET | Freq: Two times a day (BID) | ORAL | Status: DC

## 2015-02-19 NOTE — Discharge Summary (Signed)
Name: [name] MRN: [id number] DOB: [birth year] 74 y.o. PCP: Marin Olp, MD  Date of Admission: 02/14/2015  9:01 AM Date of Discharge: 02/19/2015 Attending Physician: Annia Belt, MD  Discharge Diagnosis:  Principal Problem:   CAP (community acquired pneumonia) Active Problems:   Hyperlipidemia   ANEMIA, B12 DEFICIENCY   Essential hypertension   Hx of CABG   Osteoporosis   Diastolic CHF ([place])   Diabetes mellitus type II, controlled ([place])   COPD (chronic obstructive pulmonary disease) ([place])   Chronic atrial fibrillation (HCC)   Compression fracture of L1 lumbar vertebra (HCC)   CAD (coronary artery disease)   Iron deficiency anemia   HCAP (healthcare-associated pneumonia)  Discharge Medications:   Medication List    TAKE these medications        acetaminophen 500 MG tablet  Commonly known as:  TYLENOL  Take 500 mg by mouth every 6 (six) hours as needed for mild pain or moderate pain.     albuterol (2.5 MG/3ML) 0.083% nebulizer solution  Commonly known as:  PROVENTIL  Take 2.5 mg by nebulization every 6 (six) hours as needed for wheezing or shortness of breath.     alendronate 70 MG tablet  Commonly known as:  FOSAMAX  Take 70 mg by mouth once a week. Take with a full glass of water on an empty stomach.     amiodarone 200 MG tablet  Commonly known as:  PACERONE  TAKE 1 TABLET (200 MG TOTAL) BY MOUTH DAILY.     atorvastatin 40 MG tablet  Commonly known as:  LIPITOR  Take 40 mg by mouth daily.     budesonide-formoterol 160-4.5 MCG/ACT inhaler  Commonly known as:  SYMBICORT  Inhale 2 puffs into the lungs 2 (two) times daily.     busPIRone 7.5 MG tablet  Commonly known as:  BUSPAR  Take 7.5 mg by mouth 2 (two) times daily.     CALTRATE 600+D PLUS 600-400 MG-UNIT per tablet  Chew 1 tablet by mouth daily.     cyanocobalamin 2000 MCG tablet  Commonly known as:  [employer] VITAMIN B12  Take 1 tablet (2,000 mcg total) by mouth daily.     dextromethorphan-guaiFENesin 30-600 MG 12hr tablet  Commonly known as:  MUCINEX DM  Take 1 tablet by mouth 2 (two) times daily.     diltiazem 180 MG 24 hr capsule  Commonly known as:  CARDIZEM CD  Take 1 capsule (180 mg total) by mouth daily.     furosemide 40 MG tablet  Commonly known as:  LASIX  TAKE 1 TABLET BY MOUTH EVERY DAY     insulin detemir 100 UNIT/ML injection  Commonly known as:  LEVEMIR  Inject 0.05 mLs (5 Units total) into the skin daily.     insulin lispro 100 UNIT/ML injection  Commonly known as:  HUMALOG  Inject 3 Units into the skin 3 (three) times daily before meals.     OXYGEN  Inhale 2 L into the lungs continuous.     pantoprazole 40 MG tablet  Commonly known as:  PROTONIX  TAKE 1 TABLET BY MOUTH TWICE A DAY     potassium chloride SA 20 MEQ tablet  Commonly known as:  K-DUR,KLOR-CON  Take 1 tablet (20 mEq total) by mouth daily.     predniSONE 20 MG tablet  Commonly known as:  DELTASONE  Take 2 tablets (40 mg total) by mouth daily with breakfast.     SPIRIVA HANDIHALER 18 MCG inhalation  capsule  Generic drug:  tiotropium  INHALE CONTENTS OF 1 CAPSULE DAILY     ZYRTEC ALLERGY 10 MG tablet  Generic drug:  cetirizine  Take 1 tablet (10 mg total) by mouth every morning.        Disposition and follow-up:   [name] was discharged from [facility] in Stable condition.  At the hospital follow up visit please address:  1.  Please make sure patient follows up with his PCP within 2 weeks.   8 mm Pulmonary nodule - Will suggest PCP to pursue further workup such as PET scan and biopsy of the nodule as outpatient.  Normocytic anemia - please recheck CBC at follow up visit.   2.  Labs / imaging needed at time of follow-up: CBC  3.  Pending labs/ test needing follow-up:  Follow-up Appointments: Follow-up Information    Please follow up.   Why:  [facility], [place], [place] Friday, Mar 20, 2015 @ 1:30pm        Please  follow up.   Why:  [facility] Social [facility], Drue Stager @ [phone]     Procedures Performed:  Dg Chest 2 View  02/18/2015  CLINICAL DATA:  Shortness of breath, COPD, heart failure. EXAM: CHEST  2 VIEW COMPARISON:  Chest x-ray and chest CT dated 02/14/2015. FINDINGS: Cardiomediastinal silhouette is stable in size and configuration. Surgical changes of the mediastinum again noted. Small hiatal hernia noted. The patchy airspace opacities within each lung are stable in extent. No new lung findings seen. Again noted are small pleural effusions, better seen on previous CT. Lungs are hyperexpanded consistent with the COPD/emphysema also identified on the recent chest CT. The compression fracture deformity within the mid thoracic spine is stable with associated thoracic spine kyphosis. No acute- appearing osseous abnormality identified. IMPRESSION: Stable chest x-ray. The bilateral airspace opacities are not significantly changed compared to the chest x-ray of 02/14/2015, compatible with multifocal pneumonias as described on the chest CT also performed on 02/14/2015. Electronically Signed   By: Franki Cabot M.D.   On: 02/18/2015 09:49   Dg Chest 2 View  02/14/2015  CLINICAL DATA:  Chest pain and shortness of breath. Wheezing and congestion EXAM: CHEST  2 VIEW COMPARISON:  08/28/2014 FINDINGS: Heart size is normal. Previous median sternotomy and CABG procedure. There are bilateral pleural effusions and interstitial edema. There are also patchy airspace opacities identified in both lungs. IMPRESSION: 1. CHF. 2. Bilateral airspace opacities which may represent areas of alveolar edema or superimposed infection. Electronically Signed   By: Kerby Moors M.D.   On: 02/14/2015 10:32   Ct Chest Wo Contrast  02/14/2015  CLINICAL DATA:  CHF versus pneumonia. Patient with hemoptysis. History of COPD. EXAM: CT CHEST WITHOUT CONTRAST TECHNIQUE: Multidetector CT imaging of the chest was performed following the  standard protocol without IV contrast. COMPARISON:  Chest radiograph, 02/14/2015.  Chest CT, 08/25/2014. FINDINGS: Thoracic inlet: No mass or adenopathy. Status post left thyroidectomy. Right thyroid lobe is unremarkable. Mediastinum and hila: Heart is borderline enlarged. There are dense coronary artery calcifications. Great vessels are normal caliber. Atherosclerotic calcifications are noted along the thoracic aorta and branch vessels. No mediastinal or hilar masses or pathologically enlarged lymph nodes. Small to moderate size hiatal hernia. Lungs and pleura: Minimal, right greater than left, pleural effusions. Patchy airspace consolidation is noted in the right lower lobe and posterior aspect of the right upper lobe, and to a lesser degree, right middle  lobe. Dependent opacity in the left lower lobe is also noted that is likely due to atelectasis. There is hazy opacity in the left lower lobe superior segment similar to that seen in the right lower lobe. There is an area of focal opacity in the left upper lobe lingula consistent with chronic scarring. Small spiculated nodule in the left upper lobe, image 26, series 205, measuring 8 mm, new from the prior CT. No other lung nodules. There is moderate to advanced emphysema similar to the prior study. No pneumothorax. Limited upper abdomen: Low-density renal masses consistent with cysts. No liver lesion. No adrenal masses. Status postcholecystectomy. Musculoskeletal: Burst fracture of L1, severe, increased in severity since the prior CT. Moderate to severe compression fracture of T7, stable. Slight depressions of the upper endplates of T4 through T6, stable. Bones are diffusely demineralized. No osteoblastic or osteolytic lesions. Stable changes from previous CABG surgery. IMPRESSION: 1. Multifocal pneumonia reflected by airspace opacity most evident in the posterior right upper lobe and right lower lobe, but also seen in the superior segment the left lower lobe and  mildly in the right middle lobe. Associated minimal pleural effusions. 2. 8 mm nodule in the left upper lobe, new from the prior study, concerning for malignancy. Recommend consultation for possible biopsy. 3. There are areas of chronic lung scarring as well as moderate to advanced emphysema. 4. No mediastinal or hilar masses or pathologically enlarged lymph nodes. 5. Thoracic and upper lumbar spine fractures as described. Electronically Signed   By: Lajean Manes M.D.   On: 02/14/2015 12:23   Admission HPI: Patient is a 74 yo M with a PMHx of HTN, HLD, MI, CAD, A-fib, COPD, CHF, carotid artery stenosis, PVD, B12 deficiency, GERD, nephrolithiasis, osteoporosis brought into [facility] by EMS after patient experienced nausea and sudden onset CP last night. Patient states he experienced nausea around 11 pm last night and L sided back/ shoulder pain around 3 am this morning. States his brother-in-law lives with him and called EMS early this morning. Denies having pain chest pain. Patient states he was told about 3 days ago he had an episode of "confusion, jumping off the bed, falling, and saying things to people." States he did not remember any of these events when he "woke up." Denies having any dysuria, frequency, or urgency. Patient also reports having worsening SOB, cough productive of grey sputum, and wheezing for the past 1 week. Reports having fatigue and chills. Denies having any fevers or recent sick contacts. Denies having any rhinorrhea or sneezing. Reports having a 50 pack year smoking history, quit 3-4 years ago. Patient has a history of falls and fractures (burst fracture of T1 and compression fracture of T7). States he has a walker at home but uses it "sometimes." Denies having any weakness, numbness, or tingling anywhere. Denies having any unintentional weight loss. Denies having any hematemesis, hematochezia, or melena.   EMS gave him ASA 325 mg and one dose of nitroglycerin. He had audible wheezing and a  congested cough at the time which cleared up with Albuterol and Atrovent.  Patient has a history of A-fib and is supposed to be on Pradaxa as per discharge summary from 08/2014. States his "blood thinner" was stopped when he was at [facility] 3 months ago and he is not sure why. Patient wears 2LO2 at home at all times.   Patient is followed at [facility] for his primary care doctor. Followed by Dr. Burt Knack from cardiology.  Hospital Course by  problem list: Principal Problem:   CAP (community acquired pneumonia) Active Problems:   Hyperlipidemia   ANEMIA, B12 DEFICIENCY   Essential hypertension   Hx of CABG   Osteoporosis   Diastolic CHF ([place])   Diabetes mellitus type II, controlled ([place])   COPD (chronic obstructive pulmonary disease) (HCC)   Chronic atrial fibrillation (HCC)   Compression fracture of L1 lumbar vertebra (HCC)   CAD (coronary artery disease)   Iron deficiency anemia   HCAP (healthcare-associated pneumonia)   CAP Patient has a history of bullous emphysema and presented with multifocal bilateral pneumonia. He had mild leukocytosis (WBC 12.5) on admission. He was treated with antibiotics (ceftriaxone and azithromycin), solumedrol, albuterol, budesonide-formoterol, and tiotropium. Leukocytosis resolved. On the day of discharge, lungs CTAB on exam. Patient was satting 93% on 2L O2 via [place]. He is on 2L O2 at home. Patient is to continue taking Prednisone 40 mg for 7 days after discharge. Please schedule him for a follow up appointment with his PCP within 2 weeks.   Pulmonary nodule  CT of chest on admission showing a 8 mm nodule in the L upper lobe (new from prior study). Nodule suspicious for possible malignancy because patient has a 50 pack year smoking history. Discussed CT findings with the patient and he expressed his understanding.  -Will suggest PCP to pursue further workup such as PET scan and biopsy of the nodule as outpatient.  Normocytic anemia Patient's baseline  Hgb was in the 13s (up until 07/2014). Hgb 10.1 on admission. He denied having any unintentional weight loss, hematemesis, hematochezia, or melena. Last colonoscopy from 2013 showing hyperplastic polyps, mild diverticulosis, and internal hemorrhoids. Anemia panel showing low iron, low saturation, ferritin on the lower limit of normal. TIBC normal. Likely due to anemia of acute illness. Will request PCP to recheck CBC at f/u visit.  Discharge Vitals:   BP 133/76 mmHg  Pulse 69  Temp(Src) 98.1 F (36.7 C) (Oral)  Resp 22  Wt 141 lb 8.6 oz (64.2 kg)  SpO2 93%  Discharge Labs:  Results for orders placed or performed during the hospital encounter of 02/14/15 (from the past 24 hour(s))  Glucose, capillary     Status: Abnormal   Collection Time: 02/18/15 11:23 AM  Result Value Ref Range   Glucose-Capillary 179 (H) 65 - 99 mg/dL  Glucose, capillary     Status: Abnormal   Collection Time: 02/18/15  4:48 PM  Result Value Ref Range   Glucose-Capillary 179 (H) 65 - 99 mg/dL  Glucose, capillary     Status: Abnormal   Collection Time: 02/18/15  8:10 PM  Result Value Ref Range   Glucose-Capillary 282 (H) 65 - 99 mg/dL  CBC     Status: Abnormal   Collection Time: 02/19/15  3:46 AM  Result Value Ref Range   WBC 5.6 4.0 - 10.5 K/uL   RBC 3.85 (L) 4.22 - 5.81 MIL/uL   Hemoglobin 9.8 (L) 13.0 - 17.0 g/dL   HCT 33.0 (L) 39.0 - 52.0 %   MCV 85.7 78.0 - 100.0 fL   MCH 25.5 (L) 26.0 - 34.0 pg   MCHC 29.7 (L) 30.0 - 36.0 g/dL   RDW 18.1 (H) 11.5 - 15.5 %   Platelets 281 150 - 400 K/uL  Basic metabolic panel     Status: Abnormal   Collection Time: 02/19/15  3:46 AM  Result Value Ref Range   Sodium 138 135 - 145 mmol/L   Potassium 4.4 3.5 - 5.1 mmol/L  Chloride 96 (L) 101 - 111 mmol/L   CO2 34 (H) 22 - 32 mmol/L   Glucose, Bld 257 (H) 65 - 99 mg/dL   BUN 24 (H) 6 - 20 mg/dL   Creatinine, Ser 1.00 0.61 - 1.24 mg/dL   Calcium 8.8 (L) 8.9 - 10.3 mg/dL   GFR calc non Af Amer >60 >60 mL/min   GFR  calc Af Amer >60 >60 mL/min   Anion gap 8 5 - 15  Glucose, capillary     Status: Abnormal   Collection Time: 02/19/15  7:24 AM  Result Value Ref Range   Glucose-Capillary 228 (H) 65 - 99 mg/dL    Signed: Shela Leff, MD 02/19/2015, 10:25 AM    Services Ordered on Discharge:  Equipment Ordered on Discharge:

## 2015-02-19 NOTE — Progress Notes (Signed)
[name] to be D/C'd [name] per MD order.Call facility RN    Medication List    TAKE these medications        acetaminophen 500 MG tablet  Commonly known as:  TYLENOL  Take 500 mg by mouth every 6 (six) hours as needed for mild pain or moderate pain.     albuterol (2.5 MG/3ML) 0.083% nebulizer solution  Commonly known as:  PROVENTIL  Take 2.5 mg by nebulization every 6 (six) hours as needed for wheezing or shortness of breath.     alendronate 70 MG tablet  Commonly known as:  FOSAMAX  Take 70 mg by mouth once a week. Take with a full glass of water on an empty stomach.     amiodarone 200 MG tablet  Commonly known as:  PACERONE  TAKE 1 TABLET (200 MG TOTAL) BY MOUTH DAILY.     atorvastatin 40 MG tablet  Commonly known as:  LIPITOR  Take 40 mg by mouth daily.     budesonide-formoterol 160-4.5 MCG/ACT inhaler  Commonly known as:  SYMBICORT  Inhale 2 puffs into the lungs 2 (two) times daily.     busPIRone 7.5 MG tablet  Commonly known as:  BUSPAR  Take 7.5 mg by mouth 2 (two) times daily.     CALTRATE 600+D PLUS 600-400 MG-UNIT per tablet  Chew 1 tablet by mouth daily.     cyanocobalamin 2000 MCG tablet  Commonly known as:  [employer] VITAMIN B12  Take 1 tablet (2,000 mcg total) by mouth daily.     dextromethorphan-guaiFENesin 30-600 MG 12hr tablet  Commonly known as:  MUCINEX DM  Take 1 tablet by mouth 2 (two) times daily.     diltiazem 180 MG 24 hr capsule  Commonly known as:  CARDIZEM CD  Take 1 capsule (180 mg total) by mouth daily.     furosemide 40 MG tablet  Commonly known as:  LASIX  TAKE 1 TABLET BY MOUTH EVERY DAY     insulin detemir 100 UNIT/ML injection  Commonly known as:  LEVEMIR  Inject 0.05 mLs (5 Units total) into the skin daily.     insulin lispro 100 UNIT/ML injection  Commonly known as:  HUMALOG  Inject 3 Units into the skin 3 (three) times daily before meals.     OXYGEN  Inhale 2 L into the lungs continuous.     pantoprazole 40  MG tablet  Commonly known as:  PROTONIX  TAKE 1 TABLET BY MOUTH TWICE A DAY     potassium chloride SA 20 MEQ tablet  Commonly known as:  K-DUR,KLOR-CON  Take 1 tablet (20 mEq total) by mouth daily.     predniSONE 20 MG tablet  Commonly known as:  DELTASONE  Take 2 tablets (40 mg total) by mouth daily with breakfast.     SPIRIVA HANDIHALER 18 MCG inhalation capsule  Generic drug:  tiotropium  INHALE CONTENTS OF 1 CAPSULE DAILY     ZYRTEC ALLERGY 10 MG tablet  Generic drug:  cetirizine  Take 1 tablet (10 mg total) by mouth every morning.        Filed Vitals:   02/19/15 0522 02/19/15 0805  BP: 130/61 133/76  Pulse: 70 69  Temp: 98.4 F (36.9 C) 98.1 F (36.7 C)  Resp: 20 22    An After Visit Summary was printed and given to the patient. Patient escorted via stretcher and DC via [place] 02/19/2015 12:36 PM

## 2015-02-19 NOTE — Progress Notes (Signed)
Subjective: Patient was seen and examined at bedside today. He is not wheezing anymore and states his breathing is at his baseline.   Objective: Vital signs in last 24 hours: Filed Vitals:   02/18/15 2336 02/19/15 0343 02/19/15 0522 02/19/15 0805  BP:   130/61 133/76  Pulse:   70 69  Temp:   98.4 F (36.9 C) 98.1 F (36.7 C)  TempSrc:   Oral Oral  Resp:   20 22  Weight:      SpO2: 95% 95% 93% 93%   Weight change: -3.5 oz (-0.1 kg)  Intake/Output Summary (Last 24 hours) at 02/19/15 0942 Last data filed at 02/19/15 0900  Gross per 24 hour  Intake   1402 ml  Output   1425 ml  Net    -23 ml   Physical Exam: Constitutional: He is oriented to person, place, and time. No distress.  Cardiovascular: Normal rate and intact distal pulses.  Irregular rhythm  Pulmonary/Chest: Effort normal.  On 2L O2 via [place] CTAB. No wheezes, rales, or rhonchi appreciated.  Abdominal: Soft. Bowel sounds are normal. He exhibits no distension. There is no tenderness.  Musculoskeletal: He exhibits no edema.  Skin: Skin is warm and dry.   Lab Results: Basic Metabolic Panel:  Recent Labs Lab 02/18/15 0546 02/19/15 0346  NA 138 138  K 4.2 4.4  CL 97* 96*  CO2 34* 34*  GLUCOSE 135* 257*  BUN 21* 24*  CREATININE 1.14 1.00  CALCIUM 8.7* 8.8*   Liver Function Tests:  Recent Labs Lab 02/14/15 0957 02/15/15 0344  AST 24 19  ALT 29 27  ALKPHOS 47 48  BILITOT 1.3* 0.6  PROT 6.1* 5.8*  ALBUMIN 2.8* 2.5*   CBC:  Recent Labs Lab 02/14/15 0957  02/15/15 0344  02/18/15 0546 02/19/15 0346  WBC 12.5*  < > 10.2  < > 10.3 5.6  NEUTROABS 10.6*  --  8.1*  --   --   --   HGB 10.1*  < > 9.7*  < > 9.3* 9.8*  HCT 33.1*  < > 32.3*  < > 30.1* 33.0*  MCV 86.0  < > 86.1  < > 86.5 85.7  PLT 164  < > 189  < > 250 281  < > = values in this interval not displayed. Cardiac Enzymes:  Recent Labs Lab 02/14/15 1533 02/14/15 2148  TROPONINI 0.03 0.03   CBG:  Recent Labs Lab  02/17/15 2031 02/18/15 0727 02/18/15 1123 02/18/15 1648 02/18/15 2010 02/19/15 0724  GLUCAP 273* 126* 179* 179* 282* 228*   Anemia Panel:  Recent Labs Lab 02/15/15 0344  VITAMINB12 1133*  FOLATE 16.1  FERRITIN 67  TIBC 329  IRON 14*  RETICCTPCT 1.2   Urinalysis:  Recent Labs Lab 02/14/15 1133  COLORURINE YELLOW  LABSPEC 1.015  PHURINE 6.0  GLUCOSEU NEGATIVE  HGBUR NEGATIVE  BILIRUBINUR NEGATIVE  KETONESUR NEGATIVE  PROTEINUR NEGATIVE  NITRITE NEGATIVE  LEUKOCYTESUR NEGATIVE   Micro Results: Recent Results (from the past 240 hour(s))  Culture, blood (routine x 2)     Status: None (Preliminary result)   Collection Time: 02/14/15  9:57 AM  Result Value Ref Range Status   Specimen Description BLOOD RIGHT ANTECUBITAL  Final   Special Requests BOTTLES DRAWN AEROBIC AND ANAEROBIC 5CC  Final   Culture NO GROWTH 4 DAYS  Final   Report Status PENDING  Incomplete  Culture, blood (routine x 2)     Status: None (Preliminary result)   Collection  Time: 02/14/15 10:01 AM  Result Value Ref Range Status   Specimen Description BLOOD LEFT ANTECUBITAL  Final   Special Requests BOTTLES DRAWN AEROBIC AND ANAEROBIC 10CC  Final   Culture NO GROWTH 4 DAYS  Final   Report Status PENDING  Incomplete  Urine culture     Status: None   Collection Time: 02/14/15 11:33 AM  Result Value Ref Range Status   Specimen Description URINE, CLEAN CATCH  Final   Special Requests NONE  Final   Culture MULTIPLE SPECIES PRESENT, SUGGEST RECOLLECTION  Final   Report Status 02/15/2015 FINAL  Final   Studies/Results: Dg Chest 2 View  02/18/2015  CLINICAL DATA:  Shortness of breath, COPD, heart failure. EXAM: CHEST  2 VIEW COMPARISON:  Chest x-ray and chest CT dated 02/14/2015. FINDINGS: Cardiomediastinal silhouette is stable in size and configuration. Surgical changes of the mediastinum again noted. Small hiatal hernia noted. The patchy airspace opacities within each lung are stable in extent. No new  lung findings seen. Again noted are small pleural effusions, better seen on previous CT. Lungs are hyperexpanded consistent with the COPD/emphysema also identified on the recent chest CT. The compression fracture deformity within the mid thoracic spine is stable with associated thoracic spine kyphosis. No acute- appearing osseous abnormality identified. IMPRESSION: Stable chest x-ray. The bilateral airspace opacities are not significantly changed compared to the chest x-ray of 02/14/2015, compatible with multifocal pneumonias as described on the chest CT also performed on 02/14/2015. Electronically Signed   By: Franki Cabot M.D.   On: 02/18/2015 09:49   Medications: I have reviewed the patient's current medications. Scheduled Meds: . amiodarone  200 mg Oral Daily  . antiseptic oral rinse  7 mL Mouth Rinse q12n4p  . atorvastatin  40 mg Oral Daily  . azithromycin  250 mg Oral Daily  . budesonide-formoterol  2 puff Inhalation BID  . busPIRone  7.5 mg Oral BID  . calcium-vitamin D  1 tablet Oral Q breakfast  . cefTRIAXone (ROCEPHIN)  IV  1 g Intravenous Q24H  . chlorhexidine  15 mL Mouth Rinse BID  . dextromethorphan-guaiFENesin  1 tablet Oral BID  . diltiazem  180 mg Oral Daily  . enoxaparin (LOVENOX) injection  40 mg Subcutaneous Q24H  . feeding supplement (ENSURE ENLIVE)  237 mL Oral Q1500  . ferrous sulfate  325 mg Oral QHS  . furosemide  40 mg Oral Daily  . Influenza vac split quadrivalent PF  0.5 mL Intramuscular Tomorrow-1000  . insulin aspart  0-9 Units Subcutaneous TID WC  . insulin detemir  5 Units Subcutaneous Daily  . ipratropium-albuterol  3 mL Nebulization Q4H  . loratadine  10 mg Oral Daily  . pantoprazole  40 mg Oral BID  . predniSONE  60 mg Oral Q breakfast  . tiotropium  1 capsule Inhalation Daily  . cyanocobalamin  2,000 mcg Oral Daily   Continuous Infusions:  PRN Meds:.acetaminophen, albuterol Assessment/Plan: Principal Problem:   CAP (community acquired  pneumonia) Active Problems:   Hyperlipidemia   ANEMIA, B12 DEFICIENCY   Essential hypertension   Hx of CABG   Osteoporosis   Diastolic CHF ([place])   Diabetes mellitus type II, controlled (HCC)   COPD (chronic obstructive pulmonary disease) (HCC)   Chronic atrial fibrillation (HCC)   Compression fracture of L1 lumbar vertebra (HCC)   CAD (coronary artery disease)   Iron deficiency anemia   HCAP (healthcare-associated pneumonia)  CAP Patient has a history of bullous emphysema and presented with multifocal bilateral pneumonia.  He had mild leukocytosis (WBC 12.5) on admission. Leukocytosis has now resolved. Lungs CTAB on exam. Patient is satting 93% on 2L O2 via [place]. He is on 2L O2 at home. He was treated with 2 doses of IV solumedrol yesterday. He is stable today and at his baseline. He will likely be discharged to SNF.  -D/c Ceftriaxone  -D/c Azithromycin -D/c Solumedrol and switch to PO prednisone 40 mg x 7 days -Mucinex DM  -Albuterol nebulizer -Budesonide-formoterol inhaler -Spiriva   COPD Patient has a 50 pack year smoking history, quit 3-4 years ago. CT of lungs showing chronic lung scarring and moderate-advanced emphysema.  -Albuterol nebulizer -Budesonide-formoterol inhaler -Spiriva   Pulmonary nodule  CT of chest showing a 8 mm nodule in the L upper lobe (new from prior study). Nodule suspicious for possible malignancy because patient has a 50 pack year smoking history. Discussed CT findings with the patient today and he expressed his understanding.  -Will suggest PCP to pursue further workup such as PET scan and biopsy of the nodule as outpatient.  History of falls and fractures Burst fracture of T1 and compression fracture of T7 seen on imaging. However, no pain on palpation and normal ROM on physical exam. Patient is not complain of any back or shoulder pain at present.   Afib EKG on admission showing atrial flutter and QT prolongation (QTc 471).  -Amiodarone 200  mg daily -Cardizem 180 mg daily   CAD/ CHF -cardizem 180 mg daily  -Hold Lasix 40 mg daily.  Hypokalemia -Resolved. K normal today.  -Daily BMP, replete K as needed   Normocytic anemia Patient's Hgb in the 13s (up until 07/2014). Hgb 10.1 on admission. He denies having any unintentional weight loss, hematemesis, hematochezia, or melena. Last colonoscopy from 2013 showing hyperplastic polyps, mild diverticulosis, and internal hemorrhoids. Anemia panel showing low iron, low saturation, ferritin on the lower limit of normal. TIBC normal. Likely due to anemia of acute illness. Will request PCP to recheck CBC at f/u visit.  HLD -Lipitor 40 mg daily  DM -Levemir 5 u daily  -SSI -CBG monitoring   GERD -Protonix 40 mg BID  Osteoporosis  -Fosamax 70 mg qweekly -Caltrate 600 +D plus  Anxiety -Buspar 7.5 mg BID  Allergies -Claritin 10 mg daily  B12 deficiency -B12 2000 mcg daily  DVT ppx -Lovenox   Diet -cardiac -ensure   Code: DNR  Dispo: Disposition is deferred at this time, awaiting improvement of current medical problems.  Anticipated discharge in approximately 0-1 day(s).   The patient does have a current PCP Marin Olp, MD) and does need an [facility] hospital follow-up appointment after discharge.  The patient does not have transportation limitations that hinder transportation to clinic appointments.  .Services Needed at time of discharge: Y = Yes, Blank = No PT:   OT:   RN:   Equipment:   Other:     LOS: 5 days   Shela Leff, MD 02/19/2015, 9:42 AM

## 2015-02-19 NOTE — Clinical Social Work Placement (Signed)
   CLINICAL SOCIAL WORK PLACEMENT  NOTE  Date:  02/19/2015  Patient Details  Name: [name] MRN: [id number] Date of Birth: 1941/02/25  Clinical Social Work is seeking post-discharge placement for this patient at the [place] level of care (*CSW will initial, date and re-position this form in  chart as items are completed):  No   Patient/family provided with [place] Work Department's list of facilities offering this level of care within the geographic area requested by the patient (or if unable, by the patient's family).  Yes   Patient/family informed of their freedom to choose among providers that offer the needed level of care, that participate in Medicare, Medicaid or managed care program needed by the patient, have an available bed and are willing to accept the patient.  No   Patient/family informed of [facility] ownership interest in [facility] and [facility], as well as of the fact that they are under no obligation to receive care at these facilities.  PASRR submitted to EDS on       PASRR number received on       Existing PASRR number confirmed on       FL2 transmitted to all facilities in geographic area requested by pt/family on 02/16/15     FL2 transmitted to all facilities within larger geographic area on 02/16/15     Patient informed that his/her managed care company has contracts with or will negotiate with certain facilities, including the following:        Yes   Patient/family informed of bed offers received.  Patient chooses bed at [facility]     Physician recommends and patient chooses bed at      Patient to be transferred to [facility] on 02/19/15.  Patient to be transferred to facility by Ambulance     Patient family notified on 02/19/15 of transfer.  Name of family member notified:  Genia Harold     PHYSICIAN       Additional Comment:  Per MD patient ready for DC to [facility].  RN, patient, patient's family, and facility notified of DC. RN given number for report. DC packet on chart. Ambulance transport arranged by RN. CSW has assisted RN Jasmine with facilitation of DC. DC packet includes Facesheet with SSN, Medical Necessity Form, DNR form, and any needed signed prescriptions. Facility states that are prepared and expecting the patient. CSW signing off.   _______________________________________________ Liz Beach MSW, [place], [place], [id number]

## 2015-02-20 DIAGNOSIS — R911 Solitary pulmonary nodule: Secondary | ICD-10-CM | POA: Diagnosis not present

## 2015-02-20 DIAGNOSIS — Z9981 Dependence on supplemental oxygen: Secondary | ICD-10-CM | POA: Diagnosis not present

## 2015-02-20 DIAGNOSIS — I5030 Unspecified diastolic (congestive) heart failure: Secondary | ICD-10-CM | POA: Diagnosis not present

## 2015-02-20 DIAGNOSIS — J449 Chronic obstructive pulmonary disease, unspecified: Secondary | ICD-10-CM | POA: Diagnosis not present

## 2015-02-22 DIAGNOSIS — R911 Solitary pulmonary nodule: Secondary | ICD-10-CM | POA: Diagnosis present

## 2015-02-22 NOTE — Progress Notes (Signed)
Patient ID: [name], male   DOB: 1940-08-04, 74 y.o.   MRN: [id number] Medicine attending discharge note: I personally examined this patient on the day prior to discharge. I attest to the accuracy of the discharge evaluation and plan as recorded in the 02/19/2015 progress note by resident physician Dr. Shela Leff.  Clinical summary: 74 year old man with oxygen-dependent obstructive airway disease admitted on November 19 with a one-week history of worsening dyspnea, productive cough, wheezing, subjective chills, nausea, and left-sided back and shoulder pain. Chest x-ray showed changes from previous coronary bypass procedure/median sternotomy. Bilateral pleural effusions right greater than left. Interstitial edema. Bilateral patchy airspace opacities. CT scan done for further characterization confirmed multifocal bilateral pulmonary infiltrates and effusions. He has severe, bullous emphysema. EKG shows atrial flutter with a 4:1 block. Prolonged QT interval. No acute ischemic changes. BNP 403 compare with 309 in February.  Hospital course: Treatment initiated for a community acquired pneumonia. We add a short course of steroids in addition to his bronchodilators due to persistent wheezing despite maximum bronchodilators and inhaled steroids. Steroids discontinued after the first dose due to transient confusion but later resumed when further history from his son reported intermittent confusion over the last year likely related to his lung disease with associated hypoxia. Outpatient antiarrhythmics with Cardizem and amiodarone were continued to control his chronic A. fib/flutter. He slowly improved back to his baseline but of note his baseline at this point in his disease process is one of little reserve with dyspnea on minimal exertion including just moving around in the bed. A follow-up chest radiograph showed no progression of infiltrates. He remained afebrile. Oxygen was tapered back to his home  dose of 2 L nasal cannula. Incidentally noted on admission CT scan of the chest was an 8 mm left upper lobe nodule new compare with a 07/26/2014 which will need to be followed up in 6-8 weeks and if patient stable enough at that time we will need to consider a biopsy.  Disposition: Condition stable at time of discharge Follow-up with his primary care physician Dr. Garret Reddish at the [facility] There were no complications.

## 2015-02-24 ENCOUNTER — Telehealth: Payer: Self-pay | Admitting: Internal Medicine

## 2015-02-24 NOTE — Telephone Encounter (Signed)
Spoke to patient's PCP Dr. Reubin Milan at the [facility] medical center in [place], [place] on 02/24/15 at 2:01 pm.  I informed her of the CT finding of a 8 mm spiculated pulmonary nodule in the left upper lobe and requested her to pursue further workup (PET scan and biopsy) as outpatient. In addition, patient's discharge summary has been faxed to her at [phone].

## 2015-02-26 DIAGNOSIS — R2681 Unsteadiness on feet: Secondary | ICD-10-CM | POA: Diagnosis not present

## 2015-02-26 DIAGNOSIS — M6281 Muscle weakness (generalized): Secondary | ICD-10-CM | POA: Diagnosis not present

## 2015-02-26 DIAGNOSIS — I5030 Unspecified diastolic (congestive) heart failure: Secondary | ICD-10-CM | POA: Diagnosis not present

## 2015-02-26 DIAGNOSIS — Z951 Presence of aortocoronary bypass graft: Secondary | ICD-10-CM | POA: Diagnosis not present

## 2015-02-26 DIAGNOSIS — J449 Chronic obstructive pulmonary disease, unspecified: Secondary | ICD-10-CM | POA: Diagnosis not present

## 2015-02-26 DIAGNOSIS — K219 Gastro-esophageal reflux disease without esophagitis: Secondary | ICD-10-CM | POA: Diagnosis not present

## 2015-02-26 DIAGNOSIS — M810 Age-related osteoporosis without current pathological fracture: Secondary | ICD-10-CM | POA: Diagnosis not present

## 2015-02-26 DIAGNOSIS — J189 Pneumonia, unspecified organism: Secondary | ICD-10-CM | POA: Diagnosis not present

## 2015-02-26 DIAGNOSIS — Z9181 History of falling: Secondary | ICD-10-CM | POA: Diagnosis not present

## 2015-02-26 DIAGNOSIS — I482 Chronic atrial fibrillation: Secondary | ICD-10-CM | POA: Diagnosis not present

## 2015-02-26 DIAGNOSIS — M7020 Olecranon bursitis, unspecified elbow: Secondary | ICD-10-CM | POA: Diagnosis not present

## 2015-02-26 DIAGNOSIS — Z86010 Personal history of colonic polyps: Secondary | ICD-10-CM | POA: Diagnosis not present

## 2015-02-26 DIAGNOSIS — Z9981 Dependence on supplemental oxygen: Secondary | ICD-10-CM | POA: Diagnosis not present

## 2015-02-26 DIAGNOSIS — E119 Type 2 diabetes mellitus without complications: Secondary | ICD-10-CM | POA: Diagnosis not present

## 2015-02-26 DIAGNOSIS — R29898 Other symptoms and signs involving the musculoskeletal system: Secondary | ICD-10-CM | POA: Diagnosis not present

## 2015-03-02 DIAGNOSIS — I5032 Chronic diastolic (congestive) heart failure: Secondary | ICD-10-CM | POA: Diagnosis not present

## 2015-03-02 DIAGNOSIS — E1151 Type 2 diabetes mellitus with diabetic peripheral angiopathy without gangrene: Secondary | ICD-10-CM | POA: Diagnosis not present

## 2015-03-02 DIAGNOSIS — I2510 Atherosclerotic heart disease of native coronary artery without angina pectoris: Secondary | ICD-10-CM | POA: Diagnosis not present

## 2015-03-02 DIAGNOSIS — I4891 Unspecified atrial fibrillation: Secondary | ICD-10-CM | POA: Diagnosis not present

## 2015-03-02 DIAGNOSIS — I110 Hypertensive heart disease with heart failure: Secondary | ICD-10-CM | POA: Diagnosis not present

## 2015-03-02 DIAGNOSIS — J449 Chronic obstructive pulmonary disease, unspecified: Secondary | ICD-10-CM | POA: Diagnosis not present

## 2015-03-03 DIAGNOSIS — I4891 Unspecified atrial fibrillation: Secondary | ICD-10-CM | POA: Diagnosis not present

## 2015-03-03 DIAGNOSIS — I2510 Atherosclerotic heart disease of native coronary artery without angina pectoris: Secondary | ICD-10-CM | POA: Diagnosis not present

## 2015-03-03 DIAGNOSIS — J449 Chronic obstructive pulmonary disease, unspecified: Secondary | ICD-10-CM | POA: Diagnosis not present

## 2015-03-03 DIAGNOSIS — I5032 Chronic diastolic (congestive) heart failure: Secondary | ICD-10-CM | POA: Diagnosis not present

## 2015-03-03 DIAGNOSIS — E1151 Type 2 diabetes mellitus with diabetic peripheral angiopathy without gangrene: Secondary | ICD-10-CM | POA: Diagnosis not present

## 2015-03-03 DIAGNOSIS — I110 Hypertensive heart disease with heart failure: Secondary | ICD-10-CM | POA: Diagnosis not present

## 2015-03-04 DIAGNOSIS — I5032 Chronic diastolic (congestive) heart failure: Secondary | ICD-10-CM | POA: Diagnosis not present

## 2015-03-04 DIAGNOSIS — I110 Hypertensive heart disease with heart failure: Secondary | ICD-10-CM | POA: Diagnosis not present

## 2015-03-04 DIAGNOSIS — J449 Chronic obstructive pulmonary disease, unspecified: Secondary | ICD-10-CM | POA: Diagnosis not present

## 2015-03-04 DIAGNOSIS — E1151 Type 2 diabetes mellitus with diabetic peripheral angiopathy without gangrene: Secondary | ICD-10-CM | POA: Diagnosis not present

## 2015-03-04 DIAGNOSIS — I2510 Atherosclerotic heart disease of native coronary artery without angina pectoris: Secondary | ICD-10-CM | POA: Diagnosis not present

## 2015-03-04 DIAGNOSIS — I4891 Unspecified atrial fibrillation: Secondary | ICD-10-CM | POA: Diagnosis not present

## 2015-03-06 DIAGNOSIS — E1151 Type 2 diabetes mellitus with diabetic peripheral angiopathy without gangrene: Secondary | ICD-10-CM | POA: Diagnosis not present

## 2015-03-06 DIAGNOSIS — I5032 Chronic diastolic (congestive) heart failure: Secondary | ICD-10-CM | POA: Diagnosis not present

## 2015-03-06 DIAGNOSIS — J449 Chronic obstructive pulmonary disease, unspecified: Secondary | ICD-10-CM | POA: Diagnosis not present

## 2015-03-06 DIAGNOSIS — I110 Hypertensive heart disease with heart failure: Secondary | ICD-10-CM | POA: Diagnosis not present

## 2015-03-06 DIAGNOSIS — I4891 Unspecified atrial fibrillation: Secondary | ICD-10-CM | POA: Diagnosis not present

## 2015-03-06 DIAGNOSIS — I2510 Atherosclerotic heart disease of native coronary artery without angina pectoris: Secondary | ICD-10-CM | POA: Diagnosis not present

## 2015-03-09 DIAGNOSIS — I4891 Unspecified atrial fibrillation: Secondary | ICD-10-CM | POA: Diagnosis not present

## 2015-03-09 DIAGNOSIS — I110 Hypertensive heart disease with heart failure: Secondary | ICD-10-CM | POA: Diagnosis not present

## 2015-03-09 DIAGNOSIS — I2510 Atherosclerotic heart disease of native coronary artery without angina pectoris: Secondary | ICD-10-CM | POA: Diagnosis not present

## 2015-03-09 DIAGNOSIS — E1151 Type 2 diabetes mellitus with diabetic peripheral angiopathy without gangrene: Secondary | ICD-10-CM | POA: Diagnosis not present

## 2015-03-09 DIAGNOSIS — J449 Chronic obstructive pulmonary disease, unspecified: Secondary | ICD-10-CM | POA: Diagnosis not present

## 2015-03-09 DIAGNOSIS — I5032 Chronic diastolic (congestive) heart failure: Secondary | ICD-10-CM | POA: Diagnosis not present

## 2015-03-10 DIAGNOSIS — I2510 Atherosclerotic heart disease of native coronary artery without angina pectoris: Secondary | ICD-10-CM | POA: Diagnosis not present

## 2015-03-10 DIAGNOSIS — I4891 Unspecified atrial fibrillation: Secondary | ICD-10-CM | POA: Diagnosis not present

## 2015-03-10 DIAGNOSIS — I110 Hypertensive heart disease with heart failure: Secondary | ICD-10-CM | POA: Diagnosis not present

## 2015-03-10 DIAGNOSIS — J449 Chronic obstructive pulmonary disease, unspecified: Secondary | ICD-10-CM | POA: Diagnosis not present

## 2015-03-10 DIAGNOSIS — E1151 Type 2 diabetes mellitus with diabetic peripheral angiopathy without gangrene: Secondary | ICD-10-CM | POA: Diagnosis not present

## 2015-03-10 DIAGNOSIS — I5032 Chronic diastolic (congestive) heart failure: Secondary | ICD-10-CM | POA: Diagnosis not present

## 2015-03-12 DIAGNOSIS — E1151 Type 2 diabetes mellitus with diabetic peripheral angiopathy without gangrene: Secondary | ICD-10-CM | POA: Diagnosis not present

## 2015-03-12 DIAGNOSIS — I2510 Atherosclerotic heart disease of native coronary artery without angina pectoris: Secondary | ICD-10-CM | POA: Diagnosis not present

## 2015-03-12 DIAGNOSIS — J449 Chronic obstructive pulmonary disease, unspecified: Secondary | ICD-10-CM | POA: Diagnosis not present

## 2015-03-12 DIAGNOSIS — I4891 Unspecified atrial fibrillation: Secondary | ICD-10-CM | POA: Diagnosis not present

## 2015-03-12 DIAGNOSIS — I110 Hypertensive heart disease with heart failure: Secondary | ICD-10-CM | POA: Diagnosis not present

## 2015-03-12 DIAGNOSIS — I5032 Chronic diastolic (congestive) heart failure: Secondary | ICD-10-CM | POA: Diagnosis not present

## 2015-03-13 DIAGNOSIS — I4891 Unspecified atrial fibrillation: Secondary | ICD-10-CM | POA: Diagnosis not present

## 2015-03-13 DIAGNOSIS — E1151 Type 2 diabetes mellitus with diabetic peripheral angiopathy without gangrene: Secondary | ICD-10-CM | POA: Diagnosis not present

## 2015-03-13 DIAGNOSIS — I5032 Chronic diastolic (congestive) heart failure: Secondary | ICD-10-CM | POA: Diagnosis not present

## 2015-03-13 DIAGNOSIS — I110 Hypertensive heart disease with heart failure: Secondary | ICD-10-CM | POA: Diagnosis not present

## 2015-03-13 DIAGNOSIS — I2510 Atherosclerotic heart disease of native coronary artery without angina pectoris: Secondary | ICD-10-CM | POA: Diagnosis not present

## 2015-03-13 DIAGNOSIS — J449 Chronic obstructive pulmonary disease, unspecified: Secondary | ICD-10-CM | POA: Diagnosis not present

## 2015-03-30 ENCOUNTER — Encounter: Payer: Self-pay | Admitting: Physician Assistant

## 2015-03-30 NOTE — Progress Notes (Signed)
Cardiology Office Note Date:  03/31/2015  Patient ID:  [name], [name] [birth year], MRN [id number] PCP:  Reubin Milan, MD  Cardiologist:  Burt Knack  Chief Complaint: f/u CAD, CHF, atrial arrhythmias  History of Present Illness: [name] is a 75 y.o. male with history of CAD s/p CABG [id number], chronic diastolic CHF, paroxysmal atrial fib/flutter, PVD s/p prior iliac stenting, severe COPD with chronic respiratory failure on home oxygen, alcohol abuse, pulm nodule, normocytic anemia, rectal bleeding who presents for f/u.   Per review of chart, he has h/o acute on chronic respiratory failure thought to be exacerbated by atrial fibrillation with RVR. He underwent cardioversion in 03/2014 but reverted back to atrial fibrillation. He was then started on amiodarone. It was difficult to achieve a therapeutic INR and DCCV was deferred. The patient underwent elbow surgery in April and there was hematoma noted (question of whether Coumadin caused this problem) so Coumadin was not restarted.He was seen by Dr. Burt Knack in 07/2014 at which time he was prescribed Pradaxa with plan to consider DCCV at f/u. He was then admitted 5/28-6/3 with syncope which occurred after sneezing and was felt to be vasovagal. Telemetry was negative. Troponins remained normal. He was also treated for AECOPD which may have exacerbated his syncope. CT was negative for pulmonary embolism. The patient had been holding his Pradaxa secondary to left elbow hematoma. He was asked to resume this upon discharge. He then saw Richardson Dopp PA-C in 08/2014 at which time he had reported another episode of near-syncope with fall and lumbar compression fracture. He was also reporting several episodes of rectal bleeding - Pradaxa had been stopped by primary care. He was found to be in atrial flutter. At that visit it was felt that his bleeding risk appeared too great to be continued on anticoagulation. It was recommended that he continue amiodarone for rate  control despite his severe lung problems as rate control without it was extremely poor. He was last admitted 01/2015 with PNA and found to have a pulm nodule with recomendation to follow this up as outpatient with PCP.  He comes in today for f/u. Overall he is stable from a cardiac perspective - no recent angina. He has chronic DOE which is at baseline for him due to severe COPD. No LEE. No syncope reported. Denies any further EtOH or tobacco abuse. No awareness of arrhythmia. Has f/u with pulm in February 2017.    Prior Studies Holter 48 hours 03/05/14 NSR, A. Fib/flutter, short runs of NSVT  Echo 02/21/14 - Mild concentric hypertrophy. EF 55% to 60%. Wallmotion was normal - Mitral valve: Calcified annulus. Mildly thickened leaflets . - Left atrium: The atrium was mildly dilated.  Myoview 01/2010 Overall Impression Comments: There is moderate scar in the inferior wall. There is no ischemia  LHC 07/2009 LM: Distal 40% LAD: Proximal 70-80%, mid 70% LCx: RI 80%, OM1 and OM to 70% RCA: Mid 50%, distal 70% EF 60% with inferior HK   Past Medical History  Diagnosis Date  . HYPERLIPIDEMIA 11/21/2006  . Essential hypertension   . MYOCARDIAL INFARCTION, HX OF 11/21/2006  . OSTEOPOROSIS 11/21/2006  . COLONIC POLYPS 05/21/2007  . HIATAL HERNIA 05/21/2007  . DIVERTICULAR DISEASE 05/21/2007  . NEPHROLITHIASIS 05/21/2007  . GI BLEEDING 09/09/2008  . ANEMIA, B12 DEFICIENCY 12/02/2008  . CAROTID ARTERY STENOSIS 04/16/2009  . PVD (peripheral vascular disease) ([place])     a. s/p prior iliac stenting.  . Allergic rhinitis   . Osteoporosis   .  Hemoptysis   . Internal hemorrhoids   . GERD (gastroesophageal reflux disease)   . Cataract   . Pneumonia   . Arthritis   . History of kidney stones   . CAD (coronary artery disease)     a.  s/p CABG 2011.  Marland Kitchen Chronic diastolic CHF (congestive heart failure) ([place])   . Paroxysmal atrial fibrillation ([place])     a. Anticoag stopped due to falls, rectal  bleeding.  . Paroxysmal atrial flutter ([place])   . COPD, severe ([place])   . Chronic respiratory failure ([place])   . Alcohol abuse   . Pulmonary nodule     a. Dx 01/2015 -> advised to f/u PCP for PET.  . Normocytic anemia   . Falls   . NSVT (nonsustained ventricular tachycardia) ([place])     a. Holter 2015: NSR, A. Fib/flutter, short runs of NSVT    Past Surgical History  Procedure Laterality Date  . Thyroidectomy      partial  . Coronary stent placement      03/07/2001  . Cholecystectomy    . Appendectomy    . Hemorrhoid surgery    . Inguinal hernia repair  10/10/08  . Coronary artery bypass graft  2011  . Ptca    . Cardioversion N/A 04/16/2014    Procedure: CARDIOVERSION;  Surgeon: Josue Hector, MD;  Location: [facility];  Service: Cardiovascular;  Laterality: N/A;  . Olecranon bursectomy  07/09/2014  . Mass excision Left 07/09/2014    Procedure: LEFT ELBOW BURSECTOMY EXCISION MASS;  Surgeon: Iran Planas, MD;  Location: [facility];  Service: Orthopedics;  Laterality: Left;    Current Outpatient Prescriptions  Medication Sig Dispense Refill  . acetaminophen (TYLENOL) 500 MG tablet Take 500 mg by mouth every 6 (six) hours as needed for mild pain or moderate pain.    Marland Kitchen albuterol (PROVENTIL) (2.5 MG/3ML) 0.083% nebulizer solution Take 2.5 mg by nebulization every 6 (six) hours as needed for wheezing or shortness of breath.    Marland Kitchen alendronate (FOSAMAX) 70 MG tablet Take 70 mg by mouth once a week. Take with a full glass of water on an empty stomach.    Marland Kitchen amiodarone (PACERONE) 200 MG tablet TAKE 1 TABLET (200 MG TOTAL) BY MOUTH DAILY. 30 tablet 5  . atorvastatin (LIPITOR) 40 MG tablet Take 40 mg by mouth daily.    . budesonide-formoterol (SYMBICORT) 160-4.5 MCG/ACT inhaler Inhale 2 puffs into the lungs 2 (two) times daily.    . busPIRone (BUSPAR) 7.5 MG tablet Take 7.5 mg by mouth 2 (two) times daily.    . Calcium Carbonate-Vit D-Min (CALTRATE 600+D PLUS) 600-400 MG-UNIT per tablet Chew 1 tablet  by mouth daily.      . cetirizine (ZYRTEC ALLERGY) 10 MG tablet Take 1 tablet (10 mg total) by mouth every morning.    . cyanocobalamin ([employer] VITAMIN B12) 2000 MCG tablet Take 1 tablet (2,000 mcg total) by mouth daily.    Marland Kitchen dextromethorphan-guaiFENesin (MUCINEX DM) 30-600 MG 12hr tablet Take 1 tablet by mouth 2 (two) times daily. 20 tablet 0  . diltiazem (CARDIZEM CD) 180 MG 24 hr capsule Take 1 capsule (180 mg total) by mouth daily. 90 capsule 3  . furosemide (LASIX) 40 MG tablet TAKE 1 TABLET BY MOUTH EVERY DAY 30 tablet 9  . insulin detemir (LEVEMIR) 100 UNIT/ML injection Inject 0.05 mLs (5 Units total) into the skin daily. 10 mL 11  . insulin lispro (HUMALOG) 100 UNIT/ML injection Inject 5 Units into the skin  3 (three) times daily before meals.     . OXYGEN Inhale 2 L into the lungs continuous.    . pantoprazole (PROTONIX) 40 MG tablet TAKE 1 TABLET BY MOUTH TWICE A DAY 180 tablet 1  . potassium chloride SA (K-DUR,KLOR-CON) 20 MEQ tablet Take 1 tablet (20 mEq total) by mouth daily. 30 tablet 3  . SPIRIVA HANDIHALER 18 MCG inhalation capsule INHALE CONTENTS OF 1 CAPSULE DAILY 30 capsule 0  . predniSONE (DELTASONE) 20 MG tablet Take 2 tablets (40 mg total) by mouth daily with breakfast. (Patient not taking: Reported on 03/31/2015) 14 tablet 0   No current facility-administered medications for this visit.    Allergies:   Xarelto; Ticlopidine hcl; and Lorazepam   Social History:  The patient  reports that he quit smoking about 5 years ago. His smoking use included Cigarettes. He has a 150 pack-year smoking history. He has never used smokeless tobacco. He reports that he drinks about 3.0 oz of alcohol per week. He reports that he does not use illicit drugs.   Family History:  The patient's family history includes Arthritis in his mother; Colon cancer (age of onset: 65) in his brother; Heart attack in his father; Heart disease in his brother, brother, and father. There is no history of Stomach  cancer.  ROS:  Please see the history of present illness.   All other systems are reviewed and otherwise negative.   PHYSICAL EXAM:  VS:  BP 108/70 mmHg  Pulse 96  Ht 5\' 3"  (1.6 m)  Wt 150 lb 12.8 oz (68.402 kg)  BMI 26.72 kg/m2  SpO2 94% BMI: Body mass index is 26.72 kg/(m^2). Well developed chronically ill WM, appears older than stated age, in no acute distress HEENT: normocephalic, atraumatic Neck: no JVD, carotid bruits or masses Cardiac:  Irregularly irregular, rate controlled, no murmurs, rubs, or gallops Lungs:  Diminished bilaterally without wheezing, rhonchi or rales- on [place] Abd: soft, nontender, no hepatomegaly, + BS MS: no deformity or atrophy Ext: no edema Skin: warm and dry, no rash Neuro:  moves all extremities spontaneously, no focal abnormalities noted, follows commands Psych: euthymic mood, full affect   EKG:  Done today shows atrial flutter 103bpm, prior anterolateral infract, no acute ST-T changes  Recent Labs: 05/16/2014: Magnesium 2.2 08/23/2014: TSH 1.848 02/14/2015: B Natriuretic Peptide 403.1* 02/15/2015: ALT 27 02/19/2015: BUN 24*; Creatinine, Ser 1.00; Hemoglobin 9.8*; Platelets 281; Potassium 4.4; Sodium 138  No results found for requested labs within last 365 days.   CrCl cannot be calculated (Patient has no serum creatinine result on file.).   Wt Readings from Last 3 Encounters:  03/31/15 150 lb 12.8 oz (68.402 kg)  02/18/15 141 lb 8.6 oz (64.2 kg)  09/18/14 132 lb (59.875 kg)     Other studies reviewed: Additional studies/records reviewed today include: summarized above  ASSESSMENT AND PLAN:  1. Paroxysmal atrial fib/flutter - remains in persistent atrial flutter. Continue rate control strategy for now. He has history of poor rate control off amiodarone thus will continue for now per prior recommendation. BP is on the lower side so cannot aggressively titrate his diltiazem. HR comes down to the 80s-90s at rest. Suspect COPD/deconditioning  contributes to higher rates after activity. I have asked him to track HR at home and call if running >100. AST/ALT were OK in 01/2015 on amiodarone. Consider repeat TSH at f/u appt. Lung function is being monitored by pulmonary. Not on anticoagulation as above. 2. CAD - no recurrent angina. He is  not currently on any anticoagulation or antiplatelets. Has h/o anemia in 01/2015. He follows at the [facility] for [facility] care. I have asked him and his son to touch base with primary care doctor at the [facility] to determine if he would be a candidate to resume baby aspirin for his coronary disease at some point. Not on BB presumably due to severe COPD. Continue statin. 3. Chronic diastolic CHF - appears euvolemic. Continue current regimen. As above, he has history of poor rate control off amiodarone which led to worsened CHF, so amiodarone is being continued for now. 4. Severe COPD with chronic resp failure and pulm nodule - follow up with pulm as scheduled.  Disposition: F/u with Dr. Burt Knack in 4-6 months. I also recommended he f/u with PCP for a periodic tremor he mentioned (when he gets nervous).  Current medicines are reviewed at length with the patient today.  The patient did not have any concerns regarding medicines.  Raechel Ache PA-C 03/31/2015 12:16 PM     [address] [place] [address] [phone] (office)  [phone] (fax)

## 2015-03-31 ENCOUNTER — Encounter: Payer: Self-pay | Admitting: Physician Assistant

## 2015-03-31 ENCOUNTER — Ambulatory Visit (INDEPENDENT_AMBULATORY_CARE_PROVIDER_SITE_OTHER): Payer: Medicare Other | Admitting: Physician Assistant

## 2015-03-31 VITALS — BP 108/70 | HR 96 | Ht 63.0 in | Wt 150.8 lb

## 2015-03-31 DIAGNOSIS — I5032 Chronic diastolic (congestive) heart failure: Secondary | ICD-10-CM

## 2015-03-31 DIAGNOSIS — I4892 Unspecified atrial flutter: Secondary | ICD-10-CM | POA: Diagnosis not present

## 2015-03-31 DIAGNOSIS — J9610 Chronic respiratory failure, unspecified whether with hypoxia or hypercapnia: Secondary | ICD-10-CM

## 2015-03-31 DIAGNOSIS — I480 Paroxysmal atrial fibrillation: Secondary | ICD-10-CM

## 2015-03-31 DIAGNOSIS — I2510 Atherosclerotic heart disease of native coronary artery without angina pectoris: Secondary | ICD-10-CM | POA: Diagnosis not present

## 2015-03-31 DIAGNOSIS — J449 Chronic obstructive pulmonary disease, unspecified: Secondary | ICD-10-CM

## 2015-03-31 NOTE — Patient Instructions (Signed)
Medication Instructions:  Your physician recommends that you continue on your current medications as directed. Please refer to the Current Medication list given to you today.  Labwork: NONE  Testing/Procedures: NONE  Follow-Up: Your physician wants you to follow-up in: 4 to 6 months with Dr. Emelda Fear will receive a reminder letter in the mail two months in advance. If you don't receive a letter, please call our office to schedule the follow-up appointment.  Call if you notice your heart rate tending to run over 100 bpm. [phone]  Your physician wants you to follow-up with your primary care doctor about taking baby aspirin.     If you need a refill on your cardiac medications before your next appointment, please call your pharmacy.

## 2015-05-05 DIAGNOSIS — Z9981 Dependence on supplemental oxygen: Secondary | ICD-10-CM | POA: Diagnosis not present

## 2015-05-05 DIAGNOSIS — J449 Chronic obstructive pulmonary disease, unspecified: Secondary | ICD-10-CM | POA: Diagnosis not present

## 2015-05-05 DIAGNOSIS — E119 Type 2 diabetes mellitus without complications: Secondary | ICD-10-CM | POA: Diagnosis not present

## 2015-05-05 DIAGNOSIS — Z8701 Personal history of pneumonia (recurrent): Secondary | ICD-10-CM | POA: Diagnosis not present

## 2015-05-05 DIAGNOSIS — Z794 Long term (current) use of insulin: Secondary | ICD-10-CM | POA: Diagnosis not present

## 2015-05-05 DIAGNOSIS — Z9181 History of falling: Secondary | ICD-10-CM | POA: Diagnosis not present

## 2015-05-08 DIAGNOSIS — Z9981 Dependence on supplemental oxygen: Secondary | ICD-10-CM | POA: Diagnosis not present

## 2015-05-08 DIAGNOSIS — J449 Chronic obstructive pulmonary disease, unspecified: Secondary | ICD-10-CM | POA: Diagnosis not present

## 2015-05-08 DIAGNOSIS — Z9181 History of falling: Secondary | ICD-10-CM | POA: Diagnosis not present

## 2015-05-08 DIAGNOSIS — E119 Type 2 diabetes mellitus without complications: Secondary | ICD-10-CM | POA: Diagnosis not present

## 2015-05-08 DIAGNOSIS — Z8701 Personal history of pneumonia (recurrent): Secondary | ICD-10-CM | POA: Diagnosis not present

## 2015-05-08 DIAGNOSIS — Z794 Long term (current) use of insulin: Secondary | ICD-10-CM | POA: Diagnosis not present

## 2015-05-11 DIAGNOSIS — Z9181 History of falling: Secondary | ICD-10-CM | POA: Diagnosis not present

## 2015-05-11 DIAGNOSIS — Z8701 Personal history of pneumonia (recurrent): Secondary | ICD-10-CM | POA: Diagnosis not present

## 2015-05-11 DIAGNOSIS — Z794 Long term (current) use of insulin: Secondary | ICD-10-CM | POA: Diagnosis not present

## 2015-05-11 DIAGNOSIS — Z9981 Dependence on supplemental oxygen: Secondary | ICD-10-CM | POA: Diagnosis not present

## 2015-05-11 DIAGNOSIS — J449 Chronic obstructive pulmonary disease, unspecified: Secondary | ICD-10-CM | POA: Diagnosis not present

## 2015-05-11 DIAGNOSIS — E119 Type 2 diabetes mellitus without complications: Secondary | ICD-10-CM | POA: Diagnosis not present

## 2015-05-13 DIAGNOSIS — Z9981 Dependence on supplemental oxygen: Secondary | ICD-10-CM | POA: Diagnosis not present

## 2015-05-13 DIAGNOSIS — E119 Type 2 diabetes mellitus without complications: Secondary | ICD-10-CM | POA: Diagnosis not present

## 2015-05-13 DIAGNOSIS — Z794 Long term (current) use of insulin: Secondary | ICD-10-CM | POA: Diagnosis not present

## 2015-05-13 DIAGNOSIS — Z8701 Personal history of pneumonia (recurrent): Secondary | ICD-10-CM | POA: Diagnosis not present

## 2015-05-13 DIAGNOSIS — Z9181 History of falling: Secondary | ICD-10-CM | POA: Diagnosis not present

## 2015-05-13 DIAGNOSIS — J449 Chronic obstructive pulmonary disease, unspecified: Secondary | ICD-10-CM | POA: Diagnosis not present

## 2015-05-18 DIAGNOSIS — Z8701 Personal history of pneumonia (recurrent): Secondary | ICD-10-CM | POA: Diagnosis not present

## 2015-05-18 DIAGNOSIS — Z9181 History of falling: Secondary | ICD-10-CM | POA: Diagnosis not present

## 2015-05-18 DIAGNOSIS — Z9981 Dependence on supplemental oxygen: Secondary | ICD-10-CM | POA: Diagnosis not present

## 2015-05-18 DIAGNOSIS — J449 Chronic obstructive pulmonary disease, unspecified: Secondary | ICD-10-CM | POA: Diagnosis not present

## 2015-05-18 DIAGNOSIS — E119 Type 2 diabetes mellitus without complications: Secondary | ICD-10-CM | POA: Diagnosis not present

## 2015-05-18 DIAGNOSIS — Z794 Long term (current) use of insulin: Secondary | ICD-10-CM | POA: Diagnosis not present

## 2015-05-20 DIAGNOSIS — E119 Type 2 diabetes mellitus without complications: Secondary | ICD-10-CM | POA: Diagnosis not present

## 2015-05-20 DIAGNOSIS — Z9981 Dependence on supplemental oxygen: Secondary | ICD-10-CM | POA: Diagnosis not present

## 2015-05-20 DIAGNOSIS — Z8701 Personal history of pneumonia (recurrent): Secondary | ICD-10-CM | POA: Diagnosis not present

## 2015-05-20 DIAGNOSIS — J449 Chronic obstructive pulmonary disease, unspecified: Secondary | ICD-10-CM | POA: Diagnosis not present

## 2015-05-20 DIAGNOSIS — Z794 Long term (current) use of insulin: Secondary | ICD-10-CM | POA: Diagnosis not present

## 2015-05-20 DIAGNOSIS — Z9181 History of falling: Secondary | ICD-10-CM | POA: Diagnosis not present

## 2015-05-25 DIAGNOSIS — Z9181 History of falling: Secondary | ICD-10-CM | POA: Diagnosis not present

## 2015-05-25 DIAGNOSIS — Z9981 Dependence on supplemental oxygen: Secondary | ICD-10-CM | POA: Diagnosis not present

## 2015-05-25 DIAGNOSIS — Z8701 Personal history of pneumonia (recurrent): Secondary | ICD-10-CM | POA: Diagnosis not present

## 2015-05-25 DIAGNOSIS — Z794 Long term (current) use of insulin: Secondary | ICD-10-CM | POA: Diagnosis not present

## 2015-05-25 DIAGNOSIS — J449 Chronic obstructive pulmonary disease, unspecified: Secondary | ICD-10-CM | POA: Diagnosis not present

## 2015-05-25 DIAGNOSIS — E119 Type 2 diabetes mellitus without complications: Secondary | ICD-10-CM | POA: Diagnosis not present

## 2015-05-29 DIAGNOSIS — Z794 Long term (current) use of insulin: Secondary | ICD-10-CM | POA: Diagnosis not present

## 2015-05-29 DIAGNOSIS — J449 Chronic obstructive pulmonary disease, unspecified: Secondary | ICD-10-CM | POA: Diagnosis not present

## 2015-05-29 DIAGNOSIS — E119 Type 2 diabetes mellitus without complications: Secondary | ICD-10-CM | POA: Diagnosis not present

## 2015-05-29 DIAGNOSIS — Z9981 Dependence on supplemental oxygen: Secondary | ICD-10-CM | POA: Diagnosis not present

## 2015-05-29 DIAGNOSIS — Z8701 Personal history of pneumonia (recurrent): Secondary | ICD-10-CM | POA: Diagnosis not present

## 2015-05-29 DIAGNOSIS — Z9181 History of falling: Secondary | ICD-10-CM | POA: Diagnosis not present

## 2015-06-01 DIAGNOSIS — Z9181 History of falling: Secondary | ICD-10-CM | POA: Diagnosis not present

## 2015-06-01 DIAGNOSIS — Z8701 Personal history of pneumonia (recurrent): Secondary | ICD-10-CM | POA: Diagnosis not present

## 2015-06-01 DIAGNOSIS — Z794 Long term (current) use of insulin: Secondary | ICD-10-CM | POA: Diagnosis not present

## 2015-06-01 DIAGNOSIS — Z9981 Dependence on supplemental oxygen: Secondary | ICD-10-CM | POA: Diagnosis not present

## 2015-06-01 DIAGNOSIS — J449 Chronic obstructive pulmonary disease, unspecified: Secondary | ICD-10-CM | POA: Diagnosis not present

## 2015-06-01 DIAGNOSIS — E119 Type 2 diabetes mellitus without complications: Secondary | ICD-10-CM | POA: Diagnosis not present

## 2015-06-03 DIAGNOSIS — Z9981 Dependence on supplemental oxygen: Secondary | ICD-10-CM | POA: Diagnosis not present

## 2015-06-03 DIAGNOSIS — J449 Chronic obstructive pulmonary disease, unspecified: Secondary | ICD-10-CM | POA: Diagnosis not present

## 2015-06-03 DIAGNOSIS — E119 Type 2 diabetes mellitus without complications: Secondary | ICD-10-CM | POA: Diagnosis not present

## 2015-06-03 DIAGNOSIS — Z9181 History of falling: Secondary | ICD-10-CM | POA: Diagnosis not present

## 2015-06-03 DIAGNOSIS — Z8701 Personal history of pneumonia (recurrent): Secondary | ICD-10-CM | POA: Diagnosis not present

## 2015-06-03 DIAGNOSIS — Z794 Long term (current) use of insulin: Secondary | ICD-10-CM | POA: Diagnosis not present

## 2015-06-09 DIAGNOSIS — Z9981 Dependence on supplemental oxygen: Secondary | ICD-10-CM | POA: Diagnosis not present

## 2015-06-09 DIAGNOSIS — Z8701 Personal history of pneumonia (recurrent): Secondary | ICD-10-CM | POA: Diagnosis not present

## 2015-06-09 DIAGNOSIS — E119 Type 2 diabetes mellitus without complications: Secondary | ICD-10-CM | POA: Diagnosis not present

## 2015-06-09 DIAGNOSIS — J449 Chronic obstructive pulmonary disease, unspecified: Secondary | ICD-10-CM | POA: Diagnosis not present

## 2015-06-09 DIAGNOSIS — Z794 Long term (current) use of insulin: Secondary | ICD-10-CM | POA: Diagnosis not present

## 2015-06-09 DIAGNOSIS — Z9181 History of falling: Secondary | ICD-10-CM | POA: Diagnosis not present

## 2015-06-11 DIAGNOSIS — Z9181 History of falling: Secondary | ICD-10-CM | POA: Diagnosis not present

## 2015-06-11 DIAGNOSIS — J449 Chronic obstructive pulmonary disease, unspecified: Secondary | ICD-10-CM | POA: Diagnosis not present

## 2015-06-11 DIAGNOSIS — Z794 Long term (current) use of insulin: Secondary | ICD-10-CM | POA: Diagnosis not present

## 2015-06-11 DIAGNOSIS — Z8701 Personal history of pneumonia (recurrent): Secondary | ICD-10-CM | POA: Diagnosis not present

## 2015-06-11 DIAGNOSIS — E119 Type 2 diabetes mellitus without complications: Secondary | ICD-10-CM | POA: Diagnosis not present

## 2015-06-11 DIAGNOSIS — Z9981 Dependence on supplemental oxygen: Secondary | ICD-10-CM | POA: Diagnosis not present

## 2015-07-16 ENCOUNTER — Emergency Department (HOSPITAL_COMMUNITY): Payer: 59

## 2015-07-16 ENCOUNTER — Encounter (HOSPITAL_COMMUNITY): Payer: Self-pay

## 2015-07-16 ENCOUNTER — Emergency Department (HOSPITAL_COMMUNITY)
Admission: EM | Admit: 2015-07-16 | Discharge: 2015-07-16 | Disposition: A | Discharge status: Home / Self Care | Payer: 59 | Attending: Emergency Medicine | Admitting: Emergency Medicine

## 2015-07-16 DIAGNOSIS — S199XXA Unspecified injury of neck, initial encounter: Secondary | ICD-10-CM | POA: Diagnosis not present

## 2015-07-16 DIAGNOSIS — I480 Paroxysmal atrial fibrillation: Secondary | ICD-10-CM | POA: Diagnosis not present

## 2015-07-16 DIAGNOSIS — Z87891 Personal history of nicotine dependence: Secondary | ICD-10-CM | POA: Insufficient documentation

## 2015-07-16 DIAGNOSIS — M549 Dorsalgia, unspecified: Secondary | ICD-10-CM | POA: Diagnosis not present

## 2015-07-16 DIAGNOSIS — R109 Unspecified abdominal pain: Secondary | ICD-10-CM | POA: Diagnosis not present

## 2015-07-16 DIAGNOSIS — K219 Gastro-esophageal reflux disease without esophagitis: Secondary | ICD-10-CM | POA: Diagnosis not present

## 2015-07-16 DIAGNOSIS — Z862 Personal history of diseases of the blood and blood-forming organs and certain disorders involving the immune mechanism: Secondary | ICD-10-CM | POA: Insufficient documentation

## 2015-07-16 DIAGNOSIS — Z86010 Personal history of colonic polyps: Secondary | ICD-10-CM | POA: Insufficient documentation

## 2015-07-16 DIAGNOSIS — Z8701 Personal history of pneumonia (recurrent): Secondary | ICD-10-CM | POA: Insufficient documentation

## 2015-07-16 DIAGNOSIS — G44309 Post-traumatic headache, unspecified, not intractable: Secondary | ICD-10-CM | POA: Diagnosis not present

## 2015-07-16 DIAGNOSIS — Z951 Presence of aortocoronary bypass graft: Secondary | ICD-10-CM | POA: Insufficient documentation

## 2015-07-16 DIAGNOSIS — E119 Type 2 diabetes mellitus without complications: Secondary | ICD-10-CM | POA: Diagnosis not present

## 2015-07-16 DIAGNOSIS — S3991XA Unspecified injury of abdomen, initial encounter: Secondary | ICD-10-CM | POA: Diagnosis not present

## 2015-07-16 DIAGNOSIS — M542 Cervicalgia: Secondary | ICD-10-CM | POA: Diagnosis not present

## 2015-07-16 DIAGNOSIS — Y92410 Unspecified street and highway as the place of occurrence of the external cause: Secondary | ICD-10-CM | POA: Diagnosis not present

## 2015-07-16 DIAGNOSIS — E785 Hyperlipidemia, unspecified: Secondary | ICD-10-CM | POA: Diagnosis not present

## 2015-07-16 DIAGNOSIS — I252 Old myocardial infarction: Secondary | ICD-10-CM | POA: Insufficient documentation

## 2015-07-16 DIAGNOSIS — Y9389 Activity, other specified: Secondary | ICD-10-CM | POA: Insufficient documentation

## 2015-07-16 DIAGNOSIS — Z87442 Personal history of urinary calculi: Secondary | ICD-10-CM | POA: Insufficient documentation

## 2015-07-16 DIAGNOSIS — J449 Chronic obstructive pulmonary disease, unspecified: Secondary | ICD-10-CM | POA: Diagnosis not present

## 2015-07-16 DIAGNOSIS — T148 Other injury of unspecified body region: Secondary | ICD-10-CM | POA: Insufficient documentation

## 2015-07-16 DIAGNOSIS — I5032 Chronic diastolic (congestive) heart failure: Secondary | ICD-10-CM | POA: Diagnosis not present

## 2015-07-16 DIAGNOSIS — Z7951 Long term (current) use of inhaled steroids: Secondary | ICD-10-CM | POA: Insufficient documentation

## 2015-07-16 DIAGNOSIS — I4892 Unspecified atrial flutter: Secondary | ICD-10-CM | POA: Insufficient documentation

## 2015-07-16 DIAGNOSIS — S299XXA Unspecified injury of thorax, initial encounter: Secondary | ICD-10-CM | POA: Diagnosis not present

## 2015-07-16 DIAGNOSIS — I2510 Atherosclerotic heart disease of native coronary artery without angina pectoris: Secondary | ICD-10-CM | POA: Insufficient documentation

## 2015-07-16 DIAGNOSIS — Z79899 Other long term (current) drug therapy: Secondary | ICD-10-CM | POA: Insufficient documentation

## 2015-07-16 DIAGNOSIS — Z9861 Coronary angioplasty status: Secondary | ICD-10-CM | POA: Insufficient documentation

## 2015-07-16 DIAGNOSIS — H269 Unspecified cataract: Secondary | ICD-10-CM | POA: Diagnosis not present

## 2015-07-16 DIAGNOSIS — I10 Essential (primary) hypertension: Secondary | ICD-10-CM | POA: Insufficient documentation

## 2015-07-16 DIAGNOSIS — Y998 Other external cause status: Secondary | ICD-10-CM | POA: Diagnosis not present

## 2015-07-16 DIAGNOSIS — M810 Age-related osteoporosis without current pathological fracture: Secondary | ICD-10-CM | POA: Insufficient documentation

## 2015-07-16 DIAGNOSIS — M1990 Unspecified osteoarthritis, unspecified site: Secondary | ICD-10-CM | POA: Insufficient documentation

## 2015-07-16 DIAGNOSIS — R079 Chest pain, unspecified: Secondary | ICD-10-CM | POA: Diagnosis not present

## 2015-07-16 DIAGNOSIS — Z794 Long term (current) use of insulin: Secondary | ICD-10-CM | POA: Diagnosis not present

## 2015-07-16 LAB — CBC WITH DIFFERENTIAL/PLATELET
BASOS ABS: 0 10*3/uL (ref 0.0–0.1)
Basophils Relative: 0 %
Eosinophils Absolute: 0.3 10*3/uL (ref 0.0–0.7)
Eosinophils Relative: 4 %
HCT: 43.1 % (ref 39.0–52.0)
HEMOGLOBIN: 13.2 g/dL (ref 13.0–17.0)
LYMPHS ABS: 1.1 10*3/uL (ref 0.7–4.0)
LYMPHS PCT: 14 %
MCH: 26.9 pg (ref 26.0–34.0)
MCHC: 30.6 g/dL (ref 30.0–36.0)
MCV: 88 fL (ref 78.0–100.0)
Monocytes Absolute: 0.7 10*3/uL (ref 0.1–1.0)
Monocytes Relative: 9 %
NEUTROS ABS: 5.8 10*3/uL (ref 1.7–7.7)
NEUTROS PCT: 73 %
Platelets: 203 10*3/uL (ref 150–400)
RBC: 4.9 MIL/uL (ref 4.22–5.81)
RDW: 20 % — ABNORMAL HIGH (ref 11.5–15.5)
WBC: 8 10*3/uL (ref 4.0–10.5)

## 2015-07-16 LAB — COMPREHENSIVE METABOLIC PANEL
ALK PHOS: 51 U/L (ref 38–126)
ALT: 17 U/L (ref 17–63)
AST: 20 U/L (ref 15–41)
Albumin: 3 g/dL — ABNORMAL LOW (ref 3.5–5.0)
Anion gap: 12 (ref 5–15)
BUN: 13 mg/dL (ref 6–20)
CALCIUM: 8.6 mg/dL — AB (ref 8.9–10.3)
CHLORIDE: 100 mmol/L — AB (ref 101–111)
CO2: 29 mmol/L (ref 22–32)
CREATININE: 1.14 mg/dL (ref 0.61–1.24)
Glucose, Bld: 200 mg/dL — ABNORMAL HIGH (ref 65–99)
Potassium: 3.9 mmol/L (ref 3.5–5.1)
Sodium: 141 mmol/L (ref 135–145)
Total Bilirubin: 0.7 mg/dL (ref 0.3–1.2)
Total Protein: 6.5 g/dL (ref 6.5–8.1)

## 2015-07-16 LAB — PROTIME-INR
INR: 1.22 (ref 0.00–1.49)
PROTHROMBIN TIME: 15.6 s — AB (ref 11.6–15.2)

## 2015-07-16 MED ORDER — MORPHINE SULFATE (PF) 4 MG/ML IV SOLN
4.0000 mg | Freq: Once | INTRAVENOUS | Status: AC
  Administered 2015-07-16: 4 mg via INTRAVENOUS
  Filled 2015-07-16: qty 1

## 2015-07-16 MED ORDER — IOPAMIDOL (ISOVUE-300) INJECTION 61%
INTRAVENOUS | Status: AC
  Administered 2015-07-16: 100 mL
  Filled 2015-07-16: qty 100
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
## 2015-07-16 NOTE — ED Notes (Signed)
Pt amulated in the hall with this RN with a walker. Pt able to walk to end of the hallway and back. Pt states "I would like to go home but the only person I have at home is my son and he is is worse shape than me" Pt sob by the end of the hall. Escorted back to bed by this RN

## 2015-07-16 NOTE — ED Notes (Signed)
MD in room

## 2015-08-19 IMAGING — CR DG CHEST 2V
2 series · 2 of 2 positions shown · non-contrast
Comparison: 03/19/2014

CLINICAL DATA: Followup pneumonia, history of asthma

EXAM:
CHEST  2 VIEW

[view not recorded (1 of 2)]
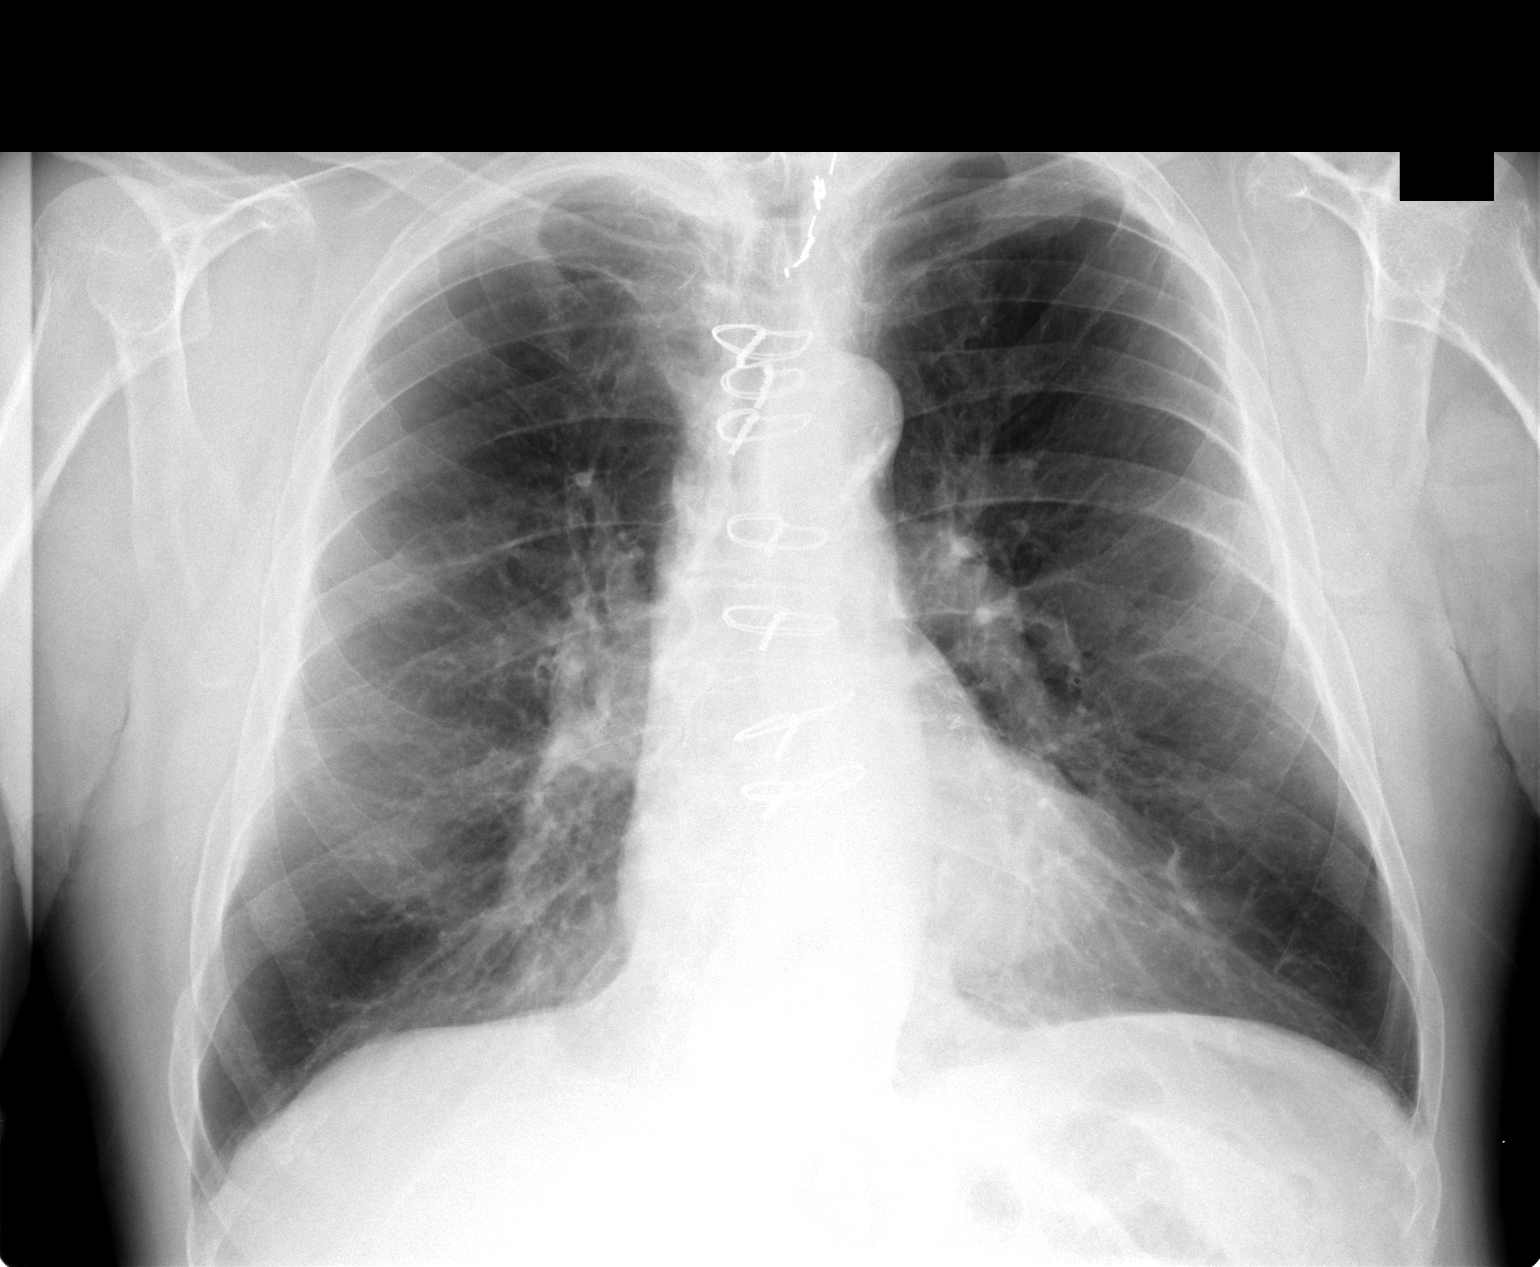

[view not recorded (2 of 2)]
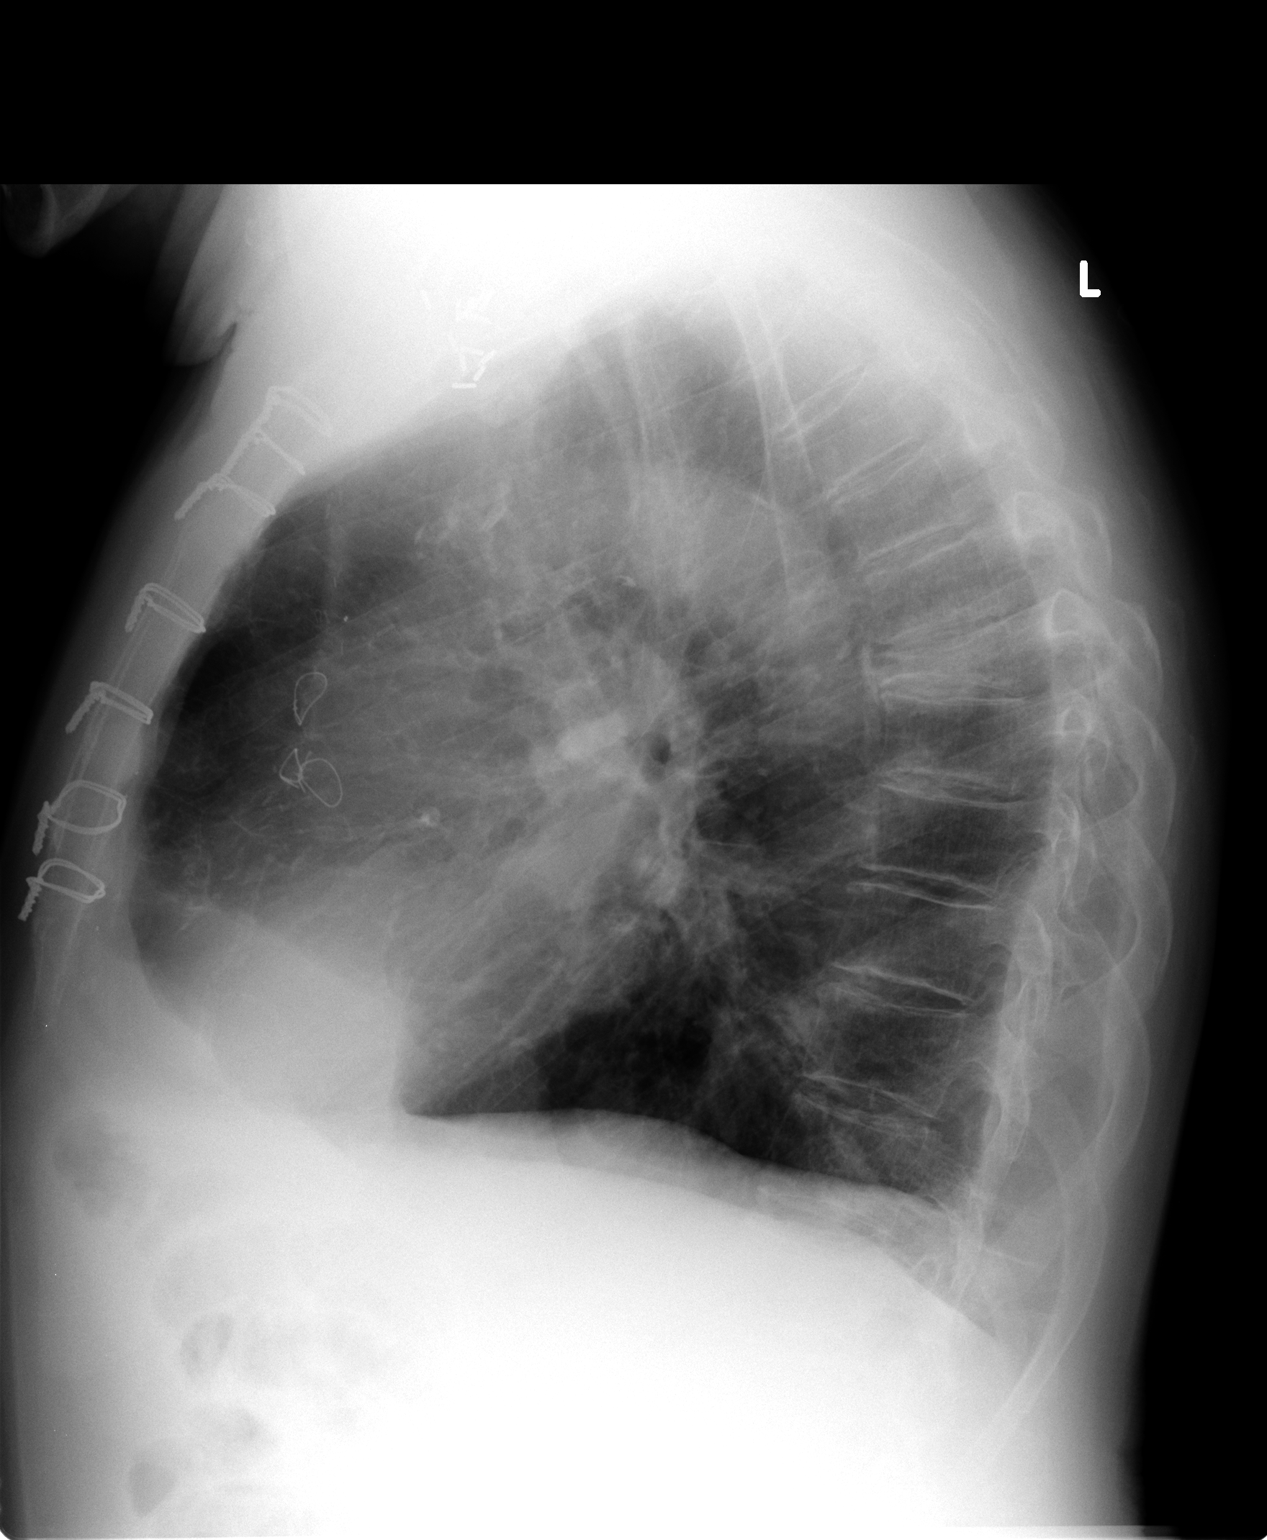

[2 of 2 positions shown; findings below may reference images not displayed]

FINDINGS: Cardiomediastinal silhouette is stable. No acute infiltrate or
pulmonary edema. Old rib fractures are again noted bilaterally. Mild
hyperinflation. Central mild bronchitic changes. Stable compression
deformity mid thoracic spine.
IMPRESSION: No acute infiltrate or pulmonary edema. Mild hyperinflation. Central
mild bronchitic changes. Stable compression deformity mid thoracic
spine.

## 2016-01-18 ENCOUNTER — Encounter: Payer: Self-pay | Admitting: Cardiovascular Disease

## 2016-01-21 ENCOUNTER — Ambulatory Visit (INDEPENDENT_AMBULATORY_CARE_PROVIDER_SITE_OTHER): Payer: Medicare Other | Admitting: Cardiovascular Disease

## 2016-01-21 ENCOUNTER — Encounter (INDEPENDENT_AMBULATORY_CARE_PROVIDER_SITE_OTHER): Payer: Self-pay

## 2016-01-21 ENCOUNTER — Encounter: Payer: Self-pay | Admitting: Cardiovascular Disease

## 2016-01-21 VITALS — BP 110/70 | HR 68 | Ht 63.0 in | Wt 152.6 lb

## 2016-01-21 DIAGNOSIS — I2510 Atherosclerotic heart disease of native coronary artery without angina pectoris: Secondary | ICD-10-CM | POA: Diagnosis not present

## 2016-01-21 DIAGNOSIS — I480 Paroxysmal atrial fibrillation: Secondary | ICD-10-CM

## 2016-01-21 DIAGNOSIS — I10 Essential (primary) hypertension: Secondary | ICD-10-CM | POA: Diagnosis not present

## 2016-01-21 NOTE — Patient Instructions (Addendum)
Medication Instructions:  Your physician has recommended you make the following change in your medication:  1. STOP Amiodarone 2. INCREASE Cardizem CD (Diltiazem) to 240mg  take one capsule by mouth daily  Labwork: No new orders.   Testing/Procedures: No new orders.   Follow-Up: Your physician wants you to follow-up in: 6 MONTHS with Dr Burt Knack.  You will receive a reminder letter in the mail two months in advance. If you don't receive a letter, please call our office to schedule the follow-up appointment.   Any Other Special Instructions Will Be Listed Below (If Applicable).     If you need a refill on your cardiac medications before your next appointment, please call your pharmacy.

## 2016-01-21 NOTE — Progress Notes (Signed)
Cardiology Office Note Date:  01/21/2016   ID:  [name], DOB 06/01/1940, MRN [id number]  PCP:  Reubin Milan, MD  Cardiologist:  Sherren Mocha, MD    Chief Complaint  Patient presents with  . Coronary Artery Disease     History of Present Illness: [name] is a 75 y.o. male who presents for follow-up evaluation. He was last seen by [name] 03/30/2015.  The patient has a couple gated medical history including a history of CAD s/p CABG [id number], chronic diastolic CHF, paroxysmal atrial fib/flutter, PVD s/p prior iliac stenting, severe COPD with chronic respiratory failure on home oxygen, alcohol abuse, pulm nodule, normocytic anemia, rectal bleeding. The patient has not been able to tolerate anticoagulation. He has had bleeding problems with both warfarin and Pradaxa.  The patient is here with his son today. He's been doing okay from a cardiac perspective. Feels like his breathing is slowly getting worse. He continues on home oxygen. He denies any chest pain or pressure, edema, lightheadedness, or syncope. He is having problems with vivid dreams. He has followed out of bed now several times. He's also fallen out of a recliner chair during sleep. He has a clavicular fracture and evidence of new and old rib fractures. He is getting all of his medical care through the [facility] health system. He brings in lab work today suggestive of hypothyroidism with a mildly elevated TSH and a low T3.   Past Medical History:  Diagnosis Date  . Alcohol abuse   . Allergic rhinitis   . ANEMIA, B12 DEFICIENCY 12/02/2008  . Arthritis   . CAD (coronary artery disease)    a.  s/p CABG 2011.  Marland Kitchen CAROTID ARTERY STENOSIS 04/16/2009  . Cataract   . Chronic diastolic CHF (congestive heart failure) ([place])   . Chronic respiratory failure ([place])   . COLONIC POLYPS 05/21/2007  . COPD, severe ([place])   . Diabetes mellitus without complication ([place])   . DIVERTICULAR DISEASE 05/21/2007  . Essential hypertension   .  Falls   . GERD (gastroesophageal reflux disease)   . GI BLEEDING 09/09/2008  . Hemoptysis   . HIATAL HERNIA 05/21/2007  . History of kidney stones   . HYPERLIPIDEMIA 11/21/2006  . Internal hemorrhoids   . MYOCARDIAL INFARCTION, HX OF 11/21/2006  . NEPHROLITHIASIS 05/21/2007  . Normocytic anemia   . NSVT (nonsustained ventricular tachycardia) ([place])    a. Holter 2015: NSR, A. Fib/flutter, short runs of NSVT  . OSTEOPOROSIS 11/21/2006  . Osteoporosis   . Paroxysmal atrial fibrillation ([place])    a. Anticoag stopped due to falls, rectal bleeding.  . Paroxysmal atrial flutter ([place])   . Pneumonia   . Pulmonary nodule    a. Dx 01/2015 -> advised to f/u PCP for PET.  Marland Kitchen PVD (peripheral vascular disease) ([place])    a. s/p prior iliac stenting.    Past Surgical History:  Procedure Laterality Date  . APPENDECTOMY    . CARDIOVERSION N/A 04/16/2014   Procedure: CARDIOVERSION;  Surgeon: Josue Hector, MD;  Location: [facility]COPY;  Service: Cardiovascular;  Laterality: N/A;  . CHOLECYSTECTOMY    . CORONARY ARTERY BYPASS GRAFT  2011  . CORONARY STENT PLACEMENT     03/07/2001  . HEMORRHOID SURGERY    . INGUINAL HERNIA REPAIR  10/10/08  . MASS EXCISION Left 07/09/2014   Procedure: LEFT ELBOW BURSECTOMY EXCISION MASS;  Surgeon: Iran Planas, MD;  Location: [facility];  Service: Orthopedics;  Laterality: Left;  . OLECRANON  BURSECTOMY  07/09/2014  . PTCA    . THYROIDECTOMY     partial    Current Outpatient Prescriptions  Medication Sig Dispense Refill  . acetaminophen (TYLENOL) 500 MG tablet Take 500 mg by mouth every 6 (six) hours as needed for mild pain or moderate pain.    Marland Kitchen albuterol (PROVENTIL) (2.5 MG/3ML) 0.083% nebulizer solution Take 2.5 mg by nebulization every 6 (six) hours as needed for wheezing or shortness of breath.    Marland Kitchen alendronate (FOSAMAX) 70 MG tablet Take 70 mg by mouth once a week. Take with a full glass of water on an empty stomach.    . ALPRAZolam (XANAX) 0.25 MG tablet Take 0.25 mg  by mouth at bedtime as needed for anxiety or sleep.    Marland Kitchen amiodarone (PACERONE) 200 MG tablet TAKE 1 TABLET (200 MG TOTAL) BY MOUTH DAILY. 30 tablet 5  . atorvastatin (LIPITOR) 40 MG tablet Take 40 mg by mouth daily.    . budesonide-formoterol (SYMBICORT) 160-4.5 MCG/ACT inhaler Inhale 2 puffs into the lungs 2 (two) times daily.    . busPIRone (BUSPAR) 7.5 MG tablet Take 7.5 mg by mouth 2 (two) times daily.    . Calcium Carbonate-Vit D-Min (CALTRATE 600+D PLUS) 600-400 MG-UNIT per tablet Chew 1 tablet by mouth daily.      . cetirizine (ZYRTEC ALLERGY) 10 MG tablet Take 1 tablet (10 mg total) by mouth every morning.    . diltiazem (CARDIZEM CD) 180 MG 24 hr capsule Take 1 capsule (180 mg total) by mouth daily. 90 capsule 3  . furosemide (LASIX) 40 MG tablet TAKE 1 TABLET BY MOUTH EVERY DAY 30 tablet 9  . insulin detemir (LEVEMIR) 100 UNIT/ML injection Inject 3 Units into the skin at bedtime.     . insulin lispro (HUMALOG) 100 UNIT/ML injection Inject 3 Units into the skin 3 (three) times daily before meals.     . lactobacillus acidophilus (BACID) TABS tablet Take 2 tablets by mouth 2 (two) times daily.    . OXYGEN Inhale 2 L into the lungs continuous.    . pantoprazole (PROTONIX) 40 MG tablet TAKE 1 TABLET BY MOUTH TWICE A DAY 180 tablet 1  . potassium chloride SA (K-DUR,KLOR-CON) 20 MEQ tablet Take 1 tablet (20 mEq total) by mouth daily. 30 tablet 3  . SPIRIVA HANDIHALER 18 MCG inhalation capsule INHALE CONTENTS OF 1 CAPSULE DAILY 30 capsule 0   No current facility-administered medications for this visit.     Allergies:   Xarelto [rivaroxaban]; Ticlopidine hcl; and Lorazepam   Social History:  The patient  reports that he quit smoking about 6 years ago. His smoking use included Cigarettes. He has a 150.00 pack-year smoking history. He has never used smokeless tobacco. He reports that he drinks about 3.0 oz of alcohol per week . He reports that he does not use drugs.   Family History:  The  patient's  family history includes Arthritis in his mother; Colon cancer (age of onset: 69) in his brother; Heart attack in his father; Heart disease in his brother, brother, and father.    ROS:  Please see the history of present illness.  Otherwise, review of systems is positive for Exertional dyspnea, snoring, wheezing, anxiety.  All other systems are reviewed and negative.    PHYSICAL EXAM: VS:  BP 110/70   Pulse 68   Ht 5\' 3"  (1.6 m)   Wt 69.2 kg (152 lb 9.6 oz)   SpO2 98%   BMI 27.03 kg/m  ,  BMI Body mass index is 27.03 kg/m. GEN: Pleasant elderly male, in no acute distress  HEENT: normal  Neck: no JVD, no masses. No carotid bruits Cardiac: RRR without murmur or gallop, distant heart sounds            Respiratory:  Poor air movement no active wheezing GI: soft, nontender, nondistended, + BS MS: no deformity or atrophy  Ext: no pretibial edema Skin: warm and dry, no rash Neuro:  Strength and sensation are intact Psych: euthymic mood, full affect  EKG:  EKG is not ordered today.  Recent Labs: 02/14/2015: B Natriuretic Peptide 403.1 07/16/2015: ALT 17; BUN 13; Creatinine, Ser 1.14; Hemoglobin 13.2; Platelets 203; Potassium 3.9; Sodium 141   Lipid Panel     Component Value Date/Time   CHOL 135 06/04/2012 0920   TRIG 109.0 06/04/2012 0920   HDL 38.80 (L) 06/04/2012 0920   CHOLHDL 3 06/04/2012 0920   VLDL 21.8 06/04/2012 0920   LDLCALC 74 06/04/2012 0920      Wt Readings from Last 3 Encounters:  01/21/16 69.2 kg (152 lb 9.6 oz)  07/16/15 68.5 kg (151 lb)  03/31/15 68.4 kg (150 lb 12.8 oz)     Cardiac Studies Reviewed: Echo 01/2014: Study Conclusions  - Left ventricle: The cavity size was normal. There was mild concentric hypertrophy. Systolic function was normal. The estimated ejection fraction was in the range of 55% to 60%. Wall motion was normal; there were no regional wall motion abnormalities. - Mitral valve: Calcified annulus. Mildly thickened  leaflets . - Left atrium: The atrium was mildly dilated.  ASSESSMENT AND PLAN: 1.  CAD, native vessel: no angina. Pt s/p CABG. Continue same Rx - medications reviewed today.  2. Atrial fibrillation/flutter, persistent: heart rate controlled on current Rx. However, the patient now has thyroid dysfunction and this may be related to amiodarone. I have had concerns about long-term use of amiodarone especially in the context of his severe underlying lung disease. I think it is best to discontinue this medication. Will increase long-acting diltiazem to 240 mg daily. He has regular medical checkups through the [place] system and if his pulse rate becomes elevated we can adjust medications further. He is unable to tolerate anticoagulation because of recurrent bleeding and frequent falls. Because of his multiple comorbid conditions and severe O2 dependent COPD, I would not consider him a candidate for Watchman implantation.  3. Chronic diastolic CHF: stable on current Rx. Difficult to assess NYHA functional class because of his severe COPD. He has Class III dyspnea.   4. PAD: previous iliac stenting. No active issues. Continue medical therapy.  Current medicines are reviewed with the patient today.  The patient does not have concerns regarding medicines.  Labs/ tests ordered today include:  No orders of the defined types were placed in this encounter.   Disposition:   FU 6 months  Signed, Sherren Mocha, MD  01/21/2016 10:27 AM    [address], [place], [place]  [address] Phone: [phone]; Fax: [phone]

## 2016-02-22 ENCOUNTER — Inpatient Hospital Stay (HOSPITAL_COMMUNITY)
Admission: EM | Admit: 2016-02-22 | Discharge: 2016-02-25 | DRG: 190 | Disposition: A | Discharge status: Home / Self Care | Payer: Medicare Other | Attending: Internal Medicine | Admitting: Internal Medicine

## 2016-02-22 ENCOUNTER — Emergency Department (HOSPITAL_COMMUNITY): Payer: Medicare Other

## 2016-02-22 ENCOUNTER — Encounter (HOSPITAL_COMMUNITY): Payer: Self-pay

## 2016-02-22 DIAGNOSIS — I252 Old myocardial infarction: Secondary | ICD-10-CM

## 2016-02-22 DIAGNOSIS — I480 Paroxysmal atrial fibrillation: Secondary | ICD-10-CM | POA: Diagnosis present

## 2016-02-22 DIAGNOSIS — Z951 Presence of aortocoronary bypass graft: Secondary | ICD-10-CM

## 2016-02-22 DIAGNOSIS — K219 Gastro-esophageal reflux disease without esophagitis: Secondary | ICD-10-CM | POA: Diagnosis present

## 2016-02-22 DIAGNOSIS — Z9049 Acquired absence of other specified parts of digestive tract: Secondary | ICD-10-CM

## 2016-02-22 DIAGNOSIS — R0602 Shortness of breath: Secondary | ICD-10-CM | POA: Diagnosis not present

## 2016-02-22 DIAGNOSIS — Z87891 Personal history of nicotine dependence: Secondary | ICD-10-CM | POA: Diagnosis not present

## 2016-02-22 DIAGNOSIS — I493 Ventricular premature depolarization: Secondary | ICD-10-CM | POA: Diagnosis present

## 2016-02-22 DIAGNOSIS — Z86010 Personal history of colonic polyps: Secondary | ICD-10-CM

## 2016-02-22 DIAGNOSIS — I2510 Atherosclerotic heart disease of native coronary artery without angina pectoris: Secondary | ICD-10-CM | POA: Diagnosis present

## 2016-02-22 DIAGNOSIS — I5032 Chronic diastolic (congestive) heart failure: Secondary | ICD-10-CM | POA: Diagnosis present

## 2016-02-22 DIAGNOSIS — T380X5A Adverse effect of glucocorticoids and synthetic analogues, initial encounter: Secondary | ICD-10-CM | POA: Diagnosis present

## 2016-02-22 DIAGNOSIS — M810 Age-related osteoporosis without current pathological fracture: Secondary | ICD-10-CM | POA: Diagnosis present

## 2016-02-22 DIAGNOSIS — J441 Chronic obstructive pulmonary disease with (acute) exacerbation: Secondary | ICD-10-CM | POA: Diagnosis not present

## 2016-02-22 DIAGNOSIS — J9620 Acute and chronic respiratory failure, unspecified whether with hypoxia or hypercapnia: Secondary | ICD-10-CM | POA: Diagnosis present

## 2016-02-22 DIAGNOSIS — F419 Anxiety disorder, unspecified: Secondary | ICD-10-CM | POA: Diagnosis present

## 2016-02-22 DIAGNOSIS — F329 Major depressive disorder, single episode, unspecified: Secondary | ICD-10-CM | POA: Diagnosis present

## 2016-02-22 DIAGNOSIS — R071 Chest pain on breathing: Secondary | ICD-10-CM | POA: Diagnosis not present

## 2016-02-22 DIAGNOSIS — I10 Essential (primary) hypertension: Secondary | ICD-10-CM | POA: Diagnosis present

## 2016-02-22 DIAGNOSIS — Z955 Presence of coronary angioplasty implant and graft: Secondary | ICD-10-CM

## 2016-02-22 DIAGNOSIS — E119 Type 2 diabetes mellitus without complications: Secondary | ICD-10-CM | POA: Diagnosis present

## 2016-02-22 DIAGNOSIS — J9621 Acute and chronic respiratory failure with hypoxia: Secondary | ICD-10-CM | POA: Diagnosis present

## 2016-02-22 DIAGNOSIS — R079 Chest pain, unspecified: Secondary | ICD-10-CM

## 2016-02-22 DIAGNOSIS — I4891 Unspecified atrial fibrillation: Secondary | ICD-10-CM | POA: Diagnosis present

## 2016-02-22 DIAGNOSIS — I110 Hypertensive heart disease with heart failure: Secondary | ICD-10-CM | POA: Diagnosis present

## 2016-02-22 DIAGNOSIS — Z79899 Other long term (current) drug therapy: Secondary | ICD-10-CM

## 2016-02-22 DIAGNOSIS — E785 Hyperlipidemia, unspecified: Secondary | ICD-10-CM | POA: Diagnosis present

## 2016-02-22 DIAGNOSIS — D72829 Elevated white blood cell count, unspecified: Secondary | ICD-10-CM | POA: Diagnosis not present

## 2016-02-22 DIAGNOSIS — Z66 Do not resuscitate: Secondary | ICD-10-CM | POA: Diagnosis present

## 2016-02-22 DIAGNOSIS — Z794 Long term (current) use of insulin: Secondary | ICD-10-CM

## 2016-02-22 DIAGNOSIS — Z8249 Family history of ischemic heart disease and other diseases of the circulatory system: Secondary | ICD-10-CM

## 2016-02-22 DIAGNOSIS — Z9981 Dependence on supplemental oxygen: Secondary | ICD-10-CM

## 2016-02-22 LAB — I-STAT ARTERIAL BLOOD GAS, ED
ACID-BASE EXCESS: 2 mmol/L (ref 0.0–2.0)
Bicarbonate: 27.7 mmol/L (ref 20.0–28.0)
O2 Saturation: 92 %
PCO2 ART: 44 mmHg (ref 32.0–48.0)
PH ART: 7.404 (ref 7.350–7.450)
Patient temperature: 97.5
TCO2: 29 mmol/L (ref 0–100)
pO2, Arterial: 61 mmHg — ABNORMAL LOW (ref 83.0–108.0)

## 2016-02-22 LAB — CBC
HEMATOCRIT: 43.5 % (ref 39.0–52.0)
Hemoglobin: 14.3 g/dL (ref 13.0–17.0)
MCH: 32.1 pg (ref 26.0–34.0)
MCHC: 32.9 g/dL (ref 30.0–36.0)
MCV: 97.8 fL (ref 78.0–100.0)
Platelets: 150 10*3/uL (ref 150–400)
RBC: 4.45 MIL/uL (ref 4.22–5.81)
RDW: 14.6 % (ref 11.5–15.5)
WBC: 8.5 10*3/uL (ref 4.0–10.5)

## 2016-02-22 LAB — I-STAT CHEM 8, ED
BUN: 19 mg/dL (ref 6–20)
CHLORIDE: 101 mmol/L (ref 101–111)
Calcium, Ion: 1.05 mmol/L — ABNORMAL LOW (ref 1.15–1.40)
Creatinine, Ser: 1.1 mg/dL (ref 0.61–1.24)
Glucose, Bld: 183 mg/dL — ABNORMAL HIGH (ref 65–99)
HEMATOCRIT: 43 % (ref 39.0–52.0)
HEMOGLOBIN: 14.6 g/dL (ref 13.0–17.0)
POTASSIUM: 3.8 mmol/L (ref 3.5–5.1)
SODIUM: 143 mmol/L (ref 135–145)
TCO2: 29 mmol/L (ref 0–100)

## 2016-02-22 LAB — DIFFERENTIAL
Basophils Absolute: 0 10*3/uL (ref 0.0–0.1)
Basophils Relative: 0 %
Eosinophils Absolute: 0.3 10*3/uL (ref 0.0–0.7)
Eosinophils Relative: 3 %
LYMPHS PCT: 31 %
Lymphs Abs: 2.7 10*3/uL (ref 0.7–4.0)
MONO ABS: 0.9 10*3/uL (ref 0.1–1.0)
MONOS PCT: 11 %
NEUTROS ABS: 4.7 10*3/uL (ref 1.7–7.7)
Neutrophils Relative %: 55 %

## 2016-02-22 LAB — I-STAT TROPONIN, ED: TROPONIN I, POC: 0 ng/mL (ref 0.00–0.08)

## 2016-02-22 LAB — PROTIME-INR
INR: 1.1
PROTHROMBIN TIME: 14.2 s (ref 11.4–15.2)

## 2016-02-22 LAB — APTT: aPTT: 33 seconds (ref 24–36)

## 2016-02-22 SURGERY — LEFT HEART CATH AND CORONARY ANGIOGRAPHY
Anesthesia: Moderate Sedation

## 2016-02-22 MED ORDER — METHYLPREDNISOLONE SODIUM SUCC 125 MG IJ SOLR
125.0000 mg | Freq: Once | INTRAMUSCULAR | Status: AC
  Administered 2016-02-22: 125 mg via INTRAVENOUS
  Filled 2016-02-22: qty 2

## 2016-02-22 NOTE — ED Provider Notes (Signed)
[facility] DEPT Provider Note   CSN: [id number] Arrival date & time: 02/22/16  2321  By signing my name below, I, Gwenlyn Fudge, attest that this documentation has been prepared under the direction and in the presence of Orpah Greek, MD. Electronically Signed: Gwenlyn Fudge, ED Scribe. 02/22/16. 12:35 AM.   History   Chief Complaint Chief Complaint  Patient presents with  . Chest Pain  . Shortness of Breath   The history is provided by the patient and the EMS personnel. No language interpreter was used.    HPI Comments: [name] is a 75 y.o. male with PMHx of CAD, COPD, CHF, DM, HTN, and HLDwho presents to the Emergency Department complaining via EMS of gradual onset, constant 9/10 central chest pain and shortness of breath onset 7 PM. Pt reports associated nonproductive cough. Per EMS, pt has hx of 5 bypasses, 8 cardiac stents. Pt received 1 nitroglycerin, Asprin, and 10x Albuterol by EMS. After receiving the breathing treatment, chest pain and shortness of breath began to subside. Per pt, chest pain felt different from previous MI's, but feels similar to indigestion. Pt is on home oxygen that he primarily uses during the night time. He states he is supposed to use oxygen all day, but does not use oxygen when he leaves his house frequently. Pt denies current chest pain, but currently has a burning sensation in his throat.  Past Medical History:  Diagnosis Date  . Alcohol abuse   . Allergic rhinitis   . ANEMIA, B12 DEFICIENCY 12/02/2008  . Arthritis   . CAD (coronary artery disease)    a.  s/p CABG 2011.  Marland Kitchen CAROTID ARTERY STENOSIS 04/16/2009  . Cataract   . Chronic diastolic CHF (congestive heart failure) ([place])   . Chronic respiratory failure ([place])   . COLONIC POLYPS 05/21/2007  . COPD, severe ([place])   . Diabetes mellitus without complication ([place])   . DIVERTICULAR DISEASE 05/21/2007  . Essential hypertension   . Falls   . GERD (gastroesophageal reflux disease)   . GI  BLEEDING 09/09/2008  . Hemoptysis   . HIATAL HERNIA 05/21/2007  . History of kidney stones   . HYPERLIPIDEMIA 11/21/2006  . Internal hemorrhoids   . MYOCARDIAL INFARCTION, HX OF 11/21/2006  . NEPHROLITHIASIS 05/21/2007  . Normocytic anemia   . NSVT (nonsustained ventricular tachycardia) ([place])    a. Holter 2015: NSR, A. Fib/flutter, short runs of NSVT  . OSTEOPOROSIS 11/21/2006  . Osteoporosis   . Paroxysmal atrial fibrillation ([place])    a. Anticoag stopped due to falls, rectal bleeding.  . Paroxysmal atrial flutter ([place])   . Pneumonia   . Pulmonary nodule    a. Dx 01/2015 -> advised to f/u PCP for PET.  Marland Kitchen PVD (peripheral vascular disease) ([place])    a. s/p prior iliac stenting.    Patient Active Problem List   Diagnosis Date Noted  . Lung nodule, solitary   . HCAP (healthcare-associated pneumonia)   . Iron deficiency anemia 02/15/2015  . CAD (coronary artery disease) 09/18/2014  . Compression fracture of L1 lumbar vertebra (H[facility])   . Syncope 08/23/2014  . Low back pain 08/23/2014  . Hypertension 08/23/2014  . COPD (chronic obstructive pulmonary disease) ([place]) 08/23/2014  . Chronic atrial fibrillation ([place]) 08/23/2014  . Syncope and collapse   . Olecranon bursitis 07/09/2014  . Diabetes mellitus type II, controlled ([place]) 07/01/2014  . Chronic anticoagulation 05/29/2014  . Diastolic CHF ([place]) [id number]  . CAP (community acquired pneumonia) 04/03/2014  .  Allergic rhinitis 02/06/2014  . GERD (gastroesophageal reflux disease) 02/06/2014  . Do not resuscitate 02/06/2014  . Erectile dysfunction 06/18/2010  . Atrial fibrillation ([place]) 11/06/2009  . Peripheral vascular disease ([place]) 09/02/2009  . Carotid artery stenosis 04/16/2009  . ANEMIA, B12 DEFICIENCY 12/02/2008  . COPD GOLD III 05/21/2007  . TOBACCO USE 01/19/2007  . Hyperlipidemia 11/21/2006  . Essential hypertension 11/21/2006  . Hx of CABG 11/21/2006  . Osteoporosis 11/21/2006    Past Surgical History:  Procedure  Laterality Date  . APPENDECTOMY    . CARDIOVERSION N/A 04/16/2014   Procedure: CARDIOVERSION;  Surgeon: Josue Hector, MD;  Location[facility] ENDOSCOPY;  Service: Cardiovascular;  Laterality: N/A;  . CHOLECYSTECTOMY    . CORONARY ARTERY BYPASS GRAFT  2011  . CORONARY STENT PLACEMENT     03/07/2001  . HEMORRHOID SURGERY    . INGUINAL HERNIA REPAIR  10/10/08  . MASS EXCISION Left 07/09/2014   Procedure: LEFT ELBOW BURSECTOMY EXCISION MASS;  Surgeon: Iran Planas, MD;  Location: [facility];  Service: Orthopedics;  Laterality: Left;  . OLECRANON BURSECTOMY  07/09/2014  . PTCA    . THYROIDECTOMY     partial    Home Medications    Prior to Admission medications   Medication Sig Start Date End Date Taking? Authorizing Provider  acetaminophen (TYLENOL) 500 MG tablet Take 500 mg by mouth every 6 (six) hours as needed for mild pain or moderate pain.    Historical Provider, MD  albuterol (PROVENTIL) (2.5 MG/3ML) 0.083% nebulizer solution Take 2.5 mg by nebulization every 6 (six) hours as needed for wheezing or shortness of breath.    Historical Provider, MD  alendronate (FOSAMAX) 70 MG tablet Take 70 mg by mouth once a week. Take with a full glass of water on an empty stomach.    Historical Provider, MD  ALPRAZolam Duanne Moron) 0.25 MG tablet Take 0.25 mg by mouth at bedtime as needed for anxiety or sleep.    Historical Provider, MD  atorvastatin (LIPITOR) 40 MG tablet Take 40 mg by mouth daily.    Historical Provider, MD  budesonide-formoterol (SYMBICORT) 160-4.5 MCG/ACT inhaler Inhale 2 puffs into the lungs 2 (two) times daily.    Historical Provider, MD  busPIRone (BUSPAR) 7.5 MG tablet Take 7.5 mg by mouth 2 (two) times daily.    Historical Provider, MD  Calcium Carbonate-Vit D-Min (CALTRATE 600+D PLUS) 600-400 MG-UNIT per tablet Chew 1 tablet by mouth daily.      Historical Provider, MD  cetirizine (ZYRTEC ALLERGY) 10 MG tablet Take 1 tablet (10 mg total) by mouth every morning. 08/17/10   Tammy S Parrett, NP    diltiazem (CARDIZEM CD) 240 MG 24 hr capsule Take 1 capsule (240 mg total) by mouth daily. 01/21/16   Sherren Mocha, MD  furosemide (LASIX) 40 MG tablet TAKE 1 TABLET BY MOUTH EVERY DAY 10/20/14   Burtis Junes, NP  insulin detemir (LEVEMIR) 100 UNIT/ML injection Inject 3 Units into the skin at bedtime.     Historical Provider, MD  insulin lispro (HUMALOG) 100 UNIT/ML injection Inject 3 Units into the skin 3 (three) times daily before meals.     Historical Provider, MD  lactobacillus acidophilus (BACID) TABS tablet Take 2 tablets by mouth 2 (two) times daily.    Historical Provider, MD  OXYGEN Inhale 2 L into the lungs continuous.    Historical Provider, MD  pantoprazole (PROTONIX) 40 MG tablet TAKE 1 TABLET BY MOUTH TWICE A DAY 05/19/14   Marin Olp,  MD  potassium chloride SA (K-DUR,KLOR-CON) 20 MEQ tablet Take 1 tablet (20 mEq total) by mouth daily. 03/17/14   Burtis Junes, NP  SPIRIVA HANDIHALER 18 MCG inhalation capsule INHALE CONTENTS OF 1 CAPSULE DAILY 01/24/14   Lisabeth Pick, MD    Family History Family History  Problem Relation Age of Onset  . Arthritis Mother     rheumatoid  . Heart attack Father   . Heart disease Father   . Heart disease Brother   . Colon cancer Brother 79  . Heart disease Brother   . Stomach cancer Neg Hx     Social History Social History  Substance Use Topics  . Smoking status: Former Smoker    Packs/day: 3.00    Years: 50.00    Types: Cigarettes    Quit date: 09/25/2009  . Smokeless tobacco: Never Used  . Alcohol use 3.0 oz/week    6 Standard drinks or equivalent per week     Comment: [place] 2-3 a night    Allergies   Xarelto [rivaroxaban]; Ticlopidine hcl; and Lorazepam  Review of Systems Review of Systems  HENT: Positive for sore throat.   Respiratory: Positive for cough and shortness of breath.   Cardiovascular: Positive for chest pain.  All other systems reviewed and are negative.  Physical Exam Updated Vital Signs BP  121/66   Pulse 72   Temp 97.5 F (36.4 C) (Oral)   Resp 20   Ht 5\' 2"  (1.575 m)   Wt 152 lb (68.9 kg)   SpO2 92%   BMI 27.80 kg/m   Physical Exam  Constitutional: He is oriented to person, place, and time. He appears well-developed and well-nourished. No distress.  HENT:  Head: Normocephalic and atraumatic.  Right Ear: Hearing normal.  Left Ear: Hearing normal.  Nose: Nose normal.  Mouth/Throat: Oropharynx is clear and moist and mucous membranes are normal.  Eyes: Conjunctivae and EOM are normal. Pupils are equal, round, and reactive to light.  Neck: Normal range of motion. Neck supple.  Cardiovascular: Regular rhythm, S1 normal and S2 normal.  Exam reveals no gallop and no friction rub.   No murmur heard. Pulmonary/Chest: Accessory muscle usage present. Tachypnea noted. He is in respiratory distress. He has wheezes. He exhibits no tenderness.  Pt is still in respiratory distress Tachypnea and accessory muscle usage Decreased breath sounds with sign of wheezing throughout  Abdominal: Soft. Normal appearance and bowel sounds are normal. There is no hepatosplenomegaly. There is no tenderness. There is no rebound, no guarding, no tenderness at McBurney's point and negative Murphy's sign. No hernia.  Musculoskeletal: Normal range of motion.  Neurological: He is alert and oriented to person, place, and time. He has normal strength. No cranial nerve deficit or sensory deficit. Coordination normal. GCS eye subscore is 4. GCS verbal subscore is 5. GCS motor subscore is 6.  Skin: Skin is warm, dry and intact. No rash noted. No cyanosis.  Psychiatric: He has a normal mood and affect. His speech is normal and behavior is normal. Thought content normal.  Nursing note and vitals reviewed.   ED Treatments / Results  DIAGNOSTIC STUDIES: Oxygen Saturation is 89% on RA, low by my interpretation.    COORDINATION OF CARE: 11:38 PM Discussed treatment plan with pt at bedside which includes  Cardiac Monitoring and pt agreed to plan.  Labs (all labs ordered are listed, but only abnormal results are displayed) Labs Reviewed  COMPREHENSIVE METABOLIC PANEL - Abnormal; Notable for the following:  Result Value   Glucose, Bld 187 (*)    Calcium 8.0 (*)    Total Protein 5.3 (*)    Albumin 3.1 (*)    ALT 15 (*)    All other components within normal limits  LIPID PANEL - Abnormal; Notable for the following:    HDL 29 (*)    All other components within normal limits  I-STAT CHEM 8, ED - Abnormal; Notable for the following:    Glucose, Bld 183 (*)    Calcium, Ion 1.05 (*)    All other components within normal limits  I-STAT ARTERIAL BLOOD GAS, ED - Abnormal; Notable for the following:    pO2, Arterial 61.0 (*)    All other components within normal limits  CBC  DIFFERENTIAL  PROTIME-INR  APTT  TROPONIN I  I-STAT TROPOININ, ED    EKG  EKG Interpretation  Date/Time:  Monday February 22 2016 23:31:40 EST Ventricular Rate:  94 PR Interval:    QRS Duration: 112 QT Interval:  428 QTC Calculation: 497 R Axis:   42 Text Interpretation:  Sinus rhythm Ventricular bigeminy Anterior infarct, old Borderline T abnormalities, inferior leads No significant change since last tracing except PVCs Confirmed by POLLINA  MD, CHRISTOPHER [phone]) on 02/23/2016 12:16:38 AM       Radiology Dg Chest Port 1 View  Result Date: 02/23/2016 CLINICAL DATA:  Chest pain and shortness of breath tonight. EXAM: PORTABLE CHEST 1 VIEW COMPARISON:  02/18/2015 FINDINGS: Postoperative changes in the mediastinum. Shallow inspiration. Heart size and pulmonary vascularity are normal. Diffuse interstitial fibrosis throughout the lungs. No focal airspace disease or consolidation. No blunting of costophrenic angles. No pneumothorax. Calcified and tortuous aorta. Surgical clips in the base of the neck. IMPRESSION: Shallow inspiration. Diffuse pulmonary fibrosis. No evidence of active pulmonary disease.  Electronically Signed   By: Lucienne Capers M.D.   On: 02/23/2016 00:07    Procedures Procedures (including critical care time)  Medications Ordered in ED Medications  methylPREDNISolone sodium succinate (SOLU-MEDROL) 125 mg/2 mL injection 125 mg (125 mg Intravenous Given 02/22/16 2341)     Initial Impression / Assessment and Plan / ED Course  I have reviewed the triage vital signs and the nursing notes.  Pertinent labs & imaging results that were available during my care of the patient were reviewed by me and considered in my medical decision making (see chart for details).  Clinical Course   Patient brought to the ER from home by EMS. Patient reports that he has had cough and congestion for several days, progressively worsening shortness of breath. Cough is productive of thick sputum. He has not had a fever. Tonight he started having burning pain in the center of his chest, "like indigestion". He has had multiple MI in the past. Pain today is different. EMS was concerned about EKG tracing, initiated code STEMI. Patient administered aspirin and nitroglycerin. No relief of pain with this treatment. He then started to wheeze and was administered albuterol and Atrovent. This resolved his chest pain and he started to breathe better. At arrival he was still in some mild respiratory distress but this has resolved. Chest x-ray does not show pneumonia. Initial cardiac workup negative. Blood gas does show hypoxia. Patient will require hospitalization for further management of respiratory failure secondary to COPD exacerbation.  I personally performed the services described in this documentation, which was scribed in my presence. The recorded information has been reviewed and is accurate.   Final Clinical Impressions(s) / ED Diagnoses  Final diagnoses:  COPD exacerbation [facility])    New Prescriptions New Prescriptions   No medications on file     Orpah Greek, MD 02/23/16 [phone]

## 2016-02-22 NOTE — Consult Note (Signed)
   STEMI code called for [name] prior to arrival based on ECG obtained in the field.  On arrival he was in moderate respiratory distress but CP free.  ECG shows atrial fibrillation with ventricular bigeminy and evidence of an old anteroseptal infarct but no acute ST/Twave changes indicative of ischemia.  ECG was also reviewed with the interventionalist on call, Dr. Claiborne Billings, who agreed with this interpretation and the STEMI code was cancelled. The pt's presentation appears to be due to an acute COPD exacerbation rather than a primary cardiac etiology.  There is no indication for urgent coronary/graft angiography at this time.  Clayborne Dana MD

## 2016-02-22 NOTE — ED Triage Notes (Signed)
Pt comes from home via [facility], c/o CP and SOB that started at 7pm, pt has hx of 5 bypasses and 8 stents, depression in v3-V6 and AVF  PTA received 324 ASA, one nitro, 700 NS, 10 albuterol, 0.5 atrovent

## 2016-02-22 NOTE — ED Notes (Signed)
Cancel code stemi per cards

## 2016-02-23 ENCOUNTER — Encounter (HOSPITAL_COMMUNITY): Payer: Self-pay

## 2016-02-23 ENCOUNTER — Ambulatory Visit (HOSPITAL_COMMUNITY): Admit: 2016-02-23 | Payer: Medicare Other | Admitting: Cardiovascular Disease

## 2016-02-23 DIAGNOSIS — I5032 Chronic diastolic (congestive) heart failure: Secondary | ICD-10-CM | POA: Diagnosis not present

## 2016-02-23 DIAGNOSIS — J441 Chronic obstructive pulmonary disease with (acute) exacerbation: Secondary | ICD-10-CM | POA: Diagnosis not present

## 2016-02-23 DIAGNOSIS — I252 Old myocardial infarction: Secondary | ICD-10-CM | POA: Diagnosis not present

## 2016-02-23 DIAGNOSIS — Z951 Presence of aortocoronary bypass graft: Secondary | ICD-10-CM | POA: Diagnosis not present

## 2016-02-23 DIAGNOSIS — Z79899 Other long term (current) drug therapy: Secondary | ICD-10-CM | POA: Diagnosis not present

## 2016-02-23 DIAGNOSIS — R079 Chest pain, unspecified: Secondary | ICD-10-CM | POA: Diagnosis not present

## 2016-02-23 DIAGNOSIS — K219 Gastro-esophageal reflux disease without esophagitis: Secondary | ICD-10-CM | POA: Diagnosis present

## 2016-02-23 DIAGNOSIS — Z87891 Personal history of nicotine dependence: Secondary | ICD-10-CM | POA: Diagnosis not present

## 2016-02-23 DIAGNOSIS — I480 Paroxysmal atrial fibrillation: Secondary | ICD-10-CM

## 2016-02-23 DIAGNOSIS — Z794 Long term (current) use of insulin: Secondary | ICD-10-CM | POA: Diagnosis not present

## 2016-02-23 DIAGNOSIS — Z9981 Dependence on supplemental oxygen: Secondary | ICD-10-CM | POA: Diagnosis not present

## 2016-02-23 DIAGNOSIS — E785 Hyperlipidemia, unspecified: Secondary | ICD-10-CM | POA: Diagnosis present

## 2016-02-23 DIAGNOSIS — Z8249 Family history of ischemic heart disease and other diseases of the circulatory system: Secondary | ICD-10-CM | POA: Diagnosis not present

## 2016-02-23 DIAGNOSIS — E119 Type 2 diabetes mellitus without complications: Secondary | ICD-10-CM | POA: Diagnosis present

## 2016-02-23 DIAGNOSIS — Z955 Presence of coronary angioplasty implant and graft: Secondary | ICD-10-CM | POA: Diagnosis not present

## 2016-02-23 DIAGNOSIS — Z66 Do not resuscitate: Secondary | ICD-10-CM | POA: Diagnosis present

## 2016-02-23 DIAGNOSIS — Z86010 Personal history of colonic polyps: Secondary | ICD-10-CM | POA: Diagnosis not present

## 2016-02-23 DIAGNOSIS — D72829 Elevated white blood cell count, unspecified: Secondary | ICD-10-CM | POA: Diagnosis present

## 2016-02-23 DIAGNOSIS — I2510 Atherosclerotic heart disease of native coronary artery without angina pectoris: Secondary | ICD-10-CM | POA: Diagnosis not present

## 2016-02-23 DIAGNOSIS — J9621 Acute and chronic respiratory failure with hypoxia: Secondary | ICD-10-CM

## 2016-02-23 DIAGNOSIS — I110 Hypertensive heart disease with heart failure: Secondary | ICD-10-CM | POA: Diagnosis present

## 2016-02-23 DIAGNOSIS — T380X5A Adverse effect of glucocorticoids and synthetic analogues, initial encounter: Secondary | ICD-10-CM | POA: Diagnosis present

## 2016-02-23 DIAGNOSIS — M810 Age-related osteoporosis without current pathological fracture: Secondary | ICD-10-CM | POA: Diagnosis present

## 2016-02-23 DIAGNOSIS — I493 Ventricular premature depolarization: Secondary | ICD-10-CM | POA: Diagnosis present

## 2016-02-23 DIAGNOSIS — Z9049 Acquired absence of other specified parts of digestive tract: Secondary | ICD-10-CM | POA: Diagnosis not present

## 2016-02-23 DIAGNOSIS — R0602 Shortness of breath: Secondary | ICD-10-CM | POA: Diagnosis not present

## 2016-02-23 LAB — RESPIRATORY PANEL BY PCR
ADENOVIRUS-RVPPCR: NOT DETECTED
BORDETELLA PERTUSSIS-RVPCR: NOT DETECTED
CHLAMYDOPHILA PNEUMONIAE-RVPPCR: NOT DETECTED
CORONAVIRUS 229E-RVPPCR: NOT DETECTED
CORONAVIRUS HKU1-RVPPCR: NOT DETECTED
CORONAVIRUS NL63-RVPPCR: NOT DETECTED
Coronavirus OC43: NOT DETECTED
Influenza A: NOT DETECTED
Influenza B: NOT DETECTED
MYCOPLASMA PNEUMONIAE-RVPPCR: NOT DETECTED
Metapneumovirus: NOT DETECTED
Parainfluenza Virus 1: NOT DETECTED
Parainfluenza Virus 2: NOT DETECTED
Parainfluenza Virus 3: NOT DETECTED
Parainfluenza Virus 4: NOT DETECTED
Respiratory Syncytial Virus: NOT DETECTED
Rhinovirus / Enterovirus: NOT DETECTED

## 2016-02-23 LAB — COMPREHENSIVE METABOLIC PANEL
ALBUMIN: 3.1 g/dL — AB (ref 3.5–5.0)
ALT: 15 U/L — ABNORMAL LOW (ref 17–63)
AST: 19 U/L (ref 15–41)
Alkaline Phosphatase: 51 U/L (ref 38–126)
Anion gap: 8 (ref 5–15)
BUN: 17 mg/dL (ref 6–20)
CHLORIDE: 105 mmol/L (ref 101–111)
CO2: 26 mmol/L (ref 22–32)
Calcium: 8 mg/dL — ABNORMAL LOW (ref 8.9–10.3)
Creatinine, Ser: 1.12 mg/dL (ref 0.61–1.24)
GFR calc Af Amer: >60 mL/min (ref >60)
GFR calc non Af Amer: >60 mL/min (ref >60)
GLUCOSE: 187 mg/dL — AB (ref 65–99)
POTASSIUM: 3.9 mmol/L (ref 3.5–5.1)
SODIUM: 139 mmol/L (ref 135–145)
Total Bilirubin: 0.4 mg/dL (ref 0.3–1.2)
Total Protein: 5.3 g/dL — ABNORMAL LOW (ref 6.5–8.1)

## 2016-02-23 LAB — LIPID PANEL
CHOL/HDL RATIO: 3.3 ratio
Cholesterol: 95 mg/dL (ref 0–200)
HDL: 29 mg/dL — AB (ref >40)
LDL CALC: 47 mg/dL (ref 0–99)
TRIGLYCERIDES: 96 mg/dL (ref <150)
VLDL: 19 mg/dL (ref 0–40)

## 2016-02-23 LAB — TROPONIN I
Troponin I: <0.03 ng/mL (ref <0.03)
Troponin I: <0.03 ng/mL (ref <0.03)
Troponin I: <0.03 ng/mL (ref <0.03)
Troponin I: <0.03 ng/mL (ref <0.03)

## 2016-02-23 LAB — INFLUENZA PANEL BY PCR (TYPE A & B)
INFLAPCR: NEGATIVE
INFLBPCR: NEGATIVE

## 2016-02-23 LAB — GLUCOSE, CAPILLARY
GLUCOSE-CAPILLARY: 214 mg/dL — AB (ref 65–99)
GLUCOSE-CAPILLARY: 320 mg/dL — AB (ref 65–99)
GLUCOSE-CAPILLARY: 202 mg/dL — AB (ref 65–99)
GLUCOSE-CAPILLARY: 243 mg/dL — AB (ref 65–99)
Glucose-Capillary: 251 mg/dL — ABNORMAL HIGH (ref 65–99)

## 2016-02-23 LAB — BRAIN NATRIURETIC PEPTIDE: B NATRIURETIC PEPTIDE 5: 61.5 pg/mL (ref 0.0–100.0)

## 2016-02-23 LAB — HIV ANTIBODY (ROUTINE TESTING W REFLEX): HIV SCREEN 4TH GENERATION: NONREACTIVE

## 2016-02-23 MED ORDER — ENOXAPARIN SODIUM 40 MG/0.4ML ~~LOC~~ SOLN
40.0000 mg | Freq: Every day | SUBCUTANEOUS | Status: DC
  Administered 2016-02-25: 40 mg via SUBCUTANEOUS
  Filled 2016-02-23 (×2): qty 0.4

## 2016-02-23 MED ORDER — INSULIN DETEMIR 100 UNIT/ML ~~LOC~~ SOLN
10.0000 [IU] | Freq: Every day | SUBCUTANEOUS | Status: DC
  Administered 2016-02-23 – 2016-02-24 (×2): 10 [IU] via SUBCUTANEOUS
  Filled 2016-02-23 (×3): qty 0.1

## 2016-02-23 MED ORDER — LORATADINE 10 MG PO TABS
10.0000 mg | ORAL_TABLET | Freq: Every day | ORAL | Status: DC
  Administered 2016-02-23 – 2016-02-25 (×3): 10 mg via ORAL
  Filled 2016-02-23 (×3): qty 1

## 2016-02-23 MED ORDER — AZITHROMYCIN 250 MG PO TABS
500.0000 mg | ORAL_TABLET | Freq: Every day | ORAL | Status: AC
  Administered 2016-02-23: 500 mg via ORAL
  Filled 2016-02-23: qty 2

## 2016-02-23 MED ORDER — PANTOPRAZOLE SODIUM 40 MG PO TBEC
40.0000 mg | DELAYED_RELEASE_TABLET | Freq: Two times a day (BID) | ORAL | Status: DC
  Administered 2016-02-23 – 2016-02-25 (×5): 40 mg via ORAL
  Filled 2016-02-23 (×5): qty 1

## 2016-02-23 MED ORDER — ASPIRIN EC 81 MG PO TBEC
81.0000 mg | DELAYED_RELEASE_TABLET | Freq: Every day | ORAL | Status: DC
  Administered 2016-02-23 – 2016-02-25 (×3): 81 mg via ORAL
  Filled 2016-02-23 (×3): qty 1

## 2016-02-23 MED ORDER — ALPRAZOLAM 0.25 MG PO TABS: 0.2500 mg | ORAL_TABLET | Freq: Every evening | ORAL | Status: DC | PRN

## 2016-02-23 MED ORDER — ZOLPIDEM TARTRATE 5 MG PO TABS: 5.0000 mg | ORAL_TABLET | Freq: Every evening | ORAL | Status: DC | PRN

## 2016-02-23 MED ORDER — ACETAMINOPHEN 500 MG PO TABS
500.0000 mg | ORAL_TABLET | Freq: Four times a day (QID) | ORAL | Status: DC | PRN
  Administered 2016-02-23: 500 mg via ORAL
  Filled 2016-02-23: qty 1

## 2016-02-23 MED ORDER — INSULIN ASPART 100 UNIT/ML ~~LOC~~ SOLN
0.0000 [IU] | Freq: Three times a day (TID) | SUBCUTANEOUS | Status: DC
  Administered 2016-02-23: 3 [IU] via SUBCUTANEOUS
  Administered 2016-02-23: 5 [IU] via SUBCUTANEOUS
  Administered 2016-02-23: 7 [IU] via SUBCUTANEOUS
  Administered 2016-02-24: 2 [IU] via SUBCUTANEOUS
  Administered 2016-02-24 (×2): 3 [IU] via SUBCUTANEOUS
  Administered 2016-02-25: 5 [IU] via SUBCUTANEOUS

## 2016-02-23 MED ORDER — BUSPIRONE HCL 15 MG PO TABS
7.5000 mg | ORAL_TABLET | Freq: Two times a day (BID) | ORAL | Status: DC
  Administered 2016-02-23 – 2016-02-25 (×5): 7.5 mg via ORAL
  Filled 2016-02-23 (×4): qty 2
  Filled 2016-02-23: qty 1
  Filled 2016-02-23: qty 2
  Filled 2016-02-23: qty 1
  Filled 2016-02-23: qty 2
  Filled 2016-02-23 (×3): qty 1

## 2016-02-23 MED ORDER — IPRATROPIUM-ALBUTEROL 0.5-2.5 (3) MG/3ML IN SOLN
3.0000 mL | Freq: Three times a day (TID) | RESPIRATORY_TRACT | Status: DC
  Administered 2016-02-23 – 2016-02-25 (×7): 3 mL via RESPIRATORY_TRACT
  Filled 2016-02-23 (×7): qty 3

## 2016-02-23 MED ORDER — DILTIAZEM HCL ER COATED BEADS 120 MG PO CP24
240.0000 mg | ORAL_CAPSULE | Freq: Every day | ORAL | Status: DC
  Administered 2016-02-23 – 2016-02-25 (×3): 240 mg via ORAL
  Filled 2016-02-23 (×3): qty 2

## 2016-02-23 MED ORDER — AZITHROMYCIN 250 MG PO TABS
250.0000 mg | ORAL_TABLET | Freq: Every day | ORAL | Status: DC
  Administered 2016-02-24 – 2016-02-25 (×2): 250 mg via ORAL
  Filled 2016-02-23 (×2): qty 1

## 2016-02-23 MED ORDER — FUROSEMIDE 20 MG PO TABS
40.0000 mg | ORAL_TABLET | Freq: Every day | ORAL | Status: DC
  Administered 2016-02-23 – 2016-02-25 (×3): 40 mg via ORAL
  Filled 2016-02-23 (×3): qty 2

## 2016-02-23 MED ORDER — NITROGLYCERIN 0.4 MG SL SUBL: 0.4000 mg | SUBLINGUAL_TABLET | SUBLINGUAL | Status: DC | PRN

## 2016-02-23 MED ORDER — IPRATROPIUM-ALBUTEROL 0.5-2.5 (3) MG/3ML IN SOLN
3.0000 mL | RESPIRATORY_TRACT | Status: DC
  Administered 2016-02-23: 3 mL via RESPIRATORY_TRACT
  Filled 2016-02-23: qty 3

## 2016-02-23 MED ORDER — ATORVASTATIN CALCIUM 40 MG PO TABS
40.0000 mg | ORAL_TABLET | Freq: Every day | ORAL | Status: DC
  Administered 2016-02-23 – 2016-02-25 (×3): 40 mg via ORAL
  Filled 2016-02-23 (×3): qty 1

## 2016-02-23 MED ORDER — MORPHINE SULFATE (PF) 4 MG/ML IV SOLN: 2.0000 mg | INTRAVENOUS | Status: DC | PRN

## 2016-02-23 MED ORDER — POTASSIUM CHLORIDE CRYS ER 20 MEQ PO TBCR
20.0000 meq | EXTENDED_RELEASE_TABLET | Freq: Every day | ORAL | Status: DC
  Administered 2016-02-23 – 2016-02-25 (×3): 20 meq via ORAL
  Filled 2016-02-23 (×3): qty 1

## 2016-02-23 MED ORDER — ALBUTEROL SULFATE (2.5 MG/3ML) 0.083% IN NEBU
2.5000 mg | INHALATION_SOLUTION | RESPIRATORY_TRACT | Status: DC | PRN
  Administered 2016-02-25: 2.5 mg via RESPIRATORY_TRACT
  Filled 2016-02-23: qty 3

## 2016-02-23 MED ORDER — METHYLPREDNISOLONE SODIUM SUCC 125 MG IJ SOLR
60.0000 mg | Freq: Two times a day (BID) | INTRAMUSCULAR | Status: DC
  Administered 2016-02-23 – 2016-02-24 (×4): 60 mg via INTRAVENOUS
  Filled 2016-02-23 (×4): qty 2

## 2016-02-23 MED ORDER — DM-GUAIFENESIN ER 30-600 MG PO TB12
1.0000 | ORAL_TABLET | Freq: Two times a day (BID) | ORAL | Status: DC
  Administered 2016-02-23 – 2016-02-25 (×5): 1 via ORAL
  Filled 2016-02-23 (×5): qty 1

## 2016-02-23 MED ORDER — INSULIN DETEMIR 100 UNIT/ML ~~LOC~~ SOLN
2.0000 [IU] | Freq: Every day | SUBCUTANEOUS | Status: DC
  Filled 2016-02-23: qty 0.02

## 2016-02-23 MED ORDER — CALCIUM CARBONATE-VITAMIN D 500-200 MG-UNIT PO TABS
1.0000 | ORAL_TABLET | Freq: Every day | ORAL | Status: DC
Start: 2016-02-23 — End: 2016-02-25
  Administered 2016-02-23 – 2016-02-25 (×3): 1 via ORAL
  Filled 2016-02-23 (×3): qty 1

## 2016-02-23 MED ORDER — BACID PO TABS
2.0000 | ORAL_TABLET | Freq: Two times a day (BID) | ORAL | Status: DC
  Administered 2016-02-23 – 2016-02-25 (×5): 2 via ORAL
  Filled 2016-02-23 (×5): qty 2

## 2016-02-23 NOTE — H&P (Signed)
History and Physical    [name] [id number] DOB: Nov 27, 1940 DOA: 02/22/2016  Referring MD/NP/PA:   PCP: Reubin Milan, MD   Patient coming from:  The patient is coming from home.  At baseline, pt is independent for most of ADL.   Chief Complaint: Shortness of breath, productive cough, chest pain  HPI: [name] is a 75 y.o. male with medical history significant of hypertension, hyperlipidemia, diabetes mellitus, CAD, s/p of CABG, COPD on 2 L oxygen at home, GERD, depression, anxiety, PAF (not on anticoagulants possibly due to history of GI bleeding), dCHF, former alcohol abuser, who presents with shortness of breath, productive cough and CP.  Patient states that his symptoms started last night. He coughs up greenish colored sputum with SOB. No runny nose or sore throat. No fever or chills. He also has chest pain, which is located in the front chest, constant, dull, nonradiating, 7 out of 10 in severity. He was treated with aspirin and nitroglycerin by EMS, his chest pain is resolved. Currently chest pain-free. He continues to have short stress and cough. Patient does not have nausea, vomiting, abdominal pain, diarrhea, symptoms of UTI or unilateral weakness.   ED Course: pt was found to have WBC 8.5, negative troponin, INR 1.10, nitrites renal function okay, temperature normal, oxygen saturation 89% on right, tachypnea. Chest x-ray showed pulmonary fibrosis without infiltration. Patient is admitted to telemetry bed as inpatient.   Review of Systems:   General: no fevers, chills, no changes in body weight, has poor appetite, has fatigue HEENT: no blurry vision, hearing changes or sore throat Respiratory: has dyspnea, coughing, wheezing CV: has chest pain, no palpitations GI: no nausea, vomiting, abdominal pain, diarrhea, constipation GU: no dysuria, burning on urination, increased urinary frequency, hematuria  Ext: no leg edema Neuro: no unilateral weakness, numbness, or  tingling, no vision change or hearing loss Skin: no rash, no skin tear. MSK: No muscle spasm, no deformity, no limitation of range of movement in spin Heme: No easy bruising.  Travel history: No recent long distant travel.  Allergy:  Allergies  Allergen Reactions  . Xarelto [Rivaroxaban] Other (See Comments)    makes pt crazy  . Ticlopidine Hcl Swelling  . Lorazepam     Altered mental status from 2 doses of IV Ativan on 05/10/14.    Past Medical History:  Diagnosis Date  . Alcohol abuse   . Allergic rhinitis   . ANEMIA, B12 DEFICIENCY 12/02/2008  . Arthritis   . CAD (coronary artery disease)    a.  s/p CABG 2011.  Marland Kitchen CAROTID ARTERY STENOSIS 04/16/2009  . Cataract   . Chronic diastolic CHF (congestive heart failure) ([place])   . Chronic respiratory failure ([place])   . COLONIC POLYPS 05/21/2007  . COPD, severe ([place])   . Diabetes mellitus without complication ([place])   . DIVERTICULAR DISEASE 05/21/2007  . Essential hypertension   . Falls   . GERD (gastroesophageal reflux disease)   . GI BLEEDING 09/09/2008  . Hemoptysis   . HIATAL HERNIA 05/21/2007  . History of kidney stones   . HYPERLIPIDEMIA 11/21/2006  . Internal hemorrhoids   . MYOCARDIAL INFARCTION, HX OF 11/21/2006  . NEPHROLITHIASIS 05/21/2007  . Normocytic anemia   . NSVT (nonsustained ventricular tachycardia) ([place])    a. Holter 2015: NSR, A. Fib/flutter, short runs of NSVT  . OSTEOPOROSIS 11/21/2006  . Osteoporosis   . Paroxysmal atrial fibrillation ([place])    a. Anticoag stopped due to falls, rectal bleeding.  Marland Kitchen  Paroxysmal atrial flutter ([place])   . Pneumonia   . Pulmonary nodule    a. Dx 01/2015 -> advised to f/u PCP for PET.  Marland Kitchen PVD (peripheral vascular disease) ([place])    a. s/p prior iliac stenting.    Past Surgical History:  Procedure Laterality Date  . APPENDECTOMY    . CARDIOVERSION N/A 04/16/2014   Procedure: CARDIOVERSION;  Surgeon: Josue Hector, MD;  Location: [facility] ENDOSCOPY;  Service: Cardiovascular;  Laterality:  N/A;  . CHOLECYSTECTOMY    . CORONARY ARTERY BYPASS GRAFT  2011  . CORONARY STENT PLACEMENT     03/07/2001  . HEMORRHOID SURGERY    . INGUINAL HERNIA REPAIR  10/10/08  . MASS EXCISION Left 07/09/2014   Procedure: LEFT ELBOW BURSECTOMY EXCISION MASS;  Surgeon: Iran Planas, MD;  Location: [facility];  Service: Orthopedics;  Laterality: Left;  . OLECRANON BURSECTOMY  07/09/2014  . PTCA    . THYROIDECTOMY     partial    Social History:  reports that he quit smoking about 6 years ago. His smoking use included Cigarettes. He has a 150.00 pack-year smoking history. He has never used smokeless tobacco. He reports that he drinks about 3.0 oz of alcohol per week . He reports that he does not use drugs.  Family History:  Family History  Problem Relation Age of Onset  . Arthritis Mother     rheumatoid  . Heart attack Father   . Heart disease Father   . Heart disease Brother   . Colon cancer Brother 50  . Heart disease Brother   . Stomach cancer Neg Hx      Prior to Admission medications   Medication Sig Start Date End Date Taking? Authorizing Provider  acetaminophen (TYLENOL) 500 MG tablet Take 500 mg by mouth every 6 (six) hours as needed for mild pain or moderate pain.    Historical Provider, MD  albuterol (PROVENTIL) (2.5 MG/3ML) 0.083% nebulizer solution Take 2.5 mg by nebulization every 6 (six) hours as needed for wheezing or shortness of breath.    Historical Provider, MD  alendronate (FOSAMAX) 70 MG tablet Take 70 mg by mouth once a week. Take with a full glass of water on an empty stomach.    Historical Provider, MD  ALPRAZolam Duanne Moron) 0.25 MG tablet Take 0.25 mg by mouth at bedtime as needed for anxiety or sleep.    Historical Provider, MD  atorvastatin (LIPITOR) 40 MG tablet Take 40 mg by mouth daily.    Historical Provider, MD  budesonide-formoterol (SYMBICORT) 160-4.5 MCG/ACT inhaler Inhale 2 puffs into the lungs 2 (two) times daily.    Historical Provider, MD  busPIRone (BUSPAR) 7.5  MG tablet Take 7.5 mg by mouth 2 (two) times daily.    Historical Provider, MD  Calcium Carbonate-Vit D-Min (CALTRATE 600+D PLUS) 600-400 MG-UNIT per tablet Chew 1 tablet by mouth daily.      Historical Provider, MD  cetirizine (ZYRTEC ALLERGY) 10 MG tablet Take 1 tablet (10 mg total) by mouth every morning. 08/17/10   Tammy S Parrett, NP  diltiazem (CARDIZEM CD) 240 MG 24 hr capsule Take 1 capsule (240 mg total) by mouth daily. 01/21/16   Sherren Mocha, MD  furosemide (LASIX) 40 MG tablet TAKE 1 TABLET BY MOUTH EVERY DAY 10/20/14   Burtis Junes, NP  insulin detemir (LEVEMIR) 100 UNIT/ML injection Inject 3 Units into the skin at bedtime.     Historical Provider, MD  insulin lispro (HUMALOG) 100 UNIT/ML injection Inject 3 Units  into the skin 3 (three) times daily before meals.     Historical Provider, MD  lactobacillus acidophilus (BACID) TABS tablet Take 2 tablets by mouth 2 (two) times daily.    Historical Provider, MD  OXYGEN Inhale 2 L into the lungs continuous.    Historical Provider, MD  pantoprazole (PROTONIX) 40 MG tablet TAKE 1 TABLET BY MOUTH TWICE A DAY 05/19/14   Marin Olp, MD  potassium chloride SA (K-DUR,KLOR-CON) 20 MEQ tablet Take 1 tablet (20 mEq total) by mouth daily. 03/17/14   Burtis Junes, NP  SPIRIVA HANDIHALER 18 MCG inhalation capsule INHALE CONTENTS OF 1 CAPSULE DAILY 01/24/14   Lisabeth Pick, MD    Physical Exam: Vitals:   02/22/16 2330 02/22/16 2334 02/22/16 2345 02/23/16 0025  BP: 141/74  128/65 121/66  Pulse: 92  83 72  Resp: (!) 31  24 20   Temp: 97.5 F (36.4 C)     TempSrc: Oral     SpO2: (!) 89%  94% 92%  Weight:  68.9 kg (152 lb)    Height:  5\' 2"  (1.575 m)     General: Not in acute distress HEENT:       Eyes: PERRL, EOMI, no scleral icterus.       ENT: No discharge from the ears and nose, no pharynx injection, no tonsillar enlargement.        Neck: No JVD, no bruit, no mass felt. Heme: No neck lymph node enlargement. Cardiac: S1/S2, RRR,  No murmurs, No gallops or rubs. Respiratory: decreased air movement bilaterally. Has wheezing bilaterally. No rales or rubs. GI: Soft, nondistended, nontender, no rebound pain, no organomegaly, BS present. GU: No hematuria Ext: No pitting leg edema bilaterally. 2+DP/PT pulse bilaterally. Musculoskeletal: No joint deformities, No joint redness or warmth, no limitation of ROM in spin. Skin: No rashes.  Neuro: Alert, oriented X3, cranial nerves II-XII grossly intact, moves all extremities normally. Psych: Patient is not psychotic, no suicidal or hemocidal ideation.  Labs on Admission: I have personally reviewed following labs and imaging studies  CBC:  Recent Labs Lab 02/22/16 2324 02/22/16 2329  WBC 8.5  --   NEUTROABS 4.7  --   HGB 14.3 14.6  HCT 43.5 43.0  MCV 97.8  --   PLT 150  --    Basic Metabolic Panel:  Recent Labs Lab 02/22/16 2324 02/22/16 2329  NA 139 143  K 3.9 3.8  CL 105 101  CO2 26  --   GLUCOSE 187* 183*  BUN 17 19  CREATININE 1.12 1.10  CALCIUM 8.0*  --    GFR: Estimated Creatinine Clearance: 49.5 mL/min (by C-G formula based on SCr of 1.1 mg/dL). Liver Function Tests:  Recent Labs Lab 02/22/16 2324  AST 19  ALT 15*  ALKPHOS 51  BILITOT 0.4  PROT 5.3*  ALBUMIN 3.1*   No results for input(s): LIPASE, AMYLASE in the last 168 hours. No results for input(s): AMMONIA in the last 168 hours. Coagulation Profile:  Recent Labs Lab 02/22/16 2324  INR 1.10   Cardiac Enzymes:  Recent Labs Lab 02/22/16 2324  TROPONINI <0.03   BNP (last 3 results) No results for input(s): PROBNP in the last 8760 hours. HbA1C: No results for input(s): HGBA1C in the last 72 hours. CBG: No results for input(s): GLUCAP in the last 168 hours. Lipid Profile:  Recent Labs  02/22/16 2334  CHOL 95  HDL 29*  LDLCALC 47  TRIG 96  CHOLHDL 3.3   Thyroid Function  Tests: No results for input(s): TSH, T4TOTAL, FREET4, T3FREE, THYROIDAB in the last 72  hours. Anemia Panel: No results for input(s): VITAMINB12, FOLATE, FERRITIN, TIBC, IRON, RETICCTPCT in the last 72 hours. Urine analysis:    Component Value Date/Time   COLORURINE YELLOW 02/14/2015 1133   APPEARANCEUR CLEAR 02/14/2015 1133   LABSPEC 1.015 02/14/2015 1133   PHURINE 6.0 02/14/2015 1133   GLUCOSEU NEGATIVE 02/14/2015 1133   HGBUR NEGATIVE 02/14/2015 1133   HGBUR negative 02/04/2008 [place] 02/14/2015 1133   KETONESUR NEGATIVE 02/14/2015 1133   PROTEINUR NEGATIVE 02/14/2015 1133   UROBILINOGEN 1.0 08/23/2014 1712   NITRITE NEGATIVE 02/14/2015 1133   LEUKOCYTESUR NEGATIVE 02/14/2015 1133   Sepsis Labs: @LABRCNTIP (procalcitonin:4,lacticidven:4) )No results found for this or any previous visit (from the past 240 hour(s)).   Radiological Exams on Admission: Dg Chest Port 1 View  Result Date: 02/23/2016 CLINICAL DATA:  Chest pain and shortness of breath tonight. EXAM: PORTABLE CHEST 1 VIEW COMPARISON:  02/18/2015 FINDINGS: Postoperative changes in the mediastinum. Shallow inspiration. Heart size and pulmonary vascularity are normal. Diffuse interstitial fibrosis throughout the lungs. No focal airspace disease or consolidation. No blunting of costophrenic angles. No pneumothorax. Calcified and tortuous aorta. Surgical clips in the base of the neck. IMPRESSION: Shallow inspiration. Diffuse pulmonary fibrosis. No evidence of active pulmonary disease. Electronically Signed   By: Lucienne Capers M.D.   On: 02/23/2016 00:07     EKG: Independently reviewed.  QTC 497, bigeminy, ST depression in V4-V6    Assessment/Plan Principal Problem:   Acute on chronic respiratory failure with hypoxia (HCC) Active Problems:   Hyperlipidemia   Essential hypertension   Hx of CABG   Atrial fibrillation (HCC)   GERD (gastroesophageal reflux disease)   COPD exacerbation (HCC)   Diastolic CHF (HCC)   Diabetes mellitus type II, controlled ([place])   CAD (coronary artery  disease)   Chest pain   Acute on chronic respiratory failure with hypoxia due to COPD exacerbation: Patient has productive cough and wheezing bilaterally, consistent with COPD exacerbation. No infiltration on chest x-ray. Patient does not have leg edema or DVT, does not seem to have CHF exacerbation.  -will admit patient to telemetry bed  -Nebulizers: scheduled Duoneb and prn albuterol -Solu-Medrol 60 mg IV bid  -Oral azithromycin for 5 days.  -Mucinex for cough  -Urine S. pneumococcal antigen -Follow up blood culture x2, sputum culture, respiratory virus panel, Flu pcr  Chest pain and hx of CAD: s/p of Patient eighth stent placement. Patient chest pain has resolved. Initial troponin negative. EKG showed bigeminy and ST depression in V4-V6. Cardiology, dr. Jeannine Boga was consulted, reviewed EKG, did not think patient has STEMi. - cycle CE q6 x3 and repeat her EKG in the am  - Nitroglycerin, Morphine, and aspirin, lipitor  - Risk factor stratification: will check FLP and A1C  - 2d echo  HLD: Last LDL was 47 on 02/22/16 -Continue home medications: Lipitor  HTN: -continue lasix, Cardizem  Atrial Fibrillation: CHA2DS2-VASc Score is 6, needs oral anticoagulation, but pt is not on AC at home possibly due to history of GI bleeding. HR is well controlled -Continue Cardizem  GERD: -Protonix  DM-II: Last A1c 7.5, not well controled. Patient is taking Levemir and Humalog at home -will decrease Levemir dose from 3 to 2 units daily  -SSI  Chronic diastolic congestive heart failure: 2-D echo on 02/21/14 showed EF 55-60%. Patient does not have leg edema or JVD. CHF is compensated. -Continue home Lasix -check  BNP   DVT ppx: Q Lovenox Code Status: DNR Family Communication: None at bed side.   Disposition Plan:  Anticipate discharge back to previous home environment Consults called:  Card, dr. Jeannine Boga Admission status: Inpatient/tele   Date of Service 02/23/2016    Ivor Costa [facility] Pager [phone]  If 7PM-7AM, please contact night-coverage [email or web address] Password [email or web address] 02/23/2016, 1:03 AM

## 2016-02-23 NOTE — Progress Notes (Signed)
PROGRESS NOTE    [name]  [id number] DOB: 1941-02-17 DOA: 02/22/2016 PCP: Reubin Milan, MD   Brief Narrative:  75 yo male with copd and chronic hypoxic respiratory failure on  Home 02 presents with worsening dyspnea and increased mucus production for 24 hours. Symptoms associated with atypical chest pain. On initial physical examination with oxygen saturation down to 89% with signs of respiratory distress. Admitted with copd exacerbation, placed on systemic steroids and bronchodilator therapy.    Assessment & Plan:   Principal Problem:   Acute on chronic respiratory failure with hypoxia (HCC) Active Problems:   Hyperlipidemia   Essential hypertension   Hx of CABG   Atrial fibrillation (HCC)   GERD (gastroesophageal reflux disease)   COPD exacerbation (HCC)   Diastolic CHF ([place])   Diabetes mellitus type II, controlled ([place])   CAD (coronary artery disease)   Chest pain  1. COPD exacerbation with acute on chronic respiratory failure. Continue azithromycin, guaifenesin, bronchodilator therapy with albuterol and ipratropium. Systemic steroids with methylprednisolone, will decrease dose to 40 mg bid. Continue oxymetry monitoring and supplemental 0-2 per [place] to target 02 sat above 92%. Antibiotic therapy with azithromycin. Chest film personally reviewed note signs of air trapping but no infiltrates.   2. CAD with chest pain. No further chest pain, cardiac enzymes negative and ekg no signs of ischemia personally reviewed. Will continue asa, atorvastatin,   3. Atrial fibrillation. Will continue rate control with diltiazem and anticoagulation with asa. EKG personally reviewed noted atrial fibrillation with pvc.   4, T2DM. Will continue glucose cover and monitoring with sliding scale, will increase levimir to 10 units qhs, capillary glcuose  214-320-251, patient tolerating po well.   5. Diastolic heart failure. Patient clinically euvolemic, will continue furosemide per home regimen.  Blood pressure [id number] to 123456 systolic. Echocardiogram from 12 (old records personally reviewed) noted normal systolic LV function.    DVT prophylaxis: enoxaparin Code Status: dnr Family Communication: no family at the bedside Disposition Plan: home   Consultants:     Procedures:   Antimicrobials:    Subjective: Patient with persistent dyspnea, no nausea or vomiting, no chest pain. Dyspnea moderate in intensity and worse with exertion, at home on 2 LPM and 4 LPM when sleeping or moving.   Objective: Vitals:   02/23/16 0824 02/23/16 1051 02/23/16 1300 02/23/16 1503  BP:  122/67 124/69   Pulse:   73   Resp:   15   Temp:   98.2 F (36.8 C)   TempSrc:   Oral   SpO2: 97%  98% 98%  Weight:      Height:        Intake/Output Summary (Last 24 hours) at 02/23/16 1552 Last data filed at 02/23/16 1429  Gross per 24 hour  Intake              240 ml  Output              400 ml  Net             -160 ml   Filed Weights   02/22/16 2334 02/23/16 0524  Weight: 68.9 kg (152 lb) 70.1 kg (154 lb 9.6 oz)    Examination:  General exam: deconditioned with mid dyspnea E ENT. Pallor but no icterus, oral mucosa dry Respiratory system: decreased air movement with scattered rales, no wheezing or rhonchi. Cardiovascular system: S1 & S2 heard, RRR. No JVD, murmurs, rubs, gallops or clicks. trace edema. Gastrointestinal system: Abdomen is  nondistended, soft and nontender. No organomegaly or masses felt. Normal bowel sounds heard. Central nervous system: Alert and oriented. No focal neurological deficits. Extremities: Symmetric 5 x 5 power. Skin: No rashes, lesions or ulcers    Data Reviewed: I have personally reviewed following labs and imaging studies  CBC:  Recent Labs Lab 02/22/16 2324 02/22/16 2329  WBC 8.5  --   NEUTROABS 4.7  --   HGB 14.3 14.6  HCT 43.5 43.0  MCV 97.8  --   PLT 150  --    Basic Metabolic Panel:  Recent Labs Lab 02/22/16 2324 02/22/16 2329  NA 139  143  K 3.9 3.8  CL 105 101  CO2 26  --   GLUCOSE 187* 183*  BUN 17 19  CREATININE 1.12 1.10  CALCIUM 8.0*  --    GFR: Estimated Creatinine Clearance: 49.9 mL/min (by C-G formula based on SCr of 1.1 mg/dL). Liver Function Tests:  Recent Labs Lab 02/22/16 2324  AST 19  ALT 15*  ALKPHOS 51  BILITOT 0.4  PROT 5.3*  ALBUMIN 3.1*   No results for input(s): LIPASE, AMYLASE in the last 168 hours. No results for input(s): AMMONIA in the last 168 hours. Coagulation Profile:  Recent Labs Lab 02/22/16 2324  INR 1.10   Cardiac Enzymes:  Recent Labs Lab 02/22/16 2324 02/23/16 0048 02/23/16 0717 02/23/16 1238  TROPONINI <0.03 <0.03 <0.03 <0.03   BNP (last 3 results) No results for input(s): PROBNP in the last 8760 hours. HbA1C: No results for input(s): HGBA1C in the last 72 hours. CBG:  Recent Labs Lab 02/23/16 0535 02/23/16 0800 02/23/16 1137  GLUCAP 214* 320* 251*   Lipid Profile:  Recent Labs  02/22/16 2334  CHOL 95  HDL 29*  LDLCALC 47  TRIG 96  CHOLHDL 3.3   Thyroid Function Tests: No results for input(s): TSH, T4TOTAL, FREET4, T3FREE, THYROIDAB in the last 72 hours. Anemia Panel: No results for input(s): VITAMINB12, FOLATE, FERRITIN, TIBC, IRON, RETICCTPCT in the last 72 hours. Sepsis Labs: No results for input(s): PROCALCITON, LATICACIDVEN in the last 168 hours.  No results found for this or any previous visit (from the past 240 hour(s)).       Radiology Studies: Dg Chest Port 1 View  Result Date: 02/23/2016 CLINICAL DATA:  Chest pain and shortness of breath tonight. EXAM: PORTABLE CHEST 1 VIEW COMPARISON:  02/18/2015 FINDINGS: Postoperative changes in the mediastinum. Shallow inspiration. Heart size and pulmonary vascularity are normal. Diffuse interstitial fibrosis throughout the lungs. No focal airspace disease or consolidation. No blunting of costophrenic angles. No pneumothorax. Calcified and tortuous aorta. Surgical clips in the base of  the neck. IMPRESSION: Shallow inspiration. Diffuse pulmonary fibrosis. No evidence of active pulmonary disease. Electronically Signed   By: Lucienne Capers M.D.   On: 02/23/2016 00:07        Scheduled Meds: . aspirin EC  81 mg Oral Daily  . atorvastatin  40 mg Oral Daily  . [START ON 02/24/2016] azithromycin  250 mg Oral Daily  . busPIRone  7.5 mg Oral BID  . calcium-vitamin D  1 tablet Oral Daily  . dextromethorphan-guaiFENesin  1 tablet Oral BID  . diltiazem  240 mg Oral Daily  . enoxaparin (LOVENOX) injection  40 mg Subcutaneous Daily  . furosemide  40 mg Oral Daily  . insulin aspart  0-9 Units Subcutaneous TID WC  . insulin detemir  2 Units Subcutaneous QHS  . ipratropium-albuterol  3 mL Nebulization TID  . lactobacillus acidophilus  2 tablet Oral BID  . loratadine  10 mg Oral Daily  . methylPREDNISolone sodium succinate  60 mg Intravenous Q12H  . pantoprazole  40 mg Oral BID  . potassium chloride SA  20 mEq Oral Daily   Continuous Infusions:   LOS: 0 days        Mauricio Gerome Apley, MD [facility] Pager [phone]  If 7PM-7AM, please contact night-coverage [email or web address] Password [email or web address] 02/23/2016, 3:52 PM

## 2016-02-23 NOTE — Progress Notes (Signed)
Nutrition Consult Brief Note  Received consult from the COPD Gold Protocol. Patient with no weight loss PTA. He had lost weight last year and weight is now on an upward trend. He has been eating well. Nutrition-Focused physical exam completed. Findings are no fat depletion, no muscle depletion, and no edema.   Body mass index is 28.28 kg/m. Patient meets criteria for overweight based on current BMI.   Current diet order is Heart healthy, patient is consuming approximately 50-75% of meals at this time. Labs and medications reviewed.   No nutrition interventions warranted at this time. If nutrition issues arise, please consult RD.   Molli Barrows, RD, LDN, [place] Pager [phone] After Hours Pager [phone]

## 2016-02-23 NOTE — Progress Notes (Signed)
Inpatient Diabetes Program Recommendations  AACE/ADA: New Consensus Statement on Inpatient Glycemic Control (2015)  Target Ranges:  Prepandial:   less than 140 mg/dL      Peak postprandial:   less than 180 mg/dL (1-2 hours)      Critically ill patients:  140 - 180 mg/dL   Lab Results  Component Value Date   GLUCAP 320 (H) 02/23/2016   HGBA1C 7.5 (H) 08/24/2014    Review of Glycemic Control  Results for [name], [name] (MRN [id number]) as of 02/23/2016 08:25  Ref. Range 02/23/2016 05:35 02/23/2016 08:00  Glucose-Capillary Latest Ref Range: 65 - 99 mg/dL 214 (H) 320 (H)    Diabetes history: Type 2, A1C pending Outpatient Diabetes medications: Lantus 2units qday, Humalog 3 units tid Current orders for Inpatient glycemic control: Levemir 2units qday, Novolog 0-9 units tid * steroids 60mg  q12h  Inpatient Diabetes Program Recommendations: Please consider increasing Novolog correction to moderate correction while the patient is on steroids.  Gentry Fitz, RN, BA, MHA, CDE Diabetes Coordinator Inpatient Diabetes Program  [phone] (Team Pager) [phone] ([place]) 02/23/2016 8:36 AM

## 2016-02-23 NOTE — Evaluation (Signed)
Physical Therapy Evaluation Patient Details Name: [name] MRN: [id number] DOB: [birth year] Today's Date: 02/23/2016   History of Present Illness  pt presents with respiratory failure and hypoxia.  pt wit hx of HTN, DM, CAD, CABG, COPD with home O2, Depression, Anxiety, CHF, Etoh, MI, PVD, and A-flutter.    Clinical Impression  Pt seems to be near baseline level of mobility per pt report.  Pt only ambulates short distances due to SOB at baseline.  Pt has support from his son at home and feel pt would be safe for return to home at this time.      Follow Up Recommendations No PT follow up;Supervision - Intermittent    Equipment Recommendations  None recommended by PT    Recommendations for Other Services       Precautions / Restrictions Precautions Precautions: None Restrictions Weight Bearing Restrictions: No      Mobility  Bed Mobility Overal bed mobility: Modified Independent             General bed mobility comments: pt with increased effort and increased SOB, but pt indicates that is normal for him.  No A or cueing needed.    Transfers Overall transfer level: Modified independent Equipment used: None             General transfer comment: pt again with increased SOB, but O2 sats remained mid 90s on 2L O2.    Ambulation/Gait Ambulation/Gait assistance: Supervision Ambulation Distance (Feet): 180 Feet Assistive device: None Gait Pattern/deviations: Step-through pattern     General Gait Details: pt moves slowly and with increased SOB, however O2 sats remained in 90s on 2L O2.  pt indicates this is near his baseline level of mobility.    Stairs            Wheelchair Mobility    Modified Rankin (Stroke Patients Only)       Balance Overall balance assessment: Modified Independent                                           Pertinent Vitals/Pain Pain Assessment: No/denies pain    Home Living Family/patient expects to be  discharged to:: Private residence Living Arrangements: Children (Son who is on disability) Available Help at Discharge: Family;Available 24 hours/day Type of Home: House Home Access:  (Ramp being built this Friday)     Home Layout: Two level;Able to live on main level with bedroom/bathroom Home Equipment: Walker - 4 wheels Additional Comments: Home O2- 2L during the day and 4L at night    Prior Function Level of Independence: Needs assistance   Gait / Transfers Assistance Needed: Only ambulates short distances and uses 4WW when out due to need for seat.    ADL's / Homemaking Assistance Needed: pt participates in homemaking tasks, but son performs heavier chores.        Hand Dominance        Extremity/Trunk Assessment   Upper Extremity Assessment: Defer to OT evaluation           Lower Extremity Assessment: Generalized weakness      Cervical / Trunk Assessment: Kyphotic  Communication   Communication: No difficulties  Cognition Arousal/Alertness: Awake/alert Behavior During Therapy: WFL for tasks assessed/performed Overall Cognitive Status: Within Functional Limits for tasks assessed  General Comments      Exercises     Assessment/Plan    PT Assessment Patient needs continued PT services  PT Problem List Decreased strength;Decreased activity tolerance;Decreased mobility;Cardiopulmonary status limiting activity          PT Treatment Interventions DME instruction;Gait training;Stair training;Functional mobility training;Therapeutic activities;Therapeutic exercise;Balance training;Patient/family education    PT Goals (Current goals can be found in the Care Plan section)  Acute Rehab PT Goals Patient Stated Goal: Home today PT Goal Formulation: With patient Time For Goal Achievement: 03/01/16 Potential to Achieve Goals: Good    Frequency Min 3X/week   Barriers to discharge        Co-evaluation               End  of Session Equipment Utilized During Treatment: Gait belt;Oxygen Activity Tolerance: Patient limited by fatigue Patient left: in chair;with call bell/phone within reach;with chair alarm set Nurse Communication: Mobility status         Time: [id number] PT Time Calculation (min) (ACUTE ONLY): 24 min   Charges:   PT Evaluation $PT Eval Moderate Complexity: 1 Procedure PT Treatments $Gait Training: 8-22 mins   PT G CodesCatarina Hartshorn, [place]  [phone] 02/23/2016, 9:44 AM

## 2016-02-24 ENCOUNTER — Other Ambulatory Visit (HOSPITAL_COMMUNITY): Payer: Medicare Other

## 2016-02-24 ENCOUNTER — Inpatient Hospital Stay (HOSPITAL_COMMUNITY): Payer: Medicare Other

## 2016-02-24 DIAGNOSIS — I10 Essential (primary) hypertension: Secondary | ICD-10-CM

## 2016-02-24 DIAGNOSIS — I2510 Atherosclerotic heart disease of native coronary artery without angina pectoris: Secondary | ICD-10-CM

## 2016-02-24 DIAGNOSIS — J441 Chronic obstructive pulmonary disease with (acute) exacerbation: Principal | ICD-10-CM

## 2016-02-24 DIAGNOSIS — R079 Chest pain, unspecified: Secondary | ICD-10-CM

## 2016-02-24 DIAGNOSIS — E118 Type 2 diabetes mellitus with unspecified complications: Secondary | ICD-10-CM

## 2016-02-24 LAB — ECHOCARDIOGRAM COMPLETE
HEIGHTINCHES: 62 in
WEIGHTICAEL: 2432 [oz_av]

## 2016-02-24 LAB — CBC WITH DIFFERENTIAL/PLATELET
BASOS ABS: 0 10*3/uL (ref 0.0–0.1)
Basophils Relative: 0 %
Eosinophils Absolute: 0 10*3/uL (ref 0.0–0.7)
Eosinophils Relative: 0 %
HEMATOCRIT: 44.6 % (ref 39.0–52.0)
HEMOGLOBIN: 14.8 g/dL (ref 13.0–17.0)
LYMPHS ABS: 0.6 10*3/uL — AB (ref 0.7–4.0)
LYMPHS PCT: 4 %
MCH: 32.2 pg (ref 26.0–34.0)
MCHC: 33.2 g/dL (ref 30.0–36.0)
MCV: 97.2 fL (ref 78.0–100.0)
Monocytes Absolute: 0.5 10*3/uL (ref 0.1–1.0)
Monocytes Relative: 3 %
NEUTROS ABS: 14.4 10*3/uL — AB (ref 1.7–7.7)
NEUTROS PCT: 93 %
PLATELETS: 174 10*3/uL (ref 150–400)
RBC: 4.59 MIL/uL (ref 4.22–5.81)
RDW: 15.2 % (ref 11.5–15.5)
WBC: 15.5 10*3/uL — AB (ref 4.0–10.5)

## 2016-02-24 LAB — GLUCOSE, CAPILLARY
Glucose-Capillary: 174 mg/dL — ABNORMAL HIGH (ref 65–99)
Glucose-Capillary: 249 mg/dL — ABNORMAL HIGH (ref 65–99)
Glucose-Capillary: 225 mg/dL — ABNORMAL HIGH (ref 65–99)

## 2016-02-24 LAB — BASIC METABOLIC PANEL
ANION GAP: 14 (ref 5–15)
BUN: 17 mg/dL (ref 6–20)
CHLORIDE: 101 mmol/L (ref 101–111)
CO2: 24 mmol/L (ref 22–32)
Calcium: 9.2 mg/dL (ref 8.9–10.3)
Creatinine, Ser: 0.91 mg/dL (ref 0.61–1.24)
GFR calc Af Amer: >60 mL/min (ref >60)
GLUCOSE: 185 mg/dL — AB (ref 65–99)
POTASSIUM: 4.4 mmol/L (ref 3.5–5.1)
Sodium: 139 mmol/L (ref 135–145)

## 2016-02-24 LAB — HEMOGLOBIN A1C
Hgb A1c MFr Bld: 7.2 % — ABNORMAL HIGH (ref 4.8–5.6)
Mean Plasma Glucose: 160 mg/dL

## 2016-02-24 MED ORDER — PREDNISONE 10 MG PO TABS: 10.0000 mg | ORAL_TABLET | Freq: Every day | ORAL | 0 refills | Status: DC

## 2016-02-24 MED ORDER — ASPIRIN 81 MG PO TBEC: 81.0000 mg | DELAYED_RELEASE_TABLET | Freq: Every day | ORAL | Status: DC

## 2016-02-24 MED ORDER — DM-GUAIFENESIN ER 30-600 MG PO TB12: 1.0000 | ORAL_TABLET | Freq: Two times a day (BID) | ORAL | 0 refills | Status: DC

## 2016-02-24 MED ORDER — AZITHROMYCIN 250 MG PO TABS: 250.0000 mg | ORAL_TABLET | Freq: Every day | ORAL | 0 refills | Status: DC

## 2016-02-24 NOTE — Progress Notes (Signed)
Inpatient Diabetes Program Recommendations  AACE/ADA: New Consensus Statement on Inpatient Glycemic Control (2015)  Target Ranges:  Prepandial:   less than 140 mg/dL      Peak postprandial:   less than 180 mg/dL (1-2 hours)      Critically ill patients:  140 - 180 mg/dL   Lab Results  Component Value Date   GLUCAP 249 (H) 02/24/2016   HGBA1C 7.2 (H) 02/23/2016    Review of Glycemic Control Results for [name], [name] (MRN [id number]) as of 02/24/2016 11:37  Ref. Range 02/23/2016 08:00 02/23/2016 11:37 02/23/2016 16:33 02/23/2016 21:58 02/24/2016 07:42 02/24/2016 11:26  Glucose-Capillary Latest Ref Range: 65 - 99 mg/dL 320 (H) 251 (H) 202 (H) 243 (H) 174 (H) 249 (H)   Diabetes history: Type 2, A1C pending Outpatient Diabetes medications: Lantus 2units qday, Humalog 3 units tid Current orders for Inpatient glycemic control: Levemir 10 units qday, Novolog 0-9 units tid * steroids 60mg  q12h  Inpatient Diabetes Program Recommendations:  Please consider adding Novolog 2-3 units tid meal coverage if eats 50%.  Thank you, Nani Gasser. Kaelee Pfeffer, RN, MSN, CDE Inpatient Glycemic Control Team Team Pager [phone] (8am-5pm) 02/24/2016 11:38 AM

## 2016-02-24 NOTE — Progress Notes (Signed)
PROGRESS NOTE    [name]  [id number] DOB: 07/10/1940 DOA: 02/22/2016 PCP: Reubin Milan, MD   Chief Complaint  Patient presents with  . Chest Pain  . Shortness of Breath    Brief Narrative:  [name] a 75 y.o.malewith medical history significant of hypertension, hyperlipidemia, diabetes mellitus, CAD, s/p of CABG, COPD on 2 L oxygen at home, GERD, depression, anxiety, PAF (not on anticoagulants possibly due to history of GI bleeding), dCHF, former alcohol abuser, whopresents with shortness of breath, productive cough and CP. Patient states that his symptoms started last night. He coughs up greenish colored sputum with SOB. No runny nose or sore throat. No fever or chills. He also has chest pain, which is located in the frontchest, constant, dull, nonradiating, 7 out of 10 in severity. He was treated with aspirin and nitroglycerin by EMS, his chest pain is resolved. Currently chest pain-free. He continues to have short stress and cough. Patient does not have nausea, vomiting, abdominal pain, diarrhea, symptoms of UTI or unilateral weakness.  Assessment & Plan  Acute on chronic respiratory failure with hypoxia due to COPD exacerbation -patient does use 2L of oxygen at home -Upon admission, patient did have productive cough and wheezing.  -CXR unremarkable for infection -Feels breathing is back to baseline -Continue nebs, azithromycin -Will discharge patient with prednisone taper -Respiratory viral panel and influenza negative -Blood cultures   Chest pain and history of CAD -s/p stent placement.  -Currently chest pain free -Patient states he recently fell and broke a few ribs and followed up with the [facility]  -Troponin cycled and unremarkable -Dr. Sallyanne Kuster reviewed EKG with admitting physician, did not feel EKG represented STEMI (Admission EKG showed ST depression V4-6) -Continue statin -was placed on aspirin during hospitalization. Should discuss continuation  with PCP given his history of GI bleed. -Echocardiogram pending   Leukocytosis -Likely secondary to steroids  Hyperlipidemia -Continue statin  Hypertension -continue lasix, Cardizem  Atrial Fibrillation -CHA2DS2-VASc Score 6 -Not on anticoagulation due to history of GIB -Continue Cardizem  GERD -Continue PPI  Diabetes mellitus, type II -Hemoglobin A1c 7.2 -Continue home regimen upon discharge  Chronic diastolic congestive heart failure  -Echocardiogram 02/21/14 showed EF 55-60%.  -Currently appears compensated and euvolemic -Continue home Lasix -BNP 61.5  DVT Prophylaxis  Lovenox  Code Status: DNR  Family Communication: none at bedside  Disposition Plan: Admitted  Procedures: Echocardiogram  Consultations: None  Antibiotics   Anti-infectives    Start     Dose/Rate Route Frequency Ordered Stop   02/24/16 1000  azithromycin (ZITHROMAX) tablet 250 mg     250 mg Oral Daily 02/23/16 0048 02/28/16 0959   02/24/16 0000  azithromycin (ZITHROMAX) 250 MG tablet     250 mg Oral Daily 02/24/16 1114     02/23/16 0130  azithromycin (ZITHROMAX) tablet 500 mg     500 mg Oral Daily 02/23/16 0048 02/23/16 0854      Subjective:   [name] seen and examined today.  Patient feels breathing has improved and back to his baseline. He denies further chest pain, headache, dizziness, abdominal pain, nausea,vomiting, diarrhea, constipation.  Would like to go home.   Objective:   Vitals:   02/24/16 0600 02/24/16 0900 02/24/16 0937 02/24/16 1058  BP:    101/86  Pulse:      Resp:      Temp:      TempSrc:      SpO2: 92% 95% 90%   Weight:  Height:        Intake/Output Summary (Last 24 hours) at 02/24/16 1741 Last data filed at 02/24/16 1359  Gross per 24 hour  Intake              820 ml  Output             1000 ml  Net             -180 ml   Filed Weights   02/22/16 2334 02/23/16 0524 02/24/16 0527  Weight: 68.9 kg (152 lb) 70.1 kg (154 lb 9.6 oz) 68.9 kg  (152 lb)    Exam  General: Well developed, well nourished, NAD, appears stated age  HEENT: NCAT, mucous membranes moist.  Cardiovascular: S1 S2 auscultated, irregular  Respiratory: Diminished but clear, mild wheezing.  Abdomen: Soft, nontender, nondistended, + bowel sounds  Extremities: warm dry without cyanosis clubbing or edema  Neuro: AAOx3, nonfocal  Psych: Normal affect and demeanor with intact judgement and insight   Data Reviewed: I have personally reviewed following labs and imaging studies  CBC:  Recent Labs Lab 02/22/16 2324 02/22/16 2329 02/24/16 0450  WBC 8.5  --  15.5*  NEUTROABS 4.7  --  14.4*  HGB 14.3 14.6 14.8  HCT 43.5 43.0 44.6  MCV 97.8  --  97.2  PLT 150  --  [id number]   Basic Metabolic Panel:  Recent Labs Lab 02/22/16 2324 02/22/16 2329 02/24/16 0450  NA 139 143 139  K 3.9 3.8 4.4  CL 105 101 101  CO2 26  --  24  GLUCOSE 187* 183* 185*  BUN 17 19 17   CREATININE 1.12 1.10 0.91  CALCIUM 8.0*  --  9.2   GFR: Estimated Creatinine Clearance: 59.8 mL/min (by C-G formula based on SCr of 0.91 mg/dL). Liver Function Tests:  Recent Labs Lab 02/22/16 2324  AST 19  ALT 15*  ALKPHOS 51  BILITOT 0.4  PROT 5.3*  ALBUMIN 3.1*   No results for input(s): LIPASE, AMYLASE in the last 168 hours. No results for input(s): AMMONIA in the last 168 hours. Coagulation Profile:  Recent Labs Lab 02/22/16 2324  INR 1.10   Cardiac Enzymes:  Recent Labs Lab 02/22/16 2324 02/23/16 0048 02/23/16 0717 02/23/16 1238  TROPONINI <0.03 <0.03 <0.03 <0.03   BNP (last 3 results) No results for input(s): PROBNP in the last 8760 hours. HbA1C:  Recent Labs  02/23/16 0158  HGBA1C 7.2*   CBG:  Recent Labs Lab 02/23/16 1633 02/23/16 2158 02/24/16 0742 02/24/16 1126 02/24/16 1607  GLUCAP 202* 243* 174* 249* 225*   Lipid Profile:  Recent Labs  02/22/16 2334  CHOL 95  HDL 29*  LDLCALC 47  TRIG 96  CHOLHDL 3.3   Thyroid Function  Tests: No results for input(s): TSH, T4TOTAL, FREET4, T3FREE, THYROIDAB in the last 72 hours. Anemia Panel: No results for input(s): VITAMINB12, FOLATE, FERRITIN, TIBC, IRON, RETICCTPCT in the last 72 hours. Urine analysis:    Component Value Date/Time   COLORURINE YELLOW 02/14/2015 1133   APPEARANCEUR CLEAR 02/14/2015 1133   LABSPEC 1.015 02/14/2015 1133   PHURINE 6.0 02/14/2015 1133   GLUCOSEU NEGATIVE 02/14/2015 1133   HGBUR NEGATIVE 02/14/2015 1133   HGBUR negative 02/04/2008 [place] 02/14/2015 1133   KETONESUR NEGATIVE 02/14/2015 1133   PROTEINUR NEGATIVE 02/14/2015 1133   UROBILINOGEN 1.0 08/23/2014 1712   NITRITE NEGATIVE 02/14/2015 1133   LEUKOCYTESUR NEGATIVE 02/14/2015 1133   Sepsis Labs: @LABRCNTIP (procalcitonin:4,lacticidven:4)  ) Recent Results (from  the past 240 hour(s))  Culture, blood (routine x 2) Call MD if unable to obtain prior to antibiotics being given     Status: None (Preliminary result)   Collection Time: 02/23/16  1:50 AM  Result Value Ref Range Status   Specimen Description BLOOD LEFT HAND  Final   Special Requests IN PEDIATRIC BOTTLE 4ML  Final   Culture NO GROWTH 1 DAY  Final   Report Status PENDING  Incomplete  Culture, blood (routine x 2) Call MD if unable to obtain prior to antibiotics being given     Status: None (Preliminary result)   Collection Time: 02/23/16  1:50 AM  Result Value Ref Range Status   Specimen Description BLOOD RIGHT HAND  Final   Special Requests BOTTLES DRAWN AEROBIC AND ANAEROBIC 10ML  Final   Culture NO GROWTH 1 DAY  Final   Report Status PENDING  Incomplete  Respiratory Panel by PCR     Status: None   Collection Time: 02/23/16  3:37 PM  Result Value Ref Range Status   Adenovirus NOT DETECTED NOT DETECTED Final   Coronavirus 229E NOT DETECTED NOT DETECTED Final   Coronavirus HKU1 NOT DETECTED NOT DETECTED Final   Coronavirus NL63 NOT DETECTED NOT DETECTED Final   Coronavirus OC43 NOT DETECTED NOT  DETECTED Final   Metapneumovirus NOT DETECTED NOT DETECTED Final   Rhinovirus / Enterovirus NOT DETECTED NOT DETECTED Final   Influenza A NOT DETECTED NOT DETECTED Final   Influenza B NOT DETECTED NOT DETECTED Final   Parainfluenza Virus 1 NOT DETECTED NOT DETECTED Final   Parainfluenza Virus 2 NOT DETECTED NOT DETECTED Final   Parainfluenza Virus 3 NOT DETECTED NOT DETECTED Final   Parainfluenza Virus 4 NOT DETECTED NOT DETECTED Final   Respiratory Syncytial Virus NOT DETECTED NOT DETECTED Final   Bordetella pertussis NOT DETECTED NOT DETECTED Final   Chlamydophila pneumoniae NOT DETECTED NOT DETECTED Final   Mycoplasma pneumoniae NOT DETECTED NOT DETECTED Final      Radiology Studies: Dg Chest Port 1 View  Result Date: 02/23/2016 CLINICAL DATA:  Chest pain and shortness of breath tonight. EXAM: PORTABLE CHEST 1 VIEW COMPARISON:  02/18/2015 FINDINGS: Postoperative changes in the mediastinum. Shallow inspiration. Heart size and pulmonary vascularity are normal. Diffuse interstitial fibrosis throughout the lungs. No focal airspace disease or consolidation. No blunting of costophrenic angles. No pneumothorax. Calcified and tortuous aorta. Surgical clips in the base of the neck. IMPRESSION: Shallow inspiration. Diffuse pulmonary fibrosis. No evidence of active pulmonary disease. Electronically Signed   By: Lucienne Capers M.D.   On: 02/23/2016 00:07     Scheduled Meds: . aspirin EC  81 mg Oral Daily  . atorvastatin  40 mg Oral Daily  . azithromycin  250 mg Oral Daily  . busPIRone  7.5 mg Oral BID  . calcium-vitamin D  1 tablet Oral Daily  . dextromethorphan-guaiFENesin  1 tablet Oral BID  . diltiazem  240 mg Oral Daily  . enoxaparin (LOVENOX) injection  40 mg Subcutaneous Daily  . furosemide  40 mg Oral Daily  . insulin aspart  0-9 Units Subcutaneous TID WC  . insulin detemir  10 Units Subcutaneous QHS  . ipratropium-albuterol  3 mL Nebulization TID  . lactobacillus acidophilus  2  tablet Oral BID  . loratadine  10 mg Oral Daily  . methylPREDNISolone sodium succinate  60 mg Intravenous Q12H  . pantoprazole  40 mg Oral BID  . potassium chloride SA  20 mEq Oral Daily   Continuous  Infusions:   LOS: 1 day   Time Spent in minutes   30 minutes  Ubaldo Daywalt D.O. on 02/24/2016 at 5:41 PM  Between 7am to 7pm - Pager - [phone]  After 7pm go to [email or web address] - password [email or web address]  And look for the night coverage person covering for me after hours  [facility] Office  [phone]

## 2016-02-24 NOTE — Evaluation (Signed)
Occupational Therapy Evaluation and Discharge Patient Details Name: [name] MRN: [id number] DOB: [birth year] Today's Date: 02/24/2016    History of Present Illness pt presents with respiratory failure and hypoxia.  pt wit hx of HTN, DM, CAD, CABG, COPD with home O2, Depression, Anxiety, CHF, Etoh, MI, PVD, and A-flutter.     Clinical Impression   Pt reports he was independent with ADL PTA. Currently pt overall supervision for ADL and functional mobility. Upon OT arrival pt standing in room with supplemental O2 disconnected from wall; pt with SOB and SpO2 90% on RA. Applied 2L O2 via nasal canula with rise in SpO2 to mid 90s. Educated pt on home safety, breathing and energy conservation strategies. Pt reports he currently feels at his functional baseline. Pt planning to d/c home with near 24/7 supervision from his son. No further acute OT needs identified; signing off at this time. Please re-consult if needs change. Thank you for this referral.    Follow Up Recommendations  No OT follow up;Supervision - Intermittent    Equipment Recommendations  None recommended by OT    Recommendations for Other Services       Precautions / Restrictions Precautions Precautions: Fall Restrictions Weight Bearing Restrictions: No      Mobility Bed Mobility               General bed mobility comments: Pt standing in room upon arrival; left sitting EOB.  Transfers Overall transfer level: Needs assistance Equipment used: None Transfers: Sit to/from Stand Sit to Stand: Supervision         General transfer comment: No unsteadiness or LOB noted.    Balance Overall balance assessment: No apparent balance deficits (not formally assessed)                                          ADL Overall ADL's : Needs assistance/impaired Eating/Feeding: Independent;Sitting   Grooming: Supervision/safety;Standing   Upper Body Bathing: Set up;Supervision/ safety;Sitting    Lower Body Bathing: Supervison/ safety;Sit to/from stand   Upper Body Dressing : Sitting;Set up   Lower Body Dressing: Supervision/safety;Sit to/from stand   Toilet Transfer: Supervision/safety;Ambulation;Regular Glass blower/designer Details (indicate cue type and reason): Simulated by sit to stand from [employer] Transfer Details (indicate cue type and reason): Pt uses tub bench so he does not have to step over side of tub. Functional mobility during ADLs: Supervision/safety General ADL Comments: Educated pt on energy conservation strategies and home safety. Pt found standing in room without supplemental O2 applied and SOB; SpO2=90% on RA. Applied 2L O2 with rise in SpO2 to mid 90s. Educated on breathing strategies      Vision Vision Assessment?: No apparent visual deficits   Perception     Praxis      Pertinent Vitals/Pain Pain Assessment: No/denies pain     Hand Dominance     Extremity/Trunk Assessment Upper Extremity Assessment Upper Extremity Assessment: Overall WFL for tasks assessed   Lower Extremity Assessment Lower Extremity Assessment: Defer to PT evaluation   Cervical / Trunk Assessment Cervical / Trunk Assessment: Kyphotic   Communication Communication Communication: No difficulties   Cognition Arousal/Alertness: Awake/alert Behavior During Therapy: WFL for tasks assessed/performed Overall Cognitive Status: Within Functional Limits for tasks assessed  General Comments       Exercises       Shoulder Instructions      Home Living Family/patient expects to be discharged to:: Private residence Living Arrangements: Children Available Help at Discharge: Family;Available 24 hours/day Type of Home: House Home Access: Other (comment) (ramp being built this Friday)     Home Layout: Two level;Able to live on main level with bedroom/bathroom     Bathroom Shower/Tub: Tub/shower unit Shower/tub characteristics:  Curtain Biochemist, clinical: Standard     Home Equipment: Environmental consultant - 4 wheels;Tub bench;Hand held shower head   Additional Comments: Home O2- 2L during the day and 4L at night      Prior Functioning/Environment Level of Independence: Needs assistance  Gait / Transfers Assistance Needed: Only ambulates short distances and uses 4WW when out due to need for seat.   ADL's / Homemaking Assistance Needed: pt participates in homemaking tasks, but son performs heavier chores. Independent for BADL.            OT Problem List:     OT Treatment/Interventions:      OT Goals(Current goals can be found in the care plan section) Acute Rehab OT Goals Patient Stated Goal: home today OT Goal Formulation: All assessment and education complete, DC therapy  OT Frequency:     Barriers to D/C:            Co-evaluation              End of Session Equipment Utilized During Treatment: Oxygen  Activity Tolerance: Patient tolerated treatment well Patient left: in bed;with bed alarm set;with call bell/phone within reach (sitting EOB)   Time: [id number] OT Time Calculation (min): 16 min Charges:  OT General Charges $OT Visit: 1 Procedure OT Evaluation $OT Eval Moderate Complexity: 1 Procedure G-Codes:     Binnie Kand M.S., OTR/L Pager: [phone]  02/24/2016, 4:15 PM

## 2016-02-24 NOTE — Discharge Summary (Signed)
Physician Discharge Summary  [name] [id number] DOB: March 12, 1941 DOA: 02/22/2016  PCP: Reubin Milan, MD  Admit date: 02/22/2016 Discharge date: 02/25/2016  Time spent: 45 minutes  Recommendations for Outpatient Follow-up:  Patient will be discharged to home.  Patient will need to follow up with primary care provider within one week of discharge, repeat CBC.  Follow up with your pulmonologist, Dr. Gwenette Greet, and cardiologist, Dr. Burt Knack- discuss your echocardiogram findings.  Patient should continue medications as prescribed.  Patient should follow a heart healthy/carb modified diet.   Discharge Diagnoses:  Acute on chronic respiratory failure with hypoxia due to COPD exacerbation Chest pain and history of CAD Leukocytosis Hyperlipidemia Hypertension Atrial Fibrillation GERD Diabetes mellitus, type II Chronic diastolic congestive heart failure   Discharge Condition: stable  Diet recommendation:  heart healthy/carb modified  Filed Weights   02/23/16 0524 02/24/16 0527 02/25/16 0500  Weight: 70.1 kg (154 lb 9.6 oz) 68.9 kg (152 lb) 69.5 kg (153 lb 3.2 oz)    History of present illness:  On 02/23/2016 by Dr. Ivor Costa [name] is a 75 y.o. male with medical history significant of hypertension, hyperlipidemia, diabetes mellitus, CAD, s/p of CABG, COPD on 2 L oxygen at home, GERD, depression, anxiety, PAF (not on anticoagulants possibly due to history of GI bleeding), dCHF, former alcohol abuser, who presents with shortness of breath, productive cough and CP. Patient states that his symptoms started last night. He coughs up greenish colored sputum with SOB. No runny nose or sore throat. No fever or chills. He also has chest pain, which is located in the front chest, constant, dull, nonradiating, 7 out of 10 in severity. He was treated with aspirin and nitroglycerin by EMS, his chest pain is resolved. Currently chest pain-free. He continues to have short stress and cough.  Patient does not have nausea, vomiting, abdominal pain, diarrhea, symptoms of UTI or unilateral weakness.   Hospital Course:  Acute on chronic respiratory failure with hypoxia due to COPD exacerbation -patient does use 2L of oxygen at home -Upon admission, patient did have productive cough and wheezing.  -CXR unremarkable for infection -Feels breathing is back to baseline -Continue nebs, azithromycin -Will discharge patient with prednisone taper -Respiratory viral panel and influenza negative -Blood cultures show no growth  Chest pain and history of CAD -s/p stent placement.  -Currently chest pain free -Patient states he recently fell and broke a few ribs and followed up with the [facility]  -Troponin cycled and unremarkable -Dr. Sallyanne Kuster reviewed EKG with admitting physician, did not feel EKG represented STEMI (Admission EKG showed ST depression V4-6) -Continue statin -was placed on aspirin during hospitalization. Should discuss continuation with PCP given his history of GI bleed. -Echocardiogram EF 55-65%, PA pressure 71mmhg -Patient will follow up with Dr. Gwenette Greet and Dr. Burt Knack as an outpatient  Leukocytosis -Likely secondary to steroids -Repeat CBC in one week  Hyperlipidemia -Continue statin  Hypertension -continue lasix, Cardizem  Atrial Fibrillation -CHA2DS2-VASc Score 6 -Not on anticoagulation due to history of GIB -Continue Cardizem  GERD -Continue PPI  Diabetes mellitus, type II -Hemoglobin A1c 7.2 -Continue home regimen upon discharge  Chronic diastolic congestive heart failure  -Echocardiogram 02/21/14 showed EF 55-60%.  -Currently appears compensated and euvolemic -Continue home Lasix -BNP 61.5  Procedures: Echocardiogram  Consultations: None  Discharge Exam: Vitals:   02/24/16 2048 02/25/16 0500  BP: 93/70 99/71  Pulse: 74 70  Resp: 18   Temp: 97.6 F (36.4 C) 98.1 F (36.7 C)  Patient feels breathing has improved and back to  his baseline. He denies further chest pain, headache, dizziness, abdominal pain, nausea,vomiting, diarrhea, constipation.  Would like to go home.    General: Well developed, well nourished, NAD, appears stated age  69: NCAT, mucous membranes moist.  Cardiovascular: S1 S2 auscultated, irregular  Respiratory: Diminished but clear, mild expiratory wheezing  Abdomen: Soft, nontender, nondistended, + bowel sounds  Extremities: warm dry without cyanosis clubbing or edema  Neuro: AAOx3, nonfocal  Psych: Normal affect and demeanor with intact judgement and insight  Discharge Instructions Discharge Instructions    Discharge instructions    Complete by:  As directed    Patient will be discharged to home.  Patient will need to follow up with primary care provider within one week of discharge, repeat CBC.  Patient should continue medications as prescribed.  Patient should follow a heart healthy/carb modified diet.   Discharge instructions    Complete by:  As directed    Patient will be discharged to home.  Patient will need to follow up with primary care provider within one week of discharge, repeat CBC.  Follow up with your pulmonologist, Dr. Gwenette Greet, and cardiologist, Dr. Burt Knack- discuss your echocardiogram findings.  Patient should continue medications as prescribed.  Patient should follow a heart healthy/carb modified diet.     Current Discharge Medication List    START taking these medications   Details  aspirin EC 81 MG EC tablet Take 1 tablet (81 mg total) by mouth daily.    azithromycin (ZITHROMAX) 250 MG tablet Take 1 tablet (250 mg total) by mouth daily. Qty: 4 each, Refills: 0    dextromethorphan-guaiFENesin (MUCINEX DM) 30-600 MG 12hr tablet Take 1 tablet by mouth 2 (two) times daily. Qty: 14 tablet, Refills: 0    predniSONE (DELTASONE) 10 MG tablet Take 1 tablet (10 mg total) by mouth daily with breakfast. Take 40mg  (4 tabs) x 3 days, then taper to 30mg  (3 tabs) x 3 days,  then 20mg  (2 tabs) x 3days, then 10mg  (1 tab) x 3days, then stop. Qty: 30 tablet, Refills: 0      CONTINUE these medications which have NOT CHANGED   Details  albuterol (PROVENTIL) (2.5 MG/3ML) 0.083% nebulizer solution Take 2.5 mg by nebulization every 6 (six) hours as needed for wheezing or shortness of breath.    alendronate (FOSAMAX) 70 MG tablet Take 70 mg by mouth every Saturday.     ALPRAZolam (XANAX) 0.25 MG tablet Take 0.25 mg by mouth at bedtime as needed for anxiety or sleep.    atorvastatin (LIPITOR) 40 MG tablet Take 40 mg by mouth daily.    budesonide-formoterol (SYMBICORT) 160-4.5 MCG/ACT inhaler Inhale 2 puffs into the lungs 2 (two) times daily.    busPIRone (BUSPAR) 7.5 MG tablet Take 7.5 mg by mouth at bedtime.     Calcium Carbonate-Vit D-Min (CALTRATE 600+D PLUS) 600-400 MG-UNIT per tablet Chew 1 tablet by mouth daily.      cetirizine (ZYRTEC ALLERGY) 10 MG tablet Take 1 tablet (10 mg total) by mouth every morning.    diltiazem (CARDIZEM CD) 240 MG 24 hr capsule Take 1 capsule (240 mg total) by mouth daily.    furosemide (LASIX) 40 MG tablet TAKE 1 TABLET BY MOUTH EVERY DAY Qty: 30 tablet, Refills: 9    insulin detemir (LEVEMIR) 100 UNIT/ML injection Inject 3 Units into the skin at bedtime.     insulin lispro (HUMALOG) 100 UNIT/ML injection Inject 3 Units into the skin 3 (three)  times daily as needed for high blood sugar.     lactobacillus acidophilus (BACID) TABS tablet Take 2 tablets by mouth every morning.     OXYGEN Inhale 2-4 L into the lungs See admin instructions. Inhale 2 L continuous during the day and 4 L at night for sleep    pantoprazole (PROTONIX) 40 MG tablet TAKE 1 TABLET BY MOUTH TWICE A DAY Qty: 180 tablet, Refills: 1    potassium chloride SA (K-DUR,KLOR-CON) 20 MEQ tablet Take 1 tablet (20 mEq total) by mouth daily. Qty: 30 tablet, Refills: 3    SPIRIVA HANDIHALER 18 MCG inhalation capsule INHALE CONTENTS OF 1 CAPSULE DAILY Qty: 30  capsule, Refills: 0    acetaminophen (TYLENOL) 500 MG tablet Take 500 mg by mouth every 6 (six) hours as needed for mild pain or moderate pain.      STOP taking these medications     cefdinir (OMNICEF) 300 MG capsule        Allergies  Allergen Reactions  . Xarelto [Rivaroxaban] Other (See Comments)    makes pt crazy  . Ticlopidine Hcl Swelling  . Lorazepam Other (See Comments)    Altered mental status from 2 doses of IV Ativan on 05/10/14.   Follow-up Information    Reubin Milan, MD. Schedule an appointment as soon as possible for a visit in 1 week(s).   Specialty:  Internal Medicine Why:  Hopsital follow up Contact information: [address] [place] [address] [phone]            The results of significant diagnostics from this hospitalization (including imaging, microbiology, ancillary and laboratory) are listed below for reference.    Significant Diagnostic Studies: Dg Chest Port 1 View  Result Date: 02/23/2016 CLINICAL DATA:  Chest pain and shortness of breath tonight. EXAM: PORTABLE CHEST 1 VIEW COMPARISON:  02/18/2015 FINDINGS: Postoperative changes in the mediastinum. Shallow inspiration. Heart size and pulmonary vascularity are normal. Diffuse interstitial fibrosis throughout the lungs. No focal airspace disease or consolidation. No blunting of costophrenic angles. No pneumothorax. Calcified and tortuous aorta. Surgical clips in the base of the neck. IMPRESSION: Shallow inspiration. Diffuse pulmonary fibrosis. No evidence of active pulmonary disease. Electronically Signed   By: Lucienne Capers M.D.   On: 02/23/2016 00:07    Microbiology: Recent Results (from the past 240 hour(s))  Culture, blood (routine x 2) Call MD if unable to obtain prior to antibiotics being given     Status: None (Preliminary result)   Collection Time: 02/23/16  1:50 AM  Result Value Ref Range Status   Specimen Description BLOOD LEFT HAND  Final   Special Requests IN  PEDIATRIC BOTTLE 4ML  Final   Culture NO GROWTH 1 DAY  Final   Report Status PENDING  Incomplete  Culture, blood (routine x 2) Call MD if unable to obtain prior to antibiotics being given     Status: None (Preliminary result)   Collection Time: 02/23/16  1:50 AM  Result Value Ref Range Status   Specimen Description BLOOD RIGHT HAND  Final   Special Requests BOTTLES DRAWN AEROBIC AND ANAEROBIC 10ML  Final   Culture NO GROWTH 1 DAY  Final   Report Status PENDING  Incomplete  Respiratory Panel by PCR     Status: None   Collection Time: 02/23/16  3:37 PM  Result Value Ref Range Status   Adenovirus NOT DETECTED NOT DETECTED Final   Coronavirus 229E NOT DETECTED NOT DETECTED Final   Coronavirus HKU1 NOT DETECTED NOT DETECTED  Final   Coronavirus NL63 NOT DETECTED NOT DETECTED Final   Coronavirus OC43 NOT DETECTED NOT DETECTED Final   Metapneumovirus NOT DETECTED NOT DETECTED Final   Rhinovirus / Enterovirus NOT DETECTED NOT DETECTED Final   Influenza A NOT DETECTED NOT DETECTED Final   Influenza B NOT DETECTED NOT DETECTED Final   Parainfluenza Virus 1 NOT DETECTED NOT DETECTED Final   Parainfluenza Virus 2 NOT DETECTED NOT DETECTED Final   Parainfluenza Virus 3 NOT DETECTED NOT DETECTED Final   Parainfluenza Virus 4 NOT DETECTED NOT DETECTED Final   Respiratory Syncytial Virus NOT DETECTED NOT DETECTED Final   Bordetella pertussis NOT DETECTED NOT DETECTED Final   Chlamydophila pneumoniae NOT DETECTED NOT DETECTED Final   Mycoplasma pneumoniae NOT DETECTED NOT DETECTED Final     Labs: Basic Metabolic Panel:  Recent Labs Lab 02/22/16 2324 02/22/16 2329 02/24/16 0450  NA 139 143 139  K 3.9 3.8 4.4  CL 105 101 101  CO2 26  --  24  GLUCOSE 187* 183* 185*  BUN 17 19 17   CREATININE 1.12 1.10 0.91  CALCIUM 8.0*  --  9.2   Liver Function Tests:  Recent Labs Lab 02/22/16 2324  AST 19  ALT 15*  ALKPHOS 51  BILITOT 0.4  PROT 5.3*  ALBUMIN 3.1*   No results for input(s):  LIPASE, AMYLASE in the last 168 hours. No results for input(s): AMMONIA in the last 168 hours. CBC:  Recent Labs Lab 02/22/16 2324 02/22/16 2329 02/24/16 0450  WBC 8.5  --  15.5*  NEUTROABS 4.7  --  14.4*  HGB 14.3 14.6 14.8  HCT 43.5 43.0 44.6  MCV 97.8  --  97.2  PLT 150  --  174   Cardiac Enzymes:  Recent Labs Lab 02/22/16 2324 02/23/16 0048 02/23/16 0717 02/23/16 1238  TROPONINI <0.03 <0.03 <0.03 <0.03   BNP: BNP (last 3 results)  Recent Labs  02/23/16 0147  BNP 61.5    ProBNP (last 3 results) No results for input(s): PROBNP in the last 8760 hours.  CBG:  Recent Labs Lab 02/24/16 0742 02/24/16 1126 02/24/16 1607 02/24/16 2153 02/25/16 0728  GLUCAP 174* 249* 225* 234* 262*       Signed:  Cristal Ford  [facility] 02/25/2016, 10:08 AM

## 2016-02-25 LAB — GLUCOSE, CAPILLARY
GLUCOSE-CAPILLARY: 234 mg/dL — AB (ref 65–99)
Glucose-Capillary: 262 mg/dL — ABNORMAL HIGH (ref 65–99)

## 2016-02-25 MED ORDER — PREDNISONE 20 MG PO TABS
60.0000 mg | ORAL_TABLET | Freq: Every day | ORAL | Status: DC
  Administered 2016-02-25: 60 mg via ORAL
  Filled 2016-02-25: qty 3

## 2016-02-25 NOTE — Progress Notes (Addendum)
Pt son states he will be okay without 02 until they get  home, pt agreed. Etta Quill, RN

## 2016-02-25 NOTE — Care Management Note (Signed)
Case Management Note  Patient Details  Name: [name] MRN: [id number] Date of Birth: [birth year]  Subjective/Objective:    Pt presented with COPD exacerbation. Pt is from home with support of son. Pt has DME 02 at home. CM did ask son if he had a tank for home. Son stated that pt would be able to manage until he got home without 02. Son stated not far from the hospital.  CM did speak with pt in regards to disposition needs as well.                 Action/Plan: CM received referral for COPD Gold Protocol. CM did discuss benefits of HHRN. Pt states he will call the [facility] if he needs [facility] RN. He has had in the past and is refusing services at this time. Pt is aware of his resources. No further needs at this time.   Expected Discharge Date:                  Expected Discharge Plan:  Home/Self Care  In-House Referral:  NA  Discharge planning Services  CM Consult  Post Acute Care Choice:  NA Choice offered to:  NA  DME Arranged:  N/A DME Agency:  NA  HH Arranged:  NA HH Agency:  NA  Status of Service:  Completed, signed off  If discussed at [place] of Stay Meetings, dates discussed:    Additional Comments:  Bethena Roys, RN 02/25/2016, 10:48 AM

## 2016-02-25 NOTE — Discharge Instructions (Signed)
Chronic Obstructive Pulmonary Disease Chronic obstructive pulmonary disease (COPD) is a common lung problem. In COPD, the flow of air from the lungs is limited. The way your lungs work will probably never return to normal, but there are things you can do to improve your lungs and make yourself feel better. Your doctor may treat your condition with:  Medicines.  Oxygen.  Lung surgery.  Changes to your diet.  Rehabilitation. This may involve a team of specialists. Follow these instructions at home:  Take all medicines as told by your doctor.  Avoid medicines or cough syrups that dry up your airway (such as antihistamines) and do not allow you to get rid of thick spit. You do not need to avoid them if told differently by your doctor.  If you smoke, stop. Smoking makes the problem worse.  Avoid being around things that make your breathing worse (like smoke, chemicals, and fumes).  Use oxygen therapy and therapy to help improve your lungs (pulmonary rehabilitation) if told by your doctor. If you need home oxygen therapy, ask your doctor if you should buy a tool to measure your oxygen level (oximeter).  Avoid people who have a sickness you can catch (contagious).  Avoid going outside when it is very hot, cold, or humid.  Eat healthy foods. Eat smaller meals more often. Rest before meals.  Stay active, but remember to also rest.  Make sure to get all the shots (vaccines) your doctor recommends. Ask your doctor if you need a pneumonia shot.  Learn and use tips on how to relax.  Learn and use tips on how to control your breathing as told by your doctor. Try: 1. Breathing in (inhaling) through your nose for 1 second. Then, pucker your lips and breath out (exhale) through your lips for 2 seconds. 2. Putting one hand on your belly (abdomen). Breathe in slowly through your nose for 1 second. Your hand on your belly should move out. Pucker your lips and breathe out slowly through your lips.  Your hand on your belly should move in as you breathe out.  Learn and use controlled coughing to clear thick spit from your lungs. The steps are: 1. Lean your head a little forward. 2. Breathe in deeply. 3. Try to hold your breath for 3 seconds. 4. Keep your mouth slightly open while coughing 2 times. 5. Spit any thick spit out into a tissue. 6. Rest and do the steps again 1 or 2 times as needed. Contact a doctor if:  You cough up more thick spit than usual.  There is a change in the color or thickness of the spit.  It is harder to breathe than usual.  Your breathing is faster than usual. Get help right away if:  You have shortness of breath while resting.  You have shortness of breath that stops you from:  Being able to talk.  Doing normal activities.  You chest hurts for longer than 5 minutes.  Your skin color is more blue than usual.  Your pulse oximeter shows that you have low oxygen for longer than 5 minutes. This information is not intended to replace advice given to you by your health care provider. Make sure you discuss any questions you have with your health care provider. Document Released: 08/31/2007 Document Revised: 08/20/2015 Document Reviewed: 11/08/2012 Elsevier Interactive Patient Education  2017 [employer]  

## 2016-02-25 NOTE — Progress Notes (Signed)
Pt discharged home. Discharge instructions have been gone over with the patient and his son. IV's removed. Pt given unit number and told to call if they have any concerns regarding their discharge instructions. Etta Quill, RN

## 2016-02-28 LAB — CULTURE, BLOOD (ROUTINE X 2)
Culture: NO GROWTH
Culture: NO GROWTH

## 2016-06-10 ENCOUNTER — Emergency Department (HOSPITAL_COMMUNITY): Payer: Medicare Other

## 2016-06-10 ENCOUNTER — Encounter (HOSPITAL_COMMUNITY): Payer: Self-pay

## 2016-06-10 ENCOUNTER — Inpatient Hospital Stay (HOSPITAL_COMMUNITY)
Admission: EM | Admit: 2016-06-10 | Discharge: 2016-06-14 | DRG: 193 | Disposition: A | Discharge status: Home / Self Care | Payer: Medicare Other | Attending: Infectious Disease | Admitting: Infectious Disease

## 2016-06-10 DIAGNOSIS — I110 Hypertensive heart disease with heart failure: Secondary | ICD-10-CM | POA: Diagnosis present

## 2016-06-10 DIAGNOSIS — M810 Age-related osteoporosis without current pathological fracture: Secondary | ICD-10-CM | POA: Diagnosis present

## 2016-06-10 DIAGNOSIS — I5032 Chronic diastolic (congestive) heart failure: Secondary | ICD-10-CM | POA: Diagnosis not present

## 2016-06-10 DIAGNOSIS — E1165 Type 2 diabetes mellitus with hyperglycemia: Secondary | ICD-10-CM | POA: Diagnosis not present

## 2016-06-10 DIAGNOSIS — J9621 Acute and chronic respiratory failure with hypoxia: Secondary | ICD-10-CM | POA: Diagnosis present

## 2016-06-10 DIAGNOSIS — Z955 Presence of coronary angioplasty implant and graft: Secondary | ICD-10-CM

## 2016-06-10 DIAGNOSIS — Y92230 Patient room in hospital as the place of occurrence of the external cause: Secondary | ICD-10-CM | POA: Diagnosis not present

## 2016-06-10 DIAGNOSIS — Z66 Do not resuscitate: Secondary | ICD-10-CM | POA: Diagnosis present

## 2016-06-10 DIAGNOSIS — I480 Paroxysmal atrial fibrillation: Secondary | ICD-10-CM | POA: Diagnosis not present

## 2016-06-10 DIAGNOSIS — Z7951 Long term (current) use of inhaled steroids: Secondary | ICD-10-CM | POA: Diagnosis not present

## 2016-06-10 DIAGNOSIS — K219 Gastro-esophageal reflux disease without esophagitis: Secondary | ICD-10-CM | POA: Diagnosis present

## 2016-06-10 DIAGNOSIS — Z6825 Body mass index (BMI) 25.0-25.9, adult: Secondary | ICD-10-CM

## 2016-06-10 DIAGNOSIS — I2510 Atherosclerotic heart disease of native coronary artery without angina pectoris: Secondary | ICD-10-CM | POA: Diagnosis not present

## 2016-06-10 DIAGNOSIS — E785 Hyperlipidemia, unspecified: Secondary | ICD-10-CM | POA: Diagnosis not present

## 2016-06-10 DIAGNOSIS — J189 Pneumonia, unspecified organism: Secondary | ICD-10-CM

## 2016-06-10 DIAGNOSIS — R0602 Shortness of breath: Secondary | ICD-10-CM | POA: Diagnosis not present

## 2016-06-10 DIAGNOSIS — R296 Repeated falls: Secondary | ICD-10-CM | POA: Diagnosis present

## 2016-06-10 DIAGNOSIS — Z7982 Long term (current) use of aspirin: Secondary | ICD-10-CM

## 2016-06-10 DIAGNOSIS — I739 Peripheral vascular disease, unspecified: Secondary | ICD-10-CM | POA: Diagnosis present

## 2016-06-10 DIAGNOSIS — J441 Chronic obstructive pulmonary disease with (acute) exacerbation: Secondary | ICD-10-CM | POA: Diagnosis not present

## 2016-06-10 DIAGNOSIS — J9620 Acute and chronic respiratory failure, unspecified whether with hypoxia or hypercapnia: Secondary | ICD-10-CM | POA: Diagnosis present

## 2016-06-10 DIAGNOSIS — I6529 Occlusion and stenosis of unspecified carotid artery: Secondary | ICD-10-CM | POA: Diagnosis present

## 2016-06-10 DIAGNOSIS — Z87891 Personal history of nicotine dependence: Secondary | ICD-10-CM

## 2016-06-10 DIAGNOSIS — M1990 Unspecified osteoarthritis, unspecified site: Secondary | ICD-10-CM | POA: Diagnosis present

## 2016-06-10 DIAGNOSIS — E119 Type 2 diabetes mellitus without complications: Secondary | ICD-10-CM | POA: Diagnosis not present

## 2016-06-10 DIAGNOSIS — I272 Pulmonary hypertension, unspecified: Secondary | ICD-10-CM | POA: Diagnosis present

## 2016-06-10 DIAGNOSIS — E1151 Type 2 diabetes mellitus with diabetic peripheral angiopathy without gangrene: Secondary | ICD-10-CM | POA: Diagnosis present

## 2016-06-10 DIAGNOSIS — T380X5A Adverse effect of glucocorticoids and synthetic analogues, initial encounter: Secondary | ICD-10-CM | POA: Diagnosis not present

## 2016-06-10 DIAGNOSIS — Z888 Allergy status to other drugs, medicaments and biological substances status: Secondary | ICD-10-CM

## 2016-06-10 DIAGNOSIS — Z79899 Other long term (current) drug therapy: Secondary | ICD-10-CM

## 2016-06-10 DIAGNOSIS — Z9981 Dependence on supplemental oxygen: Secondary | ICD-10-CM

## 2016-06-10 DIAGNOSIS — J440 Chronic obstructive pulmonary disease with acute lower respiratory infection: Secondary | ICD-10-CM | POA: Diagnosis present

## 2016-06-10 DIAGNOSIS — I252 Old myocardial infarction: Secondary | ICD-10-CM

## 2016-06-10 DIAGNOSIS — A419 Sepsis, unspecified organism: Secondary | ICD-10-CM | POA: Diagnosis not present

## 2016-06-10 DIAGNOSIS — Z8249 Family history of ischemic heart disease and other diseases of the circulatory system: Secondary | ICD-10-CM

## 2016-06-10 DIAGNOSIS — R0789 Other chest pain: Secondary | ICD-10-CM | POA: Diagnosis not present

## 2016-06-10 DIAGNOSIS — R069 Unspecified abnormalities of breathing: Secondary | ICD-10-CM | POA: Diagnosis not present

## 2016-06-10 DIAGNOSIS — Z951 Presence of aortocoronary bypass graft: Secondary | ICD-10-CM

## 2016-06-10 DIAGNOSIS — Z885 Allergy status to narcotic agent status: Secondary | ICD-10-CM

## 2016-06-10 DIAGNOSIS — Z7952 Long term (current) use of systemic steroids: Secondary | ICD-10-CM

## 2016-06-10 DIAGNOSIS — R918 Other nonspecific abnormal finding of lung field: Secondary | ICD-10-CM | POA: Diagnosis not present

## 2016-06-10 DIAGNOSIS — J9611 Chronic respiratory failure with hypoxia: Secondary | ICD-10-CM | POA: Diagnosis not present

## 2016-06-10 DIAGNOSIS — R410 Disorientation, unspecified: Secondary | ICD-10-CM | POA: Diagnosis not present

## 2016-06-10 DIAGNOSIS — R630 Anorexia: Secondary | ICD-10-CM | POA: Diagnosis present

## 2016-06-10 DIAGNOSIS — Z794 Long term (current) use of insulin: Secondary | ICD-10-CM | POA: Diagnosis not present

## 2016-06-10 HISTORY — DX: Chronic obstructive pulmonary disease with (acute) exacerbation: J44.1

## 2016-06-10 LAB — CBC
HCT: 47.5 % (ref 39.0–52.0)
Hemoglobin: 15.6 g/dL (ref 13.0–17.0)
MCH: 33.1 pg (ref 26.0–34.0)
MCHC: 32.8 g/dL (ref 30.0–36.0)
MCV: 100.8 fL — ABNORMAL HIGH (ref 78.0–100.0)
PLATELETS: 121 10*3/uL — AB (ref 150–400)
RBC: 4.71 MIL/uL (ref 4.22–5.81)
RDW: 14.1 % (ref 11.5–15.5)
WBC: 5.8 10*3/uL (ref 4.0–10.5)

## 2016-06-10 LAB — I-STAT TROPONIN, ED: TROPONIN I, POC: 0.01 ng/mL (ref 0.00–0.08)

## 2016-06-10 LAB — MRSA PCR SCREENING: MRSA BY PCR: NEGATIVE

## 2016-06-10 LAB — BASIC METABOLIC PANEL
Anion gap: 10 (ref 5–15)
BUN: 13 mg/dL (ref 6–20)
CALCIUM: 8.8 mg/dL — AB (ref 8.9–10.3)
CO2: 29 mmol/L (ref 22–32)
CREATININE: 1.11 mg/dL (ref 0.61–1.24)
Chloride: 99 mmol/L — ABNORMAL LOW (ref 101–111)
Glucose, Bld: 305 mg/dL — ABNORMAL HIGH (ref 65–99)
Potassium: 4.3 mmol/L (ref 3.5–5.1)
Sodium: 138 mmol/L (ref 135–145)

## 2016-06-10 LAB — URINALYSIS, ROUTINE W REFLEX MICROSCOPIC
Bacteria, UA: NONE SEEN
Bilirubin Urine: NEGATIVE
Glucose, UA: 50 mg/dL — AB
Ketones, ur: NEGATIVE mg/dL
Leukocytes, UA: NEGATIVE
Nitrite: NEGATIVE
Protein, ur: NEGATIVE mg/dL
Specific Gravity, Urine: 1.014 (ref 1.005–1.030)
Squamous Epithelial / LPF: NONE SEEN
pH: 5 (ref 5.0–8.0)

## 2016-06-10 LAB — GLUCOSE, CAPILLARY
Glucose-Capillary: 199 mg/dL — ABNORMAL HIGH (ref 65–99)
Glucose-Capillary: 280 mg/dL — ABNORMAL HIGH (ref 65–99)

## 2016-06-10 LAB — I-STAT CG4 LACTIC ACID, ED
Lactic Acid, Venous: 2.24 mmol/L (ref 0.5–1.9)
Lactic Acid, Venous: 1.78 mmol/L (ref 0.5–1.9)

## 2016-06-10 LAB — PROCALCITONIN: PROCALCITONIN: 0.35 ng/mL

## 2016-06-10 MED ORDER — SODIUM CHLORIDE 0.9 % IV BOLUS (SEPSIS): 1000.0000 mL | Freq: Once | INTRAVENOUS | Status: DC

## 2016-06-10 MED ORDER — IPRATROPIUM-ALBUTEROL 0.5-2.5 (3) MG/3ML IN SOLN
3.0000 mL | Freq: Once | RESPIRATORY_TRACT | Status: AC
  Administered 2016-06-10: 3 mL via RESPIRATORY_TRACT
  Filled 2016-06-10: qty 3

## 2016-06-10 MED ORDER — INSULIN ASPART 100 UNIT/ML ~~LOC~~ SOLN: 0.0000 [IU] | Freq: Every day | SUBCUTANEOUS | Status: DC

## 2016-06-10 MED ORDER — ACETAMINOPHEN 650 MG RE SUPP
650.0000 mg | Freq: Four times a day (QID) | RECTAL | Status: DC | PRN
Start: 2016-06-10 — End: 2016-06-14

## 2016-06-10 MED ORDER — ASPIRIN EC 81 MG PO TBEC
81.0000 mg | DELAYED_RELEASE_TABLET | Freq: Every day | ORAL | Status: DC
  Administered 2016-06-10 – 2016-06-14 (×5): 81 mg via ORAL
  Filled 2016-06-10 (×5): qty 1

## 2016-06-10 MED ORDER — TIOTROPIUM BROMIDE MONOHYDRATE 18 MCG IN CAPS
1.0000 | ORAL_CAPSULE | Freq: Every day | RESPIRATORY_TRACT | Status: DC
  Filled 2016-06-10: qty 5

## 2016-06-10 MED ORDER — ATORVASTATIN CALCIUM 40 MG PO TABS
40.0000 mg | ORAL_TABLET | Freq: Every day | ORAL | Status: DC
Start: 2016-06-11 — End: 2016-06-14
  Administered 2016-06-11 – 2016-06-14 (×4): 40 mg via ORAL
  Filled 2016-06-10 (×4): qty 1

## 2016-06-10 MED ORDER — DEXTROSE 5 % IV SOLN: 1.0000 g | Freq: Once | INTRAVENOUS | Status: DC

## 2016-06-10 MED ORDER — METHYLPREDNISOLONE SODIUM SUCC 125 MG IJ SOLR
125.0000 mg | Freq: Once | INTRAMUSCULAR | Status: AC
  Administered 2016-06-10: 125 mg via INTRAVENOUS
  Filled 2016-06-10: qty 2

## 2016-06-10 MED ORDER — ENOXAPARIN SODIUM 40 MG/0.4ML ~~LOC~~ SOLN
40.0000 mg | SUBCUTANEOUS | Status: DC
  Administered 2016-06-10 – 2016-06-13 (×4): 40 mg via SUBCUTANEOUS
  Filled 2016-06-10 (×4): qty 0.4

## 2016-06-10 MED ORDER — ALBUTEROL (5 MG/ML) CONTINUOUS INHALATION SOLN
10.0000 mg/h | INHALATION_SOLUTION | Freq: Once | RESPIRATORY_TRACT | Status: AC
  Administered 2016-06-10: 10 mg/h via RESPIRATORY_TRACT
  Filled 2016-06-10: qty 20

## 2016-06-10 MED ORDER — ONDANSETRON HCL 4 MG/2ML IJ SOLN: 4.0000 mg | Freq: Four times a day (QID) | INTRAMUSCULAR | Status: DC | PRN

## 2016-06-10 MED ORDER — SODIUM CHLORIDE 0.9 % IV BOLUS (SEPSIS)
250.0000 mL | Freq: Once | INTRAVENOUS | Status: AC
  Administered 2016-06-10: 250 mL via INTRAVENOUS

## 2016-06-10 MED ORDER — ACETAMINOPHEN 325 MG PO TABS
650.0000 mg | ORAL_TABLET | Freq: Four times a day (QID) | ORAL | Status: DC | PRN
  Administered 2016-06-13: 650 mg via ORAL

## 2016-06-10 MED ORDER — POLYETHYLENE GLYCOL 3350 17 G PO PACK
17.0000 g | PACK | Freq: Every day | ORAL | Status: DC
  Administered 2016-06-12 – 2016-06-14 (×3): 17 g via ORAL
  Filled 2016-06-10 (×4): qty 1

## 2016-06-10 MED ORDER — INSULIN DETEMIR 100 UNIT/ML ~~LOC~~ SOLN
5.0000 [IU] | Freq: Every day | SUBCUTANEOUS | Status: DC
  Administered 2016-06-10: 5 [IU] via SUBCUTANEOUS
  Filled 2016-06-10 (×2): qty 0.05

## 2016-06-10 MED ORDER — ORAL CARE MOUTH RINSE
15.0000 mL | Freq: Two times a day (BID) | OROMUCOSAL | Status: DC
  Administered 2016-06-12 – 2016-06-14 (×5): 15 mL via OROMUCOSAL

## 2016-06-10 MED ORDER — MOMETASONE FURO-FORMOTEROL FUM 200-5 MCG/ACT IN AERO
2.0000 | INHALATION_SPRAY | Freq: Two times a day (BID) | RESPIRATORY_TRACT | Status: DC
  Administered 2016-06-11 – 2016-06-14 (×8): 2 via RESPIRATORY_TRACT
  Filled 2016-06-10 (×3): qty 8.8

## 2016-06-10 MED ORDER — DILTIAZEM HCL ER COATED BEADS 120 MG PO CP24
240.0000 mg | ORAL_CAPSULE | Freq: Every day | ORAL | Status: DC
  Administered 2016-06-11 – 2016-06-14 (×4): 240 mg via ORAL
  Filled 2016-06-10 (×4): qty 2
  Filled 2016-06-10: qty 1

## 2016-06-10 MED ORDER — DEXTROSE 5 % IV SOLN: 500.0000 mg | Freq: Once | INTRAVENOUS | Status: DC

## 2016-06-10 MED ORDER — DM-GUAIFENESIN ER 30-600 MG PO TB12
1.0000 | ORAL_TABLET | Freq: Two times a day (BID) | ORAL | Status: DC
  Administered 2016-06-10 – 2016-06-14 (×9): 1 via ORAL
  Filled 2016-06-10 (×9): qty 1

## 2016-06-10 MED ORDER — DEXTROSE 5 % IV SOLN
500.0000 mg | INTRAVENOUS | Status: DC
  Administered 2016-06-10: 500 mg via INTRAVENOUS
  Filled 2016-06-10 (×2): qty 500

## 2016-06-10 MED ORDER — DEXTROSE 5 % IV SOLN
1.0000 g | INTRAVENOUS | Status: DC
  Administered 2016-06-10: 1 g via INTRAVENOUS
  Filled 2016-06-10 (×2): qty 10

## 2016-06-10 MED ORDER — METHYLPREDNISOLONE SODIUM SUCC 125 MG IJ SOLR
125.0000 mg | INTRAMUSCULAR | Status: DC
  Administered 2016-06-10: 125 mg via INTRAVENOUS
  Filled 2016-06-10: qty 2

## 2016-06-10 MED ORDER — ONDANSETRON HCL 4 MG PO TABS: 4.0000 mg | ORAL_TABLET | Freq: Four times a day (QID) | ORAL | Status: DC | PRN

## 2016-06-10 MED ORDER — IPRATROPIUM-ALBUTEROL 0.5-2.5 (3) MG/3ML IN SOLN
3.0000 mL | Freq: Four times a day (QID) | RESPIRATORY_TRACT | Status: DC
  Administered 2016-06-10: 3 mL via RESPIRATORY_TRACT
  Filled 2016-06-10: qty 3

## 2016-06-10 MED ORDER — INSULIN ASPART 100 UNIT/ML ~~LOC~~ SOLN
0.0000 [IU] | Freq: Three times a day (TID) | SUBCUTANEOUS | Status: DC
  Administered 2016-06-11: 2 [IU] via SUBCUTANEOUS
  Administered 2016-06-11: 5 [IU] via SUBCUTANEOUS
  Administered 2016-06-11: 8 [IU] via SUBCUTANEOUS
  Administered 2016-06-12 (×2): 3 [IU] via SUBCUTANEOUS
  Administered 2016-06-13: 5 [IU] via SUBCUTANEOUS
  Administered 2016-06-13: 3 [IU] via SUBCUTANEOUS
  Administered 2016-06-13: 2 [IU] via SUBCUTANEOUS
  Administered 2016-06-14: 5 [IU] via SUBCUTANEOUS
  Administered 2016-06-14: 3 [IU] via SUBCUTANEOUS

## 2016-06-10 MED ORDER — SODIUM CHLORIDE 0.9 % IV BOLUS (SEPSIS)
1000.0000 mL | Freq: Once | INTRAVENOUS | Status: AC
  Administered 2016-06-10: 1000 mL via INTRAVENOUS

## 2016-06-10 NOTE — ED Triage Notes (Signed)
Pt presents for evaluation of SOB/CP worsening over past 3 days. Pt on 2-4L home O2 at baseline. Pt reports productive cough with green sputum. Pt reports CP is central and non radiating.

## 2016-06-10 NOTE — ED Notes (Signed)
Patient taken to xray.

## 2016-06-10 NOTE — Progress Notes (Signed)
This RN request new orders from MD for sputum culture, CBG checks with insulin coverage and PRN nebulizer treatments for patient based upon assessment. New orders received for scheduled nebulizer treatments at this time.

## 2016-06-10 NOTE — ED Provider Notes (Signed)
[facility] DEPT Provider Note   CSN: [id number] Arrival date & time: 06/10/16  1059     History   Chief Complaint Chief Complaint  Patient presents with  . Chest Pain  . Shortness of Breath    HPI [name] is a 76 y.o. male.  HPI   76 year old male with a significant past medical history of hypertension, hyperlipidemia, DM, CAD status post CABG, COPD on 2 L during the day 4 L at night, GERD, depression, anxiety, PAF not on anticoagulants due to history of GI bleed, diastolic congestive heart failure, former alcohol abuser presents today with shortness of breath and productive cough.  Patient is accompanied by his son who provides majority of the history.  Son notes approximate 5 days ago patient developed fatigue, cough, decreased appetite.  They note over the subsequent 4 days patient developed worsening shortness of breath over his baseline, cough becoming more productive.  Patient was seen by EMS and given breathing treatments last night, these did not improve his symptoms.  Again he had another breathing treatment this morning around 8 AM, but continued to have productive cough and shortness of breath.  Patient has been afebrile at home, has been tolerating fluids.  Patient has no recent hospitalizations.  He reports he had chest pain last night and this morning, none presently.  Patient is a diabetic, using NovoLog and Levemir based on his blood sugar readings, did not take it today.  Patient did receive the influenza vaccine this year.   Past Medical History:  Diagnosis Date  . Alcohol abuse   . Allergic rhinitis   . ANEMIA, B12 DEFICIENCY 12/02/2008  . Arthritis   . CAD (coronary artery disease)    a.  s/p CABG 2011.  Marland Kitchen CAROTID ARTERY STENOSIS 04/16/2009  . Cataract   . Chronic diastolic CHF (congestive heart failure) ([place])   . Chronic respiratory failure ([place])   . COLONIC POLYPS 05/21/2007  . COPD, severe ([place])   . Diabetes mellitus without complication ([place])   .  DIVERTICULAR DISEASE 05/21/2007  . Essential hypertension   . Falls   . GERD (gastroesophageal reflux disease)   . GI BLEEDING 09/09/2008  . Hemoptysis   . HIATAL HERNIA 05/21/2007  . History of kidney stones   . HYPERLIPIDEMIA 11/21/2006  . Internal hemorrhoids   . MYOCARDIAL INFARCTION, HX OF 11/21/2006  . NEPHROLITHIASIS 05/21/2007  . Normocytic anemia   . NSVT (nonsustained ventricular tachycardia) ([place])    a. Holter 2015: NSR, A. Fib/flutter, short runs of NSVT  . OSTEOPOROSIS 11/21/2006  . Osteoporosis   . Paroxysmal atrial fibrillation ([place])    a. Anticoag stopped due to falls, rectal bleeding.  . Paroxysmal atrial flutter ([place])   . Pneumonia   . Pulmonary nodule    a. Dx 01/2015 -> advised to f/u PCP for PET.  Marland Kitchen PVD (peripheral vascular disease) ([place])    a. s/p prior iliac stenting.    Patient Active Problem List   Diagnosis Date Noted  . COPD with acute exacerbation ([place]) 06/10/2016  . Chest pain 02/23/2016  . Lung nodule, solitary   . HCAP (healthcare-associated pneumonia)   . Iron deficiency anemia 02/15/2015  . CAD (coronary artery disease) 09/18/2014  . Compression fracture of L1 lumbar vertebra ([facility])   . Syncope 08/23/2014  . Low back pain 08/23/2014  . Hypertension 08/23/2014  . COPD (chronic obstructive pulmonary disease) ([place]) 08/23/2014  . Chronic atrial fibrillation ([place]) 08/23/2014  . Syncope and collapse   .  Olecranon bursitis 07/09/2014  . Diabetes mellitus type II, controlled ([place]) 07/01/2014  . Chronic anticoagulation 05/29/2014  . Acute on chronic respiratory failure with hypoxia ([place]) 05/14/2014  . Diastolic CHF ([place]) 27/05/5007  . COPD exacerbation ([place])   . CAP (community acquired pneumonia) 04/03/2014  . Allergic rhinitis 02/06/2014  . GERD (gastroesophageal reflux disease) 02/06/2014  . Do not resuscitate 02/06/2014  . Erectile dysfunction 06/18/2010  . Atrial fibrillation ([place]) 11/06/2009  . Peripheral vascular disease ([place]) 09/02/2009  .  Carotid artery stenosis 04/16/2009  . ANEMIA, B12 DEFICIENCY 12/02/2008  . COPD GOLD III 05/21/2007  . TOBACCO USE 01/19/2007  . Hyperlipidemia 11/21/2006  . Essential hypertension 11/21/2006  . Hx of CABG 11/21/2006  . Osteoporosis 11/21/2006    Past Surgical History:  Procedure Laterality Date  . APPENDECTOMY    . CARDIOVERSION N/A 04/16/2014   Procedure: CARDIOVERSION;  Surgeon: Josue Hector, MD;  Location[facility] ENDOSCOPY;  Service: Cardiovascular;  Laterality: N/A;  . CHOLECYSTECTOMY    . CORONARY ARTERY BYPASS GRAFT  2011  . CORONARY STENT PLACEMENT     03/07/2001  . HEMORRHOID SURGERY    . INGUINAL HERNIA REPAIR  10/10/08  . MASS EXCISION Left 07/09/2014   Procedure: LEFT ELBOW BURSECTOMY EXCISION MASS;  Surgeon: Iran Planas, MD;  Location: [facility];  Service: Orthopedics;  Laterality: Left;  . OLECRANON BURSECTOMY  07/09/2014  . PTCA    . THYROIDECTOMY     partial       Home Medications    Prior to Admission medications   Medication Sig Start Date End Date Taking? Authorizing Provider  acetaminophen (TYLENOL) 500 MG tablet Take 500 mg by mouth every 6 (six) hours as needed for mild pain or moderate pain.   Yes Historical Provider, MD  albuterol (PROAIR HFA) 108 (90 Base) MCG/ACT inhaler Inhale 2 puffs into the lungs every 4 (four) hours as needed for wheezing or shortness of breath.   Yes Historical Provider, MD  albuterol (PROVENTIL) (2.5 MG/3ML) 0.083% nebulizer solution Take 2.5 mg by nebulization every 6 (six) hours as needed for wheezing or shortness of breath.   Yes Historical Provider, MD  atorvastatin (LIPITOR) 40 MG tablet Take 40 mg by mouth daily.   Yes Historical Provider, MD  budesonide-formoterol (SYMBICORT) 160-4.5 MCG/ACT inhaler Inhale 2 puffs into the lungs 2 (two) times daily.   Yes Historical Provider, MD  busPIRone (BUSPAR) 7.5 MG tablet Take 7.5 mg by mouth 2 (two) times daily.    Yes Historical Provider, MD  calcium carbonate (TUMS - DOSED IN MG  ELEMENTAL CALCIUM) 500 MG chewable tablet Chew 1 tablet by mouth 2 (two) times daily as needed for indigestion or heartburn.   Yes Historical Provider, MD  Calcium Carbonate-Vit D-Min (CALTRATE 600+D PLUS) 600-400 MG-UNIT per tablet Chew 1 tablet by mouth daily.     Yes Historical Provider, MD  cetirizine (ZYRTEC ALLERGY) 10 MG tablet Take 1 tablet (10 mg total) by mouth every morning. 08/17/10  Yes Tammy S Parrett, NP  clonazePAM (KLONOPIN) 0.5 MG tablet Take 0.25 mg by mouth at bedtime.   Yes Historical Provider, MD  dextromethorphan-guaiFENesin (MUCINEX DM) 30-600 MG 12hr tablet Take 1 tablet by mouth 2 (two) times daily. Patient taking differently: Take 1 tablet by mouth at bedtime.  02/24/16  Yes Maryann Mikhail, DO  diltiazem (CARDIZEM CD) 240 MG 24 hr capsule Take 1 capsule (240 mg total) by mouth daily. 01/21/16  Yes Sherren Mocha, MD  ferrous gluconate (FERGON) 324 MG tablet  Take 324 mg by mouth 2 (two) times daily with a meal.   Yes Historical Provider, MD  furosemide (LASIX) 40 MG tablet TAKE 1 TABLET BY MOUTH EVERY DAY Patient taking differently: TAKE 40 MG BY MOUTH EVERY DAY 10/20/14  Yes Burtis Junes, NP  insulin detemir (LEVEMIR) 100 UNIT/ML injection Inject 3-5 Units into the skin See admin instructions. Before evening meal (dinner) AS NEEDED   Yes Historical Provider, MD  insulin lispro (HUMALOG) 100 UNIT/ML injection Inject 3-6 Units into the skin 3 (three) times daily as needed for high blood sugar.    Yes Historical Provider, MD  OXYGEN Inhale 2-4 L into the lungs See admin instructions. Inhale 2 L continuous during the day and 4 L at night for sleep   Yes Historical Provider, MD  pantoprazole (PROTONIX) 40 MG tablet TAKE 1 TABLET BY MOUTH TWICE A DAY Patient taking differently: TAKE 80 MG BY MOUTH DAILY 05/19/14  Yes Marin Olp, MD  potassium chloride SA (K-DUR,KLOR-CON) 20 MEQ tablet Take 1 tablet (20 mEq total) by mouth daily. 03/17/14  Yes Burtis Junes, NP  SPIRIVA  HANDIHALER 18 MCG inhalation capsule INHALE CONTENTS OF 1 CAPSULE DAILY 01/24/14  Yes Lisabeth Pick, MD  aspirin EC 81 MG EC tablet Take 1 tablet (81 mg total) by mouth daily. Patient not taking: Reported on 06/10/2016 02/25/16   Maryann Mikhail, DO  azithromycin (ZITHROMAX) 250 MG tablet Take 1 tablet (250 mg total) by mouth daily. Patient not taking: Reported on 06/10/2016 02/24/16   Velta Addison Mikhail, DO  predniSONE (DELTASONE) 10 MG tablet Take 1 tablet (10 mg total) by mouth daily with breakfast. Take 40mg  (4 tabs) x 3 days, then taper to 30mg  (3 tabs) x 3 days, then 20mg  (2 tabs) x 3days, then 10mg  (1 tab) x 3days, then stop. Patient not taking: Reported on 06/10/2016 02/24/16   Cristal Ford, DO    Family History Family History  Problem Relation Age of Onset  . Arthritis Mother     rheumatoid  . Heart attack Father   . Heart disease Father   . Heart disease Brother   . Colon cancer Brother 34  . Heart disease Brother   . Stomach cancer Neg Hx     Social History Social History  Substance Use Topics  . Smoking status: Former Smoker    Packs/day: 3.00    Years: 50.00    Types: Cigarettes    Quit date: 09/25/2009  . Smokeless tobacco: Never Used  . Alcohol use 3.0 oz/week    6 Standard drinks or equivalent per week     Comment: [place] 2-3 a night     Allergies   Ticlopidine hcl; Lorazepam; and Other   Review of Systems Review of Systems  All other systems reviewed and are negative.    Physical Exam Updated Vital Signs BP 114/65   Pulse 75   Temp 98.2 F (36.8 C) (Oral)   Resp (!) 26   SpO2 97%   Physical Exam  Constitutional: He is oriented to person, place, and time. He appears well-developed and well-nourished.  HENT:  Head: Normocephalic and atraumatic.  Eyes: Conjunctivae are normal. Pupils are equal, round, and reactive to light. Right eye exhibits no discharge. Left eye exhibits no discharge. No scleral icterus.  Neck: Normal range of motion. No JVD  present. No tracheal deviation present.  Cardiovascular: Normal rate and regular rhythm.   Pulmonary/Chest: Effort normal. No stridor. He has wheezes. He has rales. He  exhibits no tenderness.  Prolonged expiration with wheeze bilateral  Musculoskeletal: He exhibits no edema.  Neurological: He is alert and oriented to person, place, and time. Coordination normal.  Skin: Skin is warm.  Psychiatric: He has a normal mood and affect. His behavior is normal. Judgment and thought content normal.  Nursing note and vitals reviewed.    ED Treatments / Results  Labs (all labs ordered are listed, but only abnormal results are displayed) Labs Reviewed  BASIC METABOLIC PANEL - Abnormal; Notable for the following:       Result Value   Chloride 99 (*)    Glucose, Bld 305 (*)    Calcium 8.8 (*)    All other components within normal limits  CBC - Abnormal; Notable for the following:    MCV 100.8 (*)    Platelets 121 (*)    All other components within normal limits  URINALYSIS, ROUTINE W REFLEX MICROSCOPIC - Abnormal; Notable for the following:    Glucose, UA 50 (*)    Hgb urine dipstick MODERATE (*)    All other components within normal limits  I-STAT CG4 LACTIC ACID, ED - Abnormal; Notable for the following:    Lactic Acid, Venous 2.24 (*)    All other components within normal limits  CULTURE, BLOOD (ROUTINE X 2)  CULTURE, BLOOD (ROUTINE X 2)  I-STAT TROPOININ, ED  I-STAT CG4 LACTIC ACID, ED    EKG  EKG Interpretation None       Radiology Dg Chest 2 View  Result Date: 06/10/2016 CLINICAL DATA:  Increasing shortness of breath. EXAM: CHEST  2 VIEW COMPARISON:  02/22/2016 FINDINGS: Lungs are hyperexpanded. Interstitial markings are diffusely coarsened with chronic features. Small scattered areas of irregular small a moderate hiatal hernia. Focal lung opacity are identified bilaterally. The cardiopericardial silhouette is within normal limits for size. Patient is status post CABG.  Degenerative changes noted right AC joint. IMPRESSION: Emphysema with scattered irregular tiny focal areas of airspace opacity. This may be related to multifocal pneumonia or infectious/ inflammatory alveolitis. Follow-up imaging is recommended to ensure resolution as metastatic disease could have this appearance. Emphysema with chronic interstitial coarsening. Small to moderate hiatal hernia. Electronically Signed   By: Misty Stanley M.D.   On: 06/10/2016 11:50    Procedures Procedures (including critical care time)  CRITICAL CARE Performed by: Elmer Ramp   Total critical care time: 35 minutes  Critical care time was exclusive of separately billable procedures and treating other patients.  Critical care was necessary to treat or prevent imminent or life-threatening deterioration.  Critical care was time spent personally by me on the following activities: development of treatment plan with patient and/or surrogate as well as nursing, discussions with consultants, evaluation of patient's response to treatment, examination of patient, obtaining history from patient or surrogate, ordering and performing treatments and interventions, ordering and review of laboratory studies, ordering and review of radiographic studies, pulse oximetry and re-evaluation of patient's condition.  Medications Ordered in ED Medications  azithromycin (ZITHROMAX) 500 mg in dextrose 5 % 250 mL IVPB (0 mg Intravenous Stopped 06/10/16 1451)  cefTRIAXone (ROCEPHIN) 1 g in dextrose 5 % 50 mL IVPB (0 g Intravenous Stopped 06/10/16 1312)  methylPREDNISolone sodium succinate (SOLU-MEDROL) 125 mg/2 mL injection 125 mg (not administered)  atorvastatin (LIPITOR) tablet 40 mg (not administered)  mometasone-formoterol (DULERA) 200-5 MCG/ACT inhaler 2 puff (not administered)  dextromethorphan-guaiFENesin (MUCINEX DM) 30-600 MG per 12 hr tablet 1 tablet (not administered)  tiotropium (SPIRIVA) inhalation capsule 18  mcg (not  administered)  aspirin EC tablet 81 mg (not administered)  ipratropium-albuterol (DUONEB) 0.5-2.5 (3) MG/3ML nebulizer solution 3 mL (3 mLs Nebulization Given 06/10/16 1231)  sodium chloride 0.9 % bolus 1,000 mL (0 mLs Intravenous Stopped 06/10/16 1451)    And  sodium chloride 0.9 % bolus 1,000 mL (0 mLs Intravenous Stopped 06/10/16 1451)    And  sodium chloride 0.9 % bolus 250 mL (0 mLs Intravenous Stopped 06/10/16 1451)  methylPREDNISolone sodium succinate (SOLU-MEDROL) 125 mg/2 mL injection 125 mg (125 mg Intravenous Given 06/10/16 1329)  albuterol (PROVENTIL,VENTOLIN) solution continuous neb (10 mg/hr Nebulization Given 06/10/16 1352)     Initial Impression / Assessment and Plan / ED Course  I have reviewed the triage vital signs and the nursing notes.  Pertinent labs & imaging results that were available during my care of the patient were reviewed by me and considered in my medical decision making (see chart for details).      Final Clinical Impressions(s) / ED Diagnoses   Final diagnoses:  Sepsis, due to unspecified organism [facility])  Community acquired pneumonia, unspecified laterality    Labs: I-STAT troponin, BMP CBC  Imaging: DG Chest 2 View  Consults:  Therapeutics: DuoNeb, ceftriaxone, azithromycin  Discharge Meds:   Assessment/Plan: 76 year old male presents today with likely community-acquired pneumonia.  Chest x-ray consistent with his physical exam.  Worsening productive cough over the last week.  Patient is afebrile, but does have several blood pressure readings showing hypotensive and tachypnea.  Sepsis order set was initiated with weight-based fluid resuscitation, ceftriaxone and azithromycin, code sepsis.  Patient will need hospital admission for ongoing management.  Pt is not requiring additional 02 supplementation over baseline 2 liters. Pt had reported CP last night, none presently, Trop neagtive, likely secondary to infection.    New Prescriptions New  Prescriptions   No medications on file     Okey Regal, PA-C 06/10/16 [place], MD 06/10/16 2040

## 2016-06-10 NOTE — H&P (Signed)
Date: 06/10/2016               Patient Name:  [name] MRN: [id number]  DOB: Apr 15, 1940 Age / Sex: 76 y.o., male   PCP: Reubin Milan, MD         Medical Service: Internal Medicine Teaching Service         Attending Physician: Dr. Truman Hayward, MD    First Contact: Dr. Einar Gip Pager: [phone]  Second Contact: Dr. Ignacia Marvel Pager: [phone]       After Hours (After 5p/  First Contact Pager: [phone]  weekends / holidays): Second Contact Pager: [phone]   Chief Complaint: productive cough, shortness of breath  History of Present Illness:  [name] is a 76 year old male with MHx significant for COPD on chronic O2 (2L during day, 4L at night), DM2, PAF, dCHF who presents for evaluation of a 1 week history of progressive malaise, cough, increased shortness of breath and decreased appetite. He also endorses sore throat and chills. Cough is productive of green sputum. He also reports his blood sugars have been higher than normal for the past few days as well. He denies any sick contacts, recent hospitalizations or prior history of similar episodes. He denies any fever, abdominal pain however did admit to some loose stools over the past week as well. Denied any recent antibiotic use. Denied any dysuria or hematuria however endorsed frequency over the last week. Did have some anterior chest pain last night however this has resolved. His medications are managed by his son, with who he lives. He reports his son is very good at giving the pt his medications and inhalers and he does not believe hes missed any doses of these.   In the ED temp was 98.2*, pulse 77, BP 108/54, respirations were 25 and he was saturating 94% via 3L . BMET showed hyperglycemia at 305 however was otherwise normal. CBC without leukocytosis or anemia. Troponin 0.01. Lactic acid 2.24. 2-view CXR obtained showed emphysema with scattered irregular tiny foci of airspace opacity, concerning for a multifocal PNA  vs inflammatory alveolitis. Follow-up imaging was recommended as this is concerning for metastatic disease. Code sepsis was called in ED and he was subsequently started on IV Ceftriaxone and Azithromycin as well as received 3L NS.   Meds:  Current Meds  Medication Sig  . acetaminophen (TYLENOL) 500 MG tablet Take 500 mg by mouth every 6 (six) hours as needed for mild pain or moderate pain.  Marland Kitchen albuterol (PROAIR HFA) 108 (90 Base) MCG/ACT inhaler Inhale 2 puffs into the lungs every 4 (four) hours as needed for wheezing or shortness of breath.  Marland Kitchen albuterol (PROVENTIL) (2.5 MG/3ML) 0.083% nebulizer solution Take 2.5 mg by nebulization every 6 (six) hours as needed for wheezing or shortness of breath.  Marland Kitchen atorvastatin (LIPITOR) 40 MG tablet Take 40 mg by mouth daily.  . budesonide-formoterol (SYMBICORT) 160-4.5 MCG/ACT inhaler Inhale 2 puffs into the lungs 2 (two) times daily.  . busPIRone (BUSPAR) 7.5 MG tablet Take 7.5 mg by mouth 2 (two) times daily.   . calcium carbonate (TUMS - DOSED IN MG ELEMENTAL CALCIUM) 500 MG chewable tablet Chew 1 tablet by mouth 2 (two) times daily as needed for indigestion or heartburn.  . Calcium Carbonate-Vit D-Min (CALTRATE 600+D PLUS) 600-400 MG-UNIT per tablet Chew 1 tablet by mouth daily.    . cetirizine (ZYRTEC ALLERGY) 10 MG tablet Take 1 tablet (10 mg total) by mouth  every morning.  . clonazePAM (KLONOPIN) 0.5 MG tablet Take 0.25 mg by mouth at bedtime.  Marland Kitchen dextromethorphan-guaiFENesin (MUCINEX DM) 30-600 MG 12hr tablet Take 1 tablet by mouth 2 (two) times daily. (Patient taking differently: Take 1 tablet by mouth at bedtime. )  . diltiazem (CARDIZEM CD) 240 MG 24 hr capsule Take 1 capsule (240 mg total) by mouth daily.  . ferrous gluconate (FERGON) 324 MG tablet Take 324 mg by mouth 2 (two) times daily with a meal.  . furosemide (LASIX) 40 MG tablet TAKE 1 TABLET BY MOUTH EVERY DAY (Patient taking differently: TAKE 40 MG BY MOUTH EVERY DAY)  . insulin detemir  (LEVEMIR) 100 UNIT/ML injection Inject 3-5 Units into the skin See admin instructions. Before evening meal (dinner) AS NEEDED  . insulin lispro (HUMALOG) 100 UNIT/ML injection Inject 3-6 Units into the skin 3 (three) times daily as needed for high blood sugar.   . OXYGEN Inhale 2-4 L into the lungs See admin instructions. Inhale 2 L continuous during the day and 4 L at night for sleep  . pantoprazole (PROTONIX) 40 MG tablet TAKE 1 TABLET BY MOUTH TWICE A DAY (Patient taking differently: TAKE 80 MG BY MOUTH DAILY)  . potassium chloride SA (K-DUR,KLOR-CON) 20 MEQ tablet Take 1 tablet (20 mEq total) by mouth daily.  Marland Kitchen SPIRIVA HANDIHALER 18 MCG inhalation capsule INHALE CONTENTS OF 1 CAPSULE DAILY   Allergies: Allergies as of 06/10/2016 - Review Complete 06/10/2016  Allergen Reaction Noted  . Xarelto [rivaroxaban] Other (See Comments) 03/17/2014  . Ticlopidine hcl Swelling   . Lorazepam Other (See Comments) 05/15/2014   Past Medical History:  Diagnosis Date  . Alcohol abuse   . Allergic rhinitis   . ANEMIA, B12 DEFICIENCY 12/02/2008  . Arthritis   . CAD (coronary artery disease)    a.  s/p CABG 2011.  Marland Kitchen CAROTID ARTERY STENOSIS 04/16/2009  . Cataract   . Chronic diastolic CHF (congestive heart failure) ([place])   . Chronic respiratory failure ([place])   . COLONIC POLYPS 05/21/2007  . COPD, severe ([place])   . Diabetes mellitus without complication ([place])   . DIVERTICULAR DISEASE 05/21/2007  . Essential hypertension   . Falls   . GERD (gastroesophageal reflux disease)   . GI BLEEDING 09/09/2008  . Hemoptysis   . HIATAL HERNIA 05/21/2007  . History of kidney stones   . HYPERLIPIDEMIA 11/21/2006  . Internal hemorrhoids   . MYOCARDIAL INFARCTION, HX OF 11/21/2006  . NEPHROLITHIASIS 05/21/2007  . Normocytic anemia   . NSVT (nonsustained ventricular tachycardia) ([place])    a. Holter 2015: NSR, A. Fib/flutter, short runs of NSVT  . OSTEOPOROSIS 11/21/2006  . Osteoporosis   . Paroxysmal atrial fibrillation  ([place])    a. Anticoag stopped due to falls, rectal bleeding.  . Paroxysmal atrial flutter ([place])   . Pneumonia   . Pulmonary nodule    a. Dx 01/2015 -> advised to f/u PCP for PET.  Marland Kitchen PVD (peripheral vascular disease) ([place])    a. s/p prior iliac stenting.   Family History: Mother deceased, with rheumatoid arthritis. Father, deceased with MI. Brother has heart disease and colon cancer.   Social History: He's a former smoker with a 150 pack year history. He quit in 2011. Has never used smokeless tobacco. He denied any recent alcohol use. Denied drug use. He lives at home with his son.   Review of Systems: A complete ROS was negative except as per HPI.   Physical Exam: Blood pressure 112/70, pulse  78, temperature 98.2 F (36.8 C), temperature source Oral, resp. rate (!) 25, SpO2 94 %. General: Elderly and chronically-ill appearing caucasian man. Resting upright in bed. In no acute distress although does appear ill.  HENT: PERRL. EOMI. No conjunctival injection, icterus or ptosis. Oropharynx clear, mucous membranes moist. Pharyngeal erythema without exudate.  Cardiovascular: Irregularly irregular but otherwise normal. No murmur or rub appreciated. Pulmonary: Scattered crackles. No wheezing appreciated.  Frequent wet coughing. Not in respiratory distress although speaks in short 2-3 word phrases.  Abdomen: Soft, non-tender and non-distended. No guarding or rigidity. +bowel sounds.  Extremities: No peripheral edema noted BL. Intact distal pulses. No gross deformities. Skin: Warm, dry. No cyanosis. Diffuse solar lentigines and actinic keratoses present on scalp and arms.  Neuro: Strength and sensation grossly intact. Alert and oriented x3.  Psych: Mood normal and affect was mood congruent. Responds to questions appropriately.   EKG: Atrial fibrillation with rate of approximately 86.   CXR: Emphysema with scattered irregular tiny focal areas of airspace opacity. This may be related to multifocal  pneumonia or infectious/ inflammatory alveolitis. Follow-up imaging is recommended to ensure resolution as metastatic disease could have this appearance.  Assessment & Plan by Problem: Active Problems:   Hx of CABG   Acute on chronic respiratory failure with hypoxia (HCC)   COPD with acute exacerbation (HCC)  COPD Exacerbation, ?CAP?, Chronic Respiratory Failure with Hypoxia: This is a 76 year old male with COPD on continuous oxygen who presents with a 5 day history of progressive fatigue, cough with increased sputum, and shortness of breath. He also endorses requiring additional oxygen while at home. He denies any sick contacts and denies fever at home. No leukocytosis today however does have mildly elevated lactic acid. CXR showed emphysema however concerning for multifocal airspace opacities which could reflect a multifocal pneumonia vs possible metastatic disease. Chart review shows a history of an 8 mm left upper lobe pulmonary nodule however pt reports this is being followed by his PCP. I believe his 5 day history is most likely due to COPD exacerbation, with either a viral vs bacterial etiology. ?CAP? Will obtain Pct to help clarify. Was given empiric CAP abx by ED with Ceftriaxone and Azithromycin.  -Most recent PFTs 02/2012 show evidence for severe COPD.  -Pct -Solumedrol 125 mg Qdaily. Will consider transitioning to PO Pred tomorrow -IV Ceftriaxone + Azithromycin. Will consider narrowing based on patients  -Scheduled Duonebs.  Ruthe Mannan - Holding home Symbicort and Spiriva.  -Dextromethorphan-guaifenesin 30-600 BID -Tylenol prn fever -O2 to keep sat ~90% -Albuterol PRN  Type 2 Diabetes: Most recent HbA1c 7.2% 01/2016. He takes Levemir 3-5 units QHS + 3-6 units of meal-time insulin. Reports blood sugars have been elevated for the past few days. This is likely due to the above.  -CBG checks -Sliding scale insulin   Atrial Fibrillation: CHADs2VASc score = 5. He was on anticoagulation  in the past but reports this was discontinued due to frequent falls. He takes ASA 81 mg and is rate controlled currently with diltiazem 240 mg daily. In atrial fibrillation on examination.  -Continue ASA 81 mg  dCHF: Appears euvolemic on examination today. Most recent ECHo 01/2016 showed LVEF of 55-60% with evidence for pulmonary hypertension. Compliant with Lasix 40 mg daily.  -Holding lasix in setting of infection  CAD, HLD: On Atorvastatin 40 mg daily. This will be continued. ASA 81 mg as above.   Code status: DNR. This was confirmed with the patient upon admission. He would like his  SON contacted in case of an emergency.  IVF: s/p 3L ns in ED.  Diet: HH/Carb mod DVT Prophylaxis: Lovenox  Dispo: Admit patient to Inpatient with expected length of stay greater than 2 midnights.   SignedEinar Gip, DO 06/10/2016, 1:17 PM  Pager: [phone]

## 2016-06-10 NOTE — ED Notes (Signed)
Helped patient off of bedside and back in to bed

## 2016-06-11 DIAGNOSIS — Z87891 Personal history of nicotine dependence: Secondary | ICD-10-CM

## 2016-06-11 DIAGNOSIS — R918 Other nonspecific abnormal finding of lung field: Secondary | ICD-10-CM

## 2016-06-11 DIAGNOSIS — I5032 Chronic diastolic (congestive) heart failure: Secondary | ICD-10-CM

## 2016-06-11 DIAGNOSIS — Z888 Allergy status to other drugs, medicaments and biological substances status: Secondary | ICD-10-CM

## 2016-06-11 DIAGNOSIS — E1165 Type 2 diabetes mellitus with hyperglycemia: Secondary | ICD-10-CM

## 2016-06-11 DIAGNOSIS — Z7951 Long term (current) use of inhaled steroids: Secondary | ICD-10-CM

## 2016-06-11 DIAGNOSIS — Z8249 Family history of ischemic heart disease and other diseases of the circulatory system: Secondary | ICD-10-CM

## 2016-06-11 DIAGNOSIS — Z79899 Other long term (current) drug therapy: Secondary | ICD-10-CM

## 2016-06-11 DIAGNOSIS — Z800 Family history of malignant neoplasm of digestive organs: Secondary | ICD-10-CM

## 2016-06-11 DIAGNOSIS — I2510 Atherosclerotic heart disease of native coronary artery without angina pectoris: Secondary | ICD-10-CM

## 2016-06-11 DIAGNOSIS — Z66 Do not resuscitate: Secondary | ICD-10-CM

## 2016-06-11 DIAGNOSIS — J9611 Chronic respiratory failure with hypoxia: Secondary | ICD-10-CM

## 2016-06-11 DIAGNOSIS — E785 Hyperlipidemia, unspecified: Secondary | ICD-10-CM

## 2016-06-11 DIAGNOSIS — Z8261 Family history of arthritis: Secondary | ICD-10-CM

## 2016-06-11 LAB — BASIC METABOLIC PANEL
ANION GAP: 9 (ref 5–15)
BUN: 11 mg/dL (ref 6–20)
CALCIUM: 8.1 mg/dL — AB (ref 8.9–10.3)
CO2: 28 mmol/L (ref 22–32)
CREATININE: 0.81 mg/dL (ref 0.61–1.24)
Chloride: 104 mmol/L (ref 101–111)
Glucose, Bld: 216 mg/dL — ABNORMAL HIGH (ref 65–99)
Potassium: 4.1 mmol/L (ref 3.5–5.1)
Sodium: 141 mmol/L (ref 135–145)

## 2016-06-11 LAB — GLUCOSE, CAPILLARY
GLUCOSE-CAPILLARY: 231 mg/dL — AB (ref 65–99)
Glucose-Capillary: 156 mg/dL — ABNORMAL HIGH (ref 65–99)
Glucose-Capillary: 272 mg/dL — ABNORMAL HIGH (ref 65–99)
Glucose-Capillary: 135 mg/dL — ABNORMAL HIGH (ref 65–99)

## 2016-06-11 LAB — CBC
HCT: 42.6 % (ref 39.0–52.0)
HEMOGLOBIN: 13.7 g/dL (ref 13.0–17.0)
MCH: 32.2 pg (ref 26.0–34.0)
MCHC: 32.2 g/dL (ref 30.0–36.0)
MCV: 100 fL (ref 78.0–100.0)
PLATELETS: 122 10*3/uL — AB (ref 150–400)
RBC: 4.26 MIL/uL (ref 4.22–5.81)
RDW: 14.2 % (ref 11.5–15.5)
WBC: 4 10*3/uL (ref 4.0–10.5)

## 2016-06-11 MED ORDER — PHENOL 1.4 % MT LIQD
1.0000 | OROMUCOSAL | Status: DC | PRN
  Administered 2016-06-11: 1 via OROMUCOSAL
  Filled 2016-06-11: qty 177

## 2016-06-11 MED ORDER — CLONAZEPAM 0.5 MG PO TABS
0.2500 mg | ORAL_TABLET | Freq: Every day | ORAL | Status: DC
  Administered 2016-06-11 – 2016-06-12 (×2): 0.25 mg via ORAL
  Administered 2016-06-13: 0.5 mg via ORAL
  Filled 2016-06-11 (×3): qty 1

## 2016-06-11 MED ORDER — IPRATROPIUM-ALBUTEROL 0.5-2.5 (3) MG/3ML IN SOLN
3.0000 mL | Freq: Three times a day (TID) | RESPIRATORY_TRACT | Status: DC
  Administered 2016-06-11 – 2016-06-14 (×10): 3 mL via RESPIRATORY_TRACT
  Filled 2016-06-11 (×10): qty 3

## 2016-06-11 MED ORDER — IPRATROPIUM-ALBUTEROL 0.5-2.5 (3) MG/3ML IN SOLN
3.0000 mL | Freq: Four times a day (QID) | RESPIRATORY_TRACT | Status: DC | PRN
  Administered 2016-06-11 (×2): 3 mL via RESPIRATORY_TRACT
  Filled 2016-06-11 (×2): qty 3

## 2016-06-11 MED ORDER — WHITE PETROLATUM GEL
Status: AC
  Administered 2016-06-11: 18:00:00
  Filled 2016-06-11: qty 1

## 2016-06-11 MED ORDER — PREDNISONE 20 MG PO TABS
40.0000 mg | ORAL_TABLET | Freq: Every day | ORAL | Status: DC
  Administered 2016-06-11 – 2016-06-13 (×3): 40 mg via ORAL
  Filled 2016-06-11 (×3): qty 2

## 2016-06-11 MED ORDER — AZITHROMYCIN 500 MG PO TABS
500.0000 mg | ORAL_TABLET | Freq: Every day | ORAL | Status: AC
  Administered 2016-06-11 – 2016-06-13 (×3): 500 mg via ORAL
  Filled 2016-06-11 (×3): qty 1

## 2016-06-11 NOTE — Evaluation (Signed)
Physical Therapy Evaluation Patient Details Name: [name] MRN: [id number] DOB: 02-Jul-1940 Today's Date: 06/11/2016   History of Present Illness  Pt is a 76 y/o male admitted secondary to SOB with productive, ?CAP. PMH including but not limited to COPD (O2 dependent, 2L during the day and 4L at night), CHF, CAD s/p CABG in 2011, DM, HTN, PAF and PAD.  Clinical Impression  Pt presented supine in bed with HOB elevated, awake and wiling to participate in therapy session. Prior to admission, pt reported that he was independent with dressing and bathing, and that he ambulates with use of either a SPC, RW or "furniture surfs" inside his home. Pt has a w/c for community mobility. Pt currently ambulating with min A using one person HHA on 3L of supplemental O2 with all VSS throughout. Pt would continue to benefit from skilled physical therapy services at this time while admitted and after d/c to address his below listed limitations in order to improve his overall safety and independence with functional mobility.      Follow Up Recommendations Home health PT    Equipment Recommendations  None recommended by PT    Recommendations for Other Services       Precautions / Restrictions Precautions Precautions: Fall Restrictions Weight Bearing Restrictions: No      Mobility  Bed Mobility Overal bed mobility: Modified Independent                Transfers Overall transfer level: Needs assistance Equipment used: 1 person hand held assist Transfers: Sit to/from Stand Sit to Stand: Min assist         General transfer comment: increased time, min A for stability with transition from bed  Ambulation/Gait Ambulation/Gait assistance: Min assist Ambulation Distance (Feet): 10 Feet Assistive device: 1 person hand held assist Gait Pattern/deviations: Step-through pattern;Decreased step length - right;Decreased step length - left;Decreased stride length;Shuffle;Narrow base of support;Trunk  flexed Gait velocity: decreased Gait velocity interpretation: Below normal speed for age/gender General Gait Details: modest instability without use of an AD, min A with one HHA  Stairs            Wheelchair Mobility    Modified Rankin (Stroke Patients Only)       Balance Overall balance assessment: Needs assistance Sitting-balance support: Feet supported Sitting balance-Leahy Scale: Good     Standing balance support: During functional activity;Single extremity supported Standing balance-Leahy Scale: Poor Standing balance comment: pt reliant on at least one UE support                             Pertinent Vitals/Pain Pain Assessment: No/denies pain    Home Living Family/patient expects to be discharged to:: Private residence Living Arrangements: Children Available Help at Discharge: Family;Available 24 hours/day Type of Home: House Home Access: Ramped entrance     Home Layout: One level Home Equipment: Walker - 4 wheels;Walker - 2 wheels;Cane - single point;Shower seat;Wheelchair - manual Additional Comments: Home O2- 2L during the day and 4L at night    Prior Function Level of Independence: Independent with assistive device(s)   Gait / Transfers Assistance Needed: ambulates distances within his home using either a SPC, RW or "furniture surfing"           Hand Dominance        Extremity/Trunk Assessment   Upper Extremity Assessment Upper Extremity Assessment: Overall WFL for tasks assessed    Lower Extremity Assessment Lower Extremity  Assessment: Generalized weakness       Communication   Communication: No difficulties  Cognition Arousal/Alertness: Awake/alert Behavior During Therapy: WFL for tasks assessed/performed Overall Cognitive Status: Within Functional Limits for tasks assessed                      General Comments      Exercises     Assessment/Plan    PT Assessment Patient needs continued PT services  PT  Problem List Decreased strength;Decreased activity tolerance;Decreased balance;Decreased mobility;Decreased coordination;Decreased knowledge of use of DME;Decreased safety awareness;Decreased knowledge of precautions;Cardiopulmonary status limiting activity       PT Treatment Interventions DME instruction;Gait training;Functional mobility training;Therapeutic activities;Therapeutic exercise;Balance training;Neuromuscular re-education;Patient/family education    PT Goals (Current goals can be found in the Care Plan section)  Acute Rehab PT Goals Patient Stated Goal: return home PT Goal Formulation: With patient Time For Goal Achievement: 06/25/16 Potential to Achieve Goals: Good    Frequency Min 3X/week   Barriers to discharge        Co-evaluation               End of Session Equipment Utilized During Treatment: Gait belt;Oxygen (3L of O2) Activity Tolerance: Patient tolerated treatment well Patient left: in chair;with call bell/phone within reach Nurse Communication: Mobility status PT Visit Diagnosis: Unsteadiness on feet (R26.81);Other abnormalities of gait and mobility (R26.89)         Time: 5366-4403 PT Time Calculation (min) (ACUTE ONLY): 13 min   Charges:   PT Evaluation $PT Eval Moderate Complexity: 1 Procedure     PT G CodesClearnce Sorrel Jacai Kipp 06/11/2016, 9:14 AM Sherie Don, PT, DPT [phone]

## 2016-06-11 NOTE — Progress Notes (Signed)
Pt transferred to 6E07 via bed. Report called, all questions answered. Vital signs stable. All belonging taken with patient.

## 2016-06-11 NOTE — Progress Notes (Signed)
Received pt as transfer tonight around 2100. Pt is alert and oriented and stable. All vitals are WNL. Denies pain.  Eleanora Neighbor, RN

## 2016-06-11 NOTE — Progress Notes (Signed)
   Subjective: Mr. Colson was seen and evaluated today at bedside while he was eating breakfast. He reports he feels much better from admission. No longer SOB and his breathing feels back to baseline however he is currently on 3L O2, which is more than his home dose.   Objective:  Vital signs in last 24 hours: Vitals:   06/11/16 0400 06/11/16 0700 06/11/16 0744 06/11/16 0755  BP: 129/76 140/86 140/86 (!) 150/100  Pulse: 74 (!) 41 83 96  Resp: (!) 24 (!) 25 (!) 23 20  Temp: 98.3 F (36.8 C)   97.4 F (36.3 C)  TempSrc: Oral   Oral  SpO2: 98% 98% 98% 97%  Weight:      Height:       General: Elderly and chronically ill appearing however In no acute distress. Resting comfortably in bedside recliner.  HENT: EOMI. No conjunctival injection, icterus or ptosis.  Cardiovascular: IR, IR. No murmur or rub appreciated. Pulmonary: Some scattered wheezes throughout however improved from yesterday. Unlabored breathing. Able to speak in complete sentences without dyspnea.  Abdomen: Soft non-distended. No guarding or rigidity. +bowel sounds. . Skin: Warm, dry. No cyanosis.  Psych: Mood normal and affect was mood congruent. Responds to questions appropriately.   Assessment/Plan:  Active Problems:   Hx of CABG   Acute on chronic respiratory failure with hypoxia (HCC)   COPD with acute exacerbation (HCC)  Acute on Chronic Respiratory Failure with Hypoxia, COPD with Acute Exacerbation, Pulmonary Nodules: Pt reports his breathing has returned to baseline however still on higher than normal oxygen. He clinically looks improved as well. There is some concern whether this is CAP vs COPD exacerbation (or both) vs sequelae from his known history of pulmonary nodules which are concerning for malignancy. He denies any weight loss and reports that these nodules are being followed by his PCP at the [place]. He reports having 2 CT scans for them. Given his quick improvement, am inclined to believe this is COPD  exacerbation due to recent viral URI (had malaise, sore throat, fatigue followed by cough productive of yellow sputum) and that these multifocal opacities are chronic. Pct intermediate at 0.35.  -Continue abx however transition from IV Azith + Ceftriaxone --> PO Azithromycin -Continue steroids however will transition from IV solumedrol --> PO Prednisone  -Transfer out of SDU to med/surg bed -Duonebs Q6H PRN -Dulera BID -Mucinex DM  Type 2 Diabetes: On Levemir 3-5 units QHS + novolog sliding scale.   PAF: In atrial fibrillation on examination. Not on anticoags due to fall risk. Continue ASA 81 mg.   Dispo: Anticipated discharge tomorrow.   Taura Lamarre, DO 06/11/2016, 10:27 AM Pager: [phone]

## 2016-06-11 NOTE — Progress Notes (Signed)
Patient reports increased work of breathing. O2 saturation is 98% on 3 liters and respiratory rate 24. Reesa Chew, MD notified. RN administered dulera inhaler per MD order.

## 2016-06-12 LAB — GLUCOSE, CAPILLARY
GLUCOSE-CAPILLARY: 189 mg/dL — AB (ref 65–99)
GLUCOSE-CAPILLARY: 166 mg/dL — AB (ref 65–99)
Glucose-Capillary: 174 mg/dL — ABNORMAL HIGH (ref 65–99)
Glucose-Capillary: 179 mg/dL — ABNORMAL HIGH (ref 65–99)

## 2016-06-12 MED ORDER — MENTHOL 3 MG MT LOZG: 1.0000 | LOZENGE | OROMUCOSAL | Status: DC | PRN

## 2016-06-12 MED ORDER — INSULIN DETEMIR 100 UNIT/ML ~~LOC~~ SOLN
10.0000 [IU] | Freq: Every day | SUBCUTANEOUS | Status: DC
  Administered 2016-06-12: 10 [IU] via SUBCUTANEOUS
  Filled 2016-06-12: qty 0.1

## 2016-06-12 MED ORDER — ALBUTEROL SULFATE (2.5 MG/3ML) 0.083% IN NEBU
2.5000 mg | INHALATION_SOLUTION | RESPIRATORY_TRACT | Status: DC | PRN
  Administered 2016-06-12 – 2016-06-14 (×2): 2.5 mg via RESPIRATORY_TRACT
  Filled 2016-06-12 (×2): qty 3

## 2016-06-12 NOTE — Progress Notes (Signed)
Notified MD regarding BP of 141/100 d/t increased diastolic. Was informed to continue to monitor.   Eleanora Neighbor, RN
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
## 2016-06-13 DIAGNOSIS — Z7982 Long term (current) use of aspirin: Secondary | ICD-10-CM

## 2016-06-13 DIAGNOSIS — I480 Paroxysmal atrial fibrillation: Secondary | ICD-10-CM

## 2016-06-13 DIAGNOSIS — E119 Type 2 diabetes mellitus without complications: Secondary | ICD-10-CM

## 2016-06-13 DIAGNOSIS — J441 Chronic obstructive pulmonary disease with (acute) exacerbation: Secondary | ICD-10-CM

## 2016-06-13 DIAGNOSIS — R918 Other nonspecific abnormal finding of lung field: Secondary | ICD-10-CM

## 2016-06-13 DIAGNOSIS — Z9981 Dependence on supplemental oxygen: Secondary | ICD-10-CM

## 2016-06-13 DIAGNOSIS — Z794 Long term (current) use of insulin: Secondary | ICD-10-CM

## 2016-06-13 DIAGNOSIS — J9621 Acute and chronic respiratory failure with hypoxia: Secondary | ICD-10-CM

## 2016-06-13 LAB — GLUCOSE, CAPILLARY
GLUCOSE-CAPILLARY: 176 mg/dL — AB (ref 65–99)
GLUCOSE-CAPILLARY: 198 mg/dL — AB (ref 65–99)
Glucose-Capillary: 139 mg/dL — ABNORMAL HIGH (ref 65–99)
Glucose-Capillary: 202 mg/dL — ABNORMAL HIGH (ref 65–99)

## 2016-06-13 MED ORDER — GLUCERNA SHAKE PO LIQD
237.0000 mL | Freq: Three times a day (TID) | ORAL | Status: DC
  Administered 2016-06-13 – 2016-06-14 (×2): 237 mL via ORAL

## 2016-06-13 MED ORDER — MAGNESIUM CITRATE PO SOLN
1.0000 | Freq: Once | ORAL | Status: AC
  Administered 2016-06-13: 1 via ORAL
  Filled 2016-06-13: qty 296

## 2016-06-13 MED ORDER — INSULIN DETEMIR 100 UNIT/ML ~~LOC~~ SOLN
5.0000 [IU] | Freq: Every day | SUBCUTANEOUS | Status: DC
  Administered 2016-06-13: 5 [IU] via SUBCUTANEOUS
  Filled 2016-06-13: qty 0.05

## 2016-06-13 NOTE — Progress Notes (Signed)
Internal Medicine Attending:   I saw and examined the patient. I reviewed the resident's note and I agree with the resident's findings and plan as documented in the resident's note. [name] restaurant status appears improved. He is currently resting comfortably on his home 2 L of oxygen son is concerned about some delirium last night when he called the patient from home, and patient was confused about where he was and what was on TV. This appears improved today and he is oriented. He is also concerned that history [name] has not been eating well.  Therefore we will obtain a nutritional consult today. Given his respiratory status is improved I think we can go ahead and discontinue steroids as this is likely attributing to last night's delirium. I suspect he can be discharged home tomorrow.

## 2016-06-13 NOTE — Progress Notes (Signed)
Initial Nutrition Assessment  DOCUMENTATION CODES:   Not applicable  INTERVENTION:  Provide Glucerna Shake po TID, each supplement provides 220 kcal and 10 grams of protein.  Encourage adequate PO intake.   NUTRITION DIAGNOSIS:   Inadequate oral intake related to poor appetite as evidenced by meal completion < 50%.  GOAL:   Patient will meet greater than or equal to 90% of their needs  MONITOR:   PO intake, Supplement acceptance, Labs, Weight trends, Skin, I & O's  REASON FOR ASSESSMENT:   Consult Poor PO  ASSESSMENT:   76 year old man with hx of severe COPD with emphysema, chronic O2 requring , dCHF, PAF who was admitted after a week of progressive malaise, cough, dyspnea, reduced appetite. He was admitted via ED with respiratory failure and treated for COPD exacerbation .  Pt reports having a decreased appetite since admission. Family at bedside. They report pt was eating well PTA with usual consumption of at least 3 meals a day with snacks in between. Pt reports weight has been stable. Meal completion has been 25-50%. Family worried pt with poor po intake. Pt is agreeable to Glucerna shake to aid in caloric and protein needs. Son reports pt had recently started to consume Ensure the couple of days PTA and has been consuming one a day. Son does report Ensure has been causing increased blood glucose, thus family and pt educated on Sp[place] to help aid in blood glucose management. Pt encouraged to continue supplementation at home especially if po intake is poor. Pt requested a Glucerna Shake towards the end of the assessment as he reports he will not likely consume much of his lunch tray. A Glucerna Shake was brought to bedside.   Pt with no observed significant fat or muscle mass loss.   Labs and medications reviewed.   Diet Order:  Diet heart healthy/carb modified Room service appropriate? Yes; Fluid consistency: Thin  Skin:  Reviewed, no issues  Last BM:   3/19  Height:   Ht Readings from Last 1 Encounters:  06/10/16 5\' 3"  (1.6 m)    Weight:   Wt Readings from Last 1 Encounters:  06/12/16 147 lb 11.3 oz (67 kg)    Ideal Body Weight:  56.36 kg  BMI:  Body mass index is 26.17 kg/m.  Estimated Nutritional Needs:   Kcal:  1700-1900  Protein:  70-85 grams  Fluid:  1.7 - 1.9 L/day  EDUCATION NEEDS:   Education needs addressed  Corrin Parker, MS, RD, LDN Pager # [phone] After hours/ weekend pager # [phone]

## 2016-06-13 NOTE — Progress Notes (Signed)
Physical Therapy Treatment Patient Details Name: [name] MRN: [id number] DOB: 20-Jan-1941 Today's Date: 06/13/2016    History of Present Illness Pt is a 76 y/o male admitted secondary to SOB with productive, ?CAP. PMH including but not limited to COPD (O2 dependent, 2L during the day and 4L at night), CHF, CAD s/p CABG in 2011, DM, HTN, PAF and PAD.    PT Comments    Pt was able to walk from initial level of sats at 95% but dropped to 84% on 2L walking 30'.  He is then able to maintain O2 sats above 90% with a bit higher concentration at 3L for strengthening standing but took back to 2L at rest.  Will follow acutely for more strengthening and gait endurance as his vitals indicate is appropriate, and transition home when able.   Follow Up Recommendations  Home health PT     Equipment Recommendations  None recommended by PT    Recommendations for Other Services       Precautions / Restrictions Precautions Precautions: Fall (telemetry) Restrictions Weight Bearing Restrictions: No    Mobility  Bed Mobility Overal bed mobility: Modified Independent                Transfers Overall transfer level: Needs assistance   Transfers: Sit to/from Stand Sit to Stand: Min guard         General transfer comment: occas reminder for hand placement  Ambulation/Gait Ambulation/Gait assistance: Min guard Ambulation Distance (Feet): 30 Feet Assistive device: 1 person hand held assist Gait Pattern/deviations: Step-through pattern;Narrow base of support;Trunk flexed;Decreased stride length Gait velocity: decreased Gait velocity interpretation: Below normal speed for age/gender General Gait Details: pt is able to control with touch control from PT by using O2 tank as a pt of reference   Stairs            Wheelchair Mobility    Modified Rankin (Stroke Patients Only)       Balance     Sitting balance-Leahy Scale: Good       Standing balance-Leahy Scale:  Fair                      Cognition Arousal/Alertness: Awake/alert Behavior During Therapy: WFL for tasks assessed/performed Overall Cognitive Status: Within Functional Limits for tasks assessed                      Exercises General Exercises - Lower Extremity Hip ABduction/ADduction: Standing;10 reps;Both;AROM (hip ext with 10 reps each AROM) Toe Raises: 10 reps;Both;Standing    General Comments General comments (skin integrity, edema, etc.): Pt could control static balance but used tank to touch for assist with standing strengthening      Pertinent Vitals/Pain Pain Assessment: No/denies pain    Home Living                      Prior Function            PT Goals (current goals can now be found in the care plan section) Acute Rehab PT Goals Patient Stated Goal: return home Progress towards PT goals: Progressing toward goals    Frequency    Min 3X/week      PT Plan Current plan remains appropriate    Co-evaluation             End of Session Equipment Utilized During Treatment: Gait belt;Oxygen Activity Tolerance: Patient tolerated treatment well;Treatment limited secondary to medical complications (  Comment) (walk shorter due to O2 sat dropping to 84% on 2L ) Patient left: in bed;with call bell/phone within reach;with family/visitor present (pt is sitting side of bed) Nurse Communication: Mobility status PT Visit Diagnosis: Unsteadiness on feet (R26.81);Other abnormalities of gait and mobility (R26.89)     Time: 3662-9476 PT Time Calculation (min) (ACUTE ONLY): 26 min  Charges:  $Gait Training: 8-22 mins $Therapeutic Exercise: 8-22 mins                    G Codes:       Ramond Dial July 02, 2016, 1:02 PM   Mee Hives, PT MS Acute Rehab Dept. Number: [place] [facility] [place]
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
## 2016-06-13 NOTE — Progress Notes (Signed)
Son at bedside, [place], stated he wanted to talk to MD about his father staying another day in the hospital. Pt's son stated he did not feel that pt should go home.   Paged MD to inform.   Paulla Fore, RN

## 2016-06-14 LAB — GLUCOSE, CAPILLARY
GLUCOSE-CAPILLARY: 151 mg/dL — AB (ref 65–99)
Glucose-Capillary: 247 mg/dL — ABNORMAL HIGH (ref 65–99)

## 2016-06-14 MED ORDER — GLUCERNA SHAKE PO LIQD: 237.0000 mL | Freq: Three times a day (TID) | ORAL | 3 refills | Status: DC

## 2016-06-14 MED ORDER — IPRATROPIUM-ALBUTEROL 0.5-2.5 (3) MG/3ML IN SOLN
3.0000 mL | Freq: Four times a day (QID) | RESPIRATORY_TRACT | Status: DC
  Administered 2016-06-14 (×2): 3 mL via RESPIRATORY_TRACT
  Filled 2016-06-14 (×2): qty 3

## 2016-06-14 MED ORDER — IPRATROPIUM-ALBUTEROL 0.5-2.5 (3) MG/3ML IN SOLN: 3.0000 mL | Freq: Four times a day (QID) | RESPIRATORY_TRACT | 0 refills | Status: DC | PRN

## 2016-06-14 NOTE — Progress Notes (Signed)
   Subjective: [name] was seen and evaluated today at bedside. Was eating breakfast without issue. Reports he feels ready for discharge and that his breathing has returned to baseline. Denies any events overnight and son feels ready for discharge today as well. Son asked that I put [name] reaction to prednisone in his allergy list however explained that confusion was not a true allergy.   Objective:  Vital signs in last 24 hours: Vitals:   06/14/16 0545 06/14/16 0748 06/14/16 0750 06/14/16 1022  BP:    127/79  Pulse:    (!) 107  Resp:    18  Temp:    98.9 F (37.2 C)  TempSrc:    Oral  SpO2: 98% 93% 93% 95%  Weight:      Height:       General: Elderly and chronically ill appearing, in no acute distress. Sitting at edge of bed eating breakfast.  HENT: EOMI. No conjunctival injection, icterus or ptosis.  Cardiovascular: IR, IR. No murmur or rub appreciated. Pulmonary: lungs clear. Upper respiratory airway sounds appreciated on neck, resolved with cough. Normal WOB. On 2L via [place]. Abdomen: Soft non-distended. Skin: Warm, dry. No cyanosis.  Psych: His mood is normal and answers questions appropriately.   Assessment/Plan:  Acute on Chronic Respiratory Failure with Hypoxia, COPD with Acute Exacerbation: Back to baseline following 5 days of Azithromycin, 4 days of Prednisone and duonebs. No more delirium since discontinuing steroids. Both patient and son comfortable with discharge today.  -D/c home with home O2, spirva, dulera and prn albuterol.  -Mucinex DM prn  Pulmonary Nodules: Unclear if inflammatory and related to current exacerbation versus malignancy. Pt ensures his PCP at the [place] is monitoring these and he's had several scans however this will be addressed in d/c summary for continued follow-up.   Type 2 Diabetes: CBGs within range. Will dc home with his usual home dose.  -Continue SSI  PAF: In atrial fibrillation on examination. Not on anticoags due to fall risk per  patient.  -Continue ASA 81 mg.   Dispo: Anticipated discharge this afternoon.  Einar Gip, D.O.  PGY-I Internal Medicine Resident Pager# [phone] 06/14/2016, 11:23 AM

## 2016-06-14 NOTE — Progress Notes (Signed)
Patient is discharged from room 6E07 at this time. Alert and in stable condition. IV site d/c'd and instructions read to patient and son with understanding verbalized. Left unit via wheelchair with all belongings at side.

## 2016-06-14 NOTE — Progress Notes (Signed)
Physical Therapy Treatment Patient Details Name: [name] MRN: [id number] DOB: Sep 14, 1940 Today's Date: 06/14/2016    History of Present Illness Pt is a 76 y/o male admitted secondary to SOB with productive, ?CAP. PMH including but not limited to COPD (O2 dependent, 2L during the day and 4L at night), CHF, CAD s/p CABG in 2011, DM, HTN, PAF and PAD.    PT Comments    Pt improving steadily.  Still with watery breath sounds.  Sats on 2L [place] at 88-90% and on 3L at 93-94%.    Follow Up Recommendations  Home health PT     Equipment Recommendations  None recommended by PT    Recommendations for Other Services       Precautions / Restrictions Precautions Precautions: Fall    Mobility  Bed Mobility Overal bed mobility: Modified Independent                Transfers Overall transfer level: Needs assistance Equipment used: Rolling walker (2 wheeled) Transfers: Sit to/from Stand Sit to Stand: Supervision            Ambulation/Gait Ambulation/Gait assistance: Supervision Ambulation Distance (Feet): 380 Feet (with 3 standing rest breaks where vitals taken) Assistive device: Rolling walker (2 wheeled) Gait Pattern/deviations: Step-through pattern Gait velocity: decreased Gait velocity interpretation: Below normal speed for age/gender General Gait Details: generally steady with safe use of the RW, slower cadence.  On 2L [place] sats 88-90%  EHR 90's-100's bpm,  on 3L [place] safs 93/94% at 90's to 100's bpm.  Son informed that for longer distances, pt may need 3L, but room to room in the house, likely concentrator can be left on 2 L   Stairs            Wheelchair Mobility    Modified Rankin (Stroke Patients Only)       Balance Overall balance assessment: Needs assistance Sitting-balance support: Feet supported Sitting balance-Leahy Scale: Good     Standing balance support: During functional activity;Single extremity supported Standing balance-Leahy Scale:  Fair                      Cognition Arousal/Alertness: Awake/alert Behavior During Therapy: WFL for tasks assessed/performed Overall Cognitive Status: Within Functional Limits for tasks assessed                      Exercises      General Comments        Pertinent Vitals/Pain Pain Assessment: No/denies pain    Home Living                      Prior Function            PT Goals (current goals can now be found in the care plan section) Acute Rehab PT Goals Patient Stated Goal: return home PT Goal Formulation: With patient Time For Goal Achievement: 06/25/16 Potential to Achieve Goals: Good Progress towards PT goals: Progressing toward goals    Frequency    Min 3X/week      PT Plan Current plan remains appropriate    Co-evaluation             End of Session Equipment Utilized During Treatment: Oxygen Activity Tolerance: Patient tolerated treatment well Patient left: in bed;with call bell/phone within reach;with family/visitor present Nurse Communication: Mobility status PT Visit Diagnosis: Unsteadiness on feet (R26.81)     Time: 1478-2956 PT Time Calculation (min) (ACUTE ONLY): 26 min  Charges:  $Gait Training: 8-22 mins $Therapeutic Activity: 8-22 mins                    G CodesTessie Fass Olivia Pavelko 06/14/2016, 12:31 PM  06/14/2016  Donnella Sham, PT [phone]  (pager)

## 2016-06-14 NOTE — Care Management Important Message (Signed)
Important Message  Patient Details  Name: [name] MRN: [id number] Date of Birth: 03-23-1941   Medicare Important Message Given:  Yes    Orbie Pyo 06/14/2016, 11:46 AM

## 2016-06-15 LAB — CULTURE, BLOOD (ROUTINE X 2)
Culture: NO GROWTH
Culture: NO GROWTH

## 2016-06-23 NOTE — Discharge Summary (Signed)
Name: [name] MRN: [id number] DOB: 06/27/1940 76 y.o. PCP: Jeffery Milan, MD  Date of Admission: 06/10/2016 11:07 AM Date of Discharge: 06/14/2016 Attending Physician: Dr. Tommy Newman  Discharge Diagnosis: 1. Acute on Chronic Respiratory Failure with Hypoxia 2. COPD Exacerbation 3. Pulmonary nodules  Principal Problem:   Acute on chronic respiratory failure with hypoxia (HCC) Active Problems:   Hx of CABG   COPD with acute exacerbation (HCC)   Pulmonary nodules   Discharge Medications: Allergies as of 06/14/2016      Reactions   Ticlopidine Hcl Swelling   Lorazepam Other (See Comments)   Altered mental status from 2 doses of IV Ativan on 05/10/14 HALLUCINATIONS AND DELIRIOUS   Other Other (See Comments)   Narcotics: Hallucinations and paranoia   Prednisone Other (See Comments)   NOT TRUE ALLERGY - CONFUSION      Medication List    STOP taking these medications   azithromycin 250 MG tablet Commonly known as:  ZITHROMAX   predniSONE 10 MG tablet Commonly known as:  DELTASONE     TAKE these medications   acetaminophen 500 MG tablet Commonly known as:  TYLENOL Take 500 mg by mouth every 6 (six) hours as needed for mild pain or moderate pain.   albuterol (2.5 MG/3ML) 0.083% nebulizer solution Commonly known as:  PROVENTIL Take 2.5 mg by nebulization every 6 (six) hours as needed for wheezing or shortness of breath.   PROAIR HFA 108 (90 Base) MCG/ACT inhaler Generic drug:  albuterol Inhale 2 puffs into the lungs every 4 (four) hours as needed for wheezing or shortness of breath.   aspirin 81 MG EC tablet Take 1 tablet (81 mg total) by mouth daily.   atorvastatin 40 MG tablet Commonly known as:  LIPITOR Take 40 mg by mouth daily.   budesonide-formoterol 160-4.5 MCG/ACT inhaler Commonly known as:  SYMBICORT Inhale 2 puffs into the lungs 2 (two) times daily.   busPIRone 7.5 MG tablet Commonly known as:  BUSPAR Take 7.5 mg by mouth 2 (two) times daily.     calcium carbonate 500 MG chewable tablet Commonly known as:  TUMS - dosed in mg elemental calcium Chew 1 tablet by mouth 2 (two) times daily as needed for indigestion or heartburn.   CALTRATE 600+D PLUS 600-400 MG-UNIT per tablet Chew 1 tablet by mouth daily.   clonazePAM 0.5 MG tablet Commonly known as:  KLONOPIN Take 0.25 mg by mouth at bedtime.   dextromethorphan-guaiFENesin 30-600 MG 12hr tablet Commonly known as:  MUCINEX DM Take 1 tablet by mouth 2 (two) times daily. What changed:  when to take this   diltiazem 240 MG 24 hr capsule Commonly known as:  CARDIZEM CD Take 1 capsule (240 mg total) by mouth daily.   feeding supplement (GLUCERNA SHAKE) Liqd Take 237 mLs by mouth 3 (three) times daily between meals.   ferrous gluconate 324 MG tablet Commonly known as:  FERGON Take 324 mg by mouth 2 (two) times daily with a meal.   furosemide 40 MG tablet Commonly known as:  LASIX TAKE 1 TABLET BY MOUTH EVERY DAY What changed:  See the new instructions.   insulin lispro 100 UNIT/ML injection Commonly known as:  HUMALOG Inject 3-6 Units into the skin 3 (three) times daily as needed for high blood sugar.   ipratropium-albuterol 0.5-2.5 (3) MG/3ML Soln Commonly known as:  DUONEB Take 3 mLs by nebulization every 6 (six) hours as needed.   LEVEMIR 100 UNIT/ML injection Generic drug:  insulin  detemir Inject 3-5 Units into the skin See admin instructions. Before evening meal (dinner) AS NEEDED   OXYGEN Inhale 2-4 L into the lungs See admin instructions. Inhale 2 L continuous during the day and 4 L at night for sleep   pantoprazole 40 MG tablet Commonly known as:  PROTONIX TAKE 1 TABLET BY MOUTH TWICE A DAY What changed:  See the new instructions.   potassium chloride SA 20 MEQ tablet Commonly known as:  K-DUR,KLOR-CON Take 1 tablet (20 mEq total) by mouth daily.   SPIRIVA HANDIHALER 18 MCG inhalation capsule Generic drug:  tiotropium INHALE CONTENTS OF 1 CAPSULE  DAILY   ZYRTEC ALLERGY 10 MG tablet Generic drug:  cetirizine Take 1 tablet (10 mg total) by mouth every morning.       Disposition and follow-up:   JefferyJeffery Newman was discharged from [name] in stable  condition.  At the hospital follow up visit please address:  1.  COPD Exacerbation: Ensure patient is still able to tolerate his Newman oxygen requirement of 2L during the day and 4L at night. Pulmonary Nodules: He will require follow-up for this in the outpatient setting. Suggest repeat imaging +/- biopsy for definitive diagnosis. Concern for malignancy.   2.  Labs / imaging needed at time of follow-up: Advanced imaging as above.   3.  Pending labs/ test needing follow-up: None.  Follow-up Appointments: [facility]    Hospital Course by problem list: Principal Problem:   Acute on chronic respiratory failure with hypoxia (HCC) Active Problems:   Hx of CABG   COPD with acute exacerbation (HCC)   Pulmonary nodules   1. Acute on Chronic Respiratory Failure with Hypoxia, COPD Exacerbation [name] is a 76 year old male admitted following a week of progressive malaise, productive cough, dyspnea, increased oxygen requirement and decreased appetite. CXR showed emphysema with scattered irregular tiny foci of airspace opacities concerning for multifocal pneumonia vs inflammatory alveolitis vs multiple pulmonary nodules. Procalcitonin 0.35. He was treated with Azithromycin, steroids and scheduled duonebs with prompt improvement of his respiratory symptoms by [name]. Steroids were discontinued on day 4 due to concerns of delirium and agitation overnight. He was discharged Newman on his Newman oxygen after completing 5 days of abx and 4 days of steroids.  2. Pulmonary Nodules Multiple pulmonary nodules appreciated on CXR and chart review shows CT evidence of multiple pulmonary nodules, largest measuring 8 mm in the left upper lobe. Patient reports his primary care physician is  following these nodules. Patient will require additional follow-up in the outpatient setting for concern of malignancy.   Discharge Vitals:   BP 127/79 (BP Location: Right Arm)   Pulse (!) 107   Temp 98.9 F (37.2 C) (Oral)   Resp 18   Ht 5\' 3"  (1.6 m)   Wt 145 lb 1 oz (65.8 kg)   SpO2 96%   BMI 25.70 kg/m   Pertinent Labs, Studies, and Procedures:  CT chest 01/2015: Spiculated nodule in left upper lobe measuring 8 mm.  CT chest 06/2015: Irregular multifocal airspace disease, worse on left. Areas of nodular opacities.  2 view chest: Emphysema with scattered irregular tiny foci of airspace disease.   Discharge Instructions: Mr. Ketchum, you were admitted for respiratory failure due to a COPD exacerbation. You completed a 5 day course of antibiotics during your admission as well as a short course of steroids. You need to follow up with your primary care provider about the lung nodules. Please return  to ED if your symptoms return.   SignedEinar Gip, DO 06/23/2016, 5:38 PM   Pager: [phone]

## 2017-08-03 ENCOUNTER — Emergency Department (HOSPITAL_COMMUNITY): Payer: Medicare Other

## 2017-08-03 ENCOUNTER — Inpatient Hospital Stay (HOSPITAL_COMMUNITY)
Admission: EM | Admit: 2017-08-03 | Discharge: 2017-08-06 | DRG: 190 | Disposition: A | Discharge status: Home / Self Care | Payer: Medicare Other | Attending: Internal Medicine | Admitting: Internal Medicine

## 2017-08-03 ENCOUNTER — Encounter (HOSPITAL_COMMUNITY): Payer: Self-pay | Admitting: Emergency Medicine

## 2017-08-03 DIAGNOSIS — Z66 Do not resuscitate: Secondary | ICD-10-CM | POA: Diagnosis present

## 2017-08-03 DIAGNOSIS — I2510 Atherosclerotic heart disease of native coronary artery without angina pectoris: Secondary | ICD-10-CM | POA: Diagnosis present

## 2017-08-03 DIAGNOSIS — Z87891 Personal history of nicotine dependence: Secondary | ICD-10-CM

## 2017-08-03 DIAGNOSIS — K219 Gastro-esophageal reflux disease without esophagitis: Secondary | ICD-10-CM | POA: Diagnosis present

## 2017-08-03 DIAGNOSIS — J181 Lobar pneumonia, unspecified organism: Secondary | ICD-10-CM | POA: Diagnosis not present

## 2017-08-03 DIAGNOSIS — I482 Chronic atrial fibrillation: Secondary | ICD-10-CM | POA: Diagnosis present

## 2017-08-03 DIAGNOSIS — J189 Pneumonia, unspecified organism: Secondary | ICD-10-CM | POA: Diagnosis present

## 2017-08-03 DIAGNOSIS — J441 Chronic obstructive pulmonary disease with (acute) exacerbation: Secondary | ICD-10-CM | POA: Diagnosis present

## 2017-08-03 DIAGNOSIS — J440 Chronic obstructive pulmonary disease with acute lower respiratory infection: Principal | ICD-10-CM | POA: Diagnosis present

## 2017-08-03 DIAGNOSIS — E1151 Type 2 diabetes mellitus with diabetic peripheral angiopathy without gangrene: Secondary | ICD-10-CM | POA: Diagnosis present

## 2017-08-03 DIAGNOSIS — I272 Pulmonary hypertension, unspecified: Secondary | ICD-10-CM | POA: Diagnosis present

## 2017-08-03 DIAGNOSIS — Z888 Allergy status to other drugs, medicaments and biological substances status: Secondary | ICD-10-CM

## 2017-08-03 DIAGNOSIS — I252 Old myocardial infarction: Secondary | ICD-10-CM

## 2017-08-03 DIAGNOSIS — Z8249 Family history of ischemic heart disease and other diseases of the circulatory system: Secondary | ICD-10-CM

## 2017-08-03 DIAGNOSIS — E785 Hyperlipidemia, unspecified: Secondary | ICD-10-CM | POA: Diagnosis present

## 2017-08-03 DIAGNOSIS — I4892 Unspecified atrial flutter: Secondary | ICD-10-CM | POA: Diagnosis not present

## 2017-08-03 DIAGNOSIS — J9621 Acute and chronic respiratory failure with hypoxia: Secondary | ICD-10-CM

## 2017-08-03 DIAGNOSIS — J9620 Acute and chronic respiratory failure, unspecified whether with hypoxia or hypercapnia: Secondary | ICD-10-CM

## 2017-08-03 DIAGNOSIS — R918 Other nonspecific abnormal finding of lung field: Secondary | ICD-10-CM | POA: Diagnosis present

## 2017-08-03 DIAGNOSIS — Z885 Allergy status to narcotic agent status: Secondary | ICD-10-CM

## 2017-08-03 DIAGNOSIS — Z8261 Family history of arthritis: Secondary | ICD-10-CM

## 2017-08-03 DIAGNOSIS — Z87442 Personal history of urinary calculi: Secondary | ICD-10-CM

## 2017-08-03 DIAGNOSIS — I071 Rheumatic tricuspid insufficiency: Secondary | ICD-10-CM | POA: Diagnosis present

## 2017-08-03 DIAGNOSIS — M1990 Unspecified osteoarthritis, unspecified site: Secondary | ICD-10-CM | POA: Diagnosis present

## 2017-08-03 DIAGNOSIS — Z86010 Personal history of colonic polyps: Secondary | ICD-10-CM

## 2017-08-03 DIAGNOSIS — M810 Age-related osteoporosis without current pathological fracture: Secondary | ICD-10-CM | POA: Diagnosis present

## 2017-08-03 DIAGNOSIS — Y95 Nosocomial condition: Secondary | ICD-10-CM | POA: Diagnosis present

## 2017-08-03 DIAGNOSIS — R4182 Altered mental status, unspecified: Secondary | ICD-10-CM | POA: Diagnosis not present

## 2017-08-03 DIAGNOSIS — F329 Major depressive disorder, single episode, unspecified: Secondary | ICD-10-CM | POA: Diagnosis present

## 2017-08-03 DIAGNOSIS — E538 Deficiency of other specified B group vitamins: Secondary | ICD-10-CM | POA: Diagnosis present

## 2017-08-03 DIAGNOSIS — I480 Paroxysmal atrial fibrillation: Secondary | ICD-10-CM | POA: Diagnosis present

## 2017-08-03 DIAGNOSIS — R531 Weakness: Secondary | ICD-10-CM | POA: Diagnosis not present

## 2017-08-03 DIAGNOSIS — I4891 Unspecified atrial fibrillation: Secondary | ICD-10-CM | POA: Diagnosis present

## 2017-08-03 DIAGNOSIS — Z9981 Dependence on supplemental oxygen: Secondary | ICD-10-CM

## 2017-08-03 DIAGNOSIS — Z7951 Long term (current) use of inhaled steroids: Secondary | ICD-10-CM

## 2017-08-03 DIAGNOSIS — G8929 Other chronic pain: Secondary | ICD-10-CM | POA: Diagnosis present

## 2017-08-03 DIAGNOSIS — R51 Headache: Secondary | ICD-10-CM | POA: Diagnosis not present

## 2017-08-03 DIAGNOSIS — I110 Hypertensive heart disease with heart failure: Secondary | ICD-10-CM | POA: Diagnosis present

## 2017-08-03 DIAGNOSIS — I5032 Chronic diastolic (congestive) heart failure: Secondary | ICD-10-CM | POA: Diagnosis present

## 2017-08-03 DIAGNOSIS — Z951 Presence of aortocoronary bypass graft: Secondary | ICD-10-CM

## 2017-08-03 DIAGNOSIS — I10 Essential (primary) hypertension: Secondary | ICD-10-CM | POA: Diagnosis present

## 2017-08-03 DIAGNOSIS — Z79899 Other long term (current) drug therapy: Secondary | ICD-10-CM

## 2017-08-03 DIAGNOSIS — R079 Chest pain, unspecified: Secondary | ICD-10-CM | POA: Diagnosis not present

## 2017-08-03 DIAGNOSIS — Z794 Long term (current) use of insulin: Secondary | ICD-10-CM

## 2017-08-03 LAB — COMPREHENSIVE METABOLIC PANEL
ALK PHOS: 52 U/L (ref 38–126)
ALT: 19 U/L (ref 17–63)
ANION GAP: 9 (ref 5–15)
AST: 21 U/L (ref 15–41)
Albumin: 2.8 g/dL — ABNORMAL LOW (ref 3.5–5.0)
BUN: 11 mg/dL (ref 6–20)
CALCIUM: 8.6 mg/dL — AB (ref 8.9–10.3)
CO2: 27 mmol/L (ref 22–32)
CREATININE: 0.86 mg/dL (ref 0.61–1.24)
Chloride: 99 mmol/L — ABNORMAL LOW (ref 101–111)
Glucose, Bld: 194 mg/dL — ABNORMAL HIGH (ref 65–99)
Potassium: 4.1 mmol/L (ref 3.5–5.1)
Sodium: 135 mmol/L (ref 135–145)
Total Bilirubin: 0.8 mg/dL (ref 0.3–1.2)
Total Protein: 6.3 g/dL — ABNORMAL LOW (ref 6.5–8.1)

## 2017-08-03 LAB — I-STAT TROPONIN, ED: TROPONIN I, POC: 0.01 ng/mL (ref 0.00–0.08)

## 2017-08-03 LAB — I-STAT CHEM 8, ED
BUN: 15 mg/dL (ref 6–20)
CHLORIDE: 96 mmol/L — AB (ref 101–111)
Calcium, Ion: 1.09 mmol/L — ABNORMAL LOW (ref 1.15–1.40)
Creatinine, Ser: 0.8 mg/dL (ref 0.61–1.24)
GLUCOSE: 195 mg/dL — AB (ref 65–99)
HCT: 44 % (ref 39.0–52.0)
Hemoglobin: 15 g/dL (ref 13.0–17.0)
POTASSIUM: 4.3 mmol/L (ref 3.5–5.1)
Sodium: 136 mmol/L (ref 135–145)
TCO2: 30 mmol/L (ref 22–32)

## 2017-08-03 LAB — CBC
HEMATOCRIT: 44.6 % (ref 39.0–52.0)
HEMOGLOBIN: 14.5 g/dL (ref 13.0–17.0)
MCH: 31.7 pg (ref 26.0–34.0)
MCHC: 32.5 g/dL (ref 30.0–36.0)
MCV: 97.6 fL (ref 78.0–100.0)
Platelets: 253 10*3/uL (ref 150–400)
RBC: 4.57 MIL/uL (ref 4.22–5.81)
RDW: 13.8 % (ref 11.5–15.5)
WBC: 8.1 10*3/uL (ref 4.0–10.5)

## 2017-08-03 LAB — I-STAT ARTERIAL BLOOD GAS, ED
Acid-Base Excess: 2 mmol/L (ref 0.0–2.0)
Bicarbonate: 26.9 mmol/L (ref 20.0–28.0)
O2 SAT: 95 %
PCO2 ART: 42.2 mmHg (ref 32.0–48.0)
PH ART: 7.41 (ref 7.350–7.450)
PO2 ART: 75 mmHg — AB (ref 83.0–108.0)
Patient temperature: 97.7
TCO2: 28 mmol/L (ref 22–32)

## 2017-08-03 LAB — PROTIME-INR
INR: 1.16
Prothrombin Time: 14.7 seconds (ref 11.4–15.2)

## 2017-08-03 LAB — DIFFERENTIAL
Basophils Absolute: 0 10*3/uL (ref 0.0–0.1)
Basophils Relative: 0 %
EOS PCT: 3 %
Eosinophils Absolute: 0.2 10*3/uL (ref 0.0–0.7)
LYMPHS ABS: 1.2 10*3/uL (ref 0.7–4.0)
Lymphocytes Relative: 15 %
MONO ABS: 0.6 10*3/uL (ref 0.1–1.0)
Monocytes Relative: 8 %
Neutro Abs: 6 10*3/uL (ref 1.7–7.7)
Neutrophils Relative %: 74 %

## 2017-08-03 LAB — CBG MONITORING, ED: Glucose-Capillary: 124 mg/dL — ABNORMAL HIGH (ref 65–99)

## 2017-08-03 LAB — APTT: aPTT: 42 seconds — ABNORMAL HIGH (ref 24–36)

## 2017-08-03 NOTE — ED Triage Notes (Signed)
Pt states yesterday around 9am he started having trouble gripping with his right arm. Pt has had a headache off and on for a few days. Pt's son reports that pt hasn't been himself mentally- slower to answer questions, wearing two pairs of glasses, etc. Pt also reports chest pain off and on for the past few days. Denies n/v/sob. Pt wears 2L [place]. Right grip weaker than left. Speech clear. Slight right arm drift. No facial droop.

## 2017-08-03 NOTE — ED Notes (Signed)
Patient transported to X-ray 

## 2017-08-03 NOTE — ED Provider Notes (Signed)
[facility] EMERGENCY DEPARTMENT Provider Note   CSN: [id number] Arrival date & time: 08/03/17  1715     History   Chief Complaint Chief Complaint  Patient presents with  . Chest Pain  . Weakness    HPI [name] is a 77 y.o. male.  The history is provided by the patient and a relative.  Chest Pain   Associated symptoms include weakness.  Weakness  Associated symptoms include chest pain.  He has a history of COPD, diabetes, hypertension, diastolic heart failure, hyperlipidemia, myocardial infarction and comes in with confusion over the last 2 weeks.  His son states that his mental state has not been right.  He has been forgetful and doing unusual things like putting on 2 pairs of glasses.  Symptoms have been stable over those 2 weeks.  He has had cough productive of some brownish sputum.  He denies change in his baseline dyspnea.  He wears oxygen at home at 2 L/min.  There have been no fever, chills, sweats.  There is been no vomiting or diarrhea.  He has had a couple of episodes of chest pain in the last 2days which has not been related to exertion.  There have been no medication changes recently.  He does have atrial fibrillation and is not anticoagulated because of history of GI bleeds been on anticoagulants.  Past Medical History:  Diagnosis Date  . Alcohol abuse   . Allergic rhinitis   . ANEMIA, B12 DEFICIENCY 12/02/2008  . Arthritis   . CAD (coronary artery disease)    a.  s/p CABG 2011.  Marland Kitchen CAROTID ARTERY STENOSIS 04/16/2009  . Cataract   . Chronic diastolic CHF (congestive heart failure) ([place])   . Chronic respiratory failure ([place])   . COLONIC POLYPS 05/21/2007  . COPD, severe ([place])   . Diabetes mellitus without complication ([place])   . DIVERTICULAR DISEASE 05/21/2007  . Essential hypertension   . Falls   . GERD (gastroesophageal reflux disease)   . GI BLEEDING 09/09/2008  . Hemoptysis   . HIATAL HERNIA 05/21/2007  . History of kidney stones   .  HYPERLIPIDEMIA 11/21/2006  . Internal hemorrhoids   . MYOCARDIAL INFARCTION, HX OF 11/21/2006  . NEPHROLITHIASIS 05/21/2007  . Normocytic anemia   . NSVT (nonsustained ventricular tachycardia) ([place])    a. Holter 2015: NSR, A. Fib/flutter, short runs of NSVT  . OSTEOPOROSIS 11/21/2006  . Osteoporosis   . Paroxysmal atrial fibrillation ([place])    a. Anticoag stopped due to falls, rectal bleeding.  . Paroxysmal atrial flutter ([place])   . Pneumonia   . Pulmonary nodule    a. Dx 01/2015 -> advised to f/u PCP for PET.  Marland Kitchen PVD (peripheral vascular disease) ([place])    a. s/p prior iliac stenting.    Patient Active Problem List   Diagnosis Date Noted  . Pulmonary nodules   . COPD with acute exacerbation ([place]) 06/10/2016  . Chest pain 02/23/2016  . Lung nodule, solitary   . HCAP (healthcare-associated pneumonia)   . Iron deficiency anemia 02/15/2015  . CAD (coronary artery disease) 09/18/2014  . Compression fracture of L1 lumbar vertebra (HCC)   . Syncope 08/23/2014  . Low back pain 08/23/2014  . Hypertension 08/23/2014  . COPD (chronic obstructive pulmonary disease) ([place]) 08/23/2014  . Chronic atrial fibrillation ([place]) 08/23/2014  . Syncope and collapse   . Olecranon bursitis 07/09/2014  . Diabetes mellitus type II, controlled ([place]) 07/01/2014  . Chronic anticoagulation 05/29/2014  .  Acute on chronic respiratory failure with hypoxia ([place]) 05/14/2014  . Diastolic CHF ([place]) 69/62/9528  . COPD exacerbation ([place])   . CAP (community acquired pneumonia) 04/03/2014  . Allergic rhinitis 02/06/2014  . GERD (gastroesophageal reflux disease) 02/06/2014  . Do not resuscitate 02/06/2014  . Erectile dysfunction 06/18/2010  . Atrial fibrillation ([place]) 11/06/2009  . Peripheral vascular disease ([place]) 09/02/2009  . Carotid artery stenosis 04/16/2009  . ANEMIA, B12 DEFICIENCY 12/02/2008  . COPD GOLD III 05/21/2007  . TOBACCO USE 01/19/2007  . Hyperlipidemia 11/21/2006  . Essential hypertension  11/21/2006  . Hx of CABG 11/21/2006  . Osteoporosis 11/21/2006    Past Surgical History:  Procedure Laterality Date  . APPENDECTOMY    . CARDIOVERSION N/A 04/16/2014   Procedure: CARDIOVERSION;  Surgeon: Josue Hector, MD;  Location: [facility] ENDOSCOPY;  Service: Cardiovascular;  Laterality: N/A;  . CHOLECYSTECTOMY    . CORONARY ARTERY BYPASS GRAFT  2011  . CORONARY STENT PLACEMENT     03/07/2001  . HEMORRHOID SURGERY    . INGUINAL HERNIA REPAIR  10/10/08  . MASS EXCISION Left 07/09/2014   Procedure: LEFT ELBOW BURSECTOMY EXCISION MASS;  Surgeon: Iran Planas, MD;  Location: [facility];  Service: Orthopedics;  Laterality: Left;  . OLECRANON BURSECTOMY  07/09/2014  . PTCA    . THYROIDECTOMY     partial        Home Medications    Prior to Admission medications   Medication Sig Start Date End Date Taking? Authorizing Provider  acetaminophen (TYLENOL) 500 MG tablet Take 500 mg by mouth every 6 (six) hours as needed for mild pain or moderate pain.    [provider]  albuterol (PROAIR HFA) 108 (90 Base) MCG/ACT inhaler Inhale 2 puffs into the lungs every 4 (four) hours as needed for wheezing or shortness of breath.    [provider]  albuterol (PROVENTIL) (2.5 MG/3ML) 0.083% nebulizer solution Take 2.5 mg by nebulization every 6 (six) hours as needed for wheezing or shortness of breath.    [provider]  aspirin EC 81 MG EC tablet Take 1 tablet (81 mg total) by mouth daily. Patient not taking: Reported on 06/10/2016 02/25/16   Cristal Ford, DO  atorvastatin (LIPITOR) 40 MG tablet Take 40 mg by mouth daily.    [provider]  budesonide-formoterol (SYMBICORT) 160-4.5 MCG/ACT inhaler Inhale 2 puffs into the lungs 2 (two) times daily.    [provider]  busPIRone (BUSPAR) 7.5 MG tablet Take 7.5 mg by mouth 2 (two) times daily.     [provider]  calcium carbonate (TUMS - DOSED IN MG ELEMENTAL CALCIUM) 500 MG chewable tablet Chew 1  tablet by mouth 2 (two) times daily as needed for indigestion or heartburn.    [provider]  Calcium Carbonate-Vit D-Min (CALTRATE 600+D PLUS) 600-400 MG-UNIT per tablet Chew 1 tablet by mouth daily.      [provider]  cetirizine (ZYRTEC ALLERGY) 10 MG tablet Take 1 tablet (10 mg total) by mouth every morning. 08/17/10   Parrett, Fonnie Mu, NP  clonazePAM (KLONOPIN) 0.5 MG tablet Take 0.25 mg by mouth at bedtime.    [provider]  dextromethorphan-guaiFENesin (MUCINEX DM) 30-600 MG 12hr tablet Take 1 tablet by mouth 2 (two) times daily. Patient taking differently: Take 1 tablet by mouth at bedtime.  02/24/16   Mikhail, Velta Addison, DO  diltiazem (CARDIZEM CD) 240 MG 24 hr capsule Take 1 capsule (240 mg total) by mouth daily. 01/21/16  Sherren Mocha, MD  feeding supplement, GLUCERNA SHAKE, (GLUCERNA SHAKE) LIQD Take 237 mLs by mouth 3 (three) times daily between meals. 06/14/16   Molt, Bethany, DO  ferrous gluconate (FERGON) 324 MG tablet Take 324 mg by mouth 2 (two) times daily with a meal.    [provider]  furosemide (LASIX) 40 MG tablet TAKE 1 TABLET BY MOUTH EVERY DAY Patient taking differently: TAKE 40 MG BY MOUTH EVERY DAY 10/20/14   Burtis Junes, NP  insulin detemir (LEVEMIR) 100 UNIT/ML injection Inject 3-5 Units into the skin See admin instructions. Before evening meal (dinner) AS NEEDED    [provider]  insulin lispro (HUMALOG) 100 UNIT/ML injection Inject 3-6 Units into the skin 3 (three) times daily as needed for high blood sugar.     [provider]  ipratropium-albuterol (DUONEB) 0.5-2.5 (3) MG/3ML SOLN Take 3 mLs by nebulization every 6 (six) hours as needed. 06/14/16   Molt, Bethany, DO  OXYGEN Inhale 2-4 L into the lungs See admin instructions. Inhale 2 L continuous during the day and 4 L at night for sleep    [provider]  pantoprazole (PROTONIX) 40 MG tablet TAKE 1 TABLET BY MOUTH TWICE A DAY Patient taking  differently: TAKE 80 MG BY MOUTH DAILY 05/19/14   Marin Olp, MD  potassium chloride SA (K-DUR,KLOR-CON) 20 MEQ tablet Take 1 tablet (20 mEq total) by mouth daily. 03/17/14   Burtis Junes, NP  SPIRIVA HANDIHALER 18 MCG inhalation capsule INHALE CONTENTS OF 1 CAPSULE DAILY 01/24/14   Swords, Darrick Penna, MD    Family History Family History  Problem Relation Age of Onset  . Arthritis Mother        rheumatoid  . Heart attack Father   . Heart disease Father   . Heart disease Brother   . Colon cancer Brother 70  . Heart disease Brother   . Stomach cancer Neg Hx     Social History Social History   Tobacco Use  . Smoking status: Former Smoker    Packs/day: 3.00    Years: 50.00    Pack years: 150.00    Types: Cigarettes    Last attempt to quit: 09/25/2009    Years since quitting: 7.8  . Smokeless tobacco: Never Used  Substance Use Topics  . Alcohol use: Yes    Alcohol/week: 3.0 oz    Types: 6 Standard drinks or equivalent per week    Comment: [place] 2-3 a night  . Drug use: No     Allergies   Ticlopidine hcl; Lorazepam; Other; and Prednisone   Review of Systems Review of Systems  Cardiovascular: Positive for chest pain.  Neurological: Positive for weakness.  All other systems reviewed and are negative.    Physical Exam Updated Vital Signs BP 134/75 (BP Location: Left Arm)   Pulse 67   Temp 98.4 F (36.9 C) (Oral)   Resp (!) 21   Ht 5\' 4"  (1.626 m)   Wt 61.2 kg (135 lb)   SpO2 95%   BMI 23.17 kg/m   Physical Exam  Nursing note and vitals reviewed.  77 year old male, resting comfortably and in no acute distress. Vital signs are normal. Oxygen saturation is 95%, which is normal. Head is normocephalic and atraumatic. PERRLA, EOMI. Oropharynx is clear. Neck is nontender and supple without adenopathy or JVD. Back is nontender and there is no CVA tenderness. Lungs have generally diminished airflow with some faint expiratory wheezes at both bases.  There  are no rales rhonchi. Chest is nontender. Heart has regular rate and rhythm without murmur. Abdomen is soft, flat, nontender without masses or hepatosplenomegaly and peristalsis is normoactive. Extremities have 1+ edema, full range of motion is present. Skin is warm and dry without rash. Neurologic: Awake, alert, oriented to person and place but not time (knows day and month but not year), cranial nerves are intact, there are no motor or sensory deficits.  ED Treatments / Results  Labs (all labs ordered are listed, but only abnormal results are displayed) Labs Reviewed  APTT - Abnormal; Notable for the following components:      Result Value   aPTT 42 (*)    All other components within normal limits  COMPREHENSIVE METABOLIC PANEL - Abnormal; Notable for the following components:   Chloride 99 (*)    Glucose, Bld 194 (*)    Calcium 8.6 (*)    Total Protein 6.3 (*)    Albumin 2.8 (*)    All other components within normal limits  URINALYSIS, ROUTINE W REFLEX MICROSCOPIC - Abnormal; Notable for the following components:   APPearance HAZY (*)    All other components within normal limits  CBG MONITORING, ED - Abnormal; Notable for the following components:   Glucose-Capillary 124 (*)    All other components within normal limits  I-STAT CHEM 8, ED - Abnormal; Notable for the following components:   Chloride 96 (*)    Glucose, Bld 195 (*)    Calcium, Ion 1.09 (*)    All other components within normal limits  I-STAT ARTERIAL BLOOD GAS, ED - Abnormal; Notable for the following components:   pO2, Arterial 75.0 (*)    All other components within normal limits  CULTURE, BLOOD (ROUTINE X 2)  CULTURE, BLOOD (ROUTINE X 2)  PROTIME-INR  CBC  DIFFERENTIAL  I-STAT TROPONIN, ED  I-STAT CG4 LACTIC ACID, ED    EKG EKG Interpretation  Date/Time:  Thursday Aug 03 2017 17:38:20 EDT Ventricular Rate:  63 PR Interval:    QRS Duration: 92 QT Interval:  414 QTC Calculation: 423 R  Axis:   0 Text Interpretation:  Atrial fibrillation Incomplete right bundle branch block Possible Inferior infarct , age undetermined Abnormal ECG When compared with ECG of 06/10/2016, No significant change was found Confirmed by Delora Fuel ([id number]) on 08/03/2017 10:59:08 PM   Radiology Dg Chest 2 View  Result Date: 08/03/2017 CLINICAL DATA:  Altered mental status EXAM: CHEST - 2 VIEW COMPARISON:  06/10/2016 FINDINGS: Patchy/masslike opacity in the posterior left upper lung. Additional multifocal patchy opacities in the left lower lobe. Right lung is essentially clear. The heart is top-normal in size. Postsurgical changes related to prior CABG. Median sternotomy. IMPRESSION: Patchy/masslike opacity in the posterior left upper lung, possibly reflecting pneumonia, although tumor is not excluded. Additional multifocal patchy opacities in the left lower lung, suggesting pneumonia. Consider CT chest with contrast for further evaluation. Electronically Signed   By: Julian Hy M.D.   On: 08/03/2017 23:44   Ct Head Wo Contrast  Result Date: 08/03/2017 CLINICAL DATA:  Right arm weakness and headache. EXAM: CT HEAD WITHOUT CONTRAST TECHNIQUE: Contiguous axial images were obtained from the base of the skull through the vertex without intravenous contrast. COMPARISON:  Head CT 07/16/2015 FINDINGS: Brain: No mass lesion, intraparenchymal hemorrhage or extra-axial collection. No evidence of acute cortical infarct. Periventricular hypoattenuation suggesting chronic microvascular disease. Vascular: No hyperdense vessel or unexpected vascular calcification. Skull: Normal visualized skull base, calvarium and extracranial soft  tissues. Sinuses/Orbits: No sinus fluid levels or advanced mucosal thickening. No mastoid effusion. Normal orbits. IMPRESSION: Chronic small vessel disease without acute intracranial abnormality. Electronically Signed   By: Ulyses Jarred M.D.   On: 08/03/2017 20:14    Procedures Procedures    Medications Ordered in ED Medications  cefTRIAXone (ROCEPHIN) 1 g in sodium chloride 0.9 % 100 mL IVPB (has no administration in time range)  azithromycin (ZITHROMAX) 500 mg in sodium chloride 0.9 % 250 mL IVPB (has no administration in time range)     Initial Impression / Assessment and Plan / ED Course  I have reviewed the triage vital signs and the nursing notes.  Pertinent labs & imaging results that were available during my care of the patient were reviewed by me and considered in my medical decision making (see chart for details).  Increasing confusion of uncertain cause.  Laboratory work-up has been unremarkable including normal troponin.  Old records are reviewed, and he does have prior admissions for COPD exacerbations.  CT of head today was unremarkable, showing only chronic white matter disease.  Will check urinalysis and chest x-ray to look for occult infection.  We will also check ABG to make sure he is not having carbon dioxide retention.  Urinalysis shows no evidence of infection.  ABG shows no evidence of CO2 retention.  Chest x-ray does show patchy infiltrates consistent with pneumonia.  This is most likely the reason for his mental status change.  He does not show signs of sepsis currently, but will check lactic acid level.  Blood cultures are obtained and he is started on antibiotics of ceftriaxone and azithromycin.  Case is discussed with Dr. Hal Hope of [facility], who agrees to admit the patient.  Final Clinical Impressions(s) / ED Diagnoses   Final diagnoses:  Community acquired pneumonia, unspecified laterality  Altered mental status, unspecified altered mental status type    ED Discharge Orders    None       Delora Fuel, MD [phone]

## 2017-08-04 ENCOUNTER — Inpatient Hospital Stay (HOSPITAL_COMMUNITY): Payer: Medicare Other

## 2017-08-04 ENCOUNTER — Encounter (HOSPITAL_COMMUNITY): Payer: Self-pay | Admitting: Internal Medicine

## 2017-08-04 DIAGNOSIS — I252 Old myocardial infarction: Secondary | ICD-10-CM | POA: Diagnosis not present

## 2017-08-04 DIAGNOSIS — I2510 Atherosclerotic heart disease of native coronary artery without angina pectoris: Secondary | ICD-10-CM | POA: Diagnosis present

## 2017-08-04 DIAGNOSIS — I639 Cerebral infarction, unspecified: Secondary | ICD-10-CM | POA: Diagnosis not present

## 2017-08-04 DIAGNOSIS — J440 Chronic obstructive pulmonary disease with acute lower respiratory infection: Secondary | ICD-10-CM | POA: Diagnosis present

## 2017-08-04 DIAGNOSIS — J9601 Acute respiratory failure with hypoxia: Secondary | ICD-10-CM

## 2017-08-04 DIAGNOSIS — J441 Chronic obstructive pulmonary disease with (acute) exacerbation: Secondary | ICD-10-CM

## 2017-08-04 DIAGNOSIS — J181 Lobar pneumonia, unspecified organism: Secondary | ICD-10-CM | POA: Diagnosis present

## 2017-08-04 DIAGNOSIS — I480 Paroxysmal atrial fibrillation: Secondary | ICD-10-CM

## 2017-08-04 DIAGNOSIS — I482 Chronic atrial fibrillation: Secondary | ICD-10-CM | POA: Diagnosis present

## 2017-08-04 DIAGNOSIS — M1990 Unspecified osteoarthritis, unspecified site: Secondary | ICD-10-CM | POA: Diagnosis present

## 2017-08-04 DIAGNOSIS — R4182 Altered mental status, unspecified: Secondary | ICD-10-CM

## 2017-08-04 DIAGNOSIS — J9621 Acute and chronic respiratory failure with hypoxia: Secondary | ICD-10-CM | POA: Diagnosis present

## 2017-08-04 DIAGNOSIS — I5032 Chronic diastolic (congestive) heart failure: Secondary | ICD-10-CM | POA: Diagnosis not present

## 2017-08-04 DIAGNOSIS — I351 Nonrheumatic aortic (valve) insufficiency: Secondary | ICD-10-CM | POA: Diagnosis not present

## 2017-08-04 DIAGNOSIS — Z951 Presence of aortocoronary bypass graft: Secondary | ICD-10-CM | POA: Diagnosis not present

## 2017-08-04 DIAGNOSIS — I10 Essential (primary) hypertension: Secondary | ICD-10-CM | POA: Diagnosis not present

## 2017-08-04 DIAGNOSIS — E1151 Type 2 diabetes mellitus with diabetic peripheral angiopathy without gangrene: Secondary | ICD-10-CM | POA: Diagnosis present

## 2017-08-04 DIAGNOSIS — E785 Hyperlipidemia, unspecified: Secondary | ICD-10-CM | POA: Diagnosis present

## 2017-08-04 DIAGNOSIS — I110 Hypertensive heart disease with heart failure: Secondary | ICD-10-CM | POA: Diagnosis present

## 2017-08-04 DIAGNOSIS — K219 Gastro-esophageal reflux disease without esophagitis: Secondary | ICD-10-CM | POA: Diagnosis present

## 2017-08-04 DIAGNOSIS — I272 Pulmonary hypertension, unspecified: Secondary | ICD-10-CM | POA: Diagnosis present

## 2017-08-04 DIAGNOSIS — Y95 Nosocomial condition: Secondary | ICD-10-CM | POA: Diagnosis present

## 2017-08-04 DIAGNOSIS — M810 Age-related osteoporosis without current pathological fracture: Secondary | ICD-10-CM | POA: Diagnosis present

## 2017-08-04 DIAGNOSIS — I4892 Unspecified atrial flutter: Secondary | ICD-10-CM | POA: Diagnosis present

## 2017-08-04 DIAGNOSIS — J189 Pneumonia, unspecified organism: Secondary | ICD-10-CM | POA: Diagnosis not present

## 2017-08-04 DIAGNOSIS — G8929 Other chronic pain: Secondary | ICD-10-CM | POA: Diagnosis present

## 2017-08-04 DIAGNOSIS — E538 Deficiency of other specified B group vitamins: Secondary | ICD-10-CM | POA: Diagnosis present

## 2017-08-04 DIAGNOSIS — I071 Rheumatic tricuspid insufficiency: Secondary | ICD-10-CM | POA: Diagnosis present

## 2017-08-04 DIAGNOSIS — F329 Major depressive disorder, single episode, unspecified: Secondary | ICD-10-CM | POA: Diagnosis present

## 2017-08-04 DIAGNOSIS — R0602 Shortness of breath: Secondary | ICD-10-CM | POA: Diagnosis not present

## 2017-08-04 DIAGNOSIS — Z66 Do not resuscitate: Secondary | ICD-10-CM | POA: Diagnosis present

## 2017-08-04 DIAGNOSIS — Z9981 Dependence on supplemental oxygen: Secondary | ICD-10-CM | POA: Diagnosis not present

## 2017-08-04 LAB — GLUCOSE, CAPILLARY
GLUCOSE-CAPILLARY: 243 mg/dL — AB (ref 65–99)
GLUCOSE-CAPILLARY: 110 mg/dL — AB (ref 65–99)
Glucose-Capillary: 109 mg/dL — ABNORMAL HIGH (ref 65–99)
Glucose-Capillary: 196 mg/dL — ABNORMAL HIGH (ref 65–99)

## 2017-08-04 LAB — BASIC METABOLIC PANEL
ANION GAP: 9 (ref 5–15)
BUN: 9 mg/dL (ref 6–20)
CALCIUM: 8.5 mg/dL — AB (ref 8.9–10.3)
CHLORIDE: 100 mmol/L — AB (ref 101–111)
CO2: 31 mmol/L (ref 22–32)
CREATININE: 0.82 mg/dL (ref 0.61–1.24)
GFR calc non Af Amer: >60 mL/min (ref >60)
GLUCOSE: 156 mg/dL — AB (ref 65–99)
Potassium: 4.1 mmol/L (ref 3.5–5.1)
Sodium: 140 mmol/L (ref 135–145)

## 2017-08-04 LAB — URINALYSIS, ROUTINE W REFLEX MICROSCOPIC
Bilirubin Urine: NEGATIVE
Glucose, UA: NEGATIVE mg/dL
HGB URINE DIPSTICK: NEGATIVE
Ketones, ur: NEGATIVE mg/dL
Leukocytes, UA: NEGATIVE
Nitrite: NEGATIVE
PH: 5 (ref 5.0–8.0)
Protein, ur: NEGATIVE mg/dL
SPECIFIC GRAVITY, URINE: 1.018 (ref 1.005–1.030)

## 2017-08-04 LAB — STREP PNEUMONIAE URINARY ANTIGEN: Strep Pneumo Urinary Antigen: NEGATIVE

## 2017-08-04 LAB — CBC
HCT: 42.3 % (ref 39.0–52.0)
HEMOGLOBIN: 13.9 g/dL (ref 13.0–17.0)
MCH: 32 pg (ref 26.0–34.0)
MCHC: 32.9 g/dL (ref 30.0–36.0)
MCV: 97.2 fL (ref 78.0–100.0)
PLATELETS: 238 10*3/uL (ref 150–400)
RBC: 4.35 MIL/uL (ref 4.22–5.81)
RDW: 13.7 % (ref 11.5–15.5)
WBC: 7.6 10*3/uL (ref 4.0–10.5)

## 2017-08-04 LAB — BRAIN NATRIURETIC PEPTIDE: B Natriuretic Peptide: 158.6 pg/mL — ABNORMAL HIGH (ref 0.0–100.0)

## 2017-08-04 LAB — TROPONIN I: Troponin I: <0.03 ng/mL (ref <0.03)

## 2017-08-04 LAB — INFLUENZA PANEL BY PCR (TYPE A & B)
INFLBPCR: NEGATIVE
Influenza A By PCR: NEGATIVE

## 2017-08-04 LAB — I-STAT CG4 LACTIC ACID, ED: LACTIC ACID, VENOUS: 1.36 mmol/L (ref 0.5–1.9)

## 2017-08-04 LAB — D-DIMER, QUANTITATIVE: D-Dimer, Quant: 3.08 ug/mL-FEU — ABNORMAL HIGH (ref 0.00–0.50)

## 2017-08-04 LAB — TSH: TSH: 1.923 u[IU]/mL (ref 0.350–4.500)

## 2017-08-04 MED ORDER — PANTOPRAZOLE SODIUM 40 MG PO TBEC
40.0000 mg | DELAYED_RELEASE_TABLET | Freq: Two times a day (BID) | ORAL | Status: DC
  Administered 2017-08-04 – 2017-08-06 (×5): 40 mg via ORAL
  Filled 2017-08-04 (×5): qty 1

## 2017-08-04 MED ORDER — POTASSIUM CHLORIDE CRYS ER 20 MEQ PO TBCR
20.0000 meq | EXTENDED_RELEASE_TABLET | Freq: Every day | ORAL | Status: DC
  Administered 2017-08-04 – 2017-08-06 (×3): 20 meq via ORAL
  Filled 2017-08-04 (×3): qty 1

## 2017-08-04 MED ORDER — INSULIN ASPART 100 UNIT/ML ~~LOC~~ SOLN
0.0000 [IU] | Freq: Three times a day (TID) | SUBCUTANEOUS | Status: DC
  Administered 2017-08-04: 2 [IU] via SUBCUTANEOUS
  Administered 2017-08-04: 3 [IU] via SUBCUTANEOUS
  Administered 2017-08-05: 2 [IU] via SUBCUTANEOUS
  Administered 2017-08-05 – 2017-08-06 (×2): 1 [IU] via SUBCUTANEOUS
  Administered 2017-08-06: 2 [IU] via SUBCUTANEOUS

## 2017-08-04 MED ORDER — IPRATROPIUM-ALBUTEROL 0.5-2.5 (3) MG/3ML IN SOLN
3.0000 mL | Freq: Three times a day (TID) | RESPIRATORY_TRACT | Status: DC
  Filled 2017-08-04: qty 3

## 2017-08-04 MED ORDER — SODIUM CHLORIDE 0.9 % IV SOLN
1.0000 g | INTRAVENOUS | Status: DC
  Administered 2017-08-05 – 2017-08-06 (×2): 1 g via INTRAVENOUS
  Filled 2017-08-04 (×2): qty 10

## 2017-08-04 MED ORDER — IPRATROPIUM-ALBUTEROL 0.5-2.5 (3) MG/3ML IN SOLN
3.0000 mL | Freq: Three times a day (TID) | RESPIRATORY_TRACT | Status: DC
  Administered 2017-08-05 – 2017-08-06 (×4): 3 mL via RESPIRATORY_TRACT
  Filled 2017-08-04 (×5): qty 3

## 2017-08-04 MED ORDER — IOPAMIDOL (ISOVUE-370) INJECTION 76%
100.0000 mL | Freq: Once | INTRAVENOUS | Status: AC
  Administered 2017-08-04: 75 mL via INTRAVENOUS

## 2017-08-04 MED ORDER — ATORVASTATIN CALCIUM 40 MG PO TABS
40.0000 mg | ORAL_TABLET | Freq: Every day | ORAL | Status: DC
  Administered 2017-08-04 – 2017-08-06 (×3): 40 mg via ORAL
  Filled 2017-08-04 (×3): qty 1

## 2017-08-04 MED ORDER — ONDANSETRON HCL 4 MG PO TABS: 4.0000 mg | ORAL_TABLET | Freq: Four times a day (QID) | ORAL | Status: DC | PRN

## 2017-08-04 MED ORDER — ACETAMINOPHEN 325 MG PO TABS: 650.0000 mg | ORAL_TABLET | Freq: Four times a day (QID) | ORAL | Status: DC | PRN

## 2017-08-04 MED ORDER — SODIUM CHLORIDE 0.9 % IV SOLN
500.0000 mg | Freq: Once | INTRAVENOUS | Status: AC
  Administered 2017-08-04: 500 mg via INTRAVENOUS
  Filled 2017-08-04 (×2): qty 500

## 2017-08-04 MED ORDER — ONDANSETRON HCL 4 MG/2ML IJ SOLN: 4.0000 mg | Freq: Four times a day (QID) | INTRAMUSCULAR | Status: DC | PRN

## 2017-08-04 MED ORDER — FERROUS GLUCONATE 324 (38 FE) MG PO TABS
324.0000 mg | ORAL_TABLET | Freq: Two times a day (BID) | ORAL | Status: DC
  Administered 2017-08-04 – 2017-08-06 (×5): 324 mg via ORAL
  Filled 2017-08-04 (×6): qty 1

## 2017-08-04 MED ORDER — IPRATROPIUM-ALBUTEROL 0.5-2.5 (3) MG/3ML IN SOLN
3.0000 mL | RESPIRATORY_TRACT | Status: DC | PRN
Start: 2017-08-04 — End: 2017-08-06

## 2017-08-04 MED ORDER — ALBUTEROL SULFATE (2.5 MG/3ML) 0.083% IN NEBU: 2.5000 mg | INHALATION_SOLUTION | RESPIRATORY_TRACT | Status: DC | PRN

## 2017-08-04 MED ORDER — SODIUM CHLORIDE 0.9 % IV SOLN
1.0000 g | Freq: Three times a day (TID) | INTRAVENOUS | Status: DC
  Administered 2017-08-04 (×2): 1 g via INTRAVENOUS
  Filled 2017-08-04 (×3): qty 1

## 2017-08-04 MED ORDER — ALBUTEROL SULFATE (2.5 MG/3ML) 0.083% IN NEBU
2.5000 mg | INHALATION_SOLUTION | RESPIRATORY_TRACT | Status: DC
  Administered 2017-08-04 (×2): 2.5 mg via RESPIRATORY_TRACT
  Filled 2017-08-04 (×4): qty 3

## 2017-08-04 MED ORDER — GUAIFENESIN-DM 100-10 MG/5ML PO SYRP
5.0000 mL | ORAL_SOLUTION | ORAL | Status: DC | PRN
  Administered 2017-08-04 – 2017-08-06 (×4): 5 mL via ORAL
  Filled 2017-08-04 (×4): qty 5

## 2017-08-04 MED ORDER — CLONAZEPAM 0.25 MG PO TBDP
0.2500 mg | ORAL_TABLET | Freq: Every day | ORAL | Status: DC
  Administered 2017-08-04 – 2017-08-05 (×2): 0.25 mg via ORAL
  Filled 2017-08-04 (×2): qty 1

## 2017-08-04 MED ORDER — BUDESONIDE 0.25 MG/2ML IN SUSP
0.2500 mg | Freq: Two times a day (BID) | RESPIRATORY_TRACT | Status: DC
  Administered 2017-08-04 – 2017-08-06 (×5): 0.25 mg via RESPIRATORY_TRACT
  Filled 2017-08-04 (×6): qty 2

## 2017-08-04 MED ORDER — DILTIAZEM HCL ER COATED BEADS 240 MG PO CP24
240.0000 mg | ORAL_CAPSULE | Freq: Every day | ORAL | Status: DC
  Administered 2017-08-04 – 2017-08-06 (×3): 240 mg via ORAL
  Filled 2017-08-04 (×3): qty 1

## 2017-08-04 MED ORDER — FUROSEMIDE 10 MG/ML IJ SOLN
20.0000 mg | Freq: Once | INTRAMUSCULAR | Status: AC
  Administered 2017-08-04: 20 mg via INTRAVENOUS
  Filled 2017-08-04: qty 4

## 2017-08-04 MED ORDER — IPRATROPIUM-ALBUTEROL 0.5-2.5 (3) MG/3ML IN SOLN
3.0000 mL | Freq: Four times a day (QID) | RESPIRATORY_TRACT | Status: DC
  Administered 2017-08-04 (×2): 3 mL via RESPIRATORY_TRACT
  Filled 2017-08-04: qty 3

## 2017-08-04 MED ORDER — VANCOMYCIN HCL IN DEXTROSE 1-5 GM/200ML-% IV SOLN: 1000.0000 mg | INTRAVENOUS | Status: DC

## 2017-08-04 MED ORDER — ACETAMINOPHEN 650 MG RE SUPP: 650.0000 mg | Freq: Four times a day (QID) | RECTAL | Status: DC | PRN

## 2017-08-04 MED ORDER — ASPIRIN EC 81 MG PO TBEC
81.0000 mg | DELAYED_RELEASE_TABLET | Freq: Every day | ORAL | Status: DC
  Administered 2017-08-04 – 2017-08-06 (×3): 81 mg via ORAL
  Filled 2017-08-04 (×3): qty 1

## 2017-08-04 MED ORDER — SODIUM CHLORIDE 0.9 % IV SOLN
1250.0000 mg | Freq: Once | INTRAVENOUS | Status: AC
  Administered 2017-08-04: 1250 mg via INTRAVENOUS
  Filled 2017-08-04: qty 1250

## 2017-08-04 MED ORDER — AZITHROMYCIN 500 MG PO TABS
500.0000 mg | ORAL_TABLET | Freq: Every day | ORAL | Status: DC
  Administered 2017-08-05 – 2017-08-06 (×2): 500 mg via ORAL
  Filled 2017-08-04 (×2): qty 1

## 2017-08-04 MED ORDER — IPRATROPIUM BROMIDE 0.02 % IN SOLN
0.5000 mg | RESPIRATORY_TRACT | Status: DC
  Administered 2017-08-04 (×2): 0.5 mg via RESPIRATORY_TRACT
  Filled 2017-08-04 (×4): qty 2.5

## 2017-08-04 MED ORDER — SODIUM CHLORIDE 0.9 % IV SOLN
1.0000 g | Freq: Once | INTRAVENOUS | Status: AC
  Administered 2017-08-04: 1 g via INTRAVENOUS
  Filled 2017-08-04: qty 10

## 2017-08-04 MED ORDER — GLUCERNA SHAKE PO LIQD
237.0000 mL | Freq: Three times a day (TID) | ORAL | Status: DC
Start: 2017-08-04 — End: 2017-08-06
  Administered 2017-08-04 – 2017-08-05 (×5): 237 mL via ORAL

## 2017-08-04 MED ORDER — FUROSEMIDE 40 MG PO TABS
40.0000 mg | ORAL_TABLET | Freq: Every day | ORAL | Status: DC
  Administered 2017-08-04 – 2017-08-06 (×3): 40 mg via ORAL
  Filled 2017-08-04 (×3): qty 1

## 2017-08-04 MED ORDER — ENOXAPARIN SODIUM 40 MG/0.4ML ~~LOC~~ SOLN
40.0000 mg | Freq: Every day | SUBCUTANEOUS | Status: DC
  Administered 2017-08-04 – 2017-08-06 (×3): 40 mg via SUBCUTANEOUS
  Filled 2017-08-04 (×3): qty 0.4

## 2017-08-04 MED ORDER — BUSPIRONE HCL 15 MG PO TABS
7.5000 mg | ORAL_TABLET | Freq: Two times a day (BID) | ORAL | Status: DC
  Administered 2017-08-04 – 2017-08-06 (×5): 7.5 mg via ORAL
  Filled 2017-08-04 (×5): qty 1

## 2017-08-04 MED ORDER — IOPAMIDOL (ISOVUE-370) INJECTION 76%
INTRAVENOUS | Status: AC
  Filled 2017-08-04: qty 100

## 2017-08-04 NOTE — Progress Notes (Signed)
Pharmacy Antibiotic Note  [name] is a 77 y.o. male admitted on 08/03/2017 with chest pain/weakness, possible PNA.  Pharmacy has been consulted for Vancomycin  dosing.  Plan: Vancomycin 1250 mg IV now, then Vancomycin 1 g IV q24h  Height: 5\' 3"  (160 cm) Weight: 140 lb 10.5 oz (63.8 kg) IBW/kg (Calculated) : 56.9  Temp (24hrs), Avg:98.2 F (36.8 C), Min:97.7 F (36.5 C), Max:99 F (37.2 C)  Recent Labs  Lab 08/03/17 1751 08/03/17 1805 08/04/17 0059  WBC 8.1  --   --   CREATININE 0.86 0.80  --   LATICACIDVEN  --   --  1.36    Estimated Creatinine Clearance: 62.2 mL/min (by C-G formula based on SCr of 0.8 mg/dL).    Allergies  Allergen Reactions  . Ticlopidine Hcl Swelling  . Lorazepam Other (See Comments)    Altered mental status from 2 doses of IV Ativan on 05/10/14 HALLUCINATIONS AND DELIRIOUS  . Other Other (See Comments)    Narcotics: Hallucinations and paranoia  . Prednisone Other (See Comments)    NOT TRUE ALLERGY - CONFUSION    [name], [name] 08/04/2017 3:04 AM

## 2017-08-04 NOTE — Progress Notes (Signed)
PROGRESS NOTE    [name]  BBC:[id number] DOB: 03-13-1941 DOA: 08/03/2017 PCP: Reubin Milan, MD    Brief Narrative:  77 year old male who presented with dyspnea and confusion.  He does have the significant past medical history for COPD, diastolic heart failure, coronary artery disease status post bypass grafting, type 2 diabetes mellitus, peripheral vascular disease and dyslipidemia.  She reported 2-day history of worsening dyspnea to the point where he was symptomatic with minimal efforts.  Associated with cough and subjective fevers.  On initial physical examination blood pressure 127/76, heart rate 95, respiratory rate 24, temperature 97.9, oxygen saturation 93%.  His lungs had expiratory wheezing bilaterally, no rales, heart S1-S2 present and rhythmic, no gallops, rubs or murmurs, the abdomen was soft nontender, no lower extremity edema.  Sodium 135, potassium 4.1, chloride 99, bicarb 27, glucose 194, BUN 11, creatinine 0.86, arterial blood gas 7.41/ 42.2/ 75.0/ 26.9/ 97.7%.  White count 8.1, hemoglobin 14.5, hematocrit 44.6, platelets 253.  Urinalysis negative for infection, CT with no acute changes.  Chest x-ray with left upper lobe infiltrate, left lower lobe infiltrate.  Chest CT with significant emphysematous changes, left upper lobe left lower lobe infiltrates.  Negative for pulmonary embolism.  EKG with atrial fibrillation, normal axis.   Patient was admitted to the hospital with a working diagnosis of acute hypoxic respiratory failure due to multilobar pneumonia and acute COPD exacerbation.   Assessment & Plan:   Active Problems:   Essential hypertension   Hx of CABG   Atrial fibrillation (HCC)   Acute respiratory failure with hypoxia (HCC)   Diastolic CHF (HCC)   Hypertension   HCAP (healthcare-associated pneumonia)   COPD with acute exacerbation (HCC)   Pulmonary nodules   1.  Acute on chronic hypoxic respiratory failure due to multilobar pneumonia. Patient with  persistent dyspnea, will continue antibiotic therapy with ceftriaxone and azithromycin, will follow own cultures, cell count and temperature curve. Urinary Legionella and Pneumococcus antigens. Respiratory viral panel.negative. Continue oxymetry monitoring and supplemental 02 per [place], to target 02 saturation greater than 88%. Patient at home on 3 lpm of supplemental 02 per [place].   2.  COPD exacerbation. Will continue aggressive bronchodilator therapy with duobe q 6 hours and as needed q2 hours. Hold on systemic steroids for now, patient had severe adverse reaction in the past. Continue with inhaled budesonide.   3.  Diastolic heart failure. No signs of acute exacerbation, will continue to keep negative fluid balance, continue home dose of furosemide 40 mg daily.   4.  Atrial fibrillation. Rate control with diltiazem, continue anticoagulation with aspirin.   5.  Coronary artery disease.Currently chest pain free, continue aspirin and statin therapy.   6. Depression. Continue buspirone and clonazepam.   7. T2DM. Will continue glucose cover and monitoring with insulin sliding scale. Capillary glucose 124, 109, 196.   DVT prophylaxis: enoxaparin  Code Status: dnr Family Communication: no family at the bedside  Disposition Plan: home when stable    Consultants:     Procedures:     Antimicrobials:   Ceftriaxone  Azithromycin     Subjective: Patient continue to have dyspnea, improved but still persistent. No nausea or vomiting, no chest pain, no significant cough or wheezing.   Objective: Vitals:   08/04/17 0500 08/04/17 0700 08/04/17 0747 08/04/17 0913  BP: 111/75 121/77 139/81   Pulse: 68 76 83 89  Resp:   20 16  Temp: 98.2 F (36.8 C) 98.4 F (36.9 C) 98 F (36.7 C)  TempSrc: Oral Oral Oral   SpO2: 99% 98% (!) 89% 93%  Weight:      Height:        Intake/Output Summary (Last 24 hours) at 08/04/2017 1201 Last data filed at 08/04/2017 0954 Gross per 24 hour  Intake 340 ml   Output 200 ml  Net 140 ml   Filed Weights   08/03/17 1730 08/04/17 0241  Weight: 61.2 kg (135 lb) 63.8 kg (140 lb 10.5 oz)    Examination:   General: deconditioned  Neurology: Awake and alert, non focal  E ENT: mild pallor, no icterus, oral mucosa moist Cardiovascular: No JVD. S1-S2 present, rhythmic, no gallops, rubs, or murmurs. No lower extremity edema. Pulmonary: decreased breath sounds bilaterally, poor air movement, no wheezing, no rhonchi, but positive left upper and lower rales. Gastrointestinal. Abdomen with no organomegaly, non tender, no rebound or guarding Skin. No rashes Musculoskeletal: no joint deformities     Data Reviewed: I have personally reviewed following labs and imaging studies  CBC: Recent Labs  Lab 08/03/17 1751 08/03/17 1805 08/04/17 0416  WBC 8.1  --  7.6  NEUTROABS 6.0  --   --   HGB 14.5 15.0 13.9  HCT 44.6 44.0 42.3  MCV 97.6  --  97.2  PLT 253  --  865   Basic Metabolic Panel: Recent Labs  Lab 08/03/17 1751 08/03/17 1805 08/04/17 0416  NA 135 136 140  K 4.1 4.3 4.1  CL 99* 96* 100*  CO2 27  --  31  GLUCOSE 194* 195* 156*  BUN 11 15 9   CREATININE 0.86 0.80 0.82  CALCIUM 8.6*  --  8.5*   GFR: Estimated Creatinine Clearance: 60.7 mL/min (by C-G formula based on SCr of 0.82 mg/dL). Liver Function Tests: Recent Labs  Lab 08/03/17 1751  AST 21  ALT 19  ALKPHOS 52  BILITOT 0.8  PROT 6.3*  ALBUMIN 2.8*   No results for input(s): LIPASE, AMYLASE in the last 168 hours. No results for input(s): AMMONIA in the last 168 hours. Coagulation Profile: Recent Labs  Lab 08/03/17 1751  INR 1.16   Cardiac Enzymes: Recent Labs  Lab 08/04/17 0416 08/04/17 0911  TROPONINI <0.03 <0.03   BNP (last 3 results) No results for input(s): PROBNP in the last 8760 hours. HbA1C: No results for input(s): HGBA1C in the last 72 hours. CBG: Recent Labs  Lab 08/03/17 2233 08/04/17 0801 08/04/17 1116  GLUCAP 124* 109* 196*   Lipid  Profile: No results for input(s): CHOL, HDL, LDLCALC, TRIG, CHOLHDL, LDLDIRECT in the last 72 hours. Thyroid Function Tests: Recent Labs    08/04/17 0416  TSH 1.923   Anemia Panel: No results for input(s): VITAMINB12, FOLATE, FERRITIN, TIBC, IRON, RETICCTPCT in the last 72 hours.    Radiology Studies: I have reviewed all of the imaging during this hospital visit personally     Scheduled Meds: . albuterol  2.5 mg Nebulization Q4H  . aspirin EC  81 mg Oral Daily  . atorvastatin  40 mg Oral Daily  . budesonide (PULMICORT) nebulizer solution  0.25 mg Nebulization BID  . busPIRone  7.5 mg Oral BID  . clonazePAM  0.25 mg Oral QHS  . diltiazem  240 mg Oral Daily  . enoxaparin (LOVENOX) injection  40 mg Subcutaneous Daily  . feeding supplement (GLUCERNA SHAKE)  237 mL Oral TID BM  . ferrous gluconate  324 mg Oral BID WC  . furosemide  40 mg Oral Daily  . insulin aspart  0-9  Units Subcutaneous TID WC  . iopamidol      . ipratropium  0.5 mg Nebulization Q4H  . pantoprazole  40 mg Oral BID  . potassium chloride SA  20 mEq Oral Daily   Continuous Infusions: . ceFEPime (MAXIPIME) IV 1 g (08/04/17 0618)  . [START ON 08/05/2017] vancomycin       LOS: 0 days        Mauricio Gerome Apley, MD [facility] Pager [phone]'

## 2017-08-04 NOTE — H&P (Signed)
History and Physical    [name] UVO:[id number] DOB: Jun 11, 1940 DOA: 08/03/2017  PCP: Reubin Milan, MD  Patient coming from: Home.  Chief Complaint: Shortness of breath and confusion.  HPI: [name] is a 77 y.o. male with history of COPD, diastolic CHF, CAD status post CABG, diabetes mellitus, peripheral vascular disease, hyperlipidemia presents to the ER because of increasing shortness of breath over the last few days.  Patient states that he easily gets short winded and last 2 days with minimal exertion.  Has chronic chest pain which lasted a few seconds each time.  Presently chest pain-free.  Has been a productive cough.  Has been a subjective feeling of fever and chills.  Patient's son also noticed that patient has been at times confused over the last 2 weeks.  ED Course: In the ER patient was alert awake oriented.  CT head was unremarkable.  On exam patient was mildly wheezing chest x-ray shows pneumonic process.  On my exam patient is mildly short of breath.  Patient was started on antibiotics and admitted for pneumonia COPD and further work-up.  Patient is moving all extremities without difficulty.  No facial asymmetry.  Tongue is midline pupils are equal and reactive to light.  Review of Systems: As per HPI, rest all negative.   Past Medical History:  Diagnosis Date  . Alcohol abuse   . Allergic rhinitis   . ANEMIA, B12 DEFICIENCY 12/02/2008  . Arthritis   . CAD (coronary artery disease)    a.  s/p CABG 2011.  Marland Kitchen CAROTID ARTERY STENOSIS 04/16/2009  . Cataract   . Chronic diastolic CHF (congestive heart failure) ([place])   . Chronic respiratory failure ([place])   . COLONIC POLYPS 05/21/2007  . COPD, severe ([place])   . Diabetes mellitus without complication ([place])   . DIVERTICULAR DISEASE 05/21/2007  . Essential hypertension   . Falls   . GERD (gastroesophageal reflux disease)   . GI BLEEDING 09/09/2008  . Hemoptysis   . HIATAL HERNIA 05/21/2007  . History of kidney stones   .  HYPERLIPIDEMIA 11/21/2006  . Internal hemorrhoids   . MYOCARDIAL INFARCTION, HX OF 11/21/2006  . NEPHROLITHIASIS 05/21/2007  . Normocytic anemia   . NSVT (nonsustained ventricular tachycardia) ([place])    a. Holter 2015: NSR, A. Fib/flutter, short runs of NSVT  . OSTEOPOROSIS 11/21/2006  . Osteoporosis   . Paroxysmal atrial fibrillation ([place])    a. Anticoag stopped due to falls, rectal bleeding.  . Paroxysmal atrial flutter ([place])   . Pneumonia   . Pulmonary nodule    a. Dx 01/2015 -> advised to f/u PCP for PET.  Marland Kitchen PVD (peripheral vascular disease) ([place])    a. s/p prior iliac stenting.    Past Surgical History:  Procedure Laterality Date  . APPENDECTOMY    . CARDIOVERSION N/A 04/16/2014   Procedure: CARDIOVERSION;  Surgeon: Josue Hector, MD;  Location[facility] ENDOSCOPY;  Service: Cardiovascular;  Laterality: N/A;  . CHOLECYSTECTOMY    . CORONARY ARTERY BYPASS GRAFT  2011  . CORONARY STENT PLACEMENT     03/07/2001  . HEMORRHOID SURGERY    . INGUINAL HERNIA REPAIR  10/10/08  . MASS EXCISION Left 07/09/2014   Procedure: LEFT ELBOW BURSECTOMY EXCISION MASS;  Surgeon: Iran Planas, MD;  Location: [facility];  Service: Orthopedics;  Laterality: Left;  . OLECRANON BURSECTOMY  07/09/2014  . PTCA    . THYROIDECTOMY     partial     reports that he quit  smoking about 7 years ago. His smoking use included cigarettes. He has a 150.00 pack-year smoking history. He has never used smokeless tobacco. He reports that he drinks about 3.0 oz of alcohol per week. He reports that he does not use drugs.  Allergies  Allergen Reactions  . Ticlopidine Hcl Swelling  . Lorazepam Other (See Comments)    Altered mental status from 2 doses of IV Ativan on 05/10/14 HALLUCINATIONS AND DELIRIOUS  . Other Other (See Comments)    Narcotics: Hallucinations and paranoia  . Prednisone Other (See Comments)    NOT TRUE ALLERGY - CONFUSION    Family History  Problem Relation Age of Onset  . Arthritis Mother         rheumatoid  . Heart attack Father   . Heart disease Father   . Heart disease Brother   . Colon cancer Brother 36  . Heart disease Brother   . Stomach cancer Neg Hx     Prior to Admission medications   Medication Sig Start Date End Date Taking? Authorizing Provider  acetaminophen (TYLENOL) 500 MG tablet Take 500 mg by mouth every 6 (six) hours as needed for mild pain or moderate pain.    [provider]  albuterol (PROAIR HFA) 108 (90 Base) MCG/ACT inhaler Inhale 2 puffs into the lungs every 4 (four) hours as needed for wheezing or shortness of breath.    [provider]  albuterol (PROVENTIL) (2.5 MG/3ML) 0.083% nebulizer solution Take 2.5 mg by nebulization every 6 (six) hours as needed for wheezing or shortness of breath.    [provider]  aspirin EC 81 MG EC tablet Take 1 tablet (81 mg total) by mouth daily. Patient not taking: Reported on 06/10/2016 02/25/16   Cristal Ford, DO  atorvastatin (LIPITOR) 40 MG tablet Take 40 mg by mouth daily.    [provider]  budesonide-formoterol (SYMBICORT) 160-4.5 MCG/ACT inhaler Inhale 2 puffs into the lungs 2 (two) times daily.    [provider]  busPIRone (BUSPAR) 7.5 MG tablet Take 7.5 mg by mouth 2 (two) times daily.     [provider]  calcium carbonate (TUMS - DOSED IN MG ELEMENTAL CALCIUM) 500 MG chewable tablet Chew 1 tablet by mouth 2 (two) times daily as needed for indigestion or heartburn.    [provider]  Calcium Carbonate-Vit D-Min (CALTRATE 600+D PLUS) 600-400 MG-UNIT per tablet Chew 1 tablet by mouth daily.      [provider]  cetirizine (ZYRTEC ALLERGY) 10 MG tablet Take 1 tablet (10 mg total) by mouth every morning. 08/17/10   Parrett, Fonnie Mu, NP  clonazePAM (KLONOPIN) 0.5 MG tablet Take 0.25 mg by mouth at bedtime.    [provider]  dextromethorphan-guaiFENesin (MUCINEX DM) 30-600 MG 12hr tablet Take 1 tablet by mouth 2 (two) times  daily. Patient taking differently: Take 1 tablet by mouth at bedtime.  02/24/16   Mikhail, Velta Addison, DO  diltiazem (CARDIZEM CD) 240 MG 24 hr capsule Take 1 capsule (240 mg total) by mouth daily. 01/21/16   Sherren Mocha, MD  feeding supplement, GLUCERNA SHAKE, (GLUCERNA SHAKE) LIQD Take 237 mLs by mouth 3 (three) times daily between meals. 06/14/16   Molt, Bethany, DO  ferrous gluconate (FERGON) 324 MG tablet Take 324 mg by mouth 2 (two) times daily with a meal.    [provider]  furosemide (LASIX) 40 MG tablet TAKE 1 TABLET BY MOUTH EVERY DAY Patient taking differently: TAKE 40 MG BY MOUTH EVERY DAY  10/20/14   Burtis Junes, NP  insulin detemir (LEVEMIR) 100 UNIT/ML injection Inject 3-5 Units into the skin See admin instructions. Before evening meal (dinner) AS NEEDED    [provider]  insulin lispro (HUMALOG) 100 UNIT/ML injection Inject 3-6 Units into the skin 3 (three) times daily as needed for high blood sugar.     [provider]  ipratropium-albuterol (DUONEB) 0.5-2.5 (3) MG/3ML SOLN Take 3 mLs by nebulization every 6 (six) hours as needed. 06/14/16   Molt, Bethany, DO  OXYGEN Inhale 2-4 L into the lungs See admin instructions. Inhale 2 L continuous during the day and 4 L at night for sleep    [provider]  pantoprazole (PROTONIX) 40 MG tablet TAKE 1 TABLET BY MOUTH TWICE A DAY Patient taking differently: TAKE 80 MG BY MOUTH DAILY 05/19/14   Marin Olp, MD  potassium chloride SA (K-DUR,KLOR-CON) 20 MEQ tablet Take 1 tablet (20 mEq total) by mouth daily. 03/17/14   Burtis Junes, NP  SPIRIVA HANDIHALER 18 MCG inhalation capsule INHALE CONTENTS OF 1 CAPSULE DAILY 01/24/14   Lisabeth Pick, MD    Physical Exam: Vitals:   08/04/17 0145 08/04/17 0200 08/04/17 0241 08/04/17 0247  BP: 127/76 124/79  (!) 136/59  Pulse: 95 (!) 49  68  Resp: (!) 24 18  20   Temp:    97.9 F (36.6 C)  TempSrc:    Oral  SpO2: 93% 95%  97%  Weight:   63.8 kg  (140 lb 10.5 oz)   Height:   5\' 3"  (1.6 m)       Constitutional: Moderately built and nourished. Vitals:   08/04/17 0145 08/04/17 0200 08/04/17 0241 08/04/17 0247  BP: 127/76 124/79  (!) 136/59  Pulse: 95 (!) 49  68  Resp: (!) 24 18  20   Temp:    97.9 F (36.6 C)  TempSrc:    Oral  SpO2: 93% 95%  97%  Weight:   63.8 kg (140 lb 10.5 oz)   Height:   5\' 3"  (1.6 m)    Eyes: Anicteric no pallor. ENMT: No discharge from the ears eyes nose or mouth. Neck: No JVD appreciated no mass felt. Respiratory: Mild expiratory wheeze no crepitations. Cardiovascular: S1-S2 heard no murmurs appreciated. Abdomen: Soft nontender bowel sounds present. Musculoskeletal: No edema.  No joint effusion. Skin: No rash.  Skin appears warm. Neurologic: Alert awake oriented to time place and person.  Moves all extremities 5 x 5. Psychiatric: Appears normal.  Normal affect.   Labs on Admission: I have personally reviewed following labs and imaging studies  CBC: Recent Labs  Lab 08/03/17 1751 08/03/17 1805  WBC 8.1  --   NEUTROABS 6.0  --   HGB 14.5 15.0  HCT 44.6 44.0  MCV 97.6  --   PLT 253  --    Basic Metabolic Panel: Recent Labs  Lab 08/03/17 1751 08/03/17 1805  NA 135 136  K 4.1 4.3  CL 99* 96*  CO2 27  --   GLUCOSE 194* 195*  BUN 11 15  CREATININE 0.86 0.80  CALCIUM 8.6*  --    GFR: Estimated Creatinine Clearance: 62.2 mL/min (by C-G formula based on SCr of 0.8 mg/dL). Liver Function Tests: Recent Labs  Lab 08/03/17 1751  AST 21  ALT 19  ALKPHOS 52  BILITOT 0.8  PROT 6.3*  ALBUMIN 2.8*   No results for input(s): LIPASE, AMYLASE in the last 168 hours. No results for input(s): AMMONIA  in the last 168 hours. Coagulation Profile: Recent Labs  Lab 08/03/17 1751  INR 1.16   Cardiac Enzymes: No results for input(s): CKTOTAL, CKMB, CKMBINDEX, TROPONINI in the last 168 hours. BNP (last 3 results) No results for input(s): PROBNP in the last 8760 hours. HbA1C: No results  for input(s): HGBA1C in the last 72 hours. CBG: Recent Labs  Lab 08/03/17 2233  GLUCAP 124*   Lipid Profile: No results for input(s): CHOL, HDL, LDLCALC, TRIG, CHOLHDL, LDLDIRECT in the last 72 hours. Thyroid Function Tests: No results for input(s): TSH, T4TOTAL, FREET4, T3FREE, THYROIDAB in the last 72 hours. Anemia Panel: No results for input(s): VITAMINB12, FOLATE, FERRITIN, TIBC, IRON, RETICCTPCT in the last 72 hours. Urine analysis:    Component Value Date/Time   COLORURINE YELLOW 08/03/2017 2340   APPEARANCEUR HAZY (A) 08/03/2017 2340   LABSPEC 1.018 08/03/2017 2340   PHURINE 5.0 08/03/2017 2340   GLUCOSEU NEGATIVE 08/03/2017 2340   HGBUR NEGATIVE 08/03/2017 2340   HGBUR negative 02/04/2008 1027   BILIRUBINUR NEGATIVE 08/03/2017 2340   KETONESUR NEGATIVE 08/03/2017 2340   PROTEINUR NEGATIVE 08/03/2017 2340   UROBILINOGEN 1.0 08/23/2014 1712   NITRITE NEGATIVE 08/03/2017 2340   LEUKOCYTESUR NEGATIVE 08/03/2017 2340   Sepsis Labs: @LABRCNTIP (procalcitonin:4,lacticidven:4) )No results found for this or any previous visit (from the past 240 hour(s)).   Radiological Exams on Admission: Dg Chest 2 View  Result Date: 08/03/2017 CLINICAL DATA:  Altered mental status EXAM: CHEST - 2 VIEW COMPARISON:  06/10/2016 FINDINGS: Patchy/masslike opacity in the posterior left upper lung. Additional multifocal patchy opacities in the left lower lobe. Right lung is essentially clear. The heart is top-normal in size. Postsurgical changes related to prior CABG. Median sternotomy. IMPRESSION: Patchy/masslike opacity in the posterior left upper lung, possibly reflecting pneumonia, although tumor is not excluded. Additional multifocal patchy opacities in the left lower lung, suggesting pneumonia. Consider CT chest with contrast for further evaluation. Electronically Signed   By: Julian Hy M.D.   On: 08/03/2017 23:44   Ct Head Wo Contrast  Result Date: 08/03/2017 CLINICAL DATA:  Right arm  weakness and headache. EXAM: CT HEAD WITHOUT CONTRAST TECHNIQUE: Contiguous axial images were obtained from the base of the skull through the vertex without intravenous contrast. COMPARISON:  Head CT 07/16/2015 FINDINGS: Brain: No mass lesion, intraparenchymal hemorrhage or extra-axial collection. No evidence of acute cortical infarct. Periventricular hypoattenuation suggesting chronic microvascular disease. Vascular: No hyperdense vessel or unexpected vascular calcification. Skull: Normal visualized skull base, calvarium and extracranial soft tissues. Sinuses/Orbits: No sinus fluid levels or advanced mucosal thickening. No mastoid effusion. Normal orbits. IMPRESSION: Chronic small vessel disease without acute intracranial abnormality. Electronically Signed   By: Ulyses Jarred M.D.   On: 08/03/2017 20:14    EKG: Independently reviewed.  A. fib rate controlled.  Assessment/Plan Active Problems:   Essential hypertension   Hx of CABG   Atrial fibrillation (HCC)   Acute respiratory failure with hypoxia (HCC)   Diastolic CHF (HCC)   Hypertension   HCAP (healthcare-associated pneumonia)   COPD with acute exacerbation (HCC)   Pulmonary nodules    1. Acute respiratory failure with hypoxia likely a combination of pneumonia COPD and possible CHF.  Since patient states he is getting intensely short of breath with minimal exertion I have ordered BNP and d-dimer.  If d-dimer is elevated we will get CT angiogram of the chest to rule out PE.  X-rays done showed pneumonic process and radiologist recommended CT scan anyway.  I have ordered Lasix  20 mg IV will continue with p.o. Lasix home dose.  Check 2D echo continue antibiotics continue nebulizer treatment.  Closely follow intake output daily weights metabolic panel. 2. History of CAD status post CABG -states at times he gets chest pain.  We will cycle cardiac markers check d-dimer patient is on aspirin Cardizem.  Patient's previous 2D echo showed moderate  tricuspid regurgitation with pulmonary hypertension.  Will recheck 2D echo. 3. Diabetes mellitus type 2 -patient states he takes Levemir as needed.  We will closely follow CBGs at this time.  I have placed patient on sliding scale coverage. 4. Hypertension continue Cardizem. 5. Diastolic CHF see #1.  COPD see #1. 6. Features of confusion as seen by patient's son.  Will check MRI brain given the history of A. Fib. 7. A. fib rate controlled not on anticoagulation secondary to history of recurrent GI bleed.  On Cardizem.  Rate controlled.   DVT prophylaxis: Lovenox. Code Status: DNR. Family Communication: Discussed with patient. Disposition Plan: Home. Consults called: None. Admission status: Inpatient.   Rise Patience MD [facility] Pager [phone].  If 7PM-7AM, please contact night-coverage [email or web address] Password [email or web address]  08/04/2017, 2:54 AM

## 2017-08-04 NOTE — ED Notes (Signed)
Zithromax bag leaking, bag disposed. Pharmacy called.

## 2017-08-04 NOTE — Progress Notes (Signed)
RT NOTE:  ABG collected. Results in pt chart.

## 2017-08-05 ENCOUNTER — Other Ambulatory Visit (HOSPITAL_COMMUNITY): Payer: Medicare Other

## 2017-08-05 LAB — CBC WITH DIFFERENTIAL/PLATELET
BASOS ABS: 0 10*3/uL (ref 0.0–0.1)
BASOS PCT: 0 %
EOS ABS: 0.4 10*3/uL (ref 0.0–0.7)
Eosinophils Relative: 4 %
HEMATOCRIT: 39.3 % (ref 39.0–52.0)
Hemoglobin: 12.7 g/dL — ABNORMAL LOW (ref 13.0–17.0)
Lymphocytes Relative: 14 %
Lymphs Abs: 1.4 10*3/uL (ref 0.7–4.0)
MCH: 31.7 pg (ref 26.0–34.0)
MCHC: 32.3 g/dL (ref 30.0–36.0)
MCV: 98 fL (ref 78.0–100.0)
MONOS PCT: 10 %
Monocytes Absolute: 0.9 10*3/uL (ref 0.1–1.0)
NEUTROS ABS: 6.9 10*3/uL (ref 1.7–7.7)
Neutrophils Relative %: 72 %
Platelets: 268 10*3/uL (ref 150–400)
RBC: 4.01 MIL/uL — AB (ref 4.22–5.81)
RDW: 13.9 % (ref 11.5–15.5)
WBC: 9.6 10*3/uL (ref 4.0–10.5)

## 2017-08-05 LAB — BASIC METABOLIC PANEL
ANION GAP: 9 (ref 5–15)
BUN: 12 mg/dL (ref 6–20)
CHLORIDE: 101 mmol/L (ref 101–111)
CO2: 29 mmol/L (ref 22–32)
CREATININE: 0.76 mg/dL (ref 0.61–1.24)
Calcium: 8.5 mg/dL — ABNORMAL LOW (ref 8.9–10.3)
GFR calc Af Amer: >60 mL/min (ref >60)
GFR calc non Af Amer: >60 mL/min (ref >60)
Glucose, Bld: 211 mg/dL — ABNORMAL HIGH (ref 65–99)
POTASSIUM: 4.1 mmol/L (ref 3.5–5.1)
SODIUM: 139 mmol/L (ref 135–145)

## 2017-08-05 LAB — GLUCOSE, CAPILLARY
Glucose-Capillary: 131 mg/dL — ABNORMAL HIGH (ref 65–99)
Glucose-Capillary: 160 mg/dL — ABNORMAL HIGH (ref 65–99)

## 2017-08-05 LAB — LEGIONELLA PNEUMOPHILA SEROGP 1 UR AG: L. pneumophila Serogp 1 Ur Ag: NEGATIVE

## 2017-08-05 MED ORDER — ALUM & MAG HYDROXIDE-SIMETH 200-200-20 MG/5ML PO SUSP
30.0000 mL | Freq: Four times a day (QID) | ORAL | Status: DC | PRN
  Administered 2017-08-05: 30 mL via ORAL
  Filled 2017-08-05: qty 30

## 2017-08-05 MED ORDER — GI COCKTAIL ~~LOC~~
30.0000 mL | Freq: Once | ORAL | Status: AC
  Administered 2017-08-05: 30 mL via ORAL
  Filled 2017-08-05: qty 30

## 2017-08-05 NOTE — Progress Notes (Signed)
PROGRESS NOTE    [name]  IWP:[id number] DOB: 1940-10-21 DOA: 08/03/2017 PCP: Reubin Milan, MD    Brief Narrative:  77 year old male who presented with dyspnea and confusion.  He does have the significant past medical history for COPD, diastolic heart failure, coronary artery disease status post bypass grafting, type 2 diabetes mellitus, peripheral vascular disease and dyslipidemia.  She reported 2-day history of worsening dyspnea to the point where he was symptomatic with minimal efforts.  Associated with cough and subjective fevers.  On initial physical examination blood pressure 127/76, heart rate 95, respiratory rate 24, temperature 97.9, oxygen saturation 93%.  His lungs had expiratory wheezing bilaterally, no rales, heart S1-S2 present and rhythmic, no gallops, rubs or murmurs, the abdomen was soft nontender, no lower extremity edema.  Sodium 135, potassium 4.1, chloride 99, bicarb 27, glucose 194, BUN 11, creatinine 0.86, arterial blood gas 7.41/ 42.2/ 75.0/ 26.9/ 97.7%.  White count 8.1, hemoglobin 14.5, hematocrit 44.6, platelets 253.  Urinalysis negative for infection, CT with no acute changes.  Chest x-ray with left upper lobe infiltrate, left lower lobe infiltrate.  Chest CT with significant emphysematous changes, left upper lobe left lower lobe infiltrates.  Negative for pulmonary embolism.  EKG with atrial fibrillation, normal axis.   Patient was admitted to the hospital with a working diagnosis of acute hypoxic respiratory failure due to multilobar pneumonia and acute COPD exacerbation.  Assessment & Plan:   Active Problems:   Essential hypertension   Hx of CABG   Atrial fibrillation (HCC)   Acute respiratory failure with hypoxia (HCC)   Diastolic CHF (HCC)   Hypertension   HCAP (healthcare-associated pneumonia)   COPD with acute exacerbation (HCC)   Pulmonary nodules   1.  Acute on chronic hypoxic respiratory failure due to multilobar pneumonia. Continue antibiotic  therapy with ceftriaxone and azithromycin. Cultures continue to be with no growth.  Urinary Legionella and Pneumococcus antigens negtaive. Respiratory viral panel was negative. Out of bed as tolerated prior physical therapy evaluation. Oxymetry is 95% on 2 lpm.   2.  COPD exacerbation. On aggressive bronchodilator therapy with duobes q 6 hours and as needed q2 hours, continue inhaled corticosteroids. Physical therapy evaluation.   3.  Diastolic heart failure. Continue 40 mg of furosemide daily, to keep negative fluid balance, currently with no signs of acute exacerbation.  4.  Atrial fibrillation. Continue diltiazem for rate control and anticoagulation with aspirin.   5.  Coronary artery disease. On aspirin and statin therapy.   6. Depression. On buspirone and clonazepam, no confusion or agitation.    7. T2DM. Glucose cover and monitoring with insulin sliding scale. Capillary glucose 196, 243, 110, 131, 160.   DVT prophylaxis: enoxaparin  Code Status: dnr Family Communication: no family at the bedside  Disposition Plan: home when stable    Consultants:     Procedures:     Antimicrobials:   Ceftriaxone  Azithromycin     Subjective: Dyspnea continue to improve, but not back to baseline, persistent productive cough, no chest pain, no nausea or vomiting. No fever or chills.   Objective: Vitals:   08/05/17 0328 08/05/17 0500 08/05/17 0735 08/05/17 0936  BP: (!) 100/55  118/82 131/63  Pulse: 69  72   Resp: 18  15   Temp: 98 F (36.7 C)  97.6 F (36.4 C)   TempSrc: Oral  Oral   SpO2: 98%  97%   Weight:  65 kg (143 lb 4.8 oz)    Height:  No intake or output data in the 24 hours ending 08/05/17 1028 Filed Weights   08/03/17 1730 08/04/17 0241 08/05/17 0500  Weight: 61.2 kg (135 lb) 63.8 kg (140 lb 10.5 oz) 65 kg (143 lb 4.8 oz)    Examination:   General: deconditioned  Neurology: Awake and alert, non focal  E ENT: mild pallor, no icterus, oral  mucosa moist Cardiovascular: No JVD. S1-S2 present, rhythmic, no gallops, rubs, or murmurs. Trace lower extremity edema. Pulmonary: decreased breath sounds bilaterally, decreased air movement, positive expiratory wheezing, no rhonchi but positive bibasilar and left upper lobe rales. Gastrointestinal. Abdomen with no organomegaly, non tender, no rebound or guarding Skin. No rashes Musculoskeletal: no joint deformities     Data Reviewed: I have personally reviewed following labs and imaging studies  CBC: Recent Labs  Lab 08/03/17 1751 08/03/17 1805 08/04/17 0416 08/05/17 0343  WBC 8.1  --  7.6 9.6  NEUTROABS 6.0  --   --  6.9  HGB 14.5 15.0 13.9 12.7*  HCT 44.6 44.0 42.3 39.3  MCV 97.6  --  97.2 98.0  PLT 253  --  238 329   Basic Metabolic Panel: Recent Labs  Lab 08/03/17 1751 08/03/17 1805 08/04/17 0416 08/05/17 0343  NA 135 136 140 139  K 4.1 4.3 4.1 4.1  CL 99* 96* 100* 101  CO2 27  --  31 29  GLUCOSE 194* 195* 156* 211*  BUN 11 15 9 12   CREATININE 0.86 0.80 0.82 0.76  CALCIUM 8.6*  --  8.5* 8.5*   GFR: Estimated Creatinine Clearance: 62.2 mL/min (by C-G formula based on SCr of 0.76 mg/dL). Liver Function Tests: Recent Labs  Lab 08/03/17 1751  AST 21  ALT 19  ALKPHOS 52  BILITOT 0.8  PROT 6.3*  ALBUMIN 2.8*   No results for input(s): LIPASE, AMYLASE in the last 168 hours. No results for input(s): AMMONIA in the last 168 hours. Coagulation Profile: Recent Labs  Lab 08/03/17 1751  INR 1.16   Cardiac Enzymes: Recent Labs  Lab 08/04/17 0416 08/04/17 0911  TROPONINI <0.03 <0.03   BNP (last 3 results) No results for input(s): PROBNP in the last 8760 hours. HbA1C: No results for input(s): HGBA1C in the last 72 hours. CBG: Recent Labs  Lab 08/04/17 0801 08/04/17 1116 08/04/17 1614 08/04/17 2117 08/05/17 0617  GLUCAP 109* 196* 243* 110* 131*   Lipid Profile: No results for input(s): CHOL, HDL, LDLCALC, TRIG, CHOLHDL, LDLDIRECT in the last  72 hours. Thyroid Function Tests: Recent Labs    08/04/17 0416  TSH 1.923   Anemia Panel: No results for input(s): VITAMINB12, FOLATE, FERRITIN, TIBC, IRON, RETICCTPCT in the last 72 hours.    Radiology Studies: I have reviewed all of the imaging during this hospital visit personally     Scheduled Meds: . aspirin EC  81 mg Oral Daily  . atorvastatin  40 mg Oral Daily  . azithromycin  500 mg Oral Daily  . budesonide (PULMICORT) nebulizer solution  0.25 mg Nebulization BID  . busPIRone  7.5 mg Oral BID  . clonazePAM  0.25 mg Oral QHS  . diltiazem  240 mg Oral Daily  . enoxaparin (LOVENOX) injection  40 mg Subcutaneous Daily  . feeding supplement (GLUCERNA SHAKE)  237 mL Oral TID BM  . ferrous gluconate  324 mg Oral BID WC  . furosemide  40 mg Oral Daily  . insulin aspart  0-9 Units Subcutaneous TID WC  . ipratropium-albuterol  3 mL Nebulization TID  .  pantoprazole  40 mg Oral BID  . potassium chloride SA  20 mEq Oral Daily   Continuous Infusions: . cefTRIAXone (ROCEPHIN)  IV 1 g (08/05/17 0945)     LOS: 1 day        Mauricio Gerome Apley, MD [facility] Pager [phone]

## 2017-08-06 ENCOUNTER — Inpatient Hospital Stay (HOSPITAL_COMMUNITY): Payer: Medicare Other

## 2017-08-06 DIAGNOSIS — I351 Nonrheumatic aortic (valve) insufficiency: Secondary | ICD-10-CM

## 2017-08-06 LAB — CBC WITH DIFFERENTIAL/PLATELET
BASOS ABS: 0 10*3/uL (ref 0.0–0.1)
Basophils Relative: 0 %
EOS PCT: 6 %
Eosinophils Absolute: 0.4 10*3/uL (ref 0.0–0.7)
HCT: 41.9 % (ref 39.0–52.0)
HEMOGLOBIN: 13.5 g/dL (ref 13.0–17.0)
LYMPHS PCT: 14 %
Lymphs Abs: 1.1 10*3/uL (ref 0.7–4.0)
MCH: 31.6 pg (ref 26.0–34.0)
MCHC: 32.2 g/dL (ref 30.0–36.0)
MCV: 98.1 fL (ref 78.0–100.0)
Monocytes Absolute: 0.7 10*3/uL (ref 0.1–1.0)
Monocytes Relative: 9 %
NEUTROS ABS: 5.6 10*3/uL (ref 1.7–7.7)
NEUTROS PCT: 71 %
PLATELETS: 294 10*3/uL (ref 150–400)
RBC: 4.27 MIL/uL (ref 4.22–5.81)
RDW: 13.9 % (ref 11.5–15.5)
WBC: 8 10*3/uL (ref 4.0–10.5)

## 2017-08-06 LAB — ECHOCARDIOGRAM COMPLETE
HEIGHTINCHES: 63 in
WEIGHTICAEL: 2275.15 [oz_av]

## 2017-08-06 LAB — GLUCOSE, CAPILLARY
GLUCOSE-CAPILLARY: 180 mg/dL — AB (ref 65–99)
GLUCOSE-CAPILLARY: 124 mg/dL — AB (ref 65–99)
Glucose-Capillary: 108 mg/dL — ABNORMAL HIGH (ref 65–99)
Glucose-Capillary: 124 mg/dL — ABNORMAL HIGH (ref 65–99)
Glucose-Capillary: 199 mg/dL — ABNORMAL HIGH (ref 65–99)

## 2017-08-06 LAB — BASIC METABOLIC PANEL
ANION GAP: 7 (ref 5–15)
BUN: 11 mg/dL (ref 6–20)
CHLORIDE: 99 mmol/L — AB (ref 101–111)
CO2: 34 mmol/L — ABNORMAL HIGH (ref 22–32)
Calcium: 8.6 mg/dL — ABNORMAL LOW (ref 8.9–10.3)
Creatinine, Ser: 0.84 mg/dL (ref 0.61–1.24)
Glucose, Bld: 138 mg/dL — ABNORMAL HIGH (ref 65–99)
POTASSIUM: 4.3 mmol/L (ref 3.5–5.1)
SODIUM: 140 mmol/L (ref 135–145)

## 2017-08-06 MED ORDER — TIOTROPIUM BROMIDE MONOHYDRATE 18 MCG IN CAPS: 18.0000 ug | ORAL_CAPSULE | Freq: Every day | RESPIRATORY_TRACT | 0 refills | Status: DC

## 2017-08-06 MED ORDER — AMOXICILLIN-POT CLAVULANATE 875-125 MG PO TABS: 1.0000 | ORAL_TABLET | Freq: Two times a day (BID) | ORAL | 0 refills | Status: AC

## 2017-08-06 NOTE — Progress Notes (Signed)
  Echocardiogram 2D Echocardiogram has been performed.  [name] 08/06/2017, 9:14 AM

## 2017-08-06 NOTE — Care Management (Signed)
Pt plans for d/c today.  Pt has DME O2 through [facility] but does not have a portable tank to leave hospital with.  [facility], [facility], will deliver portable tank to the room.

## 2017-08-06 NOTE — Discharge Summary (Signed)
Physician Discharge Summary  [name] WJX:[id number] DOB: 1941/02/26 DOA: 08/03/2017  PCP: Reubin Milan, MD  Admit date: 08/03/2017 Discharge date: 08/06/2017  Admitted From: Home  Disposition:  Home   Recommendations for Outpatient Follow-up and new medication changes:  1. Follow up with PCP in 1- week 2. Patient will continue supplemental 02 per [place] 3. Antibiotic therapy with Augmentin bid for 5 days 4. Continue bronchodilator therapy and inhaled corticosteroids 5. Added tiotropium.  6. Pending echocardiogram report.  7. Follow up chest imaging to ensure resolution of infiltrate.   Home Health: no   Equipment/Devices: Home 02   Discharge Condition: Stable  CODE STATUS: DNR  Diet recommendation: Heart healthy.   Brief/Interim Summary: 77 year old male who presented with dyspnea and confusion. He does have the significant past medical history for COPD with chronic hypoxic respiratory failure, diastolic heart failure, coronary artery disease status post bypass grafting, type 2 diabetes mellitus, peripheral vascular disease and dyslipidemia. Patient reported 2-day history of worsening dyspnea to the point where he was symptomatic with minimal efforts, associated with cough and subjective fevers. On the  initial physical examination blood pressure 127/76, heart rate 95, respiratory rate 24, temperature 97.9, oxygen saturation 93%.His lungs had expiratory wheezing bilaterally, no rales, heart S1-S2 present and rhythmic, no gallops, rubs or murmurs, the abdomen was soft nontender, no lower extremity edema. Sodium 135, potassium 4.1, chloride 99, bicarb 27, glucose 194, BUN 11, creatinine 0.86,arterial blood gas 7.41/42.2/75.0/26.9/97.7%.White count 8.1, hemoglobin 14.5, hematocrit 44.6, platelets 253.Urinalysis negative for infection, Head CT with no acute changes.Chest x-ray with left upper lobe infiltrate, left lower lobe infiltrate. Chest CT with significant emphysematous  changes, left upper lobe left lower lobe infiltrates.Negative for pulmonary embolism. EKG with atrial fibrillation, normal axis.   Patient was admitted to the hospital with a working diagnosis of acute on chronic hypoxic respiratory failure due to multilobar pneumonia, complicated with acute COPD exacerbation.  1.  Acute on chronic hypoxic respiratory failure due to multilobar pneumonia, complicated by COPD exacerbation.  Patient was admitted to the medical ward, he was placed on a remote telemetry monitor.  He received  broad-spectrum antibiotic therapy with IV ceftriaxone and azithromycin.  Aggressive bronchodilator therapy with beta-2 agonist, anticholinergics and inhaled corticosteroids.  Oximetry monitoring and supplemental oxygen per nasal cannula.  He remained afebrile, his cultures were no growth, Streptococcus pneumonia and Legionella urinary antigens came back negative, respiratory viral panel was negative as well.  He responded well to the medical therapy, by May 12 he was deemed stable to discharge home, continue antibiotic therapy with Augmentin for 5 more days.  His oximetry at discharge was 94 to 95% on 2 L of supplemental oxygen per nasal cannula. He was seen by physical therapy with no further recommendations.  At discharge he will continue every other day azithromycin, and will add long-acting anticholinergic agent with tiotropium.   2.  Chronic atrial fibrillation.  Patient remained in atrial fibrillation rhythm, rate control with diltiazem, anticoagulation with aspirin.  3.  Diastolic heart failure.  No signs of exacerbation, echocardiogram was performed, results still pending.  Apparently patient of diuretics at home.  Continue blood pressure control with diltiazem.  4.  Coronary artery disease.  Patient remained chest pain-free, continue aspirin and statin therapy.  5.  Type 2 diabetes mellitus.  Patient was placed on insulin sliding scale for glucose coverage and monitoring,  his capillary glucose remained well controlled.   6.  Depression.  Continue buspirone and clonazepam, no confusion or agitation.  Discharge Diagnoses:  Active Problems:   Essential hypertension   Hx of CABG   Atrial fibrillation (HCC)   Acute respiratory failure with hypoxia (HCC)   Diastolic CHF ([place])   Hypertension   HCAP (healthcare-associated pneumonia)   COPD with acute exacerbation ([place])   Pulmonary nodules    Discharge Instructions   Allergies as of 08/06/2017      Reactions   Ticlopidine Hcl Swelling   Lorazepam Other (See Comments)   Altered mental status from 2 doses of IV Ativan on 05/10/14 HALLUCINATIONS AND DELIRIOUS   Other Other (See Comments)   Narcotics: Hallucinations and paranoia   Prednisone Other (See Comments)   NOT TRUE ALLERGY - CONFUSION      Medication List    STOP taking these medications   furosemide 40 MG tablet Commonly known as:  LASIX   potassium chloride SA 20 MEQ tablet Commonly known as:  K-DUR,KLOR-CON     TAKE these medications   acetaminophen 500 MG tablet Commonly known as:  TYLENOL Take 500 mg by mouth every 6 (six) hours as needed for mild pain or moderate pain.   amoxicillin-clavulanate 875-125 MG tablet Commonly known as:  AUGMENTIN Take 1 tablet by mouth 2 (two) times daily for 5 days.   arformoterol 15 MCG/2ML Nebu Commonly known as:  BROVANA Take 15 mcg by nebulization 2 (two) times daily.   aspirin 81 MG EC tablet Take 1 tablet (81 mg total) by mouth daily.   atorvastatin 40 MG tablet Commonly known as:  LIPITOR Take 40 mg by mouth daily.   azithromycin 250 MG tablet Commonly known as:  ZITHROMAX Take 500 mg by mouth every Monday, Wednesday, and Friday.   budesonide 0.5 MG/2ML nebulizer solution Commonly known as:  PULMICORT Take 0.5 mg by nebulization 2 (two) times daily.   busPIRone 7.5 MG tablet Commonly known as:  BUSPAR Take 7.5 mg by mouth 2 (two) times daily.   CALTRATE 600+D PLUS  600-400 MG-UNIT per tablet Chew 1 tablet by mouth 2 (two) times daily.   clonazePAM 0.5 MG tablet Commonly known as:  KLONOPIN Take 0.5 mg by mouth at bedtime.   dextromethorphan-guaiFENesin 10-100 MG/5ML liquid Commonly known as:  ROBITUSSIN-DM Take 10 mLs by mouth 3 (three) times daily as needed for cough.   diltiazem 240 MG 24 hr capsule Commonly known as:  CARDIZEM CD Take 1 capsule (240 mg total) by mouth daily.   eucerin cream Apply 1 application topically as needed for dry skin.   feeding supplement (GLUCERNA SHAKE) Liqd Take 237 mLs by mouth 3 (three) times daily between meals.   ferrous gluconate 324 MG tablet Commonly known as:  FERGON Take 324 mg by mouth 2 (two) times daily with a meal.   fluticasone 50 MCG/ACT nasal spray Commonly known as:  FLONASE Place 2 sprays into both nostrils daily.   LEVEMIR 100 UNIT/ML injection Generic drug:  insulin detemir Inject 3-5 Units into the skin See admin instructions. Before evening meal (dinner) AS NEEDED for high blood sugar   loperamide 1 MG/5ML solution Commonly known as:  IMODIUM Take 2 mg by mouth as needed for diarrhea or loose stools.   Melatonin 3 MG Tabs Take 6 mg by mouth at bedtime.   OXYGEN Inhale 2-4 L into the lungs See admin instructions. Inhale 2 L continuous during the day and 4 L at night for sleep   pantoprazole 40 MG tablet Commonly known as:  PROTONIX TAKE 1 TABLET BY MOUTH TWICE A DAY  PROAIR HFA 108 (90 Base) MCG/ACT inhaler Generic drug:  albuterol Inhale 2 puffs into the lungs every 4 (four) hours as needed for wheezing or shortness of breath.   tiotropium 18 MCG inhalation capsule Commonly known as:  SPIRIVA HANDIHALER Place 1 capsule (18 mcg total) into inhaler and inhale daily.   ZYRTEC ALLERGY 10 MG tablet Generic drug:  cetirizine Take 1 tablet (10 mg total) by mouth every morning.       Allergies  Allergen Reactions  . Ticlopidine Hcl Swelling  . Lorazepam Other (See  Comments)    Altered mental status from 2 doses of IV Ativan on 05/10/14 HALLUCINATIONS AND DELIRIOUS  . Other Other (See Comments)    Narcotics: Hallucinations and paranoia  . Prednisone Other (See Comments)    NOT TRUE ALLERGY - CONFUSION    Consultations:     Procedures/Studies: Dg Chest 2 View  Result Date: 08/03/2017 CLINICAL DATA:  Altered mental status EXAM: CHEST - 2 VIEW COMPARISON:  06/10/2016 FINDINGS: Patchy/masslike opacity in the posterior left upper lung. Additional multifocal patchy opacities in the left lower lobe. Right lung is essentially clear. The heart is top-normal in size. Postsurgical changes related to prior CABG. Median sternotomy. IMPRESSION: Patchy/masslike opacity in the posterior left upper lung, possibly reflecting pneumonia, although tumor is not excluded. Additional multifocal patchy opacities in the left lower lung, suggesting pneumonia. Consider CT chest with contrast for further evaluation. Electronically Signed   By: Julian Hy M.D.   On: 08/03/2017 23:44   Ct Head Wo Contrast  Result Date: 08/03/2017 CLINICAL DATA:  Right arm weakness and headache. EXAM: CT HEAD WITHOUT CONTRAST TECHNIQUE: Contiguous axial images were obtained from the base of the skull through the vertex without intravenous contrast. COMPARISON:  Head CT 07/16/2015 FINDINGS: Brain: No mass lesion, intraparenchymal hemorrhage or extra-axial collection. No evidence of acute cortical infarct. Periventricular hypoattenuation suggesting chronic microvascular disease. Vascular: No hyperdense vessel or unexpected vascular calcification. Skull: Normal visualized skull base, calvarium and extracranial soft tissues. Sinuses/Orbits: No sinus fluid levels or advanced mucosal thickening. No mastoid effusion. Normal orbits. IMPRESSION: Chronic small vessel disease without acute intracranial abnormality. Electronically Signed   By: Ulyses Jarred M.D.   On: 08/03/2017 20:14   Ct Angio Chest Pe W Or  Wo Contrast  Result Date: 08/04/2017 CLINICAL DATA:  Increased shortness of breath. Fever and chills with productive cough. EXAM: CT ANGIOGRAPHY CHEST WITH CONTRAST TECHNIQUE: Multidetector CT imaging of the chest was performed using the standard protocol during bolus administration of intravenous contrast. Multiplanar CT image reconstructions and MIPs were obtained to evaluate the vascular anatomy. CONTRAST:  39mL ISOVUE-370 IOPAMIDOL (ISOVUE-370) INJECTION 76% COMPARISON:  None. FINDINGS: Cardiovascular: Satisfactory opacification of the pulmonary arteries to the segmental level. No evidence of pulmonary embolism when accounting for levels of motion artifact most heavily affecting the bases. Mild cardiomegaly, most notably left atrial enlargement. Limited opacification of the left heart, which may account for the non opacified left atrial appendage. Extensive atherosclerotic calcification of the aorta and coronaries Mediastinum/Nodes: Mild enlargement of left hilar lymph node, presumably reactive in this setting. Moderate sliding hiatal hernia Lungs/Pleura: Consolidative and ground-glass opacity lower lobe. The history favors pneumonia, and imaging findings are supportive. Trace bilateral pleural fluid. Centrilobular and panlobular emphysema. Upper Abdomen: Bilateral renal cystic densities.  Cholecystectomy. Musculoskeletal: Remote bilateral rib fractures. Remote T7 and L1 body fractures. Advanced retropulsion at the L1 level, with implied cord impingement, also seen in 2017 Review of the MIP images confirms the above  findings. IMPRESSION: 1. No evidence of pulmonary embolism. Sensitivity is degraded by motion at the bases due to motion artifact. 2. Left-sided lung opacity favors pneumonia over mass. Followup PA and lateral chest X-ray is recommended in 3-4 weeks following trial of antibiotic therapy to ensure resolution and exclude underlying malignancy. 3. Aortic Atherosclerosis (ICD10-I70.0) and Emphysema  (ICD10-J43.9). 4. Remote T7 and L1 body fractures. The L1 fracture shows severe retropulsion and spinal impingement, also seen in 2017. Electronically Signed   By: Monte Fantasia M.D.   On: 08/04/2017 09:01   Mr Brain Wo Contrast  Result Date: 08/04/2017 CLINICAL DATA:  Confusion.  Diabetes and vascular disease. EXAM: MRI HEAD WITHOUT CONTRAST TECHNIQUE: Multiplanar, multiecho pulse sequences of the brain and surrounding structures were obtained without intravenous contrast. COMPARISON:  Head CT 08/03/2017 FINDINGS: Brain: Diffusion imaging shows 2 punctate acute infarctions within the left frontal subcortical white matter. No other acute infarction. Presumably these are micro embolic infarctions. Brainstem and cerebellum are normal. Cerebral hemispheres show age related atrophy and mild chronic small-vessel ischemic changes of the white matter. No large vessel territory infarction. No mass lesion, hemorrhage, hydrocephalus or extra-axial collection. Vascular: Major vessels at the base of the brain show flow. Skull and upper cervical spine: Negative Sinuses/Orbits: Clear/normal Other: None IMPRESSION: Two punctate acute infarctions in the left frontal subcortical white matter, likely micro embolic. Background pattern of brain atrophy and chronic small-vessel ischemic changes. No evidence of swelling or hemorrhage. Electronically Signed   By: Nelson Chimes M.D.   On: 08/04/2017 13:38       Subjective: Patient is feeling better, dyspnea is back to baseline, no nausea or vomiting. Continue to have productive cough. No fevers or chills.   Discharge Exam: Vitals:   08/06/17 0824 08/06/17 1038  BP: 121/72 136/67  Pulse: 97   Resp: 17   Temp: 98.2 F (36.8 C)   SpO2: 95%    Vitals:   08/06/17 0401 08/06/17 0427 08/06/17 0824 08/06/17 1038  BP: 107/69  121/72 136/67  Pulse: 79  97   Resp: 16  17   Temp: 97.7 F (36.5 C)  98.2 F (36.8 C)   TempSrc: Oral  Oral   SpO2: 95%  95%   Weight:  64.5  kg (142 lb 3.2 oz)    Height:        General: Not in pain or dyspnea Neurology: Awake and alert, non focal  E ENT: mild pallor, no icterus, oral mucosa moist Cardiovascular: No JVD. S1-S2 present, rhythmic, no gallops, rubs, or murmurs. No lower extremity edema. Pulmonary: decreased breath sounds bilaterally, decreased air movement, no wheezing, rhonchi or rales. Gastrointestinal. Abdomen with no organomegaly, non tender, no rebound or guarding Skin. No rashes Musculoskeletal: no joint deformities   The results of significant diagnostics from this hospitalization (including imaging, microbiology, ancillary and laboratory) are listed below for reference.     Microbiology: Recent Results (from the past 240 hour(s))  Culture, blood (routine x 2)     Status: None (Preliminary result)   Collection Time: 08/04/17 12:30 AM  Result Value Ref Range Status   Specimen Description BLOOD IV  Final   Special Requests   Final    BOTTLES DRAWN AEROBIC AND ANAEROBIC Blood Culture results may not be optimal due to an excessive volume of blood received in culture bottles   Culture   Final    NO GROWTH 2 DAYS Performed at [facility] [address], [place], [place] [address]  Report Status PENDING  Incomplete  Culture, blood (routine x 2)     Status: None (Preliminary result)   Collection Time: 08/04/17 12:45 AM  Result Value Ref Range Status   Specimen Description BLOOD SITE NOT SPECIFIED  Final   Special Requests   Final    BOTTLES DRAWN AEROBIC AND ANAEROBIC Blood Culture adequate volume   Culture   Final    NO GROWTH 2 DAYS Performed at [facility], [address], [place], [place] [address]    Report Status PENDING  Incomplete     Labs: BNP (last 3 results) Recent Labs    08/04/17 0416  BNP 672.0*   Basic Metabolic Panel: Recent Labs  Lab 08/03/17 1751 08/03/17 1805 08/04/17 0416 08/05/17 0343 08/06/17 0503  NA 135 136 140 139 140  K 4.1 4.3 4.1 4.1 4.3   CL 99* 96* 100* 101 99*  CO2 27  --  31 29 34*  GLUCOSE 194* 195* 156* 211* 138*  BUN 11 15 9 12 11   CREATININE 0.86 0.80 0.82 0.76 0.84  CALCIUM 8.6*  --  8.5* 8.5* 8.6*   Liver Function Tests: Recent Labs  Lab 08/03/17 1751  AST 21  ALT 19  ALKPHOS 52  BILITOT 0.8  PROT 6.3*  ALBUMIN 2.8*   No results for input(s): LIPASE, AMYLASE in the last 168 hours. No results for input(s): AMMONIA in the last 168 hours. CBC: Recent Labs  Lab 08/03/17 1751 08/03/17 1805 08/04/17 0416 08/05/17 0343 08/06/17 0503  WBC 8.1  --  7.6 9.6 8.0  NEUTROABS 6.0  --   --  6.9 5.6  HGB 14.5 15.0 13.9 12.7* 13.5  HCT 44.6 44.0 42.3 39.3 41.9  MCV 97.6  --  97.2 98.0 98.1  PLT 253  --  238 268 294   Cardiac Enzymes: Recent Labs  Lab 08/04/17 0416 08/04/17 0911  TROPONINI <0.03 <0.03   BNP: Invalid input(s): POCBNP CBG: Recent Labs  Lab 08/05/17 1533 08/05/17 2144 08/06/17 0604 08/06/17 0808 08/06/17 1219  GLUCAP 108* 180* 124* 124* 199*   D-Dimer Recent Labs    08/04/17 0416  DDIMER 3.08*   Hgb A1c No results for input(s): HGBA1C in the last 72 hours. Lipid Profile No results for input(s): CHOL, HDL, LDLCALC, TRIG, CHOLHDL, LDLDIRECT in the last 72 hours. Thyroid function studies Recent Labs    08/04/17 0416  TSH 1.923   Anemia work up No results for input(s): VITAMINB12, FOLATE, FERRITIN, TIBC, IRON, RETICCTPCT in the last 72 hours. Urinalysis    Component Value Date/Time   COLORURINE YELLOW 08/03/2017 2340   APPEARANCEUR HAZY (A) 08/03/2017 2340   LABSPEC 1.018 08/03/2017 2340   PHURINE 5.0 08/03/2017 2340   GLUCOSEU NEGATIVE 08/03/2017 2340   HGBUR NEGATIVE 08/03/2017 2340   HGBUR negative 02/04/2008 1027   BILIRUBINUR NEGATIVE 08/03/2017 2340   KETONESUR NEGATIVE 08/03/2017 2340   PROTEINUR NEGATIVE 08/03/2017 2340   UROBILINOGEN 1.0 08/23/2014 1712   NITRITE NEGATIVE 08/03/2017 2340   LEUKOCYTESUR NEGATIVE 08/03/2017 2340   Sepsis Labs Invalid  input(s): PROCALCITONIN,  WBC,  LACTICIDVEN Microbiology Recent Results (from the past 240 hour(s))  Culture, blood (routine x 2)     Status: None (Preliminary result)   Collection Time: 08/04/17 12:30 AM  Result Value Ref Range Status   Specimen Description BLOOD IV  Final   Special Requests   Final    BOTTLES DRAWN AEROBIC AND ANAEROBIC Blood Culture results may not be optimal due to an  excessive volume of blood received in culture bottles   Culture   Final    NO GROWTH 2 DAYS Performed at [facility], [place] [address], [place], [place] [address]    Report Status PENDING  Incomplete  Culture, blood (routine x 2)     Status: None (Preliminary result)   Collection Time: 08/04/17 12:45 AM  Result Value Ref Range Status   Specimen Description BLOOD SITE NOT SPECIFIED  Final   Special Requests   Final    BOTTLES DRAWN AEROBIC AND ANAEROBIC Blood Culture adequate volume   Culture   Final    NO GROWTH 2 DAYS Performed at [facility], [address], [place], [place] [address]    Report Status PENDING  Incomplete     Time coordinating discharge: 45 minutes  SIGNED:   Tawni Millers, MD  [facility] 08/06/2017, 12:21 PM Pager [phone]  If 7PM-7AM, please contact night-coverage [email or web address] Password [email or web address]

## 2017-08-06 NOTE — Progress Notes (Signed)
Pt discharged from unit by wheelchair. IV removed. Patient and son given discharge teaching and summary. No new concerns. Able to verbalize understanding and teach back. Son to transport home. Portable o2 tank delivered to room for d/c

## 2017-08-06 NOTE — Evaluation (Addendum)
Physical Therapy Evaluation & Discharge Patient Details Name: [name] MRN: [id number] DOB: [birth year] Today's Date: 08/06/2017   History of Present Illness  Pt is a 77 y.o. male admitted 08/03/17 with dyspnea and confusion. CXR shows multilobar pneumonia. MRI 5/10 shows two punctate infarctions in L frontal subcortical white matter, likely microembolic. PMH includes COPD, HF, CAD (s/p CABG), DM, PVD.    Clinical Impression  Patient evaluated by Physical Therapy with no further acute PT needs identified. PTA, pt mod indep with intermittent use of rollator; lives with son available for 24/7 assist. Today, pt ambulating throughout room and performing ADLs with no DME; intermittent supervision for balance. SpO2 92% on 2L O2 [place]. All education has been completed (including fall risk reduction, energy conservation techniques) and the patient has no further questions or concerns. Pt owns all necessary DME. Acute PT is signing off. Thank you for this referral.    Follow Up Recommendations No PT follow up;Supervision - Intermittent    Equipment Recommendations  None recommended by PT    Recommendations for Other Services       Precautions / Restrictions Precautions Precautions: Fall Restrictions Weight Bearing Restrictions: No      Mobility  Bed Mobility Overal bed mobility: Independent                Transfers Overall transfer level: Independent Equipment used: None                Ambulation/Gait Ambulation/Gait assistance: Supervision Ambulation Distance (Feet): 100 Feet Assistive device: None Gait Pattern/deviations: Step-through pattern;Decreased stride length Gait velocity: Decreased Gait velocity interpretation: <1.8 ft/sec, indicate of risk for recurrent falls General Gait Details: Initial amb with single UE support pushing IV pole; amb further with no UE support and supervision for balance  Stairs            Wheelchair Mobility    Modified Rankin  (Stroke Patients Only)       Balance Overall balance assessment: Needs assistance   Sitting balance-Leahy Scale: Good       Standing balance-Leahy Scale: Good                               Pertinent Vitals/Pain Pain Assessment: No/denies pain    Home Living Family/patient expects to be discharged to:: Private residence Living Arrangements: Children Available Help at Discharge: Family;Available 24 hours/day Type of Home: House Home Access: Ramped entrance     Home Layout: One level Home Equipment: Walker - 4 wheels;Walker - 2 wheels;Cane - single point;Tub bench Additional Comments: Wears 2L home O2    Prior Function Level of Independence: Independent with assistive device(s)         Comments: Indep with household amb; uses rollator for outside/community. Gets Meals-on-Wheels. Still drives occasionally; son does majority of driving     Hand Dominance        Extremity/Trunk Assessment   Upper Extremity Assessment Upper Extremity Assessment: Overall WFL for tasks assessed    Lower Extremity Assessment Lower Extremity Assessment: Overall WFL for tasks assessed       Communication   Communication: No difficulties  Cognition Arousal/Alertness: Awake/alert Behavior During Therapy: WFL for tasks assessed/performed Overall Cognitive Status: Within Functional Limits for tasks assessed  General Comments General comments (skin integrity, edema, etc.): SpO2 75% on RA (pt had removed to use bathroom), returning to 92% on 2L O2 [place]    Exercises     Assessment/Plan    PT Assessment Patent does not need any further PT services  PT Problem List         PT Treatment Interventions      PT Goals (Current goals can be found in the Care Plan section)  Acute Rehab PT Goals Patient Stated Goal: Return home PT Goal Formulation: All assessment and education complete, DC therapy    Frequency      Barriers to discharge        Co-evaluation               AM-PAC PT "6 Clicks" Daily Activity  Outcome Measure Difficulty turning over in bed (including adjusting bedclothes, sheets and blankets)?: None Difficulty moving from lying on back to sitting on the side of the bed? : None Difficulty sitting down on and standing up from a chair with arms (e.g., wheelchair, bedside commode, etc,.)?: None Help needed moving to and from a bed to chair (including a wheelchair)?: None Help needed walking in hospital room?: A Little Help needed climbing 3-5 steps with a railing? : A Little 6 Click Score: 22    End of Session Equipment Utilized During Treatment: Gait belt;Oxygen Activity Tolerance: Patient tolerated treatment well Patient left: in bed;with call bell/phone within reach;Other (comment)(sitting EOB eating lunch) Nurse Communication: Mobility status PT Visit Diagnosis: Other abnormalities of gait and mobility (R26.89)    Time: 1761-6073 PT Time Calculation (min) (ACUTE ONLY): 15 min   Charges:   PT Evaluation $PT Eval Moderate Complexity: 1 Mod     PT G Codes:       Mabeline Caras, PT, DPT Acute Rehab Services  Pager: [place] 08/06/2017, 12:50 PM

## 2017-08-09 LAB — CULTURE, BLOOD (ROUTINE X 2)
CULTURE: NO GROWTH
Culture: NO GROWTH
Special Requests: ADEQUATE

## 2018-03-15 ENCOUNTER — Emergency Department (HOSPITAL_COMMUNITY): Payer: Medicare Other

## 2018-03-15 ENCOUNTER — Other Ambulatory Visit: Payer: Self-pay

## 2018-03-15 ENCOUNTER — Inpatient Hospital Stay (HOSPITAL_COMMUNITY)
Admission: EM | Admit: 2018-03-15 | Discharge: 2018-03-19 | DRG: 190 | Disposition: A | Discharge status: Skilled Nursing Facility | Payer: Medicare Other | Attending: Internal Medicine | Admitting: Internal Medicine

## 2018-03-15 ENCOUNTER — Encounter (HOSPITAL_COMMUNITY): Payer: Self-pay

## 2018-03-15 DIAGNOSIS — J9610 Chronic respiratory failure, unspecified whether with hypoxia or hypercapnia: Secondary | ICD-10-CM | POA: Diagnosis not present

## 2018-03-15 DIAGNOSIS — J441 Chronic obstructive pulmonary disease with (acute) exacerbation: Secondary | ICD-10-CM | POA: Diagnosis not present

## 2018-03-15 DIAGNOSIS — E1151 Type 2 diabetes mellitus with diabetic peripheral angiopathy without gangrene: Secondary | ICD-10-CM | POA: Diagnosis present

## 2018-03-15 DIAGNOSIS — J440 Chronic obstructive pulmonary disease with acute lower respiratory infection: Secondary | ICD-10-CM | POA: Diagnosis present

## 2018-03-15 DIAGNOSIS — Z79899 Other long term (current) drug therapy: Secondary | ICD-10-CM

## 2018-03-15 DIAGNOSIS — Z951 Presence of aortocoronary bypass graft: Secondary | ICD-10-CM

## 2018-03-15 DIAGNOSIS — Z955 Presence of coronary angioplasty implant and graft: Secondary | ICD-10-CM

## 2018-03-15 DIAGNOSIS — G9341 Metabolic encephalopathy: Secondary | ICD-10-CM | POA: Diagnosis present

## 2018-03-15 DIAGNOSIS — I110 Hypertensive heart disease with heart failure: Secondary | ICD-10-CM | POA: Diagnosis present

## 2018-03-15 DIAGNOSIS — E785 Hyperlipidemia, unspecified: Secondary | ICD-10-CM | POA: Diagnosis present

## 2018-03-15 DIAGNOSIS — Z66 Do not resuscitate: Secondary | ICD-10-CM | POA: Diagnosis present

## 2018-03-15 DIAGNOSIS — K219 Gastro-esophageal reflux disease without esophagitis: Secondary | ICD-10-CM | POA: Diagnosis present

## 2018-03-15 DIAGNOSIS — Z888 Allergy status to other drugs, medicaments and biological substances status: Secondary | ICD-10-CM

## 2018-03-15 DIAGNOSIS — J209 Acute bronchitis, unspecified: Secondary | ICD-10-CM | POA: Diagnosis not present

## 2018-03-15 DIAGNOSIS — I4891 Unspecified atrial fibrillation: Secondary | ICD-10-CM | POA: Diagnosis not present

## 2018-03-15 DIAGNOSIS — R197 Diarrhea, unspecified: Secondary | ICD-10-CM | POA: Diagnosis present

## 2018-03-15 DIAGNOSIS — R05 Cough: Secondary | ICD-10-CM | POA: Diagnosis not present

## 2018-03-15 DIAGNOSIS — I480 Paroxysmal atrial fibrillation: Secondary | ICD-10-CM | POA: Diagnosis present

## 2018-03-15 DIAGNOSIS — R269 Unspecified abnormalities of gait and mobility: Secondary | ICD-10-CM | POA: Diagnosis present

## 2018-03-15 DIAGNOSIS — Z885 Allergy status to narcotic agent status: Secondary | ICD-10-CM

## 2018-03-15 DIAGNOSIS — R0602 Shortness of breath: Secondary | ICD-10-CM | POA: Diagnosis not present

## 2018-03-15 DIAGNOSIS — Z9981 Dependence on supplemental oxygen: Secondary | ICD-10-CM

## 2018-03-15 DIAGNOSIS — Z7982 Long term (current) use of aspirin: Secondary | ICD-10-CM

## 2018-03-15 DIAGNOSIS — I5032 Chronic diastolic (congestive) heart failure: Secondary | ICD-10-CM | POA: Diagnosis present

## 2018-03-15 DIAGNOSIS — Z87891 Personal history of nicotine dependence: Secondary | ICD-10-CM

## 2018-03-15 DIAGNOSIS — I2510 Atherosclerotic heart disease of native coronary artery without angina pectoris: Secondary | ICD-10-CM | POA: Diagnosis present

## 2018-03-15 DIAGNOSIS — R5381 Other malaise: Secondary | ICD-10-CM | POA: Diagnosis present

## 2018-03-15 DIAGNOSIS — R4182 Altered mental status, unspecified: Secondary | ICD-10-CM | POA: Diagnosis not present

## 2018-03-15 LAB — CBC
HCT: 46.9 % (ref 39.0–52.0)
Hemoglobin: 15 g/dL (ref 13.0–17.0)
MCH: 31.9 pg (ref 26.0–34.0)
MCHC: 32 g/dL (ref 30.0–36.0)
MCV: 99.8 fL (ref 80.0–100.0)
NRBC: 0 % (ref 0.0–0.2)
Platelets: 137 10*3/uL — ABNORMAL LOW (ref 150–400)
RBC: 4.7 MIL/uL (ref 4.22–5.81)
RDW: 13.3 % (ref 11.5–15.5)
WBC: 13.6 10*3/uL — ABNORMAL HIGH (ref 4.0–10.5)

## 2018-03-15 LAB — RESPIRATORY PANEL BY PCR
Adenovirus: NOT DETECTED
Bordetella pertussis: NOT DETECTED
CORONAVIRUS OC43-RVPPCR: NOT DETECTED
Chlamydophila pneumoniae: NOT DETECTED
Coronavirus 229E: NOT DETECTED
Coronavirus HKU1: NOT DETECTED
Coronavirus NL63: NOT DETECTED
Influenza A: NOT DETECTED
Influenza B: NOT DETECTED
MYCOPLASMA PNEUMONIAE-RVPPCR: NOT DETECTED
Metapneumovirus: NOT DETECTED
Parainfluenza Virus 1: NOT DETECTED
Parainfluenza Virus 2: NOT DETECTED
Parainfluenza Virus 3: NOT DETECTED
Parainfluenza Virus 4: NOT DETECTED
Respiratory Syncytial Virus: NOT DETECTED
Rhinovirus / Enterovirus: NOT DETECTED

## 2018-03-15 LAB — BASIC METABOLIC PANEL
Anion gap: 12 (ref 5–15)
BUN: 17 mg/dL (ref 8–23)
CO2: 27 mmol/L (ref 22–32)
CREATININE: 0.77 mg/dL (ref 0.61–1.24)
Calcium: 8.5 mg/dL — ABNORMAL LOW (ref 8.9–10.3)
Chloride: 99 mmol/L (ref 98–111)
GFR calc Af Amer: >60 mL/min (ref >60)
GFR calc non Af Amer: >60 mL/min (ref >60)
Glucose, Bld: 119 mg/dL — ABNORMAL HIGH (ref 70–99)
Potassium: 4.2 mmol/L (ref 3.5–5.1)
SODIUM: 138 mmol/L (ref 135–145)

## 2018-03-15 LAB — URINALYSIS, ROUTINE W REFLEX MICROSCOPIC
Bilirubin Urine: NEGATIVE
GLUCOSE, UA: NEGATIVE mg/dL
HGB URINE DIPSTICK: NEGATIVE
Ketones, ur: NEGATIVE mg/dL
Leukocytes, UA: NEGATIVE
Nitrite: NEGATIVE
PH: 5.5 (ref 5.0–8.0)
Protein, ur: NEGATIVE mg/dL
Specific Gravity, Urine: 1.025 (ref 1.005–1.030)

## 2018-03-15 LAB — AMMONIA: Ammonia: 10 umol/L (ref 9–35)

## 2018-03-15 LAB — I-STAT VENOUS BLOOD GAS, ED
Acid-Base Excess: 3 mmol/L — ABNORMAL HIGH (ref 0.0–2.0)
Bicarbonate: 30 mmol/L — ABNORMAL HIGH (ref 20.0–28.0)
O2 Saturation: 36 %
TCO2: 32 mmol/L (ref 22–32)
pCO2, Ven: 51.1 mmHg (ref 44.0–60.0)
pH, Ven: 7.376 (ref 7.250–7.430)
pO2, Ven: 22 mmHg — CL (ref 32.0–45.0)

## 2018-03-15 LAB — INFLUENZA PANEL BY PCR (TYPE A & B)
Influenza A By PCR: NEGATIVE
Influenza B By PCR: NEGATIVE

## 2018-03-15 LAB — I-STAT TROPONIN, ED: Troponin i, poc: 0.01 ng/mL (ref 0.00–0.08)

## 2018-03-15 LAB — PROCALCITONIN: Procalcitonin: 0.58 ng/mL

## 2018-03-15 MED ORDER — SODIUM CHLORIDE 3 % IN NEBU
4.0000 mL | INHALATION_SOLUTION | Freq: Three times a day (TID) | RESPIRATORY_TRACT | Status: DC
  Administered 2018-03-15 – 2018-03-16 (×2): 4 mL via RESPIRATORY_TRACT
  Filled 2018-03-15 (×3): qty 4

## 2018-03-15 MED ORDER — CALCIUM CARBONATE-VITAMIN D 500-200 MG-UNIT PO TABS
1.0000 | ORAL_TABLET | Freq: Two times a day (BID) | ORAL | Status: DC
  Administered 2018-03-16 – 2018-03-19 (×7): 1 via ORAL
  Filled 2018-03-15 (×7): qty 1

## 2018-03-15 MED ORDER — IPRATROPIUM-ALBUTEROL 0.5-2.5 (3) MG/3ML IN SOLN
3.0000 mL | Freq: Once | RESPIRATORY_TRACT | Status: AC
  Administered 2018-03-15: 3 mL via RESPIRATORY_TRACT
  Filled 2018-03-15: qty 3

## 2018-03-15 MED ORDER — ONDANSETRON HCL 4 MG/2ML IJ SOLN: 4.0000 mg | Freq: Four times a day (QID) | INTRAMUSCULAR | Status: DC | PRN

## 2018-03-15 MED ORDER — ALBUTEROL SULFATE (2.5 MG/3ML) 0.083% IN NEBU: 5.0000 mg | INHALATION_SOLUTION | Freq: Once | RESPIRATORY_TRACT | Status: DC

## 2018-03-15 MED ORDER — DM-GUAIFENESIN ER 30-600 MG PO TB12
2.0000 | ORAL_TABLET | Freq: Two times a day (BID) | ORAL | Status: DC
  Administered 2018-03-15 – 2018-03-19 (×8): 2 via ORAL
  Filled 2018-03-15 (×8): qty 2

## 2018-03-15 MED ORDER — IPRATROPIUM-ALBUTEROL 0.5-2.5 (3) MG/3ML IN SOLN
3.0000 mL | Freq: Four times a day (QID) | RESPIRATORY_TRACT | Status: DC
  Administered 2018-03-15 – 2018-03-19 (×15): 3 mL via RESPIRATORY_TRACT
  Filled 2018-03-15 (×16): qty 3

## 2018-03-15 MED ORDER — AZITHROMYCIN 250 MG PO TABS
500.0000 mg | ORAL_TABLET | Freq: Every day | ORAL | Status: AC
  Administered 2018-03-15: 500 mg via ORAL
  Filled 2018-03-15: qty 2

## 2018-03-15 MED ORDER — MELATONIN 3 MG PO TABS
6.0000 mg | ORAL_TABLET | Freq: Every day | ORAL | Status: DC
  Administered 2018-03-15 – 2018-03-18 (×4): 6 mg via ORAL
  Filled 2018-03-15 (×4): qty 2

## 2018-03-15 MED ORDER — CALTRATE 600+D PLUS 600-400 MG-UNIT PO CHEW: 1.0000 | CHEWABLE_TABLET | Freq: Two times a day (BID) | ORAL | Status: DC

## 2018-03-15 MED ORDER — HYDRALAZINE HCL 20 MG/ML IJ SOLN: 10.0000 mg | Freq: Four times a day (QID) | INTRAMUSCULAR | Status: DC | PRN

## 2018-03-15 MED ORDER — ENOXAPARIN SODIUM 40 MG/0.4ML ~~LOC~~ SOLN
40.0000 mg | SUBCUTANEOUS | Status: DC
  Administered 2018-03-15 – 2018-03-18 (×4): 40 mg via SUBCUTANEOUS
  Filled 2018-03-15 (×4): qty 0.4

## 2018-03-15 MED ORDER — AZITHROMYCIN 250 MG PO TABS
250.0000 mg | ORAL_TABLET | Freq: Every day | ORAL | Status: DC
  Administered 2018-03-16 – 2018-03-17 (×2): 250 mg via ORAL
  Filled 2018-03-15 (×2): qty 1

## 2018-03-15 MED ORDER — ARFORMOTEROL TARTRATE 15 MCG/2ML IN NEBU
15.0000 ug | INHALATION_SOLUTION | Freq: Two times a day (BID) | RESPIRATORY_TRACT | Status: DC
  Administered 2018-03-15 – 2018-03-19 (×8): 15 ug via RESPIRATORY_TRACT
  Filled 2018-03-15 (×9): qty 2

## 2018-03-15 MED ORDER — ATORVASTATIN CALCIUM 40 MG PO TABS
40.0000 mg | ORAL_TABLET | Freq: Every day | ORAL | Status: DC
  Administered 2018-03-15 – 2018-03-19 (×5): 40 mg via ORAL
  Filled 2018-03-15 (×5): qty 1

## 2018-03-15 MED ORDER — PANTOPRAZOLE SODIUM 40 MG PO TBEC
40.0000 mg | DELAYED_RELEASE_TABLET | Freq: Two times a day (BID) | ORAL | Status: DC
  Administered 2018-03-15 – 2018-03-16 (×2): 40 mg via ORAL
  Filled 2018-03-15 (×2): qty 1

## 2018-03-15 MED ORDER — FUROSEMIDE 20 MG PO TABS
20.0000 mg | ORAL_TABLET | Freq: Every day | ORAL | Status: DC
  Administered 2018-03-16: 20 mg via ORAL
  Filled 2018-03-15: qty 1

## 2018-03-15 MED ORDER — DILTIAZEM HCL ER COATED BEADS 180 MG PO CP24
180.0000 mg | ORAL_CAPSULE | Freq: Every day | ORAL | Status: DC
  Administered 2018-03-16 – 2018-03-19 (×4): 180 mg via ORAL
  Filled 2018-03-15 (×4): qty 1

## 2018-03-15 MED ORDER — ACETAMINOPHEN 325 MG PO TABS: 650.0000 mg | ORAL_TABLET | Freq: Four times a day (QID) | ORAL | Status: DC | PRN

## 2018-03-15 NOTE — ED Triage Notes (Signed)
Pt states couple weeks ago noted to be more "winded" than normal.  Came in to ER today because of home sats reading in the 60's at times with associated confusion (asked his son where his wife was that passed away 11 years ago). Pt does report remembering these instances of confusion and is a/o x 4 at this time. Sats 92% on 2 L (baseline home O2 rate). Does also report increased cough and congestion, breath sounds clear but diminished bilaterally

## 2018-03-15 NOTE — H&P (Signed)
History and Physical  [name] QAS:[id number] DOB: [birth year] DOA: 03/15/2018  Referring physician: Dr [name]  PCP: Jeffery Milan, MD  Outpatient Specialists: Cardiology, pulmonology Patient coming from: Home  Chief Complaint: Shortness of breath and productive cough  HPI: [name] is a 77 y.o. male with medical history significant for COPD on 2 L of oxygen by nasal cannula at baseline continuously, coronary artery disease status post CABG, hypertension, hyperlipidemia, chronic diastolic CHF, paroxysmal A. fib not on anticoagulation due to high fall risk who presented to [facility] ED with complaints of gradually worsening shortness of breath and productive cough with yellow mucus.  Patient denies fevers, chills, or sick contacts.  Denies chest pain or palpitations.  No recent lengthy trips.  Was admitted and treated for pneumonia in May 2019 and has had recurrent pneumonias in the past.  Symptoms associated with intermittent confusion.  ED Course: On presentation to the ED, patient appears uncomfortable due to dyspnea at rest.  Vital signs remarkable for tachypnea with RR of 24.  Lab studies remarkable for leukocytosis with WBC 13.6.  Chest x-ray done on presentation independently reviewed revealed no obvious lobular infiltrates.  Review of Systems: Review of systems as noted in the HPI. All other systems reviewed and are negative.   Past Medical History:  Diagnosis Date  . Alcohol abuse   . Allergic rhinitis   . ANEMIA, B12 DEFICIENCY 12/02/2008  . Arthritis   . CAD (coronary artery disease)    a.  s/p CABG 2011.  Marland Kitchen CAROTID ARTERY STENOSIS 04/16/2009  . Cataract   . Chronic diastolic CHF (congestive heart failure) ([place])   . Chronic respiratory failure ([place])   . COLONIC POLYPS 05/21/2007  . COPD, severe ([place])   . Diabetes mellitus without complication ([place])   . DIVERTICULAR DISEASE 05/21/2007  . Essential hypertension   . Falls   . GERD (gastroesophageal reflux disease)   . GI  BLEEDING 09/09/2008  . Hemoptysis   . HIATAL HERNIA 05/21/2007  . History of kidney stones   . HYPERLIPIDEMIA 11/21/2006  . Internal hemorrhoids   . MYOCARDIAL INFARCTION, HX OF 11/21/2006  . NEPHROLITHIASIS 05/21/2007  . Normocytic anemia   . NSVT (nonsustained ventricular tachycardia) ([place])    a. Holter 2015: NSR, A. Fib/flutter, short runs of NSVT  . OSTEOPOROSIS 11/21/2006  . Osteoporosis   . Paroxysmal atrial fibrillation ([place])    a. Anticoag stopped due to falls, rectal bleeding.  . Paroxysmal atrial flutter ([place])   . Pneumonia   . Pulmonary nodule    a. Dx 01/2015 -> advised to f/u PCP for PET.  Marland Kitchen PVD (peripheral vascular disease) ([place])    a. s/p prior iliac stenting.   Past Surgical History:  Procedure Laterality Date  . APPENDECTOMY    . CARDIOVERSION N/A 04/16/2014   Procedure: CARDIOVERSION;  Surgeon: Josue Hector, MD;  Location: [facility] ENDOSCOPY;  Service: Cardiovascular;  Laterality: N/A;  . CHOLECYSTECTOMY    . CORONARY ARTERY BYPASS GRAFT  2011  . CORONARY STENT PLACEMENT     03/07/2001  . HEMORRHOID SURGERY    . INGUINAL HERNIA REPAIR  10/10/08  . MASS EXCISION Left 07/09/2014   Procedure: LEFT ELBOW BURSECTOMY EXCISION MASS;  Surgeon: Iran Planas, MD;  Location: [place];  Service: Orthopedics;  Laterality: Left;  . OLECRANON BURSECTOMY  07/09/2014  . PTCA    . THYROIDECTOMY     partial    Social History:  reports that he quit smoking about 8 years ago.  His smoking use included cigarettes. He has a 150.00 pack-year smoking history. He has never used smokeless tobacco. He reports current alcohol use of about 6.0 standard drinks of alcohol per week. He reports that he does not use drugs.   Allergies  Allergen Reactions  . Ticlopidine Hcl Swelling  . Lorazepam Other (See Comments)    Altered mental status from 2 doses of IV Ativan on 05/10/14 HALLUCINATIONS AND DELIRIOUS  . Other Other (See Comments)    Narcotics: Hallucinations and paranoia  . Prednisone Other (See  Comments)    NOT TRUE ALLERGY - CONFUSION    Family History  Problem Relation Age of Onset  . Arthritis Mother        rheumatoid  . Heart attack Father   . Heart disease Father   . Heart disease Brother   . Colon cancer Brother 73  . Heart disease Brother   . Stomach cancer Neg Hx       Prior to Admission medications   Medication Sig Start Date End Date Taking? Authorizing Provider  acetaminophen (TYLENOL) 500 MG tablet Take 500 mg by mouth every 6 (six) hours as needed for mild pain or moderate pain.   Yes [provider]  albuterol (PROAIR HFA) 108 (90 Base) MCG/ACT inhaler Inhale 2 puffs into the lungs every 4 (four) hours as needed for wheezing or shortness of breath.    Yes [provider]  arformoterol (BROVANA) 15 MCG/2ML NEBU Take 15 mcg by nebulization 2 (two) times daily.   Yes [provider]  atorvastatin (LIPITOR) 40 MG tablet Take 40 mg by mouth daily.   Yes [provider]  azithromycin (ZITHROMAX) 250 MG tablet Take 500 mg by mouth every Monday, Wednesday, and Friday.   Yes [provider]  Calcium Carbonate-Vit D-Min (CALTRATE 600+D PLUS) 600-400 MG-UNIT per tablet Chew 1 tablet by mouth 2 (two) times daily.    Yes [provider]  cetirizine (ZYRTEC ALLERGY) 10 MG tablet Take 1 tablet (10 mg total) by mouth every morning. 08/17/10  Yes Parrett, Tammy S, NP  clonazePAM (KLONOPIN) 0.5 MG tablet Take 0.5 mg by mouth at bedtime.    Yes [provider]  diltiazem (CARDIZEM CD) 180 MG 24 hr capsule Take 180 mg by mouth daily.  01/21/16  Yes Sherren Mocha, MD  furosemide (LASIX) 20 MG tablet Take 20 mg by mouth daily.   Yes [provider]  guaifenesin (HUMIBID E) 400 MG TABS tablet Take 400 mg by mouth every 4 (four) hours.   Yes [provider]  LACTOBACILLUS PO Take 2 capsules by mouth daily.   Yes [provider]  loperamide (IMODIUM) 2 MG capsule Take 2 mg by mouth as needed for  diarrhea or loose stools.    Yes [provider]  Melatonin 3 MG TABS Take 6 mg by mouth at bedtime.   Yes [provider]  OXYGEN Inhale 2-4 L into the lungs See admin instructions. Inhale 2 L continuous during the day and 4 L at night for sleep   Yes [provider]  pantoprazole (PROTONIX) 40 MG tablet TAKE 1 TABLET BY MOUTH TWICE A DAY Patient taking differently: Take 40 mg by mouth 2 (two) times daily.  05/19/14  Yes Marin Olp, MD  potassium chloride SA (K-DUR,KLOR-CON) 20 MEQ tablet Take 20 mEq by mouth daily.   Yes [provider]  aspirin EC 81 MG EC tablet Take 1 tablet (81 mg total) by mouth daily.  Patient not taking: Reported on 06/10/2016 02/25/16   Cristal Ford, DO  feeding supplement, GLUCERNA SHAKE, (GLUCERNA SHAKE) LIQD Take 237 mLs by mouth 3 (three) times daily between meals. 06/14/16   Molt, Bethany, DO    Physical Exam: BP 129/66   Pulse 62   Temp 97.7 F (36.5 C) (Oral)   Resp (!) 22   Ht 5\' 3"  (1.6 m)   Wt 64 kg   SpO2 93%   BMI 24.98 kg/m   . General: 77 y.o. year-old male well developed well nourished in no acute distress.  Alert and oriented x3. . Cardiovascular: Regular rate and rhythm with no rubs or gallops.  No thyromegaly or JVD noted.  No lower extremity edema. 2/4 pulses in all 4 extremities. Marland Kitchen Respiratory: Mild rales at bases with no wheezes.  Poor inspiratory effort. . Abdomen: Soft nontender nondistended with normal bowel sounds x4 quadrants. . Muskuloskeletal: No cyanosis, clubbing or edema noted bilaterally . Neuro: CN II-XII intact, strength, sensation, reflexes . Skin: No ulcerative lesions noted or rashes . Psychiatry: Judgement and insight appear normal. Mood is appropriate for condition and setting          Labs on Admission:  Basic Metabolic Panel: Recent Labs  Lab 03/15/18 1351  NA 138  K 4.2  CL 99  CO2 27  GLUCOSE 119*  BUN 17  CREATININE 0.77  CALCIUM 8.5*   Liver Function  Tests: No results for input(s): AST, ALT, ALKPHOS, BILITOT, PROT, ALBUMIN in the last 168 hours. No results for input(s): LIPASE, AMYLASE in the last 168 hours. Recent Labs  Lab 03/15/18 1524  AMMONIA 10   CBC: Recent Labs  Lab 03/15/18 1351  WBC 13.6*  HGB 15.0  HCT 46.9  MCV 99.8  PLT 137*   Cardiac Enzymes: No results for input(s): CKTOTAL, CKMB, CKMBINDEX, TROPONINI in the last 168 hours.  BNP (last 3 results) Recent Labs    08/04/17 0416  BNP 158.6*    ProBNP (last 3 results) No results for input(s): PROBNP in the last 8760 hours.  CBG: No results for input(s): GLUCAP in the last 168 hours.  Radiological Exams on Admission: Dg Chest 2 View  Result Date: 03/15/2018 CLINICAL DATA:  Cough and shortness of breath EXAM: CHEST - 2 VIEW COMPARISON:  Chest radiograph Aug 03, 2017 and chest CT Aug 04, 2017 FINDINGS: There are areas of patchy fibrosis bilaterally and areas of scarring. Underlying emphysematous changes are noted, better seen on prior CT. Lungs are hyperexpanded with prominence of the retrosternal clear space. There is no frank edema or consolidation. Heart is upper normal in size with pulmonary vascularity normal. No adenopathy. Patient is status post coronary artery bypass grafting. There is aortic atherosclerosis. There is a small hiatal hernia. There is collapse of the T7 vertebral body, stable. There is also collapsed of the L1 vertebral body. There are surgical clips in the left thyroid region. IMPRESSION: 1. Underlying emphysematous change with areas of fibrosis and scarring. No frank edema or consolidation currently. 2. stable cardiac silhouette. There is aortic atherosclerosis. Patient is status post coronary artery bypass grafting. 3.  Focal hiatal hernia. 4.  Stable collapse of the T7 and L1 vertebral bodies. Aortic Atherosclerosis (ICD10-I70.0) and Emphysema (ICD10-J43.9). Electronically Signed   By: Lowella Grip III M.D.   On: 03/15/2018 14:28   Ct  Head Wo Contrast  Result Date: 03/15/2018 CLINICAL DATA:  Altered mental status EXAM: CT HEAD WITHOUT CONTRAST TECHNIQUE: Contiguous axial images were obtained from  the base of the skull through the vertex without intravenous contrast. COMPARISON:  CT 08/03/2017 FINDINGS: Brain: Mild to moderate atrophy unchanged. Negative for hydrocephalus. Chronic white matter changes stable. Negative for acute infarct, hemorrhage, or mass. No midline shift. Vascular: Atherosclerotic calcification. Negative for hyperdense vessel Skull: Negative Sinuses/Orbits: Paranasal sinuses clear. No orbital lesion. Cataract surgery on the right. Other: None IMPRESSION: No acute intracranial abnormality. Atrophy and chronic microvascular ischemia. Electronically Signed   By: Franchot Gallo M.D.   On: 03/15/2018 15:41    EKG: I independently viewed the EKG done and my findings are as followed: None available at the time of this dictation.  Assessment/Plan Present on Admission: . COPD exacerbation (HCC)  Active Problems:   COPD exacerbation (HCC)  Acute COPD exacerbation of unclear etiology Will obtain respiratory viral panel and influenza a and B PCR Obtain procalcitonin level to assess for active bacterial lung infection Chest x-ray done on 03/15/2018 independently reviewed revealed no lobular infiltrates Start Z-Pak for its anti-inflammatory properties Start duo nebs every 6 hours Start pulmonary toilet with Mucinex 1200 mg twice daily, hypersaline nebs 3 times daily, and chest PT twice daily Resume home COPD medications Maintain O2 saturation greater than 92% Patient self reports intolerance to prednisone and will preferred not to receive it.  We will hold it for now  Leukocytosis, could be reactive Obtain procalcitonin to rule out any active bacterial infection Obtain viral panel Repeat CBC in the morning  Coronary artery disease status post CABG Resume cardiac medications  Chronic diastolic  CHF Euvolemic Strict I's and O's and daily weight Continue cardiac medications  Hypertension Blood pressure is stable Resume antihypertensive medications  Ambulatory dysfunction/physical debility PT to assess Fall precautions  Intermittent acute metabolic encephalopathy Patient is alert and oriented x3 today Continue close monitoring Obtain UA and urine culture    DVT prophylaxis: Subcu Lovenox daily  Code Status: DNR  Family Communication: Family at bedside.  Disposition Plan: Admit to medical telemetry unit  Consults called: None  Admission status: Observation status    Kayleen Memos MD [facility] Pager [phone]  If 7PM-7AM, please contact night-coverage [email or web address] Password [email or web address]  03/15/2018, 6:02 PM

## 2018-03-15 NOTE — ED Provider Notes (Signed)
Medical screening examination/treatment/procedure(s) were conducted as a shared visit with non-physician practitioner(s) and myself.  I personally evaluated the patient during the encounter.  EKG Interpretation  Date/Time:  Thursday March 15 2018 12:48:04 EST Ventricular Rate:  58 PR Interval:    QRS Duration: 108 QT Interval:  420 QTC Calculation: 395 R Axis:   25 Text Interpretation:  Atrial fibrillation Anterior infarct, old Repol abnrm suggests ischemia, lateral leads Since last EKG, no significant change although baseline wander in precordial leads of prior EKG limits direct comparison Confirmed by Duffy Bruce [phone]) on 03/15/2018 1:06:15 PM  Patient seen by me along with the physician assistant.  Patient is got a require admission for COPD exacerbation.  Chronic respiratory failure normally on 2 L of oxygen at home currently requiring 4 L of oxygen to sustain oxygen saturations around 92%.  Patient in no acute distress.  Patient's venous blood gas showed a low PO2 but had good pH and good PCO2.  Patient is doing fine currently on the 4 L of oxygen.  EKG showed evidence of some atrial fibrillation but rate controlled at 58.  Chest x-ray without evidence of pneumonia.  Patient had CT head due to some mental status changes we think is probably secondary to the hypoxia.  CT had no acute findings.  Patient slight leukocytosis.  White count was 13.6.  Patient overall with significant improvement.  No wheezing bilaterally.   Fredia Sorrow, MD 03/15/18 1759

## 2018-03-15 NOTE — ED Provider Notes (Addendum)
Care transferred from [facility], PA-C at shift change. See note for full HPI.  In summation 77 year old male with history of CAD, COPD, diabetes, hypertension, lipidemia, MI who presents for evaluation of shortness of breath.  Patient states he has been getting easily short winded over the last 2 days. Has had productive cough of white sputum.  Denies fever, chills, chest pain, nausea vomiting, abdominal pain, slurred speech, headache, vision changes, weakness, numbness or tingling in his extremities.  Patient son states that patient is also been confused over the last 2 weeks.  Denies rhinorrhea or nasal congestion.  Troponin negative, CBC with leukocytosis at 40.8, metabolic panel with glucose at 119.  Ammonia, head CT, blood gas, chest x-ray, EKG pending at care transfer.  Patient does use intermittent home oxygen of 2 L with ambulation.  He states patient's home oxygen saturations were reading in the 60s and 70s intermittently despite using his home oxygen.  Patient does ambulate with walker at baseline.  Patient will likely need to be admitted for COPD exacerbation.  Patient will also need placement at DC.  Physical Exam  BP (!) 144/62 (BP Location: Left Arm)   Pulse (!) 59   Temp (!) 97.4 F (36.3 C) (Oral)   Resp (!) 26   Ht 5\' 3"  (1.6 m)   Wt 63.5 kg   SpO2 96%   BMI 24.80 kg/m   Physical Exam Vitals signs and nursing note reviewed.  Constitutional:      General: He is not in acute distress.    Appearance: He is well-developed. He is not diaphoretic.  HENT:     Head: Atraumatic.     Mouth/Throat:     Mouth: Mucous membranes are moist.     Pharynx: Oropharynx is clear.  Eyes:     Pupils: Pupils are equal, round, and reactive to light.  Neck:     Musculoskeletal: Normal range of motion and neck supple.     Comments: Full active and passive ROM without pain No midline or paraspinal tenderness No nuchal rigidity or meningeal signs  Cardiovascular:     Rate and Rhythm:  Normal rate and regular rhythm.  Pulmonary:     Effort: Pulmonary effort is normal. Tachypnea present. No accessory muscle usage or respiratory distress.     Breath sounds: No stridor. Decreased breath sounds and wheezing present. No rhonchi or rales.  Chest:     Chest wall: No deformity, tenderness, crepitus or edema. There is no dullness to percussion.  Abdominal:     General: Bowel sounds are normal. There is no distension.     Palpations: Abdomen is soft.     Tenderness: There is no abdominal tenderness.  Musculoskeletal: Normal range of motion.  Skin:    General: Skin is warm and dry.  Neurological:     Mental Status: He is alert.     Motor: No weakness.     Comments: Mental Status:  Alert, oriented, thought content appropriate. Speech fluent without evidence of aphasia. Able to follow 2 step commands without difficulty.  Cranial Nerves:  II:  Peripheral visual fields grossly normal, pupils equal, round, reactive to light III,IV, VI: ptosis not present, extra-ocular motions intact bilaterally  V,VII: smile symmetric, facial light touch sensation equal VIII: hearing grossly normal bilaterally  IX,X: midline uvula rise  XI: bilateral shoulder shrug equal and strong XII: midline tongue extension  Motor:  5/5 in upper and lower extremities bilaterally including strong and equal grip strength and dorsiflexion/plantar flexion Sensory:  Pinprick and light touch normal in all extremities.  Deep Tendon Reflexes: 2+ and symmetric  Cerebellar: normal finger-to-nose with bilateral upper extremities Gait: normal gait and balance CV: distal pulses palpable throughout       ED Course/Procedures   Clinical Course as of Mar 15 2101  Thu Mar 15, 2018  2101 Negative  CT Head Wo Contrast [BH]  2101 negative  DG Chest 2 View [BH]    Clinical Course User Index [BH] Annina Piotrowski A, PA-C   Dg Chest 2 View  Result Date: 03/15/2018 CLINICAL DATA:  Cough and shortness of breath EXAM:  CHEST - 2 VIEW COMPARISON:  Chest radiograph Aug 03, 2017 and chest CT Aug 04, 2017 FINDINGS: There are areas of patchy fibrosis bilaterally and areas of scarring. Underlying emphysematous changes are noted, better seen on prior CT. Lungs are hyperexpanded with prominence of the retrosternal clear space. There is no frank edema or consolidation. Heart is upper normal in size with pulmonary vascularity normal. No adenopathy. Patient is status post coronary artery bypass grafting. There is aortic atherosclerosis. There is a small hiatal hernia. There is collapse of the T7 vertebral body, stable. There is also collapsed of the L1 vertebral body. There are surgical clips in the left thyroid region. IMPRESSION: 1. Underlying emphysematous change with areas of fibrosis and scarring. No frank edema or consolidation currently. 2. stable cardiac silhouette. There is aortic atherosclerosis. Patient is status post coronary artery bypass grafting. 3.  Focal hiatal hernia. 4.  Stable collapse of the T7 and L1 vertebral bodies. Aortic Atherosclerosis (ICD10-I70.0) and Emphysema (ICD10-J43.9). Electronically Signed   By: Lowella Grip III M.D.   On: 03/15/2018 14:28   Ct Head Wo Contrast  Result Date: 03/15/2018 CLINICAL DATA:  Altered mental status EXAM: CT HEAD WITHOUT CONTRAST TECHNIQUE: Contiguous axial images were obtained from the base of the skull through the vertex without intravenous contrast. COMPARISON:  CT 08/03/2017 FINDINGS: Brain: Mild to moderate atrophy unchanged. Negative for hydrocephalus. Chronic white matter changes stable. Negative for acute infarct, hemorrhage, or mass. No midline shift. Vascular: Atherosclerotic calcification. Negative for hyperdense vessel Skull: Negative Sinuses/Orbits: Paranasal sinuses clear. No orbital lesion. Cataract surgery on the right. Other: None IMPRESSION: No acute intracranial abnormality. Atrophy and chronic microvascular ischemia. Electronically Signed   By: Franchot Gallo M.D.   On: 03/15/2018 15:41    Procedures Labs Reviewed  BASIC METABOLIC PANEL - Abnormal; Notable for the following components:      Result Value   Glucose, Bld 119 (*)    Calcium 8.5 (*)    All other components within normal limits  CBC - Abnormal; Notable for the following components:   WBC 13.6 (*)    Platelets 137 (*)    All other components within normal limits  I-STAT VENOUS BLOOD GAS, ED - Abnormal; Notable for the following components:   pO2, Ven 22.0 (*)    Bicarbonate 30.0 (*)    Acid-Base Excess 3.0 (*)    All other components within normal limits  RESPIRATORY PANEL BY PCR  URINE CULTURE  AMMONIA  PROCALCITONIN  INFLUENZA PANEL BY PCR (TYPE A & B)  URINALYSIS, ROUTINE W REFLEX MICROSCOPIC  CBC WITH DIFFERENTIAL/PLATELET  BASIC METABOLIC PANEL  I-STAT TROPONIN, ED    MDM  77 year old male presents for evaluation of shortness of breath and cough.  History of COPD.  WBC at 40.1, metabolic panel with mild elevation glucose at 119, troponin negative.  Ammonia, blood gas, chest x-ray, EKG, head  CT pending at care transfer.  Patient has had an increase in shortness of breath from his baseline.  Uses intermittent 2 L of oxygen at home at baseline.  Has been doing his home DuoNeb's as well as rescue inhalers without relief of symptoms.  Patient has received 2 nebulizer treatments prior to my onset of care.  On my evaluation patient does have decreased breath sounds more so left lung than right.  He does have moderate bilateral wheezing.  Will give additional nebulizer.   Head CT negative, confusion likely secondary to hypoxia., iStat blood gas with PO2 22, bicarb 30, normal pH.  She does not appear in any acute respiratory distress.  He is on 4 L of oxygen.  Able to talk in full sentences without difficulty.  Chest x-ray with emphysematous changes, no focal consolidation, CHF or pulmonary edema.  Patient likely with COPD exacerbation.  Ammonia10.  Patient's confusion  likely due to hypoxia.  Afebrile, nonseptic appearing.  Low suspicion for infectious pathology such as pneumonia causing patient's symptoms.  Patient does not meet Sirs or sepsis criteria.  Abdomen, nontender without rebound or guarding.  Nonfocal neurologic exam without neurologic deficits.  No lower extremity edema. Low suspicion for CVA, ACS, pneumonia, PE causing patient's symptoms.  Will consult with hospitalist for admission for COPD exacerbation.  1723: Dr. Nevada Crane with [facility] agrees admission for COPD exacerbation    Alontae Chaloux A, PA-C 03/15/18 2059    Sujay Grundman A, PA-C 03/15/18 2106    Fredia Sorrow, MD 03/16/18 [phone]

## 2018-03-15 NOTE — ED Provider Notes (Addendum)
[facility] EMERGENCY DEPARTMENT Provider Note   CSN: [id number] Arrival date & time: 03/15/18  1232     History   Chief Complaint Chief Complaint  Patient presents with  . Shortness of Breath  . Altered Mental Status    HPI [name] is a 77 y.o. male with a history of diabetes mellitus, COPD, chronic respiratory failure on 2L home O2 who presents to the emergency department with a chief complaint of shortness of breath.  The patient states that he has been feeling more short of breath over the last few days.  Family states that his home sats were reading in the 60s intermittently today despite using his home oxygen.  He reports associated constant, worsening productive cough and congestion that also began over the last few days.  He denies fever, chills, myalgias, headache, chest pain, chest tightness, orthopnea, lower extremity edema, abdominal pain, nausea, vomiting, or diarrhea.  Family also has expressed concerns because the patient has been asking friends and family to buy him over-the-counter cough syrup.  He reports that he has gone through several bottles of Robitussin in the last few weeks.  He reports that the patient's son is stopped by the medication for him so he has started asking others to make purchases for him.  He also reports that he has been using his home albuterol inhaler more frequently since his symptoms worsened over the last few days.  He states that sometimes he will use it every hour for a couple of hours and then not use until the next day.  Family is concerned because the patient lives at home with his son.  He ambulates with a walker at baseline.  He requires much assistance with his ADLs.  Family reports the patient's son is expected to have back surgery in the next few weeks, and there are concerns about the patient's care at home, particularly in the setting of worsening symptoms.  The patient's son is not present today, but provides a  note detailing the patient's symptoms and changes over the last few weeks.  Of note, the patient's son reports that the patient cut his home oxygen tubing twice in the last month, which required the servicing company to be called out to the house twice.  When asked about the details of the event, the patient states " I do not really recall what happened."  He states that he was not attempting to hurt or harm himself.  He denies SI, HI, or auditory hallucinations.  He does endorse visual hallucinations that have gradually worsened over the last 6 months.  He states that he has been seeing a smaller version of his wife who passed away 11 years ago and his younger brother who passed away a few years ago.  He reports that he has had hallucinations in the past when taking prednisone, but no recent courses of corticosteroids.  Family does report that the patient has seemed more confused over the last few days.  They report that he was asking where his son was earlier today when they knew that he was following up regarding his upcoming back surgery.  He also endorses low mood, decreased appetite, and worsening insomnia.  The patient states that he also has concerns regarding his son's upcoming surgery.  He does not feel that he would be able to perform his activities of daily living without his son's assistance.  He does not currently have home health in place.  He reports that  he has interest in going into an assisted living facility as he has been in and out of different facilities several times over the last few years.  Primary care is at the [facility].  The history is provided by the patient. No language interpreter was used.    Past Medical History:  Diagnosis Date  . Alcohol abuse   . Allergic rhinitis   . ANEMIA, B12 DEFICIENCY 12/02/2008  . Arthritis   . CAD (coronary artery disease)    a.  s/p CABG 2011.  Marland Kitchen CAROTID ARTERY STENOSIS 04/16/2009  . Cataract   . Chronic diastolic CHF (congestive heart  failure) ([place])   . Chronic respiratory failure ([place])   . COLONIC POLYPS 05/21/2007  . COPD, severe ([place])   . Diabetes mellitus without complication ([place])   . DIVERTICULAR DISEASE 05/21/2007  . Dysrhythmia   . Essential hypertension   . Falls   . GERD (gastroesophageal reflux disease)   . GI BLEEDING 09/09/2008  . Hemoptysis   . HIATAL HERNIA 05/21/2007  . History of kidney stones   . HYPERLIPIDEMIA 11/21/2006  . Internal hemorrhoids   . MYOCARDIAL INFARCTION, HX OF 11/21/2006  . NEPHROLITHIASIS 05/21/2007  . Normocytic anemia   . NSVT (nonsustained ventricular tachycardia) ([place])    a. Holter 2015: NSR, A. Fib/flutter, short runs of NSVT  . OSTEOPOROSIS 11/21/2006  . Osteoporosis   . Paroxysmal atrial fibrillation ([place])    a. Anticoag stopped due to falls, rectal bleeding.  . Paroxysmal atrial flutter ([place])   . Pneumonia   . Pulmonary nodule    a. Dx 01/2015 -> advised to f/u PCP for PET.  Marland Kitchen PVD (peripheral vascular disease) ([place])    a. s/p prior iliac stenting.    Patient Active Problem List   Diagnosis Date Noted  . Pulmonary nodules   . COPD with acute exacerbation ([place]) 06/10/2016  . Chest pain 02/23/2016  . Lung nodule, solitary   . HCAP (healthcare-associated pneumonia)   . Iron deficiency anemia 02/15/2015  . CAD (coronary artery disease) 09/18/2014  . Compression fracture of L1 lumbar vertebra ([facility])   . Syncope 08/23/2014  . Low back pain 08/23/2014  . Hypertension 08/23/2014  . COPD (chronic obstructive pulmonary disease) ([place]) 08/23/2014  . Chronic atrial fibrillation 08/23/2014  . Syncope and collapse   . Olecranon bursitis 07/09/2014  . Diabetes mellitus type II, controlled ([place]) 07/01/2014  . Chronic anticoagulation 05/29/2014  . Acute respiratory failure with hypoxia ([place]) 05/14/2014  . Diastolic CHF ([place]) 32/95/1884  . COPD exacerbation ([place])   . CAP (community acquired pneumonia) 04/03/2014  . Allergic rhinitis 02/06/2014  . GERD (gastroesophageal  reflux disease) 02/06/2014  . Do not resuscitate 02/06/2014  . Erectile dysfunction 06/18/2010  . Atrial fibrillation ([place]) 11/06/2009  . Peripheral vascular disease ([place]) 09/02/2009  . Carotid artery stenosis 04/16/2009  . ANEMIA, B12 DEFICIENCY 12/02/2008  . COPD GOLD III 05/21/2007  . TOBACCO USE 01/19/2007  . Hyperlipidemia 11/21/2006  . Essential hypertension 11/21/2006  . Hx of CABG 11/21/2006  . Osteoporosis 11/21/2006    Past Surgical History:  Procedure Laterality Date  . APPENDECTOMY    . CARDIOVERSION N/A 04/16/2014   Procedure: CARDIOVERSION;  Surgeon: Josue Hector, MD;  Location: [facility] ENDOSCOPY;  Service: Cardiovascular;  Laterality: N/A;  . CHOLECYSTECTOMY    . CORONARY ARTERY BYPASS GRAFT  2011  . CORONARY STENT PLACEMENT     03/07/2001  . HEMORRHOID SURGERY    . INGUINAL HERNIA REPAIR  10/10/08  .  MASS EXCISION Left 07/09/2014   Procedure: LEFT ELBOW BURSECTOMY EXCISION MASS;  Surgeon: Iran Planas, MD;  Location: [facility];  Service: Orthopedics;  Laterality: Left;  . OLECRANON BURSECTOMY  07/09/2014  . PTCA    . THYROIDECTOMY     partial        Home Medications    Prior to Admission medications   Medication Sig Start Date End Date Taking? Authorizing Provider  acetaminophen (TYLENOL) 500 MG tablet Take 500 mg by mouth every 6 (six) hours as needed for mild pain or moderate pain.   Yes [provider]  albuterol (PROAIR HFA) 108 (90 Base) MCG/ACT inhaler Inhale 2 puffs into the lungs every 4 (four) hours as needed for wheezing or shortness of breath.    Yes [provider]  arformoterol (BROVANA) 15 MCG/2ML NEBU Take 15 mcg by nebulization 2 (two) times daily.   Yes [provider]  atorvastatin (LIPITOR) 40 MG tablet Take 40 mg by mouth daily.   Yes [provider]  azithromycin (ZITHROMAX) 250 MG tablet Take 500 mg by mouth every Monday, Wednesday, and Friday.   Yes [provider]  Calcium Carbonate-Vit D-Min  (CALTRATE 600+D PLUS) 600-400 MG-UNIT per tablet Chew 1 tablet by mouth 2 (two) times daily.    Yes [provider]  cetirizine (ZYRTEC ALLERGY) 10 MG tablet Take 1 tablet (10 mg total) by mouth every morning. 08/17/10  Yes Parrett, Tammy S, NP  clonazePAM (KLONOPIN) 0.5 MG tablet Take 0.5 mg by mouth at bedtime.    Yes [provider]  diltiazem (CARDIZEM CD) 180 MG 24 hr capsule Take 180 mg by mouth daily.  01/21/16  Yes Sherren Mocha, MD  furosemide (LASIX) 20 MG tablet Take 20 mg by mouth daily.   Yes [provider]  guaifenesin (HUMIBID E) 400 MG TABS tablet Take 400 mg by mouth every 4 (four) hours.   Yes [provider]  LACTOBACILLUS PO Take 2 capsules by mouth daily.   Yes [provider]  loperamide (IMODIUM) 2 MG capsule Take 2 mg by mouth as needed for diarrhea or loose stools.    Yes [provider]  Melatonin 3 MG TABS Take 6 mg by mouth at bedtime.   Yes [provider]  OXYGEN Inhale 2-4 L into the lungs See admin instructions. Inhale 2 L continuous during the day and 4 L at night for sleep   Yes [provider]  pantoprazole (PROTONIX) 40 MG tablet TAKE 1 TABLET BY MOUTH TWICE A DAY Patient taking differently: Take 40 mg by mouth 2 (two) times daily.  05/19/14  Yes Marin Olp, MD  potassium chloride SA (K-DUR,KLOR-CON) 20 MEQ tablet Take 20 mEq by mouth daily.   Yes [provider]  aspirin EC 81 MG EC tablet Take 1 tablet (81 mg total) by mouth daily. Patient not taking: Reported on 06/10/2016 02/25/16   Cristal Ford, DO  feeding supplement, GLUCERNA SHAKE, (GLUCERNA SHAKE) LIQD Take 237 mLs by mouth 3 (three) times daily between meals. 06/14/16   Molt, Bethany, DO    Family History Family History  Problem Relation Age of Onset  . Arthritis Mother        rheumatoid  . Heart attack Father   . Heart disease Father   . Heart disease Brother   . Colon cancer Brother 23  . Heart disease  Brother   . Stomach cancer Neg Hx     Social History Social History  Tobacco Use  . Smoking status: Former Smoker    Packs/day: 3.00    Years: 50.00    Pack years: 150.00    Types: Cigarettes    Last attempt to quit: 09/25/2009    Years since quitting: 8.4  . Smokeless tobacco: Never Used  Substance Use Topics  . Alcohol use: Yes    Alcohol/week: 6.0 standard drinks    Types: 6 Standard drinks or equivalent per week    Comment: [place]bon 2-3 a night  . Drug use: No     Allergies   Ticlopidine hcl; Lorazepam; Other; and Prednisone   Review of Systems Review of Systems  Constitutional: Negative for appetite change, chills and fever.  Respiratory: Positive for cough and shortness of breath. Negative for wheezing.   Cardiovascular: Negative for chest pain, palpitations and leg swelling.  Gastrointestinal: Negative for abdominal pain, diarrhea, nausea and vomiting.  Genitourinary: Negative for dysuria.  Musculoskeletal: Negative for back pain.  Skin: Negative for rash.  Allergic/Immunologic: Negative for immunocompromised state.  Neurological: Negative for dizziness, speech difficulty, weakness, numbness and headaches.  Psychiatric/Behavioral: Positive for dysphoric mood and hallucinations. Negative for confusion, self-injury and suicidal ideas.    Physical Exam Updated Vital Signs BP (!) 85/53 (BP Location: Right Arm)   Pulse 69   Temp (!) 97.3 F (36.3 C) (Oral)   Resp 10   Ht 5\' 3"  (1.6 m)   Wt 63.5 kg   SpO2 96%   BMI 24.80 kg/m   Physical Exam Vitals signs and nursing note reviewed.  Constitutional:      Appearance: He is well-developed.     Comments: Nasal cannula in place.  Chronically ill-appearing elderly gentleman.  No acute distress.  HENT:     Head: Normocephalic.  Eyes:     Conjunctiva/sclera: Conjunctivae normal.  Neck:     Musculoskeletal: Neck supple.     Vascular: No JVD.  Cardiovascular:     Rate and Rhythm: Normal rate and regular  rhythm.     Heart sounds: No murmur. No friction rub. No gallop.      Comments: Barrel chested appearance Pulmonary:     Effort: Pulmonary effort is normal.     Comments: Rhonchorous breath sounds more prominently noted on the bilateral lower lobes.  Lung sounds are mildly more diminished in the bilateral upper lobes.  Equal chest expansion with inspiration. Abdominal:     General: There is no distension.     Palpations: Abdomen is soft.  Skin:    General: Skin is warm and dry.  Neurological:     Mental Status: He is alert.     Comments: He is oriented to person and place.  GCS 15. he thinks the month is January and the year is 2001.  He is able to correctly state the president.  Cranial nerves II through XII are grossly intact.  Good strength against resistance of the bilateral upper and lower extremities.  Finger-to-nose is intact bilaterally.  Psychiatric:        Behavior: Behavior normal.      ED Treatments / Results  Labs (all labs ordered are listed, but only abnormal results are displayed) Labs Reviewed  BASIC METABOLIC PANEL - Abnormal; Notable for the following components:      Result Value   Glucose, Bld 119 (*)    Calcium 8.5 (*)    All other components within normal limits  CBC - Abnormal; Notable for the following components:   WBC 13.6 (*)  Platelets 137 (*)    All other components within normal limits  CBC WITH DIFFERENTIAL/PLATELET - Abnormal; Notable for the following components:   WBC 11.1 (*)    Platelets 147 (*)    Neutro Abs 8.3 (*)    Monocytes Absolute 1.3 (*)    All other components within normal limits  BASIC METABOLIC PANEL - Abnormal; Notable for the following components:   Chloride 97 (*)    Glucose, Bld 101 (*)    Calcium 8.7 (*)    All other components within normal limits  I-STAT VENOUS BLOOD GAS, ED - Abnormal; Notable for the following components:   pO2, Ven 22.0 (*)    Bicarbonate 30.0 (*)    Acid-Base Excess 3.0 (*)    All other  components within normal limits  RESPIRATORY PANEL BY PCR  URINE CULTURE  AMMONIA  PROCALCITONIN  INFLUENZA PANEL BY PCR (TYPE A & B)  URINALYSIS, ROUTINE W REFLEX MICROSCOPIC  I-STAT TROPONIN, ED    EKG EKG Interpretation  Date/Time:  Thursday March 15 2018 12:48:04 EST Ventricular Rate:  58 PR Interval:    QRS Duration: 108 QT Interval:  420 QTC Calculation: 395 R Axis:   25 Text Interpretation:  Atrial fibrillation Anterior infarct, old Repol abnrm suggests ischemia, lateral leads Since last EKG, no significant change although baseline wander in precordial leads of prior EKG limits direct comparison Confirmed by Duffy Bruce [phone]) on 03/15/2018 1:06:15 PM   Radiology Dg Chest 2 View  Result Date: 03/15/2018 CLINICAL DATA:  Cough and shortness of breath EXAM: CHEST - 2 VIEW COMPARISON:  Chest radiograph Aug 03, 2017 and chest CT Aug 04, 2017 FINDINGS: There are areas of patchy fibrosis bilaterally and areas of scarring. Underlying emphysematous changes are noted, better seen on prior CT. Lungs are hyperexpanded with prominence of the retrosternal clear space. There is no frank edema or consolidation. Heart is upper normal in size with pulmonary vascularity normal. No adenopathy. Patient is status post coronary artery bypass grafting. There is aortic atherosclerosis. There is a small hiatal hernia. There is collapse of the T7 vertebral body, stable. There is also collapsed of the L1 vertebral body. There are surgical clips in the left thyroid region. IMPRESSION: 1. Underlying emphysematous change with areas of fibrosis and scarring. No frank edema or consolidation currently. 2. stable cardiac silhouette. There is aortic atherosclerosis. Patient is status post coronary artery bypass grafting. 3.  Focal hiatal hernia. 4.  Stable collapse of the T7 and L1 vertebral bodies. Aortic Atherosclerosis (ICD10-I70.0) and Emphysema (ICD10-J43.9). Electronically Signed   By: Lowella Grip  III M.D.   On: 03/15/2018 14:28   Ct Head Wo Contrast  Result Date: 03/15/2018 CLINICAL DATA:  Altered mental status EXAM: CT HEAD WITHOUT CONTRAST TECHNIQUE: Contiguous axial images were obtained from the base of the skull through the vertex without intravenous contrast. COMPARISON:  CT 08/03/2017 FINDINGS: Brain: Mild to moderate atrophy unchanged. Negative for hydrocephalus. Chronic white matter changes stable. Negative for acute infarct, hemorrhage, or mass. No midline shift. Vascular: Atherosclerotic calcification. Negative for hyperdense vessel Skull: Negative Sinuses/Orbits: Paranasal sinuses clear. No orbital lesion. Cataract surgery on the right. Other: None IMPRESSION: No acute intracranial abnormality. Atrophy and chronic microvascular ischemia. Electronically Signed   By: Franchot Gallo M.D.   On: 03/15/2018 15:41    Procedures Procedures (including critical care time)  Medications Ordered in ED Medications  albuterol (PROVENTIL) (2.5 MG/3ML) 0.083% nebulizer solution 5 mg (5 mg Nebulization Not Given 03/15/18 1457)  albuterol (PROVENTIL) (2.5 MG/3ML) 0.083% nebulizer solution 5 mg (5 mg Nebulization Not Given 03/15/18 1659)  ipratropium-albuterol (DUONEB) 0.5-2.5 (3) MG/3ML nebulizer solution 3 mL (3 mLs Nebulization Given 03/16/18 0232)  sodium chloride HYPERTONIC 3 % nebulizer solution 4 mL (4 mLs Nebulization Given 03/15/18 2045)  dextromethorphan-guaiFENesin ([place] DM) 30-600 MG per 12 hr tablet 2 tablet (2 tablets Oral Given 03/15/18 2015)  azithromycin (ZITHROMAX) tablet 500 mg (500 mg Oral Given 03/15/18 2015)    Followed by  azithromycin (ZITHROMAX) tablet 250 mg (has no administration in time range)  hydrALAZINE (APRESOLINE) injection 10 mg (has no administration in time range)  acetaminophen (TYLENOL) tablet 650 mg (has no administration in time range)  ondansetron (ZOFRAN) injection 4 mg (has no administration in time range)  arformoterol (BROVANA) nebulizer solution  15 mcg (15 mcg Nebulization Given 03/15/18 2044)  atorvastatin (LIPITOR) tablet 40 mg (40 mg Oral Given 03/15/18 2015)  diltiazem (CARDIZEM CD) 24 hr capsule 180 mg (has no administration in time range)  furosemide (LASIX) tablet 20 mg (has no administration in time range)  pantoprazole (PROTONIX) EC tablet 40 mg (40 mg Oral Given 03/15/18 2015)  Melatonin TABS 6 mg (6 mg Oral Given 03/15/18 2019)  enoxaparin (LOVENOX) injection 40 mg (40 mg Subcutaneous Given 03/15/18 2015)  calcium-vitamin D (OSCAL WITH D) 500-200 MG-UNIT per tablet 1 tablet (has no administration in time range)  ipratropium-albuterol (DUONEB) 0.5-2.5 (3) MG/3ML nebulizer solution 3 mL (3 mLs Nebulization Given 03/15/18 1456)  ipratropium-albuterol (DUONEB) 0.5-2.5 (3) MG/3ML nebulizer solution 3 mL (3 mLs Nebulization Given 03/15/18 1703)     Initial Impression / Assessment and Plan / ED Course  I have reviewed the triage vital signs and the nursing notes.  Pertinent labs & imaging results that were available during my care of the patient were reviewed by me and considered in my medical decision making (see chart for details).  Clinical Course as of Mar 16 624  Thu Mar 15, 2018  2101 Negative  CT Head Wo Contrast [BH]  2101 negative  DG Chest 2 View [BH]    Clinical Course User Index [BH] Henderly, Britni A, PA-C    77 year old male with a history of diabetes mellitus, COPD, chronic respiratory failure on 2L home O2 presenting with worsening shortness of breath, productive cough for the last few days.  No constitutional symptoms.  Family is also concerned about more erratic behavior over the last few weeks.  He has been cutting his home oxygen tubing several times in the last month.  He endorses visual hallucinations for the last 6 months.  Symptoms have worsened since his acute symptoms began over the last few days.  The patient was discussed and independently evaluated by Dr. Ellender Hose, attending physician.  SaO2 in  the low 90s on the patient's home oxygen.  He is normotensive and afebrile.  Albuterol nebulizer given in the ED.  He is declining prednisone or Solu-Medrol as he states that this worsens his hallucinations and "puts him over the edge".   EKG with no significant changes from previous.  Mild leukocytosis of 13.6.  Chest x-ray with emphysematous changes with areas of fibrosis and scarring.  Given patient's worsening confusion, ammonia and i-STAT VBG have been ordered.  CT head is also pending to rule out underlying causes of the patient's hallucinations and worsening confusions.  I suspect the patient's symptoms are secondary to a COPD exacerbation and he will require admission to the hospital for further work-up and evaluation.  Given the  patient's concern for limited ADLs with his current home setting, he will likely require social work consult if he is admitted.  Patient care transferred to PA Henderly at the end of my shift. Patient presentation, ED course, and plan of care discussed with review of all pertinent labs and imaging. Please see his/her note for further details regarding further ED course and disposition.   Final Clinical Impressions(s) / ED Diagnoses   Final diagnoses:  COPD exacerbation [facility])    ED Discharge Orders    None       Zuly Belkin A, PA-C 03/15/18 1717    Tykeem Lanzer A, PA-C 03/16/18 4619    Duffy Bruce, MD 03/17/18 [phone]

## 2018-03-15 NOTE — ED Notes (Signed)
1st attempt to call report to RN on 2w.  RN currently in pt room, return # left to call this RN back directly.

## 2018-03-16 DIAGNOSIS — Z9981 Dependence on supplemental oxygen: Secondary | ICD-10-CM | POA: Diagnosis not present

## 2018-03-16 DIAGNOSIS — Z888 Allergy status to other drugs, medicaments and biological substances status: Secondary | ICD-10-CM | POA: Diagnosis not present

## 2018-03-16 DIAGNOSIS — I2510 Atherosclerotic heart disease of native coronary artery without angina pectoris: Secondary | ICD-10-CM | POA: Diagnosis present

## 2018-03-16 DIAGNOSIS — R197 Diarrhea, unspecified: Secondary | ICD-10-CM | POA: Diagnosis present

## 2018-03-16 DIAGNOSIS — M6281 Muscle weakness (generalized): Secondary | ICD-10-CM | POA: Diagnosis not present

## 2018-03-16 DIAGNOSIS — J440 Chronic obstructive pulmonary disease with acute lower respiratory infection: Secondary | ICD-10-CM | POA: Diagnosis present

## 2018-03-16 DIAGNOSIS — I480 Paroxysmal atrial fibrillation: Secondary | ICD-10-CM | POA: Diagnosis present

## 2018-03-16 DIAGNOSIS — E785 Hyperlipidemia, unspecified: Secondary | ICD-10-CM | POA: Diagnosis present

## 2018-03-16 DIAGNOSIS — Z79899 Other long term (current) drug therapy: Secondary | ICD-10-CM | POA: Diagnosis not present

## 2018-03-16 DIAGNOSIS — Z7982 Long term (current) use of aspirin: Secondary | ICD-10-CM | POA: Diagnosis not present

## 2018-03-16 DIAGNOSIS — J209 Acute bronchitis, unspecified: Secondary | ICD-10-CM | POA: Diagnosis present

## 2018-03-16 DIAGNOSIS — I5032 Chronic diastolic (congestive) heart failure: Secondary | ICD-10-CM | POA: Diagnosis present

## 2018-03-16 DIAGNOSIS — K219 Gastro-esophageal reflux disease without esophagitis: Secondary | ICD-10-CM | POA: Diagnosis present

## 2018-03-16 DIAGNOSIS — Z66 Do not resuscitate: Secondary | ICD-10-CM | POA: Diagnosis present

## 2018-03-16 DIAGNOSIS — Z955 Presence of coronary angioplasty implant and graft: Secondary | ICD-10-CM | POA: Diagnosis not present

## 2018-03-16 DIAGNOSIS — R278 Other lack of coordination: Secondary | ICD-10-CM | POA: Diagnosis not present

## 2018-03-16 DIAGNOSIS — G9341 Metabolic encephalopathy: Secondary | ICD-10-CM | POA: Diagnosis present

## 2018-03-16 DIAGNOSIS — R269 Unspecified abnormalities of gait and mobility: Secondary | ICD-10-CM | POA: Diagnosis present

## 2018-03-16 DIAGNOSIS — J9610 Chronic respiratory failure, unspecified whether with hypoxia or hypercapnia: Secondary | ICD-10-CM | POA: Diagnosis present

## 2018-03-16 DIAGNOSIS — E1151 Type 2 diabetes mellitus with diabetic peripheral angiopathy without gangrene: Secondary | ICD-10-CM | POA: Diagnosis present

## 2018-03-16 DIAGNOSIS — Z951 Presence of aortocoronary bypass graft: Secondary | ICD-10-CM | POA: Diagnosis not present

## 2018-03-16 DIAGNOSIS — Z87891 Personal history of nicotine dependence: Secondary | ICD-10-CM | POA: Diagnosis not present

## 2018-03-16 DIAGNOSIS — Z9181 History of falling: Secondary | ICD-10-CM | POA: Diagnosis not present

## 2018-03-16 DIAGNOSIS — I110 Hypertensive heart disease with heart failure: Secondary | ICD-10-CM | POA: Diagnosis present

## 2018-03-16 DIAGNOSIS — R5381 Other malaise: Secondary | ICD-10-CM | POA: Diagnosis present

## 2018-03-16 DIAGNOSIS — R41841 Cognitive communication deficit: Secondary | ICD-10-CM | POA: Diagnosis not present

## 2018-03-16 DIAGNOSIS — J441 Chronic obstructive pulmonary disease with (acute) exacerbation: Secondary | ICD-10-CM | POA: Diagnosis present

## 2018-03-16 DIAGNOSIS — Z885 Allergy status to narcotic agent status: Secondary | ICD-10-CM | POA: Diagnosis not present

## 2018-03-16 LAB — CBC WITH DIFFERENTIAL/PLATELET
Abs Immature Granulocytes: 0.05 10*3/uL (ref 0.00–0.07)
Basophils Absolute: 0 10*3/uL (ref 0.0–0.1)
Basophils Relative: 0 %
Eosinophils Absolute: 0.1 10*3/uL (ref 0.0–0.5)
Eosinophils Relative: 1 %
HCT: 46.6 % (ref 39.0–52.0)
Hemoglobin: 15.1 g/dL (ref 13.0–17.0)
Immature Granulocytes: 1 %
Lymphocytes Relative: 12 %
Lymphs Abs: 1.3 10*3/uL (ref 0.7–4.0)
MCH: 31.9 pg (ref 26.0–34.0)
MCHC: 32.4 g/dL (ref 30.0–36.0)
MCV: 98.3 fL (ref 80.0–100.0)
Monocytes Absolute: 1.3 10*3/uL — ABNORMAL HIGH (ref 0.1–1.0)
Monocytes Relative: 11 %
Neutro Abs: 8.3 10*3/uL — ABNORMAL HIGH (ref 1.7–7.7)
Neutrophils Relative %: 75 %
Platelets: 147 10*3/uL — ABNORMAL LOW (ref 150–400)
RBC: 4.74 MIL/uL (ref 4.22–5.81)
RDW: 13.3 % (ref 11.5–15.5)
WBC: 11.1 10*3/uL — AB (ref 4.0–10.5)
nRBC: 0 % (ref 0.0–0.2)

## 2018-03-16 LAB — BASIC METABOLIC PANEL
Anion gap: 15 (ref 5–15)
BUN: 16 mg/dL (ref 8–23)
CALCIUM: 8.7 mg/dL — AB (ref 8.9–10.3)
CO2: 25 mmol/L (ref 22–32)
CREATININE: 1.04 mg/dL (ref 0.61–1.24)
Chloride: 97 mmol/L — ABNORMAL LOW (ref 98–111)
GFR calc Af Amer: >60 mL/min (ref >60)
GFR calc non Af Amer: >60 mL/min (ref >60)
Glucose, Bld: 101 mg/dL — ABNORMAL HIGH (ref 70–99)
Potassium: 3.9 mmol/L (ref 3.5–5.1)
Sodium: 137 mmol/L (ref 135–145)

## 2018-03-16 MED ORDER — LOPERAMIDE HCL 2 MG PO CAPS
2.0000 mg | ORAL_CAPSULE | Freq: Once | ORAL | Status: AC
  Administered 2018-03-16: 2 mg via ORAL
  Filled 2018-03-16: qty 1

## 2018-03-16 NOTE — Plan of Care (Signed)
  Problem: Activity: Goal: Risk for activity intolerance will decrease Outcome: Progressing   

## 2018-03-16 NOTE — Evaluation (Signed)
Physical Therapy Evaluation Patient Details Name: [name] MRN: [id number] DOB: [birth year] Today's Date: 03/16/2018   History of Present Illness  Pt. with PMH of COPD, chronic hypoxic respiratory failure on 2 LPM, CAD S/P CABG, HTN, HLD, chronic diastolic CHF, PAF; admitted on 03/15/2018, presented with complaint of shortness of breath and cough, was found to have COPD exacerbation   Clinical Impression  Pt admitted with above diagnosis. Pt currently with functional limitations due to the deficits listed below (see PT Problem List). PTA pt living at home with son, mod I with household ambulation. Son with poor back and pending surgery, unable to physically assist patient at this point per patient and son. Today patient ambulating with mild unsteadiness and hands on guarding, believe he will benefit from short term SNF brefore returning home to decrease risk of falls and caregiver burden.  Pt will benefit from skilled PT to increase their independence and safety with mobility to allow discharge to the venue listed below.     SpO2 WNL on 3L with light activity.     Follow Up Recommendations SNF    Equipment Recommendations  (TBD next venue)    Recommendations for Other Services       Precautions / Restrictions Precautions Precautions: Fall Restrictions Weight Bearing Restrictions: No      Mobility  Bed Mobility Overal bed mobility: Modified Independent                Transfers Overall transfer level: Needs assistance Equipment used: Rolling walker (2 wheeled) Transfers: Sit to/from Stand Sit to Stand: Min guard            Ambulation/Gait Ambulation/Gait assistance: Min guard Gait Distance (Feet): 50 Feet Assistive device: Rolling walker (2 wheeled) Gait Pattern/deviations: Step-to pattern Gait velocity: decreased   General Gait Details: pt with mild unsteadiness, ambulating with RW and contact gaurd for safety. WNL SpO2 on 3L  Stairs             Wheelchair Mobility    Modified Rankin (Stroke Patients Only)       Balance                                             Pertinent Vitals/Pain Pain Assessment: No/denies pain    Home Living Family/patient expects to be discharged to:: Skilled nursing facility                      Prior Function Level of Independence: Independent with assistive device(s)               Hand Dominance        Extremity/Trunk Assessment   Upper Extremity Assessment Upper Extremity Assessment: Overall WFL for tasks assessed    Lower Extremity Assessment Lower Extremity Assessment: Generalized weakness       Communication      Cognition Arousal/Alertness: Awake/alert Behavior During Therapy: WFL for tasks assessed/performed Overall Cognitive Status: Within Functional Limits for tasks assessed                                        General Comments      Exercises     Assessment/Plan    PT Assessment Patient needs continued PT services  PT Problem List Decreased strength  PT Treatment Interventions DME instruction;Gait training;Stair training;Functional mobility training;Therapeutic exercise;Therapeutic activities    PT Goals (Current goals can be found in the Care Plan section)  Acute Rehab PT Goals Patient Stated Goal: go to rehab PT Goal Formulation: With patient Time For Goal Achievement: 03/30/18 Potential to Achieve Goals: Good    Frequency Min 3X/week   Barriers to discharge        Co-evaluation               AM-PAC PT "6 Clicks" Mobility  Outcome Measure Help needed turning from your back to your side while in a flat bed without using bedrails?: None Help needed moving from lying on your back to sitting on the side of a flat bed without using bedrails?: None Help needed moving to and from a bed to a chair (including a wheelchair)?: A Little Help needed standing up from a chair using your arms  (e.g., wheelchair or bedside chair)?: A Little Help needed to walk in hospital room?: A Little Help needed climbing 3-5 steps with a railing? : A Lot 6 Click Score: 19    End of Session Equipment Utilized During Treatment: Gait belt;Oxygen Activity Tolerance: Patient tolerated treatment well Patient left: in bed;with bed alarm set;with call bell/phone within reach Nurse Communication: Mobility status PT Visit Diagnosis: Unsteadiness on feet (R26.81)    Time: 7681-1572 PT Time Calculation (min) (ACUTE ONLY): 25 min   Charges:   PT Evaluation $PT Eval Low Complexity: 1 Low PT Treatments $Gait Training: 8-22 mins       Reinaldo Berber, PT, DPT Acute Rehabilitation Services Pager: [phone] Office: [place] 03/16/2018, 3:56 PM

## 2018-03-16 NOTE — Progress Notes (Signed)
[facility] Progress Note  Patient: [name] YWV:[id number]   PCP: Reubin Milan, MD DOB: 12/18/1940   DOA: 03/15/2018   DOS: 03/16/2018   Date of Service: the patient was seen and examined on 03/16/2018  Brief hospital course: Pt. with PMH of COPD, chronic hypoxic respiratory failure on 2 LPM, CAD S/P CABG, HTN, HLD, chronic diastolic CHF, PAF; admitted on 03/15/2018, presented with complaint of shortness of breath and cough, was found to have COPD exacerbation. Currently further plan is continue current care.  Subjective: Still having shortness of breath.  Still requiring 2 L of oxygen more than baseline.  No nausea no vomiting.  Breathing shows some improvement.  Oral intake adequate.  No chest pain no abdominal pain.  Reports 3 loose watery BM this morning.  Mentions he has on and off diarrhea even at home.  Assessment and Plan: 1.  COPD exacerbation likely secondary to acute bronchitis. Respiratory viral panel as well as influenza panel negative. Plan present on mildly elevated. Continue Z-Pak. Continue DuoNeb's. Continue Mucinex, discontinue hypersaline nebulization as well as chest PT. Patient refuses to take steroids. Monitor for now.  2.  Diarrhea. 3 loose BM this morning. No blood in the stool. No abdominal pain, no nausea no vomiting.  No high risk signs. We will provide 1 dose of Imodium and monitor.  Coronary artery disease status post CABG Resume cardiac medications  Chronic diastolic CHF Euvolemic  Hypertension Blood pressure is stable Resume antihypertensive medications  Ambulatory dysfunction/physical debility PT to assess Fall precautions  Diet: Cardiac diet DVT Prophylaxis: subcutaneous Heparin  Advance goals of care discussion: DNR/DNI  Family Communication: no family was present at bedside, at the time of interview.   Disposition:  Discharge to home tomorrow likely with home health.  Consultants: none Procedures:  none  Scheduled Meds: . albuterol  5 mg Nebulization Once  . albuterol  5 mg Nebulization Once  . arformoterol  15 mcg Nebulization BID  . atorvastatin  40 mg Oral Daily  . azithromycin  250 mg Oral Daily  . calcium-vitamin D  1 tablet Oral BID WC  . dextromethorphan-guaiFENesin  2 tablet Oral BID  . diltiazem  180 mg Oral Daily  . enoxaparin (LOVENOX) injection  40 mg Subcutaneous Q24H  . ipratropium-albuterol  3 mL Nebulization Q6H  . Melatonin  6 mg Oral QHS   Continuous Infusions: PRN Meds: acetaminophen, hydrALAZINE, ondansetron (ZOFRAN) IV Antibiotics: Anti-infectives (From admission, onward)   Start     Dose/Rate Route Frequency Ordered Stop   03/16/18 1000  azithromycin (ZITHROMAX) tablet 250 mg     250 mg Oral Daily 03/15/18 1753 03/20/18 0959   03/15/18 1800  azithromycin (ZITHROMAX) tablet 500 mg     500 mg Oral Daily 03/15/18 1753 03/15/18 2015       Objective: Physical Exam: Vitals:   03/16/18 0232 03/16/18 0242 03/16/18 0747 03/16/18 0801  BP:    106/79  Pulse:    97  Resp:    16  Temp:    (!) 97.5 F (36.4 C)  TempSrc:    Oral  SpO2: 91% 96% 96% 98%  Weight:      Height:        Intake/Output Summary (Last 24 hours) at 03/16/2018 1404 Last data filed at 03/16/2018 0845 Gross per 24 hour  Intake 900 ml  Output -  Net 900 ml   Filed Weights   03/15/18 1250 03/15/18 2000  Weight: 64 kg 63.5 kg   General: Alert,  Awake and Oriented to Time, Place and Person. Appear in mild distress, affect appropriate Eyes: PERRL, Conjunctiva normal ENT: Oral Mucosa clear moist. Neck: no JVD, no Abnormal Mass Or lumps Cardiovascular: S1 and S2 Present, no Murmur, Peripheral Pulses Present Respiratory: normal respiratory effort, Bilateral Air entry equal and Decreased, no use of accessory muscle no  Crackles, bilateral  wheezes Abdomen: Bowel Sound present, Soft and no tenderness, n hernia Skin: ono redness, no Rash, no induration Extremities: no Pedal edema, no  calf tenderness Neurologic: Grossly no focal neuro deficit. Bilaterally Equal motor strength  Data Reviewed: CBC: Recent Labs  Lab 03/15/18 1351 03/16/18 0231  WBC 13.6* 11.1*  NEUTROABS  --  8.3*  HGB 15.0 15.1  HCT 46.9 46.6  MCV 99.8 98.3  PLT 137* 951*   Basic Metabolic Panel: Recent Labs  Lab 03/15/18 1351 03/16/18 0231  NA 138 137  K 4.2 3.9  CL 99 97*  CO2 27 25  GLUCOSE 119* 101*  BUN 17 16  CREATININE 0.77 1.04  CALCIUM 8.5* 8.7*    Liver Function Tests: No results for input(s): AST, ALT, ALKPHOS, BILITOT, PROT, ALBUMIN in the last 168 hours. No results for input(s): LIPASE, AMYLASE in the last 168 hours. Recent Labs  Lab 03/15/18 1524  AMMONIA 10   Coagulation Profile: No results for input(s): INR, PROTIME in the last 168 hours. Cardiac Enzymes: No results for input(s): CKTOTAL, CKMB, CKMBINDEX, TROPONINI in the last 168 hours. BNP (last 3 results) No results for input(s): PROBNP in the last 8760 hours. CBG: No results for input(s): GLUCAP in the last 168 hours. Studies: Dg Chest 2 View  Result Date: 03/15/2018 CLINICAL DATA:  Cough and shortness of breath EXAM: CHEST - 2 VIEW COMPARISON:  Chest radiograph Aug 03, 2017 and chest CT Aug 04, 2017 FINDINGS: There are areas of patchy fibrosis bilaterally and areas of scarring. Underlying emphysematous changes are noted, better seen on prior CT. Lungs are hyperexpanded with prominence of the retrosternal clear space. There is no frank edema or consolidation. Heart is upper normal in size with pulmonary vascularity normal. No adenopathy. Patient is status post coronary artery bypass grafting. There is aortic atherosclerosis. There is a small hiatal hernia. There is collapse of the T7 vertebral body, stable. There is also collapsed of the L1 vertebral body. There are surgical clips in the left thyroid region. IMPRESSION: 1. Underlying emphysematous change with areas of fibrosis and scarring. No frank edema or  consolidation currently. 2. stable cardiac silhouette. There is aortic atherosclerosis. Patient is status post coronary artery bypass grafting. 3.  Focal hiatal hernia. 4.  Stable collapse of the T7 and L1 vertebral bodies. Aortic Atherosclerosis (ICD10-I70.0) and Emphysema (ICD10-J43.9). Electronically Signed   By: Lowella Grip III M.D.   On: 03/15/2018 14:28   Ct Head Wo Contrast  Result Date: 03/15/2018 CLINICAL DATA:  Altered mental status EXAM: CT HEAD WITHOUT CONTRAST TECHNIQUE: Contiguous axial images were obtained from the base of the skull through the vertex without intravenous contrast. COMPARISON:  CT 08/03/2017 FINDINGS: Brain: Mild to moderate atrophy unchanged. Negative for hydrocephalus. Chronic white matter changes stable. Negative for acute infarct, hemorrhage, or mass. No midline shift. Vascular: Atherosclerotic calcification. Negative for hyperdense vessel Skull: Negative Sinuses/Orbits: Paranasal sinuses clear. No orbital lesion. Cataract surgery on the right. Other: None IMPRESSION: No acute intracranial abnormality. Atrophy and chronic microvascular ischemia. Electronically Signed   By: Franchot Gallo M.D.   On: 03/15/2018 15:41     Time spent: 35  minutes  Author: Berle Mull, MD [facility] Pager: [phone] 03/16/2018 2:04 PM  Between 7PM-7AM, please contact night-coverage at [email or web address], password [facility]

## 2018-03-17 LAB — CBC
HCT: 42.4 % (ref 39.0–52.0)
Hemoglobin: 13.8 g/dL (ref 13.0–17.0)
MCH: 31.7 pg (ref 26.0–34.0)
MCHC: 32.5 g/dL (ref 30.0–36.0)
MCV: 97.2 fL (ref 80.0–100.0)
Platelets: 141 10*3/uL — ABNORMAL LOW (ref 150–400)
RBC: 4.36 MIL/uL (ref 4.22–5.81)
RDW: 13 % (ref 11.5–15.5)
WBC: 8.9 10*3/uL (ref 4.0–10.5)
nRBC: 0 % (ref 0.0–0.2)

## 2018-03-17 LAB — URINE CULTURE: Culture: NO GROWTH

## 2018-03-17 LAB — BASIC METABOLIC PANEL
Anion gap: 11 (ref 5–15)
BUN: 14 mg/dL (ref 8–23)
CO2: 26 mmol/L (ref 22–32)
Calcium: 8.5 mg/dL — ABNORMAL LOW (ref 8.9–10.3)
Chloride: 100 mmol/L (ref 98–111)
Creatinine, Ser: 1.01 mg/dL (ref 0.61–1.24)
GFR calc Af Amer: >60 mL/min (ref >60)
Glucose, Bld: 138 mg/dL — ABNORMAL HIGH (ref 70–99)
Potassium: 3.5 mmol/L (ref 3.5–5.1)
SODIUM: 137 mmol/L (ref 135–145)

## 2018-03-17 MED ORDER — CEFDINIR 300 MG PO CAPS
300.0000 mg | ORAL_CAPSULE | Freq: Two times a day (BID) | ORAL | Status: DC
  Administered 2018-03-17 – 2018-03-19 (×5): 300 mg via ORAL
  Filled 2018-03-17 (×5): qty 1

## 2018-03-17 MED ORDER — AZITHROMYCIN 250 MG PO TABS
250.0000 mg | ORAL_TABLET | Freq: Once | ORAL | Status: AC
  Administered 2018-03-17: 250 mg via ORAL
  Filled 2018-03-17: qty 1

## 2018-03-17 MED ORDER — AZITHROMYCIN 250 MG PO TABS
500.0000 mg | ORAL_TABLET | Freq: Every day | ORAL | Status: DC
  Administered 2018-03-18 – 2018-03-19 (×2): 500 mg via ORAL
  Filled 2018-03-17 (×2): qty 2

## 2018-03-17 MED ORDER — CEFDINIR 300 MG PO CAPS: 300.0000 mg | ORAL_CAPSULE | Freq: Two times a day (BID) | ORAL | Status: DC

## 2018-03-17 MED ORDER — BENZONATATE 100 MG PO CAPS
100.0000 mg | ORAL_CAPSULE | Freq: Three times a day (TID) | ORAL | Status: DC
  Administered 2018-03-17 – 2018-03-19 (×5): 100 mg via ORAL
  Filled 2018-03-17 (×5): qty 1

## 2018-03-17 NOTE — NC FL2 (Signed)
[place] LEVEL OF CARE SCREENING TOOL     IDENTIFICATION  Patient Name: [name] Birthdate: Jun 29, 1940 Sex: male Admission Date (Current Location): 03/15/2018  [facility] [place] Number:  Herbalist and Address:  [facility], [place] [address], [place], [place] [address]      Provider Number: [id number]  Attending Physician Name and Address:  Lavina Hamman, MD  Relative Name and Phone Number:  [place], [place], [phone]    Current Level of Care: SNF Recommended Level of Care: [place] Prior Approval Number:    Date Approved/Denied: 03/17/18 PASRR Number: [id number]  Discharge Plan: SNF    Current Diagnoses: Patient Active Problem List   Diagnosis Date Noted  . Pulmonary nodules   . COPD with acute exacerbation ([place]) 06/10/2016  . Chest pain 02/23/2016  . Lung nodule, solitary   . HCAP (healthcare-associated pneumonia)   . Iron deficiency anemia 02/15/2015  . CAD (coronary artery disease) 09/18/2014  . Compression fracture of L1 lumbar vertebra ([facility])   . Syncope 08/23/2014  . Low back pain 08/23/2014  . Hypertension 08/23/2014  . COPD (chronic obstructive pulmonary disease) ([place]) 08/23/2014  . Chronic atrial fibrillation 08/23/2014  . Syncope and collapse   . Olecranon bursitis 07/09/2014  . Diabetes mellitus type II, controlled ([place]) 07/01/2014  . Chronic anticoagulation 05/29/2014  . Acute respiratory failure with hypoxia ([place]) 05/14/2014  . Diastolic CHF ([place]) 05/69/7948  . COPD exacerbation ([place])   . CAP (community acquired pneumonia) 04/03/2014  . Allergic rhinitis 02/06/2014  . GERD (gastroesophageal reflux disease) 02/06/2014  . Do not resuscitate 02/06/2014  . Erectile dysfunction 06/18/2010  . Atrial fibrillation ([place]) 11/06/2009  . Peripheral vascular disease ([place]) 09/02/2009  . Carotid artery stenosis 04/16/2009  . ANEMIA, B12 DEFICIENCY 12/02/2008  . COPD GOLD III  05/21/2007  . TOBACCO USE 01/19/2007  . Hyperlipidemia 11/21/2006  . Essential hypertension 11/21/2006  . Hx of CABG 11/21/2006  . Osteoporosis 11/21/2006    Orientation RESPIRATION BLADDER Height & Weight     Self, Time, Situation, Place  O2(Sp02 93, nasal cannula, flow rate 3) Continent Weight: 139 lb 15.9 oz (63.5 kg) Height:  5\' 3"  (160 cm)  BEHAVIORAL SYMPTOMS/MOOD NEUROLOGICAL BOWEL NUTRITION STATUS        Diet(Cardiac/heart healthy)  AMBULATORY STATUS COMMUNICATION OF NEEDS Skin   Independent   Normal, Other (Comment)(Dry skin)                       Personal Care Assistance Level of Assistance  Bathing, Feeding, Dressing Bathing Assistance: Limited assistance Feeding assistance: Independent Dressing Assistance: Limited assistance     Functional Limitations Info  Sight, Hearing, Speech Sight Info: Adequate Hearing Info: Adequate Speech Info: Adequate    SPECIAL CARE FACTORS FREQUENCY  PT (By licensed PT), OT (By licensed OT)     PT Frequency: 5x/wk OT Frequency: 5x/wk            Contractures      Additional Factors Info  Allergies, Code Status Code Status Info: DNR Allergies Info:  Ticlopidine Hcl, Lorazepam, Other, Prednisone           Current Medications (03/17/2018):  This is the current hospital active medication list Current Facility-Administered Medications  Medication Dose Route Frequency Provider Last Rate Last Dose  . acetaminophen (TYLENOL) tablet 650 mg  650 mg Oral Q6H PRN Irene Pap N, DO      . arformoterol (  BROVANA) nebulizer solution 15 mcg  15 mcg Nebulization BID Irene Pap N, DO   15 mcg at 03/17/18 0800  . atorvastatin (LIPITOR) tablet 40 mg  40 mg Oral Daily Irene Pap N, DO   40 mg at 03/17/18 4270  . azithromycin (ZITHROMAX) tablet 500 mg  500 mg Oral Daily Lavina Hamman, MD      . calcium-vitamin D (OSCAL WITH D) 500-200 MG-UNIT per tablet 1 tablet  1 tablet Oral BID WC Irene Pap N, DO   1 tablet at 03/17/18  6237  . cefdinir (OMNICEF) capsule 300 mg  300 mg Oral Q12H Lavina Hamman, MD      . dextromethorphan-guaiFENesin [facility] DM) 30-600 MG per 12 hr tablet 2 tablet  2 tablet Oral BID Irene Pap N, DO   2 tablet at 03/17/18 6283  . diltiazem (CARDIZEM CD) 24 hr capsule 180 mg  180 mg Oral Daily Irene Pap N, DO   180 mg at 03/17/18 1517  . enoxaparin (LOVENOX) injection 40 mg  40 mg Subcutaneous Q24H Irene Pap N, DO   40 mg at 03/16/18 2111  . hydrALAZINE (APRESOLINE) injection 10 mg  10 mg Intravenous Q6H PRN Irene Pap N, DO      . ipratropium-albuterol (DUONEB) 0.5-2.5 (3) MG/3ML nebulizer solution 3 mL  3 mL Nebulization Q6H Hall, Carole N, DO   3 mL at 03/17/18 0757  . Melatonin TABS 6 mg  6 mg Oral QHS Irene Pap N, DO   6 mg at 03/16/18 2112  . ondansetron (ZOFRAN) injection 4 mg  4 mg Intravenous Q6H PRN Kayleen Memos, DO         Discharge Medications: Please see discharge summary for a list of discharge medications.  Relevant Imaging Results:  Relevant Lab Results:   Additional Information SSN: [id number]  Philippa Chester Lauris Keepers, LCSWA

## 2018-03-17 NOTE — Progress Notes (Signed)
03/17/2018 patient room air saturation at rest was 86%. [facility] RN.

## 2018-03-17 NOTE — Progress Notes (Signed)
[facility] Progress Note  Patient: [name] TKZ:[id number]   PCP: Reubin Milan, MD DOB: [birth year]   DOA: 03/15/2018   DOS: 03/17/2018   Date of Service: the patient was seen and examined on 03/17/2018  Brief hospital course: Pt. with PMH of COPD, chronic hypoxic respiratory failure on 2 LPM, CAD S/P CABG, HTN, HLD, chronic diastolic CHF, PAF; admitted on 03/15/2018, presented with complaint of shortness of breath and cough, was found to have COPD exacerbation. Currently further plan is continue current care.  Subjective: Breathing is better.  No nausea no vomiting no fever no chills.  Assessment and Plan: 1.  COPD exacerbation likely secondary to acute bronchitis. Respiratory viral panel as well as influenza panel negative. Plan present on mildly elevated. Continue Z-Pak.  Add Cefdnir Continue DuoNeb's. Continue Mucinex, discontinue hypersaline nebulization as well as chest PT. Patient refuses to take steroids. Monitor for now.  2.  Diarrhea.  Currently resolved 3 loose BM yesterday morning. No blood in the stool. No abdominal pain, no nausea no vomiting.  No high risk signs.  Coronary artery disease status post CABG Resume cardiac medications  Chronic diastolic CHF Euvolemic  Hypertension Blood pressure is stable Resume antihypertensive medications  Ambulatory dysfunction/physical debility PT commence SNF Fall precautions  Diet: Cardiac diet DVT Prophylaxis: subcutaneous Heparin  Advance goals of care discussion: DNR/DNI  Family Communication: no family was present at bedside, at the time of interview.   Disposition:  Discharge to SNF  Consultants: none Procedures: none  Scheduled Meds: . arformoterol  15 mcg Nebulization BID  . atorvastatin  40 mg Oral Daily  . azithromycin  500 mg Oral Daily  . calcium-vitamin D  1 tablet Oral BID WC  . cefdinir  300 mg Oral Q12H  . dextromethorphan-guaiFENesin  2 tablet Oral BID  . diltiazem  180 mg  Oral Daily  . enoxaparin (LOVENOX) injection  40 mg Subcutaneous Q24H  . ipratropium-albuterol  3 mL Nebulization Q6H  . Melatonin  6 mg Oral QHS   Continuous Infusions: PRN Meds: acetaminophen, hydrALAZINE, ondansetron (ZOFRAN) IV Antibiotics: Anti-infectives (From admission, onward)   Start     Dose/Rate Route Frequency Ordered Stop   03/17/18 1430  azithromycin (ZITHROMAX) tablet 250 mg     250 mg Oral  Once 03/17/18 1416 03/17/18 1508   03/17/18 1045  cefdinir (OMNICEF) capsule 300 mg  Status:  Discontinued     300 mg Oral Every 12 hours 03/17/18 1036 03/17/18 1036   03/17/18 1045  azithromycin (ZITHROMAX) tablet 500 mg     500 mg Oral Daily 03/17/18 1036 03/21/18 0959   03/17/18 1045  cefdinir (OMNICEF) capsule 300 mg     300 mg Oral Every 12 hours 03/17/18 1036 03/22/18 0959   03/16/18 1000  azithromycin (ZITHROMAX) tablet 250 mg  Status:  Discontinued     250 mg Oral Daily 03/15/18 1753 03/17/18 1035   03/15/18 1800  azithromycin (ZITHROMAX) tablet 500 mg     500 mg Oral Daily 03/15/18 1753 03/15/18 2015       Objective: Physical Exam: Vitals:   03/16/18 2357 03/17/18 0201 03/17/18 0822 03/17/18 1445  BP: 122/74  127/78   Pulse: 74  96   Resp: 20  18   Temp: 98.4 F (36.9 C)  97.9 F (36.6 C)   TempSrc: Oral  Oral   SpO2: 98% 93% 97% 97%  Weight:      Height:        Intake/Output Summary (Last 24 hours)  at 03/17/2018 1713 Last data filed at 03/17/2018 0900 Gross per 24 hour  Intake 360 ml  Output -  Net 360 ml   Filed Weights   03/15/18 1250 03/15/18 2000  Weight: 64 kg 63.5 kg   General: Alert, Awake and Oriented to Time, Place and Person. Appear in mild distress, affect appropriate Eyes: PERRL, Conjunctiva normal ENT: Oral Mucosa clear moist. Neck: no JVD, no Abnormal Mass Or lumps Cardiovascular: S1 and S2 Present, no Murmur, Peripheral Pulses Present Respiratory: normal respiratory effort, Bilateral Air entry equal and Decreased, no use of accessory  muscle no  Crackles, bilateral  wheezes Abdomen: Bowel Sound present, Soft and no tenderness, n hernia Skin: ono redness, no Rash, no induration Extremities: no Pedal edema, no calf tenderness Neurologic: Grossly no focal neuro deficit. Bilaterally Equal motor strength  Data Reviewed: CBC: Recent Labs  Lab 03/15/18 1351 03/16/18 0231 03/17/18 0257  WBC 13.6* 11.1* 8.9  NEUTROABS  --  8.3*  --   HGB 15.0 15.1 13.8  HCT 46.9 46.6 42.4  MCV 99.8 98.3 97.2  PLT 137* 147* 383*   Basic Metabolic Panel: Recent Labs  Lab 03/15/18 1351 03/16/18 0231 03/17/18 0257  NA 138 137 137  K 4.2 3.9 3.5  CL 99 97* 100  CO2 27 25 26   GLUCOSE 119* 101* 138*  BUN 17 16 14   CREATININE 0.77 1.04 1.01  CALCIUM 8.5* 8.7* 8.5*    Liver Function Tests: No results for input(s): AST, ALT, ALKPHOS, BILITOT, PROT, ALBUMIN in the last 168 hours. No results for input(s): LIPASE, AMYLASE in the last 168 hours. Recent Labs  Lab 03/15/18 1524  AMMONIA 10   Coagulation Profile: No results for input(s): INR, PROTIME in the last 168 hours. Cardiac Enzymes: No results for input(s): CKTOTAL, CKMB, CKMBINDEX, TROPONINI in the last 168 hours. BNP (last 3 results) No results for input(s): PROBNP in the last 8760 hours. CBG: No results for input(s): GLUCAP in the last 168 hours. Studies: No results found.   Time spent: 35 minutes  Author: Berle Mull, MD [facility] Pager: [phone] 03/17/2018 5:13 PM  Between 7PM-7AM, please contact night-coverage at [email or web address], password [facility]

## 2018-03-18 LAB — CBC
HCT: 43.2 % (ref 39.0–52.0)
Hemoglobin: 14 g/dL (ref 13.0–17.0)
MCH: 31.7 pg (ref 26.0–34.0)
MCHC: 32.4 g/dL (ref 30.0–36.0)
MCV: 98 fL (ref 80.0–100.0)
PLATELETS: 161 10*3/uL (ref 150–400)
RBC: 4.41 MIL/uL (ref 4.22–5.81)
RDW: 13 % (ref 11.5–15.5)
WBC: 7.1 10*3/uL (ref 4.0–10.5)
nRBC: 0 % (ref 0.0–0.2)

## 2018-03-18 LAB — BASIC METABOLIC PANEL
Anion gap: 10 (ref 5–15)
BUN: 16 mg/dL (ref 8–23)
CALCIUM: 8.9 mg/dL (ref 8.9–10.3)
CO2: 28 mmol/L (ref 22–32)
Chloride: 102 mmol/L (ref 98–111)
Creatinine, Ser: 0.95 mg/dL (ref 0.61–1.24)
GFR calc Af Amer: >60 mL/min (ref >60)
GFR calc non Af Amer: >60 mL/min (ref >60)
Glucose, Bld: 138 mg/dL — ABNORMAL HIGH (ref 70–99)
Potassium: 3.9 mmol/L (ref 3.5–5.1)
Sodium: 140 mmol/L (ref 135–145)

## 2018-03-18 NOTE — Progress Notes (Signed)
[facility] Progress Note  Patient: [name] BDZ:[id number]   PCP: Reubin Milan, MD DOB: [birth year]   DOA: 03/15/2018   DOS: 03/18/2018   Date of Service: the patient was seen and examined on 03/18/2018  Brief hospital course: Pt. with PMH of COPD, chronic hypoxic respiratory failure on 2 LPM, CAD S/P CABG, HTN, HLD, chronic diastolic CHF, PAF; admitted on 03/15/2018, presented with complaint of shortness of breath and cough, was found to have COPD exacerbation. Currently further plan is continue current care.  Subjective: Cough is better.  Breathing is about the same.  No nausea no vomiting normal further episodes of diarrhea.  No chest pain.  Assessment and Plan: 1.  COPD exacerbation likely secondary to acute bronchitis. Respiratory viral panel as well as influenza panel negative. Plan present on mildly elevated. Continue Z-Pak.  Add Cefdnir Continue DuoNeb's. Continue Mucinex, discontinue hypersaline nebulization as well as chest PT. Patient refuses to take steroids. Monitor for now.  2.  Diarrhea.  Currently resolved 3 loose BM yesterday morning. No blood in the stool. No abdominal pain, no nausea no vomiting.  No high risk signs.  Coronary artery disease status post CABG Resume cardiac medications  Chronic diastolic CHF Euvolemic  Hypertension Blood pressure is stable Resume antihypertensive medications  Ambulatory dysfunction/physical debility PT commence SNF Fall precautions  Acute encephalopathy. Last night patient did have some delirium. Currently appears to be back to baseline. Etiology unclear but could potentially  Be sundowning. Monitor for now.  Diet: Cardiac diet DVT Prophylaxis: subcutaneous Heparin  Advance goals of care discussion: DNR/DNI  Family Communication: no family was present at bedside, at the time of interview.   Disposition:  Discharge to SNF when bed is available.  Consultants: none Procedures: none  Scheduled  Meds: . arformoterol  15 mcg Nebulization BID  . atorvastatin  40 mg Oral Daily  . azithromycin  500 mg Oral Daily  . benzonatate  100 mg Oral TID  . calcium-vitamin D  1 tablet Oral BID WC  . cefdinir  300 mg Oral Q12H  . dextromethorphan-guaiFENesin  2 tablet Oral BID  . diltiazem  180 mg Oral Daily  . enoxaparin (LOVENOX) injection  40 mg Subcutaneous Q24H  . ipratropium-albuterol  3 mL Nebulization Q6H  . Melatonin  6 mg Oral QHS   Continuous Infusions: PRN Meds: acetaminophen, hydrALAZINE, ondansetron (ZOFRAN) IV Antibiotics: Anti-infectives (From admission, onward)   Start     Dose/Rate Route Frequency Ordered Stop   03/17/18 1430  azithromycin (ZITHROMAX) tablet 250 mg     250 mg Oral  Once 03/17/18 1416 03/17/18 1508   03/17/18 1045  cefdinir (OMNICEF) capsule 300 mg  Status:  Discontinued     300 mg Oral Every 12 hours 03/17/18 1036 03/17/18 1036   03/17/18 1045  azithromycin (ZITHROMAX) tablet 500 mg     500 mg Oral Daily 03/17/18 1036 03/21/18 0959   03/17/18 1045  cefdinir (OMNICEF) capsule 300 mg     300 mg Oral Every 12 hours 03/17/18 1036 03/22/18 0959   03/16/18 1000  azithromycin (ZITHROMAX) tablet 250 mg  Status:  Discontinued     250 mg Oral Daily 03/15/18 1753 03/17/18 1035   03/15/18 1800  azithromycin (ZITHROMAX) tablet 500 mg     500 mg Oral Daily 03/15/18 1753 03/15/18 2015       Objective: Physical Exam: Vitals:   03/18/18 0013 03/18/18 0346 03/18/18 0837 03/18/18 1414  BP: 128/71  121/83   Pulse: 74  98  Resp:   20   Temp:   97.7 F (36.5 C)   TempSrc:      SpO2:  96% 96% 96%  Weight:      Height:       No intake or output data in the 24 hours ending 03/18/18 1621 Filed Weights   03/15/18 1250 03/15/18 2000  Weight: 64 kg 63.5 kg   General: Alert, Awake and Oriented to Time, Place and Person. Appear in mild distress, affect appropriate Eyes: PERRL, Conjunctiva normal ENT: Oral Mucosa clear moist. Neck: no JVD, no Abnormal Mass Or  lumps Cardiovascular: S1 and S2 Present, no Murmur, Peripheral Pulses Present Respiratory: normal respiratory effort, Bilateral Air entry equal and Decreased, no use of accessory muscle no  Crackles, bilateral  wheezes Abdomen: Bowel Sound present, Soft and no tenderness, n hernia Skin: ono redness, no Rash, no induration Extremities: no Pedal edema, no calf tenderness Neurologic: Grossly no focal neuro deficit. Bilaterally Equal motor strength  Data Reviewed: CBC: Recent Labs  Lab 03/15/18 1351 03/16/18 0231 03/17/18 0257 03/18/18 0242  WBC 13.6* 11.1* 8.9 7.1  NEUTROABS  --  8.3*  --   --   HGB 15.0 15.1 13.8 14.0  HCT 46.9 46.6 42.4 43.2  MCV 99.8 98.3 97.2 98.0  PLT 137* 147* 141* 194   Basic Metabolic Panel: Recent Labs  Lab 03/15/18 1351 03/16/18 0231 03/17/18 0257 03/18/18 0242  NA 138 137 137 140  K 4.2 3.9 3.5 3.9  CL 99 97* 100 102  CO2 27 25 26 28   GLUCOSE 119* 101* 138* 138*  BUN 17 16 14 16   CREATININE 0.77 1.04 1.01 0.95  CALCIUM 8.5* 8.7* 8.5* 8.9    Liver Function Tests: No results for input(s): AST, ALT, ALKPHOS, BILITOT, PROT, ALBUMIN in the last 168 hours. No results for input(s): LIPASE, AMYLASE in the last 168 hours. Recent Labs  Lab 03/15/18 1524  AMMONIA 10   Coagulation Profile: No results for input(s): INR, PROTIME in the last 168 hours. Cardiac Enzymes: No results for input(s): CKTOTAL, CKMB, CKMBINDEX, TROPONINI in the last 168 hours. BNP (last 3 results) No results for input(s): PROBNP in the last 8760 hours. CBG: No results for input(s): GLUCAP in the last 168 hours. Studies: No results found.   Time spent: 35 minutes  Author: Berle Mull, MD [facility] Pager: [phone] 03/18/2018 4:21 PM  Between 7PM-7AM, please contact night-coverage at [email or web address], password [facility]

## 2018-03-18 NOTE — Clinical Social Work Note (Signed)
Clinical Social Work Assessment  Patient Details  Name: [name] MRN: [id number] Date of Birth: 10/28/1940  Date of referral:  03/18/18               Reason for consult:  Discharge Planning                Permission sought to share information with:  Case Manager, Facility Sport and exercise psychologist, Family Supports Permission granted to share information::  Yes, Verbal Permission Granted  Name::     [name]  Agency::  SNFs  Relationship::  son  Contact Information:  [phone]  Housing/Transportation Living arrangements for the past 2 months:  Single Family Home Source of Information:  Adult Children Patient Interpreter Needed:  None Criminal Activity/Legal Involvement Pertinent to Current Situation/Hospitalization:  No - Comment as needed Significant Relationships:  Adult Children Lives with:  Self Do you feel safe going back to the place where you live?  No Need for family participation in patient care:  Yes (Comment)  Care giving concerns:  CSW received referral for possible SNF placement at time of discharge. Spoke with patient's son [name] regarding possibility of SNF placement, as patient is currently disoriented X3. Patient's family is currently unable to care for him at their home given patient's current needs and fall risk.  Patient's son [name] expressed understanding of PT recommendation and are agreeable to SNF placement at time of discharge. CSW to continue to follow and assist with discharge planning needs.     Social Worker assessment / plan:  Spoke with patient's son [name]   concerning possibility of rehab at [facility] before returning home. [place] reported he would like patient to attend [facility], and to ask all staff to please contact him with any questions.    Employment status:  Retired Forensic scientist:  Medicare PT Recommendations:  [place] / Referral to community resources:  [place]  Patient/Family's  Response to care:  Patient's son recognizes need for rehab for patient before returning home and are agreeable to a SNF in [place]. They report preference for  [facility]. CSW explained insurance authorization process. Patient's family reported that they want patient to get stronger to be able to come back home.    Patient/Family's Understanding of and Emotional Response to Diagnosis, Current Treatment, and Prognosis:  Patient/family is realistic regarding therapy needs and expressed being hopeful for SNF placement at [facility]. Patient's family expressed understanding of CSW role and discharge process as well as medical condition. No questions/concerns about plan or treatment.    Emotional Assessment Appearance:  Appears stated age Attitude/Demeanor/Rapport:  Unable to Assess Affect (typically observed):  Unable to Assess Orientation:  Oriented to Self Alcohol / Substance use:  Not Applicable Psych involvement (Current and /or in the community):  No (Comment)  Discharge Needs  Concerns to be addressed:  Discharge Planning Concerns Readmission within the last 30 days:  No Current discharge risk:  Dependent with Mobility Barriers to Discharge:  Continued Medical Work up   [employer], LCSW 03/18/2018, 10:31 AM

## 2018-03-18 NOTE — Progress Notes (Signed)
Patient was sitting on edge of bed and asking RN if she knew the scores to the [employer].  We looked up the score and then he began asking when he had to catch his plane.  He was mentioning his daughter, Larene Beach, and when asked where he was, he replied [place].  Patient was reoriented to [facility].  He knew the month was December and initially said the year was 1919, but then recognized that was wrong.  He reoriented easily and was going to try to go back to sleep.

## 2018-03-19 DIAGNOSIS — Z9181 History of falling: Secondary | ICD-10-CM | POA: Diagnosis not present

## 2018-03-19 DIAGNOSIS — I5030 Unspecified diastolic (congestive) heart failure: Secondary | ICD-10-CM | POA: Diagnosis not present

## 2018-03-19 DIAGNOSIS — J441 Chronic obstructive pulmonary disease with (acute) exacerbation: Secondary | ICD-10-CM | POA: Diagnosis not present

## 2018-03-19 DIAGNOSIS — M6281 Muscle weakness (generalized): Secondary | ICD-10-CM | POA: Diagnosis not present

## 2018-03-19 DIAGNOSIS — I480 Paroxysmal atrial fibrillation: Secondary | ICD-10-CM | POA: Diagnosis not present

## 2018-03-19 DIAGNOSIS — J209 Acute bronchitis, unspecified: Secondary | ICD-10-CM | POA: Diagnosis not present

## 2018-03-19 DIAGNOSIS — J440 Chronic obstructive pulmonary disease with acute lower respiratory infection: Secondary | ICD-10-CM | POA: Diagnosis not present

## 2018-03-19 DIAGNOSIS — J449 Chronic obstructive pulmonary disease, unspecified: Secondary | ICD-10-CM | POA: Diagnosis not present

## 2018-03-19 DIAGNOSIS — I110 Hypertensive heart disease with heart failure: Secondary | ICD-10-CM | POA: Diagnosis not present

## 2018-03-19 DIAGNOSIS — R41841 Cognitive communication deficit: Secondary | ICD-10-CM | POA: Diagnosis not present

## 2018-03-19 DIAGNOSIS — R278 Other lack of coordination: Secondary | ICD-10-CM | POA: Diagnosis not present

## 2018-03-19 DIAGNOSIS — I10 Essential (primary) hypertension: Secondary | ICD-10-CM | POA: Diagnosis not present

## 2018-03-19 DIAGNOSIS — Z9981 Dependence on supplemental oxygen: Secondary | ICD-10-CM | POA: Diagnosis not present

## 2018-03-19 DIAGNOSIS — I5032 Chronic diastolic (congestive) heart failure: Secondary | ICD-10-CM | POA: Diagnosis not present

## 2018-03-19 DIAGNOSIS — I2510 Atherosclerotic heart disease of native coronary artery without angina pectoris: Secondary | ICD-10-CM | POA: Diagnosis not present

## 2018-03-19 DIAGNOSIS — E119 Type 2 diabetes mellitus without complications: Secondary | ICD-10-CM | POA: Diagnosis not present

## 2018-03-19 LAB — BASIC METABOLIC PANEL
Anion gap: 10 (ref 5–15)
BUN: 15 mg/dL (ref 8–23)
CO2: 28 mmol/L (ref 22–32)
CREATININE: 0.9 mg/dL (ref 0.61–1.24)
Calcium: 8.7 mg/dL — ABNORMAL LOW (ref 8.9–10.3)
Chloride: 101 mmol/L (ref 98–111)
GFR calc Af Amer: >60 mL/min (ref >60)
Glucose, Bld: 139 mg/dL — ABNORMAL HIGH (ref 70–99)
Potassium: 3.9 mmol/L (ref 3.5–5.1)
Sodium: 139 mmol/L (ref 135–145)

## 2018-03-19 LAB — CBC
HCT: 42.8 % (ref 39.0–52.0)
Hemoglobin: 13.8 g/dL (ref 13.0–17.0)
MCH: 31.8 pg (ref 26.0–34.0)
MCHC: 32.2 g/dL (ref 30.0–36.0)
MCV: 98.6 fL (ref 80.0–100.0)
Platelets: 163 10*3/uL (ref 150–400)
RBC: 4.34 MIL/uL (ref 4.22–5.81)
RDW: 13.1 % (ref 11.5–15.5)
WBC: 6.8 10*3/uL (ref 4.0–10.5)
nRBC: 0 % (ref 0.0–0.2)

## 2018-03-19 MED ORDER — CEFDINIR 300 MG PO CAPS: 300.0000 mg | ORAL_CAPSULE | Freq: Two times a day (BID) | ORAL | 0 refills | Status: AC

## 2018-03-19 MED ORDER — IPRATROPIUM-ALBUTEROL 0.5-2.5 (3) MG/3ML IN SOLN: 3.0000 mL | Freq: Four times a day (QID) | RESPIRATORY_TRACT | 0 refills | Status: DC

## 2018-03-19 MED ORDER — DM-GUAIFENESIN ER 30-600 MG PO TB12: 2.0000 | ORAL_TABLET | Freq: Two times a day (BID) | ORAL | 0 refills | Status: AC

## 2018-03-19 MED ORDER — BENZONATATE 100 MG PO CAPS: 100.0000 mg | ORAL_CAPSULE | Freq: Three times a day (TID) | ORAL | 0 refills | Status: DC

## 2018-03-19 NOTE — Clinical Social Work Placement (Signed)
   CLINICAL SOCIAL WORK PLACEMENT  NOTE  Date:  03/19/2018  Patient Details  Name: [name] MRN: [id number] Date of Birth: 1941-01-20  Clinical Social Work is seeking post-discharge placement for this patient at the [place] level of care (*CSW will initial, date and re-position this form in  chart as items are completed):      Patient/family provided with [facility] Work Department's list of facilities offering this level of care within the geographic area requested by the patient (or if unable, by the patient's family).      Patient/family informed of their freedom to choose among providers that offer the needed level of care, that participate in Medicare, Medicaid or managed care program needed by the patient, have an available bed and are willing to accept the patient.      Patient/family informed of [facility]'s ownership interest in [facility] and [facility], as well as of the fact that they are under no obligation to receive care at these facilities.  PASRR submitted to EDS on       PASRR number received on       Existing PASRR number confirmed on       FL2 transmitted to all facilities in geographic area requested by pt/family on       FL2 transmitted to all facilities within larger geographic area on       Patient informed that his/her managed care company has contracts with or will negotiate with certain facilities, including the following:        Yes   Patient/family informed of bed offers received.  Patient chooses bed at [facility]     Physician recommends and patient chooses bed at      Patient to be transferred to [facility] on 03/19/18.  Patient to be transferred to facility by [facility]     Patient family notified on 03/19/18 of transfer.  Name of family member notified:  Mishael Haran.     PHYSICIAN Please prepare prescriptions, Please sign DNR     Additional Comment:     _______________________________________________ Candie Chroman, LCSW 03/19/2018, 12:17 PM

## 2018-03-19 NOTE — Progress Notes (Signed)
IV removed. Report given to [facility] prior to pt discharge. Fritz Pickerel, RN

## 2018-03-19 NOTE — Progress Notes (Signed)
Attempted to call report. Line rang with busy signal.   Fritz Pickerel, RN

## 2018-03-19 NOTE — Discharge Summary (Signed)
[facility] Discharge Summary   Patient: [name] ZYS:[id number]   PCP: Reubin Milan, MD DOB: 05-Sep-1940   Date of admission: 03/15/2018   Date of discharge:  03/19/2018    Discharge Diagnoses:  Principal diagnosis COPD exacerbation secondary to acute bronchitis.  Active Problems:   COPD exacerbation ([place]) CAD S/P CABG Chronic diastolic CHF Hypertension  Admitted From: Home Disposition: SNF  Recommendations for Outpatient Follow-up:  1. Please follow-up with PCP in 1 week   Contact information for follow-up providers    Mulles, Junious Dresser, MD. Schedule an appointment as soon as possible for a visit in 1 week(s).   Specialty:  Internal Medicine Contact information: [place] [address] [phone]            Contact information for after-discharge care    Destination    [facility] .   Service:  Skilled Nursing Contact information: [address] [place] [phone]                 Diet recommendation: Cardiac diet  Activity: The patient is advised to gradually reintroduce usual activities.  Discharge Condition: good  Code Status: DNR/DNI  History of present illness: As per the H and P dictated on admission, "[name] is a 77 y.o. male with medical history significant for COPD on 2 L of oxygen by nasal cannula at baseline continuously, coronary artery disease status post CABG, hypertension, hyperlipidemia, chronic diastolic CHF, paroxysmal A. fib not on anticoagulation due to high fall risk who presented to [facility] ED with complaints of gradually worsening shortness of breath and productive cough with yellow mucus.  Patient denies fevers, chills, or sick contacts.  Denies chest pain or palpitations.  No recent lengthy trips.  Was admitted and treated for pneumonia in May 2019 and has had recurrent pneumonias in the past.  Symptoms associated with  intermittent confusion.  ED Course: On presentation to the ED, patient appears uncomfortable due to dyspnea at rest.  Vital signs remarkable for tachypnea with RR of 24.  Lab studies remarkable for leukocytosis with WBC 13.6.  Chest x-ray done on presentation independently reviewed revealed no obvious lobular infiltrates."  Hospital Course:  Summary of his active problems in the hospital is as following. 1.  COPD exacerbation likely secondary to acute bronchitis. Respiratory viral panel as well as influenza panel negative. Plan present on mildly elevated. Continue Z-Pak.  Add Cefdnir Continue DuoNeb's. Continue Mucinex, discontinue hypersaline nebulization as well as chest PT. Patient refuses to take steroids. Monitor for now.  2.  Diarrhea.  Currently resolved 3 loose BM yesterday morning. No blood in the stool. No abdominal pain, no nausea no vomiting.  No high risk signs.  Coronary artery disease status post CABG Resume cardiac medications  Chronic diastolic CHF Euvolemic  Hypertension Blood pressure is stable Resume antihypertensive medications  Ambulatory dysfunction/physical debility PT commence SNF Fall precautions  Acute encephalopathy. Patient did have some delirium here in the hospital. Currently appears back to baseline and no acute etiology. Is oriented and alert x4.  All other chronic medical condition were stable during the hospitalization.  Patient was seen by physical therapy, who recommended home health which was arranged by Education officer, museum and case Freight forwarder. On the day of the discharge the patient's vitals were stable , and no other acute medical condition were reported by patient. the patient was felt safe to be discharge at [facility] with home health.  Consultants: noen Procedures: none  DISCHARGE MEDICATION: Allergies as of 03/19/2018      Reactions   Ticlopidine Hcl Swelling   Lorazepam Other (See Comments)   Altered mental status from 2 doses  of IV Ativan on 05/10/14 HALLUCINATIONS AND DELIRIOUS   Other Other (See Comments)   Narcotics: Hallucinations and paranoia   Prednisone Other (See Comments)   NOT TRUE ALLERGY - CONFUSION      Medication List    STOP taking these medications   arformoterol 15 MCG/2ML Nebu Commonly known as:  BROVANA   aspirin 81 MG EC tablet   clonazePAM 0.5 MG tablet Commonly known as:  KLONOPIN     TAKE these medications   acetaminophen 500 MG tablet Commonly known as:  TYLENOL Take 500 mg by mouth every 6 (six) hours as needed for mild pain or moderate pain.   atorvastatin 40 MG tablet Commonly known as:  LIPITOR Take 40 mg by mouth daily.   azithromycin 250 MG tablet Commonly known as:  ZITHROMAX Take 500 mg by mouth every Monday, Wednesday, and Friday.   benzonatate 100 MG capsule Commonly known as:  TESSALON Take 1 capsule (100 mg total) by mouth 3 (three) times daily.   CALTRATE 600+D PLUS 600-400 MG-UNIT per tablet Chew 1 tablet by mouth 2 (two) times daily.   cefdinir 300 MG capsule Commonly known as:  OMNICEF Take 1 capsule (300 mg total) by mouth 2 (two) times daily for 2 days.   dextromethorphan-guaiFENesin 30-600 MG 12hr tablet Commonly known as:  MUCINEX DM Take 2 tablets by mouth 2 (two) times daily for 5 days.   diltiazem 180 MG 24 hr capsule Commonly known as:  CARDIZEM CD Take 180 mg by mouth daily.   feeding supplement (GLUCERNA SHAKE) Liqd Take 237 mLs by mouth 3 (three) times daily between meals.   furosemide 20 MG tablet Commonly known as:  LASIX Take 20 mg by mouth daily.   guaifenesin 400 MG Tabs tablet Commonly known as:  HUMIBID E Take 400 mg by mouth every 4 (four) hours.   ipratropium-albuterol 0.5-2.5 (3) MG/3ML Soln Commonly known as:  DUONEB Take 3 mLs by nebulization 4 (four) times daily.   LACTOBACILLUS PO Take 2 capsules by mouth daily.   loperamide 2 MG capsule Commonly known as:  IMODIUM Take 2 mg by mouth as needed for  diarrhea or loose stools.   Melatonin 3 MG Tabs Take 6 mg by mouth at bedtime.   OXYGEN Inhale 2-4 L into the lungs See admin instructions. Inhale 2 L continuous during the day and 4 L at night for sleep   pantoprazole 40 MG tablet Commonly known as:  PROTONIX TAKE 1 TABLET BY MOUTH TWICE A DAY   potassium chloride SA 20 MEQ tablet Commonly known as:  K-DUR,KLOR-CON Take 20 mEq by mouth daily.   PROAIR HFA 108 (90 Base) MCG/ACT inhaler Generic drug:  albuterol Inhale 2 puffs into the lungs every 4 (four) hours as needed for wheezing or shortness of breath.   ZYRTEC ALLERGY 10 MG tablet Generic drug:  cetirizine Take 1 tablet (10 mg total) by mouth every morning.      Allergies  Allergen Reactions  . Ticlopidine Hcl Swelling  . Lorazepam Other (See Comments)    Altered mental status from 2 doses of IV Ativan on 05/10/14 HALLUCINATIONS AND DELIRIOUS  . Other Other (See Comments)    Narcotics: Hallucinations and paranoia  . Prednisone Other (See Comments)    NOT TRUE  ALLERGY - CONFUSION   Discharge Instructions    Diet - low sodium heart healthy   Complete by:  As directed    Discharge instructions   Complete by:  As directed    It is important that you read following instructions as well as go over your medication list with RN to help you understand your care after this hospitalization.  Discharge Instructions: Please follow-up with PCP in one week  Please request your primary care physician to go over all Hospital Tests and Procedure/Radiological results at the follow up,  Please get all Hospital records sent to your PCP by signing hospital release before you go home.   Do not take more than prescribed Pain, Sleep and Anxiety Medications. You were cared for by a hospitalist during your hospital stay. If you have any questions about your discharge medications or the care you received while you were in the hospital after you are discharged, you can call the unit you  were admitted to and ask to speak with the hospitalist on call if the hospitalist that took care of you is not available.  Once you are discharged, your primary care physician will handle any further medical issues. Please note that NO REFILLS for any discharge medications will be authorized once you are discharged, as it is imperative that you return to your primary care physician (or establish a relationship with a primary care physician if you do not have one) for your aftercare needs so that they can reassess your need for medications and monitor your lab values. You Must read complete instructions/literature along with all the possible adverse reactions/side effects for all the Medicines you take and that have been prescribed to you. Take any new Medicines after you have completely understood and accept all the possible adverse reactions/side effects. Wear Seat belts while driving. If you have smoked or chewed Tobacco in the last 2 yrs please stop smoking and/or stop any Recreational drug use.   Increase activity slowly   Complete by:  As directed      Discharge Exam: Filed Weights   03/15/18 1250 03/15/18 2000  Weight: 64 kg 63.5 kg   Vitals:   03/19/18 0820 03/19/18 0835  BP:  132/81  Pulse:  73  Resp:    Temp:  98 F (36.7 C)  SpO2: 94% 98%   General: Appear in no distress, no Rash; Oral Mucosa moist. Cardiovascular: S1 and S2 Present, no Murmur, no JVD Respiratory: Bilateral Air entry present and Clear to Auscultation, no Crackles, no wheezes Abdomen: Bowel Sound present, Soft and no tenderness Extremities: no Pedal edema, no calf tenderness Neurology: Grossly no focal neuro deficit.  The results of significant diagnostics from this hospitalization (including imaging, microbiology, ancillary and laboratory) are listed below for reference.    Significant Diagnostic Studies: Dg Chest 2 View  Result Date: 03/15/2018 CLINICAL DATA:  Cough and shortness of breath EXAM: CHEST  - 2 VIEW COMPARISON:  Chest radiograph Aug 03, 2017 and chest CT Aug 04, 2017 FINDINGS: There are areas of patchy fibrosis bilaterally and areas of scarring. Underlying emphysematous changes are noted, better seen on prior CT. Lungs are hyperexpanded with prominence of the retrosternal clear space. There is no frank edema or consolidation. Heart is upper normal in size with pulmonary vascularity normal. No adenopathy. Patient is status post coronary artery bypass grafting. There is aortic atherosclerosis. There is a small hiatal hernia. There is collapse of the T7 vertebral body, stable. There is also collapsed of  the L1 vertebral body. There are surgical clips in the left thyroid region. IMPRESSION: 1. Underlying emphysematous change with areas of fibrosis and scarring. No frank edema or consolidation currently. 2. stable cardiac silhouette. There is aortic atherosclerosis. Patient is status post coronary artery bypass grafting. 3.  Focal hiatal hernia. 4.  Stable collapse of the T7 and L1 vertebral bodies. Aortic Atherosclerosis (ICD10-I70.0) and Emphysema (ICD10-J43.9). Electronically Signed   By: Lowella Grip III M.D.   On: 03/15/2018 14:28   Ct Head Wo Contrast  Result Date: 03/15/2018 CLINICAL DATA:  Altered mental status EXAM: CT HEAD WITHOUT CONTRAST TECHNIQUE: Contiguous axial images were obtained from the base of the skull through the vertex without intravenous contrast. COMPARISON:  CT 08/03/2017 FINDINGS: Brain: Mild to moderate atrophy unchanged. Negative for hydrocephalus. Chronic white matter changes stable. Negative for acute infarct, hemorrhage, or mass. No midline shift. Vascular: Atherosclerotic calcification. Negative for hyperdense vessel Skull: Negative Sinuses/Orbits: Paranasal sinuses clear. No orbital lesion. Cataract surgery on the right. Other: None IMPRESSION: No acute intracranial abnormality. Atrophy and chronic microvascular ischemia. Electronically Signed   By: Franchot Gallo  M.D.   On: 03/15/2018 15:41    Microbiology: Recent Results (from the past 240 hour(s))  Respiratory Panel by PCR     Status: None   Collection Time: 03/15/18  6:18 PM  Result Value Ref Range Status   Adenovirus NOT DETECTED NOT DETECTED Final   Coronavirus 229E NOT DETECTED NOT DETECTED Final   Coronavirus HKU1 NOT DETECTED NOT DETECTED Final   Coronavirus NL63 NOT DETECTED NOT DETECTED Final   Coronavirus OC43 NOT DETECTED NOT DETECTED Final   Metapneumovirus NOT DETECTED NOT DETECTED Final   Rhinovirus / Enterovirus NOT DETECTED NOT DETECTED Final   Influenza A NOT DETECTED NOT DETECTED Final   Influenza B NOT DETECTED NOT DETECTED Final   Parainfluenza Virus 1 NOT DETECTED NOT DETECTED Final   Parainfluenza Virus 2 NOT DETECTED NOT DETECTED Final   Parainfluenza Virus 3 NOT DETECTED NOT DETECTED Final   Parainfluenza Virus 4 NOT DETECTED NOT DETECTED Final   Respiratory Syncytial Virus NOT DETECTED NOT DETECTED Final   Bordetella pertussis NOT DETECTED NOT DETECTED Final   Chlamydophila pneumoniae NOT DETECTED NOT DETECTED Final   Mycoplasma pneumoniae NOT DETECTED NOT DETECTED Final    Comment: Performed at [facility], [place] [address] [place], [place] [address]  Culture, Urine     Status: None   Collection Time: 03/15/18  6:54 PM  Result Value Ref Range Status   Specimen Description URINE, CLEAN CATCH  Final   Special Requests NONE  Final   Culture   Final    NO GROWTH Performed at [facility], [place] [address], [place], [place] [address]    Report Status 03/17/2018 FINAL  Final     Labs: CBC: Recent Labs  Lab 03/15/18 1351 03/16/18 0231 03/17/18 0257 03/18/18 0242 03/19/18 0227  WBC 13.6* 11.1* 8.9 7.1 6.8  NEUTROABS  --  8.3*  --   --   --   HGB 15.0 15.1 13.8 14.0 13.8  HCT 46.9 46.6 42.4 43.2 42.8  MCV 99.8 98.3 97.2 98.0 98.6  PLT 137* 147* 141* 161 789   Basic Metabolic Panel: Recent Labs  Lab 03/15/18 1351 03/16/18 0231 03/17/18 0257  03/18/18 0242 03/19/18 0227  NA 138 137 137 140 139  K 4.2 3.9 3.5 3.9 3.9  CL 99 97* 100 102 101  CO2 27 25 26 28  28  GLUCOSE 119* 101* 138* 138* 139*  BUN 17 16 14 16 15   CREATININE 0.77 1.04 1.01 0.95 0.90  CALCIUM 8.5* 8.7* 8.5* 8.9 8.7*   Liver Function Tests: No results for input(s): AST, ALT, ALKPHOS, BILITOT, PROT, ALBUMIN in the last 168 hours. No results for input(s): LIPASE, AMYLASE in the last 168 hours. Recent Labs  Lab 03/15/18 1524  AMMONIA 10   Cardiac Enzymes: No results for input(s): CKTOTAL, CKMB, CKMBINDEX, TROPONINI in the last 168 hours. BNP (last 3 results) Recent Labs    08/04/17 0416  BNP 158.6*   CBG: No results for input(s): GLUCAP in the last 168 hours. Time spent: 35 minutes  Signed:  Berle Mull  [facility]  03/19/2018  , 11:19 AM

## 2018-03-19 NOTE — Progress Notes (Signed)
Report called to Caryl Asp, Therapist, sports at [facility]. All questions answered. Gave her a callback number in case questions arise.   Fritz Pickerel, RN

## 2018-03-19 NOTE — Clinical Social Work Note (Signed)
CSW facilitated patient discharge including contacting patient family and facility to confirm patient discharge plans. Clinical information faxed to facility and family agreeable with plan. CSW arranged ambulance transport via [place] to [facility]. RN has already called report.  CSW will sign off for now as social work intervention is no longer needed. Please consult [place] again if new needs arise.  Dayton Scrape, [place]

## 2018-03-19 NOTE — Clinical Social Work Note (Signed)
[facility] can accept patient today. CSW called and notified son. Sent message to notify MD as well. Patient will need PTAR.  Dayton Scrape, [place]

## 2018-03-20 DIAGNOSIS — I5030 Unspecified diastolic (congestive) heart failure: Secondary | ICD-10-CM | POA: Diagnosis not present

## 2018-03-20 DIAGNOSIS — I2510 Atherosclerotic heart disease of native coronary artery without angina pectoris: Secondary | ICD-10-CM | POA: Diagnosis not present

## 2018-03-20 DIAGNOSIS — J449 Chronic obstructive pulmonary disease, unspecified: Secondary | ICD-10-CM | POA: Diagnosis not present

## 2018-03-20 DIAGNOSIS — E119 Type 2 diabetes mellitus without complications: Secondary | ICD-10-CM | POA: Diagnosis not present

## 2018-03-22 NOTE — Consult Note (Signed)
            [facility] CM Primary Care Navigator  03/22/2018  BRUNO LEACH 1941/02/24 [id number]   Attempt to seepatient at the bedside to identify possible discharge needs buthe wasalready dischargedper staff. Patient went to skilled nursing facility per therapy recommendation ([facility]).  Per Inpatient social worker note, patient's family is currently unable to care for him at their home given patient's current needs and fall risk.   Per MD note,presented to [facility] with complaints of gradually worsening shortness of breath and productive cough with yellow mucus and intermittent confusion. (COPD exacerbation likely secondary to acute bronchitis, HTN, diarrhea, acute encephalopathy)  Primary care provider's office is listed as providing transition of care (TOC) follow-up.  Patient has discharge instruction to follow-up with primary care provider in 1 week.   For additional questions please contact:  Edwena Felty A. Mechel Haggard, BSN, RN-BC [facility] PRIMARY CARE Navigator Cell: [phone]

## 2018-03-23 DIAGNOSIS — I10 Essential (primary) hypertension: Secondary | ICD-10-CM | POA: Diagnosis not present

## 2018-03-23 DIAGNOSIS — I2510 Atherosclerotic heart disease of native coronary artery without angina pectoris: Secondary | ICD-10-CM | POA: Diagnosis not present

## 2018-03-23 DIAGNOSIS — J449 Chronic obstructive pulmonary disease, unspecified: Secondary | ICD-10-CM | POA: Diagnosis not present

## 2018-03-23 DIAGNOSIS — E119 Type 2 diabetes mellitus without complications: Secondary | ICD-10-CM | POA: Diagnosis not present

## 2018-03-27 DIAGNOSIS — I2510 Atherosclerotic heart disease of native coronary artery without angina pectoris: Secondary | ICD-10-CM | POA: Diagnosis not present

## 2018-03-27 DIAGNOSIS — J449 Chronic obstructive pulmonary disease, unspecified: Secondary | ICD-10-CM | POA: Diagnosis not present

## 2018-03-27 DIAGNOSIS — I5030 Unspecified diastolic (congestive) heart failure: Secondary | ICD-10-CM | POA: Diagnosis not present

## 2018-03-27 DIAGNOSIS — E119 Type 2 diabetes mellitus without complications: Secondary | ICD-10-CM | POA: Diagnosis not present

## 2018-03-29 ENCOUNTER — Other Ambulatory Visit: Payer: Self-pay | Admitting: *Deleted

## 2018-03-29 NOTE — Patient Outreach (Signed)
[facility]) Care Management  03/29/2018  [name] 1940/12/20 [id number]   Per Sarah,SW at [facility], patient discharge plan is undecided at this point. He was from home.  Patient has a pending back surgery per Judson Roch and son has indicated patient may need more care than he  can provide.  Patient has a history of HF and COPD.  He had recent inpatient admission for COPD exacerbation. Plan to monitor for any [facility] care management needs closer to discharge and collaborate with [facility] as indicated. Royetta Crochet. Laymond Purser, RN, BSN, [place] [phone]) Business Cell  [phone]) Toll Free Office

## 2018-05-10 ENCOUNTER — Emergency Department (HOSPITAL_COMMUNITY): Payer: Medicare Other

## 2018-05-10 ENCOUNTER — Inpatient Hospital Stay (HOSPITAL_COMMUNITY)
Admission: EM | Admit: 2018-05-10 | Discharge: 2018-05-14 | DRG: 190 | Disposition: A | Discharge status: Skilled Nursing Facility | Payer: Medicare Other | Attending: Family Medicine | Admitting: Family Medicine

## 2018-05-10 ENCOUNTER — Encounter (HOSPITAL_COMMUNITY): Payer: Self-pay | Admitting: Emergency Medicine

## 2018-05-10 ENCOUNTER — Other Ambulatory Visit: Payer: Self-pay

## 2018-05-10 DIAGNOSIS — R0989 Other specified symptoms and signs involving the circulatory and respiratory systems: Secondary | ICD-10-CM

## 2018-05-10 DIAGNOSIS — R3129 Other microscopic hematuria: Secondary | ICD-10-CM | POA: Diagnosis present

## 2018-05-10 DIAGNOSIS — R0600 Dyspnea, unspecified: Secondary | ICD-10-CM | POA: Diagnosis present

## 2018-05-10 DIAGNOSIS — Z951 Presence of aortocoronary bypass graft: Secondary | ICD-10-CM

## 2018-05-10 DIAGNOSIS — I714 Abdominal aortic aneurysm, without rupture, unspecified: Secondary | ICD-10-CM | POA: Diagnosis present

## 2018-05-10 DIAGNOSIS — I4891 Unspecified atrial fibrillation: Secondary | ICD-10-CM | POA: Diagnosis present

## 2018-05-10 DIAGNOSIS — J449 Chronic obstructive pulmonary disease, unspecified: Secondary | ICD-10-CM

## 2018-05-10 DIAGNOSIS — N179 Acute kidney failure, unspecified: Secondary | ICD-10-CM | POA: Diagnosis present

## 2018-05-10 DIAGNOSIS — Z8719 Personal history of other diseases of the digestive system: Secondary | ICD-10-CM

## 2018-05-10 DIAGNOSIS — J9621 Acute and chronic respiratory failure with hypoxia: Secondary | ICD-10-CM | POA: Diagnosis present

## 2018-05-10 DIAGNOSIS — I110 Hypertensive heart disease with heart failure: Secondary | ICD-10-CM | POA: Diagnosis present

## 2018-05-10 DIAGNOSIS — Z79899 Other long term (current) drug therapy: Secondary | ICD-10-CM

## 2018-05-10 DIAGNOSIS — Z9981 Dependence on supplemental oxygen: Secondary | ICD-10-CM

## 2018-05-10 DIAGNOSIS — E785 Hyperlipidemia, unspecified: Secondary | ICD-10-CM | POA: Diagnosis present

## 2018-05-10 DIAGNOSIS — J9620 Acute and chronic respiratory failure, unspecified whether with hypoxia or hypercapnia: Secondary | ICD-10-CM | POA: Diagnosis present

## 2018-05-10 DIAGNOSIS — I2510 Atherosclerotic heart disease of native coronary artery without angina pectoris: Secondary | ICD-10-CM | POA: Diagnosis present

## 2018-05-10 DIAGNOSIS — Z888 Allergy status to other drugs, medicaments and biological substances status: Secondary | ICD-10-CM

## 2018-05-10 DIAGNOSIS — I482 Chronic atrial fibrillation, unspecified: Secondary | ICD-10-CM | POA: Diagnosis present

## 2018-05-10 DIAGNOSIS — M810 Age-related osteoporosis without current pathological fracture: Secondary | ICD-10-CM | POA: Diagnosis present

## 2018-05-10 DIAGNOSIS — I472 Ventricular tachycardia: Secondary | ICD-10-CM | POA: Diagnosis not present

## 2018-05-10 DIAGNOSIS — I252 Old myocardial infarction: Secondary | ICD-10-CM

## 2018-05-10 DIAGNOSIS — J9611 Chronic respiratory failure with hypoxia: Secondary | ICD-10-CM | POA: Diagnosis present

## 2018-05-10 DIAGNOSIS — J189 Pneumonia, unspecified organism: Secondary | ICD-10-CM | POA: Diagnosis present

## 2018-05-10 DIAGNOSIS — Z955 Presence of coronary angioplasty implant and graft: Secondary | ICD-10-CM

## 2018-05-10 DIAGNOSIS — Z885 Allergy status to narcotic agent status: Secondary | ICD-10-CM

## 2018-05-10 DIAGNOSIS — E890 Postprocedural hypothyroidism: Secondary | ICD-10-CM | POA: Diagnosis present

## 2018-05-10 DIAGNOSIS — I272 Pulmonary hypertension, unspecified: Secondary | ICD-10-CM | POA: Clinically undetermined

## 2018-05-10 DIAGNOSIS — R918 Other nonspecific abnormal finding of lung field: Secondary | ICD-10-CM | POA: Diagnosis present

## 2018-05-10 DIAGNOSIS — Z87442 Personal history of urinary calculi: Secondary | ICD-10-CM

## 2018-05-10 DIAGNOSIS — J440 Chronic obstructive pulmonary disease with acute lower respiratory infection: Secondary | ICD-10-CM | POA: Diagnosis present

## 2018-05-10 DIAGNOSIS — E1151 Type 2 diabetes mellitus with diabetic peripheral angiopathy without gangrene: Secondary | ICD-10-CM | POA: Diagnosis present

## 2018-05-10 DIAGNOSIS — I480 Paroxysmal atrial fibrillation: Secondary | ICD-10-CM | POA: Diagnosis present

## 2018-05-10 DIAGNOSIS — R443 Hallucinations, unspecified: Secondary | ICD-10-CM | POA: Diagnosis not present

## 2018-05-10 DIAGNOSIS — Z8249 Family history of ischemic heart disease and other diseases of the circulatory system: Secondary | ICD-10-CM

## 2018-05-10 DIAGNOSIS — J9612 Chronic respiratory failure with hypercapnia: Secondary | ICD-10-CM | POA: Diagnosis present

## 2018-05-10 DIAGNOSIS — J441 Chronic obstructive pulmonary disease with (acute) exacerbation: Principal | ICD-10-CM | POA: Diagnosis present

## 2018-05-10 DIAGNOSIS — I5032 Chronic diastolic (congestive) heart failure: Secondary | ICD-10-CM | POA: Diagnosis present

## 2018-05-10 DIAGNOSIS — Z66 Do not resuscitate: Secondary | ICD-10-CM | POA: Diagnosis present

## 2018-05-10 DIAGNOSIS — F0390 Unspecified dementia without behavioral disturbance: Secondary | ICD-10-CM | POA: Diagnosis present

## 2018-05-10 DIAGNOSIS — K219 Gastro-esophageal reflux disease without esophagitis: Secondary | ICD-10-CM | POA: Diagnosis present

## 2018-05-10 DIAGNOSIS — Z87891 Personal history of nicotine dependence: Secondary | ICD-10-CM

## 2018-05-10 DIAGNOSIS — E118 Type 2 diabetes mellitus with unspecified complications: Secondary | ICD-10-CM

## 2018-05-10 DIAGNOSIS — R0602 Shortness of breath: Secondary | ICD-10-CM | POA: Diagnosis present

## 2018-05-10 DIAGNOSIS — E860 Dehydration: Secondary | ICD-10-CM | POA: Diagnosis present

## 2018-05-10 DIAGNOSIS — J431 Panlobular emphysema: Secondary | ICD-10-CM

## 2018-05-10 DIAGNOSIS — I10 Essential (primary) hypertension: Secondary | ICD-10-CM | POA: Diagnosis present

## 2018-05-10 DIAGNOSIS — J181 Lobar pneumonia, unspecified organism: Secondary | ICD-10-CM

## 2018-05-10 DIAGNOSIS — E119 Type 2 diabetes mellitus without complications: Secondary | ICD-10-CM

## 2018-05-10 DIAGNOSIS — R410 Disorientation, unspecified: Secondary | ICD-10-CM | POA: Diagnosis not present

## 2018-05-10 HISTORY — DX: Chronic obstructive pulmonary disease with (acute) exacerbation: J44.1

## 2018-05-10 HISTORY — DX: Acute kidney failure, unspecified: N17.9

## 2018-05-10 HISTORY — DX: Syncope and collapse: R55

## 2018-05-10 HISTORY — DX: Essential (primary) hypertension: I10

## 2018-05-10 HISTORY — DX: Wedge compression fracture of first lumbar vertebra, initial encounter for closed fracture: S32.010A

## 2018-05-10 HISTORY — DX: Olecranon bursitis, unspecified elbow: M70.20

## 2018-05-10 HISTORY — DX: Low back pain: M54.5

## 2018-05-10 HISTORY — DX: Other nonspecific abnormal finding of lung field: R91.8

## 2018-05-10 HISTORY — DX: Pneumonia, unspecified organism: J18.9

## 2018-05-10 LAB — CBC
HCT: 43.6 % (ref 39.0–52.0)
Hemoglobin: 14.3 g/dL (ref 13.0–17.0)
MCH: 33 pg (ref 26.0–34.0)
MCHC: 32.8 g/dL (ref 30.0–36.0)
MCV: 100.7 fL — ABNORMAL HIGH (ref 80.0–100.0)
Platelets: 164 10*3/uL (ref 150–400)
RBC: 4.33 MIL/uL (ref 4.22–5.81)
RDW: 12.9 % (ref 11.5–15.5)
WBC: 4.3 10*3/uL (ref 4.0–10.5)
nRBC: 0 % (ref 0.0–0.2)

## 2018-05-10 LAB — COMPREHENSIVE METABOLIC PANEL
ALT: 11 U/L (ref 0–44)
AST: 15 U/L (ref 15–41)
Albumin: 3.2 g/dL — ABNORMAL LOW (ref 3.5–5.0)
Alkaline Phosphatase: 47 U/L (ref 38–126)
Anion gap: 14 (ref 5–15)
BILIRUBIN TOTAL: 1.3 mg/dL — AB (ref 0.3–1.2)
BUN: 8 mg/dL (ref 8–23)
CO2: 25 mmol/L (ref 22–32)
CREATININE: 0.9 mg/dL (ref 0.61–1.24)
Calcium: 8.5 mg/dL — ABNORMAL LOW (ref 8.9–10.3)
Chloride: 100 mmol/L (ref 98–111)
GFR calc Af Amer: >60 mL/min (ref >60)
GFR calc non Af Amer: >60 mL/min (ref >60)
Glucose, Bld: 189 mg/dL — ABNORMAL HIGH (ref 70–99)
POTASSIUM: 3.8 mmol/L (ref 3.5–5.1)
Sodium: 139 mmol/L (ref 135–145)
Total Protein: 6.3 g/dL — ABNORMAL LOW (ref 6.5–8.1)

## 2018-05-10 LAB — I-STAT TROPONIN, ED: TROPONIN I, POC: 0.01 ng/mL (ref 0.00–0.08)

## 2018-05-10 LAB — URINALYSIS, ROUTINE W REFLEX MICROSCOPIC
Bilirubin Urine: NEGATIVE
Glucose, UA: 150 mg/dL — AB
Ketones, ur: NEGATIVE mg/dL
Leukocytes,Ua: NEGATIVE
Nitrite: NEGATIVE
Protein, ur: 100 mg/dL — AB
RBC / HPF: >50 RBC/hpf — ABNORMAL HIGH (ref 0–5)
Specific Gravity, Urine: 1.025 (ref 1.005–1.030)
pH: 6 (ref 5.0–8.0)

## 2018-05-10 LAB — TSH: TSH: 0.588 u[IU]/mL (ref 0.350–4.500)

## 2018-05-10 LAB — CBC WITH DIFFERENTIAL/PLATELET
Abs Immature Granulocytes: 0.03 10*3/uL (ref 0.00–0.07)
Basophils Absolute: 0 10*3/uL (ref 0.0–0.1)
Basophils Relative: 0 %
EOS ABS: 0.2 10*3/uL (ref 0.0–0.5)
Eosinophils Relative: 2 %
HEMATOCRIT: 45.4 % (ref 39.0–52.0)
Hemoglobin: 14.4 g/dL (ref 13.0–17.0)
Immature Granulocytes: 0 %
Lymphocytes Relative: 17 %
Lymphs Abs: 1.4 10*3/uL (ref 0.7–4.0)
MCH: 31.4 pg (ref 26.0–34.0)
MCHC: 31.7 g/dL (ref 30.0–36.0)
MCV: 98.9 fL (ref 80.0–100.0)
Monocytes Absolute: 0.5 10*3/uL (ref 0.1–1.0)
Monocytes Relative: 7 %
Neutro Abs: 5.7 10*3/uL (ref 1.7–7.7)
Neutrophils Relative %: 74 %
Platelets: 148 10*3/uL — ABNORMAL LOW (ref 150–400)
RBC: 4.59 MIL/uL (ref 4.22–5.81)
RDW: 12.8 % (ref 11.5–15.5)
WBC: 7.8 10*3/uL (ref 4.0–10.5)
nRBC: 0 % (ref 0.0–0.2)

## 2018-05-10 LAB — BRAIN NATRIURETIC PEPTIDE: B Natriuretic Peptide: 195.9 pg/mL — ABNORMAL HIGH (ref 0.0–100.0)

## 2018-05-10 LAB — GLUCOSE, CAPILLARY
Glucose-Capillary: 431 mg/dL — ABNORMAL HIGH (ref 70–99)
Glucose-Capillary: 229 mg/dL — ABNORMAL HIGH (ref 70–99)

## 2018-05-10 LAB — PROCALCITONIN

## 2018-05-10 LAB — INFLUENZA PANEL BY PCR (TYPE A & B)
Influenza A By PCR: NEGATIVE
Influenza B By PCR: NEGATIVE

## 2018-05-10 LAB — CREATININE, SERUM
Creatinine, Ser: 1.29 mg/dL — ABNORMAL HIGH (ref 0.61–1.24)
GFR calc Af Amer: >60 mL/min (ref >60)
GFR calc non Af Amer: 53 mL/min — ABNORMAL LOW (ref >60)

## 2018-05-10 LAB — TROPONIN I: Troponin I: <0.03 ng/mL (ref <0.03)

## 2018-05-10 LAB — GLUCOSE, RANDOM: Glucose, Bld: 425 mg/dL — ABNORMAL HIGH (ref 70–99)

## 2018-05-10 MED ORDER — ALBUTEROL SULFATE (2.5 MG/3ML) 0.083% IN NEBU
2.5000 mg | INHALATION_SOLUTION | Freq: Four times a day (QID) | RESPIRATORY_TRACT | Status: DC
  Administered 2018-05-10 (×2): 2.5 mg via RESPIRATORY_TRACT
  Filled 2018-05-10 (×3): qty 3

## 2018-05-10 MED ORDER — CALCIUM CARBONATE-VITAMIN D 500-200 MG-UNIT PO TABS
1.0000 | ORAL_TABLET | Freq: Two times a day (BID) | ORAL | Status: DC
  Administered 2018-05-11 – 2018-05-14 (×7): 1 via ORAL
  Filled 2018-05-10 (×8): qty 1

## 2018-05-10 MED ORDER — POTASSIUM CHLORIDE CRYS ER 20 MEQ PO TBCR
20.0000 meq | EXTENDED_RELEASE_TABLET | Freq: Every day | ORAL | Status: DC
  Administered 2018-05-10 – 2018-05-14 (×5): 20 meq via ORAL
  Filled 2018-05-10 (×6): qty 1

## 2018-05-10 MED ORDER — RISAQUAD PO CAPS
ORAL_CAPSULE | Freq: Every day | ORAL | Status: DC
  Administered 2018-05-10 – 2018-05-14 (×5): 1 via ORAL
  Filled 2018-05-10 (×5): qty 1

## 2018-05-10 MED ORDER — SODIUM CHLORIDE 0.9 % IV SOLN
500.0000 mg | INTRAVENOUS | Status: AC
  Administered 2018-05-10: 500 mg via INTRAVENOUS
  Filled 2018-05-10: qty 500

## 2018-05-10 MED ORDER — SODIUM CHLORIDE 0.9 % IV SOLN
1.0000 g | INTRAVENOUS | Status: DC
  Administered 2018-05-10: 1 g via INTRAVENOUS
  Filled 2018-05-10 (×2): qty 10

## 2018-05-10 MED ORDER — MEMANTINE HCL 10 MG PO TABS
10.0000 mg | ORAL_TABLET | Freq: Every day | ORAL | Status: DC
  Administered 2018-05-10 – 2018-05-14 (×5): 10 mg via ORAL
  Filled 2018-05-10 (×5): qty 1

## 2018-05-10 MED ORDER — ALBUTEROL SULFATE (2.5 MG/3ML) 0.083% IN NEBU: 2.5000 mg | INHALATION_SOLUTION | Freq: Once | RESPIRATORY_TRACT | Status: DC

## 2018-05-10 MED ORDER — INSULIN ASPART 100 UNIT/ML ~~LOC~~ SOLN
0.0000 [IU] | Freq: Every day | SUBCUTANEOUS | Status: DC
  Administered 2018-05-10: 2 [IU] via SUBCUTANEOUS
  Administered 2018-05-11: 4 [IU] via SUBCUTANEOUS
  Administered 2018-05-12: 2 [IU] via SUBCUTANEOUS

## 2018-05-10 MED ORDER — MELATONIN 3 MG PO TABS
6.0000 mg | ORAL_TABLET | Freq: Every day | ORAL | Status: DC
  Administered 2018-05-10 – 2018-05-13 (×4): 6 mg via ORAL
  Filled 2018-05-10 (×5): qty 2

## 2018-05-10 MED ORDER — INFLUENZA VAC SPLIT HIGH-DOSE 0.5 ML IM SUSY
0.5000 mL | PREFILLED_SYRINGE | INTRAMUSCULAR | Status: DC
  Filled 2018-05-10: qty 0.5

## 2018-05-10 MED ORDER — GLUCERNA SHAKE PO LIQD
237.0000 mL | Freq: Three times a day (TID) | ORAL | Status: DC
  Administered 2018-05-10 – 2018-05-14 (×8): 237 mL via ORAL

## 2018-05-10 MED ORDER — AZITHROMYCIN 250 MG PO TABS
500.0000 mg | ORAL_TABLET | Freq: Every day | ORAL | Status: AC
  Administered 2018-05-11 – 2018-05-14 (×4): 500 mg via ORAL
  Filled 2018-05-10 (×4): qty 2

## 2018-05-10 MED ORDER — ALBUTEROL SULFATE (2.5 MG/3ML) 0.083% IN NEBU
5.0000 mg | INHALATION_SOLUTION | Freq: Once | RESPIRATORY_TRACT | Status: AC
  Administered 2018-05-10: 5 mg via RESPIRATORY_TRACT
  Filled 2018-05-10: qty 6

## 2018-05-10 MED ORDER — PREDNISONE 20 MG PO TABS: 40.0000 mg | ORAL_TABLET | Freq: Every day | ORAL | Status: DC

## 2018-05-10 MED ORDER — DILTIAZEM HCL ER COATED BEADS 180 MG PO CP24
180.0000 mg | ORAL_CAPSULE | Freq: Every day | ORAL | Status: DC
  Administered 2018-05-10 – 2018-05-14 (×5): 180 mg via ORAL
  Filled 2018-05-10 (×5): qty 1

## 2018-05-10 MED ORDER — UMECLIDINIUM-VILANTEROL 62.5-25 MCG/INH IN AEPB
1.0000 | INHALATION_SPRAY | Freq: Every day | RESPIRATORY_TRACT | Status: DC
  Filled 2018-05-10: qty 14

## 2018-05-10 MED ORDER — PANTOPRAZOLE SODIUM 40 MG PO TBEC
40.0000 mg | DELAYED_RELEASE_TABLET | Freq: Two times a day (BID) | ORAL | Status: DC
  Administered 2018-05-10 – 2018-05-14 (×9): 40 mg via ORAL
  Filled 2018-05-10 (×10): qty 1

## 2018-05-10 MED ORDER — ENOXAPARIN SODIUM 40 MG/0.4ML ~~LOC~~ SOLN
40.0000 mg | SUBCUTANEOUS | Status: DC
  Administered 2018-05-10 – 2018-05-13 (×4): 40 mg via SUBCUTANEOUS
  Filled 2018-05-10 (×4): qty 0.4

## 2018-05-10 MED ORDER — INSULIN ASPART 100 UNIT/ML ~~LOC~~ SOLN
0.0000 [IU] | Freq: Three times a day (TID) | SUBCUTANEOUS | Status: DC
  Administered 2018-05-10: 9 [IU] via SUBCUTANEOUS
  Administered 2018-05-11: 3 [IU] via SUBCUTANEOUS
  Administered 2018-05-11: 5 [IU] via SUBCUTANEOUS
  Administered 2018-05-11: 7 [IU] via SUBCUTANEOUS
  Administered 2018-05-12: 2 [IU] via SUBCUTANEOUS
  Administered 2018-05-12 (×2): 3 [IU] via SUBCUTANEOUS

## 2018-05-10 MED ORDER — ATORVASTATIN CALCIUM 40 MG PO TABS
40.0000 mg | ORAL_TABLET | Freq: Every day | ORAL | Status: DC
  Administered 2018-05-10 – 2018-05-14 (×5): 40 mg via ORAL
  Filled 2018-05-10 (×5): qty 1

## 2018-05-10 MED ORDER — FUROSEMIDE 20 MG PO TABS
20.0000 mg | ORAL_TABLET | Freq: Every day | ORAL | Status: DC
  Administered 2018-05-10 – 2018-05-14 (×5): 20 mg via ORAL
  Filled 2018-05-10 (×6): qty 1

## 2018-05-10 MED ORDER — LORATADINE 10 MG PO TABS
10.0000 mg | ORAL_TABLET | Freq: Every day | ORAL | Status: DC
  Administered 2018-05-10 – 2018-05-14 (×5): 10 mg via ORAL
  Filled 2018-05-10 (×5): qty 1

## 2018-05-10 MED ORDER — UMECLIDINIUM-VILANTEROL 62.5-25 MCG/INH IN AEPB
1.0000 | INHALATION_SPRAY | Freq: Every day | RESPIRATORY_TRACT | Status: DC
  Administered 2018-05-11 – 2018-05-14 (×5): 1 via RESPIRATORY_TRACT
  Filled 2018-05-10: qty 14

## 2018-05-10 MED ORDER — PREDNISONE 20 MG PO TABS
40.0000 mg | ORAL_TABLET | Freq: Every day | ORAL | Status: DC
  Administered 2018-05-10 – 2018-05-12 (×3): 40 mg via ORAL
  Filled 2018-05-10 (×5): qty 2

## 2018-05-10 MED ORDER — ALUM & MAG HYDROXIDE-SIMETH 200-200-20 MG/5ML PO SUSP: 30.0000 mL | ORAL | Status: DC | PRN

## 2018-05-10 MED ORDER — IPRATROPIUM-ALBUTEROL 0.5-2.5 (3) MG/3ML IN SOLN: 3.0000 mL | Freq: Four times a day (QID) | RESPIRATORY_TRACT | Status: DC

## 2018-05-10 NOTE — Progress Notes (Signed)
Occupational Therapy Evaluation Patient Details Name: [name] MRN: [id number] DOB: 08/02/1940 Today's Date: 05/10/2018    History of Present Illness Pt. with PMH of COPD, chronic hypoxic respiratory failure on 2 LPM, CAD S/P CABG, HTN, HLD, chronic diastolic CHF, PAF; admitted on 03/15/2018, presented with complaint of shortness of breath and cough, was found to have COPD exacerbation   Clinical Impression   PTA, pt was living at home with his son, and reports he was independent with ADLs and required assistance from son and caregiver for IADL. Pt reports he was on 2lnc at all times and was using a spc and RW, pta. Pt currently requires minguard for ADL and functional mobility at RW level. No caregiver present to determine pt's baseline cognitive deficits with dementia dx. Pt on 2lnc throughout session, SpO2 88% with functional mobility, SpO2 98% after pursed lip breathing.  Due to decline in current level of function, pt would benefit from acute OT to address establish goals to facilitate safe D/C home. At this time, recommend [place] with S/assistance 24/7  follow-up.      Follow Up Recommendations  Home health OT;Supervision/Assistance - 24 hour    Equipment Recommendations  None recommended by OT    Recommendations for Other Services PT consult     Precautions / Restrictions Precautions Precautions: Fall(watch sats) Restrictions Weight Bearing Restrictions: No      Mobility Bed Mobility Overal bed mobility: Modified Independent                Transfers Overall transfer level: Needs assistance Equipment used: Rolling walker (2 wheeled) Transfers: Sit to/from Stand Sit to Stand: Min guard         General transfer comment: min guard for stability in standing, vc for pursed lip breathing while standing     Balance Overall balance assessment: Needs assistance Sitting-balance support: No upper extremity supported Sitting balance-Leahy Scale: Fair     Standing  balance support: No upper extremity supported;During functional activity Standing balance-Leahy Scale: Fair Standing balance comment: pt stood at sink for 73min to complete grooming task                           ADL either performed or assessed with clinical judgement   ADL Overall ADL's : Needs assistance/impaired Eating/Feeding: Set up;Sitting   Grooming: Wash/dry hands;Min guard;Cueing for sequencing;Standing Grooming Details (indicate cue type and reason): pt performed grooming task while standing at sink, required vc for sequencing to unscrew cap to dispense soap, vc to dry hands  Upper Body Bathing: Min guard   Lower Body Bathing: Min guard;Sit to/from stand   Upper Body Dressing : Min guard   Lower Body Dressing: Min guard;Sit to/from stand   Toilet Transfer: Min guard;Cueing for safety;Ambulation Toilet Transfer Details (indicate cue type and reason): simulated transfer from EOB in room mobiltiy with return to EOB, pt required vc for sare hand placement, minguard for stability and use of DME Toileting- Clothing Manipulation and Hygiene: Min guard Toileting - Clothing Manipulation Details (indicate cue type and reason): per nsg, pt independent with clothing manipulation while standing     Functional mobility during ADLs: Min guard;Rolling walker General ADL Comments: minguard for stability and to monitor SpO2 levels;pt required multiple vc for pursed lip breathing during ADL, SpO2 88%-95% during ADL     Vision Baseline Vision/History: Wears glasses Wears Glasses: At all times Patient Visual Report: No change from baseline  Perception     Praxis      Pertinent Vitals/Pain Pain Assessment: No/denies pain     Hand Dominance Right   Extremity/Trunk Assessment Upper Extremity Assessment Upper Extremity Assessment: Generalized weakness   Lower Extremity Assessment Lower Extremity Assessment: Defer to PT evaluation       Communication  Communication Communication: HOH   Cognition Arousal/Alertness: Awake/alert Behavior During Therapy: WFL for tasks assessed/performed Overall Cognitive Status: No family/caregiver present to determine baseline cognitive functioning Area of Impairment: Problem solving;Safety/judgement;Memory                     Memory: Decreased short-term memory   Safety/Judgement: Decreased awareness of safety;Decreased awareness of deficits   Problem Solving: Difficulty sequencing;Slow processing General Comments: required vc for safe handplacement, pt attempted to use soap without unscrewing cap, unaware soap did not dispense from bottle,    General Comments  no family/caregiver present during session;pt on 2lnc     Exercises     Shoulder Instructions      Home Living Family/patient expects to be discharged to:: Private residence Living Arrangements: Children Available Help at Discharge: Family;Available 24 hours/day;Personal care attendant;Available PRN/intermittently Type of Home: House Home Access: Ramped entrance     Home Layout: One level     Bathroom Shower/Tub: Teacher, early years/pre: Standard     Home Equipment: Environmental consultant - 4 wheels;Cane - single point;Grab bars - toilet;Grab bars - tub/shower;Tub bench   Additional Comments: Wears 2L home O2      Prior Functioning/Environment Level of Independence: Needs assistance  Gait / Transfers Assistance Needed: pt reports he was using spc/rw for mobility  ADL's / Homemaking Assistance Needed: pt reports he independent with ADL, required assistance from family and caregiver for IADL   Comments: cognitive deficits at baseline, dementia dx        OT Problem List: Decreased activity tolerance;Impaired balance (sitting and/or standing);Decreased cognition;Decreased safety awareness;Decreased knowledge of use of DME or AE;Cardiopulmonary status limiting activity      OT Treatment/Interventions: Self-care/ADL  training;Therapeutic exercise;Patient/family education;Balance training;Cognitive remediation/compensation;Energy conservation;DME and/or AE instruction    OT Goals(Current goals can be found in the care plan section) Acute Rehab OT Goals Patient Stated Goal: to go home OT Goal Formulation: With patient Time For Goal Achievement: 05/24/18 Potential to Achieve Goals: Good  OT Frequency: Min 2X/week   Barriers to D/C:            Co-evaluation              AM-PAC OT "6 Clicks" Daily Activity     Outcome Measure Help from another person eating meals?: None Help from another person taking care of personal grooming?: A Little Help from another person toileting, which includes using toliet, bedpan, or urinal?: A Little Help from another person bathing (including washing, rinsing, drying)?: A Little Help from another person to put on and taking off regular upper body clothing?: A Little Help from another person to put on and taking off regular lower body clothing?: A Little 6 Click Score: 19   End of Session Equipment Utilized During Treatment: Gait belt;Rolling walker;Oxygen Nurse Communication: Mobility status  Activity Tolerance: Patient tolerated treatment well Patient left: in bed;with call bell/phone within reach;with bed alarm set  OT Visit Diagnosis: Unsteadiness on feet (R26.81);Other abnormalities of gait and mobility (R26.89);Muscle weakness (generalized) (M62.81);Other symptoms and signs involving cognitive function                Time:  3748-2707 OT Time Calculation (min): 23 min Charges:  OT General Charges $OT Visit: 1 Visit OT Evaluation $OT Eval Moderate Complexity: 1 Mod OT Treatments $Self Care/Home Management : 8-22 mins  Dorinda Hill OTR/L Acute Rehabilitation Services Office: [place] 05/10/2018, 4:36 PM

## 2018-05-10 NOTE — Progress Notes (Signed)
05/10/18 1725  PT Visit Information  Last PT Received On 05/10/18  Assistance Needed +1  History of Present Illness Pt presenting to hospital with SOB secondary to COPD exacerbation. Patient has had multiple hospitalizations for same cause. PMH of COPD, chronic hypoxic respiratory failure on 2 LPM, CAD S/P CABG, HTN, HLD, chronic diastolic CHF, PAF;   Precautions  Precautions Fall;Other (comment) (monitor O2 saturations)  Restrictions  Weight Bearing Restrictions No  Home Living  Family/patient expects to be discharged to: Private residence  Living Arrangements Children  Available Help at Discharge Family;Available 24 hours/day;Personal care attendant;Available PRN/intermittently  Type of [place] One level  Bathroom Shower/Tub Tub/shower unit  Tax adviser - 4 wheels;Cane - single point;Grab bars - toilet;Grab bars - tub/shower;Tub bench;Walker - 2 wheels;BSC  Additional Comments Wears 2L home O2. Reports aide comes 5x/week for 6 hours a day. States son is home most of the time but just had back surgery.   Prior Function  Level of Independence Needs assistance  Gait / Transfers Assistance Needed Reports using a rollator for longer distance mobility. States he does not use AD and/or furniture walks shorter distances  ADL's / [place] pt reports he independent with ADL, required assistance from family and caregiver for IADL  Comments cognitive deficits at baseline, dementia dx  Communication  Communication HOH  Pain Assessment  Pain Assessment No/denies pain  Cognition  Arousal/Alertness Awake/alert  Behavior During Therapy WFL for tasks assessed/performed  Overall Cognitive Status No family/caregiver present to determine baseline cognitive functioning  Area of Impairment Problem solving;Safety/judgement;Memory  Memory Decreased short-term memory  Safety/Judgement Decreased  awareness of safety;Decreased awareness of deficits  Problem Solving Difficulty sequencing;Slow processing  General Comments Patient with hx of dementia at baseline. Patient sitting EOB upon arrival confused about equipment in room.   Upper Extremity Assessment  Upper Extremity Assessment Defer to OT evaluation  Lower Extremity Assessment  Lower Extremity Assessment Generalized weakness  Cervical / Trunk Assessment  Cervical / Trunk Assessment Kyphotic  Bed Mobility  Overal bed mobility Needs Assistance  Bed Mobility Supine to Sit;Sit to Supine  Supine to sit Supervision  Sit to supine Supervision  General bed mobility comments Required supervision for safety for all bed mobility. Required increased time for bed mobility   Transfers  Overall transfer level Needs assistance  Equipment used Rolling walker (2 wheeled)  Transfers Sit to/from Stand  Sit to Stand Min guard  General transfer comment Required min guard for safety in standing with use of RW. Verbal cues for hand placement prior to standing when using RW.  Ambulation/Gait  Ambulation/Gait assistance Min guard  Gait Distance (Feet) 125 Feet  Assistive device Rolling walker (2 wheeled)  Gait Pattern/deviations Step-through pattern;Decreased step length - right;Decreased step length - left;Decreased stride length  General Gait Details Patient ambulated with min guard and use of RW for safety. Patient with SOB while ambulating. Oxygen saturations ranging from 84-95% on 2L during mobility. Required standing rest x2 during ambulation secondary to SOB. Oxygen returned to >90% on 2L following pursed lip breathing technique and seated rest break  Gait velocity decreased  Gait velocity interpretation <1.8 ft/sec, indicate of risk for recurrent falls  Balance  Overall balance assessment Needs assistance  Sitting-balance support No upper extremity supported;Feet supported  Sitting balance-Leahy Scale Good  Standing balance support  Bilateral upper extremity supported  Standing balance-Leahy Scale Poor  Standing balance comment  reliant on BUE support and use of RW to maintain standing balance  PT - End of Session  Equipment Utilized During Treatment Gait belt;Oxygen (2l)  Activity Tolerance Patient limited by fatigue;Other (comment) (oxygen)  Patient left in bed;with call bell/phone within reach;with bed alarm set  Nurse Communication Mobility status  PT Assessment  PT Recommendation/Assessment Patient needs continued PT services  PT Visit Diagnosis Unsteadiness on feet (R26.81);Muscle weakness (generalized) (M62.81);Other abnormalities of gait and mobility (R26.89)  PT Problem List Decreased strength;Decreased range of motion;Decreased activity tolerance;Decreased balance;Decreased mobility;Decreased cognition;Decreased knowledge of use of DME;Decreased safety awareness;Decreased knowledge of precautions;Cardiopulmonary status limiting activity  PT Plan  PT Frequency (ACUTE ONLY) Min 3X/week  PT Treatment/Interventions (ACUTE ONLY) DME instruction;Gait training;Stair training;Functional mobility training;Therapeutic activities;Therapeutic exercise;Balance training;Cognitive remediation;Patient/family education  AM-PAC PT "6 Clicks" Mobility Outcome Measure (Version 2)  Help needed turning from your back to your side while in a flat bed without using bedrails? 4  Help needed moving from lying on your back to sitting on the side of a flat bed without using bedrails? 4  Help needed moving to and from a bed to a chair (including a wheelchair)? 3  Help needed standing up from a chair using your arms (e.g., wheelchair or bedside chair)? 3  Help needed to walk in hospital room? 3  Help needed climbing 3-5 steps with a railing?  2  6 Click Score 19  Consider Recommendation of Discharge To: Home with [facility]  PT Recommendation  Follow Up Recommendations Home health PT;Supervision/Assistance - 24 hour  PT equipment None  recommended by PT  Individuals Consulted  Consulted and Agree with Results and Recommendations Patient  Acute Rehab PT Goals  Patient Stated Goal to go home  PT Goal Formulation With patient  Time For Goal Achievement 05/24/18  Potential to Achieve Goals Fair  PT Time Calculation  PT Start Time (ACUTE ONLY) 1647  PT Stop Time (ACUTE ONLY) 1716  PT Time Calculation (min) (ACUTE ONLY) 29 min  PT General Charges  $$ ACUTE PT VISIT 1 Visit  PT Evaluation  $PT Eval Moderate Complexity 1 Mod  PT Treatments  $Gait Training 8-22 mins   Patient admitted to hospital secondary to problems above and with deficits below. Patient ambulated with min guard and use of RW. Oxygen saturations ranging from 84-95% on 2L during functional mobility. Returned to >90% on 2L following pursed lip breathing and seated rest break. Patient states he gets out of breath quicker than usual. Given functional mobility deficits and poor activity tolerance, recommending HHPT to improve mobility, activity tolerance and balance. Patient will benefit from acute physical therapy to maximize independence and safety with functional mobility.   Erick Blinks, SPT

## 2018-05-10 NOTE — ED Notes (Signed)
Admitting team at bedside.

## 2018-05-10 NOTE — ED Notes (Signed)
Patient transported to X-ray 

## 2018-05-10 NOTE — ED Triage Notes (Addendum)
Pt in from home via [facility] with sob, cough since last night. Sputum yellow and blood tinged per pt, denies cp. Has hx of COPD, wears 2LNC at home, CABG x 5. Sats were 86%, audible wheezes when EMS arrived. Given 10mg  Alb and 0.5mg  Atrovent en route. Able to speak in short sentences, a&ox4

## 2018-05-10 NOTE — H&P (Addendum)
[facility] Admission History and Physical Service Pager: [phone]  Patient name: [name] Medical record number: [id number] Date of birth: 12-30-1940 Age: 78 y.o. Gender: male  Primary Care Provider: Reubin Milan, MD Consultants: None Code Status: DNR/DNI  Chief Complaint: SOB  Assessment and Plan: [name] is a 78 y.o. male presenting with SOB . PMH is significant for COPD (on 2L [place]), CAD s/p CABG x5, HTN, HLD, CHF, and paroxysmal a-fib (not on anticoagulation due to fall risk), and dementia.  #Dyspnea secondary to COPD exacerbation: Patient with mild wheezing and desaturations with exertion on home 2L. Hx of multiple admissions for COPD, most recently 03/15/18. S/p albuterol and atrovent tx in EMS and IV azithromycin 500 mg in the ED. CXR demonstrating increased lung markings on the left from prior, recommending short follow-up. Patient afebrile without leukocytosis, so low suspicion for PNA but given pt's fragility, will do follow-up CXR and test for flu.  Patient presented with rhonchi, no crackles.  No pleural effusions or cardiomegaly on chest x-ray, no pitting edema either.  Echo 07/2017 showed EF 60-65% with some diastolic dysfunction.  Will get BNP to assess for this.  Patient mildly tachycardic low 100s, with EKG showing atrial flutter versus A. fib.  I-STAT troponin less than 0.031.  Cannot rule out dyspnea related to cardiac arrhythmia, however rate controlled.  Will monitor closely for this. - Admitted to telemetry, Dr. McDiarmid attending - Repeat CXR in the AM - Rapid flu/ droplet precautions - Anoro Ellipta daily - Albuterol nebulizer q6h - Start azithromycin 500 mg daily x5 days - Start prednisone 40 mg daily x5 days -Trend troponins -Repeat EKG in the a.m.  #Paroxysmal A-fib: In atrial fibrillation on initial EKG in the ED. Not on anticoagulation due to bleeding/Newman risk.  - Continue home diltiazem 180 mg - telemetry -Repeat  EKG in a.m.  #HFrEF:  Last echo 08/06/17 with EF 60-65%, mild LVH, mild AS, moderately thickened mitral valve, moderately dilated Jeffery, mildly dialted RA. Patient with diffuse rhonchi but no crackles or peripheral edema that would be concerning for volume overload. - Continue home Lasix 20 mg - F/U troponin - F/U BNP  #T2DM: Hx of T2DM on insulin but currently without medications for diabetes on his med list. Will keep close eye on BGs given hx of diabetes and starting steroids. - F/U A1C - q4h CBGs - sSSI  #Dementia: At baseline per family. - Continue home memantine 10 mg -Can discuss with family possibly discontinuing in the outpatient setting.  #Hyperlipidemia: - Continue home atorvastatin 40 mg -Could consider discontinuing as may not provide as much benefit in this age range  #GERD: - Continue home Protonix and Mylanta - Mylanta  #Seasonal allergies: - Continue home claritin  FEN/GI: Heart healthy Prophylaxis: Lovenox  Disposition: telemetry  History of Present Illness:  [name] is a 78 y.o. male presenting with SOB.  History provided by patient and family members. Patient states that he was in bed last night until a quarter until 8:00 when he became confused and "didn't know who anyone was." Per pt's niece, this morning he was unable to breathe and a neighbor came by and called EMS. He denies fever, chills, runny nose or cough. He denies any sick contacts. He endorses a change in sputum color that is now greenish and "rusty" in color. He is on 2L O2 [place] at home at baseline. He has had multiple hospitalizations for COPD exacerbations in the past.  He smoked 2 ppd for 40 years but no longer smokes.  He endorses weight loss of 25 lbs, which was denied by family members. He denies dysuria, or abdominal pain.  He lives at home and is usually cared for by his son who is currently hospitalized for back surgery. He is currently being cared for by his niece and her husband who  are present at interview. He has a caregiver who comes 6 hours/day to help with cooking and cleaning. He is able to dress and bathe on his own. Per family members, he is in the "early stages" of dementia.  In the ED, O2 desat to 85% while walking on home 2L O2 [place]. CXR with mild increase in lung markings on the L but no definite PNA. Initial EKG w/ a-fib, f/u EKG with preliminary results. EKG UA with large Hgb, RBCs, glucose and protein but negative LE and nitrites.   Review Of Systems: Per HPI with the following additions: Positive for HA and "weight loss", negative for dizziness, fatigue, CP, abdominal pain, dysuria.    ROS  Patient Active Problem List   Diagnosis Date Noted  . Pulmonary nodules   . COPD with acute exacerbation ([name]) 06/10/2016  . Chest pain 02/23/2016  . Lung nodule, solitary   . HCAP (healthcare-associated pneumonia)   . Iron deficiency anemia 02/15/2015  . CAD (coronary artery disease) 09/18/2014  . Compression fracture of L1 lumbar vertebra (H[facility])   . Syncope 08/23/2014  . Low back pain 08/23/2014  . Hypertension 08/23/2014  . COPD (chronic obstructive pulmonary disease) ([place]) 08/23/2014  . Chronic atrial fibrillation 08/23/2014  . Syncope and collapse   . Olecranon bursitis 07/09/2014  . Diabetes mellitus type II, controlled ([name]) 07/01/2014  . Chronic anticoagulation 05/29/2014  . Acute respiratory failure with hypoxia ([name]) 05/14/2014  . Diastolic CHF ([name]) 23/53/6144  . COPD exacerbation ([name])   . CAP (community acquired pneumonia) 04/03/2014  . Allergic rhinitis 02/06/2014  . GERD (gastroesophageal reflux disease) 02/06/2014  . Do not resuscitate 02/06/2014  . Erectile dysfunction 06/18/2010  . Atrial fibrillation ([name]) 11/06/2009  . Peripheral vascular disease ([name]) 09/02/2009  . Carotid artery stenosis 04/16/2009  . ANEMIA, B12 DEFICIENCY 12/02/2008  . COPD GOLD III 05/21/2007  . TOBACCO USE 01/19/2007  . Hyperlipidemia 11/21/2006  . Essential  hypertension 11/21/2006  . Hx of CABG 11/21/2006  . Osteoporosis 11/21/2006    Past Medical History: Past Medical History:  Diagnosis Date  . Alcohol abuse   . Allergic rhinitis   . ANEMIA, B12 DEFICIENCY 12/02/2008  . Arthritis   . CAD (coronary artery disease)    a.  s/p CABG 2011.  Marland Kitchen CAROTID ARTERY STENOSIS 04/16/2009  . Cataract   . Chronic diastolic CHF (congestive heart failure) ([place])   . Chronic respiratory failure ([place])   . COLONIC POLYPS 05/21/2007  . COPD, severe ([place])   . Diabetes mellitus without complication ([place])   . DIVERTICULAR DISEASE 05/21/2007  . Dysrhythmia   . Essential hypertension   . Newman   . GERD (gastroesophageal reflux disease)   . GI BLEEDING 09/09/2008  . Hemoptysis   . HIATAL HERNIA 05/21/2007  . History of kidney stones   . HYPERLIPIDEMIA 11/21/2006  . Internal hemorrhoids   . MYOCARDIAL INFARCTION, HX OF 11/21/2006  . NEPHROLITHIASIS 05/21/2007  . Normocytic anemia   . NSVT (nonsustained ventricular tachycardia) ([place])    a. Holter 2015: NSR, A. Fib/flutter, short runs of NSVT  . OSTEOPOROSIS 11/21/2006  . Osteoporosis   .  Paroxysmal atrial fibrillation ([place])    a. Anticoag stopped due to Newman, rectal bleeding.  . Paroxysmal atrial flutter ([place])   . Pneumonia   . Pulmonary nodule    a. Dx 01/2015 -> advised to f/u PCP for PET.  Marland Kitchen PVD (peripheral vascular disease) ([place])    a. s/p prior iliac stenting.    Past Surgical History: Past Surgical History:  Procedure Laterality Date  . APPENDECTOMY    . CARDIOVERSION N/A 04/16/2014   Procedure: CARDIOVERSION;  Surgeon: Josue Hector, MD;  Location[facility] ENDOSCOPY;  Service: Cardiovascular;  Laterality: N/A;  . CHOLECYSTECTOMY    . CORONARY ARTERY BYPASS GRAFT  2011  . CORONARY ARTERY BYPASS GRAFT     x 5  . CORONARY STENT PLACEMENT     03/07/2001  . HEMORRHOID SURGERY    . INGUINAL HERNIA REPAIR  10/10/08  . MASS EXCISION Left 07/09/2014   Procedure: LEFT ELBOW BURSECTOMY EXCISION MASS;   Surgeon: Iran Planas, MD;  Location: [facility];  Service: Orthopedics;  Laterality: Left;  . OLECRANON BURSECTOMY  07/09/2014  . PTCA    . THYROIDECTOMY     partial    Social History: Social History   Tobacco Use  . Smoking status: Former Smoker    Packs/day: 3.00    Years: 50.00    Pack years: 150.00    Types: Cigarettes    Last attempt to quit: 09/25/2009    Years since quitting: 8.6  . Smokeless tobacco: Never Used  Substance Use Topics  . Alcohol use: Yes    Alcohol/week: 6.0 standard drinks    Types: 6 Standard drinks or equivalent per week    Comment: [place] 2-3 a night  . Drug use: No   Please also refer to relevant sections of EMR.  Family History: Family History  Problem Relation Age of Onset  . Arthritis Mother        rheumatoid  . Heart attack Father   . Heart disease Father   . Heart disease Brother   . Colon cancer Brother 86  . Heart disease Brother   . Stomach cancer Neg Hx     Allergies and Medications: Allergies  Allergen Reactions  . Ticlopidine Hcl Swelling  . Lorazepam Other (See Comments)    Altered mental status from 2 doses of IV Ativan on 05/10/14 HALLUCINATIONS AND DELIRIOUS  . Other Other (See Comments)    Narcotics: Hallucinations and paranoia  . Prednisone Other (See Comments)    NOT TRUE ALLERGY - CONFUSION   No current facility-administered medications on file prior to encounter.    Current Outpatient Medications on File Prior to Encounter  Medication Sig Dispense Refill  . acetaminophen (TYLENOL) 500 MG tablet Take 500 mg by mouth every 6 (six) hours as needed for mild pain or moderate pain.    Marland Kitchen albuterol (PROAIR HFA) 108 (90 Base) MCG/ACT inhaler Inhale 2 puffs into the lungs every 4 (four) hours as needed for wheezing or shortness of breath.     Marland Kitchen alum & mag hydroxide-simeth (MAALOX/MYLANTA) 200-200-20 MG/5ML suspension Take 15 mLs by mouth 3 (three) times daily.    Marland Kitchen atorvastatin (LIPITOR) 40 MG tablet Take 40 mg by mouth daily.     Marland Kitchen azithromycin (ZITHROMAX) 250 MG tablet Take 500 mg by mouth every Monday, Wednesday, and Friday.    . Calcium Carbonate-Vit D-Min (CALTRATE 600+D PLUS) 600-400 MG-UNIT per tablet Chew 1 tablet by mouth 2 (two) times daily.     . cetirizine (ZYRTEC  ALLERGY) 10 MG tablet Take 1 tablet (10 mg total) by mouth every morning.    . diltiazem (CARDIZEM CD) 180 MG 24 hr capsule Take 180 mg by mouth daily.     . furosemide (LASIX) 20 MG tablet Take 20 mg by mouth daily.    Marland Kitchen LACTOBACILLUS PO Take 2 capsules by mouth daily.    Marland Kitchen loperamide (IMODIUM) 2 MG capsule Take 2 mg by mouth as needed for diarrhea or loose stools.     . Melatonin 3 MG TABS Take 6 mg by mouth at bedtime.    . memantine (NAMENDA) 10 MG tablet Take 5-20 mg by mouth See admin instructions. Take one-half tablet for 4 weeks, then one tablet for 4 weeks, then one and one-half tablets for 4 weeks then two tablets at bedtime.    . OXYGEN Inhale 2-4 L into the lungs See admin instructions. Inhale 2 L continuous during the day and 4 L at night for sleep    . pantoprazole (PROTONIX) 40 MG tablet TAKE 1 TABLET BY MOUTH TWICE A DAY (Patient taking differently: Take 40 mg by mouth 2 (two) times daily. ) 180 tablet 1  . potassium chloride SA (K-DUR,KLOR-CON) 20 MEQ tablet Take 20 mEq by mouth daily.    . benzonatate (TESSALON) 100 MG capsule Take 1 capsule (100 mg total) by mouth 3 (three) times daily. (Patient not taking: Reported on 05/10/2018) 20 capsule 0  . feeding supplement, GLUCERNA SHAKE, (GLUCERNA SHAKE) LIQD Take 237 mLs by mouth 3 (three) times daily between meals. (Patient not taking: Reported on 05/10/2018) 16 Can 3  . ipratropium-albuterol (DUONEB) 0.5-2.5 (3) MG/3ML SOLN Take 3 mLs by nebulization 4 (four) times daily. (Patient not taking: Reported on 05/10/2018) 360 mL 0    Objective: BP 106/66   Pulse 95   Temp 97.9 F (36.6 C) (Oral)   Resp 18   Wt 63.5 kg   SpO2 92%   BMI 24.80 kg/m  Exam: General: well-appearing,  NAD Eyes: no scleral icterus, conjunctiva non-erythematous ENTM: moist mucous membranes Cardiovascular: RRR Respiratory: mild wheezing and loud rhonchi throughout, normal WOB, on [place] Gastrointestinal: abdomen soft, nontender MSK: no peripheral edema Derm: no skin rashes or lesions Psych: confused, answers to questions are contradicted by family members  Labs and Imaging: CBC BMET  Recent Labs  Lab 05/10/18 0858  WBC 7.8  HGB 14.4  HCT 45.4  PLT 148*   Recent Labs  Lab 05/10/18 0858  NA 139  K 3.8  CL 100  CO2 25  BUN 8  CREATININE 0.90  GLUCOSE 189*  CALCIUM 8.5*     05/10/2018 EKG: Atrial fibrillation Paired ventricular premature complexes Probable right ventricular hypertrophy Inferior infarct, acute (RCA) Lateral leads are also involved Probable RV involvement, suggest recording right precordial leads Baseline wander in lead(s) V5 poor baseline - need repeat  05/10/2018 CXR: 1. Mild increase in lung markings on the left since prior but no definite pneumonia. Consider short follow-up. 2. COPD.  Mitchell Heir, Medical Student 05/10/2018, 1:36 PM  RESIDENT ATTESTATION OF STUDENT NOTE   I have seen and examined this patient.   I have discussed the findings and exam with the medical student and agree with the above note, which I have edited appropriately. I helped develop the management plan that is described in the student's note, and I agree with the content.   Bonnita Hollow, MD 05/10/2018, 4:25 PM   [place] Intern pager: [phone], text pages welcome

## 2018-05-10 NOTE — ED Notes (Signed)
RN ambulated pt on 2L [place]. Pt endorses SOB while ambulating. Oxygen saturation down to 85%. Pt returned to bed. EDP notified.

## 2018-05-10 NOTE — ED Provider Notes (Signed)
[facility] EMERGENCY DEPARTMENT Provider Note   CSN: [id number] Arrival date & time: 05/10/18  6237  History   Chief Complaint Shortness of breath  HPI [name] is a 78 y.o. male with past medical history significant for CAD, CHF, chronic respiratory failure on 2 L oxygen, atrial fibrillation, MI, DM presents for evaluation of shortness of breath.  Patient states he has been short of breath x2 days.  Patient is unsure if he has been using his home albuterol inhalers.  Admits to cough productive of yellow sputum.  Denies fever, chills, nausea, vomiting, chest pain, abdominal pain, diarrhea, dysuria.  Patient given albuterol, Atrovent, Solu-Medrol by EMS.  Hypoxia 86% with audible wheezing on EMS arrival.  Patient not wearing home oxygen at that time.  Patient no longer on anticoagulation for atrial fibrillation secondary to multiple falls with head trauma as well as history of GI bleed.  History provided by patient.  No interpreter was used.  [facility]  [facility]  Past Medical History:  Diagnosis Date  . Alcohol abuse   . Allergic rhinitis   . ANEMIA, B12 DEFICIENCY 12/02/2008  . Arthritis   . CAD (coronary artery disease)    a.  s/p CABG 2011.  Marland Kitchen CAROTID ARTERY STENOSIS 04/16/2009  . Cataract   . Chronic diastolic CHF (congestive heart failure) ([place])   . Chronic respiratory failure ([place])   . COLONIC POLYPS 05/21/2007  . COPD, severe ([place])   . Diabetes mellitus without complication ([place])   . DIVERTICULAR DISEASE 05/21/2007  . Dysrhythmia   . Essential hypertension   . Falls   . GERD (gastroesophageal reflux disease)   . GI BLEEDING 09/09/2008  . Hemoptysis   . HIATAL HERNIA 05/21/2007  . History of kidney stones   . HYPERLIPIDEMIA 11/21/2006  . Internal hemorrhoids   . MYOCARDIAL INFARCTION, HX OF 11/21/2006  . NEPHROLITHIASIS 05/21/2007  . Normocytic anemia   . NSVT (nonsustained ventricular tachycardia) ([place])    a. Holter 2015: NSR, A. Fib/flutter, short runs of  NSVT  . OSTEOPOROSIS 11/21/2006  . Osteoporosis   . Paroxysmal atrial fibrillation ([place])    a. Anticoag stopped due to falls, rectal bleeding.  . Paroxysmal atrial flutter ([place])   . Pneumonia   . Pulmonary nodule    a. Dx 01/2015 -> advised to f/u PCP for PET.  Marland Kitchen PVD (peripheral vascular disease) ([place])    a. s/p prior iliac stenting.    Patient Active Problem List   Diagnosis Date Noted  . Pulmonary nodules   . COPD with acute exacerbation ([place]) 06/10/2016  . Chest pain 02/23/2016  . Lung nodule, solitary   . HCAP (healthcare-associated pneumonia)   . Iron deficiency anemia 02/15/2015  . CAD (coronary artery disease) 09/18/2014  . Compression fracture of L1 lumbar vertebra (HCC)   . Syncope 08/23/2014  . Low back pain 08/23/2014  . Hypertension 08/23/2014  . COPD (chronic obstructive pulmonary disease) ([place]) 08/23/2014  . Chronic atrial fibrillation 08/23/2014  . Syncope and collapse   . Olecranon bursitis 07/09/2014  . Diabetes mellitus type II, controlled ([place]) 07/01/2014  . Chronic anticoagulation 05/29/2014  . Acute respiratory failure with hypoxia ([place]) 05/14/2014  . Diastolic CHF ([place]) 62/83/1517  . COPD exacerbation ([place])   . CAP (community acquired pneumonia) 04/03/2014  . Allergic rhinitis 02/06/2014  . GERD (gastroesophageal reflux disease) 02/06/2014  . Do not resuscitate 02/06/2014  . Erectile dysfunction 06/18/2010  . Atrial fibrillation ([place]) 11/06/2009  . Peripheral vascular disease (  [place]) 09/02/2009  . Carotid artery stenosis 04/16/2009  . ANEMIA, B12 DEFICIENCY 12/02/2008  . COPD GOLD III 05/21/2007  . TOBACCO USE 01/19/2007  . Hyperlipidemia 11/21/2006  . Essential hypertension 11/21/2006  . Hx of CABG 11/21/2006  . Osteoporosis 11/21/2006    Past Surgical History:  Procedure Laterality Date  . APPENDECTOMY    . CARDIOVERSION N/A 04/16/2014   Procedure: CARDIOVERSION;  Surgeon: Josue Hector, MD;  Location: [facility] ENDOSCOPY;  Service:  Cardiovascular;  Laterality: N/A;  . CHOLECYSTECTOMY    . CORONARY ARTERY BYPASS GRAFT  2011  . CORONARY ARTERY BYPASS GRAFT     x 5  . CORONARY STENT PLACEMENT     03/07/2001  . HEMORRHOID SURGERY    . INGUINAL HERNIA REPAIR  10/10/08  . MASS EXCISION Left 07/09/2014   Procedure: LEFT ELBOW BURSECTOMY EXCISION MASS;  Surgeon: Iran Planas, MD;  Location: [place];  Service: Orthopedics;  Laterality: Left;  . OLECRANON BURSECTOMY  07/09/2014  . PTCA    . THYROIDECTOMY     partial        Home Medications    Prior to Admission medications   Medication Sig Start Date End Date Taking? Authorizing Provider  acetaminophen (TYLENOL) 500 MG tablet Take 500 mg by mouth every 6 (six) hours as needed for mild pain or moderate pain.   Yes [provider]  albuterol (PROAIR HFA) 108 (90 Base) MCG/ACT inhaler Inhale 2 puffs into the lungs every 4 (four) hours as needed for wheezing or shortness of breath.    Yes [provider]  alum & mag hydroxide-simeth (MAALOX/MYLANTA) 200-200-20 MG/5ML suspension Take 15 mLs by mouth 3 (three) times daily.   Yes [provider]  atorvastatin (LIPITOR) 40 MG tablet Take 40 mg by mouth daily.   Yes [provider]  azithromycin (ZITHROMAX) 250 MG tablet Take 500 mg by mouth every Monday, Wednesday, and Friday.   Yes [provider]  Calcium Carbonate-Vit D-Min (CALTRATE 600+D PLUS) 600-400 MG-UNIT per tablet Chew 1 tablet by mouth 2 (two) times daily.    Yes [provider]  cetirizine (ZYRTEC ALLERGY) 10 MG tablet Take 1 tablet (10 mg total) by mouth every morning. 08/17/10  Yes Parrett, Tammy S, NP  diltiazem (CARDIZEM CD) 180 MG 24 hr capsule Take 180 mg by mouth daily.  01/21/16  Yes Sherren Mocha, MD  furosemide (LASIX) 20 MG tablet Take 20 mg by mouth daily.   Yes [provider]  LACTOBACILLUS PO Take 2 capsules by mouth daily.   Yes [provider]  loperamide (IMODIUM) 2 MG capsule  Take 2 mg by mouth as needed for diarrhea or loose stools.    Yes [provider]  Melatonin 3 MG TABS Take 6 mg by mouth at bedtime.   Yes [provider]  memantine (NAMENDA) 10 MG tablet Take 5-20 mg by mouth See admin instructions. Take one-half tablet for 4 weeks, then one tablet for 4 weeks, then one and one-half tablets for 4 weeks then two tablets at bedtime.   Yes [provider]  OXYGEN Inhale 2-4 L into the lungs See admin instructions. Inhale 2 L continuous during the day and 4 L at night for sleep   Yes [provider]  pantoprazole (PROTONIX) 40 MG tablet TAKE 1 TABLET BY MOUTH TWICE A DAY Patient taking differently: Take 40 mg by mouth 2 (two) times daily.  05/19/14  Yes Marin Olp, MD  potassium chloride SA (K-DUR,KLOR-CON)  20 MEQ tablet Take 20 mEq by mouth daily.   Yes [provider]  benzonatate (TESSALON) 100 MG capsule Take 1 capsule (100 mg total) by mouth 3 (three) times daily. Patient not taking: Reported on 05/10/2018 03/19/18   Lavina Hamman, MD  feeding supplement, GLUCERNA SHAKE, (GLUCERNA SHAKE) LIQD Take 237 mLs by mouth 3 (three) times daily between meals. Patient not taking: Reported on 05/10/2018 06/14/16   Molt, Bethany, DO  ipratropium-albuterol (DUONEB) 0.5-2.5 (3) MG/3ML SOLN Take 3 mLs by nebulization 4 (four) times daily. Patient not taking: Reported on 05/10/2018 03/19/18   Lavina Hamman, MD    Family History Family History  Problem Relation Age of Onset  . Arthritis Mother        rheumatoid  . Heart attack Father   . Heart disease Father   . Heart disease Brother   . Colon cancer Brother 45  . Heart disease Brother   . Stomach cancer Neg Hx     Social History Social History   Tobacco Use  . Smoking status: Former Smoker    Packs/day: 3.00    Years: 50.00    Pack years: 150.00    Types: Cigarettes    Last attempt to quit: 09/25/2009    Years since quitting: 8.6  . Smokeless tobacco: Never  Used  Substance Use Topics  . Alcohol use: Yes    Alcohol/week: 6.0 standard drinks    Types: 6 Standard drinks or equivalent per week    Comment: [place] 2-3 a night  . Drug use: No     Allergies   Ticlopidine hcl; Lorazepam; Other; and Prednisone   Review of Systems Review of Systems  Constitutional: Negative.   HENT: Positive for congestion and rhinorrhea.   Respiratory: Positive for cough, shortness of breath and wheezing. Negative for apnea, choking, chest tightness and stridor.   Cardiovascular: Negative.   Gastrointestinal: Negative.   Musculoskeletal: Negative.   Skin: Negative.   Neurological: Negative.   All other systems reviewed and are negative.    Physical Exam Updated Vital Signs BP 96/76   Pulse 97   Temp 97.9 F (36.6 C) (Oral)   Resp (!) 21   Wt 63.5 kg   SpO2 95%   BMI 24.80 kg/m   Physical Exam Vitals signs and nursing note reviewed.  Constitutional:      General: He is not in acute distress.    Appearance: He is well-developed. He is not toxic-appearing or diaphoretic.     Comments: Chronically ill-appearing.  HENT:     Head: Atraumatic.     Mouth/Throat:     Comments: Posterior oropharynx clear. Eyes:     Pupils: Pupils are equal, round, and reactive to light.  Neck:     Musculoskeletal: Normal range of motion and neck supple.     Comments: No neck stiffness or neck rigidity. Cardiovascular:     Rate and Rhythm: Normal rate. Rhythm irregular.  Pulmonary:     Effort: Pulmonary effort is normal. No respiratory distress.     Comments: Moderate diffuse expiratory wheezing bilaterally.  Able to speak in short sentences without difficulty.  Audible cough. Abdominal:     General: There is no distension.     Palpations: Abdomen is soft.     Comments: Soft, nontender without rebound or guarding.  Musculoskeletal: Normal range of motion.     Comments: No lower extremity edema.  Skin:    General: Skin is warm and dry.  Comments: No  rashes, lesions, warmth or erythema  Neurological:     Mental Status: He is alert.     Comments: Oriented to person and place, however not time.  States the year is 2010 than states 2020. Correctly identifies month and president. GCS 15. Cranial nerves II through XII are grossly intact.  5/5 strength to bilateral upper and lower extremities.  Finger-to-nose is intact bilaterally.       ED Treatments / Results  Labs (all labs ordered are listed, but only abnormal results are displayed) Labs Reviewed  CBC WITH DIFFERENTIAL/PLATELET - Abnormal; Notable for the following components:      Result Value   Platelets 148 (*)    All other components within normal limits  COMPREHENSIVE METABOLIC PANEL - Abnormal; Notable for the following components:   Glucose, Bld 189 (*)    Calcium 8.5 (*)    Total Protein 6.3 (*)    Albumin 3.2 (*)    Total Bilirubin 1.3 (*)    All other components within normal limits  URINE CULTURE  URINALYSIS, ROUTINE W REFLEX MICROSCOPIC  I-STAT TROPONIN, ED    EKG EKG Interpretation  Date/Time:  Thursday May 10 2018 09:51:32 EST Ventricular Rate:  101 PR Interval:    QRS Duration: 111 QT Interval:  347 QTC Calculation: 450 R Axis:   -97 Text Interpretation:  Atrial flutter with predominant 2:1 AV block Probable right ventricular hypertrophy Inferior infarct, old no acute st/ts compared with prior today Confirmed by Aletta Edouard [phone]) on 05/10/2018 10:01:45 AM   Radiology Dg Chest 2 View  Result Date: 05/10/2018 CLINICAL DATA:  Cough and shortness of breath. Productive cough for 6 months EXAM: CHEST - 2 VIEW COMPARISON:  03/15/2018 FINDINGS: Chronic interstitial coarsening from COPD and emphysema. No convincing focal or acute airspace opacity but there is increased asymmetric markings on the left. No edema or effusion. No pneumothorax. No cardiomegaly. Moderate hiatal hernia. CABG. Chronic midthoracic compression fracture. IMPRESSION: 1. Mild increase  in lung markings on the left since prior but no definite pneumonia. Consider short follow-up. 2. COPD. Electronically Signed   By: Monte Fantasia M.D.   On: 05/10/2018 09:57    Procedures Procedures (including critical care time)  Medications Ordered in ED Medications  albuterol (PROVENTIL) (2.5 MG/3ML) 0.083% nebulizer solution 5 mg (5 mg Nebulization Given 05/10/18 0853)  albuterol (PROVENTIL) (2.5 MG/3ML) 0.083% nebulizer solution 5 mg (5 mg Nebulization Given 05/10/18 1044)     Initial Impression / Assessment and Plan / ED Course  I have reviewed the triage vital signs and the nursing notes.  Pertinent labs & imaging results that were available during my care of the patient were reviewed by me and considered in my medical decision making (see chart for details).  78 year old male appears chronically ill presents for evaluation of shortness of breath and cough.  History of COPD. Chronic hypoxia on 2 L nasal cannula at home.  Moderate expiratory wheezing bilaterally.  Able to speak in short sentences without difficulty.  Requiring 3 L oxygen nasal cannula currently.  Patient mildly tachypneic in room.  Has had recent upper respiratory symptoms. Admitted 2/19 for COPD exacerbation. AOx3. Family states patient is at baseline mentation.  Clinical Course as of May 10 1133  Thu May 10, 4568  6165 78 year old male with history of COPD here with increased shortness of breath this morning.  He says he is taken off his oxygen to try to arrange something rather and then he felt worsened.  He says it got away from him.  He says he feels back to baseline now that he is been back on oxygen.  He has a little bit of tachypnea but does not look to be struggling.  Blood pressure little softer than baseline.  No fever.  Getting labs chest x-ray EKG troponin   [MB]    Clinical Course User Index [MB] Hayden Rasmussen, MD   [phone]: Patient with clear lung sounds, mild tachypnea.  1010: Patient with  expiratory wheeze to left upper lobe on reevalution. Chest xray negative for infiltrates, consistent with COPD. Will give additional duoneb and reevaluate.  1130: Additional duoneb given. Will trial ambulation. Patient with oxygen desaturation to 85% and extremely tachypneic and SOB with ambulation.  Will consult hospitalist for admission for COPD exacerbation.  1200: Consulted with Dr. Grandville Silos hospital teaching service who agrees for admission.  Final Clinical Impressions(s) / ED Diagnoses   Final diagnoses:  COPD (chronic obstructive pulmonary disease) [facility])    ED Discharge Orders    None       Rafaela Dinius A, PA-C 05/10/18 1736    Hayden Rasmussen, MD 05/10/18 [phone]

## 2018-05-10 NOTE — Progress Notes (Signed)
Dr. Grandville Silos paged the following at 1745.   "[id number]:Poet.CBG 431. Insulin orders say ">400 call MD and obtain STAT lab verification". Lab order placed. additional insulin orders?"

## 2018-05-11 ENCOUNTER — Observation Stay (HOSPITAL_COMMUNITY): Payer: Medicare Other

## 2018-05-11 DIAGNOSIS — R0602 Shortness of breath: Secondary | ICD-10-CM | POA: Diagnosis present

## 2018-05-11 DIAGNOSIS — J9621 Acute and chronic respiratory failure with hypoxia: Secondary | ICD-10-CM | POA: Diagnosis not present

## 2018-05-11 DIAGNOSIS — R443 Hallucinations, unspecified: Secondary | ICD-10-CM | POA: Diagnosis not present

## 2018-05-11 DIAGNOSIS — E860 Dehydration: Secondary | ICD-10-CM | POA: Diagnosis present

## 2018-05-11 DIAGNOSIS — J449 Chronic obstructive pulmonary disease, unspecified: Secondary | ICD-10-CM | POA: Diagnosis present

## 2018-05-11 DIAGNOSIS — I2510 Atherosclerotic heart disease of native coronary artery without angina pectoris: Secondary | ICD-10-CM | POA: Diagnosis present

## 2018-05-11 DIAGNOSIS — E785 Hyperlipidemia, unspecified: Secondary | ICD-10-CM | POA: Diagnosis present

## 2018-05-11 DIAGNOSIS — J9612 Chronic respiratory failure with hypercapnia: Secondary | ICD-10-CM | POA: Diagnosis present

## 2018-05-11 DIAGNOSIS — I110 Hypertensive heart disease with heart failure: Secondary | ICD-10-CM | POA: Diagnosis present

## 2018-05-11 DIAGNOSIS — I472 Ventricular tachycardia: Secondary | ICD-10-CM | POA: Diagnosis not present

## 2018-05-11 DIAGNOSIS — J181 Lobar pneumonia, unspecified organism: Secondary | ICD-10-CM | POA: Diagnosis not present

## 2018-05-11 DIAGNOSIS — Z66 Do not resuscitate: Secondary | ICD-10-CM | POA: Diagnosis present

## 2018-05-11 DIAGNOSIS — I482 Chronic atrial fibrillation, unspecified: Secondary | ICD-10-CM | POA: Diagnosis present

## 2018-05-11 DIAGNOSIS — Z87891 Personal history of nicotine dependence: Secondary | ICD-10-CM | POA: Diagnosis not present

## 2018-05-11 DIAGNOSIS — J189 Pneumonia, unspecified organism: Secondary | ICD-10-CM | POA: Diagnosis present

## 2018-05-11 DIAGNOSIS — I480 Paroxysmal atrial fibrillation: Secondary | ICD-10-CM | POA: Diagnosis present

## 2018-05-11 DIAGNOSIS — E890 Postprocedural hypothyroidism: Secondary | ICD-10-CM | POA: Diagnosis present

## 2018-05-11 DIAGNOSIS — R3129 Other microscopic hematuria: Secondary | ICD-10-CM | POA: Diagnosis present

## 2018-05-11 DIAGNOSIS — F0390 Unspecified dementia without behavioral disturbance: Secondary | ICD-10-CM | POA: Diagnosis present

## 2018-05-11 DIAGNOSIS — J440 Chronic obstructive pulmonary disease with acute lower respiratory infection: Secondary | ICD-10-CM | POA: Diagnosis present

## 2018-05-11 DIAGNOSIS — I5032 Chronic diastolic (congestive) heart failure: Secondary | ICD-10-CM | POA: Diagnosis present

## 2018-05-11 DIAGNOSIS — R0600 Dyspnea, unspecified: Secondary | ICD-10-CM | POA: Diagnosis present

## 2018-05-11 DIAGNOSIS — J441 Chronic obstructive pulmonary disease with (acute) exacerbation: Secondary | ICD-10-CM | POA: Diagnosis present

## 2018-05-11 DIAGNOSIS — N179 Acute kidney failure, unspecified: Secondary | ICD-10-CM | POA: Diagnosis present

## 2018-05-11 DIAGNOSIS — E1151 Type 2 diabetes mellitus with diabetic peripheral angiopathy without gangrene: Secondary | ICD-10-CM | POA: Diagnosis present

## 2018-05-11 DIAGNOSIS — R410 Disorientation, unspecified: Secondary | ICD-10-CM | POA: Diagnosis not present

## 2018-05-11 DIAGNOSIS — K219 Gastro-esophageal reflux disease without esophagitis: Secondary | ICD-10-CM | POA: Diagnosis present

## 2018-05-11 DIAGNOSIS — J9611 Chronic respiratory failure with hypoxia: Secondary | ICD-10-CM | POA: Diagnosis present

## 2018-05-11 DIAGNOSIS — M810 Age-related osteoporosis without current pathological fracture: Secondary | ICD-10-CM | POA: Diagnosis present

## 2018-05-11 DIAGNOSIS — I714 Abdominal aortic aneurysm, without rupture: Secondary | ICD-10-CM | POA: Diagnosis present

## 2018-05-11 DIAGNOSIS — I4819 Other persistent atrial fibrillation: Secondary | ICD-10-CM | POA: Diagnosis not present

## 2018-05-11 LAB — CBC
HCT: 43.6 % (ref 39.0–52.0)
Hemoglobin: 13.8 g/dL (ref 13.0–17.0)
MCH: 31.4 pg (ref 26.0–34.0)
MCHC: 31.7 g/dL (ref 30.0–36.0)
MCV: 99.3 fL (ref 80.0–100.0)
NRBC: 0 % (ref 0.0–0.2)
Platelets: 180 10*3/uL (ref 150–400)
RBC: 4.39 MIL/uL (ref 4.22–5.81)
RDW: 12.8 % (ref 11.5–15.5)
WBC: 9 10*3/uL (ref 4.0–10.5)

## 2018-05-11 LAB — TROPONIN I: Troponin I: <0.03 ng/mL (ref <0.03)

## 2018-05-11 LAB — BASIC METABOLIC PANEL
Anion gap: 7 (ref 5–15)
BUN: 17 mg/dL (ref 8–23)
CO2: 28 mmol/L (ref 22–32)
CREATININE: 0.9 mg/dL (ref 0.61–1.24)
Calcium: 9.1 mg/dL (ref 8.9–10.3)
Chloride: 106 mmol/L (ref 98–111)
GFR calc non Af Amer: >60 mL/min (ref >60)
Glucose, Bld: 209 mg/dL — ABNORMAL HIGH (ref 70–99)
Potassium: 4.4 mmol/L (ref 3.5–5.1)
Sodium: 141 mmol/L (ref 135–145)

## 2018-05-11 LAB — HEMOGLOBIN A1C
Hgb A1c MFr Bld: 7.6 % — ABNORMAL HIGH (ref 4.8–5.6)
Mean Plasma Glucose: 171 mg/dL

## 2018-05-11 LAB — URINE CULTURE: Culture: NO GROWTH

## 2018-05-11 LAB — GLUCOSE, CAPILLARY
GLUCOSE-CAPILLARY: 320 mg/dL — AB (ref 70–99)
Glucose-Capillary: 213 mg/dL — ABNORMAL HIGH (ref 70–99)
Glucose-Capillary: 292 mg/dL — ABNORMAL HIGH (ref 70–99)
Glucose-Capillary: 311 mg/dL — ABNORMAL HIGH (ref 70–99)

## 2018-05-11 LAB — HIV ANTIBODY (ROUTINE TESTING W REFLEX): HIV Screen 4th Generation wRfx: NONREACTIVE

## 2018-05-11 MED ORDER — INSULIN GLARGINE 100 UNIT/ML ~~LOC~~ SOLN
5.0000 [IU] | Freq: Every day | SUBCUTANEOUS | Status: DC
  Administered 2018-05-11: 5 [IU] via SUBCUTANEOUS
  Filled 2018-05-11: qty 0.05

## 2018-05-11 MED ORDER — IPRATROPIUM-ALBUTEROL 0.5-2.5 (3) MG/3ML IN SOLN
3.0000 mL | Freq: Once | RESPIRATORY_TRACT | Status: AC
  Administered 2018-05-11: 3 mL via RESPIRATORY_TRACT
  Filled 2018-05-11: qty 3

## 2018-05-11 MED ORDER — ALBUTEROL SULFATE (2.5 MG/3ML) 0.083% IN NEBU
2.5000 mg | INHALATION_SOLUTION | RESPIRATORY_TRACT | Status: DC
  Administered 2018-05-11: 2.5 mg via RESPIRATORY_TRACT
  Filled 2018-05-11: qty 3

## 2018-05-11 MED ORDER — ALBUTEROL SULFATE (2.5 MG/3ML) 0.083% IN NEBU
2.5000 mg | INHALATION_SOLUTION | RESPIRATORY_TRACT | Status: DC | PRN
  Administered 2018-05-12 – 2018-05-13 (×5): 2.5 mg via RESPIRATORY_TRACT
  Filled 2018-05-11 (×4): qty 3

## 2018-05-11 MED ORDER — CEFDINIR 300 MG PO CAPS
300.0000 mg | ORAL_CAPSULE | Freq: Two times a day (BID) | ORAL | Status: DC
  Administered 2018-05-11 – 2018-05-14 (×7): 300 mg via ORAL
  Filled 2018-05-11 (×7): qty 1

## 2018-05-11 MED ORDER — GUAIFENESIN-DM 100-10 MG/5ML PO SYRP: 5.0000 mL | ORAL_SOLUTION | ORAL | Status: DC | PRN

## 2018-05-11 MED ORDER — ALBUTEROL SULFATE (2.5 MG/3ML) 0.083% IN NEBU
2.5000 mg | INHALATION_SOLUTION | Freq: Four times a day (QID) | RESPIRATORY_TRACT | Status: DC
  Administered 2018-05-11 (×3): 2.5 mg via RESPIRATORY_TRACT
  Filled 2018-05-11 (×5): qty 3

## 2018-05-11 NOTE — Discharge Summary (Signed)
[name]  Patient name: [name] Medical record number: [id number] Date of birth: 1940/09/14 Age: 78 y.o. Gender: male Date of Admission: 05/10/2018  Date of Discharge: 05/14/2018  Admitting Physician: Blane Ohara McDiarmid, MD  Primary Care Provider: Reubin Milan, MD Consultants: [facility]  Indication for Hospitalization: COPD exacerbation with Community Acquired Pneumonia  Discharge Diagnoses/Problem List:  Patient Active Problem List   Diagnosis Date Noted  . Pulmonary hypertension ([place]) 05/13/2018  . Acute delirium   . Shortness of breath 05/11/2018  . Acute kidney injury ([place]) 05/10/2018  . Hematuria, microscopic 05/10/2018  . Dehydration 05/10/2018  . Abdominal aortic aneurysm ([place]) 05/10/2018  . Lung nodule, solitary   . Iron deficiency anemia 02/15/2015  . COPD (chronic obstructive pulmonary disease) ([place]) 08/23/2014  . Chronic atrial fibrillation 08/23/2014  . Diabetes mellitus type II, controlled ([place]) 07/01/2014  . Acute on chronic respiratory failure ([place]) 05/14/2014  . COPD exacerbation ([place])   . Chronic respiratory failure with hypercapnia ([place]) 05/10/2014  . CAP (community acquired pneumonia) 04/03/2014  . Allergic rhinitis 02/06/2014  . GERD (gastroesophageal reflux disease) 02/06/2014  . Do not resuscitate 02/06/2014  . Erectile dysfunction 06/18/2010  . Atrial fibrillation ([place]) 11/06/2009  . Peripheral vascular disease ([place]) 09/02/2009  . Carotid artery stenosis 04/16/2009  . ANEMIA, B12 DEFICIENCY 12/02/2008  . TOBACCO USE 01/19/2007  . Hyperlipidemia 11/21/2006  . Essential hypertension 11/21/2006  . Hx of CABG 11/21/2006  . Osteoporosis 11/21/2006   Disposition: SNF  Discharge Condition: Stable  Discharge Exam:  General: elderly appearing, frail, NAD Cardiovascular: irregularly irregular Respiratory: CTAB, no wheezing, no increased work of breathing, 2L [place]  Abdomen: Soft, nontender Extremities:  No peripheral edema Psych: alert and oriented x 2  Brief Hospital Course:  [name] is a 78 y.o. male who presented with increased work of breathing and shortness of breath from home. He desaturated to mid80s on home O2.  Patient was treated for COPD exacerbation with 5-day course of steroids.  He was also found to have lucency on chest x-ray that was concerning for pneumonia.  Repeat chest x-ray showed growing possible infiltrate.  He was treated with 5 days of azithromycin and cephalosporin (2 days of ceftriaxone and 3 days of cefdinir).  Patient was started on Anoro Ellipta for controller medication at home. Patient was evaluated by physical therapy and Occupational Therapy.  They recommended 24-hour supervision.  Patient's primary caregiver, his son Reed, Dady., is currently in the hospital with back surgery.  Patient does not have 24-hour supervision and therefore needs SNF placement.  Patient was discharged to SNF.  Upon discharge patient was eating and drinking normally and on his home 2 L of nasal cannula oxygen with normal saturations for COPD.  During his hospitalization, patient experienced some mild delirium and hallucinations.  These were typically docile.  Per family this was at baseline.  Continue patient's regular Namenda for this.  Issues for Follow Up:  1. Monitor AAA with abdominal aortic US.  If difficult transportation then get [facility] [place] inhouse, if not, then schedule as outpatient.  2. Repeat urine microscopy in month, if persists then referral to Urology to R/O malignancy  Significant Procedures: None  Significant Labs and Imaging:  Recent Labs  Lab 05/10/18 0858 05/10/18 1547 05/11/18 0219  WBC 7.8 4.3 9.0  HGB 14.4 14.3 13.8  HCT 45.4 43.6 43.6  PLT 148* 164 180   Recent Labs  Lab 05/10/18 0858 05/10/18 1547 05/10/18 1845  05/11/18 0219 05/13/18 0954  NA 139  --   --  141 138  K 3.8  --   --  4.4 4.1  CL 100  --   --  106 95*  CO2 25  --   --  28 33*   GLUCOSE 189*  --  425* 209* 174*  BUN 8  --   --  17 16  CREATININE 0.90 1.29*  --  0.90 0.95  CALCIUM 8.5*  --   --  9.1 9.5  MG  --   --   --   --  2.1  ALKPHOS 47  --   --   --   --   AST 15  --   --   --   --   ALT 11  --   --   --   --   ALBUMIN 3.2*  --   --   --   --     Results/Tests Pending at Time of Discharge:   Discharge Medications:  Allergies as of 05/14/2018      Reactions   Ticlopidine Hcl Swelling   Lorazepam Other (See Comments)   Altered mental status from 2 doses of IV Ativan on 05/10/14 HALLUCINATIONS AND DELIRIOUS   Other Other (See Comments)   Narcotics: Hallucinations and paranoia   Prednisone Other (See Comments)   NOT TRUE ALLERGY - CONFUSION      Medication List    STOP taking these medications   acetaminophen 500 MG tablet Commonly known as:  TYLENOL   azithromycin 250 MG tablet Commonly known as:  ZITHROMAX     TAKE these medications   alum & mag hydroxide-simeth 200-200-20 MG/5ML suspension Commonly known as:  MAALOX/MYLANTA Take 15 mLs by mouth 3 (three) times daily.   atorvastatin 40 MG tablet Commonly known as:  LIPITOR Take 40 mg by mouth daily.   benzonatate 100 MG capsule Commonly known as:  TESSALON Take 1 capsule (100 mg total) by mouth 3 (three) times daily.   CALTRATE 600+D PLUS 600-400 MG-UNIT per tablet Chew 1 tablet by mouth 2 (two) times daily.   cefdinir 300 MG capsule Commonly known as:  OMNICEF Take 1 capsule (300 mg total) by mouth every 12 (twelve) hours.   diltiazem 180 MG 24 hr capsule Commonly known as:  CARDIZEM CD Take 180 mg by mouth daily.   feeding supplement (GLUCERNA SHAKE) Liqd Take 237 mLs by mouth 3 (three) times daily between meals.   furosemide 20 MG tablet Commonly known as:  LASIX Take 20 mg by mouth daily.   Influenza vac split quadrivalent PF 0.5 ML injection Commonly known as:  FLUZONE HIGH-DOSE Inject 0.5 mLs into the muscle tomorrow at 10 am for 1 dose.   ipratropium-albuterol  0.5-2.5 (3) MG/3ML Soln Commonly known as:  DUONEB Take 3 mLs by nebulization 4 (four) times daily.   LACTOBACILLUS PO Take 2 capsules by mouth daily.   loperamide 2 MG capsule Commonly known as:  IMODIUM Take 2 mg by mouth as needed for diarrhea or loose stools.   Melatonin 3 MG Tabs Take 6 mg by mouth at bedtime.   memantine 10 MG tablet Commonly known as:  NAMENDA Take 5-20 mg by mouth See admin instructions. Take one-half tablet for 4 weeks, then one tablet for 4 weeks, then one and one-half tablets for 4 weeks then two tablets at bedtime.   OXYGEN Inhale 2-4 L into the lungs See admin instructions. Inhale 2 L continuous during  the day and 4 L at night for sleep   pantoprazole 40 MG tablet Commonly known as:  PROTONIX TAKE 1 TABLET BY MOUTH TWICE A DAY   potassium chloride SA 20 MEQ tablet Commonly known as:  K-DUR,KLOR-CON Take 20 mEq by mouth daily.   PROAIR HFA 108 (90 Base) MCG/ACT inhaler Generic drug:  albuterol Inhale 2 puffs into the lungs every 4 (four) hours as needed for wheezing or shortness of breath.   umeclidinium-vilanterol 62.5-25 MCG/INH Aepb Commonly known as:  ANORO ELLIPTA Inhale 1 puff into the lungs daily. Start taking on:  May 15, 2018   ZYRTEC ALLERGY 10 MG tablet Generic drug:  cetirizine Take 1 tablet (10 mg total) by mouth every morning.       Discharge Instructions: Please refer to Patient Instructions section of EMR for full details.  Patient was counseled important signs and symptoms that should prompt return to medical care, changes in medications, dietary instructions, activity restrictions, and follow up appointments.   Follow-Up Appointments:  Contact information for follow-up providers    Mulles, Junious Dresser, MD Follow up in 1 week(s).   Specialty:  Internal Medicine Contact information: [place] [address] [phone]            Contact information for after-discharge care    Destination     [facility]erred [facility] .   Service:  Skilled Nursing Contact information: [address] [place]                  Bonnita Hollow, MD 05/14/2018, 1:53 PM PGY-2, [place]

## 2018-05-11 NOTE — Progress Notes (Addendum)
FPTS Interim Progress Note  S:Paged by nurse at 0315 concerning worsening SOB by patient. Received Albuterol at 0100 but still SOB. Saw patient. Denied any fever or chills, chest pain. Did endorse palpitations. Also endorsed some nausea. Has been coughing up more "green, red tinged phlegm".  Vitals while in Room: BP: 152/79, P: 86, T: 97.6, O2: 94% on 2L   O: BP 133/78   Pulse 76   Temp (!) 97.5 F (36.4 C) (Oral)   Resp 20   Ht 5\' 3"  (1.6 m)   Wt 62.4 kg   SpO2 97%   BMI 24.37 kg/m   General: elderly gentlemen lying in bed, speaking in full sentences although does pause to catch breath occasionally with longer sentences, non-toxic appearance  CV: regular rate but difficult to appreciate rhythm on CV exam Lungs: audible wheezes from bedside, scattered wheezes but overall very decreased breath sounds throughout, R>L, crackles appreciated in lower lobes bilaterally R>L as well. Mildly increased work of breathing Extremities: warm and well perfused, normal tone, no LE edema  Neuro: alert and oriented x3   A/P: SOB:  Patient currently stable with non-toxic appearance. SOB appears likely 2/2 to current COPD exacerbation with decreased breath sounds throughout and scattered wheezes. Some crackles in lower lobes concerning for edema vs PNA although afebrile and currently on antibiotics. Procalcintonin <0.10. BNP only slightly elevated to 195 on admission. Currently on home lasix and does not appear volume up on exam. Patient initially endorsed nausea but by end of exam no longer was nauseated. Denies any chest pain. A-fib noted on tele, but currently rate controlled. Well's Score 0-1 given "red tinged sputum", however feel PE is unlikely at this time. Currently on Lovenox. At this time will give another breathing treatment and follow up on imaging/EKG and symptoms s/p duoneb. Will hold off on stat CXR at this time and follow up s/p breathing treatment.  - duoneb x 1 - Continue Albuterol q4 hours,  prednisone, CTX/Azithro - repeat CXR in AM - repeat EKG - consider ABG, repeat Echo     3888: Reexamined patient s/p breathing treatment. Patient is sleeping soundly. Repeat EKG reviewed. Significant for A-fib with HR 83, no ST changes or significant changes appreciated. Lung exam with Improved air movement throughout. Patient breathing comfortably without increase work of breathing. No audible wheezes appreciated at bed side. At this time, will continue to monitor.  - f/u repeat CXR in AM - Continue Albuterol q4 hours, prednisone, CTX/Azithro   Danna Hefty, DO 05/11/2018, 3:51 AM PGY-1, [place] Service pager [phone]

## 2018-05-11 NOTE — Progress Notes (Signed)
Nutrition Consult/Brief Note  RD consulted via Inpatient COPD Exacerbation Protocol.  Wt Readings from Last 15 Encounters:  05/10/18 62.4 kg  03/15/18 63.5 kg  08/06/17 64.5 kg  06/13/16 65.8 kg  02/25/16 69.5 kg  01/21/16 69.2 kg  07/16/15 68.5 kg  03/31/15 68.4 kg  02/18/15 64.2 kg  09/18/14 59.9 kg  08/29/14 74.4 kg  08/19/14 68.5 kg  08/07/14 69.9 kg  07/09/14 68.5 kg  06/30/14 68.5 kg   Body mass index is 24.37 kg/m. Patient meets criteria for Normal based on current BMI.   Current diet order is HH/Carbohydrate Modified, patient is consuming approximately 100% of meals at this time. Labs and medications reviewed.   No nutrition interventions warranted at this time. If nutrition issues arise, please consult RD.   Arthur Holms, RD, LDN Pager #: [phone] After-Hours Pager #: [phone]

## 2018-05-11 NOTE — Progress Notes (Signed)
Spoke with son [name] re: discharge planning. He is concerned about patient safety going home with home health. He states patient is currently having visual hallucinations. He would like to know if patient can go to SNF for rehab. Patient's other son is HCPOA is inpatient and [name] does not want to burden him with decision making. He will not be available to help with care/supervision after discharge. Contacted Kathlee Nations with Social Work and also relayed a message through [facility] resident on unit.

## 2018-05-11 NOTE — Consult Note (Signed)
   [facility] Inpatient Consult   05/11/2018  RAZIEL KOENIGS April 23, 1940 [id number]    Received call from inpatient RNCM to inquire whether patient is eligible for [facility] First program. Noted Mr. Kerth does not have a [facility] Provider. Made inpatient RNCM aware.   Marthenia Rolling, MSN-Ed, RN,BSN [facility] Liaison [phone]

## 2018-05-11 NOTE — Progress Notes (Addendum)
Family Medicine Teaching Service Daily Progress Note Intern Pager: [phone]  Patient name: [name] Medical record number: [id number] Date of birth: [birth year] Age: 78 y.o. Gender: male  Primary Care Provider: Reubin Milan, MD Consultants: None Code Status: DNR/DNI  Pt Overview and Major Events to Date:  [name] is a 78 y.o. male presenting with SOB . PMH is significant for COPD (on 2L [place]), CAD s/p CABG x5, HTN, HLD, CHF, and paroxysmal a-fib (not on anticoagulation due to fall risk), and dementia.  Assessment and Plan:  #Dyspnea secondary to COPD exacerbation w/ possible community acquired pneumonia:  Improving with episode of worsening dyspnea last night that improved with breathing tx. Wheezing much improved on exam today. On home 2L O2 [place]. Class IV COPD. Repeat 2-view CXR this AM shows progressive left-sided posterior opacity. Cannot rule out that dyspnea is related to arrhythmia/CHF. Repeat EKG this AM shows a-fib, RBBB and no ST changes. Bibasilar crackles were heard overnight during dyspneic episode, but not present on exam this AM. Pt does not appear fluid overloaded, without peripheral edema. Plan to treat for PNA and COPD exacerbation and continue home regimen for a-fib and HFrEF as below. - S/p ceftriaxone 1 g IV, plan to transition to oral cefdinir today - Anoro Ellipta daily - Albuterol nebulizer q6h - Start azithromycin 500 mg daily x5 days - Start prednisone 40 mg daily x5 days - Supplemental oxygen to keep pt above 89% with exertion, if possible. - Ambulate patient on dose oxygen supplement on which he will be going home. - Telemetry  #Paroxysmal A-fib In atrial fibrillation on initial EKG in the ED. Not on anticoagulation due to bleeding/falls risk. Repeat EKG this AM with a-fib and RBBB. - Continue home diltiazem 180 mg - Telemetry  #HFrEF:  Last echo 08/06/17 with EF 60-65%, mild LVH, mild AS, moderately thickened mitral valve, moderately dilated LA, mildly  dialted RA. Patient without rhonchi or crackles on exam this morning but had worsening SOB overnight where bibasilar crackles were heard. No peripheral edema. I-STAT trop <0.03 x3. BNP 195.9 most recently 158 (5/19) but has had BNP 403 (2016). - Continue home Lasix 20 mg  #T2DM: A1C 7.6 (05/10/18). Hx of T2DM on insulin but currently without medications for diabetes on his med list. BGs in the 400s yesterday after dose of prednisone. Received 11 U of novolog. Will likely need to start on basal insulin in the acute setting given steroid use, but will re-evaluate on discharge. - Start Lantus 5 U - q4h CBGs - sSSI  #Hematuria, microscopic: Recurrent on urine microscopy 06/10/16 and 05/10/18. - F/U urine culture - Repeat urine microscopy in month, if persists then referral to Urology to R/O malignancy  #Abdominal Aortic Aneurysm  AAA 3.4 cm with intra-lumen thrombus on CT 2017. - Monitor AAA with abdominal aortic US.  If difficult transportation then get AAA [place] inhouse, if not, then schedule as outpatient.   #Dementia/delirium: At baseline on admission per family. Reports of hallucinations by staff and family throughout the day today, which is new, concerning for delirium.  - Continue home memantine 10 mg - Can discuss with family possibly discontinuing in the outpatient setting. - Delirium precautions  #Hyperlipidemia: - Continue home atorvastatin 40 mg -Could consider discontinuing as may not provide as much benefit in this age range  #GERD: - Continue home Protonix and Mylanta - Mylanta  #Seasonal allergies: - Continue home claritin  FEN/GI: Heart healthy PPx: Lovenox  Disposition: telemetry, plan for home with  home health  Subjective:  [name] had episode of worsening SOB overnight around 0300. See significant event note. This morning he denies SOB, has no complaints. He is alert and oriented to person and place but not time, and forgot date again after he was corrected. He  is enjoying looking out the window but thought he saw a man with an apron cooking outside, which was not seen by the interviewer.  Objective: Temp:  [97.1 F (36.2 C)-98.1 F (36.7 C)] 97.5 F (36.4 C) (02/14 0748) Pulse Rate:  [76-104] 83 (02/14 0748) Resp:  [18-28] 18 (02/14 0504) BP: (88-142)/(62-82) 142/70 (02/14 0748) SpO2:  [92 %-98 %] 92 % (02/14 0748) FiO2 (%):  [28 %] 28 % (02/13 1516) Weight:  [62.4 kg] 62.4 kg (02/13 1500)   Physical Exam: General: Well-appearing, NAD, on home 2L O2 [place] Cardiovascular: A-fib Respiratory: Mild wheezing throughout with reduced air movement, no crackles or rhonchi. Normal WOB. Abdomen: Soft, nontender Extremities: No peripheral edema Psych: A&Ox2, not oriented to time  Laboratory: Recent Labs  Lab 05/10/18 0858 05/10/18 1547 05/11/18 0219  WBC 7.8 4.3 9.0  HGB 14.4 14.3 13.8  HCT 45.4 43.6 43.6  PLT 148* 164 180   Recent Labs  Lab 05/10/18 0858 05/10/18 1547 05/10/18 1845 05/11/18 0219  NA 139  --   --  141  K 3.8  --   --  4.4  CL 100  --   --  106  CO2 25  --   --  28  BUN 8  --   --  17  CREATININE 0.90 1.29*  --  0.90  CALCIUM 8.5*  --   --  9.1  PROT 6.3*  --   --   --   BILITOT 1.3*  --   --   --   ALKPHOS 47  --   --   --   ALT 11  --   --   --   AST 15  --   --   --   GLUCOSE 189*  --  425* 209*      Imaging/Diagnostic Tests: Dg Chest 2 View  Result Date: 05/11/2018 CLINICAL DATA:  Initial evaluation for increased shortness of breath, history of COPD. EXAM: CHEST - 2 VIEW COMPARISON:  Prior radiograph from 05/10/2018. FINDINGS: Median sternotomy wires underlying CABG markers and surgical clips, stable. Cardiomegaly unchanged. Mediastinal silhouette normal. Aortic atherosclerosis. Hiatal hernia. Lungs somewhat hyperinflated with changes compatible COPD. Increased opacity within the mid left lung, concerning for possible superimposed infiltrate. No other new focal airspace disease. No pulmonary edema or definite  pleural effusion. No pneumothorax. Osteopenia. Midthoracic compression deformity noted, stable. No acute osseous finding. IMPRESSION: 1. Progressive opacity at the mid left lung, concerning for developing infiltrate. 2. Underlying COPD. Electronically Signed   By: Jeannine Boga M.D.   On: 05/11/2018 04:56   Dg Chest 2 View  Result Date: 05/10/2018 CLINICAL DATA:  Cough and shortness of breath. Productive cough for 6 months EXAM: CHEST - 2 VIEW COMPARISON:  03/15/2018 FINDINGS: Chronic interstitial coarsening from COPD and emphysema. No convincing focal or acute airspace opacity but there is increased asymmetric markings on the left. No edema or effusion. No pneumothorax. No cardiomegaly. Moderate hiatal hernia. CABG. Chronic midthoracic compression fracture. IMPRESSION: 1. Mild increase in lung markings on the left since prior but no definite pneumonia. Consider short follow-up. 2. COPD. Electronically Signed   By: Monte Fantasia M.D.   On: 05/10/2018 09:57  I have seen and evaluated the patient with medical student Minish. I am in agreement with the note above in its revised form. My additions are in blue.  Marjie Skiff, MD Family Medicine, PGY-3  Mitchell Heir, Medical Student 05/11/2018, 8:54 AM [place] Intern pager: [phone], text pages welcome

## 2018-05-11 NOTE — Progress Notes (Signed)
Inpatient Diabetes Program Recommendations  AACE/ADA: New Consensus Statement on Inpatient Glycemic Control (2015)  Target Ranges:  Prepandial:   less than 140 mg/dL      Peak postprandial:   less than 180 mg/dL (1-2 hours)      Critically ill patients:  140 - 180 mg/dL   Results for [name], [name] (MRN [id number]) as of 05/11/2018 10:48  Ref. Range 05/10/2018 17:21 05/10/2018 20:45 05/11/2018 07:49  Glucose-Capillary Latest Ref Range: 70 - 99 mg/dL 431 (H)  9 units NOVOLOG  229 (H)  2 units NOVOLOG  213 (H)  3 units NOVOLOG    Results for [name], [name] (MRN [id number]) as of 05/11/2018 10:48  Ref. Range 05/10/2018 15:47  Hemoglobin A1C Latest Ref Range: 4.8 - 5.6 % 7.6 (H)    Admit with: COPD/ CAP  History: DM, COPD, Dementia  Home DM Meds: None listed  Current Orders: Novolog Sensitive Correction Scale/ SSI (0-9 units) TID AC + HS     Getting Prednisone 40 mg Daily.    MD- Per Chart Review, patient was taking both Levemir and Humalog insulins back in May of 2019.  Not sure when Insulin was d/c'd from home meds?  See H&P note from Dr. Hal Hope 08/04/2017.  Glucose 425 mg/dl last night at 6:45pm.  Note Novolog SSI started.  May want to try low dose basal insulin for patient in-hospital if fasting glucose continues to stay >200 mg/dl.  Could try Lantus 6 units QHS to start (0.1 units/kg dosing based on weight of 62 kg)     --Will follow patient during hospitalization--  Wyn Quaker RN, MSN, CDE Diabetes Coordinator Inpatient Glycemic Control Team Team Pager: [phone] (8a-5p)

## 2018-05-11 NOTE — Progress Notes (Addendum)
Patient is not a Home First candidate per [facility], CSW aware.

## 2018-05-11 NOTE — Progress Notes (Signed)
Occupational Therapy Treatment Patient Details Name: [name] MRN: [id number] DOB: 1940/05/01 Today's Date: 05/11/2018    History of present illness Pt presenting to hospital with SOB secondary to COPD exacerbation. Patient has had multiple hospitalizations for same cause. PMH of COPD, chronic hypoxic respiratory failure on 2 LPM, CAD S/P CABG, HTN, HLD, chronic diastolic CHF, PAF;    OT comments  Upon arrival pt stated he was feeling better, but was feeling very lonely this date. Pt currently requires minguard for ADL and functional mobility. Pt demonstrated decrease activity tolerance with subtle shaking in BUE after functional mobility stating he was tired from PT session earlier this date. Pt highly motivated requesting OT return later so he can engage more during session. Told pt OT will return if time allows. Pt will continue to benefit from skilled OT services to allow safe d/c home. Will continue to follow acutely.   SpO2 88%-95% throughout session, pursed lip breathing to return SpO2 levels >90%.    Follow Up Recommendations  Home health OT;Supervision/Assistance - 24 hour    Equipment Recommendations  None recommended by OT    Recommendations for Other Services PT consult    Precautions / Restrictions Precautions Precautions: Fall;Other (comment) Restrictions Weight Bearing Restrictions: No       Mobility Bed Mobility Overal bed mobility: Needs Assistance Bed Mobility: Supine to Sit;Sit to Supine     Supine to sit: Supervision Sit to supine: Supervision   General bed mobility comments: pt received in recliner  Transfers Overall transfer level: Needs assistance Equipment used: Rolling walker (2 wheeled) Transfers: Sit to/from Stand Sit to Stand: Supervision         General transfer comment: S for safety and 1 vc for safe hand placement    Balance Overall balance assessment: Needs assistance Sitting-balance support: No upper extremity supported;Feet  supported Sitting balance-Leahy Scale: Good     Standing balance support: Bilateral upper extremity supported Standing balance-Leahy Scale: Poor Standing balance comment: reliant on BUE support and use of RW to maintain standing balance                           ADL either performed or assessed with clinical judgement   ADL Overall ADL's : Needs assistance/impaired Eating/Feeding: Sitting;Supervision/ safety Eating/Feeding Details (indicate cue type and reason): pt ate ice cream, okayed by nsg, able to open the containers with increased effort                     Toilet Transfer: Min guard;Cueing for safety;Ambulation Toilet Transfer Details (indicate cue type and reason): simulated transfer from EOB in room mobiltiy with return to EOB, pt required vc for sare hand placement, minguard for stability and use of DME         Functional mobility during ADLs: Min guard;Rolling walker General ADL Comments: minguard for stability and to monitor SpO2 levels;pt required multiple vc for pursed lip breathing during ADL, SpO2 88%-95% during ADL     Vision       Perception     Praxis      Cognition Arousal/Alertness: Awake/alert Behavior During Therapy: WFL for tasks assessed/performed Overall Cognitive Status: No family/caregiver present to determine baseline cognitive functioning Area of Impairment: Problem solving;Safety/judgement;Memory                     Memory: Decreased short-term memory   Safety/Judgement: Decreased awareness of safety;Decreased awareness of deficits  Problem Solving: Difficulty sequencing;Slow processing General Comments: Patient with hx of dementia at baseline. Patient sitting EOB upon arrival confused about equipment in room.         Exercises     Shoulder Instructions       General Comments pt on 2lnc throughout session    Pertinent Vitals/ Pain       Pain Assessment: No/denies pain  Home Living                                           Prior Functioning/Environment              Frequency  Min 2X/week        Progress Toward Goals  OT Goals(current goals can now be found in the care plan section)  Progress towards OT goals: Progressing toward goals  Acute Rehab OT Goals Patient Stated Goal: to socialize with people  OT Goal Formulation: With patient Time For Goal Achievement: 05/24/18 Potential to Achieve Goals: Good ADL Goals Pt Will Perform Grooming: with modified independence;standing Pt Will Perform Lower Body Dressing: with modified independence;sit to/from stand Pt Will Transfer to Toilet: with modified independence;ambulating Additional ADL Goal #1: Pt/caregiver will demonstrate understanding/use of 3 energy conservation strategies during ADL.  Plan Discharge plan remains appropriate    Co-evaluation                 AM-PAC OT "6 Clicks" Daily Activity     Outcome Measure   Help from another person eating meals?: None Help from another person taking care of personal grooming?: A Little Help from another person toileting, which includes using toliet, bedpan, or urinal?: A Little Help from another person bathing (including washing, rinsing, drying)?: A Little Help from another person to put on and taking off regular upper body clothing?: A Little Help from another person to put on and taking off regular lower body clothing?: A Little 6 Click Score: 19    End of Session Equipment Utilized During Treatment: Gait belt;Rolling walker;Oxygen  OT Visit Diagnosis: Unsteadiness on feet (R26.81);Other abnormalities of gait and mobility (R26.89);Muscle weakness (generalized) (M62.81);Other symptoms and signs involving cognitive function   Activity Tolerance Patient limited by fatigue(pt stated he was tired from PT session prior to OT)   Patient Left in chair;with call bell/phone within reach;with chair alarm set   Nurse Communication Mobility status         Time: 4431-5400 OT Time Calculation (min): 12 min  Charges: OT General Charges $OT Visit: 1 Visit OT Treatments $Self Care/Home Management : 8-22 mins  Dorinda Hill OTR/L Acute Rehabilitation Services Office: [place] 05/11/2018, 12:19 PM

## 2018-05-11 NOTE — Progress Notes (Addendum)
Physical Therapy Treatment Patient Details Name: [name] MRN: [id number] DOB: 12-08-1940 Today's Date: 05/11/2018    History of Present Illness Pt presenting to hospital with SOB secondary to COPD exacerbation. Patient has had multiple hospitalizations for same cause. PMH of COPD, chronic hypoxic respiratory failure on 2 LPM, CAD S/P CABG, HTN, HLD, chronic diastolic CHF, PAF;     PT Comments    Patient refusing to ambulate in hallway, limited session in hospital room. desats on RA, pt encouraged to continue to wear his O2 (he had it off upon entry). sats well on 2L. Ambulating short distances without AD and no overt LOB.  Reports he uses RW for longer distances, agree with this for safety.   Update: per RN, patient has been having visual hallucinations and delirious today. Patients son is also not home as he is undergoing surgery. Given lack of caregiver support updating recs to SNF for supervision and reduce risk of fall. [facility], if avail, would also be appropriate from my viewpoint.    Follow Up Recommendations  SNF;Supervision/Assistance - 24 hour     Equipment Recommendations  None recommended by PT    Recommendations for Other Services       Precautions / Restrictions Precautions Precautions: Fall;Other (comment) Restrictions Weight Bearing Restrictions: No    Mobility  Bed Mobility Overal bed mobility: Needs Assistance Bed Mobility: Supine to Sit;Sit to Supine     Supine to sit: Supervision Sit to supine: Supervision   General bed mobility comments: Required supervision for safety for all bed mobility. Required increased time for bed mobility   Transfers Overall transfer level: Needs assistance Equipment used: Rolling walker (2 wheeled) Transfers: Sit to/from Stand Sit to Stand: Min guard         General transfer comment: Required min guard for safety in standing with use of RW. Verbal cues for hand placement prior to standing when using  RW.  Ambulation/Gait Ambulation/Gait assistance: Min guard Gait Distance (Feet): 30 Feet(patient requesting to stay in room) Assistive device: Rolling walker (2 wheeled) Gait Pattern/deviations: Step-through pattern;Decreased step length - right;Decreased step length - left;Decreased stride length Gait velocity: decreased   General Gait Details: Patient ambulated with min guard and use of RW for safety. Patient with SOB while ambulating. Oxygen saturations ranging from 84-95% on 2L during mobility. Required standing rest x2 during ambulation secondary to SOB. Oxygen returned to >90% on 2L following pursed lip breathing technique and seated rest break   Stairs             Wheelchair Mobility    Modified Rankin (Stroke Patients Only)       Balance Overall balance assessment: Needs assistance Sitting-balance support: No upper extremity supported;Feet supported Sitting balance-Leahy Scale: Good     Standing balance support: Bilateral upper extremity supported Standing balance-Leahy Scale: Poor Standing balance comment: reliant on BUE support and use of RW to maintain standing balance                            Cognition Arousal/Alertness: Awake/alert Behavior During Therapy: WFL for tasks assessed/performed Overall Cognitive Status: No family/caregiver present to determine baseline cognitive functioning Area of Impairment: Problem solving;Safety/judgement;Memory                     Memory: Decreased short-term memory   Safety/Judgement: Decreased awareness of safety;Decreased awareness of deficits   Problem Solving: Difficulty sequencing;Slow processing General Comments: Patient with  hx of dementia at baseline. Patient sitting EOB upon arrival confused about equipment in room.       Exercises      General Comments        Pertinent Vitals/Pain Pain Assessment: No/denies pain    Home Living                      Prior Function             PT Goals (current goals can now be found in the care plan section) Acute Rehab PT Goals Patient Stated Goal: to go home PT Goal Formulation: With patient Time For Goal Achievement: 05/24/18 Potential to Achieve Goals: Fair Progress towards PT goals: Progressing toward goals    Frequency    Min 3X/week      PT Plan Current plan remains appropriate    Co-evaluation              AM-PAC PT "6 Clicks" Mobility   Outcome Measure  Help needed turning from your back to your side while in a flat bed without using bedrails?: None Help needed moving from lying on your back to sitting on the side of a flat bed without using bedrails?: None Help needed moving to and from a bed to a chair (including a wheelchair)?: A Little Help needed standing up from a chair using your arms (e.g., wheelchair or bedside chair)?: A Little Help needed to walk in hospital room?: A Little Help needed climbing 3-5 steps with a railing? : A Lot 6 Click Score: 19    End of Session Equipment Utilized During Treatment: Gait belt;Oxygen Activity Tolerance: Patient limited by fatigue;Other (comment) Patient left: in bed;with call bell/phone within reach;with bed alarm set Nurse Communication: Mobility status PT Visit Diagnosis: Unsteadiness on feet (R26.81);Muscle weakness (generalized) (M62.81);Other abnormalities of gait and mobility (R26.89)     Time: 2122-4825 PT Time Calculation (min) (ACUTE ONLY): 14 min  Charges:  $Gait Training: 8-22 mins                    Reinaldo Berber, PT, DPT Acute Rehabilitation Services Pager: [phone] Office: [phone]    Reinaldo Berber 05/11/2018, 11:13 AM

## 2018-05-12 DIAGNOSIS — R410 Disorientation, unspecified: Secondary | ICD-10-CM | POA: Diagnosis not present

## 2018-05-12 LAB — GLUCOSE, CAPILLARY
Glucose-Capillary: 163 mg/dL — ABNORMAL HIGH (ref 70–99)
Glucose-Capillary: 248 mg/dL — ABNORMAL HIGH (ref 70–99)
Glucose-Capillary: 218 mg/dL — ABNORMAL HIGH (ref 70–99)
Glucose-Capillary: 235 mg/dL — ABNORMAL HIGH (ref 70–99)

## 2018-05-12 MED ORDER — INSULIN GLARGINE 100 UNIT/ML ~~LOC~~ SOLN
10.0000 [IU] | Freq: Every day | SUBCUTANEOUS | Status: DC
  Administered 2018-05-12 – 2018-05-13 (×2): 10 [IU] via SUBCUTANEOUS
  Filled 2018-05-12 (×2): qty 0.1

## 2018-05-12 MED ORDER — POLYETHYLENE GLYCOL 3350 17 G PO PACK
17.0000 g | PACK | Freq: Every day | ORAL | Status: DC
  Administered 2018-05-12 – 2018-05-14 (×3): 17 g via ORAL
  Filled 2018-05-12 (×3): qty 1

## 2018-05-12 NOTE — Progress Notes (Addendum)
Family Medicine Teaching Service Daily Progress Note Intern Pager: [phone]  Patient name: [name] Medical record number: [id number] Date of birth: [birth year] Age: 78 y.o. Gender: male  Primary Care Provider: Reubin Milan, MD Consultants: None Code Status: DNR/DNI  Pt Overview and Major Events to Date:  [name] is a 78 y.o. male presenting with SOB . PMH is significant for COPD (on 2L [place]), CAD s/p CABG x5, HTN, HLD, CHF, and paroxysmal a-fib (not on anticoagulation due to fall risk), and dementia.  Has been treated for a COPD exacerbation.  Assessment and Plan:  #Dyspnea secondary to COPD exacerbation w/ possible community acquired pneumonia, improved:  On home O2, [place] 2L. Afebrile on oral abx. Cont COPD controllers. Medically stable for discharge to SNF.  - Anoro Ellipta daily - Albuterol nebulizer prn q4h - Cefdir 300 mg BID, azithromycin 500 mg daily, five day course with end date 2/17 - prednisone 40 mg daily, five day course with end date 2/17 - Supplemental oxygen to keep pt within 88-92% SpO2 - Ambulate patient on dose oxygen supplement on which he will be going home. - Telemetry - humidify air in nasal canula, d/c IV   #Unsustained ventricular tachycardia: Had 2 episodes of asymptomatic, unsustained ventricular tachycardia, one with 6 beats and a second with 4 beats overnight.  Echo in May 2019 shows left atrial dilatation and increased pulmonary artery pressure, so there are structural reasons for ventricular tachycardia.  Patient is on diltiazem and may not be able to tolerate a beta-blocker due to a blood pressure of 95/64 overnight. - ask nursing to recheck blood pressure - check BMP and magnesium   #T2DM: A1C 7.6 (05/10/18). Hx of T2DM on insulin but no home insulin. CBGs past 24 hs 200-300s. Received 10 units lantus, 10 units aspart from sliding scale - continue Lantus 10 units - increase short acting to moderate sliding scale, HS  coverage  #Dementia/delirium: At baseline on admission per family.   - Continue home memantine 10 mg - Delirium precautions  #Constipation Complaining of hard stools. Stomache is mildly distended - Miralax prn  Chronic but stable medical conditions  #Paroxysmal A-fib Not on anticoagulation due to bleeding/falls risk.  - Continue home diltiazem 180 mg - Telemetry  #HFrEF:  Last echo 08/06/17 with EF 60-65%, mild LVH, mild AS, moderately thickened mitral valve, moderately dilated LA, mildly dialted RA. Patient without rhonchi or crackles on exam this morning but had worsening SOB overnight where bibasilar crackles were heard. No peripheral edema. I-STAT trop <0.03 x3. BNP 195.9 most recently 158 (5/19) but has had BNP 403 (2016). - Continue home Lasix 20 mg  #Hematuria, microscopic: Recurrent on urine microscopy 06/10/16 and 05/10/18.  Urine cx shows no growth. - Repeat urine microscopy in month, if persists then referral to Urology to R/O malignancy  #Abdominal Aortic Aneurysm  AAA 3.4 cm with intra-lumen thrombus on CT 2017. - Monitor AAA with abdominal aortic US.  If difficult transportation then get AAA [place] inhouse, if not, then schedule as outpatient.   #Hyperlipidemia: - Continue home atorvastatin 40 mg -Could consider discontinuing as may not provide as much benefit in this age range  #GERD: - Continue home Protonix and Mylanta - Mylanta  #Seasonal allergies: - Continue home claritin  FEN/GI: Heart healthy/carb mod PPx: Lovenox  Disposition: Discharge to SNF when bed available.  Subjective:  Patient says he has had some shortness of breath because his nasal canula will not stay in his nose, and he reports  that he has some nasal dryness.  He also says that his LUE IV is painful.  Objective: Temp:  [97.7 F (36.5 C)-98 F (36.7 C)] 97.7 F (36.5 C) (02/15 1710) Pulse Rate:  [64-84] 64 (02/15 1710) Resp:  [18] 18 (02/14 2145) BP: (127-144)/(74-84) 142/80  (02/15 1710) SpO2:  [96 %-99 %] 97 % (02/15 2116) Weight:  [62.6 kg] 62.6 kg (02/15 0500)   Physical Exam: General: elderly appearing, frail, NAD Cardiovascular: irregularly irregular Respiratory: CTAB, no wheezing, no increased work of breathing, 2L [place] to the side of nose Abdomen: Soft, nontender Extremities: No peripheral edema Psych: alert and oriented x 2, calm but delusional, wanting to go to cafeteria   Laboratory: Recent Labs  Lab 05/10/18 0858 05/10/18 1547 05/11/18 0219  WBC 7.8 4.3 9.0  HGB 14.4 14.3 13.8  HCT 45.4 43.6 43.6  PLT 148* 164 180   Recent Labs  Lab 05/10/18 0858 05/10/18 1547 05/10/18 1845 05/11/18 0219  NA 139  --   --  141  K 3.8  --   --  4.4  CL 100  --   --  106  CO2 25  --   --  28  BUN 8  --   --  17  CREATININE 0.90 1.29*  --  0.90  CALCIUM 8.5*  --   --  9.1  PROT 6.3*  --   --   --   BILITOT 1.3*  --   --   --   ALKPHOS 47  --   --   --   ALT 11  --   --   --   AST 15  --   --   --   GLUCOSE 189*  --  425* 209*      Imaging/Diagnostic Tests: Dg Chest 2 View  Result Date: 05/11/2018 CLINICAL DATA:  Initial evaluation for increased shortness of breath, history of COPD. EXAM: CHEST - 2 VIEW COMPARISON:  Prior radiograph from 05/10/2018. FINDINGS: Median sternotomy wires underlying CABG markers and surgical clips, stable. Cardiomegaly unchanged. Mediastinal silhouette normal. Aortic atherosclerosis. Hiatal hernia. Lungs somewhat hyperinflated with changes compatible COPD. Increased opacity within the mid left lung, concerning for possible superimposed infiltrate. No other new focal airspace disease. No pulmonary edema or definite pleural effusion. No pneumothorax. Osteopenia. Midthoracic compression deformity noted, stable. No acute osseous finding. IMPRESSION: 1. Progressive opacity at the mid left lung, concerning for developing infiltrate. 2. Underlying COPD. Electronically Signed   By: Jeannine Boga M.D.   On: 05/11/2018 04:56    Dg Chest 2 View  Result Date: 05/10/2018 CLINICAL DATA:  Cough and shortness of breath. Productive cough for 6 months EXAM: CHEST - 2 VIEW COMPARISON:  03/15/2018 FINDINGS: Chronic interstitial coarsening from COPD and emphysema. No convincing focal or acute airspace opacity but there is increased asymmetric markings on the left. No edema or effusion. No pneumothorax. No cardiomegaly. Moderate hiatal hernia. CABG. Chronic midthoracic compression fracture. IMPRESSION: 1. Mild increase in lung markings on the left since prior but no definite pneumonia. Consider short follow-up. 2. COPD. Electronically Signed   By: Monte Fantasia M.D.   On: 05/10/2018 09:57   Andreina Outten C. Shan Levans, MD PGY-2, [facility] 05/12/2018 9:28 PM

## 2018-05-12 NOTE — NC FL2 (Signed)
[place] LEVEL OF CARE SCREENING TOOL     IDENTIFICATION  Patient Name: [name] Birthdate: 14-Mar-1941 Sex: male Admission Date (Current Location): 05/10/2018  [facility] and [place] Number:  Herbalist and Address:  [facility], [place] [address] [place], [place] [address]      Provider Number: [id number]  Attending Physician Name and Address:  McDiarmid, Blane Ohara, MD  Relative Name and Phone Number:       Current Level of Care: Hospital Recommended Level of Care: [place] Prior Approval Number: [id number]  Date Approved/Denied: 05/12/14 PASRR Number:    Discharge Plan: SNF    Current Diagnoses: Patient Active Problem List   Diagnosis Date Noted  . Shortness of breath 05/11/2018  . Acute kidney injury ([place]) 05/10/2018  . Hematuria, microscopic 05/10/2018  . Dehydration 05/10/2018  . Abdominal aortic aneurysm ([place]) 05/10/2018  . Lung nodule, solitary   . Iron deficiency anemia 02/15/2015  . Compression fracture of L1 lumbar vertebra (HCC)   . Hypertension 08/23/2014  . COPD (chronic obstructive pulmonary disease) ([place]) 08/23/2014  . Chronic atrial fibrillation 08/23/2014  . Diabetes mellitus type II, controlled ([place]) 07/01/2014  . Acute on chronic respiratory failure ([place]) 05/14/2014  . Diastolic CHF ([place]) 60/12/9321  . COPD exacerbation ([place])   . CAP (community acquired pneumonia) 04/03/2014  . Allergic rhinitis 02/06/2014  . GERD (gastroesophageal reflux disease) 02/06/2014  . Do not resuscitate 02/06/2014  . Erectile dysfunction 06/18/2010  . Atrial fibrillation ([place]) 11/06/2009  . Peripheral vascular disease ([place]) 09/02/2009  . Carotid artery stenosis 04/16/2009  . ANEMIA, B12 DEFICIENCY 12/02/2008  . COPD GOLD III 05/21/2007  . TOBACCO USE 01/19/2007  . Hyperlipidemia 11/21/2006  . Essential hypertension 11/21/2006  . Hx of CABG 11/21/2006  . Osteoporosis 11/21/2006    Orientation  RESPIRATION BLADDER Height & Weight     Self, Time, Place  O2(96, nasal cannula, 2) Continent Weight: 138 lb (62.6 kg) Height:  5\' 3"  (160 cm)  BEHAVIORAL SYMPTOMS/MOOD NEUROLOGICAL BOWEL NUTRITION STATUS      Continent Diet(Cardiac/Heart Healthy, thin liquids)  AMBULATORY STATUS COMMUNICATION OF NEEDS Skin   Limited Assist Verbally Normal, Bruising(On arm)                       Personal Care Assistance Level of Assistance  Bathing, Feeding, Dressing, Total care Bathing Assistance: Limited assistance Feeding assistance: Independent Dressing Assistance: Limited assistance Total Care Assistance: Limited assistance   Functional Limitations Info  Sight, Hearing, Speech Sight Info: Adequate Hearing Info: Adequate Speech Info: Adequate    SPECIAL CARE FACTORS FREQUENCY  PT (By licensed PT), OT (By licensed OT)     PT Frequency: 5x/wk OT Frequency: 5x/wk            Contractures Contractures Info: Not present    Additional Factors Info  Code Status, Allergies, Insulin Sliding Scale Code Status Info: DNR Allergies Info:  Ticlopidine Hcl, Lorazepam, Other, Prednisone   Insulin Sliding Scale Info: insulin aspart novolog 0-5 units at bedtime, insulin aspart novolog 0-9 units 3x daily w/meals; insulin glargine (lantus) 10 units @ bedtime       Current Medications (05/12/2018):  This is the current hospital active medication list Current Facility-Administered Medications  Medication Dose Route Frequency Provider Last Rate Last Dose  . acidophilus (RISAQUAD) capsule   Oral Daily Bonnita Hollow, MD   1 capsule at 05/12/18 0912  . albuterol (PROVENTIL) (2.5 MG/3ML) 0.083%  nebulizer solution 2.5 mg  2.5 mg Nebulization Q4H PRN McDiarmid, Blane Ohara, MD   2.5 mg at 05/12/18 0901  . alum & mag hydroxide-simeth (MAALOX/MYLANTA) 200-200-20 MG/5ML suspension 30 mL  30 mL Topical PRN Bonnita Hollow, MD      . atorvastatin (LIPITOR) tablet 40 mg  40 mg Oral Daily Bonnita Hollow,  MD   40 mg at 05/12/18 0911  . azithromycin (ZITHROMAX) tablet 500 mg  500 mg Oral Daily Bonnita Hollow, MD   500 mg at 05/12/18 0911  . calcium-vitamin D (OSCAL WITH D) 500-200 MG-UNIT per tablet 1 tablet  1 tablet Oral BID Bonnita Hollow, MD   1 tablet at 05/12/18 0911  . cefdinir (OMNICEF) capsule 300 mg  300 mg Oral Q12H Benay Pike, MD   300 mg at 05/12/18 0911  . diltiazem (CARDIZEM CD) 24 hr capsule 180 mg  180 mg Oral Daily Bonnita Hollow, MD   180 mg at 05/12/18 0911  . enoxaparin (LOVENOX) injection 40 mg  40 mg Subcutaneous Q24H Bonnita Hollow, MD   40 mg at 05/11/18 1729  . feeding supplement (GLUCERNA SHAKE) (GLUCERNA SHAKE) liquid 237 mL  237 mL Oral TID BM Bonnita Hollow, MD   237 mL at 05/11/18 1603  . furosemide (LASIX) tablet 20 mg  20 mg Oral Daily Bonnita Hollow, MD   20 mg at 05/12/18 0911  . guaiFENesin-dextromethorphan (ROBITUSSIN DM) 100-10 MG/5ML syrup 5 mL  5 mL Oral Q4H PRN McDiarmid, Blane Ohara, MD      . Influenza vac split quadrivalent PF (FLUZONE HIGH-DOSE) injection 0.5 mL  0.5 mL Intramuscular Tomorrow-1000 Josephine Igo B, MD      . insulin aspart (novoLOG) injection 0-5 Units  0-5 Units Subcutaneous QHS Bonnita Hollow, MD   4 Units at 05/11/18 2135  . insulin aspart (novoLOG) injection 0-9 Units  0-9 Units Subcutaneous TID WC Bonnita Hollow, MD   2 Units at 05/12/18 0910  . insulin glargine (LANTUS) injection 10 Units  10 Units Subcutaneous QHS Bonnita Hollow, MD      . loratadine (CLARITIN) tablet 10 mg  10 mg Oral Daily Bonnita Hollow, MD   10 mg at 05/12/18 0912  . Melatonin TABS 6 mg  6 mg Oral QHS Bonnita Hollow, MD   6 mg at 05/11/18 2137  . memantine (NAMENDA) tablet 10 mg  10 mg Oral Daily Bonnita Hollow, MD   10 mg at 05/12/18 0911  . pantoprazole (PROTONIX) EC tablet 40 mg  40 mg Oral BID Bonnita Hollow, MD   40 mg at 05/12/18 0911  . potassium chloride SA (K-DUR,KLOR-CON) CR tablet 20 mEq  20 mEq Oral Daily  Bonnita Hollow, MD   20 mEq at 05/12/18 0911  . predniSONE (DELTASONE) tablet 40 mg  40 mg Oral Q breakfast Skeet Simmer, RPH   40 mg at 05/12/18 0912  . umeclidinium-vilanterol (ANORO ELLIPTA) 62.5-25 MCG/INH 1 puff  1 puff Inhalation Daily Bonnita Hollow, MD   1 puff at 05/12/18 3716     Discharge Medications: Please see discharge summary for a list of discharge medications.  Relevant Imaging Results:  Relevant Lab Results:   Additional Information SSN: [id number]  Philippa Chester Shakea Isip, LCSWA

## 2018-05-12 NOTE — Progress Notes (Signed)
PT Note  Received request from MD to see pt about possible ST-SNF. Per PT note yesterday the recommendation was already for SNF placement.  [place] Pager [phone] Office [phone]

## 2018-05-12 NOTE — Progress Notes (Addendum)
Family Medicine Teaching Service Daily Progress Note Intern Pager: [phone]  Patient name: [name] Medical record number: [id number] Date of birth: 1940-11-14 Age: 78 y.o. Gender: male  Primary Care Provider: Reubin Milan, MD Consultants: None Code Status: DNR/DNI  Pt Overview and Major Events to Date:  [name] is a 78 y.o. male presenting with SOB . PMH is significant for COPD (on 2L [place]), CAD s/p CABG x5, HTN, HLD, CHF, and paroxysmal a-fib (not on anticoagulation due to fall risk), and dementia.  Assessment and Plan:  #Dyspnea secondary to COPD exacerbation w/ possible community acquired pneumonia, improved:  On home O2, [place] 2L. Afebrile on oral abx. Cont COPD controllers. Medically stable for discharge.  - Anoro Ellipta daily - Albuterol nebulizer prn q4h - Cefdir 300 mg BID, azithromycin 500 mg daily, abx 3 of 5 days - prednisone 40 mg daily, steroids 3 days of 5 - Supplemental oxygen to keep pt above 89% with exertion, if possible. - Ambulate patient on dose oxygen supplement on which he will be going home. - Telemetry  #T2DM: A1C 7.6 (05/10/18). Hx of T2DM on insulin but no home insulin. BGs in the 400s yesterday after dose of prednisone. CBGs past 24 hs 200-300s. Received 5 units lantus. 19 units aspart from sliding scale. - Increase basal insulin to 10 units - sSSI, HS coverage  #Dementia/delirium: At baseline on admission per family. Reported hallucinations today, seeing his son and daughter in the room, but no one was present. This was concerning to family. They are wanting SNF placement at this time while HCPOA and primary caregiver, his son, is recovering from back surgery. Will reconsult PT for evaluation for SNF.  - Continue home memantine 10 mg - will follow up with SW and PT/OT for SNF recommendations and placement - Delirium precautions  #Constipation Complaining of hard stools. Stomache is mildly distended - Miralax prn  Chronic, but stable medical  conditions  #Paroxysmal A-fib Not on anticoagulation due to bleeding/falls risk.  - Continue home diltiazem 180 mg - Telemetry  #HFrEF:  Last echo 08/06/17 with EF 60-65%, mild LVH, mild AS, moderately thickened mitral valve, moderately dilated LA, mildly dialted RA. Patient without rhonchi or crackles on exam this morning but had worsening SOB overnight where bibasilar crackles were heard. No peripheral edema. I-STAT trop <0.03 x3. BNP 195.9 most recently 158 (5/19) but has had BNP 403 (2016). - Continue home Lasix 20 mg  #Hematuria, microscopic: Recurrent on urine microscopy 06/10/16 and 05/10/18. - F/U urine culture - Repeat urine microscopy in month, if persists then referral to Urology to R/O malignancy  #Abdominal Aortic Aneurysm  AAA 3.4 cm with intra-lumen thrombus on CT 2017. - Monitor AAA with abdominal aortic US.  If difficult transportation then get [facility] [place] inhouse, if not, then schedule as outpatient.   #Hyperlipidemia: - Continue home atorvastatin 40 mg -Could consider discontinuing as may not provide as much benefit in this age range  #GERD: - Continue home Protonix and Mylanta - Mylanta  #Seasonal allergies: - Continue home claritin  FEN/GI: Heart healthy/carb mod PPx: Lovenox  Disposition: telemetry, Possibly to SNF.  Subjective:  Patient no complaints of breathing trouble. He is concerned about his son in the ICU. He reports that his other son and daughter have been visiting him and telling him to go to the [facility] to get home health services for 24 hr care. No one is in the room with him. This was confirmed by his nurse, who he  later called his daughter.   Objective: Temp:  [97.5 F (36.4 C)-98 F (36.7 C)] 98 F (36.7 C) (02/14 2145) Pulse Rate:  [70-84] 76 (02/15 0749) Resp:  [18] 18 (02/14 2145) BP: (127-144)/(60-84) 127/74 (02/15 0749) SpO2:  [93 %-98 %] 98 % (02/15 0749) Weight:  [62.6 kg] 62.6 kg (02/15 0500)   Physical Exam: General:  elderly appearing, frail, NAD Cardiovascular: A-fib Respiratory: Mild wheezing throughout, improved air movement, NWOB, 2L [place] in place Abdomen: Soft, mildly distended Extremities: No peripheral edema Psych: A&Ox2, not oriented to time, seeing hallucinations  Laboratory: Recent Labs  Lab 05/10/18 0858 05/10/18 1547 05/11/18 0219  WBC 7.8 4.3 9.0  HGB 14.4 14.3 13.8  HCT 45.4 43.6 43.6  PLT 148* 164 180   Recent Labs  Lab 05/10/18 0858 05/10/18 1547 05/10/18 1845 05/11/18 0219  NA 139  --   --  141  K 3.8  --   --  4.4  CL 100  --   --  106  CO2 25  --   --  28  BUN 8  --   --  17  CREATININE 0.90 1.29*  --  0.90  CALCIUM 8.5*  --   --  9.1  PROT 6.3*  --   --   --   BILITOT 1.3*  --   --   --   ALKPHOS 47  --   --   --   ALT 11  --   --   --   AST 15  --   --   --   GLUCOSE 189*  --  425* 209*      Imaging/Diagnostic Tests: Dg Chest 2 View  Result Date: 05/11/2018 CLINICAL DATA:  Initial evaluation for increased shortness of breath, history of COPD. EXAM: CHEST - 2 VIEW COMPARISON:  Prior radiograph from 05/10/2018. FINDINGS: Median sternotomy wires underlying CABG markers and surgical clips, stable. Cardiomegaly unchanged. Mediastinal silhouette normal. Aortic atherosclerosis. Hiatal hernia. Lungs somewhat hyperinflated with changes compatible COPD. Increased opacity within the mid left lung, concerning for possible superimposed infiltrate. No other new focal airspace disease. No pulmonary edema or definite pleural effusion. No pneumothorax. Osteopenia. Midthoracic compression deformity noted, stable. No acute osseous finding. IMPRESSION: 1. Progressive opacity at the mid left lung, concerning for developing infiltrate. 2. Underlying COPD. Electronically Signed   By: Jeannine Boga M.D.   On: 05/11/2018 04:56   Dg Chest 2 View  Result Date: 05/10/2018 CLINICAL DATA:  Cough and shortness of breath. Productive cough for 6 months EXAM: CHEST - 2 VIEW COMPARISON:   03/15/2018 FINDINGS: Chronic interstitial coarsening from COPD and emphysema. No convincing focal or acute airspace opacity but there is increased asymmetric markings on the left. No edema or effusion. No pneumothorax. No cardiomegaly. Moderate hiatal hernia. CABG. Chronic midthoracic compression fracture. IMPRESSION: 1. Mild increase in lung markings on the left since prior but no definite pneumonia. Consider short follow-up. 2. COPD. Electronically Signed   By: Monte Fantasia M.D.   On: 05/10/2018 09:57   Bonnita Hollow, MD 05/12/2018, 8:26 AM [place] Intern pager: [phone], text pages welcome

## 2018-05-12 NOTE — Progress Notes (Signed)
Attempted multiple times to reach out to patient's healthcare power of attorney, his son Erma, Joubert.  His son is currently located on [address]N06 after having back surgery.  Was able to reach Mr. Kohles nephew in law Mr. Eugenie Birks.  He was also present at admission.  Spoke with Mr. Eugenie Birks that we believe that is safest for patient to be discharged to SNF as he does not have the full support that he normally does at home.  Mr. Eugenie Birks was in communication with Mr. Mcfetridge Junior this a.m.  Mr. was communicated to me that Mr. Maule Junior was in agreement that the patient should go to SNF.  Patient this morning was also in agreement with SNF, although was mildly delirious.  Will pursue SNF placement and update the family as things go along and continue to reach out to family to any other updates.

## 2018-05-13 ENCOUNTER — Encounter (HOSPITAL_COMMUNITY): Payer: Self-pay | Admitting: Family Medicine

## 2018-05-13 DIAGNOSIS — J432 Centrilobular emphysema: Secondary | ICD-10-CM

## 2018-05-13 DIAGNOSIS — J9612 Chronic respiratory failure with hypercapnia: Secondary | ICD-10-CM

## 2018-05-13 DIAGNOSIS — I4819 Other persistent atrial fibrillation: Secondary | ICD-10-CM

## 2018-05-13 DIAGNOSIS — I272 Pulmonary hypertension, unspecified: Secondary | ICD-10-CM

## 2018-05-13 LAB — MAGNESIUM: Magnesium: 2.1 mg/dL (ref 1.7–2.4)

## 2018-05-13 LAB — BASIC METABOLIC PANEL
Anion gap: 10 (ref 5–15)
BUN: 16 mg/dL (ref 8–23)
CO2: 33 mmol/L — ABNORMAL HIGH (ref 22–32)
Calcium: 9.5 mg/dL (ref 8.9–10.3)
Chloride: 95 mmol/L — ABNORMAL LOW (ref 98–111)
Creatinine, Ser: 0.95 mg/dL (ref 0.61–1.24)
GFR calc Af Amer: >60 mL/min (ref >60)
GFR calc non Af Amer: >60 mL/min (ref >60)
Glucose, Bld: 174 mg/dL — ABNORMAL HIGH (ref 70–99)
POTASSIUM: 4.1 mmol/L (ref 3.5–5.1)
Sodium: 138 mmol/L (ref 135–145)

## 2018-05-13 LAB — GLUCOSE, CAPILLARY
GLUCOSE-CAPILLARY: 139 mg/dL — AB (ref 70–99)
GLUCOSE-CAPILLARY: 151 mg/dL — AB (ref 70–99)
Glucose-Capillary: 232 mg/dL — ABNORMAL HIGH (ref 70–99)
Glucose-Capillary: 83 mg/dL (ref 70–99)

## 2018-05-13 MED ORDER — INSULIN ASPART 100 UNIT/ML ~~LOC~~ SOLN
0.0000 [IU] | Freq: Three times a day (TID) | SUBCUTANEOUS | Status: DC
  Administered 2018-05-13: 3 [IU] via SUBCUTANEOUS
  Administered 2018-05-13: 5 [IU] via SUBCUTANEOUS

## 2018-05-13 NOTE — Progress Notes (Signed)
CSW attempted to call and complete an assessment with the patient's son. Patient is orient x1. CSW left a voicemail.   CSW called RN and asked if there was any family at bedside. RN reported that no one had been by today but the patients son Jamille, Yoshino is also in the hospital who had back surgery. RN reported that the patient has another son named Enrique Manganaro. His phone number is [phone]. RN reported that both sons are in agreement for SNF.   CSW called and left a message for the patients other son [name]. CSW left a VM requesting a call back.   CSW will attempt to complete assessment at another time.   Domenic Schwab, MSW, [place]

## 2018-05-13 NOTE — Progress Notes (Addendum)
Patient striped off all her leads and refused to let staff put it back on. Demanded to get breathing treatment else he wont wear his cardiac monitor and would go home to get his inhaler. 02 sat  was 98 %  on 2 L.  Patient was informed it wasn't quite yet to receive his breathing treament and was due in 20 minutes.  Patient allowed the monitor to be placed while he was receiving the albuterol. No acute distress noted. Will continue to monitor.

## 2018-05-13 NOTE — Clinical Social Work Note (Signed)
Clinical Social Work Assessment  Patient Details  Name: [name] MRN: [id number] Date of Birth: 1940-05-27  Date of referral:  05/13/18               Reason for consult:  Discharge Planning                Permission sought to share information with:  Case Manager Permission granted to share information::  Yes, Verbal Permission Granted  Name::     [name]  Agency::  [facility]  Relationship::  Son  Sport and exercise psychologist Information:     Housing/Transportation Living arrangements for the past 2 months:  Single Family Home Source of Information:  Adult Children Patient Interpreter Needed:  None Criminal Activity/Legal Involvement Pertinent to Current Situation/Hospitalization:  No - Comment as needed Significant Relationships:  Adult Children Lives with:  Self, Adult Children Do you feel safe going back to the place where you live?  Yes(But patient needs familial assistance at home) Need for family participation in patient care:  Yes (Comment)(Pt son TRUE Garciamartinez is POA)  Care giving concerns:    Patient is orient x3 and has adult son is power of attorney. [name], patient son and POA is also in the hospital recovering from having back surgery. Patient son [name], requested that he be contacted or his cousin over his brother. They want to let him recover and not inflict added stress. Patient cannot live at home alone without additional care or supervision.    Social Worker assessment / plan:    CSW spoke with the patients son [name] over the phone. Patient's son lives and works in [place], [place]. He has stepped up to help care and make decisions for his father while his brother is recovering from having back surgery. Both sons are in agreement that the patient could benefit from going to a skilled nursing facility. Families preference is [facility] in [place]. Please give bed offers to [name] as they come.   Employment status:  Retired Forensic scientist:  Medicare PT Recommendations:     Information / Referral to community resources:  [place]  Patient/Family's Response to care:  Patient's sons are understanding about the patients current medical condition. They would like their father to receive additional services that they cannot provide right now. They would like the patient to return to his baseline.   Patient/Family's Understanding of and Emotional Response to Diagnosis, Current Treatment, and Prognosis:  Both sons are supportive of the patient receiving skilled nursing assistance.   Emotional Assessment Appearance:  Appears stated age Attitude/Demeanor/Rapport:  Unable to Assess Affect (typically observed):  Unable to Assess Orientation:  Oriented to Self, Oriented to Situation, Oriented to Place Alcohol / Substance use:  Not Applicable Psych involvement (Current and /or in the community):     Discharge Needs  Concerns to be addressed:  Discharge Planning Concerns Readmission within the last 30 days:  No Current discharge risk:  Dependent with Mobility Barriers to Discharge:  Ship broker, Continued Medical Work up   [employer], [place] 05/13/2018, 3:32 PM

## 2018-05-13 NOTE — Progress Notes (Signed)
Patient had 6 beat of VT and another 4 beat of VT. He was asymptomatic on reassessment. Paged Dr. Broadus John and didn't receive any call back with new orders. Will continue to monitor.

## 2018-05-14 LAB — GLUCOSE, CAPILLARY
GLUCOSE-CAPILLARY: 114 mg/dL — AB (ref 70–99)
Glucose-Capillary: 164 mg/dL — ABNORMAL HIGH (ref 70–99)

## 2018-05-14 MED ORDER — UMECLIDINIUM-VILANTEROL 62.5-25 MCG/INH IN AEPB: 1.0000 | INHALATION_SPRAY | Freq: Every day | RESPIRATORY_TRACT | Status: DC

## 2018-05-14 MED ORDER — CEFDINIR 300 MG PO CAPS: 300.0000 mg | ORAL_CAPSULE | Freq: Two times a day (BID) | ORAL | Status: DC

## 2018-05-14 MED ORDER — INSULIN ASPART 100 UNIT/ML ~~LOC~~ SOLN
0.0000 [IU] | Freq: Three times a day (TID) | SUBCUTANEOUS | Status: DC
  Administered 2018-05-14: 4 [IU] via SUBCUTANEOUS

## 2018-05-14 MED ORDER — INFLUENZA VAC SPLIT HIGH-DOSE 0.5 ML IM SUSY: 0.5000 mL | PREFILLED_SYRINGE | INTRAMUSCULAR | 0 refills | Status: AC

## 2018-05-14 NOTE — Clinical Social Work Note (Signed)
Clinical Social Worker facilitated patient discharge including contacting patient family and facility to confirm patient discharge plans.  Clinical information faxed to facility and family agreeable with plan.  CSW arranged ambulance transport via [facility] to [facility] ([facility]).  RN to call [phone] for report prior to discharge.  Clinical Social Worker will sign off for now as social work intervention is no longer needed. Please consult [place] again if new need arises.  [place], [place]

## 2018-05-14 NOTE — Care Management Important Message (Signed)
Important Message  Patient Details  Name: [name] MRN: [id number] Date of Birth: July 19, 1940   Medicare Important Message Given:  Yes    Orbie Pyo 05/14/2018, 3:30 PM

## 2018-05-14 NOTE — Care Management Important Message (Signed)
Important Message  Patient Details  Name: [name] MRN: [id number] Date of Birth: [birth year]   Medicare Important Message Given:  Yes    Orbie Pyo 05/14/2018, 3:23 PM

## 2018-05-14 NOTE — Progress Notes (Signed)
[facility] given all discharge paper work and patient belongings. All pages reviewed and subsequent questions answered. Patients telemetry and peripheral IV's discontinued without complications. Patient placed in stretcher and escorted by [facility] staff member. Report called to Phylis at [facility] [phone]. English as a second language teacher

## 2018-05-14 NOTE — Clinical Social Work Note (Signed)
Pt has a bed at [facility] today. RN notified.   [place], [place]

## 2018-05-14 NOTE — Clinical Social Work Placement (Signed)
   CLINICAL SOCIAL WORK PLACEMENT  NOTE  Date:  05/14/2018  Patient Details  Name: [name] MRN: [id number] Date of Birth: 08-26-1940  Clinical Social Work is seeking post-discharge placement for this patient at the [place] level of care (*CSW will initial, date and re-position this form in  chart as items are completed):      Patient/family provided with [facility] Work Department's list of facilities offering this level of care within the geographic area requested by the patient (or if unable, by the patient's family).  Yes   Patient/family informed of their freedom to choose among providers that offer the needed level of care, that participate in Medicare, Medicaid or managed care program needed by the patient, have an available bed and are willing to accept the patient.      Patient/family informed of [facility]'s ownership interest in [facility] and [facility], as well as of the fact that they are under no obligation to receive care at these facilities.  PASRR submitted to EDS on       PASRR number received on       Existing PASRR number confirmed on       FL2 transmitted to all facilities in geographic area requested by pt/family on       FL2 transmitted to all facilities within larger geographic area on       Patient informed that his/her managed care company has contracts with or will negotiate with certain facilities, including the following:        Yes   Patient/family informed of bed offers received.  Patient chooses bed at [facility]     Physician recommends and patient chooses bed at      Patient to be transferred to [facility] on 05/14/18.  Patient to be transferred to facility by [facility]     Patient family notified on 05/14/18 of transfer.  Name of family member notified:  [name]     PHYSICIAN       Additional Comment:    _______________________________________________ Eileen Stanford,  LCSW 05/14/2018, 2:01 PM

## 2018-05-14 NOTE — Progress Notes (Signed)
Physical Therapy Treatment Patient Details Name: [name] MRN: [id number] DOB: 11-09-1940 Today's Date: 05/14/2018    History of Present Illness Pt presenting to hospital with SOB secondary to COPD exacerbation. Patient has had multiple hospitalizations for same cause. PMH of COPD, chronic hypoxic respiratory failure on 2 LPM, CAD S/P CABG, HTN, HLD, chronic diastolic CHF, PAF;     PT Comments    Pt received sitting EOB, agreeable to participation in therapy. Pt expressing concern over his son who is currently in the hospital following back sx. Pt required supervision transfers and min guard assist ambulation 150 feet with RW. Pt on 2 L O2 throughout session with SpO2 > 90%.     Follow Up Recommendations  SNF     Equipment Recommendations  None recommended by PT    Recommendations for Other Services       Precautions / Restrictions Precautions Precautions: Fall    Mobility  Bed Mobility Overal bed mobility: Modified Independent             General bed mobility comments: +rail  Transfers Overall transfer level: Needs assistance Equipment used: Rolling walker (2 wheeled) Transfers: Sit to/from Stand Sit to Stand: Supervision         General transfer comment: supervision for safety  Ambulation/Gait Ambulation/Gait assistance: Min guard Gait Distance (Feet): 150 Feet Assistive device: Rolling walker (2 wheeled) Gait Pattern/deviations: Step-through pattern;Decreased stride length Gait velocity: decreased Gait velocity interpretation: <1.31 ft/sec, indicative of household ambulator General Gait Details: Pt ambulated on 2 L O2 with SpO2 >90%.   Stairs             Wheelchair Mobility    Modified Rankin (Stroke Patients Only)       Balance Overall balance assessment: Needs assistance Sitting-balance support: No upper extremity supported;Feet supported Sitting balance-Leahy Scale: Good     Standing balance support: Bilateral upper extremity  supported;During functional activity Standing balance-Leahy Scale: Poor Standing balance comment: reliant on RW                            Cognition Arousal/Alertness: Awake/alert Behavior During Therapy: WFL for tasks assessed/performed Overall Cognitive Status: No family/caregiver present to determine baseline cognitive functioning Area of Impairment: Safety/judgement;Memory                     Memory: Decreased short-term memory   Safety/Judgement: Decreased awareness of safety            Exercises      General Comments        Pertinent Vitals/Pain Pain Assessment: No/denies pain    Home Living                      Prior Function            PT Goals (current goals can now be found in the care plan section) Acute Rehab PT Goals Patient Stated Goal: home PT Goal Formulation: With patient Time For Goal Achievement: 05/24/18 Potential to Achieve Goals: Fair Progress towards PT goals: Progressing toward goals    Frequency    Min 3X/week      PT Plan Current plan remains appropriate    Co-evaluation              AM-PAC PT "6 Clicks" Mobility   Outcome Measure  Help needed turning from your back to your side while in a flat bed without using bedrails?:  None Help needed moving from lying on your back to sitting on the side of a flat bed without using bedrails?: None Help needed moving to and from a bed to a chair (including a wheelchair)?: A Little Help needed standing up from a chair using your arms (e.g., wheelchair or bedside chair)?: A Little Help needed to walk in hospital room?: A Little Help needed climbing 3-5 steps with a railing? : A Lot 6 Click Score: 19    End of Session Equipment Utilized During Treatment: Gait belt;Oxygen Activity Tolerance: Patient tolerated treatment well Patient left: in bed;with call bell/phone within reach;with bed alarm set Nurse Communication: Mobility status PT Visit Diagnosis:  Unsteadiness on feet (R26.81);Muscle weakness (generalized) (M62.81);Other abnormalities of gait and mobility (R26.89)     Time: 3335-4562 PT Time Calculation (min) (ACUTE ONLY): 14 min  Charges:  $Gait Training: 8-22 mins                     Lorrin Goodell, PT  Office # [phone] Pager [phone]    Lorriane Shire 05/14/2018, 12:24 PM

## 2018-05-14 NOTE — Progress Notes (Signed)
Family Medicine Teaching Service Daily Progress Note Intern Pager: [phone]  Patient name: [name] Medical record number: [id number] Date of birth: October 30, 1940 Age: 78 y.o. Gender: male  Primary Care Provider: Reubin Milan, MD Consultants: None Code Status: DNR/DNI  Pt Overview and Major Events to Date:  [name] is a 78 y.o. male presenting with SOB . PMH is significant for COPD (on 2L [place]), CAD s/p CABG x5, HTN, HLD, CHF, and paroxysmal a-fib (not on anticoagulation due to fall risk), and dementia.  Has been treated for a COPD exacerbation.  Assessment and Plan:  #COPD exacerbation w/ possible community acquired pneumonia, improved:  On home O2, [place] 2L. Afebrile on oral abx. Cont COPD controllers. Medically stable for discharge to SNF.  - Anoro Ellipta daily - Albuterol nebulizer prn q4h - Cefdir 300 mg BID, azithromycin 500 mg daily, five day course with end date 2/17 - prednisone 40 mg daily, five day course with end date 2/17 - Supplemental oxygen to keep pt within 88-92% SpO2 - Telemetry - humidify air in nasal canula, d/c IV   #Paroxysmal A-fib, rate controlled  Episodes of unsustained ventricular tachycardia: Not on anticoagulation due to bleeding/falls risk. Had 2 episodes of asymptomatic, unsustained ventricular tachycardia, one with 6 beats and a second with 4 beats yesterday night.  Echo in May 2019 shows left atrial dilatation and increased pulmonary artery pressure, so there are structural reasons for ventricular tachycardia. - Continue home diltiazem 180 mg - Telemetry  #T2DM: A1C 7.6 (05/10/18). Hx of T2DM on insulin but no home insulin. CBGs past 24 hs 83-1 74. Received 10 units lantus, 8 units aspart from sliding scale -Last day of steroids, so will DC Lantus and HS coverage - increase short acting to resistant sliding scale -montior CBGs 4xday  #Dementia/delirium: At baseline on admission per family.   - Continue home memantine 10 mg - Delirium  precautions  #Constipation Complaining of hard stools. Stomache is mildly distended - Miralax prn  Chronic but stable medical conditions  #HFrEF:  Last echo 08/06/17 with EF 60-65%, mild LVH, mild AS, moderately thickened mitral valve, moderately dilated LA, mildly dialted RA. Patient without rhonchi or crackles on exam this morning but had worsening SOB overnight where bibasilar crackles were heard. No peripheral edema. I-STAT trop <0.03 x3. BNP 195.9 most recently 158 (5/19) but has had BNP 403 (2016). - Continue home Lasix 20 mg  #Hematuria, microscopic: Recurrent on urine microscopy 06/10/16 and 05/10/18.  Urine cx shows no growth. - Repeat urine microscopy in month, if persists then referral to Urology to R/O malignancy  #Abdominal Aortic Aneurysm  AAA 3.4 cm with intra-lumen thrombus on CT 2017. - Monitor AAA with abdominal aortic US.  If difficult transportation then get AAA [place] inhouse, if not, then schedule as outpatient.   #Hyperlipidemia: - Continue home atorvastatin 40 mg -Could consider discontinuing as may not provide as much benefit in this age range  #GERD: - Continue home Protonix and Mylanta - Mylanta  #Seasonal allergies: - Continue home claritin  FEN/GI: Heart healthy/carb mod PPx: Lovenox  Disposition: Discharge to SNF when bed available.  Subjective:  Patient denies any trouble with breathing when he is on his 2 L of nasal cannula.  Patient had one episode last night where he was trying to pull off leads, however is able to be reoriented.  Patient needed help patient called the son today at home, assisted patient using his cell phone.  Unfortunate was not able to get his  son and so he left a Advertising account executive.  Objective: Temp:  [97.7 F (36.5 C)-98.4 F (36.9 C)] 97.9 F (36.6 C) (02/17 0755) Pulse Rate:  [54-122] 79 (02/17 0755) Resp:  [14-20] 20 (02/16 2300) BP: (96-152)/(66-97) 127/96 (02/17 0755) SpO2:  [91 %-98 %] 93 % (02/17 0902)   Physical  Exam: General: elderly appearing, frail, NAD Cardiovascular: irregularly irregular Respiratory: CTAB, no wheezing, no increased work of breathing, 2L [place]  Abdomen: Soft, nontender Extremities: No peripheral edema Psych: alert and oriented x 2  Laboratory: Recent Labs  Lab 05/10/18 0858 05/10/18 1547 05/11/18 0219  WBC 7.8 4.3 9.0  HGB 14.4 14.3 13.8  HCT 45.4 43.6 43.6  PLT 148* 164 180   Recent Labs  Lab 05/10/18 0858 05/10/18 1547 05/10/18 1845 05/11/18 0219 05/13/18 0954  NA 139  --   --  141 138  K 3.8  --   --  4.4 4.1  CL 100  --   --  106 95*  CO2 25  --   --  28 33*  BUN 8  --   --  17 16  CREATININE 0.90 1.29*  --  0.90 0.95  CALCIUM 8.5*  --   --  9.1 9.5  PROT 6.3*  --   --   --   --   BILITOT 1.3*  --   --   --   --   ALKPHOS 47  --   --   --   --   ALT 11  --   --   --   --   AST 15  --   --   --   --   GLUCOSE 189*  --  425* 209* 174*      Imaging/Diagnostic Tests: Dg Chest 2 View  Result Date: 05/11/2018 CLINICAL DATA:  Initial evaluation for increased shortness of breath, history of COPD. EXAM: CHEST - 2 VIEW COMPARISON:  Prior radiograph from 05/10/2018. FINDINGS: Median sternotomy wires underlying CABG markers and surgical clips, stable. Cardiomegaly unchanged. Mediastinal silhouette normal. Aortic atherosclerosis. Hiatal hernia. Lungs somewhat hyperinflated with changes compatible COPD. Increased opacity within the mid left lung, concerning for possible superimposed infiltrate. No other new focal airspace disease. No pulmonary edema or definite pleural effusion. No pneumothorax. Osteopenia. Midthoracic compression deformity noted, stable. No acute osseous finding. IMPRESSION: 1. Progressive opacity at the mid left lung, concerning for developing infiltrate. 2. Underlying COPD. Electronically Signed   By: Jeannine Boga M.D.   On: 05/11/2018 04:56   Dg Chest 2 View  Result Date: 05/10/2018 CLINICAL DATA:  Cough and shortness of breath.  Productive cough for 6 months EXAM: CHEST - 2 VIEW COMPARISON:  03/15/2018 FINDINGS: Chronic interstitial coarsening from COPD and emphysema. No convincing focal or acute airspace opacity but there is increased asymmetric markings on the left. No edema or effusion. No pneumothorax. No cardiomegaly. Moderate hiatal hernia. CABG. Chronic midthoracic compression fracture. IMPRESSION: 1. Mild increase in lung markings on the left since prior but no definite pneumonia. Consider short follow-up. 2. COPD. Electronically Signed   By: Monte Fantasia M.D.   On: 05/10/2018 09:57   Josephine Igo, MD PGY-2, [facility] 05/14/2018 9:06 AM

## 2018-05-14 NOTE — Progress Notes (Signed)
Occupational Therapy Treatment Patient Details Name: [name] MRN: [id number] DOB: 02-May-1940 Today's Date: 05/14/2018    History of present illness Pt presenting to hospital with SOB secondary to COPD exacerbation. Patient has had multiple hospitalizations for same cause. PMH of COPD, chronic hypoxic respiratory failure on 2 LPM, CAD S/P CABG, HTN, HLD, chronic diastolic CHF, PAF;    OT comments  Pt progressing toward established OT goals. Pt required minguard to don UB/LB clothing. Pt donned button-down shirt while standing without external support. Due to limited caregiver support and pt's cognitive limitations and physical limitations he will benefit from skilled OT services in sub-acute setting to maximize his independence and safety with ADL and functional mobility. Pt's son present during session. All additional OT needs to be addressed at next venue, pt's current level of functioning appropriate for d/c when medically appropriate. OT to sign off.    Follow Up Recommendations  SNF;Supervision/Assistance - 24 hour    Equipment Recommendations  Other (comment)(defer to next venue)    Recommendations for Other Services      Precautions / Restrictions Precautions Precautions: Fall       Mobility Bed Mobility Overal bed mobility: Modified Independent Bed Mobility: Supine to Sit           General bed mobility comments: +rail  Transfers Overall transfer level: Needs assistance Equipment used: Rolling walker (2 wheeled) Transfers: Sit to/from Stand Sit to Stand: Min guard         General transfer comment: supervision for safety    Balance Overall balance assessment: Needs assistance Sitting-balance support: No upper extremity supported;Feet unsupported Sitting balance-Leahy Scale: Good Sitting balance - Comments: pt donned socks and shoes while sitting EOB   Standing balance support: No upper extremity supported;During functional activity Standing balance-Leahy  Scale: Fair Standing balance comment: pt donned button down shirt while standing                           ADL either performed or assessed with clinical judgement   ADL Overall ADL's : Needs assistance/impaired                 Upper Body Dressing : Min guard;Standing Upper Body Dressing Details (indicate cue type and reason): pt donned shirt in standing with minguard for stability Lower Body Dressing: Min guard;Sit to/from stand Lower Body Dressing Details (indicate cue type and reason): pt donned socks, shoes while sitting EOB minguard for sit<>stand              Functional mobility during ADLs: Min guard;Rolling walker General ADL Comments: VSS throughout session     Vision       Perception     Praxis      Cognition Arousal/Alertness: Awake/alert Behavior During Therapy: WFL for tasks assessed/performed Overall Cognitive Status: History of cognitive impairments - at baseline Area of Impairment: Safety/judgement;Memory                     Memory: Decreased short-term memory   Safety/Judgement: Decreased awareness of safety              Exercises     Shoulder Instructions       General Comments pt's son-in-law present during session;pt's son has no additional questions about POC;pt on 2lnc throughout session;pt had previous cut on hand from unknown source, started bleeding, applied bandaid, nsg aware    Pertinent Vitals/ Pain  Pain Assessment: No/denies pain  Home Living                                          Prior Functioning/Environment              Frequency  Min 2X/week        Progress Toward Goals  OT Goals(current goals can now be found in the care plan section)  Progress towards OT goals: Progressing toward goals  Acute Rehab OT Goals Patient Stated Goal: to go home OT Goal Formulation: With patient Time For Goal Achievement: 05/24/18 Potential to Achieve Goals: Good ADL  Goals Pt Will Perform Grooming: with modified independence;standing Pt Will Perform Lower Body Dressing: with modified independence;sit to/from stand Pt Will Transfer to Toilet: with modified independence;ambulating Additional ADL Goal #1: Pt/caregiver will demonstrate understanding/use of 3 energy conservation strategies during ADL.  Plan Discharge plan needs to be updated    Co-evaluation                 AM-PAC OT "6 Clicks" Daily Activity     Outcome Measure   Help from another person eating meals?: None Help from another person taking care of personal grooming?: A Little Help from another person toileting, which includes using toliet, bedpan, or urinal?: A Little Help from another person bathing (including washing, rinsing, drying)?: A Little Help from another person to put on and taking off regular upper body clothing?: A Little Help from another person to put on and taking off regular lower body clothing?: A Little 6 Click Score: 19    End of Session Equipment Utilized During Treatment: Other (comment);Rolling walker  OT Visit Diagnosis: Unsteadiness on feet (R26.81);Other abnormalities of gait and mobility (R26.89);Muscle weakness (generalized) (M62.81);Other symptoms and signs involving cognitive function   Activity Tolerance Patient limited by fatigue   Patient Left in bed;with call bell/phone within reach;with family/visitor present   Nurse Communication Mobility status(pt's cut on hand started bleeding)        Time: 3361-2244 OT Time Calculation (min): 17 min  Charges: OT General Charges $OT Visit: 1 Visit OT Treatments $Self Care/Home Management : 8-22 mins  Dorinda Hill OTR/L Acute Rehabilitation Services Office: [place] 05/14/2018, 2:29 PM

## 2018-05-24 ENCOUNTER — Other Ambulatory Visit: Payer: Self-pay | Admitting: *Deleted

## 2018-05-24 NOTE — Patient Outreach (Signed)
[facility]) Care Management  05/24/2018  SANCHEZ HEMMER [birth year] [id number]   Onsite visit to facility.  Attempted to meet with patient, he was not in his room. Per SW, Judson Roch, patient will return home, he lives with son.   Plan to monitor for any [facility] care management needs.  Royetta Crochet. Laymond Purser, MSN, RN, [employer] , [place] [phone]) Business Cell  [phone]) Toll Free Office

## 2018-05-30 ENCOUNTER — Other Ambulatory Visit: Payer: Self-pay

## 2018-05-30 NOTE — Patient Outreach (Signed)
[facility]) Care Management  05/30/2018  [name] [birth year] [id number]    EMMI-General Discharge RED ON EMMI ALERT Day # 1 Date: 05/29/2018 Red Alert Reason: "Transportation to follow up? No  New prescriptions? I don't know"   Outreach attempt # 1 to patient. Spoke with patient's son he is POA. Reviewed and addressed red alerts. He voices that patient has some dementia and probably did not understand questions. Son states that he is recovering from surgery but has supportive family who is assisting him and patient. He voices patient will have transportation to appts. Son reports that patient sees all MDs at [facility] except for cardiology. He confirms that patient has all meds and no issues or concerns regarding them. He denies any further RN CM needs or concerns at this time.      Plan: RN CM will close case as no further interventions needed at this time.   Enzo Montgomery, RN,BSN,CCM [place] Management Telephonic Care Management Coordinator Direct Phone: [phone] Toll Free: [phone] Fax: [phone]

## 2019-02-25 ENCOUNTER — Encounter (HOSPITAL_COMMUNITY): Payer: Self-pay | Admitting: Emergency Medicine

## 2019-02-25 ENCOUNTER — Emergency Department (HOSPITAL_COMMUNITY): Payer: Medicare Other

## 2019-02-25 ENCOUNTER — Inpatient Hospital Stay (HOSPITAL_COMMUNITY): Payer: Medicare Other

## 2019-02-25 ENCOUNTER — Inpatient Hospital Stay (HOSPITAL_COMMUNITY)
Admission: EM | Admit: 2019-02-25 | Discharge: 2019-03-05 | DRG: 190 | Disposition: A | Discharge status: Home Health | Payer: Medicare Other | Attending: Internal Medicine | Admitting: Internal Medicine

## 2019-02-25 ENCOUNTER — Other Ambulatory Visit: Payer: Self-pay

## 2019-02-25 DIAGNOSIS — K219 Gastro-esophageal reflux disease without esophagitis: Secondary | ICD-10-CM | POA: Diagnosis present

## 2019-02-25 DIAGNOSIS — I480 Paroxysmal atrial fibrillation: Secondary | ICD-10-CM | POA: Diagnosis not present

## 2019-02-25 DIAGNOSIS — I10 Essential (primary) hypertension: Secondary | ICD-10-CM | POA: Diagnosis present

## 2019-02-25 DIAGNOSIS — Z87891 Personal history of nicotine dependence: Secondary | ICD-10-CM

## 2019-02-25 DIAGNOSIS — I493 Ventricular premature depolarization: Secondary | ICD-10-CM | POA: Diagnosis present

## 2019-02-25 DIAGNOSIS — I5033 Acute on chronic diastolic (congestive) heart failure: Secondary | ICD-10-CM | POA: Diagnosis not present

## 2019-02-25 DIAGNOSIS — Z9049 Acquired absence of other specified parts of digestive tract: Secondary | ICD-10-CM

## 2019-02-25 DIAGNOSIS — E785 Hyperlipidemia, unspecified: Secondary | ICD-10-CM | POA: Diagnosis present

## 2019-02-25 DIAGNOSIS — J441 Chronic obstructive pulmonary disease with (acute) exacerbation: Principal | ICD-10-CM | POA: Diagnosis present

## 2019-02-25 DIAGNOSIS — W19XXXA Unspecified fall, initial encounter: Secondary | ICD-10-CM | POA: Diagnosis present

## 2019-02-25 DIAGNOSIS — I361 Nonrheumatic tricuspid (valve) insufficiency: Secondary | ICD-10-CM | POA: Diagnosis not present

## 2019-02-25 DIAGNOSIS — J9611 Chronic respiratory failure with hypoxia: Secondary | ICD-10-CM | POA: Diagnosis present

## 2019-02-25 DIAGNOSIS — Z8261 Family history of arthritis: Secondary | ICD-10-CM

## 2019-02-25 DIAGNOSIS — J9621 Acute and chronic respiratory failure with hypoxia: Secondary | ICD-10-CM | POA: Diagnosis present

## 2019-02-25 DIAGNOSIS — D513 Other dietary vitamin B12 deficiency anemia: Secondary | ICD-10-CM | POA: Diagnosis present

## 2019-02-25 DIAGNOSIS — I2510 Atherosclerotic heart disease of native coronary artery without angina pectoris: Secondary | ICD-10-CM | POA: Diagnosis present

## 2019-02-25 DIAGNOSIS — Z8249 Family history of ischemic heart disease and other diseases of the circulatory system: Secondary | ICD-10-CM

## 2019-02-25 DIAGNOSIS — R0602 Shortness of breath: Secondary | ICD-10-CM | POA: Diagnosis not present

## 2019-02-25 DIAGNOSIS — Z20828 Contact with and (suspected) exposure to other viral communicable diseases: Secondary | ICD-10-CM | POA: Diagnosis present

## 2019-02-25 DIAGNOSIS — J449 Chronic obstructive pulmonary disease, unspecified: Secondary | ICD-10-CM | POA: Diagnosis present

## 2019-02-25 DIAGNOSIS — Z955 Presence of coronary angioplasty implant and graft: Secondary | ICD-10-CM | POA: Diagnosis not present

## 2019-02-25 DIAGNOSIS — J189 Pneumonia, unspecified organism: Secondary | ICD-10-CM

## 2019-02-25 DIAGNOSIS — R55 Syncope and collapse: Secondary | ICD-10-CM | POA: Diagnosis present

## 2019-02-25 DIAGNOSIS — N179 Acute kidney failure, unspecified: Secondary | ICD-10-CM

## 2019-02-25 DIAGNOSIS — F0390 Unspecified dementia without behavioral disturbance: Secondary | ICD-10-CM | POA: Diagnosis present

## 2019-02-25 DIAGNOSIS — Z951 Presence of aortocoronary bypass graft: Secondary | ICD-10-CM | POA: Diagnosis not present

## 2019-02-25 DIAGNOSIS — Z87442 Personal history of urinary calculi: Secondary | ICD-10-CM

## 2019-02-25 DIAGNOSIS — D518 Other vitamin B12 deficiency anemias: Secondary | ICD-10-CM | POA: Diagnosis present

## 2019-02-25 DIAGNOSIS — Z66 Do not resuscitate: Secondary | ICD-10-CM | POA: Diagnosis present

## 2019-02-25 DIAGNOSIS — Z800 Family history of malignant neoplasm of digestive organs: Secondary | ICD-10-CM

## 2019-02-25 DIAGNOSIS — R0600 Dyspnea, unspecified: Secondary | ICD-10-CM | POA: Diagnosis present

## 2019-02-25 DIAGNOSIS — G8929 Other chronic pain: Secondary | ICD-10-CM | POA: Diagnosis present

## 2019-02-25 DIAGNOSIS — I110 Hypertensive heart disease with heart failure: Secondary | ICD-10-CM | POA: Diagnosis present

## 2019-02-25 DIAGNOSIS — I252 Old myocardial infarction: Secondary | ICD-10-CM | POA: Diagnosis not present

## 2019-02-25 DIAGNOSIS — I420 Dilated cardiomyopathy: Secondary | ICD-10-CM | POA: Diagnosis present

## 2019-02-25 DIAGNOSIS — I6529 Occlusion and stenosis of unspecified carotid artery: Secondary | ICD-10-CM | POA: Diagnosis present

## 2019-02-25 DIAGNOSIS — E1151 Type 2 diabetes mellitus with diabetic peripheral angiopathy without gangrene: Secondary | ICD-10-CM | POA: Diagnosis present

## 2019-02-25 DIAGNOSIS — Z79899 Other long term (current) drug therapy: Secondary | ICD-10-CM

## 2019-02-25 DIAGNOSIS — Z79890 Hormone replacement therapy (postmenopausal): Secondary | ICD-10-CM

## 2019-02-25 DIAGNOSIS — F419 Anxiety disorder, unspecified: Secondary | ICD-10-CM | POA: Diagnosis present

## 2019-02-25 DIAGNOSIS — I4891 Unspecified atrial fibrillation: Secondary | ICD-10-CM | POA: Diagnosis present

## 2019-02-25 DIAGNOSIS — F17200 Nicotine dependence, unspecified, uncomplicated: Secondary | ICD-10-CM | POA: Diagnosis present

## 2019-02-25 DIAGNOSIS — Z9981 Dependence on supplemental oxygen: Secondary | ICD-10-CM

## 2019-02-25 DIAGNOSIS — M810 Age-related osteoporosis without current pathological fracture: Secondary | ICD-10-CM | POA: Diagnosis present

## 2019-02-25 DIAGNOSIS — Z5329 Procedure and treatment not carried out because of patient's decision for other reasons: Secondary | ICD-10-CM | POA: Diagnosis present

## 2019-02-25 LAB — CBC WITH DIFFERENTIAL/PLATELET
Abs Immature Granulocytes: 0.08 10*3/uL — ABNORMAL HIGH (ref 0.00–0.07)
Basophils Absolute: 0 10*3/uL (ref 0.0–0.1)
Basophils Relative: 0 %
Eosinophils Absolute: 0.1 10*3/uL (ref 0.0–0.5)
Eosinophils Relative: 1 %
HCT: 43.8 % (ref 39.0–52.0)
Hemoglobin: 14.1 g/dL (ref 13.0–17.0)
Immature Granulocytes: 1 %
Lymphocytes Relative: 4 %
Lymphs Abs: 0.4 10*3/uL — ABNORMAL LOW (ref 0.7–4.0)
MCH: 33.1 pg (ref 26.0–34.0)
MCHC: 32.2 g/dL (ref 30.0–36.0)
MCV: 102.8 fL — ABNORMAL HIGH (ref 80.0–100.0)
Monocytes Absolute: 0.7 10*3/uL (ref 0.1–1.0)
Monocytes Relative: 8 %
Neutro Abs: 7.8 10*3/uL — ABNORMAL HIGH (ref 1.7–7.7)
Neutrophils Relative %: 86 %
Platelets: 216 10*3/uL (ref 150–400)
RBC: 4.26 MIL/uL (ref 4.22–5.81)
RDW: 13.6 % (ref 11.5–15.5)
WBC: 9.1 10*3/uL (ref 4.0–10.5)
nRBC: 0 % (ref 0.0–0.2)

## 2019-02-25 LAB — CBC
HCT: 46.2 % (ref 39.0–52.0)
Hemoglobin: 15 g/dL (ref 13.0–17.0)
MCH: 33 pg (ref 26.0–34.0)
MCHC: 32.5 g/dL (ref 30.0–36.0)
MCV: 101.8 fL — ABNORMAL HIGH (ref 80.0–100.0)
Platelets: 202 10*3/uL (ref 150–400)
RBC: 4.54 MIL/uL (ref 4.22–5.81)
RDW: 13.7 % (ref 11.5–15.5)
WBC: 6.7 10*3/uL (ref 4.0–10.5)
nRBC: 0 % (ref 0.0–0.2)

## 2019-02-25 LAB — URINALYSIS, ROUTINE W REFLEX MICROSCOPIC
Bilirubin Urine: NEGATIVE
Glucose, UA: NEGATIVE mg/dL
Ketones, ur: 5 mg/dL — AB
Leukocytes,Ua: NEGATIVE
Nitrite: NEGATIVE
Protein, ur: 100 mg/dL — AB
RBC / HPF: >50 RBC/hpf — ABNORMAL HIGH (ref 0–5)
Specific Gravity, Urine: 1.017 (ref 1.005–1.030)
pH: 5 (ref 5.0–8.0)

## 2019-02-25 LAB — HEMOGLOBIN A1C
Hgb A1c MFr Bld: 6.7 % — ABNORMAL HIGH (ref 4.8–5.6)
Mean Plasma Glucose: 145.59 mg/dL

## 2019-02-25 LAB — CREATININE, SERUM
Creatinine, Ser: 1.07 mg/dL (ref 0.61–1.24)
GFR calc Af Amer: >60 mL/min (ref >60)
GFR calc non Af Amer: >60 mL/min (ref >60)

## 2019-02-25 LAB — BASIC METABOLIC PANEL
Anion gap: 9 (ref 5–15)
BUN: 15 mg/dL (ref 8–23)
CO2: 31 mmol/L (ref 22–32)
Calcium: 9.1 mg/dL (ref 8.9–10.3)
Chloride: 101 mmol/L (ref 98–111)
Creatinine, Ser: 1.15 mg/dL (ref 0.61–1.24)
GFR calc Af Amer: >60 mL/min (ref >60)
GFR calc non Af Amer: >60 mL/min (ref >60)
Glucose, Bld: 153 mg/dL — ABNORMAL HIGH (ref 70–99)
Potassium: 3.9 mmol/L (ref 3.5–5.1)
Sodium: 141 mmol/L (ref 135–145)

## 2019-02-25 LAB — GLUCOSE, CAPILLARY: Glucose-Capillary: 151 mg/dL — ABNORMAL HIGH (ref 70–99)

## 2019-02-25 LAB — TROPONIN I (HIGH SENSITIVITY)
Troponin I (High Sensitivity): 29 ng/L — ABNORMAL HIGH (ref <18)
Troponin I (High Sensitivity): 60 ng/L — ABNORMAL HIGH (ref <18)
Troponin I (High Sensitivity): 49 ng/L — ABNORMAL HIGH (ref <18)

## 2019-02-25 LAB — HIV ANTIBODY (ROUTINE TESTING W REFLEX): HIV Screen 4th Generation wRfx: NONREACTIVE

## 2019-02-25 LAB — CBG MONITORING, ED
Glucose-Capillary: 162 mg/dL — ABNORMAL HIGH (ref 70–99)
Glucose-Capillary: 69 mg/dL — ABNORMAL LOW (ref 70–99)
Glucose-Capillary: 160 mg/dL — ABNORMAL HIGH (ref 70–99)
Glucose-Capillary: 95 mg/dL (ref 70–99)

## 2019-02-25 LAB — BRAIN NATRIURETIC PEPTIDE: B Natriuretic Peptide: 205.7 pg/mL — ABNORMAL HIGH (ref 0.0–100.0)

## 2019-02-25 LAB — STREP PNEUMONIAE URINARY ANTIGEN: Strep Pneumo Urinary Antigen: NEGATIVE

## 2019-02-25 LAB — POC SARS CORONAVIRUS 2 AG -  ED: SARS Coronavirus 2 Ag: NEGATIVE

## 2019-02-25 MED ORDER — GUAIFENESIN 400 MG PO TABS: 400.0000 mg | ORAL_TABLET | Freq: Two times a day (BID) | ORAL | Status: DC

## 2019-02-25 MED ORDER — SODIUM CHLORIDE 0.9 % IV SOLN
1.0000 g | Freq: Once | INTRAVENOUS | Status: AC
  Administered 2019-02-25: 1 g via INTRAVENOUS
  Filled 2019-02-25: qty 10

## 2019-02-25 MED ORDER — MEMANTINE HCL 10 MG PO TABS
20.0000 mg | ORAL_TABLET | Freq: Every day | ORAL | Status: DC
  Administered 2019-02-26 – 2019-03-04 (×8): 20 mg via ORAL
  Filled 2019-02-25 (×9): qty 2

## 2019-02-25 MED ORDER — SODIUM CHLORIDE 0.9 % IV SOLN
500.0000 mg | INTRAVENOUS | Status: DC
  Administered 2019-02-26 – 2019-02-27 (×2): 500 mg via INTRAVENOUS
  Filled 2019-02-25 (×2): qty 500

## 2019-02-25 MED ORDER — INSULIN ASPART 100 UNIT/ML ~~LOC~~ SOLN
0.0000 [IU] | Freq: Three times a day (TID) | SUBCUTANEOUS | Status: DC
  Administered 2019-02-25: 3 [IU] via SUBCUTANEOUS
  Administered 2019-02-26: 2 [IU] via SUBCUTANEOUS
  Administered 2019-02-27: 5 [IU] via SUBCUTANEOUS

## 2019-02-25 MED ORDER — BUDESONIDE 0.5 MG/2ML IN SUSP
0.5000 mg | Freq: Two times a day (BID) | RESPIRATORY_TRACT | Status: DC
  Administered 2019-02-26 – 2019-03-05 (×14): 0.5 mg via RESPIRATORY_TRACT
  Filled 2019-02-25 (×17): qty 2

## 2019-02-25 MED ORDER — DILTIAZEM HCL ER COATED BEADS 180 MG PO CP24
180.0000 mg | ORAL_CAPSULE | Freq: Every morning | ORAL | Status: DC
  Administered 2019-02-25 – 2019-03-05 (×9): 180 mg via ORAL
  Filled 2019-02-25 (×9): qty 1

## 2019-02-25 MED ORDER — IPRATROPIUM-ALBUTEROL 0.5-2.5 (3) MG/3ML IN SOLN
RESPIRATORY_TRACT | Status: AC
  Administered 2019-02-25: 3 mL
  Filled 2019-02-25: qty 3

## 2019-02-25 MED ORDER — GUAIFENESIN 200 MG PO TABS
400.0000 mg | ORAL_TABLET | Freq: Two times a day (BID) | ORAL | Status: DC
  Administered 2019-02-25 – 2019-03-05 (×16): 400 mg via ORAL
  Filled 2019-02-25 (×17): qty 2

## 2019-02-25 MED ORDER — SODIUM CHLORIDE 0.9 % IV SOLN
500.0000 mg | Freq: Once | INTRAVENOUS | Status: AC
  Administered 2019-02-25: 500 mg via INTRAVENOUS
  Filled 2019-02-25: qty 500

## 2019-02-25 MED ORDER — MELATONIN 3 MG PO TABS
6.0000 mg | ORAL_TABLET | Freq: Every day | ORAL | Status: DC
  Administered 2019-02-26 – 2019-03-04 (×8): 6 mg via ORAL
  Filled 2019-02-25 (×9): qty 2

## 2019-02-25 MED ORDER — INSULIN ASPART 100 UNIT/ML ~~LOC~~ SOLN: 0.0000 [IU] | Freq: Every day | SUBCUTANEOUS | Status: DC

## 2019-02-25 MED ORDER — IPRATROPIUM-ALBUTEROL 0.5-2.5 (3) MG/3ML IN SOLN
3.0000 mL | RESPIRATORY_TRACT | Status: DC
  Administered 2019-02-25: 3 mL via RESPIRATORY_TRACT

## 2019-02-25 MED ORDER — ARFORMOTEROL TARTRATE 15 MCG/2ML IN NEBU
15.0000 ug | INHALATION_SOLUTION | Freq: Two times a day (BID) | RESPIRATORY_TRACT | Status: DC
  Administered 2019-02-26 – 2019-03-04 (×13): 15 ug via RESPIRATORY_TRACT
  Filled 2019-02-25 (×15): qty 2

## 2019-02-25 MED ORDER — SODIUM CHLORIDE 0.9 % IV SOLN
1.0000 g | INTRAVENOUS | Status: AC
  Administered 2019-02-26 – 2019-03-01 (×4): 1 g via INTRAVENOUS
  Filled 2019-02-25 (×5): qty 10

## 2019-02-25 MED ORDER — ATORVASTATIN CALCIUM 40 MG PO TABS
40.0000 mg | ORAL_TABLET | Freq: Every day | ORAL | Status: DC
  Administered 2019-02-26 – 2019-03-04 (×8): 40 mg via ORAL
  Filled 2019-02-25 (×9): qty 1

## 2019-02-25 MED ORDER — ENOXAPARIN SODIUM 40 MG/0.4ML ~~LOC~~ SOLN
40.0000 mg | SUBCUTANEOUS | Status: DC
  Administered 2019-02-25 – 2019-03-04 (×8): 40 mg via SUBCUTANEOUS
  Filled 2019-02-25 (×8): qty 0.4

## 2019-02-25 MED ORDER — PANTOPRAZOLE SODIUM 40 MG PO TBEC
40.0000 mg | DELAYED_RELEASE_TABLET | Freq: Two times a day (BID) | ORAL | Status: DC
  Administered 2019-02-25 – 2019-03-05 (×16): 40 mg via ORAL
  Filled 2019-02-25 (×17): qty 1

## 2019-02-25 MED ORDER — FUROSEMIDE 20 MG PO TABS
20.0000 mg | ORAL_TABLET | Freq: Every morning | ORAL | Status: DC
  Administered 2019-02-25 – 2019-02-28 (×4): 20 mg via ORAL
  Filled 2019-02-25 (×4): qty 1

## 2019-02-25 MED ORDER — ALBUTEROL SULFATE HFA 108 (90 BASE) MCG/ACT IN AERS
2.0000 | INHALATION_SPRAY | Freq: Once | RESPIRATORY_TRACT | Status: AC
  Administered 2019-02-25: 2 via RESPIRATORY_TRACT
  Filled 2019-02-25: qty 6.7

## 2019-02-25 MED ORDER — FERROUS GLUCONATE 324 (38 FE) MG PO TABS
324.0000 mg | ORAL_TABLET | Freq: Two times a day (BID) | ORAL | Status: DC
  Administered 2019-02-25 – 2019-03-05 (×16): 324 mg via ORAL
  Filled 2019-02-25 (×16): qty 1

## 2019-02-25 MED ORDER — CLONAZEPAM 0.5 MG PO TABS
0.5000 mg | ORAL_TABLET | Freq: Every day | ORAL | Status: DC
  Administered 2019-02-26 – 2019-03-04 (×8): 0.5 mg via ORAL
  Filled 2019-02-25 (×9): qty 1

## 2019-02-25 NOTE — ED Notes (Signed)
Ordered diet tray 

## 2019-02-25 NOTE — ED Triage Notes (Signed)
Pt here [facility] from home w/co sob 2l [place] 97% baseline. Breathing tx this morning. Fell this morning with no injuries.

## 2019-02-25 NOTE — ED Notes (Signed)
Cheese and crackers provided to pt

## 2019-02-25 NOTE — ED Notes (Signed)
Pt attempting to leave bed, states that it is time for him to go. Easily redirected and assisted back to a safe position.

## 2019-02-25 NOTE — ED Provider Notes (Signed)
[facility] EMERGENCY DEPARTMENT Provider Note   CSN: [id number] Arrival date & time: 02/25/19  [id number]     History   Chief Complaint No chief complaint on file.   HPI [name] is a 78 y.o. male with a past medical history of COPD on 2 L of oxygen via nasal cannula at baseline, hypertension, CHF, CAD status post CABG in 2011 presenting to the ED with a chief complaint of shortness of breath.  Gradually worsening shortness of breath for the past 3 months that has worsened today.  He tried to give himself a breathing treatment with his nebulizer machine last night but was unable to do so because the machine stopped working.  States that he has not had to use his rescue inhaler for the past several months.  States that this feels similar to his prior COPD flareups, he is coughing up white mucus.  Denies any chest pain, fever, sick contacts with similar symptoms, leg swelling.     HPI  Past Medical History:  Diagnosis Date  . Acute kidney injury ([place]) 05/10/2018  . Alcohol abuse   . Allergic rhinitis   . ANEMIA, B12 DEFICIENCY 12/02/2008  . Arthritis   . CAD (coronary artery disease)    a.  s/p CABG 2011.  Marland Kitchen CAP (community acquired pneumonia) 04/03/2014  . CAROTID ARTERY STENOSIS 04/16/2009  . Cataract   . Chronic diastolic CHF (congestive heart failure) ([place])   . Chronic respiratory failure ([place])   . COLONIC POLYPS 05/21/2007  . Compression fracture of L1 lumbar vertebra (HCC)   . COPD with acute exacerbation ([place]) 06/10/2016  . COPD, severe ([place])   . Diabetes mellitus without complication ([place])   . DIVERTICULAR DISEASE 05/21/2007  . Dysrhythmia   . Essential hypertension   . Falls   . GERD (gastroesophageal reflux disease)   . GI BLEEDING 09/09/2008  . HCAP (healthcare-associated pneumonia)   . Hemoptysis   . HIATAL HERNIA 05/21/2007  . History of kidney stones   . HYPERLIPIDEMIA 11/21/2006  . Hypertension 08/23/2014  . Internal hemorrhoids   . Low back pain  08/23/2014  . MYOCARDIAL INFARCTION, HX OF 11/21/2006  . NEPHROLITHIASIS 05/21/2007  . Normocytic anemia   . NSVT (nonsustained ventricular tachycardia) ([place])    a. Holter 2015: NSR, A. Fib/flutter, short runs of NSVT  . Olecranon bursitis 07/09/2014   Required surgery and 6 day hospitalization. Some chronic pain at L elbow.    . OSTEOPOROSIS 11/21/2006  . Osteoporosis   . Paroxysmal atrial fibrillation ([place])    a. Anticoag stopped due to falls, rectal bleeding.  . Paroxysmal atrial flutter ([place])   . Pneumonia   . Pulmonary nodule    a. Dx 01/2015 -> advised to f/u PCP for PET.  . Pulmonary nodules    No pulmonary nodules on CTA Chest 07/2017  . PVD (peripheral vascular disease) ([place])    a. s/p prior iliac stenting.  . Syncope 08/23/2014    Patient Active Problem List   Diagnosis Date Noted  . Pulmonary hypertension ([place]) 05/13/2018  . Acute delirium   . Shortness of breath 05/11/2018  . Acute kidney injury ([place]) 05/10/2018  . Hematuria, microscopic 05/10/2018  . Dehydration 05/10/2018  . Abdominal aortic aneurysm ([place]) 05/10/2018  . Lung nodule, solitary   . Iron deficiency anemia 02/15/2015  . COPD (chronic obstructive pulmonary disease) ([place]) 08/23/2014  . Chronic atrial fibrillation 08/23/2014  . Diabetes mellitus type II, controlled ([place]) 07/01/2014  . Acute on  chronic respiratory failure ([place]) 05/14/2014  . COPD exacerbation ([place])   . Chronic respiratory failure with hypercapnia ([place]) 05/10/2014  . CAP (community acquired pneumonia) 04/03/2014  . Allergic rhinitis 02/06/2014  . GERD (gastroesophageal reflux disease) 02/06/2014  . Do not resuscitate 02/06/2014  . Erectile dysfunction 06/18/2010  . Atrial fibrillation ([place]) 11/06/2009  . Peripheral vascular disease ([place]) 09/02/2009  . Carotid artery stenosis 04/16/2009  . ANEMIA, B12 DEFICIENCY 12/02/2008  . TOBACCO USE 01/19/2007  . Hyperlipidemia 11/21/2006  . Essential hypertension 11/21/2006  . Hx of CABG  11/21/2006  . Osteoporosis 11/21/2006    Past Surgical History:  Procedure Laterality Date  . APPENDECTOMY    . CARDIOVERSION N/A 04/16/2014   Procedure: CARDIOVERSION;  Surgeon: Josue Hector, MD;  Location: [facility]COPY;  Service: Cardiovascular;  Laterality: N/A;  . CHOLECYSTECTOMY    . CORONARY ARTERY BYPASS GRAFT  2011  . CORONARY ARTERY BYPASS GRAFT     x 5  . CORONARY STENT PLACEMENT     03/07/2001  . HEMORRHOID SURGERY    . INGUINAL HERNIA REPAIR  10/10/08  . MASS EXCISION Left 07/09/2014   Procedure: LEFT ELBOW BURSECTOMY EXCISION MASS;  Surgeon: Iran Planas, MD;  Location: [facility];  Service: Orthopedics;  Laterality: Left;  . OLECRANON BURSECTOMY  07/09/2014  . PTCA    . THYROIDECTOMY     partial        Home Medications    Prior to Admission medications   Medication Sig Start Date End Date Taking? Authorizing Provider  albuterol (PROAIR HFA) 108 (90 Base) MCG/ACT inhaler Inhale 2 puffs into the lungs every 4 (four) hours as needed for wheezing or shortness of breath.     [provider]  alum & mag hydroxide-simeth (MAALOX/MYLANTA) 200-200-20 MG/5ML suspension Take 15 mLs by mouth 3 (three) times daily.    [provider]  atorvastatin (LIPITOR) 40 MG tablet Take 40 mg by mouth daily.    [provider]  benzonatate (TESSALON) 100 MG capsule Take 1 capsule (100 mg total) by mouth 3 (three) times daily. Patient not taking: Reported on 05/10/2018 03/19/18   Lavina Hamman, MD  Calcium Carbonate-Vit D-Min (CALTRATE 600+D PLUS) 600-400 MG-UNIT per tablet Chew 1 tablet by mouth 2 (two) times daily.     [provider]  cefdinir (OMNICEF) 300 MG capsule Take 1 capsule (300 mg total) by mouth every 12 (twelve) hours. 05/14/18   Bonnita Hollow, MD  cetirizine (ZYRTEC ALLERGY) 10 MG tablet Take 1 tablet (10 mg total) by mouth every morning. 08/17/10   Parrett, Fonnie Mu, NP  diltiazem (CARDIZEM CD) 180 MG 24 hr capsule Take 180 mg by mouth  daily.  01/21/16   Sherren Mocha, MD  feeding supplement, GLUCERNA SHAKE, (GLUCERNA SHAKE) LIQD Take 237 mLs by mouth 3 (three) times daily between meals. Patient not taking: Reported on 05/10/2018 06/14/16   Molt, Bethany, DO  furosemide (LASIX) 20 MG tablet Take 20 mg by mouth daily.    [provider]  ipratropium-albuterol (DUONEB) 0.5-2.5 (3) MG/3ML SOLN Take 3 mLs by nebulization 4 (four) times daily. Patient not taking: Reported on 05/10/2018 03/19/18   Lavina Hamman, MD  LACTOBACILLUS PO Take 2 capsules by mouth daily.    [provider]  loperamide (IMODIUM) 2 MG capsule Take 2 mg by mouth as needed for diarrhea or loose stools.     [provider]  Melatonin 3 MG TABS Take 6 mg by mouth at bedtime.  [provider]  memantine (NAMENDA) 10 MG tablet Take 5-20 mg by mouth See admin instructions. Take one-half tablet for 4 weeks, then one tablet for 4 weeks, then one and one-half tablets for 4 weeks then two tablets at bedtime.    [provider]  OXYGEN Inhale 2-4 L into the lungs See admin instructions. Inhale 2 L continuous during the day and 4 L at night for sleep    [provider]  pantoprazole (PROTONIX) 40 MG tablet TAKE 1 TABLET BY MOUTH TWICE A DAY Patient taking differently: Take 40 mg by mouth 2 (two) times daily.  05/19/14   Marin Olp, MD  potassium chloride SA (K-DUR,KLOR-CON) 20 MEQ tablet Take 20 mEq by mouth daily.    [provider]  umeclidinium-vilanterol (ANORO ELLIPTA) 62.5-25 MCG/INH AEPB Inhale 1 puff into the lungs daily. 05/15/18   Bonnita Hollow, MD    Family History Family History  Problem Relation Age of Onset  . Arthritis Mother        rheumatoid  . Heart attack Father   . Heart disease Father   . Heart disease Brother   . Colon cancer Brother 33  . Heart disease Brother   . Stomach cancer Neg Hx     Social History Social History   Tobacco Use  . Smoking status: Former  Smoker    Packs/day: 3.00    Years: 50.00    Pack years: 150.00    Types: Cigarettes    Quit date: 09/25/2009    Years since quitting: 9.4  . Smokeless tobacco: Never Used  Substance Use Topics  . Alcohol use: Yes    Alcohol/week: 6.0 standard drinks    Types: 6 Standard drinks or equivalent per week    Comment: [place] 2-3 a night  . Drug use: No     Allergies   Ticlopidine hcl, Lorazepam, Other, and Prednisone   Review of Systems Review of Systems  Constitutional: Negative for appetite change, chills and fever.  HENT: Negative for ear pain, rhinorrhea, sneezing and sore throat.   Eyes: Negative for photophobia and visual disturbance.  Respiratory: Positive for cough and shortness of breath. Negative for chest tightness and wheezing.   Cardiovascular: Negative for chest pain and palpitations.  Gastrointestinal: Negative for abdominal pain, blood in stool, constipation, diarrhea, nausea and vomiting.  Genitourinary: Negative for dysuria, hematuria and urgency.  Musculoskeletal: Negative for myalgias.  Skin: Negative for rash.  Neurological: Negative for dizziness, weakness and light-headedness.     Physical Exam Updated Vital Signs BP (!) 123/98   Pulse 78   Temp 98 F (36.7 C) (Oral)   Resp (!) 21   Ht 5\' 5"  (1.651 m)   Wt 60.8 kg   SpO2 98%   BMI 22.30 kg/m   Physical Exam Vitals signs and nursing note reviewed.  Constitutional:      General: He is not in acute distress.    Appearance: He is well-developed.     Comments: 2 L of oxygen being delivered via nasal cannula.  HENT:     Head: Normocephalic and atraumatic.     Nose: Nose normal.  Eyes:     General: No scleral icterus.       Right eye: No discharge.        Left eye: No discharge.     Conjunctiva/sclera: Conjunctivae normal.  Neck:     Musculoskeletal: Normal range of motion and neck supple.  Cardiovascular:     Rate and  Rhythm: Normal rate and regular rhythm.     Heart sounds: Normal heart  sounds. No murmur. No friction rub. No gallop.   Pulmonary:     Effort: Pulmonary effort is normal. Tachypnea present. No respiratory distress.     Breath sounds: Normal breath sounds.  Abdominal:     General: Bowel sounds are normal. There is no distension.     Palpations: Abdomen is soft.     Tenderness: There is no abdominal tenderness. There is no guarding.  Musculoskeletal: Normal range of motion.  Skin:    General: Skin is warm and dry.     Findings: No rash.  Neurological:     Mental Status: He is alert.     Motor: No abnormal muscle tone.     Coordination: Coordination normal.      ED Treatments / Results  Labs (all labs ordered are listed, but only abnormal results are displayed) Labs Reviewed  BASIC METABOLIC PANEL - Abnormal; Notable for the following components:      Result Value   Glucose, Bld 153 (*)    All other components within normal limits  CBC WITH DIFFERENTIAL/PLATELET - Abnormal; Notable for the following components:   MCV 102.8 (*)    Neutro Abs 7.8 (*)    Lymphs Abs 0.4 (*)    Abs Immature Granulocytes 0.08 (*)    All other components within normal limits  BRAIN NATRIURETIC PEPTIDE - Abnormal; Notable for the following components:   B Natriuretic Peptide 205.7 (*)    All other components within normal limits  CBG MONITORING, ED - Abnormal; Notable for the following components:   Glucose-Capillary 162 (*)    All other components within normal limits  TROPONIN I (HIGH SENSITIVITY) - Abnormal; Notable for the following components:   Troponin I (High Sensitivity) 29 (*)    All other components within normal limits  POC SARS CORONAVIRUS 2 AG -  ED  TROPONIN I (HIGH SENSITIVITY)    EKG EKG Interpretation  Date/Time:  Monday February 25 2019 09:42:14 EST Ventricular Rate:  96 PR Interval:    QRS Duration: 110 QT Interval:  370 QTC Calculation: 456 R Axis:   -44 Text Interpretation: Atrial fibrillation frequent PVC Left axis deviation RSR' in  V1 or V2, right VCD or RVH Confirmed by Gerlene Fee [phone]) on 02/25/2019 9:49:13 AM   Radiology Dg Chest Portable 1 View  Result Date: 02/25/2019 CLINICAL DATA:  COPD, dyspnea, fall this morning EXAM: PORTABLE CHEST 1 VIEW COMPARISON:  05/11/2018 chest radiograph. FINDINGS: Intact sternotomy wires. CABG clips overlie the mediastinum. Surgical clips in the medial left thoracic inlet. Stable cardiomediastinal silhouette with normal heart size. No pneumothorax. Right costophrenic angle opacity. No left pleural effusion. Hazy right lung base opacity. IMPRESSION: 1. Hazy right lung base opacity, cannot exclude pneumonia. Chest radiograph follow-up advised. 2. Right costophrenic angle opacity suspicious for small right pleural effusion. Electronically Signed   By: Ilona Sorrel M.D.   On: 02/25/2019 10:14    Procedures Procedures (including critical care time)  Medications Ordered in ED Medications  albuterol (VENTOLIN HFA) 108 (90 Base) MCG/ACT inhaler 2 puff (2 puffs Inhalation Given 02/25/19 0300)  cefTRIAXone (ROCEPHIN) 1 g in sodium chloride 0.9 % 100 mL IVPB (0 g Intravenous Stopped 02/25/19 1240)  azithromycin (ZITHROMAX) 500 mg in sodium chloride 0.9 % 250 mL IVPB (0 mg Intravenous Stopped 02/25/19 1222)     Initial Impression / Assessment and Plan / ED Course  I have reviewed the triage  vital signs and the nursing notes.  Pertinent labs & imaging results that were available during my care of the patient were reviewed by me and considered in my medical decision making (see chart for details).  Clinical Course as of Feb 24 1305  Mon Feb 25, 2019  1241 Son is [place], states he can be admitted if that is necessary.  Not listed on med list: Memantadine 10mg  2 tablets po at bedtime Also taking cough syrup   [HK]    Clinical Course User Index [HK] Delia Heady, PA-C       78 year old male with a past medical history of COPD on 2 L of oxygen via nasal cannula at baseline,  hypertension, CHF, CAD status post CABG in 2011 presenting to the ED with a chief complaint of shortness of breath.  Reports gradually worsening shortness of breath for the past 3 months that worsened today.  He was unable to use his nebulizer machine.  Reports coughing up green and white mucus.  Feels similar to prior COPD exacerbations.  Patient tachypneic with accessory muscle usage noted on my exam.  Coarse breath sounds noted throughout lung fields.  Patient's oxygen saturations in low 90s on his usual 2 L.  Work-up significant for mild elevation in troponin, mild elevation in BNP.  Point-of-care Covid test is negative.  Chest x-ray shows right lower lung possible pneumonia.  Patient is declining steroids due to side effects.  Will treat with antibiotics for CAP and admit to hospitalist for further management. Patient initially declining admission.  I spoke to his son, [name] who is his healthcare power of attorney.  He is agreeable to admission as necessary.  States that patient was told that he has a history of "moderate dementia" for which he takes [place] but son states that he is at his mental baseline in regards to his dementia.  He is most concerned about his shortness of breath.  Final Clinical Impressions(s) / ED Diagnoses   Final diagnoses:  Chronic obstructive pulmonary disease with acute exacerbation ([place])  Community acquired pneumonia of right lower lobe of lung    ED Discharge Orders    None     Portions of this note were generated with Dragon dictation software. Dictation errors may occur despite best attempts at proofreading.    Delia Heady, PA-C 02/25/19 1306    Maudie Flakes, MD 02/26/19 [phone]

## 2019-02-25 NOTE — ED Notes (Signed)
This nurse was walking by the room and the PT was standing up naked with the IV lines and EKG lines stretched as far as they could go. Pt stated he was trying to leave. This RN was able to reorient pt and get him back in bed but PT accidentally pulled IV out.

## 2019-02-25 NOTE — H&P (Signed)
History and Physical    [name] [id number] DOB: 1940/11/05 DOA: 02/25/2019  PCP: Reubin Milan, MD  Patient coming from: Home I have personally briefly reviewed patient's old medical records in [place]  Chief Complaint: Shortness of breath and syncopal episode  HPI: [name] is a 78 y.o. male with medical history significant of COPD on 2 L of oxygen via nasal cannula, hypertension, coronary artery disease status post CABG, hyperlipidemia, A. fib, chronic hypoxic respiratory failure due to COPD presents to emergency department due to shortness of breath.  Patient tells me that he is on 2 L of oxygen at home however he continues to have shortness of breath, his son gave him breathing treatment with no improvement.  His son mentioned that he went up on his oxygen from 2 L to 3 L due to his worsening shortness of breath.  Patient tells me that he has gradually worsening of shortness of breath since couple of months.  Reports productive cough with whitish-green sputum, denies fever, chills, wheezing, orthopnea, PND, leg swelling, COVID-19 exposure, chest pain, palpitation, headache, blurry vision, decreased appetite or weight loss.  I called patient's son [name] tells me that patient fell this morning, no loss of consciousness, head trauma, seizure, tongue biting, urinary or bowel incontinence. He is concerned about the stroke so he decided to call EMS and brought patient to the emergency department for further evaluation and management.  Patient lives with his son, he continues to smoke 4 to 5 cigarettes/day, denies alcohol, illicit drug use.  Patient tells me that he does not want systemic steroids as it makes him confused and he has difficulty in sleeping at night-I confirmed this with patient's son.  ED Course: Upon arrival: Patient tachypneic, blood pressure soft, hypoxic-in the 80s was placed on 3 L of oxygen.  Chest x-ray shows hazy right lung base opacity cannot  exclude pneumonia, right costophrenic angle opacity suspicious for small right pleural effusion.  Patient received IV Rocephin and azithromycin in the ED.  Review of Systems: As per HPI otherwise negative.    Past Medical History:  Diagnosis Date  . Acute kidney injury ([place]) 05/10/2018  . Alcohol abuse   . Allergic rhinitis   . ANEMIA, B12 DEFICIENCY 12/02/2008  . Arthritis   . CAD (coronary artery disease)    a.  s/p CABG 2011.  Marland Kitchen CAP (community acquired pneumonia) 04/03/2014  . CAROTID ARTERY STENOSIS 04/16/2009  . Cataract   . Chronic diastolic CHF (congestive heart failure) ([place])   . Chronic respiratory failure ([place])   . COLONIC POLYPS 05/21/2007  . Compression fracture of L1 lumbar vertebra (HCC)   . COPD with acute exacerbation ([place]) 06/10/2016  . COPD, severe ([place])   . Diabetes mellitus without complication ([place])   . DIVERTICULAR DISEASE 05/21/2007  . Dysrhythmia   . Essential hypertension   . Falls   . GERD (gastroesophageal reflux disease)   . GI BLEEDING 09/09/2008  . HCAP (healthcare-associated pneumonia)   . Hemoptysis   . HIATAL HERNIA 05/21/2007  . History of kidney stones   . HYPERLIPIDEMIA 11/21/2006  . Hypertension 08/23/2014  . Internal hemorrhoids   . Low back pain 08/23/2014  . MYOCARDIAL INFARCTION, HX OF 11/21/2006  . NEPHROLITHIASIS 05/21/2007  . Normocytic anemia   . NSVT (nonsustained ventricular tachycardia) ([place])    a. Holter 2015: NSR, A. Fib/flutter, short runs of NSVT  . Olecranon bursitis 07/09/2014   Required surgery and 6 day hospitalization. Some  chronic pain at L elbow.    . OSTEOPOROSIS 11/21/2006  . Osteoporosis   . Paroxysmal atrial fibrillation ([place])    a. Anticoag stopped due to falls, rectal bleeding.  . Paroxysmal atrial flutter ([place])   . Pneumonia   . Pulmonary nodule    a. Dx 01/2015 -> advised to f/u PCP for PET.  . Pulmonary nodules    No pulmonary nodules on CTA Chest 07/2017  . PVD (peripheral vascular disease) ([place])    a. s/p  prior iliac stenting.  . Syncope 08/23/2014    Past Surgical History:  Procedure Laterality Date  . APPENDECTOMY    . CARDIOVERSION N/A 04/16/2014   Procedure: CARDIOVERSION;  Surgeon: Josue Hector, MD;  Location: [facility] ENDOSCOPY;  Service: Cardiovascular;  Laterality: N/A;  . CHOLECYSTECTOMY    . CORONARY ARTERY BYPASS GRAFT  2011  . CORONARY ARTERY BYPASS GRAFT     x 5  . CORONARY STENT PLACEMENT     03/07/2001  . HEMORRHOID SURGERY    . INGUINAL HERNIA REPAIR  10/10/08  . MASS EXCISION Left 07/09/2014   Procedure: LEFT ELBOW BURSECTOMY EXCISION MASS;  Surgeon: Iran Planas, MD;  Location: [facility];  Service: Orthopedics;  Laterality: Left;  . OLECRANON BURSECTOMY  07/09/2014  . PTCA    . THYROIDECTOMY     partial     reports that he quit smoking about 9 years ago. His smoking use included cigarettes. He has a 150.00 pack-year smoking history. He has never used smokeless tobacco. He reports current alcohol use of about 6.0 standard drinks of alcohol per week. He reports that he does not use drugs.  Allergies  Allergen Reactions  . Ticlopidine Hcl Swelling  . Lorazepam Other (See Comments)    Altered mental status from 2 doses of IV Ativan on 05/10/14 HALLUCINATIONS AND DELIRIOUS  . Other Other (See Comments)    Narcotics: Hallucinations and paranoia  . Prednisone Other (See Comments)    NOT TRUE ALLERGY - CONFUSION    Family History  Problem Relation Age of Onset  . Arthritis Mother        rheumatoid  . Heart attack Father   . Heart disease Father   . Heart disease Brother   . Colon cancer Brother 11  . Heart disease Brother   . Stomach cancer Neg Hx     Prior to Admission medications   Medication Sig Start Date End Date Taking? Authorizing Provider  acetaminophen (TYLENOL) 500 MG tablet Take 1,000 mg by mouth every 6 (six) hours as needed for headache (pain).   Yes [provider]  clonazePAM (KLONOPIN) 0.5 MG tablet Take 0.5 mg by mouth at bedtime.   Yes  [provider]  famotidine (PEPCID) 40 MG tablet Take 40 mg by mouth at bedtime.   Yes [provider]  guaifenesin (HUMIBID E) 400 MG TABS tablet Take 400 mg by mouth 2 (two) times daily.   Yes [provider]  guaiFENesin-dextromethorphan (ROBITUSSIN DM) 100-10 MG/5ML syrup Take 5 mLs by mouth 3 (three) times daily as needed for cough.   Yes [provider]  Melatonin 3 MG TABS Take 6 mg by mouth at bedtime.   Yes [provider]  memantine (NAMENDA) 10 MG tablet Take 20 mg by mouth at bedtime.    Yes [provider]  OXYGEN Inhale 3 L into the lungs continuous.    Yes [provider]  alum & mag hydroxide-simeth (MAALOX/MYLANTA) 200-200-20 MG/5ML suspension Take 15  mLs by mouth 3 (three) times daily.    [provider]  atorvastatin (LIPITOR) 40 MG tablet Take 40 mg by mouth daily.    [provider]  benzonatate (TESSALON) 100 MG capsule Take 1 capsule (100 mg total) by mouth 3 (three) times daily. Patient not taking: Reported on 05/10/2018 03/19/18   Lavina Hamman, MD  cetirizine (ZYRTEC ALLERGY) 10 MG tablet Take 1 tablet (10 mg total) by mouth every morning. 08/17/10   Parrett, Fonnie Mu, NP  diltiazem (CARDIZEM CD) 180 MG 24 hr capsule Take 180 mg by mouth daily.  01/21/16   Sherren Mocha, MD  feeding supplement, GLUCERNA SHAKE, (GLUCERNA SHAKE) LIQD Take 237 mLs by mouth 3 (three) times daily between meals. Patient not taking: Reported on 05/10/2018 06/14/16   Molt, Bethany, DO  furosemide (LASIX) 20 MG tablet Take 20 mg by mouth daily.    [provider]  ipratropium-albuterol (DUONEB) 0.5-2.5 (3) MG/3ML SOLN Take 3 mLs by nebulization 4 (four) times daily. Patient not taking: Reported on 05/10/2018 03/19/18   Lavina Hamman, MD  LACTOBACILLUS PO Take 2 capsules by mouth daily.    [provider]  pantoprazole (PROTONIX) 40 MG tablet TAKE 1 TABLET BY MOUTH TWICE A DAY Patient taking  differently: Take 40 mg by mouth 2 (two) times daily.  05/19/14   Marin Olp, MD  potassium chloride SA (K-DUR,KLOR-CON) 20 MEQ tablet Take 20 mEq by mouth daily.    [provider]  umeclidinium-vilanterol (ANORO ELLIPTA) 62.5-25 MCG/INH AEPB Inhale 1 puff into the lungs daily. 05/15/18   Bonnita Hollow, MD    Physical Exam: Vitals:   02/25/19 1215 02/25/19 1230 02/25/19 1300 02/25/19 1316  BP:  129/86 (!) 123/98   Pulse: 97 87 78   Resp: 19 (!) 22 (!) 21   Temp:      TempSrc:      SpO2: 91% 91% 98% 98%  Weight:      Height:        Constitutional: NAD, calm, comfortable Vitals:   02/25/19 1215 02/25/19 1230 02/25/19 1300 02/25/19 1316  BP:  129/86 (!) 123/98   Pulse: 97 87 78   Resp: 19 (!) 22 (!) 21   Temp:      TempSrc:      SpO2: 91% 91% 98% 98%  Weight:      Height:       Eyes: PERRL, lids and conjunctivae normal, appears weak and lethargic, dehydrated ENMT: Mucous membranes are dry. Posterior pharynx clear of any exudate or lesions.Normal dentition.  Neck: normal, supple, no masses, no thyromegaly Respiratory: Mild expiratory wheezing noted, no crackles or rhonchi. Cardiovascular: Regular rate and rhythm, no murmurs / rubs / gallops. No extremity edema. 2+ pedal pulses. No carotid bruits.  Abdomen: no tenderness, no masses palpated. No hepatosplenomegaly. Bowel sounds positive.  Musculoskeletal: no clubbing / cyanosis. No joint deformity upper and lower extremities. Good ROM, no contractures. Normal muscle tone.  Skin: no rashes, lesions, ulcers. No induration Neurologic: CN 2-12 grossly intact. Sensation intact, DTR normal. Strength 5/5 in all 4.  Psychiatric: Normal judgment and insight. Alert and oriented x 3. Normal mood.    Labs on Admission: I have personally reviewed following labs and imaging studies  CBC: Recent Labs  Lab 02/25/19 0954  WBC 9.1  NEUTROABS 7.8*  HGB 14.1  HCT 43.8  MCV 102.8*  PLT 123XX123   Basic Metabolic Panel:  Recent Labs  Lab 02/25/19 0954  NA 141  K 3.9  CL 101  CO2 31  GLUCOSE 153*  BUN 15  CREATININE 1.15  CALCIUM 9.1   GFR: Estimated Creatinine Clearance: 45.5 mL/min (by C-G formula based on SCr of 1.15 mg/dL). Liver Function Tests: No results for input(s): AST, ALT, ALKPHOS, BILITOT, PROT, ALBUMIN in the last 168 hours. No results for input(s): LIPASE, AMYLASE in the last 168 hours. No results for input(s): AMMONIA in the last 168 hours. Coagulation Profile: No results for input(s): INR, PROTIME in the last 168 hours. Cardiac Enzymes: No results for input(s): CKTOTAL, CKMB, CKMBINDEX, TROPONINI in the last 168 hours. BNP (last 3 results) No results for input(s): PROBNP in the last 8760 hours. HbA1C: No results for input(s): HGBA1C in the last 72 hours. CBG: Recent Labs  Lab 02/25/19 0947  GLUCAP 162*   Lipid Profile: No results for input(s): CHOL, HDL, LDLCALC, TRIG, CHOLHDL, LDLDIRECT in the last 72 hours. Thyroid Function Tests: No results for input(s): TSH, T4TOTAL, FREET4, T3FREE, THYROIDAB in the last 72 hours. Anemia Panel: No results for input(s): VITAMINB12, FOLATE, FERRITIN, TIBC, IRON, RETICCTPCT in the last 72 hours. Urine analysis:    Component Value Date/Time   COLORURINE AMBER (A) 05/10/2018 1155   APPEARANCEUR HAZY (A) 05/10/2018 1155   LABSPEC 1.025 05/10/2018 1155   PHURINE 6.0 05/10/2018 1155   GLUCOSEU 150 (A) 05/10/2018 1155   HGBUR LARGE (A) 05/10/2018 1155   HGBUR negative 02/04/2008 1027   BILIRUBINUR NEGATIVE 05/10/2018 1155   KETONESUR NEGATIVE 05/10/2018 1155   PROTEINUR 100 (A) 05/10/2018 1155   UROBILINOGEN 1.0 08/23/2014 1712   NITRITE NEGATIVE 05/10/2018 1155   LEUKOCYTESUR NEGATIVE 05/10/2018 1155    Radiological Exams on Admission: Dg Chest Portable 1 View  Result Date: 02/25/2019 CLINICAL DATA:  COPD, dyspnea, fall this morning EXAM: PORTABLE CHEST 1 VIEW COMPARISON:  05/11/2018 chest radiograph. FINDINGS: Intact sternotomy  wires. CABG clips overlie the mediastinum. Surgical clips in the medial left thoracic inlet. Stable cardiomediastinal silhouette with normal heart size. No pneumothorax. Right costophrenic angle opacity. No left pleural effusion. Hazy right lung base opacity. IMPRESSION: 1. Hazy right lung base opacity, cannot exclude pneumonia. Chest radiograph follow-up advised. 2. Right costophrenic angle opacity suspicious for small right pleural effusion. Electronically Signed   By: Ilona Sorrel M.D.   On: 02/25/2019 10:14    EKG: A. fib with frequent PVCs.  Assessment/Plan Principal Problem:   Shortness of breath Active Problems:   Hyperlipidemia   ANEMIA, B12 DEFICIENCY   TOBACCO USE   Essential hypertension   Hx of CABG   Atrial fibrillation (HCC)   GERD (gastroesophageal reflux disease)   COPD (chronic obstructive pulmonary disease) (HCC)   CAD (coronary artery disease)   Chronic respiratory failure with hypoxia (HCC)    Shortness of breath: -Likely secondary to underlying pneumonia + COPD exacerbation.  Patient presented with shortness of breath and productive cough.  Current smoker.  Required increased amount of oxygen at home. -Reviewed chest x-ray, patient is afebrile with no leukocytosis, initial POC COVID-19 negative, repeat is pending. -Patient received IV Rocephin and azithromycin in the ED -We will admit patient on the floor for close monitoring. -Ordered blood culture, procalcitonin level, urine Legionella antigen, urine strep antigen level, sputum culture. -We will continue same antibiotics.  Patient refused systemic steroids as it makes him confused. -Currently on 2 L of oxygen-which is his baseline.  On continuous pulse ox. -DuoNeb as needed and continued home Brovana and Pulmicort.  Syncopal episode: -CT head came back negative  for acute findings.  Could be secondary to hypoxia?  UA is negative, on telemetry -Check orthostatic vitals, neurochecks -Consulted PT.  Diabetes  mellitus: Hold alogliptin -Start on sliding scale insulin, check A1c -Monitor blood sugar closely.  Paroxysmal A. fib A. fib: Rate controlled -Continue diltiazem. -Monitor heart rate closely.  On telemetry.  History of heart failure with preserved ejection fraction: -Reviewed echo from 08/06/2017-ejection fraction 60 to 65% -Continue Lasix 20 mg daily.  Strict INO's and daily weight. -Monitor electrolytes.  Elevated troponin: -EKG shows A. fib, no ST elevation or depression. -Initial troponin 29 trended up to 60, will trend, patient denies ACS symptoms -Monitor for now.  Dementia/anxiety: -Continue Klonopin and Namenda  GERD: Continue Protonix  Hyperlipidemia: Continue statin  DVT prophylaxis: TED/SCD/Lovenox Code Status: DNR-confirmed with the patient Family Communication: None present at bedside.  Plan of care discussed with patient and his son Mr. Hiroki Hyppolite. over the phone in length and they verbalized understanding and agreed with it. Disposition Plan: TBD Consults called: None Admission status: Inpatient   Mckinley Jewel MD [facility] Pager [phone]  If 7PM-7AM, please contact night-coverage [email or web address] Password [email or web address]  02/25/2019, 1:21 PM

## 2019-02-26 DIAGNOSIS — J189 Pneumonia, unspecified organism: Secondary | ICD-10-CM | POA: Diagnosis not present

## 2019-02-26 DIAGNOSIS — I480 Paroxysmal atrial fibrillation: Secondary | ICD-10-CM

## 2019-02-26 DIAGNOSIS — J441 Chronic obstructive pulmonary disease with (acute) exacerbation: Principal | ICD-10-CM

## 2019-02-26 DIAGNOSIS — J9611 Chronic respiratory failure with hypoxia: Secondary | ICD-10-CM | POA: Diagnosis not present

## 2019-02-26 LAB — BASIC METABOLIC PANEL
Anion gap: 10 (ref 5–15)
BUN: 14 mg/dL (ref 8–23)
CO2: 30 mmol/L (ref 22–32)
Calcium: 8.9 mg/dL (ref 8.9–10.3)
Chloride: 101 mmol/L (ref 98–111)
Creatinine, Ser: 0.92 mg/dL (ref 0.61–1.24)
GFR calc Af Amer: >60 mL/min (ref >60)
GFR calc non Af Amer: >60 mL/min (ref >60)
Glucose, Bld: 99 mg/dL (ref 70–99)
Potassium: 3.5 mmol/L (ref 3.5–5.1)
Sodium: 141 mmol/L (ref 135–145)

## 2019-02-26 LAB — GLUCOSE, CAPILLARY
Glucose-Capillary: 84 mg/dL (ref 70–99)
Glucose-Capillary: 113 mg/dL — ABNORMAL HIGH (ref 70–99)
Glucose-Capillary: 144 mg/dL — ABNORMAL HIGH (ref 70–99)
Glucose-Capillary: 78 mg/dL (ref 70–99)

## 2019-02-26 LAB — CBC
HCT: 41.1 % (ref 39.0–52.0)
Hemoglobin: 13.4 g/dL (ref 13.0–17.0)
MCH: 33.3 pg (ref 26.0–34.0)
MCHC: 32.6 g/dL (ref 30.0–36.0)
MCV: 102.2 fL — ABNORMAL HIGH (ref 80.0–100.0)
Platelets: 196 10*3/uL (ref 150–400)
RBC: 4.02 MIL/uL — ABNORMAL LOW (ref 4.22–5.81)
RDW: 13.7 % (ref 11.5–15.5)
WBC: 6.5 10*3/uL (ref 4.0–10.5)
nRBC: 0 % (ref 0.0–0.2)

## 2019-02-26 LAB — EXPECTORATED SPUTUM ASSESSMENT W GRAM STAIN, RFLX TO RESP C

## 2019-02-26 LAB — MAGNESIUM: Magnesium: 1.9 mg/dL (ref 1.7–2.4)

## 2019-02-26 LAB — SARS CORONAVIRUS 2 (TAT 6-24 HRS): SARS Coronavirus 2: NEGATIVE

## 2019-02-26 MED ORDER — ALBUTEROL SULFATE (2.5 MG/3ML) 0.083% IN NEBU
2.5000 mg | INHALATION_SOLUTION | RESPIRATORY_TRACT | Status: DC | PRN
  Administered 2019-02-27 – 2019-02-28 (×2): 2.5 mg via RESPIRATORY_TRACT
  Filled 2019-02-26 (×2): qty 3

## 2019-02-26 MED ORDER — IPRATROPIUM-ALBUTEROL 0.5-2.5 (3) MG/3ML IN SOLN
3.0000 mL | Freq: Three times a day (TID) | RESPIRATORY_TRACT | Status: DC
  Administered 2019-02-26 – 2019-02-27 (×2): 3 mL via RESPIRATORY_TRACT
  Filled 2019-02-26 (×2): qty 3

## 2019-02-26 MED ORDER — METHYLPREDNISOLONE SODIUM SUCC 125 MG IJ SOLR: 60.0000 mg | Freq: Two times a day (BID) | INTRAMUSCULAR | Status: DC

## 2019-02-26 NOTE — Progress Notes (Signed)
PROGRESS NOTE    [name]  [id number] DOB: [birth year] DOA: 02/25/2019 PCP: Reubin Milan, MD    Brief Narrative:  78 year old male with a history of COPD on 2 L of oxygen, admitted to the hospital with worsening shortness of breath and a syncopal episode.  He was found to have COPD exacerbation and possible pneumonia.  Admitted for further treatments including steroids, antibiotics, bronchodilators.   Assessment & Plan:   Principal Problem:   Shortness of breath Active Problems:   Hyperlipidemia   ANEMIA, B12 DEFICIENCY   TOBACCO USE   Essential hypertension   Hx of CABG   Atrial fibrillation (HCC)   GERD (gastroesophageal reflux disease)   COPD (chronic obstructive pulmonary disease) (HCC)   CAD (coronary artery disease)   Chronic respiratory failure with hypoxia ([place])   1. Acute on chronic respiratory failure secondary to COPD exacerbation.  Patient normally uses 2 L of oxygen at home, but has not required up to 4 L.  We will continue to wean down oxygen as tolerated. 2. COPD exacerbation.  Continues to have diminished air entry bilateral.  Continue on steroids, bronchodilators and antibiotics. 3. Possible pneumonia.  Chest x-ray shows hazy right lung base opacity.  Currently on ceftriaxone and azithromycin.  COVID-19 test is negative.  Urinary antigens for pneumococcus is negative.  Legionella antigen pending. 4. Syncope.  Felt to be related to hypoxia.  CT head negative.  No further symptoms.  Cardiac enzymes were insignificant. 5. Chronic diastolic congestive heart failure.  Currently appears compensated.  Continue home dose of Lasix. 6. Paroxysmal atrial fibrillation.  Currently rate controlled.  Continue on diltiazem.  Not considered to be a candidate for anticoagulation due to fall risk. 7. Diabetes.  Continue on sliding scale insulin.  A1c 6.7.  Blood sugars currently stable 8. Dementia.  Continue Namenda.  Continue Klonopin for anxiety 9. GERD.  Continue  Protonix 10. Hyperlipidemia.  Continue statin   DVT prophylaxis: Lovenox Code Status: DNR Family Communication: None present Disposition Plan: Discharge home once respiratory status has improved   Consultants:     Procedures:     Antimicrobials:   Ceftriaxone 12/1 >  Azithromycin 12/1 >   Subjective: Still feels short of breath.  He is coughing and wheezing.  Having difficulty completing a sentence.  Objective: Vitals:   02/26/19 1649 02/26/19 1924 02/26/19 1928 02/26/19 2303  BP: 114/69  117/70 113/70  Pulse: 67  (!) 51 75  Resp: 16  (!) 25 (!) 21  Temp: 97.6 F (36.4 C)  97.8 F (36.6 C) 98.3 F (36.8 C)  TempSrc: Oral  Oral Oral  SpO2: 100% 98% 98% 95%  Weight:      Height:        Intake/Output Summary (Last 24 hours) at 02/26/2019 2305 Last data filed at 02/26/2019 2305 Gross per 24 hour  Intake 2675.04 ml  Output 1300 ml  Net 1375.04 ml   Filed Weights   02/25/19 0939  Weight: 60.8 kg    Examination:  General exam: Appears calm and comfortable  Respiratory system: Diminished air entry bilaterally.  Increased respiratory effort. Cardiovascular system: S1 & S2 heard, RRR. No JVD, murmurs, rubs, gallops or clicks. No pedal edema. Gastrointestinal system: Abdomen is nondistended, soft and nontender. No organomegaly or masses felt. Normal bowel sounds heard. Central nervous system: Alert and oriented. No focal neurological deficits. Extremities: Symmetric 5 x 5 power. Skin: No rashes, lesions or ulcers Psychiatry: Judgement and insight appear normal. Mood & affect appropriate.  Data Reviewed: I have personally reviewed following labs and imaging studies  CBC: Recent Labs  Lab 02/25/19 0954 02/25/19 1520 02/26/19 0712  WBC 9.1 6.7 6.5  NEUTROABS 7.8*  --   --   HGB 14.1 15.0 13.4  HCT 43.8 46.2 41.1  MCV 102.8* 101.8* 102.2*  PLT 216 202 [id number]   Basic Metabolic Panel: Recent Labs  Lab 02/25/19 0954 02/25/19 1520 02/26/19 0712  NA  141  --  141  K 3.9  --  3.5  CL 101  --  101  CO2 31  --  30  GLUCOSE 153*  --  99  BUN 15  --  14  CREATININE 1.15 1.07 0.92  CALCIUM 9.1  --  8.9  MG  --   --  1.9   GFR: Estimated Creatinine Clearance: 56.9 mL/min (by C-G formula based on SCr of 0.92 mg/dL). Liver Function Tests: No results for input(s): AST, ALT, ALKPHOS, BILITOT, PROT, ALBUMIN in the last 168 hours. No results for input(s): LIPASE, AMYLASE in the last 168 hours. No results for input(s): AMMONIA in the last 168 hours. Coagulation Profile: No results for input(s): INR, PROTIME in the last 168 hours. Cardiac Enzymes: No results for input(s): CKTOTAL, CKMB, CKMBINDEX, TROPONINI in the last 168 hours. BNP (last 3 results) No results for input(s): PROBNP in the last 8760 hours. HbA1C: Recent Labs    02/25/19 1530  HGBA1C 6.7*   CBG: Recent Labs  Lab 02/25/19 2258 02/26/19 0614 02/26/19 1127 02/26/19 1647 02/26/19 2104  GLUCAP 151* 84 113* 144* 78   Lipid Profile: No results for input(s): CHOL, HDL, LDLCALC, TRIG, CHOLHDL, LDLDIRECT in the last 72 hours. Thyroid Function Tests: No results for input(s): TSH, T4TOTAL, FREET4, T3FREE, THYROIDAB in the last 72 hours. Anemia Panel: No results for input(s): VITAMINB12, FOLATE, FERRITIN, TIBC, IRON, RETICCTPCT in the last 72 hours. Sepsis Labs: No results for input(s): PROCALCITON, LATICACIDVEN in the last 168 hours.  Recent Results (from the past 240 hour(s))  Culture, blood (routine x 2) Call MD if unable to obtain prior to antibiotics being given     Status: None (Preliminary result)   Collection Time: 02/25/19  3:19 PM   Specimen: BLOOD LEFT FOREARM  Result Value Ref Range Status   Specimen Description BLOOD LEFT FOREARM  Final   Special Requests   Final    BOTTLES DRAWN AEROBIC AND ANAEROBIC Blood Culture results may not be optimal due to an excessive volume of blood received in culture bottles   Culture   Final    NO GROWTH < 24 HOURS Performed  at [facility], [place] [address], [place], [place] [address]    Report Status PENDING  Incomplete  Culture, blood (routine x 2) Call MD if unable to obtain prior to antibiotics being given     Status: None (Preliminary result)   Collection Time: 02/25/19  3:27 PM   Specimen: BLOOD LEFT WRIST  Result Value Ref Range Status   Specimen Description BLOOD LEFT WRIST  Final   Special Requests   Final    BOTTLES DRAWN AEROBIC AND ANAEROBIC Blood Culture adequate volume   Culture   Final    NO GROWTH < 24 HOURS Performed at [facility], [place] [address] [place], [place] [address]    Report Status PENDING  Incomplete  SARS CORONAVIRUS 2 (TAT 6-24 HRS) Nasopharyngeal Nasopharyngeal Swab     Status: None   Collection Time: 02/25/19  9:15 PM  Specimen: Nasopharyngeal Swab  Result Value Ref Range Status   SARS Coronavirus 2 NEGATIVE NEGATIVE Final    Comment: (NOTE) SARS-CoV-2 target nucleic acids are NOT DETECTED. The SARS-CoV-2 RNA is generally detectable in upper and lower respiratory specimens during the acute phase of infection. Negative results do not preclude SARS-CoV-2 infection, do not rule out co-infections with other pathogens, and should not be used as the sole basis for treatment or other patient management decisions. Negative results must be combined with clinical observations, patient history, and epidemiological information. The expected result is Negative. Fact Sheet for Patients: SugarRoll.be Fact Sheet for Healthcare Providers: https://www.woods-mathews.com/ This test is not yet approved or cleared by the [place] FDA and  has been authorized for detection and/or diagnosis of SARS-CoV-2 by FDA under an Emergency Use Authorization (EUA). This EUA will remain  in effect (meaning this test can be used) for the duration of the COVID-19 declaration under Section 56 4(b)(1) of the Act, 21 U.S.C. section 360bbb-3(b)(1),  unless the authorization is terminated or revoked sooner. Performed at [facility], [place] [address] [place], [place] [address]   Culture, sputum-assessment     Status: None   Collection Time: 02/26/19  2:53 PM   Specimen: Expectorated Sputum  Result Value Ref Range Status   Specimen Description EXPECTORATED SPUTUM  Final   Special Requests NONE  Final   Sputum evaluation   Final    THIS SPECIMEN IS ACCEPTABLE FOR SPUTUM CULTURE Performed at [facility], [place] [address], [place], [place] [address]    Report Status 02/26/2019 FINAL  Final  Culture, respiratory     Status: None (Preliminary result)   Collection Time: 02/26/19  2:53 PM  Result Value Ref Range Status   Specimen Description EXPECTORATED SPUTUM  Final   Special Requests NONE Reflexed from [id number]  Final   Gram Stain   Final    FEW WBC PRESENT, PREDOMINANTLY PMN FEW GRAM POSITIVE COCCI FEW GRAM POSITIVE RODS Performed at [facility], [address], [place], [place] [address]    Culture PENDING  Incomplete   Report Status PENDING  Incomplete         Radiology Studies: Ct Head Wo Contrast  Result Date: 02/25/2019 CLINICAL DATA:  Syncope. EXAM: CT HEAD WITHOUT CONTRAST TECHNIQUE: Contiguous axial images were obtained from the base of the skull through the vertex without intravenous contrast. COMPARISON:  March 15, 2018. FINDINGS: Brain: Mild diffuse cortical atrophy is noted. Mild chronic ischemic white matter disease is noted. No mass effect or midline shift is noted. Ventricular size is within normal limits. There is no evidence of mass lesion, hemorrhage or acute infarction. Vascular: No hyperdense vessel or unexpected calcification. Skull: Normal. Negative for fracture or focal lesion. Sinuses/Orbits: No acute finding. Other: None. IMPRESSION: Mild diffuse cortical atrophy. Mild chronic ischemic white matter disease. No acute intracranial abnormality seen. Electronically Signed   By: Marijo Conception M.D.   On: 02/25/2019 14:25   Dg Chest Portable 1 View  Result Date: 02/25/2019 CLINICAL DATA:  COPD, dyspnea, fall this morning EXAM: PORTABLE CHEST 1 VIEW COMPARISON:  05/11/2018 chest radiograph. FINDINGS: Intact sternotomy wires. CABG clips overlie the mediastinum. Surgical clips in the medial left thoracic inlet. Stable cardiomediastinal silhouette with normal heart size. No pneumothorax. Right costophrenic angle opacity. No left pleural effusion. Hazy right lung base opacity. IMPRESSION: 1. Hazy right lung base opacity, cannot exclude pneumonia. Chest radiograph follow-up advised. 2. Right costophrenic angle opacity suspicious  for small right pleural effusion. Electronically Signed   By: Ilona Sorrel M.D.   On: 02/25/2019 10:14        Scheduled Meds:  arformoterol  15 mcg Nebulization BID   atorvastatin  40 mg Oral QHS   budesonide  0.5 mg Nebulization BID   clonazePAM  0.5 mg Oral QHS   diltiazem  180 mg Oral q morning - 10a   enoxaparin (LOVENOX) injection  40 mg Subcutaneous Q24H   ferrous gluconate  324 mg Oral BID WC   furosemide  20 mg Oral q morning - 10a   guaiFENesin  400 mg Oral BID   insulin aspart  0-15 Units Subcutaneous TID WC   insulin aspart  0-5 Units Subcutaneous QHS   ipratropium-albuterol  3 mL Nebulization TID   Melatonin  6 mg Oral QHS   memantine  20 mg Oral QHS   pantoprazole  40 mg Oral BID   Continuous Infusions:  azithromycin 500 mg (02/26/19 1428)   cefTRIAXone (ROCEPHIN)  IV 1 g (02/26/19 1127)     LOS: 1 day    Time spent: 26mins    Kathie Dike, MD [facility]   If 7PM-7AM, please contact night-coverage [email or web address]  02/26/2019, 11:05 PM

## 2019-02-26 NOTE — Evaluation (Signed)
Physical Therapy Evaluation Patient Details Name: [name] MRN: [id number] DOB: 1940/08/29 Today's Date: 02/26/2019   History of Present Illness  [name] is a 78 y.o. male with medical history significant of COPD on 2 L of oxygen via nasal cannula, hypertension, coronary artery disease status post CABG, hyperlipidemia, A. fib, chronic hypoxic respiratory failure due to COPD presents to emergency department due to shortness of breath. Pt also with confusion and fell morning of presentation. CT of head neg.   Clinical Impression  Pt admitted with above diagnosis. Pt activity limited today due to fatigue and quick O2 desaturation. SpO2 remained mid 90's sitting EOB, dropped into mid 80's with UE motion and returning to supine. Pt remained on 3L O2 throughout. On eval, pt reports knowing that he is confused but also shares a memory of being at a party with an unknown person without his oxygen and then being alone and unable to find his way home. He also expressed fears that someone is trying to do something bad to him. Emphasized to him that we are here to help him and keep him safe.  Pt currently with functional limitations due to the deficits listed below (see PT Problem List). Pt will benefit from skilled PT to increase their independence and safety with mobility to allow discharge to the venue listed below.       Follow Up Recommendations Home health PT;Supervision for mobility/OOB    Equipment Recommendations  None recommended by PT    Recommendations for Other Services       Precautions / Restrictions Precautions Precautions: Other (comment) Precaution Comments: monitor SpO2 closely Restrictions Weight Bearing Restrictions: No      Mobility  Bed Mobility Overal bed mobility: Needs Assistance Bed Mobility: Supine to Sit;Sit to Supine     Supine to sit: Min guard Sit to supine: Min guard   General bed mobility comments: pt able to come to EOB and return to supine without  physical assist but increased time needed and vc's needed to attend to breathing pattern. Cues for pursed lip breathing after he had returned to supine  Transfers Overall transfer level: Needs assistance   Transfers: Lateral/Scoot Transfers          Lateral/Scoot Transfers: Min assist General transfer comment: pt performed partial squat to scoot to [facility] before lying back down, min A for safety  Ambulation/Gait             General Gait Details: NT due to tenuous oxygenation  Stairs            Wheelchair Mobility    Modified Rankin (Stroke Patients Only)       Balance Overall balance assessment: Needs assistance Sitting-balance support: Feet supported;No upper extremity supported Sitting balance-Leahy Scale: Fair                                       Pertinent Vitals/Pain Pain Assessment: No/denies pain    Home Living Family/patient expects to be discharged to:: Private residence Living Arrangements: Children Available Help at Discharge: Family;Available 24 hours/day;Personal care attendant;Available PRN/intermittently Type of Home: House Home Access: Ramped entrance     Home Layout: One level Home Equipment: [place] - 4 wheels;Cane - single point;Grab bars - toilet;Grab bars - tub/shower;Tub bench;Walker - 2 wheels;Bedside commode Additional Comments: On 2.5L O2 at home. Son helps him but cannot physically help much as he is on  disability for scoliosis with multiple back surgeries    Prior Function Level of Independence: Needs assistance   Gait / Transfers Assistance Needed: poor historian on eval. Per last admission, used rollator for longer distances. Reports that he no longer goes out of house except for to the doctor because it tires him out too much     Comments: dementia at baseline but pt also seems to be having hallucinations now, thinks he was at a party before coming into the hospital     Hand Dominance   Dominant Hand:  Right    Extremity/Trunk Assessment   Upper Extremity Assessment Upper Extremity Assessment: Generalized weakness    Lower Extremity Assessment Lower Extremity Assessment: Generalized weakness    Cervical / Trunk Assessment Cervical / Trunk Assessment: Other exceptions Cervical / Trunk Exceptions: increased tension noted in chest with accessory muscle use for breathing  Communication   Communication: HOH  Cognition Arousal/Alertness: Awake/alert Behavior During Therapy: WFL for tasks assessed/performed Overall Cognitive Status: Impaired/Different from baseline Area of Impairment: Memory                     Memory: Decreased short-term memory         General Comments: pt relayed lengthy story of being picked up for a party by an unknown person and then everyone leaving him and him not knowing how to get home and the next thing he knew he was in the hospital. He reports he went to the party without his O2. He also reports that he thinks bad things have happened to him but he's not sure if that's real or not.       General Comments General comments (skin integrity, edema, etc.): SpO2 remained in mid 90's on 3L O2 except when pt raised UE's above head and when he returned to supine, SpO2 dropped into low 80's for <1 min with these activities    Exercises     Assessment/Plan    PT Assessment Patient needs continued PT services  PT Problem List Decreased strength;Decreased activity tolerance;Decreased balance;Decreased mobility;Cardiopulmonary status limiting activity;Decreased cognition       PT Treatment Interventions DME instruction;Gait training;Functional mobility training;Therapeutic activities;Therapeutic exercise;Balance training;Patient/family education;Cognitive remediation    PT Goals (Current goals can be found in the Care Plan section)  Acute Rehab PT Goals Patient Stated Goal: return home PT Goal Formulation: With patient Time For Goal Achievement:  03/12/19 Potential to Achieve Goals: Fair    Frequency Min 3X/week   Barriers to discharge        Co-evaluation               AM-PAC PT "6 Clicks" Mobility  Outcome Measure Help needed turning from your back to your side while in a flat bed without using bedrails?: None Help needed moving from lying on your back to sitting on the side of a flat bed without using bedrails?: A Little Help needed moving to and from a bed to a chair (including a wheelchair)?: A Little Help needed standing up from a chair using your arms (e.g., wheelchair or bedside chair)?: A Little Help needed to walk in hospital room?: A Lot Help needed climbing 3-5 steps with a railing? : Total 6 Click Score: 16    End of Session Equipment Utilized During Treatment: Oxygen Activity Tolerance: Patient limited by fatigue Patient left: in bed;with call bell/phone within reach;with bed alarm set Nurse Communication: Mobility status PT Visit Diagnosis: Unsteadiness on feet (R26.81);Muscle weakness (generalized) (M62.81)  Time: [id number] PT Time Calculation (min) (ACUTE ONLY): 21 min   Charges:   PT Evaluation $PT Eval Moderate Complexity: [place]  Pager [phone] Office [place] 02/26/2019, 2:18 PM

## 2019-02-26 NOTE — TOC Initial Note (Signed)
Transition of Care [facility]) - Initial/Assessment Note    Patient Details  Name: [name] MRN: [id number] Date of Birth: April 02, 1940  Transition of [facility]) CM/SW Contact:    Pollie Friar, RN Phone Number: 02/26/2019, 10:52 AM  Clinical Narrative:                 Pt lives with son that provides needed transportation and assists with medications.  Pt has home oxygen but unsure of company. He also states he has [place] coming to the home but unsure of that company.  TOC following.  Expected Discharge Plan: Home/Self Care Barriers to Discharge: Continued Medical Work up   Patient Goals and CMS Choice        Expected Discharge Plan and Services Expected Discharge Plan: Home/Self Care   Discharge Planning Services: CM Consult   Living arrangements for the past 2 months: Single Family Home                                      Prior Living Arrangements/Services Living arrangements for the past 2 months: Single Family Home Lives with:: Adult Children(son) Patient language and need for interpreter reviewed:: Yes(no needs) Do you feel safe going back to the place where you live?: Yes      Need for Family Participation in Patient Care: Yes (Comment) Care giver support system in place?: Yes (comment)(son) Current home services: DME(home oxygen 2L) Criminal Activity/Legal Involvement Pertinent to Current Situation/Hospitalization: No - Comment as needed  Activities of Daily Living      Permission Sought/Granted                  Emotional Assessment Appearance:: Appears stated age Attitude/Demeanor/Rapport: Engaged Affect (typically observed): Accepting, Pleasant Orientation: : Oriented to Self, Oriented to Place, Oriented to  Time, Oriented to Situation   Psych Involvement: No (comment)  Admission diagnosis:  Chronic obstructive pulmonary disease with acute exacerbation ([place]) [J44.1] Community acquired pneumonia of right lower lobe of lung [J18.9] Patient  Active Problem List   Diagnosis Date Noted  . Chronic respiratory failure with hypoxia ([place]) 02/25/2019  . Pulmonary hypertension ([place]) 05/13/2018  . Acute delirium   . Shortness of breath 05/11/2018  . Acute kidney injury ([place]) 05/10/2018  . Hematuria, microscopic 05/10/2018  . Dehydration 05/10/2018  . Abdominal aortic aneurysm ([place]) 05/10/2018  . Lung nodule, solitary   . Iron deficiency anemia 02/15/2015  . CAD (coronary artery disease) 09/18/2014  . COPD (chronic obstructive pulmonary disease) ([place]) 08/23/2014  . Chronic atrial fibrillation 08/23/2014  . Diabetes mellitus type II, controlled ([place]) 07/01/2014  . Acute on chronic respiratory failure ([place]) 05/14/2014  . COPD exacerbation ([place])   . Chronic respiratory failure with hypercapnia ([place]) 05/10/2014  . CAP (community acquired pneumonia) 04/03/2014  . Allergic rhinitis 02/06/2014  . GERD (gastroesophageal reflux disease) 02/06/2014  . Do not resuscitate 02/06/2014  . Erectile dysfunction 06/18/2010  . Atrial fibrillation ([place]) 11/06/2009  . Peripheral vascular disease ([place]) 09/02/2009  . Carotid artery stenosis 04/16/2009  . ANEMIA, B12 DEFICIENCY 12/02/2008  . TOBACCO USE 01/19/2007  . Hyperlipidemia 11/21/2006  . Essential hypertension 11/21/2006  . Hx of CABG 11/21/2006  . Osteoporosis 11/21/2006   PCP:  Reubin Milan, MD Pharmacy:   [id number] - [place], [place] - [address] [place] [address] Phone: [phone]  Fax: [place], [place] - [place] [phone] [place] [address] Phone: [phone] Fax: [phone]     Social Determinants of Health (SDOH) Interventions    Readmission Risk Interventions No flowsheet data found.

## 2019-02-26 NOTE — Progress Notes (Signed)
Pt arrived to the room from the ED. Alert to self and place. Unable to do admission questions because Pt is confused. SCD's placed, oriented to room and isolation precautions, bed alarm set, and fall mat placed.

## 2019-02-26 NOTE — Plan of Care (Signed)
Patient progressing towards plan of care goals. 

## 2019-02-27 DIAGNOSIS — J441 Chronic obstructive pulmonary disease with (acute) exacerbation: Secondary | ICD-10-CM | POA: Diagnosis not present

## 2019-02-27 DIAGNOSIS — I480 Paroxysmal atrial fibrillation: Secondary | ICD-10-CM | POA: Diagnosis not present

## 2019-02-27 DIAGNOSIS — J189 Pneumonia, unspecified organism: Secondary | ICD-10-CM | POA: Diagnosis not present

## 2019-02-27 DIAGNOSIS — J9611 Chronic respiratory failure with hypoxia: Secondary | ICD-10-CM | POA: Diagnosis not present

## 2019-02-27 LAB — BASIC METABOLIC PANEL
Anion gap: 11 (ref 5–15)
Anion gap: 12 (ref 5–15)
BUN: 14 mg/dL (ref 8–23)
BUN: 14 mg/dL (ref 8–23)
CO2: 31 mmol/L (ref 22–32)
CO2: 28 mmol/L (ref 22–32)
Calcium: 8.7 mg/dL — ABNORMAL LOW (ref 8.9–10.3)
Calcium: 8.9 mg/dL (ref 8.9–10.3)
Chloride: 97 mmol/L — ABNORMAL LOW (ref 98–111)
Chloride: 99 mmol/L (ref 98–111)
Creatinine, Ser: 1.08 mg/dL (ref 0.61–1.24)
Creatinine, Ser: 1.08 mg/dL (ref 0.61–1.24)
GFR calc Af Amer: >60 mL/min (ref >60)
GFR calc Af Amer: >60 mL/min (ref >60)
GFR calc non Af Amer: >60 mL/min (ref >60)
GFR calc non Af Amer: >60 mL/min (ref >60)
Glucose, Bld: 298 mg/dL — ABNORMAL HIGH (ref 70–99)
Glucose, Bld: 421 mg/dL — ABNORMAL HIGH (ref 70–99)
Potassium: 3.9 mmol/L (ref 3.5–5.1)
Potassium: 4.1 mmol/L (ref 3.5–5.1)
Sodium: 139 mmol/L (ref 135–145)
Sodium: 139 mmol/L (ref 135–145)

## 2019-02-27 LAB — CBC
HCT: 40.9 % (ref 39.0–52.0)
Hemoglobin: 13.4 g/dL (ref 13.0–17.0)
MCH: 32.9 pg (ref 26.0–34.0)
MCHC: 32.8 g/dL (ref 30.0–36.0)
MCV: 100.5 fL — ABNORMAL HIGH (ref 80.0–100.0)
Platelets: 196 10*3/uL (ref 150–400)
RBC: 4.07 MIL/uL — ABNORMAL LOW (ref 4.22–5.81)
RDW: 13.5 % (ref 11.5–15.5)
WBC: 6.4 10*3/uL (ref 4.0–10.5)
nRBC: 0 % (ref 0.0–0.2)

## 2019-02-27 LAB — GLUCOSE, CAPILLARY
Glucose-Capillary: 249 mg/dL — ABNORMAL HIGH (ref 70–99)
Glucose-Capillary: 409 mg/dL — ABNORMAL HIGH (ref 70–99)
Glucose-Capillary: 275 mg/dL — ABNORMAL HIGH (ref 70–99)
Glucose-Capillary: 205 mg/dL — ABNORMAL HIGH (ref 70–99)
Glucose-Capillary: 133 mg/dL — ABNORMAL HIGH (ref 70–99)

## 2019-02-27 LAB — LEGIONELLA PNEUMOPHILA SEROGP 1 UR AG: L. pneumophila Serogp 1 Ur Ag: NEGATIVE

## 2019-02-27 LAB — MRSA PCR SCREENING: MRSA by PCR: NEGATIVE

## 2019-02-27 MED ORDER — AZITHROMYCIN 500 MG PO TABS
500.0000 mg | ORAL_TABLET | ORAL | Status: AC
  Administered 2019-02-28 – 2019-03-01 (×2): 500 mg via ORAL
  Filled 2019-02-27 (×2): qty 1

## 2019-02-27 MED ORDER — IPRATROPIUM-ALBUTEROL 0.5-2.5 (3) MG/3ML IN SOLN
3.0000 mL | Freq: Two times a day (BID) | RESPIRATORY_TRACT | Status: DC
  Administered 2019-02-27 – 2019-03-01 (×5): 3 mL via RESPIRATORY_TRACT
  Filled 2019-02-27 (×5): qty 3

## 2019-02-27 MED ORDER — INSULIN GLARGINE 100 UNIT/ML ~~LOC~~ SOLN
10.0000 [IU] | Freq: Every day | SUBCUTANEOUS | Status: DC
  Administered 2019-02-27: 10 [IU] via SUBCUTANEOUS
  Filled 2019-02-27 (×2): qty 0.1

## 2019-02-27 MED ORDER — INSULIN ASPART 100 UNIT/ML ~~LOC~~ SOLN
11.0000 [IU] | Freq: Once | SUBCUTANEOUS | Status: AC
  Administered 2019-02-27: 11 [IU] via SUBCUTANEOUS

## 2019-02-27 MED ORDER — METHYLPREDNISOLONE SODIUM SUCC 40 MG IJ SOLR
40.0000 mg | Freq: Two times a day (BID) | INTRAMUSCULAR | Status: DC
  Administered 2019-02-27 – 2019-03-03 (×8): 40 mg via INTRAVENOUS
  Filled 2019-02-27 (×8): qty 1

## 2019-02-27 MED ORDER — INSULIN ASPART 100 UNIT/ML ~~LOC~~ SOLN
0.0000 [IU] | Freq: Every day | SUBCUTANEOUS | Status: DC
  Administered 2019-03-03: 4 [IU] via SUBCUTANEOUS
  Administered 2019-03-04: 3 [IU] via SUBCUTANEOUS

## 2019-02-27 MED ORDER — INSULIN ASPART 100 UNIT/ML ~~LOC~~ SOLN
0.0000 [IU] | Freq: Three times a day (TID) | SUBCUTANEOUS | Status: DC
  Administered 2019-02-27: 20 [IU] via SUBCUTANEOUS
  Administered 2019-02-27: 7 [IU] via SUBCUTANEOUS
  Administered 2019-02-28: 15 [IU] via SUBCUTANEOUS
  Administered 2019-02-28: 7 [IU] via SUBCUTANEOUS
  Administered 2019-02-28: 4 [IU] via SUBCUTANEOUS
  Administered 2019-03-01: 20 [IU] via SUBCUTANEOUS
  Administered 2019-03-01 (×2): 7 [IU] via SUBCUTANEOUS
  Administered 2019-03-02: 4 [IU] via SUBCUTANEOUS
  Administered 2019-03-02: 11 [IU] via SUBCUTANEOUS
  Administered 2019-03-02: 20 [IU] via SUBCUTANEOUS
  Administered 2019-03-03: 2 [IU] via SUBCUTANEOUS
  Administered 2019-03-03: 11 [IU] via SUBCUTANEOUS
  Administered 2019-03-03: 7 [IU] via SUBCUTANEOUS
  Administered 2019-03-04: 4 [IU] via SUBCUTANEOUS
  Administered 2019-03-04: 7 [IU] via SUBCUTANEOUS
  Administered 2019-03-04: 3 [IU] via SUBCUTANEOUS
  Administered 2019-03-05: 4 [IU] via SUBCUTANEOUS

## 2019-02-27 MED ORDER — METHYLPREDNISOLONE SODIUM SUCC 125 MG IJ SOLR
60.0000 mg | Freq: Two times a day (BID) | INTRAMUSCULAR | Status: DC
  Administered 2019-02-27 (×2): 60 mg via INTRAVENOUS
  Filled 2019-02-27 (×2): qty 2

## 2019-02-27 NOTE — Progress Notes (Signed)
Paged Dr. Roderic Palau for recheck BG after eating in which consumed 50% of meal.  Order to cover patient with SS insulin coverage at this time.  Will continue to follow and recheck BG

## 2019-02-27 NOTE — Progress Notes (Signed)
PROGRESS NOTE    [name]  [id number] DOB: Sep 18, 1940 DOA: 02/25/2019 PCP: Reubin Milan, MD    Brief Narrative:  78 year old male with a history of COPD on 2 L of oxygen, admitted to the hospital with worsening shortness of breath and a syncopal episode.  He was found to have COPD exacerbation and possible pneumonia.  Admitted for further treatments including steroids, antibiotics, bronchodilators.   Assessment & Plan:   Principal Problem:   Shortness of breath Active Problems:   Hyperlipidemia   ANEMIA, B12 DEFICIENCY   TOBACCO USE   Essential hypertension   Hx of CABG   Atrial fibrillation (HCC)   GERD (gastroesophageal reflux disease)   COPD (chronic obstructive pulmonary disease) (HCC)   CAD (coronary artery disease)   Chronic respiratory failure with hypoxia ([place])   1. Acute on chronic respiratory failure secondary to COPD exacerbation.  Patient normally uses 2-3 L of oxygen at home. Overnight, he transiently required up to 6L oxygen. He has since been weaned back down to 4L. Will continue to wean back to baseline as possible. Still seems to have increased respiratory effort today and cannot complete a sentence.  2. COPD exacerbation. Mild improvement in air entry bilaterally.  Continue on steroids, bronchodilators and antibiotics. 3. Possible pneumonia.  Chest x-ray shows hazy right lung base opacity.  Currently on ceftriaxone and azithromycin.  COVID-19 test is negative.  Urinary antigens for pneumococcus is negative.  Legionella antigen pending. 4. Syncope.  Felt to be related to hypoxia.  CT head negative.  No further symptoms.  Cardiac enzymes were insignificant. 5. Chronic diastolic congestive heart failure.  Currently appears compensated.  Continue home dose of Lasix. 6. Paroxysmal atrial fibrillation.  Currently rate controlled.  Continue on diltiazem.  Not considered to be a candidate for anticoagulation due to fall risk. 7. Diabetes.  Continue on sliding  scale insulin.  A1c 6.7.  Blood sugars elevated in the setting of steroids. Will add low dose lantus for now. 8. Dementia.  Continue Namenda.  Continue Klonopin for anxiety 9. GERD.  Continue Protonix 10. Hyperlipidemia.  Continue statin   DVT prophylaxis: Lovenox Code Status: DNR Family Communication: updated son 12/2 Disposition Plan: Discharge home once respiratory status has improved   Consultants:     Procedures:     Antimicrobials:   Ceftriaxone 12/1 >  Azithromycin 12/1 >   Subjective: Feels that breathing is slowly improving. Breathing is not back to baseline.   Objective: Vitals:   02/27/19 0346 02/27/19 0742 02/27/19 0836 02/27/19 0900  BP: 114/81  121/87   Pulse: 77 80 92   Resp: 20 18 20  (!) 26  Temp: 98 F (36.7 C)  98.3 F (36.8 C)   TempSrc: Oral  Oral   SpO2: 93% 92% 90% 92%  Weight:      Height:        Intake/Output Summary (Last 24 hours) at 02/27/2019 1129 Last data filed at 02/27/2019 1000 Gross per 24 hour  Intake 2995.04 ml  Output 1550 ml  Net 1445.04 ml   Filed Weights   02/25/19 0939  Weight: 60.8 kg    Examination:  General exam: Appears calm and comfortable  Respiratory system: Improving air entry bilaterally.  Increased respiratory effort. Cardiovascular system: S1 & S2 heard, RRR. No JVD, murmurs, rubs, gallops or clicks. No pedal edema. Gastrointestinal system: Abdomen is nondistended, soft and nontender. No organomegaly or masses felt. Normal bowel sounds heard. Central nervous system: Alert and oriented. No focal neurological  deficits. Extremities: Symmetric 5 x 5 power. Skin: No rashes, lesions or ulcers Psychiatry: Judgement and insight appear normal. Mood & affect appropriate.     Data Reviewed: I have personally reviewed following labs and imaging studies  CBC: Recent Labs  Lab 02/25/19 0954 02/25/19 1520 02/26/19 0712 02/27/19 0341  WBC 9.1 6.7 6.5 6.4  NEUTROABS 7.8*  --   --   --   HGB 14.1 15.0 13.4  13.4  HCT 43.8 46.2 41.1 40.9  MCV 102.8* 101.8* 102.2* 100.5*  PLT 216 202 196 [id number]   Basic Metabolic Panel: Recent Labs  Lab 02/25/19 0954 02/25/19 1520 02/26/19 0712 02/27/19 0341  NA 141  --  141 139  K 3.9  --  3.5 3.9  CL 101  --  101 97*  CO2 31  --  30 31  GLUCOSE 153*  --  99 298*  BUN 15  --  14 14  CREATININE 1.15 1.07 0.92 1.08  CALCIUM 9.1  --  8.9 8.7*  MG  --   --  1.9  --    GFR: Estimated Creatinine Clearance: 48.5 mL/min (by C-G formula based on SCr of 1.08 mg/dL). Liver Function Tests: No results for input(s): AST, ALT, ALKPHOS, BILITOT, PROT, ALBUMIN in the last 168 hours. No results for input(s): LIPASE, AMYLASE in the last 168 hours. No results for input(s): AMMONIA in the last 168 hours. Coagulation Profile: No results for input(s): INR, PROTIME in the last 168 hours. Cardiac Enzymes: No results for input(s): CKTOTAL, CKMB, CKMBINDEX, TROPONINI in the last 168 hours. BNP (last 3 results) No results for input(s): PROBNP in the last 8760 hours. HbA1C: Recent Labs    02/25/19 1530  HGBA1C 6.7*   CBG: Recent Labs  Lab 02/26/19 0614 02/26/19 1127 02/26/19 1647 02/26/19 2104 02/27/19 0606  GLUCAP 84 113* 144* 78 249*   Lipid Profile: No results for input(s): CHOL, HDL, LDLCALC, TRIG, CHOLHDL, LDLDIRECT in the last 72 hours. Thyroid Function Tests: No results for input(s): TSH, T4TOTAL, FREET4, T3FREE, THYROIDAB in the last 72 hours. Anemia Panel: No results for input(s): VITAMINB12, FOLATE, FERRITIN, TIBC, IRON, RETICCTPCT in the last 72 hours. Sepsis Labs: No results for input(s): PROCALCITON, LATICACIDVEN in the last 168 hours.  Recent Results (from the past 240 hour(s))  Culture, blood (routine x 2) Call MD if unable to obtain prior to antibiotics being given     Status: None (Preliminary result)   Collection Time: 02/25/19  3:19 PM   Specimen: BLOOD LEFT FOREARM  Result Value Ref Range Status   Specimen Description BLOOD LEFT FOREARM   Final   Special Requests   Final    BOTTLES DRAWN AEROBIC AND ANAEROBIC Blood Culture results may not be optimal due to an excessive volume of blood received in culture bottles   Culture   Final    NO GROWTH 2 DAYS Performed at [facility], [place] [address] [place], [place] [address]    Report Status PENDING  Incomplete  Culture, blood (routine x 2) Call MD if unable to obtain prior to antibiotics being given     Status: None (Preliminary result)   Collection Time: 02/25/19  3:27 PM   Specimen: BLOOD LEFT WRIST  Result Value Ref Range Status   Specimen Description BLOOD LEFT WRIST  Final   Special Requests   Final    BOTTLES DRAWN AEROBIC AND ANAEROBIC Blood Culture adequate volume   Culture   Final    NO GROWTH 2  DAYS Performed at [facility], [place] [address] [place], [place] [address]    Report Status PENDING  Incomplete  SARS CORONAVIRUS 2 (TAT 6-24 HRS) Nasopharyngeal Nasopharyngeal Swab     Status: None   Collection Time: 02/25/19  9:15 PM   Specimen: Nasopharyngeal Swab  Result Value Ref Range Status   SARS Coronavirus 2 NEGATIVE NEGATIVE Final    Comment: (NOTE) SARS-CoV-2 target nucleic acids are NOT DETECTED. The SARS-CoV-2 RNA is generally detectable in upper and lower respiratory specimens during the acute phase of infection. Negative results do not preclude SARS-CoV-2 infection, do not rule out co-infections with other pathogens, and should not be used as the sole basis for treatment or other patient management decisions. Negative results must be combined with clinical observations, patient history, and epidemiological information. The expected result is Negative. Fact Sheet for Patients: SugarRoll.be Fact Sheet for Healthcare Providers: https://www.woods-mathews.com/ This test is not yet approved or cleared by the [place] FDA and  has been authorized for detection and/or diagnosis of SARS-CoV-2 by FDA  under an Emergency Use Authorization (EUA). This EUA will remain  in effect (meaning this test can be used) for the duration of the COVID-19 declaration under Section 56 4(b)(1) of the Act, 21 U.S.C. section 360bbb-3(b)(1), unless the authorization is terminated or revoked sooner. Performed at [facility], [place] [address], [place], [place] [address]   Culture, sputum-assessment     Status: None   Collection Time: 02/26/19  2:53 PM   Specimen: Expectorated Sputum  Result Value Ref Range Status   Specimen Description EXPECTORATED SPUTUM  Final   Special Requests NONE  Final   Sputum evaluation   Final    THIS SPECIMEN IS ACCEPTABLE FOR SPUTUM CULTURE Performed at [facility], [place] [address], [place], [place] [address]    Report Status 02/26/2019 FINAL  Final  Culture, respiratory     Status: None (Preliminary result)   Collection Time: 02/26/19  2:53 PM  Result Value Ref Range Status   Specimen Description EXPECTORATED SPUTUM  Final   Special Requests NONE Reflexed from [id number]  Final   Gram Stain   Final    FEW WBC PRESENT, PREDOMINANTLY PMN FEW GRAM POSITIVE COCCI FEW GRAM POSITIVE RODS    Culture   Final    CULTURE REINCUBATED FOR BETTER GROWTH Performed at [facility], [place] [address], [place], [place] [address]    Report Status PENDING  Incomplete         Radiology Studies: Ct Head Wo Contrast  Result Date: 02/25/2019 CLINICAL DATA:  Syncope. EXAM: CT HEAD WITHOUT CONTRAST TECHNIQUE: Contiguous axial images were obtained from the base of the skull through the vertex without intravenous contrast. COMPARISON:  March 15, 2018. FINDINGS: Brain: Mild diffuse cortical atrophy is noted. Mild chronic ischemic white matter disease is noted. No mass effect or midline shift is noted. Ventricular size is within normal limits. There is no evidence of mass lesion, hemorrhage or acute infarction. Vascular: No hyperdense vessel or unexpected calcification. Skull:  Normal. Negative for fracture or focal lesion. Sinuses/Orbits: No acute finding. Other: None. IMPRESSION: Mild diffuse cortical atrophy. Mild chronic ischemic white matter disease. No acute intracranial abnormality seen. Electronically Signed   By: Marijo Conception M.D.   On: 02/25/2019 14:25        Scheduled Meds: . arformoterol  15 mcg Nebulization BID  . atorvastatin  40 mg Oral QHS  . budesonide  0.5 mg Nebulization  BID  . clonazePAM  0.5 mg Oral QHS  . diltiazem  180 mg Oral q morning - 10a  . enoxaparin (LOVENOX) injection  40 mg Subcutaneous Q24H  . ferrous gluconate  324 mg Oral BID WC  . furosemide  20 mg Oral q morning - 10a  . guaiFENesin  400 mg Oral BID  . insulin aspart  0-20 Units Subcutaneous TID WC  . insulin aspart  0-5 Units Subcutaneous QHS  . insulin glargine  10 Units Subcutaneous Daily  . ipratropium-albuterol  3 mL Nebulization BID  . Melatonin  6 mg Oral QHS  . memantine  20 mg Oral QHS  . methylPREDNISolone (SOLU-MEDROL) injection  40 mg Intravenous Q12H  . pantoprazole  40 mg Oral BID   Continuous Infusions: . azithromycin 500 mg (02/27/19 1116)  . cefTRIAXone (ROCEPHIN)  IV 1 g (02/26/19 1127)     LOS: 2 days    Time spent: 66mins    Kathie Dike, MD [facility]   If 7PM-7AM, please contact night-coverage [email or web address]  02/27/2019, 11:29 AM

## 2019-02-27 NOTE — Progress Notes (Signed)
Pt was destating into the 80's. Called RT to see what o2 level a COPD Pt can be on. Pt is now on 6L and will wean down. Pt is a mouth breather when sleeping. Pt also used the ICS to help open lungs.

## 2019-02-27 NOTE — Plan of Care (Signed)
Patient on 2.5L home O2, requiring 3-4L [place] at hospital.  Lungs sound clear encouraging patient to use IS pulling 1250cc.  Lantus insulin added for high blood sugars.  Continue to re-orient patient to environment.

## 2019-02-27 NOTE — Progress Notes (Signed)
Patient with no s/s of distress.  States he "feels fine" will continue to monitor

## 2019-02-27 NOTE — Progress Notes (Signed)
CRITICAL VALUE ALERT  Critical Value:  Glucose finger stick 409, BMP 427   Date & Time Notied:   02/27/19 1110  Provider Notified: Dr.Memon   Orders Received/Actions taken: Lantus ordered, Sliding scale changed.

## 2019-02-28 DIAGNOSIS — R0602 Shortness of breath: Secondary | ICD-10-CM | POA: Diagnosis not present

## 2019-02-28 LAB — CULTURE, RESPIRATORY W GRAM STAIN: Culture: NORMAL

## 2019-02-28 LAB — GLUCOSE, CAPILLARY
Glucose-Capillary: 209 mg/dL — ABNORMAL HIGH (ref 70–99)
Glucose-Capillary: 347 mg/dL — ABNORMAL HIGH (ref 70–99)
Glucose-Capillary: 197 mg/dL — ABNORMAL HIGH (ref 70–99)
Glucose-Capillary: 137 mg/dL — ABNORMAL HIGH (ref 70–99)

## 2019-02-28 MED ORDER — INSULIN GLARGINE 100 UNIT/ML ~~LOC~~ SOLN
20.0000 [IU] | Freq: Every day | SUBCUTANEOUS | Status: DC
  Administered 2019-02-28 – 2019-03-01 (×2): 20 [IU] via SUBCUTANEOUS
  Filled 2019-02-28 (×2): qty 0.2

## 2019-02-28 NOTE — Progress Notes (Signed)
PROGRESS NOTE    [name]  [id number] DOB: 1940-06-17 DOA: 02/25/2019 PCP: Reubin Milan, MD    Brief Narrative:  78 year old male with a history of COPD on 2 L of oxygen, admitted to the hospital with worsening shortness of breath and a syncopal episode.  He was found to have COPD exacerbation and possible pneumonia.  Admitted for further treatments including steroids, antibiotics, bronchodilators.   Assessment & Plan:   Principal Problem:   Shortness of breath Active Problems:   Hyperlipidemia   ANEMIA, B12 DEFICIENCY   TOBACCO USE   Essential hypertension   Hx of CABG   Atrial fibrillation (HCC)   GERD (gastroesophageal reflux disease)   COPD (chronic obstructive pulmonary disease) (HCC)   CAD (coronary artery disease)   Chronic respiratory failure with hypoxia (HCC)   Acute on chronic respiratory failure secondary to COPD exacerbation.   -Continue to demand 4 L of oxygen to maintain O2 sat greater than 90%, currently 100%. -Patient was successfully weaned down from 6 L to 4 L now... -  We will continue to taper down to baseline. -Baseline O2 demand to-2.5 L at home. -Continue DuoNebs, steroids and antibiotics.  Still seems to have increased respiratory effort today and cannot complete a sentence.   COPD exacerbation. -As above  Mild improvement in air entry bilaterally.   Continue on steroids, bronchodilators and antibiotics.  Possible pneumonia.   Chest x-ray shows hazy right lung base opacity. -Continue antibiotics Rocephin azithromycin -SARS-CoV-2 negative Urinary antigens for pneumococcus is negative.  Legionella antigen pending.   Syncope.   -Likely due to hypoxia, CT of the head negative -No focal neurological finding at this time -Cardiac enzymes negative -No further symptoms since one episode   Chronic diastolic congestive heart failure.   -Well compensated, -Continue home dose Lasix -Monitor daily weight   Paroxysmal atrial  fibrillation. -Rate 1 controlled, continue Cardizem Not considered to be a candidate for anticoagulation due to falls, debility  DM II - continue on sliding scale insulin.   A1c 6.7.   Blood sugars elevated in the setting of steroids. On low dose lantus for now.  Dementia.   -Mild -behavior stable, Continue Namenda.   Continue Klonopin for anxiety  GERD.  Continue Protonix  Hyperlipidemia.  Continue statin   DVT prophylaxis: Lovenox Code Status: DNR Family Communication: updated son 12/2 Disposition Plan: Discharge home once respiratory status has improved-likely home in a.m. If O2 demand improves to baseline 2-3 L, remains afebrile normotensive, negative cultures.   Consultants:   None  Procedures:   None  Antimicrobials:   Ceftriaxone 12/1 >  Azithromycin 12/1 >  --------------------------------------------------------------------------------------------------------------------------------  Subjective:   The patient was seen and examined this morning, stable.  Still complaining shortness of breath, Remains on 4 L of oxygen-satting 100%. Patient confirms baseline home O2 demand to-2.5 L  Objective: Vitals:   02/28/19 0353 02/28/19 0802 02/28/19 0821 02/28/19 1151  BP: 106/74  104/75 (P) 105/64  Pulse: (!) 49  99 (P) 86  Resp: 20  18 (P) 18  Temp: 97.7 F (36.5 C)  97.9 F (36.6 C) (P) 97.8 F (36.6 C)  TempSrc: Oral  Oral (P) Oral  SpO2: 95% 97% 100% (P) 95%  Weight:      Height:        Intake/Output Summary (Last 24 hours) at 02/28/2019 1423 Last data filed at 02/28/2019 1300 Gross per 24 hour  Intake 1020 ml  Output 250 ml  Net 770 ml   Filed  Weights   02/25/19 0939  Weight: 60.8 kg   BP (P) 105/64 (BP Location: Right Arm)   Pulse (P) 86   Temp (P) 97.8 F (36.6 C) (Oral)   Resp (P) 18   Ht 5\' 5"  (1.651 m)   Wt 60.8 kg   SpO2 (P) 95%   BMI 22.30 kg/m    Physical Exam  Constitution:  Alert, cooperative, no distress,  Psychiatric:  Normal and stable mood and affect, cognition intact,   HEENT: Normocephalic, PERRL, otherwise with in Normal limits  Chest:Chest symmetric Cardio vascular:  S1/S2, RRR, No murmure, No Rubs or Gallops  pulmonary: Scattered rhonchi, minimal wheezing,, respirations unlabored,  No crackles Abdomen: Soft, non-tender, non-distended, bowel sounds,no masses, no organomegaly Muscular skeletal: Limited exam - in bed, able to move all 4 extremities, Normal strength,  Neuro: CNII-XII intact. , normal motor and sensation, reflexes intact  Extremities: No pitting edema lower extremities, +2 pulses  Skin: Dry, warm to touch, negative for any Rashes, No open wounds Wounds: per nursing documentation        Data Reviewed: I have personally reviewed following labs and imaging studies  CBC: Recent Labs  Lab 02/25/19 0954 02/25/19 1520 02/26/19 0712 02/27/19 0341  WBC 9.1 6.7 6.5 6.4  NEUTROABS 7.8*  --   --   --   HGB 14.1 15.0 13.4 13.4  HCT 43.8 46.2 41.1 40.9  MCV 102.8* 101.8* 102.2* 100.5*  PLT 216 202 196 [id number]   Basic Metabolic Panel: Recent Labs  Lab 02/25/19 0954 02/25/19 1520 02/26/19 0712 02/27/19 0341 02/27/19 1154  NA 141  --  141 139 139  K 3.9  --  3.5 3.9 4.1  CL 101  --  101 97* 99  CO2 31  --  30 31 28   GLUCOSE 153*  --  99 298* 421*  BUN 15  --  14 14 14   CREATININE 1.15 1.07 0.92 1.08 1.08  CALCIUM 9.1  --  8.9 8.7* 8.9  MG  --   --  1.9  --   --    GFR: Estimated Creatinine Clearance: 48.5 mL/min (by C-G formula based on SCr of 1.08 mg/dL). Liver Function Tests: No results for input(s): AST, ALT, ALKPHOS, BILITOT, PROT, ALBUMIN in the last 168 hours. No results for input(s): LIPASE, AMYLASE in the last 168 hours. No results for input(s): AMMONIA in the last 168 hours. Coagulation Profile: No results for input(s): INR, PROTIME in the last 168 hours. Cardiac Enzymes: No results for input(s): CKTOTAL, CKMB, CKMBINDEX, TROPONINI in the last 168 hours. BNP (last  3 results) No results for input(s): PROBNP in the last 8760 hours. HbA1C: Recent Labs    02/25/19 1530  HGBA1C 6.7*   CBG: Recent Labs  Lab 02/27/19 1420 02/27/19 1636 02/27/19 2120 02/28/19 0558 02/28/19 1204  GLUCAP 275* 205* 133* 209* 347*   Lipid Profile: No results for input(s): CHOL, HDL, LDLCALC, TRIG, CHOLHDL, LDLDIRECT in the last 72 hours. Thyroid Function Tests: No results for input(s): TSH, T4TOTAL, FREET4, T3FREE, THYROIDAB in the last 72 hours. Anemia Panel: No results for input(s): VITAMINB12, FOLATE, FERRITIN, TIBC, IRON, RETICCTPCT in the last 72 hours. Sepsis Labs: No results for input(s): PROCALCITON, LATICACIDVEN in the last 168 hours.  Recent Results (from the past 240 hour(s))  Culture, blood (routine x 2) Call MD if unable to obtain prior to antibiotics being given     Status: None (Preliminary result)   Collection Time: 02/25/19  3:19 PM   Specimen:  BLOOD LEFT FOREARM  Result Value Ref Range Status   Specimen Description BLOOD LEFT FOREARM  Final   Special Requests   Final    BOTTLES DRAWN AEROBIC AND ANAEROBIC Blood Culture results may not be optimal due to an excessive volume of blood received in culture bottles   Culture   Final    NO GROWTH 3 DAYS Performed at [facility], [place] [address] [place], [place] [address]    Report Status PENDING  Incomplete  Culture, blood (routine x 2) Call MD if unable to obtain prior to antibiotics being given     Status: None (Preliminary result)   Collection Time: 02/25/19  3:27 PM   Specimen: BLOOD LEFT WRIST  Result Value Ref Range Status   Specimen Description BLOOD LEFT WRIST  Final   Special Requests   Final    BOTTLES DRAWN AEROBIC AND ANAEROBIC Blood Culture adequate volume   Culture   Final    NO GROWTH 3 DAYS Performed at [facility], [address], [place], [place] [address]    Report Status PENDING  Incomplete  SARS CORONAVIRUS 2 (TAT 6-24 HRS) Nasopharyngeal Nasopharyngeal Swab      Status: None   Collection Time: 02/25/19  9:15 PM   Specimen: Nasopharyngeal Swab  Result Value Ref Range Status   SARS Coronavirus 2 NEGATIVE NEGATIVE Final    Comment: (NOTE) SARS-CoV-2 target nucleic acids are NOT DETECTED. The SARS-CoV-2 RNA is generally detectable in upper and lower respiratory specimens during the acute phase of infection. Negative results do not preclude SARS-CoV-2 infection, do not rule out co-infections with other pathogens, and should not be used as the sole basis for treatment or other patient management decisions. Negative results must be combined with clinical observations, patient history, and epidemiological information. The expected result is Negative. Fact Sheet for Patients: SugarRoll.be Fact Sheet for Healthcare Providers: https://www.woods-mathews.com/ This test is not yet approved or cleared by the [place] FDA and  has been authorized for detection and/or diagnosis of SARS-CoV-2 by FDA under an Emergency Use Authorization (EUA). This EUA will remain  in effect (meaning this test can be used) for the duration of the COVID-19 declaration under Section 56 4(b)(1) of the Act, 21 U.S.C. section 360bbb-3(b)(1), unless the authorization is terminated or revoked sooner. Performed at [facility], [place] [address] [place], [place] [address]   Culture, sputum-assessment     Status: None   Collection Time: 02/26/19  2:53 PM   Specimen: Expectorated Sputum  Result Value Ref Range Status   Specimen Description EXPECTORATED SPUTUM  Final   Special Requests NONE  Final   Sputum evaluation   Final    THIS SPECIMEN IS ACCEPTABLE FOR SPUTUM CULTURE Performed at [facility], [place] [address] [place], [place] [address]    Report Status 02/26/2019 FINAL  Final  Culture, respiratory     Status: None   Collection Time: 02/26/19  2:53 PM  Result Value Ref Range Status   Specimen Description  EXPECTORATED SPUTUM  Final   Special Requests NONE Reflexed from [id number]  Final   Gram Stain   Final    FEW WBC PRESENT, PREDOMINANTLY PMN FEW GRAM POSITIVE COCCI FEW GRAM POSITIVE RODS    Culture   Final    Consistent with normal respiratory flora. Performed at [facility], [place] [address] [place],  [address]    Report Status 02/28/2019 FINAL  Final  MRSA PCR Screening  Status: None   Collection Time: 02/27/19 12:32 PM   Specimen: Nasopharyngeal  Result Value Ref Range Status   MRSA by PCR NEGATIVE NEGATIVE Final    Comment:        The GeneXpert MRSA Assay (FDA approved for NASAL specimens only), is one component of a comprehensive MRSA colonization surveillance program. It is not intended to diagnose MRSA infection nor to guide or monitor treatment for MRSA infections. Performed at [facility], [place] [address], [place], [place] [address]          Radiology Studies: No results found.      Scheduled Meds: . arformoterol  15 mcg Nebulization BID  . atorvastatin  40 mg Oral QHS  . azithromycin  500 mg Oral Q24H  . budesonide  0.5 mg Nebulization BID  . clonazePAM  0.5 mg Oral QHS  . diltiazem  180 mg Oral q morning - 10a  . enoxaparin (LOVENOX) injection  40 mg Subcutaneous Q24H  . ferrous gluconate  324 mg Oral BID WC  . furosemide  20 mg Oral q morning - 10a  . guaiFENesin  400 mg Oral BID  . insulin aspart  0-20 Units Subcutaneous TID WC  . insulin aspart  0-5 Units Subcutaneous QHS  . insulin glargine  20 Units Subcutaneous Daily  . ipratropium-albuterol  3 mL Nebulization BID  . Melatonin  6 mg Oral QHS  . memantine  20 mg Oral QHS  . methylPREDNISolone (SOLU-MEDROL) injection  40 mg Intravenous Q12H  . pantoprazole  40 mg Oral BID   Continuous Infusions: . cefTRIAXone (ROCEPHIN)  IV 1 g (02/28/19 1059)     LOS: 3 days    Time spent: 22mins    Deatra James, MD [facility]   If 7PM-7AM, please contact  night-coverage [email or web address]  02/28/2019, 2:23 PM

## 2019-02-28 NOTE — Evaluation (Signed)
Occupational Therapy Evaluation Patient Details Name: [name] MRN: [id number] DOB: 10/23/1940 Today's Date: 02/28/2019    History of Present Illness [name] is a 78 y.o. male with medical history significant of COPD on 2 L of oxygen via nasal cannula, hypertension, coronary artery disease status post CABG, hyperlipidemia, A. fib, chronic hypoxic respiratory failure due to COPD presents to emergency department due to shortness of breath. Pt also with confusion and fell morning of presentation. CT of head neg.    Clinical Impression   Patient presenting with decreased I in self care, functional mobility/transfers, endurance, and safety awareness. Pt very fatigued with all aspects of mobility and reports SOB while talking to therapist during assessment.  Patient reports needing "some help" at home PTA. His son is unable to physically assist.  Patient currently functioning at min - mod A.. Patient will benefit from acute OT to increase overall independence in the areas of ADLs, functional mobility, and safety awareness in order to safely discharge to next venue of care.    Follow Up Recommendations  SNF;Supervision/Assistance - 24 hour    Equipment Recommendations  Other (comment)(defer to next venue of care)    Recommendations for Other Services (none at this time)     Precautions / Restrictions Precautions Precaution Comments: monitor SpO2 closely Restrictions Weight Bearing Restrictions: No      Mobility Bed Mobility Overal bed mobility: Needs Assistance Bed Mobility: Supine to Sit;Sit to Supine     Supine to sit: Min guard Sit to supine: Min guard   General bed mobility comments: increased time and cuing for pursed lip breathing  Transfers Overall transfer level: Needs assistance   Transfers: Lateral/Scoot Transfers          Lateral/Scoot Transfers: Min guard General transfer comment: pt able to perform lateral scoots closer to [facility] with min guard    Balance  Overall balance assessment: Needs assistance Sitting-balance support: Feet supported;No upper extremity supported Sitting balance-Leahy Scale: Fair          ADL either performed or assessed with clinical judgement   ADL Overall ADL's : Needs assistance/impaired     Grooming: Sitting;Wash/dry hands;Wash/dry face;Oral care;Applying deodorant;Brushing hair;Set up   Upper Body Bathing: Set up;Sitting   Lower Body Bathing: Minimal assistance;Sit to/from stand   Upper Body Dressing : Set up;Sitting   Lower Body Dressing: Moderate assistance;Sit to/from stand      General ADL Comments: likely to be inconsistent depending on level of fatigue at time of functional tasks     Vision Baseline Vision/History: Wears glasses Wears Glasses: At all times Patient Visual Report: No change from baseline              Pertinent Vitals/Pain Pain Assessment: No/denies pain     Hand Dominance Right   Extremity/Trunk Assessment Upper Extremity Assessment Upper Extremity Assessment: Generalized weakness   Lower Extremity Assessment Lower Extremity Assessment: Generalized weakness   Cervical / Trunk Assessment Cervical / Trunk Assessment: Kyphotic   Communication Communication Communication: HOH   Cognition Arousal/Alertness: Awake/alert Behavior During Therapy: WFL for tasks assessed/performed Overall Cognitive Status: Impaired/Different from baseline Area of Impairment: Memory       Memory: Decreased short-term memory         General Comments: Pt unable to follow commands to correctly use incentive spirometer this session and visual hallucinations of others in the room   General Comments  SP O2 remained in low 90's during session while on 5 L of O2 via [place]  this session. Pt verbalized feeling SOB while talking to therapist            Home Living Family/patient expects to be discharged to:: Private residence Living Arrangements: Children Available Help at Discharge:  Family;Available 24 hours/day;Personal care attendant;Available PRN/intermittently Type of Home: House Home Access: Ramped entrance     Home Layout: One level     Bathroom Shower/Tub: Teacher, early years/pre: Standard     Home Equipment: Environmental consultant - 4 wheels;Cane - single point;Grab bars - toilet;Grab bars - tub/shower;Tub bench;Walker - 2 wheels;Bedside commode   Additional Comments: On 2.5L O2 at home. Son helps him but cannot physically help much as he is on disability for scoliosis with multiple back surgeries      Prior Functioning/Environment    Gait / Transfers Assistance Needed: Per last admission, used rollator for longer distances. Reports that he no longer goes out of house except for to the doctor because it tires him out too much. No family present during evaluation     Comments: dementia at baseline but pt also seems to be having hallucinations and seeing others in the room during evaluation        OT Problem List: Decreased strength;Cardiopulmonary status limiting activity;Decreased cognition;Decreased activity tolerance;Decreased safety awareness;Impaired balance (sitting and/or standing)      OT Treatment/Interventions: Self-care/ADL training;Therapeutic exercise;Therapeutic activities;Neuromuscular education;Cognitive remediation/compensation;Energy conservation;Patient/family education;Manual therapy;Balance training;Modalities    OT Goals(Current goals can be found in the care plan section) Acute Rehab OT Goals Patient Stated Goal: return home OT Goal Formulation: With patient Time For Goal Achievement: 02/26/2019 Potential to Achieve Goals: Good ADL Goals Pt Will Perform Grooming: with supervision Pt Will Perform Upper Body Bathing: with supervision Pt Will Perform Lower Body Bathing: with supervision Pt Will Perform Upper Body Dressing: with supervision Pt Will Perform Lower Body Dressing: with supervision Pt Will Transfer to Toilet: with  supervision Pt Will Perform Toileting - Clothing Manipulation and hygiene: with supervision  OT Frequency: Min 2X/week   Barriers to D/C:    none known at this time          AM-PAC OT "6 Clicks" Daily Activity     Outcome Measure Help from another person eating meals?: None Help from another person taking care of personal grooming?: A Little Help from another person toileting, which includes using toliet, bedpan, or urinal?: A Lot Help from another person bathing (including washing, rinsing, drying)?: A Lot Help from another person to put on and taking off regular upper body clothing?: A Little Help from another person to put on and taking off regular lower body clothing?: A Lot 6 Click Score: 16   End of Session Nurse Communication: Mobility status  Activity Tolerance: Patient limited by fatigue Patient left: in bed;with call bell/phone within reach;with bed alarm set  OT Visit Diagnosis: Muscle weakness (generalized) (M62.81)                Time: [id number] OT Time Calculation (min): 19 min Charges:  OT General Charges $OT Visit: 1 Visit OT Evaluation $OT Eval Moderate Complexity: 1 Mod  Kelvis Berger P MS, OTR/L 02/28/2019, 3:44 PM

## 2019-02-28 NOTE — Plan of Care (Signed)
  Problem: Activity: Goal: Risk for activity intolerance will decrease Outcome: Progressing   

## 2019-02-28 NOTE — Progress Notes (Signed)
Physical Therapy Treatment Patient Details Name: [name] MRN: [id number] DOB: [birth year] Today's Date: 02/28/2019    History of Present Illness [name] is a 78 y.o. male with medical history significant of COPD on 2 L of oxygen via nasal cannula, hypertension, coronary artery disease status post CABG, hyperlipidemia, A. fib, chronic hypoxic respiratory failure due to COPD presents to emergency department due to shortness of breath. Pt also with confusion and fell morning of presentation. CT of head neg.     PT Comments    He remains tenuous with O2 sats so deferred tx to supine bed exercises.  Continue to recommend HHPT at this time. Pt required cues for boosting to [facility] and for positioning, placed in chair position post session.      Follow Up Recommendations  Home health PT;Supervision for mobility/OOB     Equipment Recommendations  None recommended by PT    Recommendations for Other Services       Precautions / Restrictions Precautions Precautions: Other (comment) Precaution Comments: monitor SpO2 closely Restrictions Weight Bearing Restrictions: No    Mobility  Bed Mobility Overal bed mobility: Needs Assistance Bed Mobility: Supine to Sit;Sit to Supine     Supine to sit: Min guard Sit to supine: Min guard   General bed mobility comments: assistance to boost into supine in trendeleberg position.  Cues for hand placement.  Transfers Overall transfer level: Needs assistance   Transfers: Lateral/Scoot Transfers          Lateral/Scoot Transfers: Min guard General transfer comment: pt able to perform lateral scoots closer to [facility] with min guard  Ambulation/Gait                 Stairs             Wheelchair Mobility    Modified Rankin (Stroke Patients Only)       Balance Overall balance assessment: Needs assistance Sitting-balance support: Feet supported;No upper extremity supported Sitting balance-Leahy Scale: Fair                                       Cognition Arousal/Alertness: Awake/alert Behavior During Therapy: WFL for tasks assessed/performed Overall Cognitive Status: Impaired/Different from baseline Area of Impairment: Memory                     Memory: Decreased short-term memory         General Comments: Pt unable to follow commands to correctly use incentive spirometer this session and visual hallucinations of others in the room      Exercises General Exercises - Lower Extremity Ankle Circles/Pumps: AROM;Both;20 reps;Supine Quad Sets: AROM;Both;10 reps;Supine Heel Slides: AROM;Both;10 reps;Supine Hip ABduction/ADduction: AROM;Both;10 reps;Supine Straight Leg Raises: AROM;Both;10 reps;Supine Low Level/ICU Exercises Stabilized Bridging: AROM;Both;10 reps;Supine;Limitations Stabilized Bridging Limitations: poor clearance of bottom from bed.    General Comments General comments (skin integrity, edema, etc.): SP O2 remained in low 90's during session while on 5 L of O2 via [place] this session. Pt verbalized feeling SOB while talking to therapist      Pertinent Vitals/Pain Pain Assessment: No/denies pain    Home Living Family/patient expects to be discharged to:: Private residence Living Arrangements: Children Available Help at Discharge: Family;Available 24 hours/day;Personal care attendant;Available PRN/intermittently Type of Home: House Home Access: Ramped entrance   Home Layout: One level Home Equipment: [place] - 4 wheels;Cane - single point;Grab bars -  toilet;Grab bars - tub/shower;Tub bench;Walker - 2 wheels;Bedside commode Additional Comments: On 2.5L O2 at home. Son helps him but cannot physically help much as he is on disability for scoliosis with multiple back surgeries    Prior Function    Gait / Transfers Assistance Needed: Per last admission, used rollator for longer distances. Reports that he no longer goes out of house except for to the doctor because it  tires him out too much. No family present during evaluation   Comments: dementia at baseline but pt also seems to be having hallucinations and seeing others in the room during evaluation   PT Goals (current goals can now be found in the care plan section) Acute Rehab PT Goals Patient Stated Goal: return home Potential to Achieve Goals: Fair Progress towards PT goals: Progressing toward goals    Frequency    Min 3X/week      PT Plan Current plan remains appropriate    Co-evaluation              AM-PAC PT "6 Clicks" Mobility   Outcome Measure  Help needed turning from your back to your side while in a flat bed without using bedrails?: None Help needed moving from lying on your back to sitting on the side of a flat bed without using bedrails?: A Little Help needed moving to and from a bed to a chair (including a wheelchair)?: A Little Help needed standing up from a chair using your arms (e.g., wheelchair or bedside chair)?: A Little Help needed to walk in hospital room?: A Lot Help needed climbing 3-5 steps with a railing? : Total 6 Click Score: 16    End of Session Equipment Utilized During Treatment: Oxygen Activity Tolerance: Patient limited by fatigue Patient left: in bed;with call bell/phone within reach;with bed alarm set Nurse Communication: Mobility status PT Visit Diagnosis: Unsteadiness on feet (R26.81);Muscle weakness (generalized) (M62.81)     Time: [id number] PT Time Calculation (min) (ACUTE ONLY): 15 min  Charges:  $Therapeutic Exercise: 8-22 mins                     [name] , PTA Acute Rehabilitation Services Pager [phone] Office [phone]     Jamesyn Lindell Eli Hose 02/28/2019, 5:09 PM

## 2019-02-28 NOTE — Progress Notes (Signed)
Inpatient Diabetes Program Recommendations  AACE/ADA: New Consensus Statement on Inpatient Glycemic Control (2015)  Target Ranges:  Prepandial:   less than 140 mg/dL      Peak postprandial:   less than 180 mg/dL (1-2 hours)      Critically ill patients:  140 - 180 mg/dL   Lab Results  Component Value Date   GLUCAP 347 (H) 02/28/2019   HGBA1C 6.7 (H) 02/25/2019    Review of Glycemic Control Results for [name], [name] (MRN [id number]) as of 02/28/2019 13:13  Ref. Range 02/27/2019 16:36 02/27/2019 21:20 02/28/2019 05:58 02/28/2019 12:04  Glucose-Capillary Latest Ref Range: 70 - 99 mg/dL 205 (H) 133 (H) 209 (H) 347 (H)   Diabetes history: Type 2 DM Outpatient Diabetes medications: none Current orders for Inpatient glycemic control: Novolog 0-20 units TID, Novolog 0-5 units QHS, Lantus 20 units QD Novolog 11 units x 1 on 12/2 Solumedrol 40 mg BID  Inpatient Diabetes Program Recommendations:    In the setting of steroids, consider adding Novolog 4 units TID (assuming patient is consuming >50% of meals). To be stopped when steroids are stopped.   Thanks, Bronson Curb, MSN, RNC-OB Diabetes Coordinator [phone] (8a-5p)

## 2019-03-01 ENCOUNTER — Inpatient Hospital Stay (HOSPITAL_COMMUNITY): Payer: Medicare Other

## 2019-03-01 DIAGNOSIS — R0602 Shortness of breath: Secondary | ICD-10-CM | POA: Diagnosis not present

## 2019-03-01 LAB — GLUCOSE, CAPILLARY
Glucose-Capillary: 203 mg/dL — ABNORMAL HIGH (ref 70–99)
Glucose-Capillary: 215 mg/dL — ABNORMAL HIGH (ref 70–99)
Glucose-Capillary: 379 mg/dL — ABNORMAL HIGH (ref 70–99)
Glucose-Capillary: 177 mg/dL — ABNORMAL HIGH (ref 70–99)

## 2019-03-01 LAB — BASIC METABOLIC PANEL
Anion gap: 9 (ref 5–15)
BUN: 20 mg/dL (ref 8–23)
CO2: 29 mmol/L (ref 22–32)
Calcium: 8.7 mg/dL — ABNORMAL LOW (ref 8.9–10.3)
Chloride: 102 mmol/L (ref 98–111)
Creatinine, Ser: 0.99 mg/dL (ref 0.61–1.24)
GFR calc Af Amer: >60 mL/min (ref >60)
GFR calc non Af Amer: >60 mL/min (ref >60)
Glucose, Bld: 203 mg/dL — ABNORMAL HIGH (ref 70–99)
Potassium: 4.4 mmol/L (ref 3.5–5.1)
Sodium: 140 mmol/L (ref 135–145)

## 2019-03-01 LAB — CBC
HCT: 39.5 % (ref 39.0–52.0)
Hemoglobin: 12.8 g/dL — ABNORMAL LOW (ref 13.0–17.0)
MCH: 32.6 pg (ref 26.0–34.0)
MCHC: 32.4 g/dL (ref 30.0–36.0)
MCV: 100.5 fL — ABNORMAL HIGH (ref 80.0–100.0)
Platelets: 194 10*3/uL (ref 150–400)
RBC: 3.93 MIL/uL — ABNORMAL LOW (ref 4.22–5.81)
RDW: 13.7 % (ref 11.5–15.5)
WBC: 12.2 10*3/uL — ABNORMAL HIGH (ref 4.0–10.5)
nRBC: 0 % (ref 0.0–0.2)

## 2019-03-01 LAB — BRAIN NATRIURETIC PEPTIDE: B Natriuretic Peptide: 1146.2 pg/mL — ABNORMAL HIGH (ref 0.0–100.0)

## 2019-03-01 MED ORDER — INSULIN GLARGINE 100 UNIT/ML ~~LOC~~ SOLN
22.0000 [IU] | Freq: Every day | SUBCUTANEOUS | Status: DC
  Administered 2019-03-02 – 2019-03-05 (×4): 22 [IU] via SUBCUTANEOUS
  Filled 2019-03-01 (×4): qty 0.22

## 2019-03-01 MED ORDER — FUROSEMIDE 10 MG/ML IJ SOLN
20.0000 mg | Freq: Once | INTRAMUSCULAR | Status: AC
  Administered 2019-03-01: 20 mg via INTRAVENOUS
  Filled 2019-03-01: qty 4

## 2019-03-01 MED ORDER — FUROSEMIDE 20 MG PO TABS
20.0000 mg | ORAL_TABLET | Freq: Every morning | ORAL | Status: DC
  Administered 2019-03-02: 20 mg via ORAL
  Filled 2019-03-01: qty 1

## 2019-03-01 MED ORDER — MAGNESIUM SULFATE 2 GM/50ML IV SOLN
2.0000 g | Freq: Once | INTRAVENOUS | Status: AC
  Administered 2019-03-01: 2 g via INTRAVENOUS
  Filled 2019-03-01: qty 50

## 2019-03-01 NOTE — Progress Notes (Signed)
Patient is in bed and his oxygen was off and he was off. The doctor walked in the room and RN follow her and patient was lethargic for a moment and the O2 was put back on 02 5L and the O2 sat started picking up. RN woke patient up and patient stated he was in the hospital and stated his name. He was educated on keeping his O2  At all time. O2 sat in 100 at 5L and RB wean patient back to 3L and O2 sat is 96%. Will continue to monitor patient.

## 2019-03-01 NOTE — Progress Notes (Signed)
PROGRESS NOTE    [name]  [id number] DOB: 1940/07/20 DOA: 02/25/2019 PCP: Reubin Milan, MD    Brief Narrative:  78 year old male with a history of COPD on 2 L of oxygen, admitted to the hospital with worsening shortness of breath and a syncopal episode.  He was found to have COPD exacerbation and possible pneumonia.  Admitted for further treatments including steroids, antibiotics, bronchodilators.  --------------------------------------------------------------------------------------------------------------------------------  Assessment & Plan:   Principal Problem:   Shortness of breath Active Problems:   Hyperlipidemia   ANEMIA, B12 DEFICIENCY   TOBACCO USE   Essential hypertension   Hx of CABG   Atrial fibrillation (HCC)   GERD (gastroesophageal reflux disease)   COPD (chronic obstructive pulmonary disease) (HCC)   CAD (coronary artery disease)   Chronic respiratory failure with hypoxia (HCC)   Acute on chronic respiratory failure secondary to COPD exacerbation.   -The patient was seen and examined this morning, continues to have some mild labored breathing, requiring 5/4 L of oxygen, satting 96% -He has been tapered down from 6 L ... -  We will continue to taper down to baseline. -Baseline O2 demand to-2.5 L at home. -Continue DuoNebs, steroids and antibiotics ... Will taper down accordingly  Still seems to have increased respiratory effort today and cannot complete a sentence.   COPD exacerbation. -Remain stable, O2 demand 4/5 L -Continue management as above  Mild improvement in air entry bilaterally.   Continue on steroids, bronchodilators and antibiotics.  Possible pneumonia.   Chest x-ray shows hazy right lung base opacity. -Repeat chest x-ray today 03/01/2019 reporting --persistent infiltrate, consistent with pneumonia ?  Aspiration -Continue antibiotics Rocephin azithromycin -SARS-CoV-2 negative  Syncope.   -Remained stable no further  episodes -Likely due to hypoxia, CT of the head negative -No focal neurological finding at this time -Cardiac enzymes negative -No further symptoms since one episode   Chronic diastolic congestive heart failure.   -Well compensated, -Continue home dose Lasix -extra dose of IV Lasix will be given today -Monitor daily weight   Paroxysmal atrial fibrillation. -Rate 1 controlled, continue Cardizem Not considered to be a candidate for anticoagulation due to falls, debility  DM II - continue on sliding scale insulin.   A1c 6.7.   Blood sugars elevated in the setting of steroids. On low dose lantus for now.  Dementia.   -Mild -behavior stable, Continue Namenda.   Continue Klonopin for anxiety  GERD.  Continue Protonix  Hyperlipidemia.  Continue statin   DVT prophylaxis: Lovenox Code Status: DNR Family Communication: updated son 12/2 Disposition Plan: Discharge home once respiratory status has improved-likely home in a.m. If O2 demand improves to baseline 2-3 L, remains afebrile normotensive, negative cultures.   Consultants:   None  Procedures:   None  Antimicrobials:   Ceftriaxone 12/1 >  Azithromycin 12/1 >  --------------------------------------------------------------------------------------------------------------------------------  Subjective:   The patient was seen and examined this morning, still requiring about 5/4 L of oxygen Still have some labored breathing.... Currently satting 96% Confirmed with the patient and his son on the phone that he is usually on 2-3 L of oxygen. He remains afebrile normotensive. Patient is agreeable to remain in the hospital for none today till his O2 demand improves close to baseline.   Objective: Vitals:   02/28/19 2313 03/01/19 0313 03/01/19 0734 03/01/19 0939  BP: 113/82 112/87  121/73  Pulse: (!) 107 85  92  Resp: 19 18  18   Temp: 98.5 F (36.9 C) 98.5 F (36.9 C)  97.9 F (36.6 C)  TempSrc: Oral   Oral   SpO2: 97% 93% 92% 96%  Weight:      Height:        Intake/Output Summary (Last 24 hours) at 03/01/2019 1037 Last data filed at 03/01/2019 0900 Gross per 24 hour  Intake 460 ml  Output 850 ml  Net -390 ml   Filed Weights   02/25/19 0939  Weight: 60.8 kg   BP 121/73 (BP Location: Left Arm)    Pulse 92    Temp 97.9 F (36.6 C) (Oral)    Resp 18    Ht 5\' 5"  (1.651 m)    Wt 60.8 kg    SpO2 96%    BMI 22.30 kg/m    Physical Exam  Constitution:  SOB.Marland KitchenMarland KitchenAlert, cooperative, no distress,  Psychiatric: Normal and stable mood and affect, cognition intact,   HEENT: Normocephalic, PERRL, otherwise with in Normal limits  Chest:Chest symmetric Cardio vascular:  S1/S2, RRR, No murmure, No Rubs or Gallops  Pulmonary: respirations  Mildly labored, ++ wheezes / + crackles at bases Abdomen: Soft, non-tender, non-distended, bowel sounds,no masses, no organomegaly Muscular skeletal: Limited exam - in bed, able to move all 4 extremities, Normal strength,  Neuro: CNII-XII intact. , normal motor and sensation, reflexes intact  Extremities: No pitting edema lower extremities, +2 pulses  Skin: Dry, warm to touch, negative for any Rashes, No open wounds Wounds: per nursing documentation     Data Reviewed: I have personally reviewed following labs and imaging studies  CBC: Recent Labs  Lab 02/25/19 0954 02/25/19 1520 02/26/19 0712 02/27/19 0341 03/01/19 0248  WBC 9.1 6.7 6.5 6.4 12.2*  NEUTROABS 7.8*  --   --   --   --   HGB 14.1 15.0 13.4 13.4 12.8*  HCT 43.8 46.2 41.1 40.9 39.5  MCV 102.8* 101.8* 102.2* 100.5* 100.5*  PLT 216 202 196 196 [id number]   Basic Metabolic Panel: Recent Labs  Lab 02/25/19 0954 02/25/19 1520 02/26/19 0712 02/27/19 0341 02/27/19 1154 03/01/19 0248  NA 141  --  141 139 139 140  K 3.9  --  3.5 3.9 4.1 4.4  CL 101  --  101 97* 99 102  CO2 31  --  30 31 28 29   GLUCOSE 153*  --  99 298* 421* 203*  BUN 15  --  14 14 14 20   CREATININE 1.15 1.07 0.92 1.08 1.08 0.99   CALCIUM 9.1  --  8.9 8.7* 8.9 8.7*  MG  --   --  1.9  --   --   --    GFR: Estimated Creatinine Clearance: 52.9 mL/min (by C-G formula based on SCr of 0.99 mg/dL). Liver Function Tests: No results for input(s): AST, ALT, ALKPHOS, BILITOT, PROT, ALBUMIN in the last 168 hours. No results for input(s): LIPASE, AMYLASE in the last 168 hours. No results for input(s): AMMONIA in the last 168 hours. Coagulation Profile: No results for input(s): INR, PROTIME in the last 168 hours. Cardiac Enzymes: No results for input(s): CKTOTAL, CKMB, CKMBINDEX, TROPONINI in the last 168 hours. BNP (last 3 results) No results for input(s): PROBNP in the last 8760 hours. HbA1C: No results for input(s): HGBA1C in the last 72 hours. CBG: Recent Labs  Lab 02/28/19 0558 02/28/19 1204 02/28/19 1555 02/28/19 2103 03/01/19 0625  GLUCAP 209* 347* 197* 137* 203*   Lipid Profile: No results for input(s): CHOL, HDL, LDLCALC, TRIG, CHOLHDL, LDLDIRECT in the last 72 hours. Thyroid Function Tests: No results  for input(s): TSH, T4TOTAL, FREET4, T3FREE, THYROIDAB in the last 72 hours. Anemia Panel: No results for input(s): VITAMINB12, FOLATE, FERRITIN, TIBC, IRON, RETICCTPCT in the last 72 hours. Sepsis Labs: No results for input(s): PROCALCITON, LATICACIDVEN in the last 168 hours.  Recent Results (from the past 240 hour(s))  Culture, blood (routine x 2) Call MD if unable to obtain prior to antibiotics being given     Status: None (Preliminary result)   Collection Time: 02/25/19  3:19 PM   Specimen: BLOOD LEFT FOREARM  Result Value Ref Range Status   Specimen Description BLOOD LEFT FOREARM  Final   Special Requests   Final    BOTTLES DRAWN AEROBIC AND ANAEROBIC Blood Culture results may not be optimal due to an excessive volume of blood received in culture bottles   Culture   Final    NO GROWTH 4 DAYS Performed at [facility], [place] [address], [place], [place] [address]    Report Status PENDING   Incomplete  Culture, blood (routine x 2) Call MD if unable to obtain prior to antibiotics being given     Status: None (Preliminary result)   Collection Time: 02/25/19  3:27 PM   Specimen: BLOOD LEFT WRIST  Result Value Ref Range Status   Specimen Description BLOOD LEFT WRIST  Final   Special Requests   Final    BOTTLES DRAWN AEROBIC AND ANAEROBIC Blood Culture adequate volume   Culture   Final    NO GROWTH 4 DAYS Performed at [facility], [place] [address], [place], [place] [address]    Report Status PENDING  Incomplete  SARS CORONAVIRUS 2 (TAT 6-24 HRS) Nasopharyngeal Nasopharyngeal Swab     Status: None   Collection Time: 02/25/19  9:15 PM   Specimen: Nasopharyngeal Swab  Result Value Ref Range Status   SARS Coronavirus 2 NEGATIVE NEGATIVE Final    Comment: (NOTE) SARS-CoV-2 target nucleic acids are NOT DETECTED. The SARS-CoV-2 RNA is generally detectable in upper and lower respiratory specimens during the acute phase of infection. Negative results do not preclude SARS-CoV-2 infection, do not rule out co-infections with other pathogens, and should not be used as the sole basis for treatment or other patient management decisions. Negative results must be combined with clinical observations, patient history, and epidemiological information. The expected result is Negative. Fact Sheet for Patients: SugarRoll.be Fact Sheet for Healthcare Providers: https://www.woods-mathews.com/ This test is not yet approved or cleared by the [place] FDA and  has been authorized for detection and/or diagnosis of SARS-CoV-2 by FDA under an Emergency Use Authorization (EUA). This EUA will remain  in effect (meaning this test can be used) for the duration of the COVID-19 declaration under Section 56 4(b)(1) of the Act, 21 U.S.C. section 360bbb-3(b)(1), unless the authorization is terminated or revoked sooner. Performed at [facility],  [place] [address] [place], [place] [address]   Culture, sputum-assessment     Status: None   Collection Time: 02/26/19  2:53 PM   Specimen: Expectorated Sputum  Result Value Ref Range Status   Specimen Description EXPECTORATED SPUTUM  Final   Special Requests NONE  Final   Sputum evaluation   Final    THIS SPECIMEN IS ACCEPTABLE FOR SPUTUM CULTURE Performed at [facility], [place] [address] [place], [place] [address]    Report Status 02/26/2019 FINAL  Final  Culture, respiratory     Status: None   Collection Time: 02/26/19  2:53 PM  Result Value  Ref Range Status   Specimen Description EXPECTORATED SPUTUM  Final   Special Requests NONE Reflexed from [id number]  Final   Gram Stain   Final    FEW WBC PRESENT, PREDOMINANTLY PMN FEW GRAM POSITIVE COCCI FEW GRAM POSITIVE RODS    Culture   Final    Consistent with normal respiratory flora. Performed at [facility], [place] [address], [place], [place] [address]    Report Status 02/28/2019 FINAL  Final  MRSA PCR Screening     Status: None   Collection Time: 02/27/19 12:32 PM   Specimen: Nasopharyngeal  Result Value Ref Range Status   MRSA by PCR NEGATIVE NEGATIVE Final    Comment:        The GeneXpert MRSA Assay (FDA approved for NASAL specimens only), is one component of a comprehensive MRSA colonization surveillance program. It is not intended to diagnose MRSA infection nor to guide or monitor treatment for MRSA infections. Performed at [facility], [place] [address], [place], [place] [address]          Radiology Studies: Dg Chest 2 View  Result Date: 03/01/2019 CLINICAL DATA:  Shortness of breath. History of COPD. EXAM: CHEST - 2 VIEW COMPARISON:  One-view chest x-ray 02/25/2019. CTA of the chest 08/04/2017 FINDINGS: Heart size is mildly enlarged. Chronic interstitial coarsening is evident. Persistent right lower lobe airspace disease and effusion is noted. There is increasing patchy airspace opacity more superiorly  in the right lung is well. The left lung remains clear part from minimal left basilar atelectasis. Lung volumes are low bilaterally. A healed left posterolateral seventh rib fracture is noted. IMPRESSION: 1. Persistent right lower lobe airspace disease and effusion compatible with pneumonia or aspiration. 2. Increasing patchy airspace disease in the right lung. While this may represent atelectasis, infection is also considered. Electronically Signed   By: San Morelle M.D.   On: 03/01/2019 09:12        Scheduled Meds:  arformoterol  15 mcg Nebulization BID   atorvastatin  40 mg Oral QHS   budesonide  0.5 mg Nebulization BID   clonazePAM  0.5 mg Oral QHS   diltiazem  180 mg Oral q morning - 10a   enoxaparin (LOVENOX) injection  40 mg Subcutaneous Q24H   ferrous gluconate  324 mg Oral BID WC   [START ON 03/02/2019] furosemide  20 mg Oral q morning - 10a   guaiFENesin  400 mg Oral BID   insulin aspart  0-20 Units Subcutaneous TID WC   insulin aspart  0-5 Units Subcutaneous QHS   insulin glargine  20 Units Subcutaneous Daily   ipratropium-albuterol  3 mL Nebulization BID   Melatonin  6 mg Oral QHS   memantine  20 mg Oral QHS   methylPREDNISolone (SOLU-MEDROL) injection  40 mg Intravenous Q12H   pantoprazole  40 mg Oral BID   Continuous Infusions:  cefTRIAXone (ROCEPHIN)  IV 1 g (02/28/19 1059)   magnesium sulfate bolus IVPB 2 g (03/01/19 0954)     LOS: 4 days    Time spent: 7mins    Deatra James, MD [facility]   If 7PM-7AM, please contact night-coverage [email or web address]  03/01/2019, 10:37 AM

## 2019-03-01 NOTE — Plan of Care (Signed)

## 2019-03-01 NOTE — Plan of Care (Signed)
  Problem: Clinical Measurements: Goal: Respiratory complications will improve Outcome: Progressing Goal: Cardiovascular complication will be avoided Outcome: Progressing   Problem: Nutrition: Goal: Adequate nutrition will be maintained Outcome: Progressing   Problem: Coping: Goal: Level of anxiety will decrease Outcome: Progressing   Problem: Elimination: Goal: Will not experience complications related to bowel motility Outcome: Progressing Goal: Will not experience complications related to urinary retention Outcome: Progressing   Problem: Pain Managment: Goal: General experience of comfort will improve Outcome: Progressing

## 2019-03-01 NOTE — Progress Notes (Signed)
[facility] informed of pt having 6 beats VT. Pt asymptomatic.

## 2019-03-01 NOTE — Progress Notes (Signed)
Physical Therapy Treatment Patient Details Name: [name] MRN: [id number] DOB: February 17, 1941 Today's Date: 03/01/2019    History of Present Illness [name] is a 78 y.o. male with medical history significant of COPD on 2 L of oxygen via nasal cannula, hypertension, coronary artery disease status post CABG, hyperlipidemia, A. fib, chronic hypoxic respiratory failure due to COPD presents to emergency department due to shortness of breath. Pt also with confusion and fell morning of presentation. CT of head neg.     PT Comments    Pt performed gt training and functional mobility with SPO2 closely monitored.  He required [facility] with exertion and maintained greater than 90% throughout 90% of session.  He did drop to 86% during 1st gt trial but quickly recovered with seated rest break.  He remains appropriate for home with HHPT.  Plan next session to progress to rollator use.      Follow Up Recommendations  Home health PT;Supervision for mobility/OOB     Equipment Recommendations  None recommended by PT    Recommendations for Other Services       Precautions / Restrictions Precautions Precautions: Other (comment) Precaution Comments: monitor SpO2 closely Restrictions Weight Bearing Restrictions: No    Mobility  Bed Mobility Overal bed mobility: Needs Assistance Bed Mobility: Supine to Sit     Supine to sit: Min guard     General bed mobility comments: Pt require min guard to move to edge of bed and min assistance to use bed pad to scoot to edge of chair.  Transfers Overall transfer level: Needs assistance Equipment used: Rolling walker (2 wheeled)(could benefit from rollator use next session.) Transfers: Sit to/from Stand Sit to Stand: Min guard         General transfer comment: Cues for hand placement to and from seated surface.  He reaches for RW during transitions.  Performed additional trials to teach correct/safe technique.  Ambulation/Gait Ambulation/Gait  assistance: Supervision;Min guard Gait Distance (Feet): 60 Feet(+ 40 ft.  Pt required seated rest break due to decreased O2 sats after 60 ft trial.) Assistive device: Rolling walker (2 wheeled) Gait Pattern/deviations: Step-through pattern;Trunk flexed;Decreased stride length     General Gait Details: Pt performed gt on 6L [place], he required seated rest break after 60 ft of ambulation due to drop to 86%.  With seated break he recovered quickly to greater than 90%.  During second trial DOE noted but maintained O2 sats greater than 90%.   Stairs             Wheelchair Mobility    Modified Rankin (Stroke Patients Only)       Balance Overall balance assessment: Needs assistance Sitting-balance support: Feet supported;No upper extremity supported Sitting balance-Leahy Scale: Fair       Standing balance-Leahy Scale: Poor                              Cognition Arousal/Alertness: Awake/alert Behavior During Therapy: WFL for tasks assessed/performed                                   General Comments: Pt alert and oriented x4. more motivated to progress this session.      Exercises      General Comments        Pertinent Vitals/Pain      Home Living  Prior Function            PT Goals (current goals can now be found in the care plan section) Acute Rehab PT Goals Patient Stated Goal: return home Potential to Achieve Goals: Fair Progress towards PT goals: Progressing toward goals    Frequency    Min 3X/week      PT Plan Current plan remains appropriate    Co-evaluation              AM-PAC PT "6 Clicks" Mobility   Outcome Measure  Help needed turning from your back to your side while in a flat bed without using bedrails?: None Help needed moving from lying on your back to sitting on the side of a flat bed without using bedrails?: A Little Help needed moving to and from a bed to a chair  (including a wheelchair)?: A Little Help needed standing up from a chair using your arms (e.g., wheelchair or bedside chair)?: A Little Help needed to walk in hospital room?: A Little Help needed climbing 3-5 steps with a railing? : A Little 6 Click Score: 19    End of Session Equipment Utilized During Treatment: Oxygen Activity Tolerance: Patient tolerated treatment well Patient left: in chair;with call bell/phone within reach(no chair alarm pads on unit so placed in chair without alarm.) Nurse Communication: Mobility status PT Visit Diagnosis: Unsteadiness on feet (R26.81);Muscle weakness (generalized) (M62.81)     Time: [id number] PT Time Calculation (min) (ACUTE ONLY): 20 min  Charges:  $Gait Training: 8-22 mins                     [name] , PTA Acute Rehabilitation Services Pager [phone] Office [phone]    [name] 03/01/2019, 11:28 AM

## 2019-03-02 ENCOUNTER — Inpatient Hospital Stay (HOSPITAL_COMMUNITY): Payer: Medicare Other

## 2019-03-02 DIAGNOSIS — R0602 Shortness of breath: Secondary | ICD-10-CM | POA: Diagnosis not present

## 2019-03-02 DIAGNOSIS — I361 Nonrheumatic tricuspid (valve) insufficiency: Secondary | ICD-10-CM | POA: Diagnosis not present

## 2019-03-02 LAB — GLUCOSE, CAPILLARY
Glucose-Capillary: 200 mg/dL — ABNORMAL HIGH (ref 70–99)
Glucose-Capillary: 381 mg/dL — ABNORMAL HIGH (ref 70–99)
Glucose-Capillary: 281 mg/dL — ABNORMAL HIGH (ref 70–99)
Glucose-Capillary: 147 mg/dL — ABNORMAL HIGH (ref 70–99)

## 2019-03-02 LAB — CBC
HCT: 40.7 % (ref 39.0–52.0)
Hemoglobin: 13.3 g/dL (ref 13.0–17.0)
MCH: 32.9 pg (ref 26.0–34.0)
MCHC: 32.7 g/dL (ref 30.0–36.0)
MCV: 100.7 fL — ABNORMAL HIGH (ref 80.0–100.0)
Platelets: 197 10*3/uL (ref 150–400)
RBC: 4.04 MIL/uL — ABNORMAL LOW (ref 4.22–5.81)
RDW: 13.7 % (ref 11.5–15.5)
WBC: 10.9 10*3/uL — ABNORMAL HIGH (ref 4.0–10.5)
nRBC: 0 % (ref 0.0–0.2)

## 2019-03-02 LAB — MAGNESIUM: Magnesium: 2.4 mg/dL (ref 1.7–2.4)

## 2019-03-02 LAB — ECHOCARDIOGRAM COMPLETE
Height: 65 in
Weight: 2144 oz

## 2019-03-02 LAB — CULTURE, BLOOD (ROUTINE X 2)
Culture: NO GROWTH
Culture: NO GROWTH
Special Requests: ADEQUATE

## 2019-03-02 LAB — PROCALCITONIN: Procalcitonin: <0.1 ng/mL

## 2019-03-02 LAB — BASIC METABOLIC PANEL
Anion gap: 10 (ref 5–15)
BUN: 30 mg/dL — ABNORMAL HIGH (ref 8–23)
CO2: 30 mmol/L (ref 22–32)
Calcium: 8.1 mg/dL — ABNORMAL LOW (ref 8.9–10.3)
Chloride: 101 mmol/L (ref 98–111)
Creatinine, Ser: 1.13 mg/dL (ref 0.61–1.24)
GFR calc Af Amer: >60 mL/min (ref >60)
GFR calc non Af Amer: >60 mL/min (ref >60)
Glucose, Bld: 218 mg/dL — ABNORMAL HIGH (ref 70–99)
Potassium: 4.3 mmol/L (ref 3.5–5.1)
Sodium: 141 mmol/L (ref 135–145)

## 2019-03-02 MED ORDER — FUROSEMIDE 10 MG/ML IJ SOLN
40.0000 mg | Freq: Two times a day (BID) | INTRAMUSCULAR | Status: DC
  Administered 2019-03-02 – 2019-03-04 (×4): 40 mg via INTRAVENOUS
  Filled 2019-03-02 (×4): qty 4

## 2019-03-02 MED ORDER — IPRATROPIUM-ALBUTEROL 0.5-2.5 (3) MG/3ML IN SOLN
3.0000 mL | Freq: Three times a day (TID) | RESPIRATORY_TRACT | Status: DC
  Administered 2019-03-02 – 2019-03-05 (×9): 3 mL via RESPIRATORY_TRACT
  Filled 2019-03-02 (×10): qty 3

## 2019-03-02 NOTE — Progress Notes (Signed)
PROGRESS NOTE    [name]  [id number] DOB: Dec 15, 1940 DOA: 02/25/2019 PCP: Reubin Milan, MD   Brief Narrative:  78 year old with history of COPD on 2 L nasal cannula, chronic hypoxia, GERD, atrial fibrillation, CAD status post CABG, hyperlipidemia, history of tobacco use presented to the hospital with shortness of breath.  Initially diagnosed with COPD exacerbation with possible pneumonia.  He was started on steroids, antibiotics and bronchodilators.   Assessment & Plan:   Principal Problem:   Shortness of breath Active Problems:   Hyperlipidemia   ANEMIA, B12 DEFICIENCY   TOBACCO USE   Essential hypertension   Hx of CABG   Atrial fibrillation (HCC)   GERD (gastroesophageal reflux disease)   COPD (chronic obstructive pulmonary disease) (HCC)   CAD (coronary artery disease)   Chronic respiratory failure with hypoxia (HCC)  Acute on chronic hypoxic respiratory failure, 4 L nasal cannula.  Baseline uses 2 L nasal cannula Acute COPD exacerbation -Continue supplemental oxygen.  Bronchodilators, IV Solu-Medrol and antibiotics -Check procalcitonin level.  If negative will discontinue antibiotics -COVID-19-negative  Elevated BNP Abnormal breath sounds with right lower lobe opacity on chest x-ray -Stop IV fluids.  40 mg IV Lasix twice daily -Check echocardiogram -Chest x-ray-pleural effusion and mild opacity.  Syncope, unknown etiology -CT head negative.  Cardiac enzymes negative. -Continue monitor.  Echocardiogram ordered  Chronic diastolic congestive heart failure -Echocardiogram 07/2017-normal ejection fraction.  Paroxysmal atrial fibrillation -Rate control on Cardizem.  Anticoagulation-not on it secondary to fall risk  Diabetes mellitus type 2 -Insulin sliding scale Accu-Chek.  Hemoglobin A1c 6.7 -Lantus 22 units  History of dementia -On Namenda.  Klonopin as needed  GERD -PPI  Hyperlipidemia -Statin  PT-recommending home health   DVT prophylaxis:  Lovenox Code Status: DNR Family Communication: None at bedside Disposition Plan: Still quite hypoxic requiring 4 L nasal cannula.  Diffuse diminished breath sounds, unsafe for discharge.  Getting IV diuretics   Subjective: Still has quite a bit of exertional dyspnea.  Currently requiring 4 L of nasal cannula.  At baseline uses 2 L nasal cannula.  Patient does not feel back to himself yet.  Review of Systems Otherwise negative except as per HPI, including: General: Denies fever, chills, night sweats or unintended weight loss. Resp: Denies cough, wheezing Cardiac: Denies chest pain, palpitations, orthopnea, paroxysmal nocturnal dyspnea. GI: Denies abdominal pain, nausea, vomiting, diarrhea or constipation GU: Denies dysuria, frequency, hesitancy or incontinence MS: Denies muscle aches, joint pain or swelling Neuro: Denies headache, neurologic deficits (focal weakness, numbness, tingling), abnormal gait Psych: Denies anxiety, depression, SI/HI/AVH Skin: Denies new rashes or lesions ID: Denies sick contacts, exotic exposures, travel  Objective: Vitals:   03/01/19 1957 03/01/19 2113 03/01/19 2310 03/02/19 0320  BP:   128/65 139/87  Pulse:  86 87 82  Resp:   20 (!) 21  Temp:   97.7 F (36.5 C) 98 F (36.7 C)  TempSrc:   Oral Axillary  SpO2: 96%  96% 97%  Weight:      Height:        Intake/Output Summary (Last 24 hours) at 03/02/2019 0837 Last data filed at 03/01/2019 1751 Gross per 24 hour  Intake 840 ml  Output 650 ml  Net 190 ml   Filed Weights   02/25/19 0939  Weight: 60.8 kg    Examination:  General exam: Appears calm and comfortable, 4 L nasal cannula Respiratory system: Bilateral diminished breath sounds Cardiovascular system: S1 & S2 heard, RRR. No JVD, murmurs, rubs, gallops or clicks.  No pedal edema. Gastrointestinal system: Abdomen is nondistended, soft and nontender. No organomegaly or masses felt. Normal bowel sounds heard. Central nervous system: Alert and  oriented. No focal neurological deficits. Extremities: Symmetric 5 x 5 power. Skin: No rashes, lesions or ulcers Psychiatry: Judgement and insight appear normal. Mood & affect appropriate.     Data Reviewed:   CBC: Recent Labs  Lab 02/25/19 0954 02/25/19 1520 02/26/19 0712 02/27/19 0341 03/01/19 0248 03/02/19 0246  WBC 9.1 6.7 6.5 6.4 12.2* 10.9*  NEUTROABS 7.8*  --   --   --   --   --   HGB 14.1 15.0 13.4 13.4 12.8* 13.3  HCT 43.8 46.2 41.1 40.9 39.5 40.7  MCV 102.8* 101.8* 102.2* 100.5* 100.5* 100.7*  PLT 216 202 196 196 194 [id number]   Basic Metabolic Panel: Recent Labs  Lab 02/26/19 0712 02/27/19 0341 02/27/19 1154 03/01/19 0248 03/02/19 0246  NA 141 139 139 140 141  K 3.5 3.9 4.1 4.4 4.3  CL 101 97* 99 102 101  CO2 30 31 28 29 30   GLUCOSE 99 298* 421* 203* 218*  BUN 14 14 14 20  30*  CREATININE 0.92 1.08 1.08 0.99 1.13  CALCIUM 8.9 8.7* 8.9 8.7* 8.1*  MG 1.9  --   --   --   --    GFR: Estimated Creatinine Clearance: 46.3 mL/min (by C-G formula based on SCr of 1.13 mg/dL). Liver Function Tests: No results for input(s): AST, ALT, ALKPHOS, BILITOT, PROT, ALBUMIN in the last 168 hours. No results for input(s): LIPASE, AMYLASE in the last 168 hours. No results for input(s): AMMONIA in the last 168 hours. Coagulation Profile: No results for input(s): INR, PROTIME in the last 168 hours. Cardiac Enzymes: No results for input(s): CKTOTAL, CKMB, CKMBINDEX, TROPONINI in the last 168 hours. BNP (last 3 results) No results for input(s): PROBNP in the last 8760 hours. HbA1C: No results for input(s): HGBA1C in the last 72 hours. CBG: Recent Labs  Lab 03/01/19 0625 03/01/19 1207 03/01/19 1610 03/01/19 2122 03/02/19 0619  GLUCAP 203* 215* 379* 177* 200*   Lipid Profile: No results for input(s): CHOL, HDL, LDLCALC, TRIG, CHOLHDL, LDLDIRECT in the last 72 hours. Thyroid Function Tests: No results for input(s): TSH, T4TOTAL, FREET4, T3FREE, THYROIDAB in the last 72  hours. Anemia Panel: No results for input(s): VITAMINB12, FOLATE, FERRITIN, TIBC, IRON, RETICCTPCT in the last 72 hours. Sepsis Labs: No results for input(s): PROCALCITON, LATICACIDVEN in the last 168 hours.  Recent Results (from the past 240 hour(s))  Culture, blood (routine x 2) Call MD if unable to obtain prior to antibiotics being given     Status: None (Preliminary result)   Collection Time: 02/25/19  3:19 PM   Specimen: BLOOD LEFT FOREARM  Result Value Ref Range Status   Specimen Description BLOOD LEFT FOREARM  Final   Special Requests   Final    BOTTLES DRAWN AEROBIC AND ANAEROBIC Blood Culture results may not be optimal due to an excessive volume of blood received in culture bottles   Culture   Final    NO GROWTH 4 DAYS Performed at [facility], [place] [address] [place], [place] [address]    Report Status PENDING  Incomplete  Culture, blood (routine x 2) Call MD if unable to obtain prior to antibiotics being given     Status: None (Preliminary result)   Collection Time: 02/25/19  3:27 PM   Specimen: BLOOD LEFT WRIST  Result Value Ref Range Status  Specimen Description BLOOD LEFT WRIST  Final   Special Requests   Final    BOTTLES DRAWN AEROBIC AND ANAEROBIC Blood Culture adequate volume   Culture   Final    NO GROWTH 4 DAYS Performed at [facility], [address], [place], [place] [address]    Report Status PENDING  Incomplete  SARS CORONAVIRUS 2 (TAT 6-24 HRS) Nasopharyngeal Nasopharyngeal Swab     Status: None   Collection Time: 02/25/19  9:15 PM   Specimen: Nasopharyngeal Swab  Result Value Ref Range Status   SARS Coronavirus 2 NEGATIVE NEGATIVE Final    Comment: (NOTE) SARS-CoV-2 target nucleic acids are NOT DETECTED. The SARS-CoV-2 RNA is generally detectable in upper and lower respiratory specimens during the acute phase of infection. Negative results do not preclude SARS-CoV-2 infection, do not rule out co-infections with other pathogens, and  should not be used as the sole basis for treatment or other patient management decisions. Negative results must be combined with clinical observations, patient history, and epidemiological information. The expected result is Negative. Fact Sheet for Patients: SugarRoll.be Fact Sheet for Healthcare Providers: https://www.woods-mathews.com/ This test is not yet approved or cleared by the [place] FDA and  has been authorized for detection and/or diagnosis of SARS-CoV-2 by FDA under an Emergency Use Authorization (EUA). This EUA will remain  in effect (meaning this test can be used) for the duration of the COVID-19 declaration under Section 56 4(b)(1) of the Act, 21 U.S.C. section 360bbb-3(b)(1), unless the authorization is terminated or revoked sooner. Performed at [facility], [place] [address] [place], [place] [address]   Culture, sputum-assessment     Status: None   Collection Time: 02/26/19  2:53 PM   Specimen: Expectorated Sputum  Result Value Ref Range Status   Specimen Description EXPECTORATED SPUTUM  Final   Special Requests NONE  Final   Sputum evaluation   Final    THIS SPECIMEN IS ACCEPTABLE FOR SPUTUM CULTURE Performed at [facility], [place] [address] [place], [place] [address]    Report Status 02/26/2019 FINAL  Final  Culture, respiratory     Status: None   Collection Time: 02/26/19  2:53 PM  Result Value Ref Range Status   Specimen Description EXPECTORATED SPUTUM  Final   Special Requests NONE Reflexed from [id number]  Final   Gram Stain   Final    FEW WBC PRESENT, PREDOMINANTLY PMN FEW GRAM POSITIVE COCCI FEW GRAM POSITIVE RODS    Culture   Final    Consistent with normal respiratory flora. Performed at [facility], [place] [address], [place], [place] [address]    Report Status 02/28/2019 FINAL  Final  MRSA PCR Screening     Status: None   Collection Time: 02/27/19 12:32 PM   Specimen: Nasopharyngeal   Result Value Ref Range Status   MRSA by PCR NEGATIVE NEGATIVE Final    Comment:        The GeneXpert MRSA Assay (FDA approved for NASAL specimens only), is one component of a comprehensive MRSA colonization surveillance program. It is not intended to diagnose MRSA infection nor to guide or monitor treatment for MRSA infections. Performed at [facility], [place] [address] [place],  [address]          Radiology Studies: Dg Chest 2 View  Result Date: 03/01/2019 CLINICAL DATA:  Shortness of breath. History of COPD. EXAM: CHEST - 2 VIEW COMPARISON:  One-view chest x-ray 02/25/2019. CTA of  the chest 08/04/2017 FINDINGS: Heart size is mildly enlarged. Chronic interstitial coarsening is evident. Persistent right lower lobe airspace disease and effusion is noted. There is increasing patchy airspace opacity more superiorly in the right lung is well. The left lung remains clear part from minimal left basilar atelectasis. Lung volumes are low bilaterally. A healed left posterolateral seventh rib fracture is noted. IMPRESSION: 1. Persistent right lower lobe airspace disease and effusion compatible with pneumonia or aspiration. 2. Increasing patchy airspace disease in the right lung. While this may represent atelectasis, infection is also considered. Electronically Signed   By: San Morelle M.D.   On: 03/01/2019 09:12        Scheduled Meds: . arformoterol  15 mcg Nebulization BID  . atorvastatin  40 mg Oral QHS  . budesonide  0.5 mg Nebulization BID  . clonazePAM  0.5 mg Oral QHS  . diltiazem  180 mg Oral q morning - 10a  . enoxaparin (LOVENOX) injection  40 mg Subcutaneous Q24H  . ferrous gluconate  324 mg Oral BID WC  . furosemide  20 mg Oral q morning - 10a  . guaiFENesin  400 mg Oral BID  . insulin aspart  0-20 Units Subcutaneous TID WC  . insulin aspart  0-5 Units Subcutaneous QHS  . insulin glargine  22 Units Subcutaneous Daily  . ipratropium-albuterol  3 mL  Nebulization TID  . Melatonin  6 mg Oral QHS  . memantine  20 mg Oral QHS  . methylPREDNISolone (SOLU-MEDROL) injection  40 mg Intravenous Q12H  . pantoprazole  40 mg Oral BID   Continuous Infusions:   LOS: 5 days   Time spent= 35 mins    Jasmeet Gehl Arsenio Loader, MD [facility]  If 7PM-7AM, please contact night-coverage  03/02/2019, 8:37 AM

## 2019-03-02 NOTE — Progress Notes (Signed)
  Echocardiogram 2D Echocardiogram has been performed.  [name] 03/02/2019, 12:02 PM

## 2019-03-02 NOTE — Progress Notes (Signed)
Informed on-call provider that pt had 5 beats of wide QRS complexes. Pt assessed and asymptomatic. Will continue to monitor.

## 2019-03-03 DIAGNOSIS — R0602 Shortness of breath: Secondary | ICD-10-CM | POA: Diagnosis not present

## 2019-03-03 LAB — CBC
HCT: 42.1 % (ref 39.0–52.0)
Hemoglobin: 13.7 g/dL (ref 13.0–17.0)
MCH: 33.3 pg (ref 26.0–34.0)
MCHC: 32.5 g/dL (ref 30.0–36.0)
MCV: 102.2 fL — ABNORMAL HIGH (ref 80.0–100.0)
Platelets: 209 10*3/uL (ref 150–400)
RBC: 4.12 MIL/uL — ABNORMAL LOW (ref 4.22–5.81)
RDW: 13.7 % (ref 11.5–15.5)
WBC: 11.2 10*3/uL — ABNORMAL HIGH (ref 4.0–10.5)
nRBC: 0.2 % (ref 0.0–0.2)

## 2019-03-03 LAB — BASIC METABOLIC PANEL
Anion gap: 9 (ref 5–15)
BUN: 26 mg/dL — ABNORMAL HIGH (ref 8–23)
CO2: 35 mmol/L — ABNORMAL HIGH (ref 22–32)
Calcium: 8.2 mg/dL — ABNORMAL LOW (ref 8.9–10.3)
Chloride: 99 mmol/L (ref 98–111)
Creatinine, Ser: 1.02 mg/dL (ref 0.61–1.24)
GFR calc Af Amer: >60 mL/min (ref >60)
GFR calc non Af Amer: >60 mL/min (ref >60)
Glucose, Bld: 170 mg/dL — ABNORMAL HIGH (ref 70–99)
Potassium: 4.1 mmol/L (ref 3.5–5.1)
Sodium: 143 mmol/L (ref 135–145)

## 2019-03-03 LAB — GLUCOSE, CAPILLARY
Glucose-Capillary: 203 mg/dL — ABNORMAL HIGH (ref 70–99)
Glucose-Capillary: 231 mg/dL — ABNORMAL HIGH (ref 70–99)
Glucose-Capillary: 276 mg/dL — ABNORMAL HIGH (ref 70–99)
Glucose-Capillary: 320 mg/dL — ABNORMAL HIGH (ref 70–99)

## 2019-03-03 LAB — MAGNESIUM: Magnesium: 2.1 mg/dL (ref 1.7–2.4)

## 2019-03-03 MED ORDER — METHYLPREDNISOLONE SODIUM SUCC 40 MG IJ SOLR
40.0000 mg | Freq: Every day | INTRAMUSCULAR | Status: DC
  Administered 2019-03-04: 40 mg via INTRAVENOUS
  Filled 2019-03-03: qty 1

## 2019-03-03 NOTE — Progress Notes (Signed)
Tele called to report 8 beat run of Vtach, then NSR. Then another 3 beats VT followed by NSR. Then another 3 beats VT. Pt now in NSR. When asked about chest pain he reports it has been present for the last 3-4 months. On-call provider made aware. Will continue to monitor.

## 2019-03-03 NOTE — Progress Notes (Signed)
PROGRESS NOTE    [name]  [id number] DOB: February 01, 1941 DOA: 02/25/2019 PCP: Reubin Milan, MD   Brief Narrative:  78 year old with history of COPD on 2 L nasal cannula, chronic hypoxia, GERD, atrial fibrillation, CAD status post CABG, hyperlipidemia, history of tobacco use presented to the hospital with shortness of breath.  Initially diagnosed with COPD exacerbation with possible pneumonia.  He was started on steroids, antibiotics and bronchodilators.   Assessment & Plan:   Principal Problem:   Shortness of breath Active Problems:   Hyperlipidemia   ANEMIA, B12 DEFICIENCY   TOBACCO USE   Essential hypertension   Hx of CABG   Atrial fibrillation (HCC)   GERD (gastroesophageal reflux disease)   COPD (chronic obstructive pulmonary disease) (HCC)   CAD (coronary artery disease)   Chronic respiratory failure with hypoxia (HCC)  Acute on chronic hypoxic respiratory failure, 3 L nasal cannula.  Baseline uses 2 L nasal cannula Acute COPD exacerbation -Continue bronchodilators.  Procalcitonin negative, stop antibiotics -Supportive care.  Change Solu-Medrol to daily -COVID-19-negative  Elevated BNP Acute diastolic congestive heart failure, ejection fraction 60 to 65%.  Class III -Stop IV fluids.  40 mg IV Lasix twice daily -Echocardiogram 12/5-EF 60 to 65% with dilated cardiomyopathy. -Chest x-ray-pleural effusion and mild opacity.  Syncope, unknown etiology -CT head negative.  Cardiac enzymes negative. -Continue monitor.  Echo reviewed  Chronic diastolic congestive heart failure -Echocardiogram 07/2017-normal ejection fraction.  Paroxysmal atrial fibrillation -Rate control on Cardizem.  Anticoagulation-not on it secondary to fall risk  Diabetes mellitus type 2 -Insulin sliding scale Accu-Chek.  Hemoglobin A1c 6.7 -Lantus 22 units  History of dementia -On Namenda.  Klonopin as needed  GERD -PPI  Hyperlipidemia -Statin  PT-recommending home health   DVT  prophylaxis: Lovenox Code Status: DNR Family Communication: None at bedside Disposition Plan: Still on 3 L nasal cannula and has significant amount of exertional dyspnea.  Maintain hospital stay for IV diuretics   Subjective: Feels slightly better than yesterday but still has significant amount of exertional dyspnea.  Responded well to IV Lasix yesterday.  Review of Systems Otherwise negative except as per HPI, including: General = no fevers, chills, dizziness, malaise, fatigue HEENT/EYES = negative for pain, redness, loss of vision, double vision, blurred vision, loss of hearing, sore throat, hoarseness, dysphagia Cardiovascular= negative for chest pain, palpitation, murmurs, lower extremity swelling Respiratory/lungs= negative for cough, hemoptysis, wheezing, mucus production Gastrointestinal= negative for nausea, vomiting,, abdominal pain, melena, hematemesis Genitourinary= negative for Dysuria, Hematuria, Change in Urinary Frequency MSK = Negative for arthralgia, myalgias, Back Pain, Joint swelling  Neurology= Negative for headache, seizures, numbness, tingling  Psychiatry= Negative for anxiety, depression, suicidal and homocidal ideation Allergy/Immunology= Medication/Food allergy as listed  Skin= Negative for Rash, lesions, ulcers, itching  Objective: Vitals:   03/02/19 2311 03/03/19 0356 03/03/19 0738 03/03/19 0814  BP: 123/68 (!) 142/76 132/89   Pulse: 76 81 86   Resp: 19 18 16    Temp: 98 F (36.7 C) 97.6 F (36.4 C) 97.7 F (36.5 C)   TempSrc:  Oral Axillary   SpO2: 93% 98% 90% 94%  Weight:      Height:        Intake/Output Summary (Last 24 hours) at 03/03/2019 1007 Last data filed at 03/03/2019 0300 Gross per 24 hour  Intake -  Output 1850 ml  Net -1850 ml   Filed Weights   02/25/19 0939  Weight: 60.8 kg    Examination:  Constitutional: Not in acute distress, 3 L nasal  cannula Respiratory: Bibasilar crackles midway up the lung field Cardiovascular:  Normal sinus rhythm, no rubs Abdomen: Nontender nondistended good bowel sounds Musculoskeletal: No edema noted Skin: No rashes seen Neurologic: CN 2-12 grossly intact.  And nonfocal Psychiatric: Normal judgment and insight. Alert and oriented x 3. Normal mood.     Data Reviewed:   CBC: Recent Labs  Lab 02/25/19 0954  02/26/19 0712 02/27/19 0341 03/01/19 0248 03/02/19 0246 03/03/19 0301  WBC 9.1   < > 6.5 6.4 12.2* 10.9* 11.2*  NEUTROABS 7.8*  --   --   --   --   --   --   HGB 14.1   < > 13.4 13.4 12.8* 13.3 13.7  HCT 43.8   < > 41.1 40.9 39.5 40.7 42.1  MCV 102.8*   < > 102.2* 100.5* 100.5* 100.7* 102.2*  PLT 216   < > 196 196 194 197 209   < > = values in this interval not displayed.   Basic Metabolic Panel: Recent Labs  Lab 02/26/19 0712 02/27/19 0341 02/27/19 1154 03/01/19 0248 03/02/19 0246 03/03/19 0301  NA 141 139 139 140 141 143  K 3.5 3.9 4.1 4.4 4.3 4.1  CL 101 97* 99 102 101 99  CO2 30 31 28 29 30  35*  GLUCOSE 99 298* 421* 203* 218* 170*  BUN 14 14 14 20  30* 26*  CREATININE 0.92 1.08 1.08 0.99 1.13 1.02  CALCIUM 8.9 8.7* 8.9 8.7* 8.1* 8.2*  MG 1.9  --   --   --  2.4 2.1   GFR: Estimated Creatinine Clearance: 51.3 mL/min (by C-G formula based on SCr of 1.02 mg/dL). Liver Function Tests: No results for input(s): AST, ALT, ALKPHOS, BILITOT, PROT, ALBUMIN in the last 168 hours. No results for input(s): LIPASE, AMYLASE in the last 168 hours. No results for input(s): AMMONIA in the last 168 hours. Coagulation Profile: No results for input(s): INR, PROTIME in the last 168 hours. Cardiac Enzymes: No results for input(s): CKTOTAL, CKMB, CKMBINDEX, TROPONINI in the last 168 hours. BNP (last 3 results) No results for input(s): PROBNP in the last 8760 hours. HbA1C: No results for input(s): HGBA1C in the last 72 hours. CBG: Recent Labs  Lab 03/02/19 0619 03/02/19 1223 03/02/19 1718 03/02/19 2125 03/03/19 0606  GLUCAP 200* 381* 281* 147* 203*   Lipid  Profile: No results for input(s): CHOL, HDL, LDLCALC, TRIG, CHOLHDL, LDLDIRECT in the last 72 hours. Thyroid Function Tests: No results for input(s): TSH, T4TOTAL, FREET4, T3FREE, THYROIDAB in the last 72 hours. Anemia Panel: No results for input(s): VITAMINB12, FOLATE, FERRITIN, TIBC, IRON, RETICCTPCT in the last 72 hours. Sepsis Labs: Recent Labs  Lab 03/02/19 0939  PROCALCITON <0.10    Recent Results (from the past 240 hour(s))  Culture, blood (routine x 2) Call MD if unable to obtain prior to antibiotics being given     Status: None   Collection Time: 02/25/19  3:19 PM   Specimen: BLOOD LEFT FOREARM  Result Value Ref Range Status   Specimen Description BLOOD LEFT FOREARM  Final   Special Requests   Final    BOTTLES DRAWN AEROBIC AND ANAEROBIC Blood Culture results may not be optimal due to an excessive volume of blood received in culture bottles   Culture   Final    NO GROWTH 5 DAYS Performed at [facility], [place] [address], [place], [place] [address]    Report Status 03/02/2019 FINAL  Final  Culture, blood (routine x 2) Call  MD if unable to obtain prior to antibiotics being given     Status: None   Collection Time: 02/25/19  3:27 PM   Specimen: BLOOD LEFT WRIST  Result Value Ref Range Status   Specimen Description BLOOD LEFT WRIST  Final   Special Requests   Final    BOTTLES DRAWN AEROBIC AND ANAEROBIC Blood Culture adequate volume   Culture   Final    NO GROWTH 5 DAYS Performed at [facility], [address] [place], [place] [address]    Report Status 03/02/2019 FINAL  Final  SARS CORONAVIRUS 2 (TAT 6-24 HRS) Nasopharyngeal Nasopharyngeal Swab     Status: None   Collection Time: 02/25/19  9:15 PM   Specimen: Nasopharyngeal Swab  Result Value Ref Range Status   SARS Coronavirus 2 NEGATIVE NEGATIVE Final    Comment: (NOTE) SARS-CoV-2 target nucleic acids are NOT DETECTED. The SARS-CoV-2 RNA is generally detectable in upper and lower respiratory  specimens during the acute phase of infection. Negative results do not preclude SARS-CoV-2 infection, do not rule out co-infections with other pathogens, and should not be used as the sole basis for treatment or other patient management decisions. Negative results must be combined with clinical observations, patient history, and epidemiological information. The expected result is Negative. Fact Sheet for Patients: SugarRoll.be Fact Sheet for Healthcare Providers: https://www.woods-mathews.com/ This test is not yet approved or cleared by the [place] FDA and  has been authorized for detection and/or diagnosis of SARS-CoV-2 by FDA under an Emergency Use Authorization (EUA). This EUA will remain  in effect (meaning this test can be used) for the duration of the COVID-19 declaration under Section 56 4(b)(1) of the Act, 21 U.S.C. section 360bbb-3(b)(1), unless the authorization is terminated or revoked sooner. Performed at [facility], [place] [address], [place], [place] [address]   Culture, sputum-assessment     Status: None   Collection Time: 02/26/19  2:53 PM   Specimen: Expectorated Sputum  Result Value Ref Range Status   Specimen Description EXPECTORATED SPUTUM  Final   Special Requests NONE  Final   Sputum evaluation   Final    THIS SPECIMEN IS ACCEPTABLE FOR SPUTUM CULTURE Performed at [facility], [place] [address], [place], [place] [address]    Report Status 02/26/2019 FINAL  Final  Culture, respiratory     Status: None   Collection Time: 02/26/19  2:53 PM  Result Value Ref Range Status   Specimen Description EXPECTORATED SPUTUM  Final   Special Requests NONE Reflexed from [id number]  Final   Gram Stain   Final    FEW WBC PRESENT, PREDOMINANTLY PMN FEW GRAM POSITIVE COCCI FEW GRAM POSITIVE RODS    Culture   Final    Consistent with normal respiratory flora. Performed at [facility], [place] [address],  [place], [place] [address]    Report Status 02/28/2019 FINAL  Final  MRSA PCR Screening     Status: None   Collection Time: 02/27/19 12:32 PM   Specimen: Nasopharyngeal  Result Value Ref Range Status   MRSA by PCR NEGATIVE NEGATIVE Final    Comment:        The GeneXpert MRSA Assay (FDA approved for NASAL specimens only), is one component of a comprehensive MRSA colonization surveillance program. It is not intended to diagnose MRSA infection nor to guide or monitor treatment for MRSA infections. Performed at [facility], [place] [address] [place], [place] [address]  Radiology Studies: No results found.      Scheduled Meds: . arformoterol  15 mcg Nebulization BID  . atorvastatin  40 mg Oral QHS  . budesonide  0.5 mg Nebulization BID  . clonazePAM  0.5 mg Oral QHS  . diltiazem  180 mg Oral q morning - 10a  . enoxaparin (LOVENOX) injection  40 mg Subcutaneous Q24H  . ferrous gluconate  324 mg Oral BID WC  . furosemide  40 mg Intravenous BID  . guaiFENesin  400 mg Oral BID  . insulin aspart  0-20 Units Subcutaneous TID WC  . insulin aspart  0-5 Units Subcutaneous QHS  . insulin glargine  22 Units Subcutaneous Daily  . ipratropium-albuterol  3 mL Nebulization TID  . Melatonin  6 mg Oral QHS  . memantine  20 mg Oral QHS  . methylPREDNISolone (SOLU-MEDROL) injection  40 mg Intravenous Q12H  . pantoprazole  40 mg Oral BID   Continuous Infusions:   LOS: 6 days   Time spent= 30 mins     Arsenio Loader, MD [facility]  If 7PM-7AM, please contact night-coverage  03/03/2019, 10:07 AM

## 2019-03-04 DIAGNOSIS — R0602 Shortness of breath: Secondary | ICD-10-CM | POA: Diagnosis not present

## 2019-03-04 LAB — CBC
HCT: 43.7 % (ref 39.0–52.0)
Hemoglobin: 14.3 g/dL (ref 13.0–17.0)
MCH: 33.1 pg (ref 26.0–34.0)
MCHC: 32.7 g/dL (ref 30.0–36.0)
MCV: 101.2 fL — ABNORMAL HIGH (ref 80.0–100.0)
Platelets: 196 10*3/uL (ref 150–400)
RBC: 4.32 MIL/uL (ref 4.22–5.81)
RDW: 13.5 % (ref 11.5–15.5)
WBC: 11.4 10*3/uL — ABNORMAL HIGH (ref 4.0–10.5)
nRBC: 0.3 % — ABNORMAL HIGH (ref 0.0–0.2)

## 2019-03-04 LAB — BASIC METABOLIC PANEL
Anion gap: 9 (ref 5–15)
BUN: 31 mg/dL — ABNORMAL HIGH (ref 8–23)
CO2: 34 mmol/L — ABNORMAL HIGH (ref 22–32)
Calcium: 8.5 mg/dL — ABNORMAL LOW (ref 8.9–10.3)
Chloride: 98 mmol/L (ref 98–111)
Creatinine, Ser: 0.84 mg/dL (ref 0.61–1.24)
GFR calc Af Amer: >60 mL/min (ref >60)
GFR calc non Af Amer: >60 mL/min (ref >60)
Glucose, Bld: 128 mg/dL — ABNORMAL HIGH (ref 70–99)
Potassium: 3.5 mmol/L (ref 3.5–5.1)
Sodium: 141 mmol/L (ref 135–145)

## 2019-03-04 LAB — GLUCOSE, CAPILLARY
Glucose-Capillary: 122 mg/dL — ABNORMAL HIGH (ref 70–99)
Glucose-Capillary: 201 mg/dL — ABNORMAL HIGH (ref 70–99)
Glucose-Capillary: 159 mg/dL — ABNORMAL HIGH (ref 70–99)
Glucose-Capillary: 292 mg/dL — ABNORMAL HIGH (ref 70–99)

## 2019-03-04 LAB — MAGNESIUM: Magnesium: 2.2 mg/dL (ref 1.7–2.4)

## 2019-03-04 MED ORDER — INSULIN ASPART 100 UNIT/ML ~~LOC~~ SOLN
3.0000 [IU] | Freq: Three times a day (TID) | SUBCUTANEOUS | Status: DC
  Administered 2019-03-04 – 2019-03-05 (×3): 3 [IU] via SUBCUTANEOUS

## 2019-03-04 MED ORDER — POTASSIUM CHLORIDE CRYS ER 20 MEQ PO TBCR
40.0000 meq | EXTENDED_RELEASE_TABLET | Freq: Once | ORAL | Status: AC
  Administered 2019-03-04: 40 meq via ORAL
  Filled 2019-03-04: qty 2

## 2019-03-04 MED ORDER — FUROSEMIDE 10 MG/ML IJ SOLN
40.0000 mg | Freq: Two times a day (BID) | INTRAMUSCULAR | Status: AC
  Administered 2019-03-04: 40 mg via INTRAVENOUS
  Filled 2019-03-04: qty 4

## 2019-03-04 MED ORDER — PREDNISONE 5 MG PO TABS
10.0000 mg | ORAL_TABLET | Freq: Every day | ORAL | Status: DC
  Administered 2019-03-05: 10 mg via ORAL
  Filled 2019-03-04: qty 2

## 2019-03-04 NOTE — Progress Notes (Signed)
Occupational Therapy Treatment Patient Details Name: [name] MRN: [id number] DOB: 09-20-1940 Today's Date: 03/04/2019    History of present illness [name] is a 78 y.o. male with medical history significant of COPD on 2 L of oxygen via nasal cannula, hypertension, coronary artery disease status post CABG, hyperlipidemia, A. fib, chronic hypoxic respiratory failure due to COPD presents to emergency department due to shortness of breath. Pt also with confusion and fell morning of presentation. CT of head neg.    OT comments  Pt making continued progress this session. Pt currently on 2L of O2 via [place] throughout session. Pt utilized level 1 theraband for strengthening exercises for B UEs and endurance. Pt's O2 dropping to 86-88% at rep 6 out of 10 but able to recover ~20 seconds of pursed lip breathing back up to 93%. Pt requests to return home and is likely close to baseline with oxygen needs. OT recommends HHOT follow up and can return home if he has 24/7 from family. Pt continues to benefit from OT intervention.    Follow Up Recommendations  Home health OT;Supervision/Assistance - 24 hour    Equipment Recommendations    none recommended. He has equipment at home.       Precautions / Restrictions Precautions Precautions: Other (comment) Precaution Comments: monitor SpO2 closely Restrictions Weight Bearing Restrictions: No          Balance Overall balance assessment: Needs assistance Sitting-balance support: Feet supported;No upper extremity supported Sitting balance-Leahy Scale: Fair          ADL either performed or assessed with clinical judgement     Cognition Arousal/Alertness: Awake/alert Behavior During Therapy: WFL for tasks assessed/performed Overall Cognitive Status: Impaired/Different from baseline Area of Impairment: Memory                     Memory: Decreased short-term memory                             Pertinent Vitals/ Pain        Pain Assessment: No/denies pain         Frequency  Min 2X/week        Progress Toward Goals  OT Goals(current goals can now be found in the care plan section)  Progress towards OT goals: Progressing toward goals  Acute Rehab OT Goals Patient Stated Goal: return home OT Goal Formulation: With patient Time For Goal Achievement: 03/18/19 Potential to Achieve Goals: Good  Plan Discharge plan remains appropriate       AM-PAC OT "6 Clicks" Daily Activity     Outcome Measure   Help from another person eating meals?: None Help from another person taking care of personal grooming?: A Little Help from another person toileting, which includes using toliet, bedpan, or urinal?: A Lot Help from another person bathing (including washing, rinsing, drying)?: A Lot Help from another person to put on and taking off regular upper body clothing?: A Little Help from another person to put on and taking off regular lower body clothing?: A Lot 6 Click Score: 16    End of Session    OT Visit Diagnosis: Muscle weakness (generalized) (M62.81)   Activity Tolerance Patient limited by fatigue   Patient Left in bed;with call bell/phone within reach;with bed alarm set   Nurse Communication Mobility status        Time: [id number] OT Time Calculation (min): 18 min  Charges: OT General Charges $  OT Visit: 1 Visit OT Treatments $Therapeutic Exercise: 8-22 mins   Gypsy Decant MS, OTR/L 03/04/2019, 9:54 AM

## 2019-03-04 NOTE — Progress Notes (Signed)
Physical Therapy Treatment Patient Details Name: [name] MRN: [id number] DOB: May 07, 1940 Today's Date: 03/04/2019    History of Present Illness [name] is a 78 y.o. male with medical history significant of COPD on 2 L of oxygen via nasal cannula, hypertension, coronary artery disease status post CABG, hyperlipidemia, A. fib, chronic hypoxic respiratory failure due to COPD presents to emergency department due to shortness of breath. Pt also with confusion and fell morning of presentation. CT of head neg.     PT Comments    Pt performed bed level session as on arrival he presents with fatigue and DOE.  Pt speaking in very low tone compared to last session.  SPO2 88%-93% on 3L [place].  Pt continues to be limited due to respiratory function.  He reports feeling sweaty and requested for his temperature to be taken.  PTA took temp 97.6 and reported findings to RN.  Continue with plan to return home.     Follow Up Recommendations  Home health PT;Supervision for mobility/OOB     Equipment Recommendations  None recommended by PT    Recommendations for Other Services       Precautions / Restrictions Precautions Precautions: Other (comment) Precaution Comments: monitor SpO2 closely Restrictions Weight Bearing Restrictions: No    Mobility  Bed Mobility Overal bed mobility: Needs Assistance             General bed mobility comments: Min assistance to reposition in bed and scoot to [facility]  Transfers                    Ambulation/Gait                 Stairs             Wheelchair Mobility    Modified Rankin (Stroke Patients Only)       Balance                                            Cognition Arousal/Alertness: Awake/alert Behavior During Therapy: WFL for tasks assessed/performed Overall Cognitive Status: Impaired/Different from baseline Area of Impairment: Memory                     Memory: Decreased short-term  memory         General Comments: Pt alert and oriented x4. more motivated to progress this session.      Exercises General Exercises - Lower Extremity Ankle Circles/Pumps: AROM;Both;20 reps;Supine Quad Sets: AROM;Both;10 reps;Supine Heel Slides: AROM;Both;10 reps;Supine Hip ABduction/ADduction: AROM;Both;10 reps;Supine Other Exercises Other Exercises: IS 1x10 reps.  ranged from 530ml-1250ml but 70% of the time he hit 750 ml.    General Comments        Pertinent Vitals/Pain Pain Assessment: No/denies pain    Home Living                      Prior Function            PT Goals (current goals can now be found in the care plan section) Acute Rehab PT Goals Patient Stated Goal: return home Potential to Achieve Goals: Fair Progress towards PT goals: Progressing toward goals    Frequency    Min 3X/week      PT Plan Current plan remains appropriate    Co-evaluation  AM-PAC PT "6 Clicks" Mobility   Outcome Measure  Help needed turning from your back to your side while in a flat bed without using bedrails?: None Help needed moving from lying on your back to sitting on the side of a flat bed without using bedrails?: None Help needed moving to and from a bed to a chair (including a wheelchair)?: A Little Help needed standing up from a chair using your arms (e.g., wheelchair or bedside chair)?: A Little Help needed to walk in hospital room?: A Little Help needed climbing 3-5 steps with a railing? : A Little 6 Click Score: 20    End of Session Equipment Utilized During Treatment: Oxygen(3L [place]) Activity Tolerance: Patient tolerated treatment well Patient left: in bed;with call bell/phone within reach(bed alarm would not set.) Nurse Communication: Mobility status PT Visit Diagnosis: Unsteadiness on feet (R26.81);Muscle weakness (generalized) (M62.81)     Time: [id number] PT Time Calculation (min) (ACUTE ONLY): 15 min  Charges:   $Therapeutic Exercise: 8-22 mins                     [name] , PTA Acute Rehabilitation Services Pager [phone] Office [phone]     [name] 03/04/2019, 6:23 PM

## 2019-03-04 NOTE — Progress Notes (Signed)
PROGRESS NOTE    [name]  [id number] DOB: 1941/03/01 DOA: 02/25/2019 PCP: Reubin Milan, MD   Brief Narrative:  78 year old with history of COPD on 2 L nasal cannula, chronic hypoxia, GERD, atrial fibrillation, CAD status post CABG, hyperlipidemia, history of tobacco use presented to the hospital with shortness of breath.  Initially diagnosed with COPD exacerbation with possible pneumonia.  He was started on steroids, antibiotics and bronchodilators.   Assessment & Plan:   Principal Problem:   Shortness of breath Active Problems:   Hyperlipidemia   ANEMIA, B12 DEFICIENCY   TOBACCO USE   Essential hypertension   Hx of CABG   Atrial fibrillation (HCC)   GERD (gastroesophageal reflux disease)   COPD (chronic obstructive pulmonary disease) (HCC)   CAD (coronary artery disease)   Chronic respiratory failure with hypoxia (HCC)  Acute on chronic hypoxic respiratory failure, down to 2 L nasal cannula this morning.  2 L baseline at home. Acute COPD exacerbation -Continue bronchodilators.  Procalcitonin negative, stop antibiotics -Supportive care.  Changed to oral prednisone -COVID-19-negative  Elevated BNP Acute diastolic congestive heart failure, ejection fraction 60 to 65%.  Class III -Lasix 40 mg IV 1 more dose later today. -Echocardiogram 12/5-EF 60 to 65% with dilated cardiomyopathy. -Chest x-ray-pleural effusion and mild opacity.  Syncope, unknown etiology -CT head negative.  Cardiac enzymes negative. -Continue monitor.  Echo reviewed  Chronic diastolic congestive heart failure -Echocardiogram 07/2017-normal ejection fraction.  Paroxysmal atrial fibrillation -Rate control on Cardizem.  Anticoagulation-not on it secondary to fall risk  Diabetes mellitus type 2 -Insulin sliding scale Accu-Chek.  Hemoglobin A1c 6.7 -Lantus 22 units.  NovoLog 3 units premeals added  History of dementia -On Namenda.  Klonopin as needed  GERD -PPI  Hyperlipidemia -Statin   PT-recommending home health   DVT prophylaxis: Lovenox Code Status: DNR Family Communication: Spoke with patient's son Disposition Plan: Maintain hospital stay for 24 hours for IV diuretics.  Hopefully we can discharge him tomorrow   Subjective: Symptomatically he feels much better and almost back to his baseline.  Still has very minimal exertional shortness of breath especially while working with therapy.  Spoke with his son over the phone.  Review of Systems Otherwise negative except as per HPI, including: General = no fevers, chills, dizziness, malaise, fatigue HEENT/EYES = negative for pain, redness, loss of vision, double vision, blurred vision, loss of hearing, sore throat, hoarseness, dysphagia Cardiovascular= negative for chest pain, palpitation, murmurs, lower extremity swelling Respiratory/lungs= negative for shortness of breath, cough, hemoptysis, wheezing, mucus production Gastrointestinal= negative for nausea, vomiting,, abdominal pain, melena, hematemesis Genitourinary= negative for Dysuria, Hematuria, Change in Urinary Frequency MSK = Negative for arthralgia, myalgias, Back Pain, Joint swelling  Neurology= Negative for headache, seizures, numbness, tingling  Psychiatry= Negative for anxiety, depression, suicidal and homocidal ideation Allergy/Immunology= Medication/Food allergy as listed  Skin= Negative for Rash, lesions, ulcers, itching  Objective: Vitals:   03/03/19 2322 03/04/19 0322 03/04/19 0704 03/04/19 0749  BP: 128/80 134/85 139/89   Pulse: 96 92 90   Resp: (!) 22 20 16    Temp: 98.1 F (36.7 C) 97.7 F (36.5 C) 98 F (36.7 C)   TempSrc: Oral Oral Oral   SpO2: 96% 95% 90% 96%  Weight:      Height:        Intake/Output Summary (Last 24 hours) at 03/04/2019 1131 Last data filed at 03/04/2019 1049 Gross per 24 hour  Intake 120 ml  Output 1975 ml  Net -1855 ml   Danley Danker  Weights   02/25/19 0939  Weight: 60.8 kg    Examination:  Constitutional:  Not in acute distress, 2 L nasal cannula Respiratory: Minimal bibasilar crackles Cardiovascular: Normal sinus rhythm, no rubs Abdomen: Nontender nondistended good bowel sounds Musculoskeletal: No edema noted Skin: No rashes seen Neurologic: CN 2-12 grossly intact.  And nonfocal Psychiatric: Normal judgment and insight. Alert and oriented x 3. Normal mood.     Data Reviewed:   CBC: Recent Labs  Lab 02/27/19 0341 03/01/19 0248 03/02/19 0246 03/03/19 0301 03/04/19 0406  WBC 6.4 12.2* 10.9* 11.2* 11.4*  HGB 13.4 12.8* 13.3 13.7 14.3  HCT 40.9 39.5 40.7 42.1 43.7  MCV 100.5* 100.5* 100.7* 102.2* 101.2*  PLT 196 194 197 209 [id number]   Basic Metabolic Panel: Recent Labs  Lab 02/26/19 0712  02/27/19 1154 03/01/19 0248 03/02/19 0246 03/03/19 0301 03/04/19 0406  NA 141   < > 139 140 141 143 141  K 3.5   < > 4.1 4.4 4.3 4.1 3.5  CL 101   < > 99 102 101 99 98  CO2 30   < > 28 29 30  35* 34*  GLUCOSE 99   < > 421* 203* 218* 170* 128*  BUN 14   < > 14 20 30* 26* 31*  CREATININE 0.92   < > 1.08 0.99 1.13 1.02 0.84  CALCIUM 8.9   < > 8.9 8.7* 8.1* 8.2* 8.5*  MG 1.9  --   --   --  2.4 2.1 2.2   < > = values in this interval not displayed.   GFR: Estimated Creatinine Clearance: 62.3 mL/min (by C-G formula based on SCr of 0.84 mg/dL). Liver Function Tests: No results for input(s): AST, ALT, ALKPHOS, BILITOT, PROT, ALBUMIN in the last 168 hours. No results for input(s): LIPASE, AMYLASE in the last 168 hours. No results for input(s): AMMONIA in the last 168 hours. Coagulation Profile: No results for input(s): INR, PROTIME in the last 168 hours. Cardiac Enzymes: No results for input(s): CKTOTAL, CKMB, CKMBINDEX, TROPONINI in the last 168 hours. BNP (last 3 results) No results for input(s): PROBNP in the last 8760 hours. HbA1C: No results for input(s): HGBA1C in the last 72 hours. CBG: Recent Labs  Lab 03/03/19 0606 03/03/19 1202 03/03/19 1609 03/03/19 2115 03/04/19 0613   GLUCAP 203* 231* 276* 320* 122*   Lipid Profile: No results for input(s): CHOL, HDL, LDLCALC, TRIG, CHOLHDL, LDLDIRECT in the last 72 hours. Thyroid Function Tests: No results for input(s): TSH, T4TOTAL, FREET4, T3FREE, THYROIDAB in the last 72 hours. Anemia Panel: No results for input(s): VITAMINB12, FOLATE, FERRITIN, TIBC, IRON, RETICCTPCT in the last 72 hours. Sepsis Labs: Recent Labs  Lab 03/02/19 0939  PROCALCITON <0.10    Recent Results (from the past 240 hour(s))  Culture, blood (routine x 2) Call MD if unable to obtain prior to antibiotics being given     Status: None   Collection Time: 02/25/19  3:19 PM   Specimen: BLOOD LEFT FOREARM  Result Value Ref Range Status   Specimen Description BLOOD LEFT FOREARM  Final   Special Requests   Final    BOTTLES DRAWN AEROBIC AND ANAEROBIC Blood Culture results may not be optimal due to an excessive volume of blood received in culture bottles   Culture   Final    NO GROWTH 5 DAYS Performed at [facility], [place] [address] [place], [place] [address]    Report Status 03/02/2019 FINAL  Final  Culture, blood (routine  x 2) Call MD if unable to obtain prior to antibiotics being given     Status: None   Collection Time: 02/25/19  3:27 PM   Specimen: BLOOD LEFT WRIST  Result Value Ref Range Status   Specimen Description BLOOD LEFT WRIST  Final   Special Requests   Final    BOTTLES DRAWN AEROBIC AND ANAEROBIC Blood Culture adequate volume   Culture   Final    NO GROWTH 5 DAYS Performed at [facility], [address] [place], [place] [address]    Report Status 03/02/2019 FINAL  Final  SARS CORONAVIRUS 2 (TAT 6-24 HRS) Nasopharyngeal Nasopharyngeal Swab     Status: None   Collection Time: 02/25/19  9:15 PM   Specimen: Nasopharyngeal Swab  Result Value Ref Range Status   SARS Coronavirus 2 NEGATIVE NEGATIVE Final    Comment: (NOTE) SARS-CoV-2 target nucleic acids are NOT DETECTED. The SARS-CoV-2 RNA is generally  detectable in upper and lower respiratory specimens during the acute phase of infection. Negative results do not preclude SARS-CoV-2 infection, do not rule out co-infections with other pathogens, and should not be used as the sole basis for treatment or other patient management decisions. Negative results must be combined with clinical observations, patient history, and epidemiological information. The expected result is Negative. Fact Sheet for Patients: SugarRoll.be Fact Sheet for Healthcare Providers: https://www.woods-mathews.com/ This test is not yet approved or cleared by the [place] FDA and  has been authorized for detection and/or diagnosis of SARS-CoV-2 by FDA under an Emergency Use Authorization (EUA). This EUA will remain  in effect (meaning this test can be used) for the duration of the COVID-19 declaration under Section 56 4(b)(1) of the Act, 21 U.S.C. section 360bbb-3(b)(1), unless the authorization is terminated or revoked sooner. Performed at [facility], [place] [address], [place], [place] [address]   Culture, sputum-assessment     Status: None   Collection Time: 02/26/19  2:53 PM   Specimen: Expectorated Sputum  Result Value Ref Range Status   Specimen Description EXPECTORATED SPUTUM  Final   Special Requests NONE  Final   Sputum evaluation   Final    THIS SPECIMEN IS ACCEPTABLE FOR SPUTUM CULTURE Performed at [facility], [place] [address], [place], [place] [address]    Report Status 02/26/2019 FINAL  Final  Culture, respiratory     Status: None   Collection Time: 02/26/19  2:53 PM  Result Value Ref Range Status   Specimen Description EXPECTORATED SPUTUM  Final   Special Requests NONE Reflexed from [id number]  Final   Gram Stain   Final    FEW WBC PRESENT, PREDOMINANTLY PMN FEW GRAM POSITIVE COCCI FEW GRAM POSITIVE RODS    Culture   Final    Consistent with normal respiratory flora. Performed at [facility], [place] [address], [place],  [address]    Report Status 02/28/2019 FINAL  Final  MRSA PCR Screening     Status: None   Collection Time: 02/27/19 12:32 PM   Specimen: Nasopharyngeal  Result Value Ref Range Status   MRSA by PCR NEGATIVE NEGATIVE Final    Comment:        The GeneXpert MRSA Assay (FDA approved for NASAL specimens only), is one component of a comprehensive MRSA colonization surveillance program. It is not intended to diagnose MRSA infection nor to guide or monitor treatment for MRSA infections. Performed at [facility], [place] [address] [place], [place]  [place]          Radiology Studies: No results found.      Scheduled Meds: . arformoterol  15 mcg Nebulization BID  . atorvastatin  40 mg Oral QHS  . budesonide  0.5 mg Nebulization BID  . clonazePAM  0.5 mg Oral QHS  . diltiazem  180 mg Oral q morning - 10a  . enoxaparin (LOVENOX) injection  40 mg Subcutaneous Q24H  . ferrous gluconate  324 mg Oral BID WC  . furosemide  40 mg Intravenous BID  . guaiFENesin  400 mg Oral BID  . insulin aspart  0-20 Units Subcutaneous TID WC  . insulin aspart  0-5 Units Subcutaneous QHS  . insulin glargine  22 Units Subcutaneous Daily  . ipratropium-albuterol  3 mL Nebulization TID  . Melatonin  6 mg Oral QHS  . memantine  20 mg Oral QHS  . methylPREDNISolone (SOLU-MEDROL) injection  40 mg Intravenous Daily  . pantoprazole  40 mg Oral BID   Continuous Infusions:   LOS: 7 days   Time spent= 30 Mr. [place] mins    Ankit Arsenio Loader, MD [facility]  If 7PM-7AM, please contact night-coverage  03/04/2019, 11:31 AM

## 2019-03-04 NOTE — Care Management Important Message (Signed)
Important Message  Patient Details  Name: [name] MRN: [id number] Date of Birth: 02-20-1941   Medicare Important Message Given:  Yes     Orbie Pyo 03/04/2019, 4:54 PM

## 2019-03-04 NOTE — Progress Notes (Signed)
Inpatient Diabetes Program Recommendations  AACE/ADA: New Consensus Statement on Inpatient Glycemic Control (2015)  Target Ranges:  Prepandial:   less than 140 mg/dL      Peak postprandial:   less than 180 mg/dL (1-2 hours)      Critically ill patients:  140 - 180 mg/dL   Lab Results  Component Value Date   GLUCAP 122 (H) 03/04/2019   HGBA1C 6.7 (H) 02/25/2019    Review of Glycemic Control Results for DARL, FRUEHAUF (MRN [id number]) as of 03/04/2019 10:14  Ref. Range 03/03/2019 06:06 03/03/2019 12:02 03/03/2019 16:09 03/03/2019 21:15 03/04/2019 06:13  Glucose-Capillary Latest Ref Range: 70 - 99 mg/dL 203 (H) 231 (H) 276 (H) 320 (H) 122 (H)   Diabetes history: DM 2 Outpatient Diabetes medications:  None Current orders for Inpatient glycemic control:  Novolog resistant tid with meals and HS Lantus 22 units daily Solumedrol 40 mg IV q 24 hours Inpatient Diabetes Program Recommendations:    Please add Novolog meal coverage 3 units tid with meals (hold if patient eats less than 50%).   Thanks  Adah Perl, RN, BC-ADM Inpatient Diabetes Coordinator Pager [phone]

## 2019-03-04 NOTE — Progress Notes (Signed)
Tele called to report a 9 beat run of Vtach. Pt assessed and asymptomatic. On-call provider notified. Will continue to monitor.

## 2019-03-05 ENCOUNTER — Other Ambulatory Visit: Payer: Self-pay

## 2019-03-05 DIAGNOSIS — R0602 Shortness of breath: Secondary | ICD-10-CM | POA: Diagnosis not present

## 2019-03-05 LAB — BASIC METABOLIC PANEL
Anion gap: 10 (ref 5–15)
BUN: 32 mg/dL — ABNORMAL HIGH (ref 8–23)
CO2: 33 mmol/L — ABNORMAL HIGH (ref 22–32)
Calcium: 8.5 mg/dL — ABNORMAL LOW (ref 8.9–10.3)
Chloride: 97 mmol/L — ABNORMAL LOW (ref 98–111)
Creatinine, Ser: 1.02 mg/dL (ref 0.61–1.24)
GFR calc Af Amer: >60 mL/min (ref >60)
GFR calc non Af Amer: >60 mL/min (ref >60)
Glucose, Bld: 130 mg/dL — ABNORMAL HIGH (ref 70–99)
Potassium: 4.1 mmol/L (ref 3.5–5.1)
Sodium: 140 mmol/L (ref 135–145)

## 2019-03-05 LAB — CBC
HCT: 43.7 % (ref 39.0–52.0)
Hemoglobin: 14.5 g/dL (ref 13.0–17.0)
MCH: 33 pg (ref 26.0–34.0)
MCHC: 33.2 g/dL (ref 30.0–36.0)
MCV: 99.3 fL (ref 80.0–100.0)
Platelets: 173 10*3/uL (ref 150–400)
RBC: 4.4 MIL/uL (ref 4.22–5.81)
RDW: 13.6 % (ref 11.5–15.5)
WBC: 10.5 10*3/uL (ref 4.0–10.5)
nRBC: 0.3 % — ABNORMAL HIGH (ref 0.0–0.2)

## 2019-03-05 LAB — GLUCOSE, CAPILLARY: Glucose-Capillary: 175 mg/dL — ABNORMAL HIGH (ref 70–99)

## 2019-03-05 LAB — MAGNESIUM: Magnesium: 2.1 mg/dL (ref 1.7–2.4)

## 2019-03-05 MED ORDER — PREDNISONE 20 MG PO TABS: 40.0000 mg | ORAL_TABLET | Freq: Every day | ORAL | 0 refills | Status: AC

## 2019-03-05 NOTE — Plan of Care (Signed)

## 2019-03-05 NOTE — TOC Transition Note (Signed)
Transition of Care [facility]) - CM/SW Discharge Note   Patient Details  Name: DAWN AVILA MRN: [id number] Date of Birth: 18-Dec-1940  Transition of Care [facility] CM/SW Contact:  Pollie Friar, RN Phone Number: 03/05/2019, 10:23 AM   Clinical Narrative:    Pt is discharging home with [facility] services through [facility]. Cory with [facility] aware of d/c.  Pts son to provide transport home.    Final next level of care: Home w Home Health Services Barriers to Discharge: No Barriers Identified   Patient Goals and CMS Choice   CMS Medicare.gov Compare Post Acute Care list provided to:: Patient Represenative (must comment) Choice offered to / list presented to : Adult Children  Discharge Placement                       Discharge Plan and Services   Discharge Planning Services: CM Consult                      HH Arranged: PT, OT, Nurse's Aide, RN [place] Agency: [facility]     Representative spoke with at [place]: Tommi Rumps aware of d/c home today  Social Determinants of Health (SDOH) Interventions     Readmission Risk Interventions No flowsheet data found.

## 2019-03-05 NOTE — Progress Notes (Signed)
Nsg Discharge Note  Admit Date:  02/25/2019 Discharge date: 03/05/2019   EWEL IKNER to be D/C'd home with home health per MD order.  AVS completed. Patient/caregiver able to verbalize understanding.  Discharge Medication: Allergies as of 03/05/2019      Reactions   Ticlopidine Hcl Swelling   Lorazepam Other (See Comments)   Altered mental status from 2 doses of IV Ativan on 05/10/14 HALLUCINATIONS AND DELIRIOUS   Other Other (See Comments)   Narcotics: Hallucinations and paranoia   Prednisone Other (See Comments)   NOT TRUE ALLERGY - CONFUSION      Medication List    TAKE these medications   acetaminophen 500 MG tablet Commonly known as: TYLENOL Take 1,000 mg by mouth every 6 (six) hours as needed for headache (pain).   Alogliptin Benzoate 12.5 MG Tabs Take 12.5 mg by mouth every morning.   arformoterol 15 MCG/2ML Nebu Commonly known as: BROVANA Take 15 mcg by nebulization 2 (two) times daily.   atorvastatin 40 MG tablet Commonly known as: LIPITOR Take 40 mg by mouth at bedtime.   azithromycin 250 MG tablet Commonly known as: ZITHROMAX Take 500 mg by mouth every Monday, Wednesday, and Friday.   benzonatate 100 MG capsule Commonly known as: TESSALON Take 1 capsule (100 mg total) by mouth 3 (three) times daily.   budesonide 0.5 MG/2ML nebulizer solution Commonly known as: PULMICORT Take 0.5 mg by nebulization 2 (two) times daily.   Calcium 600+D 600-400 MG-UNIT tablet Generic drug: Calcium Carbonate-Vitamin D Take 1 tablet by mouth 2 (two) times daily.   clonazePAM 0.5 MG tablet Commonly known as: KLONOPIN Take 0.5 mg by mouth at bedtime.   diltiazem 180 MG 24 hr capsule Commonly known as: CARDIZEM CD Take 180 mg by mouth every morning.   famotidine 40 MG tablet Commonly known as: PEPCID Take 40 mg by mouth at bedtime.   feeding supplement (GLUCERNA SHAKE) Liqd Take 237 mLs by mouth 3 (three) times daily between meals.   ferrous gluconate 324 MG  tablet Commonly known as: FERGON Take 324 mg by mouth 2 (two) times daily with a meal.   furosemide 20 MG tablet Commonly known as: LASIX Take 20 mg by mouth every morning.   guaifenesin 400 MG Tabs tablet Commonly known as: HUMIBID E Take 400 mg by mouth 2 (two) times daily.   guaiFENesin-dextromethorphan 100-10 MG/5ML syrup Commonly known as: ROBITUSSIN DM Take 5 mLs by mouth 3 (three) times daily as needed for cough.   ipratropium-albuterol 0.5-2.5 (3) MG/3ML Soln Commonly known as: DUONEB Take 3 mLs by nebulization 4 (four) times daily.   LACTOBACILLUS PO Take 1 capsule by mouth 2 (two) times daily.   Melatonin 3 MG Tabs Take 6 mg by mouth at bedtime.   memantine 10 MG tablet Commonly known as: NAMENDA Take 20 mg by mouth at bedtime.   OXYGEN Inhale 3 L into the lungs continuous.   pantoprazole 40 MG tablet Commonly known as: PROTONIX TAKE 1 TABLET BY MOUTH TWICE A DAY   potassium chloride SA 20 MEQ tablet Commonly known as: KLOR-CON Take 20 mEq by mouth every morning.   predniSONE 20 MG tablet Commonly known as: DELTASONE Take 2 tablets (40 mg total) by mouth daily with breakfast for 3 days.   umeclidinium-vilanterol 62.5-25 MCG/INH Aepb Commonly known as: ANORO ELLIPTA Inhale 1 puff into the lungs daily.   ZyrTEC Allergy 10 MG tablet Generic drug: cetirizine Take 10 mg by mouth every morning.  Discharge Assessment: Vitals:   03/05/19 0805 03/05/19 0831  BP:  112/82  Pulse:  92  Resp:  (!) 24  Temp:  98.1 F (36.7 C)  SpO2: 93% 98%   Skin clean, dry and intact without evidence of skin break down, no evidence of skin tears noted. IV catheter discontinued intact. Site without signs and symptoms of complications - no redness or edema noted at insertion site, patient denies c/o pain - only slight tenderness at site.  Dressing with slight pressure applied.  D/c Instructions-Education: Discharge instructions given to patient/family with  verbalized understanding. D/c education completed with patient/family including follow up instructions, medication list, d/c activities limitations if indicated, with other d/c instructions as indicated by MD - patient able to verbalize understanding, all questions fully answered. Patient instructed to return to ED, call 911, or call MD for any changes in condition.  Patient escorted via [place], and D/C home via private auto.  Atilano Ina, RN 03/05/2019 10:38 AM

## 2019-03-05 NOTE — Discharge Summary (Signed)
Physician Discharge Summary  [name] [id number] DOB: 06-Jun-1940 DOA: 02/25/2019  PCP: Reubin Milan, MD  Admit date: 02/25/2019 Discharge date: 03/05/2019  Admitted From: Home Disposition: Home  Recommendations for Outpatient Follow-up:  1. Follow up with PCP in 1-2 weeks 2. Please obtain BMP/CBC in one week your next doctors visit.  3. Oral prednisone at home for 3 more days   Discharge Condition: Stable CODE STATUS: DNR Diet recommendation: Heart healthy  Brief/Interim Summary:78 year old with history of COPD on 2 L nasal cannula, chronic hypoxia, GERD, atrial fibrillation, CAD status post CABG, hyperlipidemia, history of tobacco use presented to the hospital with shortness of breath.  Initially diagnosed with COPD exacerbation with possible pneumonia.  He was started on steroids, antibiotics and bronchodilators.  Eventually he started feeling better his procalcitonin was negative therefore antibiotics were stopped, his Solu-Medrol was changed to oral prednisone.  Hospital course complicated by dyspnea secondary to fluid overload therefore required IV diuretics.  Echocardiogram showed ejection fraction 60 to 65%.  Over the course of several days his symptoms greatly improved and oxygen was weaned down to his baseline of 2 L nasal cannula.  He was doing well with incentive spirometer.  Physical therapy recommended home health, arrangements were made.  Case was discussed with his son as well.   Discharge Diagnoses:  Principal Problem:   Shortness of breath Active Problems:   Hyperlipidemia   ANEMIA, B12 DEFICIENCY   TOBACCO USE   Essential hypertension   Hx of CABG   Atrial fibrillation (HCC)   GERD (gastroesophageal reflux disease)   COPD (chronic obstructive pulmonary disease) (HCC)   CAD (coronary artery disease)   Chronic respiratory failure with hypoxia (HCC)   Acute on chronic hypoxic respiratory failure, down to 2 L nasal cannula this morning.  2 L baseline at  home. Acute COPD exacerbation -Resolved.  Back to baseline 2 L nasal cannula.  Continue bronchodilators.  Procalcitonin negative, stop antibiotics -Supportive care.  Changed to oral prednisone, 3 more days -COVID-19-negative  Elevated BNP Acute diastolic congestive heart failure, ejection fraction 60 to 65%.  Class I -Appears to be euvolemic. -Echocardiogram 12/5-EF 60 to 65% with dilated cardiomyopathy. -Chest x-ray-pleural effusion and mild opacity.  Syncope, unknown etiology -CT head negative.  Cardiac enzymes negative. -Continue monitor.  Echo reviewed  Chronic diastolic congestive heart failure -Echocardiogram 07/2017-normal ejection fraction.  Paroxysmal atrial fibrillation -Rate control on Cardizem.  Anticoagulation-not on it secondary to fall risk  Diabetes mellitus type 2 -Insulin sliding scale Accu-Chek.  Hemoglobin A1c 6.7 -Advised diet control and keep a log of this at home.  If continues to remain high, he can be started on overall diabetic regimen by his PCP.  History of dementia -On Namenda.  Klonopin as needed  GERD -PPI  Hyperlipidemia -Statin  PT-recommending home health  Consultations:  None  Subjective: Feeling much better, wishes to go home.  Back to 2 L nasal cannula.  No acute events overnight.  Discharge Exam: Vitals:   03/05/19 0805 03/05/19 0831  BP:  112/82  Pulse:  92  Resp:  (!) 24  Temp:  98.1 F (36.7 C)  SpO2: 93% 98%   Vitals:   03/04/19 2344 03/05/19 0402 03/05/19 0805 03/05/19 0831  BP:  125/90  112/82  Pulse:  82  92  Resp:  18  (!) 24  Temp:  (!) 97.5 F (36.4 C)  98.1 F (36.7 C)  TempSrc:  Oral  Oral  SpO2: 93% 95% 93% 98%  Weight:  Height:        General: Pt is alert, awake, not in acute distress, 2 L nasal cannula Cardiovascular: RRR, S1/S2 +, no rubs, no gallops Respiratory: Minimal bibasilar rhonchi Abdominal: Soft, NT, ND, bowel sounds + Extremities: no edema, no cyanosis  Discharge  Instructions  Discharge Instructions    Diet - low sodium heart healthy   Complete by: As directed    Discharge instructions   Complete by: As directed    You were cared for by a hospitalist during your hospital stay. If you have any questions about your discharge medications or the care you received while you were in the hospital after you are discharged, you can call the unit and asked to speak with the hospitalist on call if the hospitalist that took care of you is not available. Once you are discharged, your primary care physician will handle any further medical issues. Please note that NO REFILLS for any discharge medications will be authorized once you are discharged, as it is imperative that you return to your primary care physician (or establish a relationship with a primary care physician if you do not have one) for your aftercare needs so that they can reassess your need for medications and monitor your lab values.  Please request your Prim.MD to go over all Hospital Tests and Procedure/Radiological results at the follow up, please get all Hospital records sent to your Prim MD by signing hospital release before you go home.  Get CBC, CMP, 2 view Chest X ray checked  by Primary MD during your next visit or SNF MD in 5-7 days ( we routinely change or add medications that can affect your baseline labs and fluid status, therefore we recommend that you get the mentioned basic workup next visit with your PCP, your PCP may decide not to get them or add new tests based on their clinical decision)  On your next visit with your primary care physician please Get Medicines reviewed and adjusted.  If you experience worsening of your admission symptoms, develop shortness of breath, life threatening emergency, suicidal or homicidal thoughts you must seek medical attention immediately by calling 911 or calling your MD immediately  if symptoms less severe.  You Must read complete instructions/literature  along with all the possible adverse reactions/side effects for all the Medicines you take and that have been prescribed to you. Take any new Medicines after you have completely understood and accpet all the possible adverse reactions/side effects.   Do not drive, operate heavy machinery, perform activities at heights, swimming or participation in water activities or provide baby sitting services if your were admitted for syncope or siezures until you have seen by Primary MD or a Neurologist and advised to do so again.  Do not drive when taking Pain medications.   Increase activity slowly   Complete by: As directed      Allergies as of 03/05/2019      Reactions   Ticlopidine Hcl Swelling   Lorazepam Other (See Comments)   Altered mental status from 2 doses of IV Ativan on 05/10/14 HALLUCINATIONS AND DELIRIOUS   Other Other (See Comments)   Narcotics: Hallucinations and paranoia   Prednisone Other (See Comments)   NOT TRUE ALLERGY - CONFUSION      Medication List    TAKE these medications   acetaminophen 500 MG tablet Commonly known as: TYLENOL Take 1,000 mg by mouth every 6 (six) hours as needed for headache (pain).   Alogliptin Benzoate 12.5 MG  Tabs Take 12.5 mg by mouth every morning.   arformoterol 15 MCG/2ML Nebu Commonly known as: BROVANA Take 15 mcg by nebulization 2 (two) times daily.   atorvastatin 40 MG tablet Commonly known as: LIPITOR Take 40 mg by mouth at bedtime.   azithromycin 250 MG tablet Commonly known as: ZITHROMAX Take 500 mg by mouth every Monday, Wednesday, and Friday.   benzonatate 100 MG capsule Commonly known as: TESSALON Take 1 capsule (100 mg total) by mouth 3 (three) times daily.   budesonide 0.5 MG/2ML nebulizer solution Commonly known as: PULMICORT Take 0.5 mg by nebulization 2 (two) times daily.   Calcium 600+D 600-400 MG-UNIT tablet Generic drug: Calcium Carbonate-Vitamin D Take 1 tablet by mouth 2 (two) times daily.   clonazePAM 0.5  MG tablet Commonly known as: KLONOPIN Take 0.5 mg by mouth at bedtime.   diltiazem 180 MG 24 hr capsule Commonly known as: CARDIZEM CD Take 180 mg by mouth every morning.   famotidine 40 MG tablet Commonly known as: PEPCID Take 40 mg by mouth at bedtime.   feeding supplement (GLUCERNA SHAKE) Liqd Take 237 mLs by mouth 3 (three) times daily between meals.   ferrous gluconate 324 MG tablet Commonly known as: FERGON Take 324 mg by mouth 2 (two) times daily with a meal.   furosemide 20 MG tablet Commonly known as: LASIX Take 20 mg by mouth every morning.   guaifenesin 400 MG Tabs tablet Commonly known as: HUMIBID E Take 400 mg by mouth 2 (two) times daily.   guaiFENesin-dextromethorphan 100-10 MG/5ML syrup Commonly known as: ROBITUSSIN DM Take 5 mLs by mouth 3 (three) times daily as needed for cough.   ipratropium-albuterol 0.5-2.5 (3) MG/3ML Soln Commonly known as: DUONEB Take 3 mLs by nebulization 4 (four) times daily.   LACTOBACILLUS PO Take 1 capsule by mouth 2 (two) times daily.   Melatonin 3 MG Tabs Take 6 mg by mouth at bedtime.   memantine 10 MG tablet Commonly known as: NAMENDA Take 20 mg by mouth at bedtime.   OXYGEN Inhale 3 L into the lungs continuous.   pantoprazole 40 MG tablet Commonly known as: PROTONIX TAKE 1 TABLET BY MOUTH TWICE A DAY   potassium chloride SA 20 MEQ tablet Commonly known as: KLOR-CON Take 20 mEq by mouth every morning.   predniSONE 20 MG tablet Commonly known as: DELTASONE Take 2 tablets (40 mg total) by mouth daily with breakfast for 3 days.   umeclidinium-vilanterol 62.5-25 MCG/INH Aepb Commonly known as: ANORO ELLIPTA Inhale 1 puff into the lungs daily.   ZyrTEC Allergy 10 MG tablet Generic drug: cetirizine Take 10 mg by mouth every morning.      Follow-up Information    [facility], [facility] Follow up.   Specialty: Home Health Services Why: The home health agency will contact you for the first home  visit. Contact information: [place] [address] [phone]          Allergies  Allergen Reactions  . Ticlopidine Hcl Swelling  . Lorazepam Other (See Comments)    Altered mental status from 2 doses of IV Ativan on 05/10/14 HALLUCINATIONS AND DELIRIOUS  . Other Other (See Comments)    Narcotics: Hallucinations and paranoia  . Prednisone Other (See Comments)    NOT TRUE ALLERGY - CONFUSION    You were cared for by a hospitalist during your hospital stay. If you have any questions about your discharge medications or the care you received while you were  in the hospital after you are discharged, you can call the unit and asked to speak with the hospitalist on call if the hospitalist that took care of you is not available. Once you are discharged, your primary care physician will handle any further medical issues. Please note that no refills for any discharge medications will be authorized once you are discharged, as it is imperative that you return to your primary care physician (or establish a relationship with a primary care physician if you do not have one) for your aftercare needs so that they can reassess your need for medications and monitor your lab values.   Procedures/Studies: Dg Chest 2 View  Result Date: 03/01/2019 CLINICAL DATA:  Shortness of breath. History of COPD. EXAM: CHEST - 2 VIEW COMPARISON:  One-view chest x-ray 02/25/2019. CTA of the chest 08/04/2017 FINDINGS: Heart size is mildly enlarged. Chronic interstitial coarsening is evident. Persistent right lower lobe airspace disease and effusion is noted. There is increasing patchy airspace opacity more superiorly in the right lung is well. The left lung remains clear part from minimal left basilar atelectasis. Lung volumes are low bilaterally. A healed left posterolateral seventh rib fracture is noted. IMPRESSION: 1. Persistent right lower lobe airspace disease and effusion compatible with  pneumonia or aspiration. 2. Increasing patchy airspace disease in the right lung. While this may represent atelectasis, infection is also considered. Electronically Signed   By: San Morelle M.D.   On: 03/01/2019 09:12   Ct Head Wo Contrast  Result Date: 02/25/2019 CLINICAL DATA:  Syncope. EXAM: CT HEAD WITHOUT CONTRAST TECHNIQUE: Contiguous axial images were obtained from the base of the skull through the vertex without intravenous contrast. COMPARISON:  March 15, 2018. FINDINGS: Brain: Mild diffuse cortical atrophy is noted. Mild chronic ischemic white matter disease is noted. No mass effect or midline shift is noted. Ventricular size is within normal limits. There is no evidence of mass lesion, hemorrhage or acute infarction. Vascular: No hyperdense vessel or unexpected calcification. Skull: Normal. Negative for fracture or focal lesion. Sinuses/Orbits: No acute finding. Other: None. IMPRESSION: Mild diffuse cortical atrophy. Mild chronic ischemic white matter disease. No acute intracranial abnormality seen. Electronically Signed   By: Marijo Conception M.D.   On: 02/25/2019 14:25   Dg Chest Portable 1 View  Result Date: 02/25/2019 CLINICAL DATA:  COPD, dyspnea, fall this morning EXAM: PORTABLE CHEST 1 VIEW COMPARISON:  05/11/2018 chest radiograph. FINDINGS: Intact sternotomy wires. CABG clips overlie the mediastinum. Surgical clips in the medial left thoracic inlet. Stable cardiomediastinal silhouette with normal heart size. No pneumothorax. Right costophrenic angle opacity. No left pleural effusion. Hazy right lung base opacity. IMPRESSION: 1. Hazy right lung base opacity, cannot exclude pneumonia. Chest radiograph follow-up advised. 2. Right costophrenic angle opacity suspicious for small right pleural effusion. Electronically Signed   By: Ilona Sorrel M.D.   On: 02/25/2019 10:14      The results of significant diagnostics from this hospitalization (including imaging, microbiology,  ancillary and laboratory) are listed below for reference.     Microbiology: Recent Results (from the past 240 hour(s))  Culture, blood (routine x 2) Call MD if unable to obtain prior to antibiotics being given     Status: None   Collection Time: 02/25/19  3:19 PM   Specimen: BLOOD LEFT FOREARM  Result Value Ref Range Status   Specimen Description BLOOD LEFT FOREARM  Final   Special Requests   Final    BOTTLES DRAWN AEROBIC AND ANAEROBIC Blood Culture results  may not be optimal due to an excessive volume of blood received in culture bottles   Culture   Final    NO GROWTH 5 DAYS Performed at [facility], [place] [address] [place], [place] [address]    Report Status 03/02/2019 FINAL  Final  Culture, blood (routine x 2) Call MD if unable to obtain prior to antibiotics being given     Status: None   Collection Time: 02/25/19  3:27 PM   Specimen: BLOOD LEFT WRIST  Result Value Ref Range Status   Specimen Description BLOOD LEFT WRIST  Final   Special Requests   Final    BOTTLES DRAWN AEROBIC AND ANAEROBIC Blood Culture adequate volume   Culture   Final    NO GROWTH 5 DAYS Performed at [facility] [address] [place], [place] [address]    Report Status 03/02/2019 FINAL  Final  SARS CORONAVIRUS 2 (TAT 6-24 HRS) Nasopharyngeal Nasopharyngeal Swab     Status: None   Collection Time: 02/25/19  9:15 PM   Specimen: Nasopharyngeal Swab  Result Value Ref Range Status   SARS Coronavirus 2 NEGATIVE NEGATIVE Final    Comment: (NOTE) SARS-CoV-2 target nucleic acids are NOT DETECTED. The SARS-CoV-2 RNA is generally detectable in upper and lower respiratory specimens during the acute phase of infection. Negative results do not preclude SARS-CoV-2 infection, do not rule out co-infections with other pathogens, and should not be used as the sole basis for treatment or other patient management decisions. Negative results must be combined with clinical observations, patient history,  and epidemiological information. The expected result is Negative. Fact Sheet for Patients: SugarRoll.be Fact Sheet for Healthcare Providers: https://www.woods-mathews.com/ This test is not yet approved or cleared by the [place] FDA and  has been authorized for detection and/or diagnosis of SARS-CoV-2 by FDA under an Emergency Use Authorization (EUA). This EUA will remain  in effect (meaning this test can be used) for the duration of the COVID-19 declaration under Section 56 4(b)(1) of the Act, 21 U.S.C. section 360bbb-3(b)(1), unless the authorization is terminated or revoked sooner. Performed at [facility], [place] [address], [place], [place] [address]   Culture, sputum-assessment     Status: None   Collection Time: 02/26/19  2:53 PM   Specimen: Expectorated Sputum  Result Value Ref Range Status   Specimen Description EXPECTORATED SPUTUM  Final   Special Requests NONE  Final   Sputum evaluation   Final    THIS SPECIMEN IS ACCEPTABLE FOR SPUTUM CULTURE Performed at [facility], [place] [address], [place], [place] [address]    Report Status 02/26/2019 FINAL  Final  Culture, respiratory     Status: None   Collection Time: 02/26/19  2:53 PM  Result Value Ref Range Status   Specimen Description EXPECTORATED SPUTUM  Final   Special Requests NONE Reflexed from [id number]  Final   Gram Stain   Final    FEW WBC PRESENT, PREDOMINANTLY PMN FEW GRAM POSITIVE COCCI FEW GRAM POSITIVE RODS    Culture   Final    Consistent with normal respiratory flora. Performed at [facility], [place] [address] [place], [place] [address]    Report Status 02/28/2019 FINAL  Final  MRSA PCR Screening     Status: None   Collection Time: 02/27/19 12:32 PM   Specimen: Nasopharyngeal  Result Value Ref Range Status   MRSA by PCR NEGATIVE NEGATIVE Final    Comment:  The GeneXpert MRSA Assay (FDA approved for NASAL specimens only), is one  component of a comprehensive MRSA colonization surveillance program. It is not intended to diagnose MRSA infection nor to guide or monitor treatment for MRSA infections. Performed at [facility], [place] [address] [place], [place] [address]      Labs: BNP (last 3 results) Recent Labs    05/10/18 1547 02/25/19 0954 03/01/19 0248  BNP 195.9* 205.7* [id number]*   Basic Metabolic Panel: Recent Labs  Lab 03/01/19 0248 03/02/19 0246 03/03/19 0301 03/04/19 0406 03/05/19 0343  NA 140 141 143 141 140  K 4.4 4.3 4.1 3.5 4.1  CL 102 101 99 98 97*  CO2 29 30 35* 34* 33*  GLUCOSE 203* 218* 170* 128* 130*  BUN 20 30* 26* 31* 32*  CREATININE 0.99 1.13 1.02 0.84 1.02  CALCIUM 8.7* 8.1* 8.2* 8.5* 8.5*  MG  --  2.4 2.1 2.2 2.1   Liver Function Tests: No results for input(s): AST, ALT, ALKPHOS, BILITOT, PROT, ALBUMIN in the last 168 hours. No results for input(s): LIPASE, AMYLASE in the last 168 hours. No results for input(s): AMMONIA in the last 168 hours. CBC: Recent Labs  Lab 03/01/19 0248 03/02/19 0246 03/03/19 0301 03/04/19 0406 03/05/19 0343  WBC 12.2* 10.9* 11.2* 11.4* 10.5  HGB 12.8* 13.3 13.7 14.3 14.5  HCT 39.5 40.7 42.1 43.7 43.7  MCV 100.5* 100.7* 102.2* 101.2* 99.3  PLT 194 197 209 196 173   Cardiac Enzymes: No results for input(s): CKTOTAL, CKMB, CKMBINDEX, TROPONINI in the last 168 hours. BNP: Invalid input(s): POCBNP CBG: Recent Labs  Lab 03/04/19 0613 03/04/19 1143 03/04/19 1620 03/04/19 2216 03/05/19 0637  GLUCAP 122* 201* 159* 292* 175*   D-Dimer No results for input(s): DDIMER in the last 72 hours. Hgb A1c No results for input(s): HGBA1C in the last 72 hours. Lipid Profile No results for input(s): CHOL, HDL, LDLCALC, TRIG, CHOLHDL, LDLDIRECT in the last 72 hours. Thyroid function studies No results for input(s): TSH, T4TOTAL, T3FREE, THYROIDAB in the last 72 hours.  Invalid input(s): FREET3 Anemia work up No results for input(s):  VITAMINB12, FOLATE, FERRITIN, TIBC, IRON, RETICCTPCT in the last 72 hours. Urinalysis    Component Value Date/Time   COLORURINE YELLOW 02/25/2019 1830   APPEARANCEUR HAZY (A) 02/25/2019 1830   LABSPEC 1.017 02/25/2019 1830   PHURINE 5.0 02/25/2019 1830   GLUCOSEU NEGATIVE 02/25/2019 1830   HGBUR LARGE (A) 02/25/2019 1830   HGBUR negative 02/04/2008 1027   BILIRUBINUR NEGATIVE 02/25/2019 1830   KETONESUR 5 (A) 02/25/2019 1830   PROTEINUR 100 (A) 02/25/2019 1830   UROBILINOGEN 1.0 08/23/2014 1712   NITRITE NEGATIVE 02/25/2019 1830   LEUKOCYTESUR NEGATIVE 02/25/2019 1830   Sepsis Labs Invalid input(s): PROCALCITONIN,  WBC,  LACTICIDVEN Microbiology Recent Results (from the past 240 hour(s))  Culture, blood (routine x 2) Call MD if unable to obtain prior to antibiotics being given     Status: None   Collection Time: 02/25/19  3:19 PM   Specimen: BLOOD LEFT FOREARM  Result Value Ref Range Status   Specimen Description BLOOD LEFT FOREARM  Final   Special Requests   Final    BOTTLES DRAWN AEROBIC AND ANAEROBIC Blood Culture results may not be optimal due to an excessive volume of blood received in culture bottles   Culture   Final    NO GROWTH 5 DAYS Performed at [facility], [place] [address], [place], [place] [address]    Report Status  03/02/2019 FINAL  Final  Culture, blood (routine x 2) Call MD if unable to obtain prior to antibiotics being given     Status: None   Collection Time: 02/25/19  3:27 PM   Specimen: BLOOD LEFT WRIST  Result Value Ref Range Status   Specimen Description BLOOD LEFT WRIST  Final   Special Requests   Final    BOTTLES DRAWN AEROBIC AND ANAEROBIC Blood Culture adequate volume   Culture   Final    NO GROWTH 5 DAYS Performed at [facility], [address], [place], [place] [address]    Report Status 03/02/2019 FINAL  Final  SARS CORONAVIRUS 2 (TAT 6-24 HRS) Nasopharyngeal Nasopharyngeal Swab     Status: None   Collection Time: 02/25/19  9:15  PM   Specimen: Nasopharyngeal Swab  Result Value Ref Range Status   SARS Coronavirus 2 NEGATIVE NEGATIVE Final    Comment: (NOTE) SARS-CoV-2 target nucleic acids are NOT DETECTED. The SARS-CoV-2 RNA is generally detectable in upper and lower respiratory specimens during the acute phase of infection. Negative results do not preclude SARS-CoV-2 infection, do not rule out co-infections with other pathogens, and should not be used as the sole basis for treatment or other patient management decisions. Negative results must be combined with clinical observations, patient history, and epidemiological information. The expected result is Negative. Fact Sheet for Patients: SugarRoll.be Fact Sheet for Healthcare Providers: https://www.woods-mathews.com/ This test is not yet approved or cleared by the [place] FDA and  has been authorized for detection and/or diagnosis of SARS-CoV-2 by FDA under an Emergency Use Authorization (EUA). This EUA will remain  in effect (meaning this test can be used) for the duration of the COVID-19 declaration under Section 56 4(b)(1) of the Act, 21 U.S.C. section 360bbb-3(b)(1), unless the authorization is terminated or revoked sooner. Performed at [facility], [place] [address] [place], [place] [address]   Culture, sputum-assessment     Status: None   Collection Time: 02/26/19  2:53 PM   Specimen: Expectorated Sputum  Result Value Ref Range Status   Specimen Description EXPECTORATED SPUTUM  Final   Special Requests NONE  Final   Sputum evaluation   Final    THIS SPECIMEN IS ACCEPTABLE FOR SPUTUM CULTURE Performed at [facility], [place] [address], [place], [place] [address]    Report Status 02/26/2019 FINAL  Final  Culture, respiratory     Status: None   Collection Time: 02/26/19  2:53 PM  Result Value Ref Range Status   Specimen Description EXPECTORATED SPUTUM  Final   Special Requests NONE Reflexed  from [id number]  Final   Gram Stain   Final    FEW WBC PRESENT, PREDOMINANTLY PMN FEW GRAM POSITIVE COCCI FEW GRAM POSITIVE RODS    Culture   Final    Consistent with normal respiratory flora. Performed at [facility], [place] [address] [place], [place] [address]    Report Status 02/28/2019 FINAL  Final  MRSA PCR Screening     Status: None   Collection Time: 02/27/19 12:32 PM   Specimen: Nasopharyngeal  Result Value Ref Range Status   MRSA by PCR NEGATIVE NEGATIVE Final    Comment:        The GeneXpert MRSA Assay (FDA approved for NASAL specimens only), is one component of a comprehensive MRSA colonization surveillance program. It is not intended to diagnose MRSA infection nor to guide or monitor treatment for MRSA infections. Performed at [facility], [place] [address], [place], [place] [address]      Time coordinating discharge:  I have spent 35 minutes face to face with the patient and on the ward discussing the patients care, assessment, plan and disposition with other care givers. >50% of the time was devoted counseling the patient about the risks and benefits of treatment/Discharge disposition and coordinating care.   SIGNED:   Damita Lack, MD  [facility] 03/05/2019, 11:10 AM   If 7PM-7AM, please contact night-coverage

## 2019-03-14 ENCOUNTER — Other Ambulatory Visit: Payer: Self-pay

## 2019-03-14 ENCOUNTER — Encounter (HOSPITAL_COMMUNITY): Payer: Self-pay

## 2019-03-14 ENCOUNTER — Emergency Department (HOSPITAL_COMMUNITY): Payer: Medicare Other

## 2019-03-14 ENCOUNTER — Inpatient Hospital Stay (HOSPITAL_COMMUNITY)
Admission: EM | Admit: 2019-03-14 | Discharge: 2019-03-29 | DRG: 177 | Disposition: E | Payer: Medicare Other | Attending: Internal Medicine | Admitting: Internal Medicine

## 2019-03-14 DIAGNOSIS — Z87891 Personal history of nicotine dependence: Secondary | ICD-10-CM

## 2019-03-14 DIAGNOSIS — J440 Chronic obstructive pulmonary disease with acute lower respiratory infection: Secondary | ICD-10-CM | POA: Diagnosis present

## 2019-03-14 DIAGNOSIS — Z8261 Family history of arthritis: Secondary | ICD-10-CM

## 2019-03-14 DIAGNOSIS — F419 Anxiety disorder, unspecified: Secondary | ICD-10-CM | POA: Diagnosis present

## 2019-03-14 DIAGNOSIS — F0280 Dementia in other diseases classified elsewhere without behavioral disturbance: Secondary | ICD-10-CM | POA: Diagnosis not present

## 2019-03-14 DIAGNOSIS — Z66 Do not resuscitate: Secondary | ICD-10-CM | POA: Diagnosis present

## 2019-03-14 DIAGNOSIS — I2510 Atherosclerotic heart disease of native coronary artery without angina pectoris: Secondary | ICD-10-CM | POA: Diagnosis present

## 2019-03-14 DIAGNOSIS — J1289 Other viral pneumonia: Secondary | ICD-10-CM | POA: Diagnosis present

## 2019-03-14 DIAGNOSIS — Z951 Presence of aortocoronary bypass graft: Secondary | ICD-10-CM

## 2019-03-14 DIAGNOSIS — Z515 Encounter for palliative care: Secondary | ICD-10-CM | POA: Diagnosis not present

## 2019-03-14 DIAGNOSIS — I480 Paroxysmal atrial fibrillation: Secondary | ICD-10-CM | POA: Diagnosis present

## 2019-03-14 DIAGNOSIS — N179 Acute kidney failure, unspecified: Secondary | ICD-10-CM | POA: Diagnosis present

## 2019-03-14 DIAGNOSIS — E118 Type 2 diabetes mellitus with unspecified complications: Secondary | ICD-10-CM | POA: Diagnosis not present

## 2019-03-14 DIAGNOSIS — F0281 Dementia in other diseases classified elsewhere with behavioral disturbance: Secondary | ICD-10-CM | POA: Diagnosis present

## 2019-03-14 DIAGNOSIS — I4891 Unspecified atrial fibrillation: Secondary | ICD-10-CM | POA: Diagnosis not present

## 2019-03-14 DIAGNOSIS — E785 Hyperlipidemia, unspecified: Secondary | ICD-10-CM | POA: Diagnosis present

## 2019-03-14 DIAGNOSIS — Z781 Physical restraint status: Secondary | ICD-10-CM

## 2019-03-14 DIAGNOSIS — J9691 Respiratory failure, unspecified with hypoxia: Secondary | ICD-10-CM | POA: Diagnosis not present

## 2019-03-14 DIAGNOSIS — I252 Old myocardial infarction: Secondary | ICD-10-CM | POA: Diagnosis not present

## 2019-03-14 DIAGNOSIS — E1151 Type 2 diabetes mellitus with diabetic peripheral angiopathy without gangrene: Secondary | ICD-10-CM | POA: Diagnosis present

## 2019-03-14 DIAGNOSIS — Z7951 Long term (current) use of inhaled steroids: Secondary | ICD-10-CM

## 2019-03-14 DIAGNOSIS — M810 Age-related osteoporosis without current pathological fracture: Secondary | ICD-10-CM | POA: Diagnosis present

## 2019-03-14 DIAGNOSIS — U071 COVID-19: Principal | ICD-10-CM | POA: Diagnosis present

## 2019-03-14 DIAGNOSIS — J9690 Respiratory failure, unspecified, unspecified whether with hypoxia or hypercapnia: Secondary | ICD-10-CM | POA: Diagnosis present

## 2019-03-14 DIAGNOSIS — R0600 Dyspnea, unspecified: Secondary | ICD-10-CM | POA: Diagnosis not present

## 2019-03-14 DIAGNOSIS — G309 Alzheimer's disease, unspecified: Secondary | ICD-10-CM | POA: Diagnosis not present

## 2019-03-14 DIAGNOSIS — Z9981 Dependence on supplemental oxygen: Secondary | ICD-10-CM

## 2019-03-14 DIAGNOSIS — R0902 Hypoxemia: Secondary | ICD-10-CM

## 2019-03-14 DIAGNOSIS — J441 Chronic obstructive pulmonary disease with (acute) exacerbation: Secondary | ICD-10-CM | POA: Diagnosis present

## 2019-03-14 DIAGNOSIS — F0391 Unspecified dementia with behavioral disturbance: Secondary | ICD-10-CM | POA: Diagnosis present

## 2019-03-14 DIAGNOSIS — I272 Pulmonary hypertension, unspecified: Secondary | ICD-10-CM | POA: Diagnosis present

## 2019-03-14 DIAGNOSIS — Z888 Allergy status to other drugs, medicaments and biological substances status: Secondary | ICD-10-CM

## 2019-03-14 DIAGNOSIS — I5032 Chronic diastolic (congestive) heart failure: Secondary | ICD-10-CM | POA: Diagnosis present

## 2019-03-14 DIAGNOSIS — Z7189 Other specified counseling: Secondary | ICD-10-CM | POA: Diagnosis not present

## 2019-03-14 DIAGNOSIS — F03918 Unspecified dementia, unspecified severity, with other behavioral disturbance: Secondary | ICD-10-CM | POA: Diagnosis present

## 2019-03-14 DIAGNOSIS — F05 Delirium due to known physiological condition: Secondary | ICD-10-CM | POA: Diagnosis not present

## 2019-03-14 DIAGNOSIS — K449 Diaphragmatic hernia without obstruction or gangrene: Secondary | ICD-10-CM | POA: Diagnosis present

## 2019-03-14 DIAGNOSIS — Z7984 Long term (current) use of oral hypoglycemic drugs: Secondary | ICD-10-CM

## 2019-03-14 DIAGNOSIS — K219 Gastro-esophageal reflux disease without esophagitis: Secondary | ICD-10-CM | POA: Diagnosis present

## 2019-03-14 DIAGNOSIS — I110 Hypertensive heart disease with heart failure: Secondary | ICD-10-CM | POA: Diagnosis present

## 2019-03-14 DIAGNOSIS — R0602 Shortness of breath: Secondary | ICD-10-CM | POA: Diagnosis present

## 2019-03-14 DIAGNOSIS — J189 Pneumonia, unspecified organism: Secondary | ICD-10-CM

## 2019-03-14 DIAGNOSIS — J9621 Acute and chronic respiratory failure with hypoxia: Secondary | ICD-10-CM | POA: Diagnosis present

## 2019-03-14 DIAGNOSIS — Z8249 Family history of ischemic heart disease and other diseases of the circulatory system: Secondary | ICD-10-CM

## 2019-03-14 DIAGNOSIS — J1282 Pneumonia due to coronavirus disease 2019: Secondary | ICD-10-CM | POA: Diagnosis present

## 2019-03-14 DIAGNOSIS — E119 Type 2 diabetes mellitus without complications: Secondary | ICD-10-CM

## 2019-03-14 DIAGNOSIS — Z800 Family history of malignant neoplasm of digestive organs: Secondary | ICD-10-CM

## 2019-03-14 LAB — FIBRINOGEN: Fibrinogen: 787 mg/dL — ABNORMAL HIGH (ref 210–475)

## 2019-03-14 LAB — COMPREHENSIVE METABOLIC PANEL
ALT: 24 U/L (ref 0–44)
AST: 30 U/L (ref 15–41)
Albumin: 2.7 g/dL — ABNORMAL LOW (ref 3.5–5.0)
Alkaline Phosphatase: 43 U/L (ref 38–126)
Anion gap: 14 (ref 5–15)
BUN: 20 mg/dL (ref 8–23)
CO2: 25 mmol/L (ref 22–32)
Calcium: 8.4 mg/dL — ABNORMAL LOW (ref 8.9–10.3)
Chloride: 101 mmol/L (ref 98–111)
Creatinine, Ser: 1.42 mg/dL — ABNORMAL HIGH (ref 0.61–1.24)
GFR calc Af Amer: 54 mL/min — ABNORMAL LOW (ref >60)
GFR calc non Af Amer: 47 mL/min — ABNORMAL LOW (ref >60)
Glucose, Bld: 150 mg/dL — ABNORMAL HIGH (ref 70–99)
Potassium: 4.5 mmol/L (ref 3.5–5.1)
Sodium: 140 mmol/L (ref 135–145)
Total Bilirubin: 1.2 mg/dL (ref 0.3–1.2)
Total Protein: 6.2 g/dL — ABNORMAL LOW (ref 6.5–8.1)

## 2019-03-14 LAB — POCT I-STAT 7, (LYTES, BLD GAS, ICA,H+H)
Acid-Base Excess: 2 mmol/L (ref 0.0–2.0)
Bicarbonate: 26 mmol/L (ref 20.0–28.0)
Calcium, Ion: 1.14 mmol/L — ABNORMAL LOW (ref 1.15–1.40)
HCT: 40 % (ref 39.0–52.0)
Hemoglobin: 13.6 g/dL (ref 13.0–17.0)
O2 Saturation: 99 %
Patient temperature: 97.5
Potassium: 4.5 mmol/L (ref 3.5–5.1)
Sodium: 138 mmol/L (ref 135–145)
TCO2: 27 mmol/L (ref 22–32)
pCO2 arterial: 38.4 mmHg (ref 32.0–48.0)
pH, Arterial: 7.437 (ref 7.350–7.450)
pO2, Arterial: 147 mmHg — ABNORMAL HIGH (ref 83.0–108.0)

## 2019-03-14 LAB — CBC WITH DIFFERENTIAL/PLATELET
Basophils Absolute: 0 10*3/uL (ref 0.0–0.1)
Basophils Relative: 0 %
Eosinophils Absolute: 0 10*3/uL (ref 0.0–0.5)
Eosinophils Relative: 0 %
HCT: 45.2 % (ref 39.0–52.0)
Hemoglobin: 14.8 g/dL (ref 13.0–17.0)
Lymphocytes Relative: 4 %
Lymphs Abs: 0.2 10*3/uL — ABNORMAL LOW (ref 0.7–4.0)
MCH: 33.3 pg (ref 26.0–34.0)
MCHC: 32.7 g/dL (ref 30.0–36.0)
MCV: 101.6 fL — ABNORMAL HIGH (ref 80.0–100.0)
Monocytes Absolute: 0.2 10*3/uL (ref 0.1–1.0)
Monocytes Relative: 3 %
Neutro Abs: 6.1 10*3/uL (ref 1.7–7.7)
Neutrophils Relative %: 93 %
Platelets: 90 10*3/uL — ABNORMAL LOW (ref 150–400)
RBC: 4.45 MIL/uL (ref 4.22–5.81)
RDW: 14.6 % (ref 11.5–15.5)
WBC: 6.6 10*3/uL (ref 4.0–10.5)
nRBC: 0 % (ref 0.0–0.2)

## 2019-03-14 LAB — FERRITIN: Ferritin: 1903 ng/mL — ABNORMAL HIGH (ref 24–336)

## 2019-03-14 LAB — POC SARS CORONAVIRUS 2 AG -  ED: SARS Coronavirus 2 Ag: POSITIVE — AB

## 2019-03-14 LAB — C-REACTIVE PROTEIN: CRP: 17.3 mg/dL — ABNORMAL HIGH (ref <1.0)

## 2019-03-14 LAB — BRAIN NATRIURETIC PEPTIDE: B Natriuretic Peptide: 82.6 pg/mL (ref 0.0–100.0)

## 2019-03-14 LAB — PROCALCITONIN: Procalcitonin: 0.39 ng/mL

## 2019-03-14 LAB — LACTATE DEHYDROGENASE: LDH: 291 U/L — ABNORMAL HIGH (ref 98–192)

## 2019-03-14 LAB — TROPONIN I (HIGH SENSITIVITY)
Troponin I (High Sensitivity): 23 ng/L — ABNORMAL HIGH (ref <18)
Troponin I (High Sensitivity): 19 ng/L — ABNORMAL HIGH (ref <18)

## 2019-03-14 LAB — LACTIC ACID, PLASMA
Lactic Acid, Venous: 1.5 mmol/L (ref 0.5–1.9)
Lactic Acid, Venous: 1.6 mmol/L (ref 0.5–1.9)

## 2019-03-14 LAB — TRIGLYCERIDES: Triglycerides: 96 mg/dL (ref <150)

## 2019-03-14 LAB — STREP PNEUMONIAE URINARY ANTIGEN: Strep Pneumo Urinary Antigen: NEGATIVE

## 2019-03-14 LAB — D-DIMER, QUANTITATIVE: D-Dimer, Quant: 2.23 ug/mL-FEU — ABNORMAL HIGH (ref 0.00–0.50)

## 2019-03-14 MED ORDER — MOMETASONE FURO-FORMOTEROL FUM 200-5 MCG/ACT IN AERO
1.0000 | INHALATION_SPRAY | Freq: Two times a day (BID) | RESPIRATORY_TRACT | Status: DC
  Administered 2019-03-15 – 2019-03-18 (×7): 1 via RESPIRATORY_TRACT
  Filled 2019-03-14: qty 8.8

## 2019-03-14 MED ORDER — SODIUM CHLORIDE 0.9 % IV SOLN
2.0000 g | Freq: Two times a day (BID) | INTRAVENOUS | Status: DC
  Administered 2019-03-15 – 2019-03-18 (×7): 2 g via INTRAVENOUS
  Filled 2019-03-14 (×7): qty 2

## 2019-03-14 MED ORDER — PANTOPRAZOLE SODIUM 40 MG PO TBEC
40.0000 mg | DELAYED_RELEASE_TABLET | Freq: Two times a day (BID) | ORAL | Status: DC
  Administered 2019-03-15 – 2019-03-17 (×6): 40 mg via ORAL
  Filled 2019-03-14 (×6): qty 1

## 2019-03-14 MED ORDER — UMECLIDINIUM-VILANTEROL 62.5-25 MCG/INH IN AEPB
1.0000 | INHALATION_SPRAY | Freq: Every day | RESPIRATORY_TRACT | Status: DC
  Filled 2019-03-14: qty 14

## 2019-03-14 MED ORDER — ACETAMINOPHEN 650 MG RE SUPP: 650.0000 mg | Freq: Four times a day (QID) | RECTAL | Status: DC | PRN

## 2019-03-14 MED ORDER — MEMANTINE HCL 10 MG PO TABS
20.0000 mg | ORAL_TABLET | Freq: Every day | ORAL | Status: DC
  Administered 2019-03-15 – 2019-03-17 (×3): 20 mg via ORAL
  Filled 2019-03-14 (×4): qty 2

## 2019-03-14 MED ORDER — VANCOMYCIN HCL 750 MG/150ML IV SOLN: 750.0000 mg | INTRAVENOUS | Status: DC

## 2019-03-14 MED ORDER — METHYLPREDNISOLONE SODIUM SUCC 125 MG IJ SOLR
60.0000 mg | Freq: Three times a day (TID) | INTRAMUSCULAR | Status: AC
  Administered 2019-03-14 – 2019-03-16 (×7): 60 mg via INTRAVENOUS
  Filled 2019-03-14 (×7): qty 2

## 2019-03-14 MED ORDER — ATORVASTATIN CALCIUM 40 MG PO TABS
40.0000 mg | ORAL_TABLET | Freq: Every day | ORAL | Status: DC
  Administered 2019-03-15 – 2019-03-17 (×3): 40 mg via ORAL
  Filled 2019-03-14 (×2): qty 1

## 2019-03-14 MED ORDER — SODIUM CHLORIDE 0.9 % IV SOLN
2.0000 g | Freq: Once | INTRAVENOUS | Status: AC
  Administered 2019-03-14: 2 g via INTRAVENOUS
  Filled 2019-03-14: qty 2

## 2019-03-14 MED ORDER — SENNOSIDES-DOCUSATE SODIUM 8.6-50 MG PO TABS: 1.0000 | ORAL_TABLET | Freq: Every evening | ORAL | Status: DC | PRN

## 2019-03-14 MED ORDER — GUAIFENESIN-DM 100-10 MG/5ML PO SYRP
5.0000 mL | ORAL_SOLUTION | Freq: Three times a day (TID) | ORAL | Status: DC | PRN
  Administered 2019-03-15: 21:00:00 5 mL via ORAL
  Filled 2019-03-14: qty 5

## 2019-03-14 MED ORDER — ONDANSETRON HCL 4 MG PO TABS: 4.0000 mg | ORAL_TABLET | Freq: Four times a day (QID) | ORAL | Status: DC | PRN

## 2019-03-14 MED ORDER — ACETAMINOPHEN 325 MG PO TABS: 650.0000 mg | ORAL_TABLET | Freq: Four times a day (QID) | ORAL | Status: DC | PRN

## 2019-03-14 MED ORDER — FUROSEMIDE 20 MG PO TABS
20.0000 mg | ORAL_TABLET | Freq: Every morning | ORAL | Status: DC
  Administered 2019-03-15 – 2019-03-17 (×3): 20 mg via ORAL
  Filled 2019-03-14 (×3): qty 1

## 2019-03-14 MED ORDER — DILTIAZEM HCL ER COATED BEADS 180 MG PO CP24
180.0000 mg | ORAL_CAPSULE | Freq: Every morning | ORAL | Status: DC
  Administered 2019-03-15 – 2019-03-17 (×3): 180 mg via ORAL
  Filled 2019-03-14 (×5): qty 1

## 2019-03-14 MED ORDER — FAMOTIDINE 20 MG PO TABS
40.0000 mg | ORAL_TABLET | Freq: Every day | ORAL | Status: DC
  Administered 2019-03-15 – 2019-03-17 (×3): 40 mg via ORAL
  Filled 2019-03-14 (×3): qty 2

## 2019-03-14 MED ORDER — ONDANSETRON HCL 4 MG/2ML IJ SOLN: 4.0000 mg | Freq: Four times a day (QID) | INTRAMUSCULAR | Status: DC | PRN

## 2019-03-14 MED ORDER — VANCOMYCIN HCL 1250 MG/250ML IV SOLN
1250.0000 mg | Freq: Once | INTRAVENOUS | Status: AC
  Administered 2019-03-14: 20:00:00 1250 mg via INTRAVENOUS
  Filled 2019-03-14: qty 250

## 2019-03-14 MED ORDER — ENOXAPARIN SODIUM 40 MG/0.4ML ~~LOC~~ SOLN
40.0000 mg | SUBCUTANEOUS | Status: DC
  Administered 2019-03-14 – 2019-03-15 (×2): 40 mg via SUBCUTANEOUS
  Filled 2019-03-14 (×2): qty 0.4

## 2019-03-14 NOTE — ED Notes (Signed)
Spoke with son holston gartman. Update given on pt condition

## 2019-03-14 NOTE — H&P (Signed)
History and Physical    [name] [id number] DOB: [birth year] DOA: 03/22/2019  PCP: Reubin Milan, MD   Patient coming from: Home  I have personally briefly reviewed patient's old medical records in [place]  Chief Complaint: Shortness of breath  HPI: [name] is a 78 y.o. male with medical history significant of chronic hypoxic respiratory failure on 2 to 4 L of oxygen at home, COPD, hypertension, coronary disease status post CABG, hyperlipidemia, paroxysmal A. fib, recent admission and discharge around 2 weeks ago for COPD exacerbation/pneumonia presented with worsening shortness of breath.  Patient is a poor historian.  States that he has been more short of breath for a week.  He states that he went to a party more than a week ago and few days later become more short of breath with chest tightness, nonproductive cough, increased work of breathing.  Denies any contact with any Covid patients.  No fever, nausea or vomiting reported no abdominal pain, diarrhea, dysuria, leg swelling, loss of consciousness, seizures.  ED Course: Patient was placed on a nonrebreather per EMS and he received Solu-Medrol and magnesium.  In the ED, chest x-ray showed possibility of pneumonia.  He was started on intravenous antibiotics.  Hospitalist service was called to evaluate the patient.  Review of Systems: Could not be reviewed appropriately because of patient being a poor historian.   Past Medical History:  Diagnosis Date  . Acute kidney injury ([place]) 05/10/2018  . Alcohol abuse   . Allergic rhinitis   . ANEMIA, B12 DEFICIENCY 12/02/2008  . Arthritis   . CAD (coronary artery disease)    a.  s/p CABG 2011.  Marland Kitchen CAP (community acquired pneumonia) 04/03/2014  . CAROTID ARTERY STENOSIS 04/16/2009  . Cataract   . Chronic diastolic CHF (congestive heart failure) ([place])   . Chronic respiratory failure ([place])   . COLONIC POLYPS 05/21/2007  . Compression fracture of L1 lumbar vertebra ([facility])   . COPD  with acute exacerbation ([place]) 06/10/2016  . COPD, severe ([place])   . Diabetes mellitus without complication ([place])   . DIVERTICULAR DISEASE 05/21/2007  . Dysrhythmia   . Essential hypertension   . Falls   . GERD (gastroesophageal reflux disease)   . GI BLEEDING 09/09/2008  . HCAP (healthcare-associated pneumonia)   . Hemoptysis   . HIATAL HERNIA 05/21/2007  . History of kidney stones   . HYPERLIPIDEMIA 11/21/2006  . Hypertension 08/23/2014  . Internal hemorrhoids   . Low back pain 08/23/2014  . MYOCARDIAL INFARCTION, HX OF 11/21/2006  . NEPHROLITHIASIS 05/21/2007  . Normocytic anemia   . NSVT (nonsustained ventricular tachycardia) ([place])    a. Holter 2015: NSR, A. Fib/flutter, short runs of NSVT  . Olecranon bursitis 07/09/2014   Required surgery and 6 day hospitalization. Some chronic pain at L elbow.    . OSTEOPOROSIS 11/21/2006  . Osteoporosis   . Paroxysmal atrial fibrillation ([place])    a. Anticoag stopped due to falls, rectal bleeding.  . Paroxysmal atrial flutter ([place])   . Pneumonia   . Pulmonary nodule    a. Dx 01/2015 -> advised to f/u PCP for PET.  . Pulmonary nodules    No pulmonary nodules on CTA Chest 07/2017  . PVD (peripheral vascular disease) ([place])    a. s/p prior iliac stenting.  . Syncope 08/23/2014    Past Surgical History:  Procedure Laterality Date  . APPENDECTOMY    . CARDIOVERSION N/A 04/16/2014   Procedure: CARDIOVERSION;  Surgeon: Wallis Bamberg  Johnsie Cancel, MD;  Location: [facility];  Service: Cardiovascular;  Laterality: N/A;  . CHOLECYSTECTOMY    . CORONARY ARTERY BYPASS GRAFT  2011  . CORONARY ARTERY BYPASS GRAFT     x 5  . CORONARY STENT PLACEMENT     03/07/2001  . HEMORRHOID SURGERY    . INGUINAL HERNIA REPAIR  10/10/08  . MASS EXCISION Left 07/09/2014   Procedure: LEFT ELBOW BURSECTOMY EXCISION MASS;  Surgeon: Iran Planas, MD;  Location: [facility];  Service: Orthopedics;  Laterality: Left;  . OLECRANON BURSECTOMY  07/09/2014  . PTCA    . THYROIDECTOMY     partial      reports that he quit smoking about 9 years ago. His smoking use included cigarettes. He has a 150.00 pack-year smoking history. He has never used smokeless tobacco. He reports current alcohol use of about 6.0 standard drinks of alcohol per week. He reports that he does not use drugs.  Allergies  Allergen Reactions  . Ticlopidine Hcl Swelling  . Lorazepam Other (See Comments)    Altered mental status from 2 doses of IV Ativan on 05/10/14 HALLUCINATIONS AND DELIRIOUS  . Other Other (See Comments)    Narcotics: Hallucinations and paranoia  . Prednisone Other (See Comments)    NOT TRUE ALLERGY - CONFUSION    Family History  Problem Relation Age of Onset  . Arthritis Mother        rheumatoid  . Heart attack Father   . Heart disease Father   . Heart disease Brother   . Colon cancer Brother 31  . Heart disease Brother   . Stomach cancer Neg Hx     Prior to Admission medications   Medication Sig Start Date End Date Taking? Authorizing Provider  acetaminophen (TYLENOL) 500 MG tablet Take 1,000 mg by mouth every 6 (six) hours as needed for headache (pain).    [provider]  Alogliptin Benzoate 12.5 MG TABS Take 12.5 mg by mouth every morning.    [provider]  arformoterol (BROVANA) 15 MCG/2ML NEBU Take 15 mcg by nebulization 2 (two) times daily.    [provider]  atorvastatin (LIPITOR) 40 MG tablet Take 40 mg by mouth at bedtime.     [provider]  azithromycin (ZITHROMAX) 250 MG tablet Take 500 mg by mouth every Monday, Wednesday, and Friday.    [provider]  benzonatate (TESSALON) 100 MG capsule Take 1 capsule (100 mg total) by mouth 3 (three) times daily. Patient not taking: Reported on 05/10/2018 03/19/18   Lavina Hamman, MD  budesonide (PULMICORT) 0.5 MG/2ML nebulizer solution Take 0.5 mg by nebulization 2 (two) times daily.    [provider]  Calcium Carbonate-Vitamin D (CALCIUM 600+D) 600-400 MG-UNIT tablet  Take 1 tablet by mouth 2 (two) times daily.    [provider]  cetirizine (ZYRTEC ALLERGY) 10 MG tablet Take 10 mg by mouth every morning.  08/17/10   Parrett, Fonnie Mu, NP  clonazePAM (KLONOPIN) 0.5 MG tablet Take 0.5 mg by mouth at bedtime.    [provider]  diltiazem (CARDIZEM CD) 180 MG 24 hr capsule Take 180 mg by mouth every morning.  01/21/16   Sherren Mocha, MD  famotidine (PEPCID) 40 MG tablet Take 40 mg by mouth at bedtime.    [provider]  feeding supplement, GLUCERNA SHAKE, (GLUCERNA SHAKE) LIQD Take 237 mLs by mouth 3 (three) times daily between meals. Patient not taking: Reported on 05/10/2018 06/14/16   Molt, [place], DO  ferrous gluconate (FERGON) 324 MG tablet Take 324 mg by mouth 2 (two) times daily with a meal.    [provider]  furosemide (LASIX) 20 MG tablet Take 20 mg by mouth every morning.     [provider]  guaifenesin (HUMIBID E) 400 MG TABS tablet Take 400 mg by mouth 2 (two) times daily.    [provider]  guaiFENesin-dextromethorphan (ROBITUSSIN DM) 100-10 MG/5ML syrup Take 5 mLs by mouth 3 (three) times daily as needed for cough.    [provider]  ipratropium-albuterol (DUONEB) 0.5-2.5 (3) MG/3ML SOLN Take 3 mLs by nebulization 4 (four) times daily. Patient not taking: Reported on 05/10/2018 03/19/18   Lavina Hamman, MD  LACTOBACILLUS PO Take 1 capsule by mouth 2 (two) times daily.     [provider]  Melatonin 3 MG TABS Take 6 mg by mouth at bedtime.    [provider]  memantine (NAMENDA) 10 MG tablet Take 20 mg by mouth at bedtime.     [provider]  OXYGEN Inhale 3 L into the lungs continuous.     [provider]  pantoprazole (PROTONIX) 40 MG tablet TAKE 1 TABLET BY MOUTH TWICE A DAY Patient taking differently: Take 40 mg by mouth 2 (two) times daily.  05/19/14   Marin Olp, MD  potassium chloride SA (K-DUR,KLOR-CON) 20 MEQ tablet Take 20 mEq by  mouth every morning.     [provider]  umeclidinium-vilanterol (ANORO ELLIPTA) 62.5-25 MCG/INH AEPB Inhale 1 puff into the lungs daily. Patient not taking: Reported on 02/25/2019 05/15/18   Bonnita Hollow, MD    Physical Exam: Vitals:   03/12/2019 1536  BP: 123/70  Pulse: 86  Resp: 20  Temp: (!) 97.5 F (36.4 C)  SpO2: 98%    Constitutional: Looks to be in moderate distress secondary to dyspnea.  Looks chronically ill.  Elderly male lying in bed.  Currently on nonrebreather and unable to complete full sentences.  Poor historian. Vitals:   03/02/2019 1536  BP: 123/70  Pulse: 86  Resp: 20  Temp: (!) 97.5 F (36.4 C)  SpO2: 98%   Eyes: PERRL, lids and conjunctivae normal ENMT: Mucous membranes are moist. Posterior pharynx clear of any exudate or lesions. Neck: normal, supple, no masses, no thyromegaly Respiratory: bilateral decreased breath sounds at bases with scattered crackles, very minimal wheezing.  Tachypneic.  No accessory muscle use.  Cardiovascular: S1 S2 positive, rate controlled. No extremity edema. 2+ pedal pulses.  Abdomen: no tenderness, no masses palpated. No hepatosplenomegaly. Bowel sounds positive.  Musculoskeletal: no clubbing / cyanosis. No joint deformity upper and lower extremities.  Skin: no rashes, lesions, ulcers. No induration Neurologic: CN 2-12 grossly intact. Moving extremities. No focal neurologic deficits.  Psychiatric: Looks anxious.   Labs on Admission: I have personally reviewed following labs and imaging studies  CBC: Recent Labs  Lab 03/22/2019 1620  WBC 6.6  NEUTROABS 6.1  HGB 14.8  HCT 45.2  MCV 101.6*  PLT 90*   Basic Metabolic Panel: Recent Labs  Lab 03/02/2019 1620  NA 140  K 4.5  CL 101  CO2 25  GLUCOSE 150*  BUN 20  CREATININE 1.42*  CALCIUM 8.4*   GFR: CrCl cannot be calculated (Unknown ideal weight.). Liver Function Tests: Recent Labs  Lab 03/24/2019 1620  AST 30  ALT 24  ALKPHOS 43  BILITOT 1.2    PROT 6.2*  ALBUMIN 2.7*   No results for input(s): LIPASE, AMYLASE in  the last 168 hours. No results for input(s): AMMONIA in the last 168 hours. Coagulation Profile: No results for input(s): INR, PROTIME in the last 168 hours. Cardiac Enzymes: No results for input(s): CKTOTAL, CKMB, CKMBINDEX, TROPONINI in the last 168 hours. BNP (last 3 results) No results for input(s): PROBNP in the last 8760 hours. HbA1C: No results for input(s): HGBA1C in the last 72 hours. CBG: No results for input(s): GLUCAP in the last 168 hours. Lipid Profile: Recent Labs    03/17/2019 1620  TRIG 96   Thyroid Function Tests: No results for input(s): TSH, T4TOTAL, FREET4, T3FREE, THYROIDAB in the last 72 hours. Anemia Panel: Recent Labs    03/13/2019 1620  FERRITIN 1,903*   Urine analysis:    Component Value Date/Time   COLORURINE YELLOW 02/25/2019 1830   APPEARANCEUR HAZY (A) 02/25/2019 1830   LABSPEC 1.017 02/25/2019 1830   PHURINE 5.0 02/25/2019 1830   GLUCOSEU NEGATIVE 02/25/2019 1830   HGBUR LARGE (A) 02/25/2019 1830   HGBUR negative 02/04/2008 [place] 02/25/2019 1830   KETONESUR 5 (A) 02/25/2019 1830   PROTEINUR 100 (A) 02/25/2019 1830   UROBILINOGEN 1.0 08/23/2014 1712   NITRITE NEGATIVE 02/25/2019 1830   LEUKOCYTESUR NEGATIVE 02/25/2019 1830    Radiological Exams on Admission: DG Chest Port 1 View  Result Date: 02/27/2019 CLINICAL DATA:  Shortness of breath. Additional provided: Shortness of breath, patient denies fever or cough. History of dementia. EXAM: PORTABLE CHEST 1 VIEW COMPARISON:  Chest radiograph 07/16/2018 FINDINGS: Mild cardiomegaly, unchanged. Sequela of prior median sternotomy/CABG. Airspace opacity at the right lung base has increased since prior examination 03/01/2019. New subtle pulmonary opacities also questioned within the left midlung. No sizable pleural effusion or evidence of pneumothorax. No acute bony abnormality. IMPRESSION: Interval  increase in right basilar airspace opacity since prior exam 03/01/2019. New subtle pulmonary opacities also questioned within the left mid lung. Findings are nonspecific but most suspicious for pneumonia. Clinical correlation and radiographic follow-up to resolution recommended. Cardiomegaly status post CABG. Electronically Signed   By: Kellie Simmering DO   On: 03/08/2019 16:05   EKG: Reviewed by myself.  No ST elevations or depressions.  Assessment/Plan  Acute on chronic hypoxic respiratory failure Possible healthcare associated pneumonia with concern for gram-negative pneumonia versus MRSA pneumonia Possible COPD exacerbation -Patient uses 2 to 4 L oxygen at home.  Currently on nonrebreather.  Wean off as able. -Chest x-ray shows new subtle pulmonary opacities within the left mid lung.  Cefepime and vancomycin IV started.  Continue antibiotics.  Follow cultures.  Urine Legionella and streptococcal antigen. -COVID-19 testing is pending.  Continue droplet and contact isolation till COVID-19 testing results.  Inflammatory markers are elevated including elevated ferritin and CRP.  Follow-up in a.m.-We will try and use intravenous Solu-Medrol for the patient had refused steroids during last admission -Avoid nebulizers for now till COVID-19 testing results.  Use inhalers -ABG.  Patient is a DNR so will not intubate the patient. -If respite status does not improve, might consider CT chest. -We will also check procalcitonin -SLP eval  Paroxysmal A. Fib -Currently rate controlled.  Continue Cardizem.  Not on anticoagulation as an outpatient  Chronic diastolic heart failure -Compensated.  No evidence of fluid overload.  Echo from 08/06/2017 showed EF of 60 to 65%.  Strict input and output and daily weights.  Continue oral Lasix.  Mildly elevated troponin -No chest pain.  Troponin 23.  Will cycle troponins.  EKG showed no ST elevations or depressions.  Dementia/anxiety -We will continue Namenda.  Will  resume Klonopin once respiratory status improves.  GERD -Continue Protonix  Hyperlipidemia -Continue statin  Generalized deconditioning -Overall prognosis is guarded to poor.  Will request palliative care evaluation.  Will need PT eval once respiratory status stabilizes.  DVT prophylaxis: Lovenox Code Status: DNR Family Communication: Spoke to patient at bedside.  Called son on phone but he did not pick up Disposition Plan: Depends on clinical outcome Consults called: None Admission status: Inpatient/progressive  Severity of Illness: The appropriate patient status for this patient is INPATIENT. Inpatient status is judged to be reasonable and necessary in order to provide the required intensity of service to ensure the patient's safety. The patient's presenting symptoms, physical exam findings, and initial radiographic and laboratory data in the context of their chronic comorbidities is felt to place them at high risk for further clinical deterioration. Furthermore, it is not anticipated that the patient will be medically stable for discharge from the hospital within 2 midnights of admission. The following factors support the patient status of inpatient.   " The patient's presenting symptoms include shortness of breath. " The worrisome physical exam findings include tachypnea/scattered crackles and wheezing. " The initial radiographic and laboratory data are worrisome because of possible pneumonia. " The chronic co-morbidities include chronic hypoxic respiratory failure.   * I certify that at the point of admission it is my clinical judgment that the patient will require inpatient hospital care spanning beyond 2 midnights from the point of admission due to high intensity of service, high risk for further deterioration and high frequency of surveillance required.Aline August MD [facility]  03/19/2019, 6:14 PM

## 2019-03-14 NOTE — ED Triage Notes (Signed)
Pt bib [facility] after c/o sob. Pt recently admited for B PNA, completed ABX therapy and d/c. Pt wears 4 lpm o2 via [place] at baseline and was 74% on that when EMS arrived. Pt placed on 15 lpm via NRB and up to 97-98% spo2. EMS gave pt IM epi, solumendrol 25 mg IV, and 2g Mag. Breathing e/l. Pt denies fever, cough. CBG 160. VSS on NRB. Pt AOx3 w/ confusion at baseline secondary to dementia.

## 2019-03-14 NOTE — Progress Notes (Addendum)
Pharmacy Antibiotic Note  GREAT ISOBE is a 78 y.o. male admitted on 03/10/2019 with pneumonia.  Pharmacy has been consulted for vancomycin dosing. Cefepime 1x dose also ordered per EDP. SCr 1.42 on admit. Recent weight documented in chart as 134 lbs from 02/25/19.  Plan: Cefepime 2g IV x 1 - f/u if to continue Vancomycin 1250mg  IV x 1; then Vancomycin 750 mg IV Q 24 hrs. Goal AUC 400-550. Expected AUC: 488 SCr used: 1.42 Monitor clinical progress, c/s, renal function F/u de-escalation plan/LOT, vancomycin levels as indicated  ADDENDUM:  Pharmacy consulted to continue cefepime.  Plan: Continue cefepime 2g IV q12h      Temp (24hrs), Avg:97.5 F (36.4 C), Min:97.5 F (36.4 C), Max:97.5 F (36.4 C)  Recent Labs  Lab 03/11/2019 1620  WBC 6.6  LATICACIDVEN 1.5    CrCl cannot be calculated (Unknown ideal weight.).    Allergies  Allergen Reactions  . Ticlopidine Hcl Swelling  . Lorazepam Other (See Comments)    Altered mental status from 2 doses of IV Ativan on 05/10/14 HALLUCINATIONS AND DELIRIOUS  . Other Other (See Comments)    Narcotics: Hallucinations and paranoia  . Prednisone Other (See Comments)    NOT TRUE ALLERGY - CONFUSION    Elicia Lamp, PharmD, BCPS Please check AMION for all [facility] contact numbers Clinical Pharmacist 03/13/2019 5:12 PM

## 2019-03-14 NOTE — ED Provider Notes (Signed)
[facility] Provider Note   CSN: [id number] Arrival date & time: 03/02/2019  1516     History Chief Complaint  Patient presents with  . Shortness of Breath  . covid    [name] is a 78 y.o. male.  He has a history of COPD and is on 2 L nasal cannula.  He was just admitted to the hospital a few weeks ago for COPD and pneumonia.  Sounds like he has been home about a week.  He says he went to a party with and then a few days later he began becoming more short of breath with chest tightness nonproductive cough and increased work of breathing.  No sick contacts at home.  Per EMS his sats were low 70s on his 2 L nasal cannula and they placed him on a nonrebreather.  He is also received Solu-Medrol, magnesium, and subcu epi.  Patient states he feels a little bit better since medications.  The history is provided by the patient.  Shortness of Breath Severity:  Moderate Onset quality:  Gradual Timing:  Intermittent Progression:  Worsening Chronicity:  Recurrent Relieved by:  Nothing Worsened by:  Activity Ineffective treatments:  Inhaler Associated symptoms: chest pain (tightness) and cough   Associated symptoms: no abdominal pain, no claudication, no diaphoresis, no fever, no headaches, no neck pain, no rash, no sore throat, no sputum production, no syncope and no vomiting        Past Medical History:  Diagnosis Date  . Acute kidney injury ([place]) 05/10/2018  . Alcohol abuse   . Allergic rhinitis   . ANEMIA, B12 DEFICIENCY 12/02/2008  . Arthritis   . CAD (coronary artery disease)    a.  s/p CABG 2011.  Marland Kitchen CAP (community acquired pneumonia) 04/03/2014  . CAROTID ARTERY STENOSIS 04/16/2009  . Cataract   . Chronic diastolic CHF (congestive heart failure) ([place])   . Chronic respiratory failure ([place])   . COLONIC POLYPS 05/21/2007  . Compression fracture of L1 lumbar vertebra (HCC)   . COPD with acute exacerbation ([place]) 06/10/2016  . COPD, severe ([place])    . Diabetes mellitus without complication ([place])   . DIVERTICULAR DISEASE 05/21/2007  . Dysrhythmia   . Essential hypertension   . Falls   . GERD (gastroesophageal reflux disease)   . GI BLEEDING 09/09/2008  . HCAP (healthcare-associated pneumonia)   . Hemoptysis   . HIATAL HERNIA 05/21/2007  . History of kidney stones   . HYPERLIPIDEMIA 11/21/2006  . Hypertension 08/23/2014  . Internal hemorrhoids   . Low back pain 08/23/2014  . MYOCARDIAL INFARCTION, HX OF 11/21/2006  . NEPHROLITHIASIS 05/21/2007  . Normocytic anemia   . NSVT (nonsustained ventricular tachycardia) ([place])    a. Holter 2015: NSR, A. Fib/flutter, short runs of NSVT  . Olecranon bursitis 07/09/2014   Required surgery and 6 day hospitalization. Some chronic pain at L elbow.    . OSTEOPOROSIS 11/21/2006  . Osteoporosis   . Paroxysmal atrial fibrillation ([place])    a. Anticoag stopped due to falls, rectal bleeding.  . Paroxysmal atrial flutter ([place])   . Pneumonia   . Pulmonary nodule    a. Dx 01/2015 -> advised to f/u PCP for PET.  . Pulmonary nodules    No pulmonary nodules on CTA Chest 07/2017  . PVD (peripheral vascular disease) ([place])    a. s/p prior iliac stenting.  . Syncope 08/23/2014    Patient Active Problem List   Diagnosis Date Noted  .  Chronic respiratory failure with hypoxia ([place]) 02/25/2019  . Pulmonary hypertension ([place]) 05/13/2018  . Acute delirium   . Shortness of breath 05/11/2018  . Acute kidney injury ([place]) 05/10/2018  . Hematuria, microscopic 05/10/2018  . Dehydration 05/10/2018  . Abdominal aortic aneurysm ([place]) 05/10/2018  . Lung nodule, solitary   . Iron deficiency anemia 02/15/2015  . CAD (coronary artery disease) 09/18/2014  . COPD (chronic obstructive pulmonary disease) ([place]) 08/23/2014  . Chronic atrial fibrillation 08/23/2014  . Diabetes mellitus type II, controlled ([place]) 07/01/2014  . Acute on chronic respiratory failure ([place]) 05/14/2014  . COPD exacerbation ([place])   . Chronic  respiratory failure with hypercapnia ([place]) 05/10/2014  . CAP (community acquired pneumonia) 04/03/2014  . Allergic rhinitis 02/06/2014  . GERD (gastroesophageal reflux disease) 02/06/2014  . Do not resuscitate 02/06/2014  . Erectile dysfunction 06/18/2010  . Atrial fibrillation ([place]) 11/06/2009  . Peripheral vascular disease ([place]) 09/02/2009  . Carotid artery stenosis 04/16/2009  . ANEMIA, B12 DEFICIENCY 12/02/2008  . TOBACCO USE 01/19/2007  . Hyperlipidemia 11/21/2006  . Essential hypertension 11/21/2006  . Hx of CABG 11/21/2006  . Osteoporosis 11/21/2006    Past Surgical History:  Procedure Laterality Date  . APPENDECTOMY    . CARDIOVERSION N/A 04/16/2014   Procedure: CARDIOVERSION;  Surgeon: Josue Hector, MD;  Location: [facility] ENDOSCOPY;  Service: Cardiovascular;  Laterality: N/A;  . CHOLECYSTECTOMY    . CORONARY ARTERY BYPASS GRAFT  2011  . CORONARY ARTERY BYPASS GRAFT     x 5  . CORONARY STENT PLACEMENT     03/07/2001  . HEMORRHOID SURGERY    . INGUINAL HERNIA REPAIR  10/10/08  . MASS EXCISION Left 07/09/2014   Procedure: LEFT ELBOW BURSECTOMY EXCISION MASS;  Surgeon: Iran Planas, MD;  Location: [facility];  Service: Orthopedics;  Laterality: Left;  . OLECRANON BURSECTOMY  07/09/2014  . PTCA    . THYROIDECTOMY     partial       Family History  Problem Relation Age of Onset  . Arthritis Mother        rheumatoid  . Heart attack Father   . Heart disease Father   . Heart disease Brother   . Colon cancer Brother 49  . Heart disease Brother   . Stomach cancer Neg Hx     Social History   Tobacco Use  . Smoking status: Former Smoker    Packs/day: 3.00    Years: 50.00    Pack years: 150.00    Types: Cigarettes    Quit date: 09/25/2009    Years since quitting: 9.4  . Smokeless tobacco: Never Used  Substance Use Topics  . Alcohol use: Yes    Alcohol/week: 6.0 standard drinks    Types: 6 Standard drinks or equivalent per week    Comment: [place] 2-3 a night  . Drug  use: No    Home Medications Prior to Admission medications   Medication Sig Start Date End Date Taking? Authorizing Provider  acetaminophen (TYLENOL) 500 MG tablet Take 1,000 mg by mouth every 6 (six) hours as needed for headache (pain).    [provider]  Alogliptin Benzoate 12.5 MG TABS Take 12.5 mg by mouth every morning.    [provider]  arformoterol (BROVANA) 15 MCG/2ML NEBU Take 15 mcg by nebulization 2 (two) times daily.    [provider]  atorvastatin (LIPITOR) 40 MG tablet Take 40 mg by mouth at bedtime.     [provider]  azithromycin (ZITHROMAX) 250 MG  tablet Take 500 mg by mouth every Monday, Wednesday, and Friday.    [provider]  benzonatate (TESSALON) 100 MG capsule Take 1 capsule (100 mg total) by mouth 3 (three) times daily. Patient not taking: Reported on 05/10/2018 03/19/18   Lavina Hamman, MD  budesonide (PULMICORT) 0.5 MG/2ML nebulizer solution Take 0.5 mg by nebulization 2 (two) times daily.    [provider]  Calcium Carbonate-Vitamin D (CALCIUM 600+D) 600-400 MG-UNIT tablet Take 1 tablet by mouth 2 (two) times daily.    [provider]  cetirizine (ZYRTEC ALLERGY) 10 MG tablet Take 10 mg by mouth every morning.  08/17/10   Parrett, Fonnie Mu, NP  clonazePAM (KLONOPIN) 0.5 MG tablet Take 0.5 mg by mouth at bedtime.    [provider]  diltiazem (CARDIZEM CD) 180 MG 24 hr capsule Take 180 mg by mouth every morning.  01/21/16   Sherren Mocha, MD  famotidine (PEPCID) 40 MG tablet Take 40 mg by mouth at bedtime.    [provider]  feeding supplement, GLUCERNA SHAKE, (GLUCERNA SHAKE) LIQD Take 237 mLs by mouth 3 (three) times daily between meals. Patient not taking: Reported on 05/10/2018 06/14/16   Molt, Bethany, DO  ferrous gluconate (FERGON) 324 MG tablet Take 324 mg by mouth 2 (two) times daily with a meal.    [provider]  furosemide (LASIX) 20 MG tablet Take 20 mg by  mouth every morning.     [provider]  guaifenesin (HUMIBID E) 400 MG TABS tablet Take 400 mg by mouth 2 (two) times daily.    [provider]  guaiFENesin-dextromethorphan (ROBITUSSIN DM) 100-10 MG/5ML syrup Take 5 mLs by mouth 3 (three) times daily as needed for cough.    [provider]  ipratropium-albuterol (DUONEB) 0.5-2.5 (3) MG/3ML SOLN Take 3 mLs by nebulization 4 (four) times daily. Patient not taking: Reported on 05/10/2018 03/19/18   Lavina Hamman, MD  LACTOBACILLUS PO Take 1 capsule by mouth 2 (two) times daily.     [provider]  Melatonin 3 MG TABS Take 6 mg by mouth at bedtime.    [provider]  memantine (NAMENDA) 10 MG tablet Take 20 mg by mouth at bedtime.     [provider]  OXYGEN Inhale 3 L into the lungs continuous.     [provider]  pantoprazole (PROTONIX) 40 MG tablet TAKE 1 TABLET BY MOUTH TWICE A DAY Patient taking differently: Take 40 mg by mouth 2 (two) times daily.  05/19/14   Marin Olp, MD  potassium chloride SA (K-DUR,KLOR-CON) 20 MEQ tablet Take 20 mEq by mouth every morning.     [provider]  umeclidinium-vilanterol (ANORO ELLIPTA) 62.5-25 MCG/INH AEPB Inhale 1 puff into the lungs daily. Patient not taking: Reported on 02/25/2019 05/15/18   Bonnita Hollow, MD    Allergies    Ticlopidine hcl, Lorazepam, Other, and Prednisone  Review of Systems   Review of Systems  Constitutional: Negative for diaphoresis and fever.  HENT: Negative for sore throat.   Eyes: Negative for visual disturbance.  Respiratory: Positive for cough, chest tightness and shortness of breath. Negative for sputum production.   Cardiovascular: Positive for chest pain (tightness). Negative for claudication, leg swelling and syncope.  Gastrointestinal: Positive for diarrhea (x1). Negative for abdominal pain and vomiting.  Genitourinary: Negative for dysuria.  Musculoskeletal: Negative for neck  pain.  Skin: Negative for rash.  Neurological: Negative for headaches.    Physical Exam Updated  Vital Signs BP 123/70 (BP Location: Left Arm)   Pulse 86   Temp (!) 97.5 F (36.4 C)   Resp 20   SpO2 98%   Physical Exam Vitals and nursing note reviewed.  Constitutional:      Appearance: He is well-developed.  HENT:     Head: Normocephalic and atraumatic.  Eyes:     Conjunctiva/sclera: Conjunctivae normal.  Cardiovascular:     Rate and Rhythm: Normal rate and regular rhythm.     Heart sounds: No murmur.  Pulmonary:     Effort: Tachypnea and accessory muscle usage present. No respiratory distress.     Breath sounds: Decreased breath sounds present.  Abdominal:     Palpations: Abdomen is soft.     Tenderness: There is no abdominal tenderness.  Musculoskeletal:        General: Normal range of motion.     Cervical back: Neck supple.     Right lower leg: No tenderness. No edema.     Left lower leg: No tenderness. No edema.  Skin:    General: Skin is warm and dry.     Capillary Refill: Capillary refill takes less than 2 seconds.  Neurological:     General: No focal deficit present.     Mental Status: He is alert.     ED Results / Procedures / Treatments   Labs (all labs ordered are listed, but only abnormal results are displayed) Labs Reviewed  CBC WITH DIFFERENTIAL/PLATELET - Abnormal; Notable for the following components:      Result Value   MCV 101.6 (*)    Platelets 90 (*)    Lymphs Abs 0.2 (*)    All other components within normal limits  COMPREHENSIVE METABOLIC PANEL - Abnormal; Notable for the following components:   Glucose, Bld 150 (*)    Creatinine, Ser 1.42 (*)    Calcium 8.4 (*)    Total Protein 6.2 (*)    Albumin 2.7 (*)    GFR calc non Af Amer 47 (*)    GFR calc Af Amer 54 (*)    All other components within normal limits  D-DIMER, QUANTITATIVE (NOT AT [facility]) - Abnormal; Notable for the following components:   D-Dimer, Quant 2.23 (*)    All other  components within normal limits  LACTATE DEHYDROGENASE - Abnormal; Notable for the following components:   LDH 291 (*)    All other components within normal limits  FERRITIN - Abnormal; Notable for the following components:   Ferritin 1,903 (*)    All other components within normal limits  FIBRINOGEN - Abnormal; Notable for the following components:   Fibrinogen 787 (*)    All other components within normal limits  C-REACTIVE PROTEIN - Abnormal; Notable for the following components:   CRP 17.3 (*)    All other components within normal limits  COMPREHENSIVE METABOLIC PANEL - Abnormal; Notable for the following components:   Glucose, Bld 259 (*)    BUN 30 (*)    Creatinine, Ser 1.42 (*)    Calcium 8.3 (*)    Total Protein 5.6 (*)    Albumin 2.5 (*)    GFR calc non Af Amer 47 (*)    GFR calc Af Amer 54 (*)    All other components within normal limits  CBC - Abnormal; Notable for the following components:   WBC 3.2 (*)    MCV 100.9 (*)    Platelets 87 (*)    All other components within normal limits  FERRITIN - Abnormal; Notable for the following components:   Ferritin 2,232 (*)    All other components within normal limits  C-REACTIVE PROTEIN - Abnormal; Notable for the following components:   CRP 16.2 (*)    All other components within normal limits  LACTATE DEHYDROGENASE - Abnormal; Notable for the following components:   LDH 306 (*)    All other components within normal limits  POC SARS CORONAVIRUS 2 AG -  ED - Abnormal; Notable for the following components:   SARS Coronavirus 2 Ag POSITIVE (*)    All other components within normal limits  POCT I-STAT 7, (LYTES, BLD GAS, ICA,H+H) - Abnormal; Notable for the following components:   pO2, Arterial 147.0 (*)    Calcium, Ion 1.14 (*)    All other components within normal limits  TROPONIN I (HIGH SENSITIVITY) - Abnormal; Notable for the following components:   Troponin I (High Sensitivity) 23 (*)    All other components within  normal limits  TROPONIN I (HIGH SENSITIVITY) - Abnormal; Notable for the following components:   Troponin I (High Sensitivity) 19 (*)    All other components within normal limits  RESPIRATORY PANEL BY PCR  SURGICAL PCR SCREEN  CULTURE, BLOOD (ROUTINE X 2)  CULTURE, BLOOD (ROUTINE X 2)  LACTIC ACID, PLASMA  LACTIC ACID, PLASMA  PROCALCITONIN  TRIGLYCERIDES  BRAIN NATRIURETIC PEPTIDE  STREP PNEUMONIAE URINARY ANTIGEN  LEGIONELLA PNEUMOPHILA SEROGP 1 UR AG  I-STAT ARTERIAL BLOOD GAS, ED    EKG EKG Interpretation  Date/Time:  Thursday March 14 2019 15:43:15 EST Ventricular Rate:  90 PR Interval:    QRS Duration: 105 QT Interval:  359 QTC Calculation: 440 R Axis:   -76 Text Interpretation: Atrial fibrillation Abnormal R-wave progression, early transition Inferior infarct, old decreased PVCs compared with prior 11/20 Confirmed by Aletta Edouard [id number]) on 03/08/2019 3:45:39 PM   Radiology DG Chest Port 1 View  Result Date: 03/20/2019 CLINICAL DATA:  Shortness of breath. Additional provided: Shortness of breath, patient denies fever or cough. History of dementia. EXAM: PORTABLE CHEST 1 VIEW COMPARISON:  Chest radiograph 07/16/2018 FINDINGS: Mild cardiomegaly, unchanged. Sequela of prior median sternotomy/CABG. Airspace opacity at the right lung base has increased since prior examination 03/01/2019. New subtle pulmonary opacities also questioned within the left midlung. No sizable pleural effusion or evidence of pneumothorax. No acute bony abnormality. IMPRESSION: Interval increase in right basilar airspace opacity since prior exam 03/01/2019. New subtle pulmonary opacities also questioned within the left mid lung. Findings are nonspecific but most suspicious for pneumonia. Clinical correlation and radiographic follow-up to resolution recommended. Cardiomegaly status post CABG. Electronically Signed   By: Kellie Simmering DO   On: 03/19/2019 16:05    Procedures .Critical  Care Performed by: Hayden Rasmussen, MD Authorized by: Hayden Rasmussen, MD   Critical care provider statement:    Critical care time (minutes):  45   Critical care time was exclusive of:  Separately billable procedures and treating other patients   Critical care was necessary to treat or prevent imminent or life-threatening deterioration of the following conditions:  Respiratory failure   Critical care was time spent personally by me on the following activities:  Discussions with consultants, evaluation of patient's response to treatment, examination of patient, ordering and performing treatments and interventions, ordering and review of laboratory studies, ordering and review of radiographic studies, pulse oximetry, re-evaluation of patient's condition, obtaining history from patient or surrogate, review of old charts and development of treatment plan with  patient or surrogate   I assumed direction of critical care for this patient from another provider in my specialty: no     (including critical care time)  Medications Ordered in ED Medications  atorvastatin (LIPITOR) tablet 40 mg (has no administration in time range)  diltiazem (CARDIZEM CD) 24 hr capsule 180 mg (180 mg Oral Given 03/15/19 0906)  furosemide (LASIX) tablet 20 mg (20 mg Oral Given 03/15/19 0910)  memantine (NAMENDA) tablet 20 mg (has no administration in time range)  famotidine (PEPCID) tablet 40 mg (has no administration in time range)  pantoprazole (PROTONIX) EC tablet 40 mg (40 mg Oral Given 03/15/19 0910)  guaiFENesin-dextromethorphan (ROBITUSSIN DM) 100-10 MG/5ML syrup 5 mL (has no administration in time range)  enoxaparin (LOVENOX) injection 40 mg (40 mg Subcutaneous Given 03/01/2019 2223)  acetaminophen (TYLENOL) tablet 650 mg (has no administration in time range)    Or  acetaminophen (TYLENOL) suppository 650 mg (has no administration in time range)  senna-docusate (Senokot-S) tablet 1 tablet (has no  administration in time range)  ondansetron (ZOFRAN) tablet 4 mg (has no administration in time range)    Or  ondansetron (ZOFRAN) injection 4 mg (has no administration in time range)  methylPREDNISolone sodium succinate (SOLU-MEDROL) 125 mg/2 mL injection 60 mg (60 mg Intravenous Given 03/15/19 0615)  mometasone-formoterol (DULERA) 200-5 MCG/ACT inhaler 1 puff (1 puff Inhalation Given 03/15/19 0912)  ceFEPIme (MAXIPIME) 2 g in sodium chloride 0.9 % 100 mL IVPB (0 g Intravenous Stopped 03/15/19 1020)  remdesivir 200 mg in sodium chloride 0.9% 250 mL IVPB (200 mg Intravenous New Bag/Given 03/15/19 1023)    Followed by  remdesivir 100 mg in sodium chloride 0.9 % 100 mL IVPB (has no administration in time range)  ceFEPIme (MAXIPIME) 2 g in sodium chloride 0.9 % 100 mL IVPB (0 g Intravenous Stopped 03/17/2019 2000)  vancomycin (VANCOREADY) IVPB 1250 mg/250 mL (0 mg Intravenous Stopped 03/24/2019 2114)  haloperidol lactate (HALDOL) injection 2.5 mg (2.5 mg Intravenous Given 03/15/19 0615)    ED Course  I have reviewed the triage vital signs and the nursing notes.  Pertinent labs & imaging results that were available during my care of the patient were reviewed by me and considered in my medical decision making (see chart for details).  Clinical Course as of Mar 13 1757  Thu Mar 13, 3218  1326 77 year old male with recent admission for pneumonia here with increased shortness of breath cough chest tightness over a few days.  Hypoxic despite his nasal cannula oxygen on EMS arrival.  Differential includes COPD exacerbation, CHF, pneumonia, Covid, PE.    [MB]  [id number] Chest x-ray interpreted by me as right lower lobe pneumonia.   [MB]  [id number] Radiology reading the chest x-ray as likely worsening right lower lobe and possible infiltrates left side.  Will order antibiotics.   [MB]  [id number] Patient's labs started to come back showing normal lactate of 1.5.  His platelets are newly low at 90.  We tried to wean his  oxygen down from a nonrebreather and he did okay for a little while but now is dropped his sats in the 70s and is back on the nonrebreather.   [MB]  1737 Discussed with [facility] hospitalist who will evaluate the patient for admission.   [MB]    Clinical Course User Index [MB] Hayden Rasmussen, MD   MDM Rules/Calculators/A&P                     Jeffery Dibbles  NAVEEN Newman was evaluated in Emergency Department on 03/20/2019 for the symptoms described in the history of present illness. He was evaluated in the context of the global COVID-19 pandemic, which necessitated consideration that the patient might be at risk for infection with the SARS-CoV-2 virus that causes COVID-19. Institutional protocols and algorithms that pertain to the evaluation of patients at risk for COVID-19 are in a state of rapid change based on information released by regulatory bodies including the CDC and federal and state organizations. These policies and algorithms were followed during the patient's care in the ED.  CHA2DS2/VAS Stroke Risk Points  Current as of 3 minutes ago     6 >= 2 Points: High Risk  1 - 1.99 Points: Medium Risk  0 Points: Low Risk    This is the only CHA2DS2/VAS Stroke Risk Points available for the past  year.: Last Change: N/A     Details    This score determines the patient's risk of having a stroke if the  patient has atrial fibrillation.       Points Metrics  1 Has Congestive Heart Failure:  Yes    Current as of 3 minutes ago  1 Has Vascular Disease:  Yes    Current as of 3 minutes ago  1 Has Hypertension:  Yes    Current as of 3 minutes ago  2 Age:  67    Current as of 3 minutes ago  1 Has Diabetes:  Yes    Current as of 3 minutes ago  0 Had Stroke:  No  Had TIA:  No  Had thromboembolism:  No    Current as of 3 minutes ago  0 Male:  No    Current as of 3 minutes ago           Final Clinical Impression(s) / ED Diagnoses Final diagnoses:  HCAP (healthcare-associated pneumonia)  Hypoxia   COVID-19 virus infection    Rx / DC Orders ED Discharge Orders    None       Hayden Rasmussen, MD 03/15/19 1037

## 2019-03-14 NOTE — ED Notes (Signed)
Pt is confused. Attempting to get out of bed. Pulled off ECG leads and and BP cuff. Instructed pt to stay in bed. Placed pt back on monitor

## 2019-03-15 ENCOUNTER — Other Ambulatory Visit: Payer: Self-pay

## 2019-03-15 DIAGNOSIS — J9691 Respiratory failure, unspecified with hypoxia: Secondary | ICD-10-CM

## 2019-03-15 DIAGNOSIS — U071 COVID-19: Principal | ICD-10-CM

## 2019-03-15 DIAGNOSIS — J1289 Other viral pneumonia: Secondary | ICD-10-CM

## 2019-03-15 DIAGNOSIS — E118 Type 2 diabetes mellitus with unspecified complications: Secondary | ICD-10-CM

## 2019-03-15 LAB — RESPIRATORY PANEL BY PCR

## 2019-03-15 LAB — COMPREHENSIVE METABOLIC PANEL
ALT: 25 U/L (ref 0–44)
AST: 29 U/L (ref 15–41)
Albumin: 2.5 g/dL — ABNORMAL LOW (ref 3.5–5.0)
Alkaline Phosphatase: 42 U/L (ref 38–126)
Anion gap: 14 (ref 5–15)
BUN: 30 mg/dL — ABNORMAL HIGH (ref 8–23)
CO2: 23 mmol/L (ref 22–32)
Calcium: 8.3 mg/dL — ABNORMAL LOW (ref 8.9–10.3)
Chloride: 103 mmol/L (ref 98–111)
Creatinine, Ser: 1.42 mg/dL — ABNORMAL HIGH (ref 0.61–1.24)
GFR calc Af Amer: 54 mL/min — ABNORMAL LOW (ref >60)
GFR calc non Af Amer: 47 mL/min — ABNORMAL LOW (ref >60)
Glucose, Bld: 259 mg/dL — ABNORMAL HIGH (ref 70–99)
Potassium: 4.2 mmol/L (ref 3.5–5.1)
Sodium: 140 mmol/L (ref 135–145)
Total Bilirubin: 1 mg/dL (ref 0.3–1.2)
Total Protein: 5.6 g/dL — ABNORMAL LOW (ref 6.5–8.1)

## 2019-03-15 LAB — CBC
HCT: 43.4 % (ref 39.0–52.0)
Hemoglobin: 14 g/dL (ref 13.0–17.0)
MCH: 32.6 pg (ref 26.0–34.0)
MCHC: 32.3 g/dL (ref 30.0–36.0)
MCV: 100.9 fL — ABNORMAL HIGH (ref 80.0–100.0)
Platelets: 87 10*3/uL — ABNORMAL LOW (ref 150–400)
RBC: 4.3 MIL/uL (ref 4.22–5.81)
RDW: 14.5 % (ref 11.5–15.5)
WBC: 3.2 10*3/uL — ABNORMAL LOW (ref 4.0–10.5)
nRBC: 0 % (ref 0.0–0.2)

## 2019-03-15 LAB — FERRITIN: Ferritin: 2232 ng/mL — ABNORMAL HIGH (ref 24–336)

## 2019-03-15 LAB — SURGICAL PCR SCREEN
MRSA, PCR: NEGATIVE
Staphylococcus aureus: NEGATIVE

## 2019-03-15 LAB — C-REACTIVE PROTEIN: CRP: 16.2 mg/dL — ABNORMAL HIGH (ref <1.0)

## 2019-03-15 LAB — LACTATE DEHYDROGENASE: LDH: 306 U/L — ABNORMAL HIGH (ref 98–192)

## 2019-03-15 MED ORDER — SODIUM CHLORIDE 0.9 % IV SOLN
100.0000 mg | Freq: Every day | INTRAVENOUS | Status: DC
  Administered 2019-03-16 – 2019-03-18 (×3): 100 mg via INTRAVENOUS
  Filled 2019-03-15 (×5): qty 20

## 2019-03-15 MED ORDER — HALOPERIDOL LACTATE 5 MG/ML IJ SOLN
2.5000 mg | Freq: Once | INTRAMUSCULAR | Status: AC
  Administered 2019-03-15: 06:00:00 2.5 mg via INTRAVENOUS
  Filled 2019-03-15: qty 1

## 2019-03-15 MED ORDER — ALBUTEROL SULFATE HFA 108 (90 BASE) MCG/ACT IN AERS
2.0000 | INHALATION_SPRAY | RESPIRATORY_TRACT | Status: DC | PRN
  Administered 2019-03-15 – 2019-03-16 (×2): 2 via RESPIRATORY_TRACT
  Filled 2019-03-15: qty 6.7

## 2019-03-15 MED ORDER — CLONAZEPAM 0.5 MG PO TABS
0.2500 mg | ORAL_TABLET | Freq: Every day | ORAL | Status: DC
  Filled 2019-03-15: qty 1

## 2019-03-15 MED ORDER — IPRATROPIUM BROMIDE HFA 17 MCG/ACT IN AERS
2.0000 | INHALATION_SPRAY | RESPIRATORY_TRACT | Status: DC
  Administered 2019-03-16 (×6): 2 via RESPIRATORY_TRACT
  Filled 2019-03-15: qty 12.9

## 2019-03-15 MED ORDER — CLONAZEPAM 0.125 MG PO TBDP
0.2500 mg | ORAL_TABLET | Freq: Every day | ORAL | Status: DC
  Administered 2019-03-15 – 2019-03-17 (×3): 0.25 mg via ORAL
  Filled 2019-03-15: qty 2
  Filled 2019-03-15: qty 1
  Filled 2019-03-15: qty 2

## 2019-03-15 MED ORDER — SODIUM CHLORIDE 0.9 % IV SOLN
200.0000 mg | Freq: Once | INTRAVENOUS | Status: AC
  Administered 2019-03-15: 10:00:00 200 mg via INTRAVENOUS
  Filled 2019-03-15: qty 200

## 2019-03-15 NOTE — ED Notes (Signed)
O2 sats dropped to 59% on 4L nasal cannula. Place pt on non rebreather, sats came up to 98%

## 2019-03-15 NOTE — Progress Notes (Addendum)
PROGRESS NOTE  [name] [id number] DOB: 27-Nov-1940 DOA: 03/28/2019 PCP: Reubin Milan, MD   LOS: 1 day   Brief narrative:   [name] is a 78 y.o. male with medical history significant of chronic hypoxic respiratory failure on 2 to 4 L of oxygen at home, COPD, hypertension, coronary disease status post CABG, hyperlipidemia, paroxysmal A. fib, recent admission and discharge around 2 weeks ago for COPD exacerbation/pneumonia presented to the hospital with worsening shortness of breath.  Patient is a poor historian.  Patient stated that he has been more short of breath for a week.  He states that he went to a party more than a week ago and few days later become more short of breath with chest tightness, nonproductive cough, increased work of breathing.  Denies any contact with any Covid patients.  No fever, nausea or vomiting reported no abdominal pain, diarrhea, dysuria, leg swelling, loss of consciousness, seizures.  ED Course: Patient was placed on a nonrebreather per EMS and he received Solu-Medrol and magnesium.  In the ED, chest x-ray showed possibility of pneumonia.  He was started on intravenous antibiotics.  Hospitalist service was called to evaluate the patient.  Patient subsequently tested COVID-19 positive.  Assessment/Plan:  Principal Problem:   Pneumonia due to COVID-19 virus Active Problems:   Hyperlipidemia   Atrial fibrillation (HCC)   COPD exacerbation (HCC)   Acute on chronic respiratory failure with hypoxia (HCC)   Diabetes mellitus type II, controlled ([place])   Respiratory failure ([place])   Acute on chronic hypoxic respiratory failure secondary to Covid pneumonia with  COPD exacerbation. Patient uses 2 to 4 L oxygen at home.  Currently on 6 litres/minute. Wean off as able. Chest x-ray shows new subtle pulmonary opacities within the left mid lung.  Cefepime and vancomycin was initiated but will discontinue vancomycin at this time.   Urinary strep antigen was  negative.  Legionella pending. Follow inflammatory markers.  On IV Solu-Medrol and remdesivir.  Continue inhalers.  Paroxysmal A. Fib Currently rate controlled.  Continue Cardizem.  Not on anticoagulation as an outpatient  Chronic diastolic heart failure -Compensated this time..  No evidence of fluid overload.    ED echo from 08/06/2017 showed EF of 60 to 65%.  Strict input and output and daily weights.  Continue oral Lasix.  Mildly elevated troponin -Likely nonspecific.  No chest pain.  Troponin 23-19.   EKG showed no ST elevations or depressions.  Dementia/anxiety -We will continue Namenda.  Will resume Klonopin at half dose from home dose at home.  GERD -Continue Protonix  Hyperlipidemia -Continue statin  Generalized deconditioning -Overall prognosis is guarded to poor.  palliative care consulted.  Will need PT eval once respiratory status stabilizes.  VTE Prophylaxis: Lovenox  Code Status: DNR  Family Communication: I called the patient's son Mr Taitum Wengerd and updated him about the clinical condition of the patient.  Spoke about  potential clinical deterioration on the background of COPD and oxygen dependence.  Disposition Plan: Inpatient.  Transfer to [facility] today when bed is stable..   Consultants:  None  Procedures:  None  Antibiotics: Anti-infectives (From admission, onward)   Start     Dose/Rate Route Frequency Ordered Stop   03/16/19 1000  remdesivir 100 mg in sodium chloride 0.9 % 100 mL IVPB     100 mg 200 mL/hr over 30 Minutes Intravenous Daily 03/15/19 0813 03/20/19 0959   03/15/19 1800  vancomycin (VANCOREADY) IVPB 750 mg/150 mL  Status:  Discontinued     750 mg 150 mL/hr over 60 Minutes Intravenous Every 24 hours 03/15/2019 1723 03/15/19 0903   03/15/19 0900  remdesivir 200 mg in sodium chloride 0.9% 250 mL IVPB     200 mg 580 mL/hr over 30 Minutes Intravenous Once 03/15/19 0813     03/15/19 0700  ceFEPIme (MAXIPIME) 2 g in sodium chloride 0.9 %  100 mL IVPB     2 g 200 mL/hr over 30 Minutes Intravenous Every 12 hours 03/20/2019 1829     03/23/2019 1715  vancomycin (VANCOREADY) IVPB 1250 mg/250 mL     1,250 mg 166.7 mL/hr over 90 Minutes Intravenous  Once 03/07/2019 1712 03/09/2019 2114   03/01/2019 1645  ceFEPIme (MAXIPIME) 2 g in sodium chloride 0.9 % 100 mL IVPB     2 g 200 mL/hr over 30 Minutes Intravenous  Once 02/28/2019 1640 03/25/2019 2000      Subjective: Today, patient still complains of dyspnea, difficulty breathing.  Denies any chest pain fever chills  Objective: Vitals:   03/15/19 0914 03/15/19 1030  BP:    Pulse: 86 61  Resp: (!) 29 (!) 22  Temp:  (!) 97.5 F (36.4 C)  SpO2: 92% (!) 81%    Intake/Output Summary (Last 24 hours) at 03/15/2019 1048 Last data filed at 03/15/2019 1020 Gross per 24 hour  Intake 450 ml  Output --  Net 450 ml   Filed Weights   02/27/2019 1946  Weight: 60.8 kg   Body mass index is 22.31 kg/m.   Physical Exam: GENERAL: Patient is alert awake and oriented. Not in obvious distress.  On 6 L of nasal cannula oxygen.  Looks anxious HENT: No scleral pallor or icterus. Pupils equally reactive to light. Oral mucosa is moist NECK: is supple, no palpable thyroid enlargement. CHEST: Coarse breath sounds noted.  Occasional rhonchi. Diminished breath sounds bilaterally. CVS: S1 and S2 heard, no murmur. Regular rate and rhythm. No pericardial rub. ABDOMEN: Soft, non-tender, bowel sounds are present. No palpable hepato-splenomegaly. EXTREMITIES: No edema. CNS: Cranial nerves are intact. No focal motor or sensory deficits. SKIN: warm and dry without rashes.  Data Review: I have personally reviewed the following laboratory data and studies,  CBC: Recent Labs  Lab 03/03/2019 1620 03/24/2019 1816 03/15/19 0353  WBC 6.6  --  3.2*  NEUTROABS 6.1  --   --   HGB 14.8 13.6 14.0  HCT 45.2 40.0 43.4  MCV 101.6*  --  100.9*  PLT 90*  --  87*   Basic Metabolic Panel: Recent Labs  Lab 02/26/2019 1620  03/26/2019 1816 03/15/19 0353  NA 140 138 140  K 4.5 4.5 4.2  CL 101  --  103  CO2 25  --  23  GLUCOSE 150*  --  259*  BUN 20  --  30*  CREATININE 1.42*  --  1.42*  CALCIUM 8.4*  --  8.3*   Liver Function Tests: Recent Labs  Lab 03/16/2019 1620 03/15/19 0353  AST 30 29  ALT 24 25  ALKPHOS 43 42  BILITOT 1.2 1.0  PROT 6.2* 5.6*  ALBUMIN 2.7* 2.5*   No results for input(s): LIPASE, AMYLASE in the last 168 hours. No results for input(s): AMMONIA in the last 168 hours. Cardiac Enzymes: No results for input(s): CKTOTAL, CKMB, CKMBINDEX, TROPONINI in the last 168 hours. BNP (last 3 results) Recent Labs    02/25/19 0954 03/01/19 0248 03/02/2019 1620  BNP 205.7* 1,146.2* 82.6    ProBNP (last 3  results) No results for input(s): PROBNP in the last 8760 hours.  CBG: No results for input(s): GLUCAP in the last 168 hours. Recent Results (from the past 240 hour(s))  Respiratory Panel by PCR     Status: None   Collection Time: 03/07/2019 10:06 PM   Specimen: Nasopharyngeal Swab; Respiratory  Result Value Ref Range Status   Adenovirus NOT DETECTED NOT DETECTED Final   Coronavirus 229E NOT DETECTED NOT DETECTED Final    Comment: (NOTE) The Coronavirus on the Respiratory Panel, DOES NOT test for the novel  Coronavirus (2019 nCoV)    Coronavirus HKU1 NOT DETECTED NOT DETECTED Final   Coronavirus NL63 NOT DETECTED NOT DETECTED Final   Coronavirus OC43 NOT DETECTED NOT DETECTED Final   Metapneumovirus NOT DETECTED NOT DETECTED Final   Rhinovirus / Enterovirus NOT DETECTED NOT DETECTED Final   Influenza A NOT DETECTED NOT DETECTED Final   Influenza B NOT DETECTED NOT DETECTED Final   Parainfluenza Virus 1 NOT DETECTED NOT DETECTED Final   Parainfluenza Virus 2 NOT DETECTED NOT DETECTED Final   Parainfluenza Virus 3 NOT DETECTED NOT DETECTED Final   Parainfluenza Virus 4 NOT DETECTED NOT DETECTED Final   Respiratory Syncytial Virus NOT DETECTED NOT DETECTED Final   Bordetella  pertussis NOT DETECTED NOT DETECTED Final   Chlamydophila pneumoniae NOT DETECTED NOT DETECTED Final   Mycoplasma pneumoniae NOT DETECTED NOT DETECTED Final    Comment: Performed at [facility], [address], [place], [place] [address]  Surgical pcr screen     Status: None   Collection Time: 03/27/2019 10:06 PM   Specimen: Nasopharyngeal Swab; Nasal Swab  Result Value Ref Range Status   MRSA, PCR NEGATIVE NEGATIVE Final   Staphylococcus aureus NEGATIVE NEGATIVE Final    Comment: (NOTE) The Xpert SA Assay (FDA approved for NASAL specimens in patients 72 years of age and older), is one component of a comprehensive surveillance program. It is not intended to diagnose infection nor to guide or monitor treatment. Performed at [facility], [place] [address], [place], [place] [address]      Studies: DG Chest Port 1 View  Result Date: 03/25/2019 CLINICAL DATA:  Shortness of breath. Additional provided: Shortness of breath, patient denies fever or cough. History of dementia. EXAM: PORTABLE CHEST 1 VIEW COMPARISON:  Chest radiograph 07/16/2018 FINDINGS: Mild cardiomegaly, unchanged. Sequela of prior median sternotomy/CABG. Airspace opacity at the right lung base has increased since prior examination 03/01/2019. New subtle pulmonary opacities also questioned within the left midlung. No sizable pleural effusion or evidence of pneumothorax. No acute bony abnormality. IMPRESSION: Interval increase in right basilar airspace opacity since prior exam 03/01/2019. New subtle pulmonary opacities also questioned within the left mid lung. Findings are nonspecific but most suspicious for pneumonia. Clinical correlation and radiographic follow-up to resolution recommended. Cardiomegaly status post CABG. Electronically Signed   By: Kellie Simmering DO   On: 03/04/2019 16:05    Scheduled Meds: . atorvastatin  40 mg Oral QHS  . diltiazem  180 mg Oral q morning - 10a  . enoxaparin (LOVENOX) injection  40 mg  Subcutaneous Q24H  . famotidine  40 mg Oral QHS  . furosemide  20 mg Oral q morning - 10a  . memantine  20 mg Oral QHS  . methylPREDNISolone (SOLU-MEDROL) injection  60 mg Intravenous Q8H  . mometasone-formoterol  1 puff Inhalation BID  . pantoprazole  40 mg Oral BID    Continuous Infusions: . ceFEPime (MAXIPIME) IV Stopped (03/15/19  1020)  . remdesivir 200 mg in sodium chloride 0.9% 250 mL IVPB 200 mg (03/15/19 1023)   Followed by  . [START ON 03/16/2019] remdesivir 100 mg in NS 100 mL       Flora Lipps, MD  [facility] 03/15/2019

## 2019-03-15 NOTE — ED Notes (Signed)
Pt found having taken oxygen off. o2 sat, 78. Pt now on 6L nasal cannula 02 sat. [place]

## 2019-03-15 NOTE — Progress Notes (Signed)
PT Cancellation Note  Patient Details Name: MANOLO SEILER MRN: [id number] DOB: Mar 28, 1941   Cancelled Treatment:    Reason Eval/Treat Not Completed: Medical issues which prohibited therapy; RN reports pt desaturating on O2 just with changing his bed.  Will attempt again another day.    Reginia Naas 03/15/2019, 2:48 PM  Magda Kiel, [place] [phone] 03/15/2019

## 2019-03-15 NOTE — ED Notes (Signed)
Dr. Louanne Bel[place]aged to [phone] Lattie Haw, RN paged by Levada Dy

## 2019-03-15 NOTE — ED Notes (Signed)
Son -- updated on phone -- [name]. [phone] -- POA/emergency contact.

## 2019-03-15 NOTE — ED Notes (Signed)
Pt confused, will not stay in bed or keep oxygen on. o2 sats dropped to 50's when oxygen is off. Mitts placed on pt. Will continue to monitor

## 2019-03-15 NOTE — ED Notes (Signed)
[name] son [phone] looking for an update on the patient

## 2019-03-15 NOTE — ED Notes (Signed)
Lunch Tray Ordered @ 1045 

## 2019-03-15 NOTE — ED Notes (Signed)
Pt still able to remove oxygen with mittens on. Pt reminded multiple times to stay in bed and leave O2 on. Pt is confused and continues to remove oxygen and monitor

## 2019-03-15 NOTE — ED Notes (Signed)
Pt's sats dropped to low 80's on 6L/[place]-- resp 24-- after having bed changed.  Will inform MD

## 2019-03-15 NOTE — ED Notes (Signed)
Pt able to ambulate in room with assistance

## 2019-03-16 DIAGNOSIS — G309 Alzheimer's disease, unspecified: Secondary | ICD-10-CM

## 2019-03-16 DIAGNOSIS — I5032 Chronic diastolic (congestive) heart failure: Secondary | ICD-10-CM | POA: Diagnosis present

## 2019-03-16 DIAGNOSIS — F0280 Dementia in other diseases classified elsewhere without behavioral disturbance: Secondary | ICD-10-CM

## 2019-03-16 LAB — COMPREHENSIVE METABOLIC PANEL
ALT: 24 U/L (ref 0–44)
AST: 29 U/L (ref 15–41)
Albumin: 2.6 g/dL — ABNORMAL LOW (ref 3.5–5.0)
Alkaline Phosphatase: 49 U/L (ref 38–126)
Anion gap: 14 (ref 5–15)
BUN: 34 mg/dL — ABNORMAL HIGH (ref 8–23)
CO2: 23 mmol/L (ref 22–32)
Calcium: 8.4 mg/dL — ABNORMAL LOW (ref 8.9–10.3)
Chloride: 105 mmol/L (ref 98–111)
Creatinine, Ser: 1.19 mg/dL (ref 0.61–1.24)
GFR calc Af Amer: >60 mL/min (ref >60)
GFR calc non Af Amer: 58 mL/min — ABNORMAL LOW (ref >60)
Glucose, Bld: 252 mg/dL — ABNORMAL HIGH (ref 70–99)
Potassium: 4.3 mmol/L (ref 3.5–5.1)
Sodium: 142 mmol/L (ref 135–145)
Total Bilirubin: 0.9 mg/dL (ref 0.3–1.2)
Total Protein: 5.7 g/dL — ABNORMAL LOW (ref 6.5–8.1)

## 2019-03-16 LAB — CBC WITH DIFFERENTIAL/PLATELET
Abs Immature Granulocytes: 0.07 10*3/uL (ref 0.00–0.07)
Basophils Absolute: 0 10*3/uL (ref 0.0–0.1)
Basophils Relative: 0 %
Eosinophils Absolute: 0 10*3/uL (ref 0.0–0.5)
Eosinophils Relative: 0 %
HCT: 42.2 % (ref 39.0–52.0)
Hemoglobin: 14.1 g/dL (ref 13.0–17.0)
Immature Granulocytes: 1 %
Lymphocytes Relative: 2 %
Lymphs Abs: 0.3 10*3/uL — ABNORMAL LOW (ref 0.7–4.0)
MCH: 33.3 pg (ref 26.0–34.0)
MCHC: 33.4 g/dL (ref 30.0–36.0)
MCV: 99.5 fL (ref 80.0–100.0)
Monocytes Absolute: 0.3 10*3/uL (ref 0.1–1.0)
Monocytes Relative: 2 %
Neutro Abs: 11.3 10*3/uL — ABNORMAL HIGH (ref 1.7–7.7)
Neutrophils Relative %: 95 %
Platelets: 82 10*3/uL — ABNORMAL LOW (ref 150–400)
RBC: 4.24 MIL/uL (ref 4.22–5.81)
RDW: 14.2 % (ref 11.5–15.5)
WBC: 11.9 10*3/uL — ABNORMAL HIGH (ref 4.0–10.5)
nRBC: 0 % (ref 0.0–0.2)

## 2019-03-16 LAB — D-DIMER, QUANTITATIVE: D-Dimer, Quant: >20 ug/mL-FEU — ABNORMAL HIGH (ref 0.00–0.50)

## 2019-03-16 LAB — FERRITIN: Ferritin: 2205 ng/mL — ABNORMAL HIGH (ref 24–336)

## 2019-03-16 LAB — C-REACTIVE PROTEIN: CRP: 10 mg/dL — ABNORMAL HIGH (ref <1.0)

## 2019-03-16 LAB — MAGNESIUM: Magnesium: 1.7 mg/dL (ref 1.7–2.4)

## 2019-03-16 LAB — CBG MONITORING, ED: Glucose-Capillary: 221 mg/dL — ABNORMAL HIGH (ref 70–99)

## 2019-03-16 MED ORDER — DEXAMETHASONE SODIUM PHOSPHATE 10 MG/ML IJ SOLN
6.0000 mg | INTRAMUSCULAR | Status: DC
  Administered 2019-03-17: 6 mg via INTRAVENOUS
  Filled 2019-03-16: qty 1

## 2019-03-16 MED ORDER — CLONAZEPAM 0.5 MG PO TABS: 0.2500 mg | ORAL_TABLET | Freq: Every day | ORAL | Status: DC

## 2019-03-16 MED ORDER — HALOPERIDOL LACTATE 5 MG/ML IJ SOLN
1.0000 mg | Freq: Four times a day (QID) | INTRAMUSCULAR | Status: DC | PRN
  Administered 2019-03-18: 1 mg via INTRAVENOUS
  Filled 2019-03-16: qty 1

## 2019-03-16 MED ORDER — QUETIAPINE FUMARATE 25 MG PO TABS
12.5000 mg | ORAL_TABLET | Freq: Every day | ORAL | Status: DC
  Administered 2019-03-16 – 2019-03-17 (×2): 12.5 mg via ORAL
  Filled 2019-03-16 (×2): qty 1

## 2019-03-16 MED ORDER — ENOXAPARIN SODIUM 60 MG/0.6ML ~~LOC~~ SOLN
60.0000 mg | Freq: Two times a day (BID) | SUBCUTANEOUS | Status: DC
  Administered 2019-03-17 – 2019-03-19 (×5): 60 mg via SUBCUTANEOUS
  Filled 2019-03-16 (×5): qty 0.6

## 2019-03-16 MED ORDER — ENOXAPARIN SODIUM 40 MG/0.4ML ~~LOC~~ SOLN
40.0000 mg | Freq: Two times a day (BID) | SUBCUTANEOUS | Status: DC
  Administered 2019-03-16: 13:00:00 40 mg via SUBCUTANEOUS
  Filled 2019-03-16: qty 0.4

## 2019-03-16 NOTE — ED Notes (Signed)
carelink here to transfer pt to [facility]

## 2019-03-16 NOTE — ED Notes (Signed)
Breakfast ordered 

## 2019-03-16 NOTE — ED Notes (Signed)
Pt resting.

## 2019-03-16 NOTE — ED Notes (Signed)
Attempted report to [facility], unsuccessful

## 2019-03-16 NOTE — ED Notes (Signed)
Pt initially on 15L NRB when this RN assessed pt, pt placed on venturi mask at 12L with saturations a 97%

## 2019-03-16 NOTE — ED Notes (Signed)
Pt transferred to recliner for comfort due to pt trying to get out of bed. Pt comfortable in chair

## 2019-03-16 NOTE — Consult Note (Signed)
Consultation Note Date: 03/16/2019   Patient Name: [name]  DOB: [birth year]  MRN: [id number]  Age / Sex: 78 y.o., male  PCP: Reubin Milan, MD Referring Physician: Flora Lipps, MD  Reason for Consultation: Establishing goals of care and Psychosocial/spiritual support  HPI/Patient Profile: 78 y.o. male  with past medical history of end stage COPD, CAD, CABG x 5, atrial fibrillation s/p cardioversion, dementia and anxiety who was admitted on 02/28/2019 with SOB.  He was found to have pneumonia and was COVID positive.  He had a recent hospital admission earlier in December for COPD and a syncopal episode.     Clinical Assessment and Goals of Care:  I have reviewed medical records including EPIC notes, labs and imaging, received report from bedside RN Lovena Le, and spoke on the phone with his son and HCPOA Dmitry Rutter to discuss diagnosis prognosis, [place], EOL wishes, disposition and options.  I introduced Palliative Medicine as specialized medical care for people living with serious illness. It focuses on providing relief from the symptoms and stress of a serious illness. The goal is to improve quality of life for both the patient and the family.  We discussed a brief life review of the patient. Mr. Dibartolo lives with his son Jayant who is his 24 hour care taker.  Mr. Pacey worked for [employer] but went out on disability years ago.  He has been battling dementia for some time.  His son reports that at home Mr. Dorer can walk but is wobbly.  He has been eating poorly recently.  He has developed urinary incontinence.  He sleeps most of the time. He has a very poor quality of life.  About 4 weeks ago the [place] discontinued his clonazepam suddenly.  Per Eddie Dibbles his dementia spiked and things "Really went sideways".  Dewyane has spoken to another neurologist and the clonazepam has been re-ordered for anxiety and REM disorder.    Haroldo stated that his father has been sun downing for about 1.5 years.  He is confused at baseline and frequently hallucinates that other people are in the room.  At home he often takes his 2-3L of oxygen off and then disassembles the tubing.  Hastings states his father's QOL is poor.  "I love my father very much.  I just wish the good [facility]ita Cliche would go ahead and take him".  We discussed a best case scenario of improving and discharging to SNF.  He is unable to care for him at home any longer.  Sebatian stated that he has already accepted the worst case scenario.  He has talked with his 1/2 siblings Candy and [name] about this as well.  We discussed the possible long term effects of COVID including death, worsened COPD, dementia that is much more advanced (not eating/drinking).  Kawai understands.  "I'm living it."  Sir was very appreciative of the phone call and we closed by opting to take it one day at a time to see where things go.   Primary Decision Maker:  NEXT OF KIN  Son Dehaven Merrell is HCPOA and 24 hour care taker.    SUMMARY OF RECOMMENDATIONS    Son wants him well cared for and treated however, he does not want any extreme measures and he is understanding of the possible outcomes.  Code Status/Advance Care Planning:  DNR   Symptom Management:   Will add back Klonopin at 1/2 of home dose QHS.  Additional Recommendations (Limitations, Scope, Preferences):  Treat the treatable with minimally invasive measures.  Palliative Prophylaxis:   Aspiration, Delirium Protocol and Frequent Pain Assessment  Psycho-social/Spiritual:  Desire for further Chaplaincy support:  Not discussed  Prognosis:   Unable to determine.  Patient is at high risk of further acute decline and death due to end stage COPD, COVID pneumonia and advanced dementia.  Discharge Planning: To Be Determined.   Son is unable to care for him at home.      Primary Diagnoses: Present on Admission: . Respiratory failure  ([place]) . COPD exacerbation ([place]) . Hyperlipidemia . Atrial fibrillation ([place]) . Pneumonia due to COVID-19 virus . COVID-19   I have reviewed the medical record, interviewed the patient and family, and examined the patient. The following aspects are pertinent.  Past Medical History:  Diagnosis Date  . Acute kidney injury ([place]) 05/10/2018  . Alcohol abuse   . Allergic rhinitis   . ANEMIA, B12 DEFICIENCY 12/02/2008  . Arthritis   . CAD (coronary artery disease)    a.  s/p CABG 2011.  [place] Kitchen CAP (community acquired pneumonia) 04/03/2014  . CAROTID ARTERY STENOSIS 04/16/2009  . Cataract   . Chronic diastolic CHF (congestive heart failure) ([place])   . Chronic respiratory failure ([place])   . COLONIC POLYPS 05/21/2007  . Compression fracture of L1 lumbar vertebra (HCC)   . COPD with acute exacerbation ([place]) 06/10/2016  . COPD, severe ([place])   . Diabetes mellitus without complication ([place])   . DIVERTICULAR DISEASE 05/21/2007  . Dysrhythmia   . Essential hypertension   . Falls   . GERD (gastroesophageal reflux disease)   . GI BLEEDING 09/09/2008  . HCAP (healthcare-associated pneumonia)   . Hemoptysis   . HIATAL HERNIA 05/21/2007  . History of kidney stones   . HYPERLIPIDEMIA 11/21/2006  . Hypertension 08/23/2014  . Internal hemorrhoids   . Low back pain 08/23/2014  . MYOCARDIAL INFARCTION, HX OF 11/21/2006  . NEPHROLITHIASIS 05/21/2007  . Normocytic anemia   . NSVT (nonsustained ventricular tachycardia) ([place])    a. Holter 2015: NSR, A. Fib/flutter, short runs of NSVT  . Olecranon bursitis 07/09/2014   Required surgery and 6 day hospitalization. Some chronic pain at L elbow.    . OSTEOPOROSIS 11/21/2006  . Osteoporosis   . Paroxysmal atrial fibrillation ([place])    a. Anticoag stopped due to falls, rectal bleeding.  . Paroxysmal atrial flutter ([place])   . Pneumonia   . Pulmonary nodule    a. Dx 01/2015 -> advised to f/u PCP for PET.  . Pulmonary nodules    No pulmonary nodules on CTA Chest 07/2017  .  PVD (peripheral vascular disease) ([place])    a. s/p prior iliac stenting.  . Syncope 08/23/2014   Social History   Socioeconomic History  . Marital status: Widowed    Spouse name: Not on file  . Number of children: Not on file  . Years of education: Not on file  . Highest education level: Not on file  Occupational History  . Occupation: retired from [employer] and [employer]  Use  . Smoking status: Former Smoker    Packs/day: 3.00    Years: 50.00    Pack years: 150.00    Types: Cigarettes    Quit date: 09/25/2009    Years since quitting: 9.4  . Smokeless tobacco: Never Used  Substance and Sexual Activity  . Alcohol use: Yes    Alcohol/week: 6.0 standard drinks    Types: 6 Standard drinks or equivalent per week    Comment: [place] 2-3 a night  . Drug use: No  . Sexual activity: Not on file  Other Topics Concern  . Not on file  Social History Narrative   Widower in 2009. 3 kids. 4 grandkids. No greatgrandkids. No pets.    Lives with son Joshuwa Mes[place].    ADLS independent. Son Tison helps with finances.       Advance-does not want resuscitation.    HCPOA-no      Retired from [employer] and [employer].       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 Difficulty of Paying Living Expenses: Not on file  Food Insecurity:   . Worried About Charity fundraiser in the Last Year: Not on file  . Ran Out of Food in the Last Year: Not on file  Transportation Needs:   . Lack of Transportation (Medical): Not on file  . Lack of Transportation (Non-Medical): Not on file  Physical Activity:   . Days of Exercise per Week: Not on file  . Minutes of Exercise per Session: Not on file  Stress:   . Feeling of Stress : Not on file  Social Connections:   . Frequency of Communication with Friends and Family: Not on file  . Frequency of Social Gatherings with Friends and Family: Not on file  . Attends Religious  Services: Not on file  . Active Member of Clubs or Organizations: Not on file  . Attends Archivist Meetings: Not on file  . Marital Status: Not on file   Family History  Problem Relation Age of Onset  . Arthritis Mother        rheumatoid  . Heart attack Father   . Heart disease Father   . Heart disease Brother   . Colon cancer Brother 81  . Heart disease Brother   . Stomach cancer Neg Hx    Scheduled Meds: . atorvastatin  40 mg Oral QHS  . clonazepam  0.25 mg Oral QHS  . clonazePAM  0.25 mg Oral QHS  . diltiazem  180 mg Oral q morning - 10a  . enoxaparin (LOVENOX) injection  40 mg Subcutaneous Q24H  . famotidine  40 mg Oral QHS  . furosemide  20 mg Oral q morning - 10a  . ipratropium  2 puff Inhalation Q4H  . memantine  20 mg Oral QHS  . methylPREDNISolone (SOLU-MEDROL) injection  60 mg Intravenous Q8H  . mometasone-formoterol  1 puff Inhalation BID  . pantoprazole  40 mg Oral BID   Continuous Infusions: . ceFEPime (MAXIPIME) IV Stopped (03/16/19 0951)  . remdesivir 100 mg in NS 100 mL     PRN Meds:.acetaminophen **OR** acetaminophen, albuterol, guaiFENesin-dextromethorphan, ondansetron **OR** ondansetron (ZOFRAN) IV, senna-docusate Allergies  Allergen Reactions  . Ticlopidine Hcl Swelling  . Lorazepam Other (See Comments)    Altered mental status from 2 doses of IV Ativan on 05/10/14 HALLUCINATIONS AND DELIRIOUS  . Other Other (See Comments)  Narcotics: Hallucinations and paranoia  . Prednisone Other (See Comments)    NOT TRUE ALLERGY - CONFUSION     Vital Signs: BP 132/85 (BP Location: Left Arm)   Pulse 75   Temp 97.6 F (36.4 C) (Axillary)   Resp (!) 27   Ht 5\' 5"  (1.651 m)   Wt 60.8 kg   SpO2 99%   BMI 22.31 kg/m  Pain Scale: PAINAD   Pain Score: Asleep   SpO2: SpO2: 99 % O2 Device:SpO2: 99 % O2 Flow Rate: .O2 Flow Rate (L/min): 15 L/min  IO: Intake/output summary:   Intake/Output Summary (Last 24 hours) at 03/16/2019 1030 Last  data filed at 03/16/2019 0951 Gross per 24 hour  Intake 450 ml  Output -  Net 450 ml    LBM:   Baseline Weight: Weight: 60.8 kg Most recent weight: Weight: 60.8 kg     Palliative Assessment/Data: 20%     Time In: 9:00 Time Out: 10:00 Time Total: 60 min Visit consisted of counseling and education dealing with the complex and emotionally intense issues surrounding the need for palliative care and symptom management in the setting of serious and potentially life-threatening illness. Greater than 50%  of this time was spent counseling and coordinating care related to the above assessment and plan.  Signed by: Florentina Jenny, PA-C Palliative Medicine Pager: [phone]  Please contact Palliative Medicine Team phone at [phone] for questions and concerns.  For individual provider: See Shea Evans

## 2019-03-16 NOTE — ED Notes (Signed)
Pt presents with audible wheezing and hypoxia at this time. Oxygen therapy via NRB at flow rate of 15 lpm applied to maintain SPO2 94% or greater. PRN  Albuterol inhaler administered. Pt currently 98% NRB 15/L.

## 2019-03-16 NOTE — ED Notes (Signed)
Pt transferred to hospital bed, mittens removed, pt placed on 8L [place] while eating, this RN fed pt a few bites of oatmeal and 3 bites of sausage, pt now resting more comfortably, not trying to take leads and oxygen off.

## 2019-03-16 NOTE — ED Notes (Signed)
Pt son updated to inform him that pt went to [facility]

## 2019-03-16 NOTE — ED Notes (Signed)
MD at bedside. 

## 2019-03-16 NOTE — Progress Notes (Signed)
ANTICOAGULATION CONSULT NOTE - Initial Consult  Pharmacy Consult for Lovenox Indication: Rule out PE  Allergies  Allergen Reactions  . Ticlopidine Hcl Swelling  . Lorazepam Other (See Comments)    Altered mental status from 2 doses of IV Ativan on 05/10/14 HALLUCINATIONS AND DELIRIOUS  . Other Other (See Comments)    Narcotics: Hallucinations and paranoia  . Prednisone Other (See Comments)    NOT TRUE ALLERGY - CONFUSION    Patient Measurements: Height: 5\' 5"  (165.1 cm) Weight: 134 lb 0.6 oz (60.8 kg) IBW/kg (Calculated) : 61.5  Vital Signs: Temp: 97.7 F (36.5 C) (12/19 1654) Temp Source: Axillary (12/19 1654) BP: 148/76 (12/19 1654) Pulse Rate: 93 (12/19 1654)  Labs: Recent Labs    02/26/2019 1620 03/17/2019 1816 03/16/2019 2044 03/15/19 0353 03/16/19 0344  HGB 14.8 13.6  --  14.0 14.1  HCT 45.2 40.0  --  43.4 42.2  PLT 90*  --   --  87* 82*  CREATININE 1.42*  --   --  1.42* 1.19  TROPONINIHS 23*  --  19*  --   --     Estimated Creatinine Clearance: 44 mL/min (by C-G formula based on SCr of 1.19 mg/dL).   Medical History: Past Medical History:  Diagnosis Date  . Acute kidney injury ([place]) 05/10/2018  . Alcohol abuse   . Allergic rhinitis   . ANEMIA, B12 DEFICIENCY 12/02/2008  . Arthritis   . CAD (coronary artery disease)    a.  s/p CABG 2011.  Marland Kitchen CAP (community acquired pneumonia) 04/03/2014  . CAROTID ARTERY STENOSIS 04/16/2009  . Cataract   . Chronic diastolic CHF (congestive heart failure) ([place])   . Chronic respiratory failure ([place])   . COLONIC POLYPS 05/21/2007  . Compression fracture of L1 lumbar vertebra (HCC)   . COPD with acute exacerbation ([place]) 06/10/2016  . COPD, severe ([place])   . Diabetes mellitus without complication ([place])   . DIVERTICULAR DISEASE 05/21/2007  . Dysrhythmia   . Essential hypertension   . Falls   . GERD (gastroesophageal reflux disease)   . GI BLEEDING 09/09/2008  . HCAP (healthcare-associated pneumonia)   . Hemoptysis   . HIATAL  HERNIA 05/21/2007  . History of kidney stones   . HYPERLIPIDEMIA 11/21/2006  . Hypertension 08/23/2014  . Internal hemorrhoids   . Low back pain 08/23/2014  . MYOCARDIAL INFARCTION, HX OF 11/21/2006  . NEPHROLITHIASIS 05/21/2007  . Normocytic anemia   . NSVT (nonsustained ventricular tachycardia) ([place])    a. Holter 2015: NSR, A. Fib/flutter, short runs of NSVT  . Olecranon bursitis 07/09/2014   Required surgery and 6 day hospitalization. Some chronic pain at L elbow.    . OSTEOPOROSIS 11/21/2006  . Osteoporosis   . Paroxysmal atrial fibrillation ([place])    a. Anticoag stopped due to falls, rectal bleeding.  . Paroxysmal atrial flutter ([place])   . Pneumonia   . Pulmonary nodule    a. Dx 01/2015 -> advised to f/u PCP for PET.  . Pulmonary nodules    No pulmonary nodules on CTA Chest 07/2017  . PVD (peripheral vascular disease) ([place])    a. s/p prior iliac stenting.  . Syncope 08/23/2014    Medications:  Scheduled:  . atorvastatin  40 mg Oral QHS  . clonazepam  0.25 mg Oral QHS  . [START ON 03/17/2019] dexamethasone (DECADRON) injection  6 mg Intravenous Q24H  . diltiazem  180 mg Oral q morning - 10a  . enoxaparin (LOVENOX) injection  60 mg Subcutaneous Q12H  .  famotidine  40 mg Oral QHS  . furosemide  20 mg Oral q morning - 10a  . ipratropium  2 puff Inhalation Q4H  . memantine  20 mg Oral QHS  . methylPREDNISolone (SOLU-MEDROL) injection  60 mg Intravenous Q8H  . mometasone-formoterol  1 puff Inhalation BID  . pantoprazole  40 mg Oral BID   Infusions:  . ceFEPime (MAXIPIME) IV Stopped (03/16/19 0951)  . remdesivir 100 mg in NS 100 mL 100 mg (03/16/19 1249)   PRN: [DISCONTINUED] acetaminophen **OR** acetaminophen, albuterol, guaiFENesin-dextromethorphan, ondansetron **OR** ondansetron (ZOFRAN) IV, senna-docusate  Assessment: 78 yo male with history of Afib not on anticoagulation, severe COPD and COVID-19 pneumonia.  Pharmacy consulted to increase Lovenox to full-dose for clinically  suspected PE with rising d-dimer, now > 20.    Of note, platelets currently 82k, 90k on admit prior to starting prophylactic Lovenox.. H/H wnl, CrCl 44 ml/min.  Goal of Therapy:  Anti-Xa level 0.6-1 units/ml 4hrs after LMWH dose given Monitor platelets by anticoagulation protocol: Yes   Plan:   Increase Lovenox to 60mg  SQ q12h  Monitor renal function, CBC, signs/symptoms of bleeding  Peggyann Juba, PharmD, BCPS Pharmacy: [phone] 03/16/2019,5:41 PM

## 2019-03-16 NOTE — Plan of Care (Signed)
  Problem: Education: Goal: Knowledge of General Education information will improve Description: Including pain rating scale, medication(s)/side effects and non-pharmacologic comfort measures Outcome: Not Met (add Reason)   Problem: Health Behavior/Discharge Planning: Goal: Ability to manage health-related needs will improve Outcome: Not Met (add Reason)   Problem: Clinical Measurements: Goal: Ability to maintain clinical measurements within normal limits will improve Outcome: Not Met (add Reason) Goal: Will remain free from infection Outcome: Not Met (add Reason) Goal: Diagnostic test results will improve Outcome: Not Met (add Reason) Goal: Respiratory complications will improve Outcome: Not Met (add Reason) Goal: Cardiovascular complication will be avoided Outcome: Not Met (add Reason)   Problem: Activity: Goal: Risk for activity intolerance will decrease Outcome: Not Met (add Reason)   Problem: Nutrition: Goal: Adequate nutrition will be maintained Outcome: Not Met (add Reason)   Problem: Coping: Goal: Level of anxiety will decrease Outcome: Not Met (add Reason)   Problem: Elimination: Goal: Will not experience complications related to bowel motility Outcome: Not Met (add Reason) Goal: Will not experience complications related to urinary retention Outcome: Not Met (add Reason)   Problem: Pain Managment: Goal: General experience of comfort will improve Outcome: Not Met (add Reason)   Problem: Safety: Goal: Ability to remain free from injury will improve Outcome: Not Met (add Reason)   Problem: Skin Integrity: Goal: Risk for impaired skin integrity will decrease Outcome: Not Met (add Reason)   Problem: Education: Goal: Knowledge of risk factors and measures for prevention of condition will improve Outcome: Not Met (add Reason)   Problem: Coping: Goal: Psychosocial and spiritual needs will be supported Outcome: Not Met (add Reason)   Problem:  Respiratory: Goal: Will maintain a patent airway Outcome: Not Met (add Reason) Goal: Complications related to the disease process, condition or treatment will be avoided or minimized Outcome: Not Met (add Reason)

## 2019-03-16 NOTE — Progress Notes (Signed)
PROGRESS NOTE  [name] [id number] DOB: 1940/11/30 DOA: 03/22/2019 PCP: Jeffery Milan, MD   LOS: 2 days   Brief narrative:   [name] is a 78 y.o. male with medical history significant of chronic hypoxic respiratory failure on 2 to 4 L of oxygen at home, COPD, hypertension, coronary disease status post CABG, hyperlipidemia, paroxysmal A. fib, recent admission and discharge around 2 weeks ago for COPD exacerbation/pneumonia presented to the hospital with worsening shortness of breath.  Patient is a poor historian.  Patient stated that he has been more short of breath for a week.  He states that he went to a party more than a week ago and few days later become more short of breath with chest tightness, nonproductive cough, increased work of breathing.  Denies any contact with any Covid patients.  No fever, nausea or vomiting reported no abdominal pain, diarrhea, dysuria, leg swelling, loss of consciousness, seizures.  ED Course: Patient was placed on a nonrebreather per EMS and he received Solu-Medrol and magnesium.  In the ED, chest x-ray showed possibility of pneumonia.  He was started on intravenous antibiotics.  Hospitalist service was called to evaluate the patient.  Patient subsequently tested COVID-19 positive.  Assessment/Plan:  Principal Problem:   Pneumonia due to COVID-19 virus Active Problems:   Hyperlipidemia   Atrial fibrillation (HCC)   COPD exacerbation (HCC)   Acute on chronic respiratory failure with hypoxia (HCC)   Diabetes mellitus type II, controlled ([place])   Respiratory failure ([place])   COVID-19   Acute on chronic hypoxic respiratory failure secondary to Covid pneumonia with  COPD exacerbation. Patient uses 2 to 4 L oxygen at home.  Currently on NRB mask at 15 litres/min. Wean off as able. Chest x-ray shows new subtle pulmonary opacities within the left mid lung.  Cefepime and vancomycin was initiated but will discontinue vancomycin at this time.   Urinary  strep antigen was negative.  Legionella pending. Follow inflammatory markers shows significant trend up of D-dimer and trending up ferritin.  Slightly decreased CRP.  On IV Solu-Medrol and remdesivir.  Continue inhalers.  If patient has worsening hypoxia might need to consider Actemra/CT scan of the chest to rule out PE.  Significant elevation in D-dimer noted with persistently high ferritin levels.   COVID-19 Labs  Recent Labs    03/04/2019 1620 03/15/19 0353 03/16/19 0344  DDIMER 2.23*  --  >20.00*  FERRITIN 1,903* 2,232* 2,205*  LDH 291* 306*  --   CRP 17.3* 16.2* 10.0*    Paroxysmal A. Fib  Currently rate controlled.  Continue Cardizem.  Not on anticoagulation as an outpatient  Chronic diastolic heart failure -Patient to be compensated at this time..  No evidence of fluid overload.    ED echo from 08/06/2017 showed EF of 60 to 65%.  Strict input and output and daily weights.  Continue oral Lasix.  Mildly elevated troponin -Likely nonspecific.  No chest pain.  Troponin 23-19.   EKG showed no ST elevations or depressions.  Dementia/anxiety continue [place].  resumed Klonopin at half dose from home dose at home to avoid withdrawal.  GERD -Continue Protonix  Hyperlipidemia -Continue statin  Generalized deconditioning -Overall prognosis is guarded to poor.  Palliative care consulted.  Will need PT eval once respiratory status stabilizes.  VTE Prophylaxis: Lovenox  Code Status: DNR  Family Communication:  None today. I had called the patient's son Mr [name] yesterday and updated him about the clinical condition of the patient  including  potential clinical deterioration on the background of COPD and oxygen dependence.  Patient's son has acknowledged the potential outcome.  Disposition Plan: Inpatient.  Transfer to [facility] today when bed is stable.Marland Kitchen Pending transfer at this time  Consultants:  None  Procedures:  None  Antibiotics: Anti-infectives (From  admission, onward)   Start     Dose/Rate Route Frequency Ordered Stop   03/16/19 1000  remdesivir 100 mg in sodium chloride 0.9 % 100 mL IVPB     100 mg 200 mL/hr over 30 Minutes Intravenous Daily 03/15/19 0813 03/20/19 0959   03/15/19 1800  vancomycin (VANCOREADY) IVPB 750 mg/150 mL  Status:  Discontinued     750 mg 150 mL/hr over 60 Minutes Intravenous Every 24 hours 03/11/2019 1723 03/15/19 0903   03/15/19 0900  remdesivir 200 mg in sodium chloride 0.9% 250 mL IVPB     200 mg 580 mL/hr over 30 Minutes Intravenous Once 03/15/19 0813 03/15/19 1112   03/15/19 0700  ceFEPIme (MAXIPIME) 2 g in sodium chloride 0.9 % 100 mL IVPB     2 g 200 mL/hr over 30 Minutes Intravenous Every 12 hours 03/26/2019 1829     03/02/2019 1715  vancomycin (VANCOREADY) IVPB 1250 mg/250 mL     1,250 mg 166.7 mL/hr over 90 Minutes Intravenous  Once 03/03/2019 1712 03/18/2019 2114   03/20/2019 1645  ceFEPIme (MAXIPIME) 2 g in sodium chloride 0.9 % 100 mL IVPB     2 g 200 mL/hr over 30 Minutes Intravenous  Once 03/20/2019 1640 03/03/2019 2000      Subjective: Today, patient was put on nonrebreather mask at 15 L/min due to worsening hypoxia but denied increasing shortness of breath chest pain my exam. Nursing Staff reported that the patient was subsequently changed to nasal cannula for eating..  Denies nausea vomiting or abdominal pain.   Objective: Vitals:   03/16/19 0645 03/16/19 0729  BP: 122/81 132/85  Pulse: (!) 111 75  Resp: (!) 22 (!) 27  Temp:  97.6 F (36.4 C)  SpO2: 100% 99%    Intake/Output Summary (Last 24 hours) at 03/16/2019 [id number] Last data filed at 03/15/2019 2113 Gross per 24 hour  Intake 450 ml  Output --  Net 450 ml   Filed Weights   03/27/2019 1946  Weight: 60.8 kg   Body mass index is 22.31 kg/m.   Physical Exam: General: Alert awake communicative.  Dyspneic.  Was on nasal cannula for eating at the time of my evaluation at 6 L/min. HENT: Normocephalic, pupils equally reacting to light and  accommodation.  No scleral pallor or icterus noted. Oral mucosa is moist.  Chest: Coarse breath sounds noted, diminished breath sounds.  CVS: S1 &S2 heard. No murmur.  Regular rate and rhythm. Abdomen: Soft, nontender, nondistended.  Bowel sounds are heard.  Extremities: No cyanosis, clubbing or edema.  Peripheral pulses are palpable. Psych: Alert, awake and communicative.  Mildly anxious. CNS:  No cranial nerve deficits.  Moving all extremities. Skin: Warm and dry.  No rashes noted.   Data Review: I have personally reviewed the following laboratory data and studies,  CBC: Recent Labs  Lab 03/28/2019 1620 03/18/2019 1816 03/15/19 0353 03/16/19 0344  WBC 6.6  --  3.2* 11.9*  NEUTROABS 6.1  --   --  11.3*  HGB 14.8 13.6 14.0 14.1  HCT 45.2 40.0 43.4 42.2  MCV 101.6*  --  100.9* 99.5  PLT 90*  --  87* 82*   Basic Metabolic Panel: Recent Labs  Lab 03/20/2019 1620 03/27/2019 1816 03/15/19 0353 03/16/19 0344  NA 140 138 140 142  K 4.5 4.5 4.2 4.3  CL 101  --  103 105  CO2 25  --  23 23  GLUCOSE 150*  --  259* 252*  BUN 20  --  30* 34*  CREATININE 1.42*  --  1.42* 1.19  CALCIUM 8.4*  --  8.3* 8.4*  MG  --   --   --  1.7   Liver Function Tests: Recent Labs  Lab 03/07/2019 1620 03/15/19 0353 03/16/19 0344  AST 30 29 29   ALT 24 25 24   ALKPHOS 43 42 49  BILITOT 1.2 1.0 0.9  PROT 6.2* 5.6* 5.7*  ALBUMIN 2.7* 2.5* 2.6*   No results for input(s): LIPASE, AMYLASE in the last 168 hours. No results for input(s): AMMONIA in the last 168 hours. Cardiac Enzymes: No results for input(s): CKTOTAL, CKMB, CKMBINDEX, TROPONINI in the last 168 hours. BNP (last 3 results) Recent Labs    02/25/19 0954 03/01/19 0248 03/25/2019 1620  BNP 205.7* 1,146.2* 82.6    ProBNP (last 3 results) No results for input(s): PROBNP in the last 8760 hours.  CBG: Recent Labs  Lab 03/16/19 0724  GLUCAP 221*   Recent Results (from the past 240 hour(s))  Blood Culture (routine x 2)     Status: None  (Preliminary result)   Collection Time: 03/11/2019  4:00 PM   Specimen: BLOOD RIGHT ARM  Result Value Ref Range Status   Specimen Description BLOOD RIGHT ARM  Final   Special Requests   Final    BOTTLES DRAWN AEROBIC AND ANAEROBIC Blood Culture adequate volume   Culture   Final    NO GROWTH < 24 HOURS Performed at [facility], [place] [address] [place], [place] [address]    Report Status PENDING  Incomplete  Blood Culture (routine x 2)     Status: None (Preliminary result)   Collection Time: 03/07/2019  4:10 PM   Specimen: BLOOD RIGHT HAND  Result Value Ref Range Status   Specimen Description BLOOD RIGHT HAND  Final   Special Requests   Final    BOTTLES DRAWN AEROBIC ONLY Blood Culture results may not be optimal due to an inadequate volume of blood received in culture bottles   Culture   Final    NO GROWTH < 24 HOURS Performed at [facility], [place] [address] [place], [place] [address]    Report Status PENDING  Incomplete  Respiratory Panel by PCR     Status: None   Collection Time: 03/01/2019 10:06 PM   Specimen: Nasopharyngeal Swab; Respiratory  Result Value Ref Range Status   Adenovirus NOT DETECTED NOT DETECTED Final   Coronavirus 229E NOT DETECTED NOT DETECTED Final    Comment: (NOTE) The Coronavirus on the Respiratory Panel, DOES NOT test for the novel  Coronavirus (2019 nCoV)    Coronavirus HKU1 NOT DETECTED NOT DETECTED Final   Coronavirus NL63 NOT DETECTED NOT DETECTED Final   Coronavirus OC43 NOT DETECTED NOT DETECTED Final   Metapneumovirus NOT DETECTED NOT DETECTED Final   Rhinovirus / Enterovirus NOT DETECTED NOT DETECTED Final   Influenza A NOT DETECTED NOT DETECTED Final   Influenza B NOT DETECTED NOT DETECTED Final   Parainfluenza Virus 1 NOT DETECTED NOT DETECTED Final   Parainfluenza Virus 2 NOT DETECTED NOT DETECTED Final   Parainfluenza Virus 3 NOT DETECTED NOT DETECTED Final   Parainfluenza Virus 4 NOT DETECTED NOT DETECTED  Final   Respiratory  Syncytial Virus NOT DETECTED NOT DETECTED Final   Bordetella pertussis NOT DETECTED NOT DETECTED Final   Chlamydophila pneumoniae NOT DETECTED NOT DETECTED Final   Mycoplasma pneumoniae NOT DETECTED NOT DETECTED Final    Comment: Performed at [facility], [place] [address], [place], [place] [address]  Surgical pcr screen     Status: None   Collection Time: 03/28/2019 10:06 PM   Specimen: Nasopharyngeal Swab; Nasal Swab  Result Value Ref Range Status   MRSA, PCR NEGATIVE NEGATIVE Final   Staphylococcus aureus NEGATIVE NEGATIVE Final    Comment: (NOTE) The Xpert SA Assay (FDA approved for NASAL specimens in patients 69 years of age and older), is one component of a comprehensive surveillance program. It is not intended to diagnose infection nor to guide or monitor treatment. Performed at [facility], [place] [address], [place], [place] [address]      Studies: DG Chest Port 1 View  Result Date: 03/20/2019 CLINICAL DATA:  Shortness of breath. Additional provided: Shortness of breath, patient denies fever or cough. History of dementia. EXAM: PORTABLE CHEST 1 VIEW COMPARISON:  Chest radiograph 07/16/2018 FINDINGS: Mild cardiomegaly, unchanged. Sequela of prior median sternotomy/CABG. Airspace opacity at the right lung base has increased since prior examination 03/01/2019. New subtle pulmonary opacities also questioned within the left midlung. No sizable pleural effusion or evidence of pneumothorax. No acute bony abnormality. IMPRESSION: Interval increase in right basilar airspace opacity since prior exam 03/01/2019. New subtle pulmonary opacities also questioned within the left mid lung. Findings are nonspecific but most suspicious for pneumonia. Clinical correlation and radiographic follow-up to resolution recommended. Cardiomegaly status post CABG. Electronically Signed   By: Kellie Simmering DO   On: 03/13/2019 16:05    Scheduled Meds: . atorvastatin  40 mg Oral QHS  . clonazepam  0.25  mg Oral QHS  . diltiazem  180 mg Oral q morning - 10a  . enoxaparin (LOVENOX) injection  40 mg Subcutaneous Q24H  . famotidine  40 mg Oral QHS  . furosemide  20 mg Oral q morning - 10a  . ipratropium  2 puff Inhalation Q4H  . memantine  20 mg Oral QHS  . methylPREDNISolone (SOLU-MEDROL) injection  60 mg Intravenous Q8H  . mometasone-formoterol  1 puff Inhalation BID  . pantoprazole  40 mg Oral BID    Continuous Infusions: . ceFEPime (MAXIPIME) IV Stopped (03/15/19 2113)  . remdesivir 100 mg in NS 100 mL       Flora Lipps, MD  [facility] 03/16/2019

## 2019-03-16 NOTE — Progress Notes (Signed)
[name]  [id number] DOB: December 19, 1940 DOA: 03/16/2019 PCP: [name], [name]    Brief Narrative:  78 year old with a history of COPD with chronic hypoxic respiratory failure requiring 2-4 L of O2, HTN, CAD status post CABG, HLD, paroxysmal atrial fibrillation, and a recent admission/discharge 2 weeks prior due to pneumonia and a severe COPD exacerbation who returned to the hospital with worsening severe shortness of breath which have been building over a week's time.  In the ED a CXR raise concern for a pneumonia.  Significant Events:   COVID-19 specific Treatment: Remdesivir 12/18 > Solu-Medrol  Subjective: Patient was seen for follow-up visit.  Assessment & Plan:  Acute on chronic hypoxic respiratory failure - COVID Pneumonia - chronic severe COPD Chronically requires 2-4 L of oxygen support at home -continue empiric antibiotic therapy -continue steroid and remdesivir  Recent Labs  Lab 03/03/2019 1620 03/15/19 0353 03/16/19 0344  DDIMER 2.23*  --  >20.00*  FERRITIN 1,903* 2,232* 2,205*  CRP 17.3* 16.2* 10.0*  ALT 24 25 24   PROCALCITON 0.39  --   --     Markedly elevated D-dimer Empirically anticoagulate - high likelihood of pulmonary embolism  Paroxysmal A. Fib  rate controlled - continue Cardizem - not on anticoagulation as an outpatient  Chronic diastolic heart failure No evidence of fluid overload - TTE 08/06/2017 noted EF of 60-65%  Mildly elevated troponin Likely nonspecific - no chest pain  Dementia/anxiety continue namenda and klonopin   GERD Continue Protonix  Hyperlipidemia Continue statin  Generalized deconditioning Overall prognosis is guarded to poor - Palliative care consulted  DVT prophylaxis: full dose lovenox  Code Status: DNR - NO CODE Family Communication:  Disposition Plan:   Consultants:  none  Antimicrobials:  Cefepime 12/17 > Vancomycin 12/17  Objective: Blood pressure 138/79, pulse 87, temperature 97.6 F  (36.4 C), temperature source Axillary, resp. rate (!) 26, height 5\' 5"  (1.651 m), weight 60.8 kg, SpO2 95 %.  Intake/Output Summary (Last 24 hours) at 03/16/2019 1127 Last data filed at 03/16/2019 0951 Gross per 24 hour  Intake 200 ml  Output -  Net 200 ml   Filed Weights   03/02/2019 1946  Weight: 60.8 kg    Examination: Seen for follow-up   CBC: Recent Labs  Lab 02/27/2019 1620 03/20/2019 1816 03/15/19 0353 03/16/19 0344  WBC 6.6  --  3.2* 11.9*  NEUTROABS 6.1  --   --  11.3*  HGB 14.8 13.6 14.0 14.1  HCT 45.2 40.0 43.4 42.2  MCV 101.6*  --  100.9* 99.5  PLT 90*  --  87* 82*   Basic Metabolic Panel: Recent Labs  Lab 03/08/2019 1620 03/16/2019 1816 03/15/19 0353 03/16/19 0344  NA 140 138 140 142  K 4.5 4.5 4.2 4.3  CL 101  --  103 105  CO2 25  --  23 23  GLUCOSE 150*  --  259* 252*  BUN 20  --  30* 34*  CREATININE 1.42*  --  1.42* 1.19  CALCIUM 8.4*  --  8.3* 8.4*  MG  --   --   --  1.7   GFR: Estimated Creatinine Clearance: 44 mL/min (by C-G formula based on SCr of 1.19 mg/dL).  Liver Function Tests: Recent Labs  Lab 03/24/2019 1620 03/15/19 0353 03/16/19 0344  AST 30 29 29   ALT 24 25 24   ALKPHOS 43 42 49  BILITOT 1.2 1.0 0.9  PROT 6.2* 5.6* 5.7*  ALBUMIN 2.7* 2.5* 2.6*    HbA1C: Hgb  A1c MFr Bld  Date/Time Value Ref Range Status  02/25/2019 03:30 PM 6.7 (H) 4.8 - 5.6 % Final    Comment:    (NOTE) Pre diabetes:          5.7%-6.4% Diabetes:              >6.4% Glycemic control for   <7.0% adults with diabetes   05/10/2018 03:47 PM 7.6 (H) 4.8 - 5.6 % Final    Comment:    (NOTE)         Prediabetes: 5.7 - 6.4         Diabetes: >6.4         Glycemic control for adults with diabetes: <7.0     CBG: Recent Labs  Lab 03/16/19 0724  GLUCAP 221*    Recent Results (from the past 240 hour(s))  Blood Culture (routine x 2)     Status: None (Preliminary result)   Collection Time: 03/01/2019  4:00 PM   Specimen: BLOOD RIGHT ARM  Result Value Ref  Range Status   Specimen Description BLOOD RIGHT ARM  Final   Special Requests   Final    BOTTLES DRAWN AEROBIC AND ANAEROBIC Blood Culture adequate volume   Culture   Final    NO GROWTH < 24 HOURS Performed at [facility], [address] [place], [place] [address]    Report Status PENDING  Incomplete  Blood Culture (routine x 2)     Status: None (Preliminary result)   Collection Time: 03/23/2019  4:10 PM   Specimen: BLOOD RIGHT HAND  Result Value Ref Range Status   Specimen Description BLOOD RIGHT HAND  Final   Special Requests   Final    BOTTLES DRAWN AEROBIC ONLY Blood Culture results may not be optimal due to an inadequate volume of blood received in culture bottles   Culture   Final    NO GROWTH < 24 HOURS Performed at [facility], [place] [address], [place], [place] [address]    Report Status PENDING  Incomplete  Respiratory Panel by PCR     Status: None   Collection Time: 03/24/2019 10:06 PM   Specimen: Nasopharyngeal Swab; Respiratory  Result Value Ref Range Status   Adenovirus NOT DETECTED NOT DETECTED Final   Coronavirus 229E NOT DETECTED NOT DETECTED Final    Comment: (NOTE) The Coronavirus on the Respiratory Panel, DOES NOT test for the novel  Coronavirus (2019 nCoV)    Coronavirus HKU1 NOT DETECTED NOT DETECTED Final   Coronavirus NL63 NOT DETECTED NOT DETECTED Final   Coronavirus OC43 NOT DETECTED NOT DETECTED Final   Metapneumovirus NOT DETECTED NOT DETECTED Final   Rhinovirus / Enterovirus NOT DETECTED NOT DETECTED Final   Influenza A NOT DETECTED NOT DETECTED Final   Influenza B NOT DETECTED NOT DETECTED Final   Parainfluenza Virus 1 NOT DETECTED NOT DETECTED Final   Parainfluenza Virus 2 NOT DETECTED NOT DETECTED Final   Parainfluenza Virus 3 NOT DETECTED NOT DETECTED Final   Parainfluenza Virus 4 NOT DETECTED NOT DETECTED Final   Respiratory Syncytial Virus NOT DETECTED NOT DETECTED Final   Bordetella pertussis NOT DETECTED NOT DETECTED Final    Chlamydophila pneumoniae NOT DETECTED NOT DETECTED Final   Mycoplasma pneumoniae NOT DETECTED NOT DETECTED Final    Comment: Performed at [facility], [place]. [address], [place], [place] [address]  Surgical pcr screen     Status: None   Collection Time: 03/09/2019 10:06 PM   Specimen:  Nasopharyngeal Swab; Nasal Swab  Result Value Ref Range Status   MRSA, PCR NEGATIVE NEGATIVE Final   Staphylococcus aureus NEGATIVE NEGATIVE Final    Comment: (NOTE) The Xpert SA Assay (FDA approved for NASAL specimens in patients 66 years of age and older), is one component of a comprehensive surveillance program. It is not intended to diagnose infection nor to guide or monitor treatment. Performed at [facility], [place] [address], [place], [place] [address]      Scheduled Meds: . atorvastatin  40 mg Oral QHS  . clonazepam  0.25 mg Oral QHS  . clonazePAM  0.25 mg Oral QHS  . diltiazem  180 mg Oral q morning - 10a  . enoxaparin (LOVENOX) injection  40 mg Subcutaneous Q24H  . famotidine  40 mg Oral QHS  . furosemide  20 mg Oral q morning - 10a  . ipratropium  2 puff Inhalation Q4H  . memantine  20 mg Oral QHS  . methylPREDNISolone (SOLU-MEDROL) injection  60 mg Intravenous Q8H  . mometasone-formoterol  1 puff Inhalation BID  . pantoprazole  40 mg Oral BID   Continuous Infusions: . ceFEPime (MAXIPIME) IV Stopped (03/16/19 0951)  . remdesivir 100 mg in NS 100 mL       LOS: 2 days   Jeffery Altes, [name] [facility] Office  [phone] Pager - Text Page per Amion  If 7PM-7AM, please contact night-coverage per Amion 03/16/2019, 11:27 AM

## 2019-03-17 LAB — CBC WITH DIFFERENTIAL/PLATELET
Abs Immature Granulocytes: 0.04 10*3/uL (ref 0.00–0.07)
Basophils Absolute: 0 10*3/uL (ref 0.0–0.1)
Basophils Relative: 0 %
Eosinophils Absolute: 0 10*3/uL (ref 0.0–0.5)
Eosinophils Relative: 0 %
HCT: 40.3 % (ref 39.0–52.0)
Hemoglobin: 13.2 g/dL (ref 13.0–17.0)
Immature Granulocytes: 0 %
Lymphocytes Relative: 3 %
Lymphs Abs: 0.2 10*3/uL — ABNORMAL LOW (ref 0.7–4.0)
MCH: 33.2 pg (ref 26.0–34.0)
MCHC: 32.8 g/dL (ref 30.0–36.0)
MCV: 101.3 fL — ABNORMAL HIGH (ref 80.0–100.0)
Monocytes Absolute: 0.2 10*3/uL (ref 0.1–1.0)
Monocytes Relative: 3 %
Neutro Abs: 9.3 10*3/uL — ABNORMAL HIGH (ref 1.7–7.7)
Neutrophils Relative %: 94 %
Platelets: 64 10*3/uL — ABNORMAL LOW (ref 150–400)
RBC: 3.98 MIL/uL — ABNORMAL LOW (ref 4.22–5.81)
RDW: 14.3 % (ref 11.5–15.5)
WBC: 9.8 10*3/uL (ref 4.0–10.5)
nRBC: 0 % (ref 0.0–0.2)

## 2019-03-17 LAB — COMPREHENSIVE METABOLIC PANEL
ALT: 26 U/L (ref 0–44)
AST: 27 U/L (ref 15–41)
Albumin: 2.6 g/dL — ABNORMAL LOW (ref 3.5–5.0)
Alkaline Phosphatase: 54 U/L (ref 38–126)
Anion gap: 13 (ref 5–15)
BUN: 40 mg/dL — ABNORMAL HIGH (ref 8–23)
CO2: 26 mmol/L (ref 22–32)
Calcium: 8.1 mg/dL — ABNORMAL LOW (ref 8.9–10.3)
Chloride: 103 mmol/L (ref 98–111)
Creatinine, Ser: 1.14 mg/dL (ref 0.61–1.24)
GFR calc Af Amer: >60 mL/min (ref >60)
GFR calc non Af Amer: >60 mL/min (ref >60)
Glucose, Bld: 312 mg/dL — ABNORMAL HIGH (ref 70–99)
Potassium: 3.9 mmol/L (ref 3.5–5.1)
Sodium: 142 mmol/L (ref 135–145)
Total Bilirubin: 0.9 mg/dL (ref 0.3–1.2)
Total Protein: 5.3 g/dL — ABNORMAL LOW (ref 6.5–8.1)

## 2019-03-17 LAB — GLUCOSE, CAPILLARY
Glucose-Capillary: 235 mg/dL — ABNORMAL HIGH (ref 70–99)
Glucose-Capillary: 227 mg/dL — ABNORMAL HIGH (ref 70–99)
Glucose-Capillary: 202 mg/dL — ABNORMAL HIGH (ref 70–99)

## 2019-03-17 LAB — MAGNESIUM: Magnesium: 1.7 mg/dL (ref 1.7–2.4)

## 2019-03-17 LAB — C-REACTIVE PROTEIN: CRP: 5.5 mg/dL — ABNORMAL HIGH (ref <1.0)

## 2019-03-17 LAB — FERRITIN: Ferritin: 1937 ng/mL — ABNORMAL HIGH (ref 24–336)

## 2019-03-17 LAB — D-DIMER, QUANTITATIVE: D-Dimer, Quant: >20 ug/mL-FEU — ABNORMAL HIGH (ref 0.00–0.50)

## 2019-03-17 MED ORDER — INSULIN ASPART 100 UNIT/ML ~~LOC~~ SOLN
0.0000 [IU] | Freq: Every day | SUBCUTANEOUS | Status: DC
  Administered 2019-03-17: 22:00:00 2 [IU] via SUBCUTANEOUS
  Administered 2019-03-18: 23:00:00 3 [IU] via SUBCUTANEOUS

## 2019-03-17 MED ORDER — ENSURE ENLIVE PO LIQD
237.0000 mL | Freq: Two times a day (BID) | ORAL | Status: DC
  Administered 2019-03-17: 237 mL via ORAL

## 2019-03-17 MED ORDER — ADULT MULTIVITAMIN W/MINERALS CH
1.0000 | ORAL_TABLET | Freq: Every day | ORAL | Status: DC
  Administered 2019-03-17: 1 via ORAL
  Filled 2019-03-17: qty 1

## 2019-03-17 MED ORDER — METHYLPREDNISOLONE SODIUM SUCC 125 MG IJ SOLR
60.0000 mg | Freq: Two times a day (BID) | INTRAMUSCULAR | Status: DC
  Administered 2019-03-17 – 2019-03-19 (×4): 60 mg via INTRAVENOUS
  Filled 2019-03-17 (×4): qty 2

## 2019-03-17 MED ORDER — INSULIN ASPART 100 UNIT/ML ~~LOC~~ SOLN
0.0000 [IU] | Freq: Three times a day (TID) | SUBCUTANEOUS | Status: DC
  Administered 2019-03-17 – 2019-03-18 (×3): 3 [IU] via SUBCUTANEOUS

## 2019-03-17 NOTE — Progress Notes (Signed)
Occupational Therapy Evaluation Patient Details Name: [name] MRN: [id number] DOB: 1940/06/22 Today's Date: 03/17/2019    History of Present Illness 78 year old with a history of COPD with chronic hypoxic respiratory failure requiring 2-4 L of O2, HTN, CAD status post CABG, HLD, paroxysmal atrial fibrillation, and a recent admission/discharge 2 weeks prior due to pneumonia and a severe COPD exacerbation who returned to the hospital with worsening severe shortness of breath which have been building over a week's time.  In the ED a CXR raise concern for a pneumonia.   Clinical Impression   PTA pt was living with his son who is his primary caregiver. Pt was requiring assist for ADLs and transfers, able to ambulate. Pt uses 2-3L of oxygen at home and currently requires 9L HFNC + 15L NRB at hospital. Pt currently requires max assist to total assist for all self-care and functional transfer tasks. Pt in bilateral wrist restraints upon arrival, restless and pulling at oxygen during session. Pt tolerated sitting edge of bed ~5 min with max assist. Pt requesting to lay back down due to fatigue. SpO2 maintained in 90s throughout on 9L HFNC + 15L NRB. Pt noted to be soiled upon laying back down, requiring total assist to get cleaned up from bed level. Pt demonstrated decreased ROM, strength, endurance, balance, sitting/standing tolerance, activity tolerance, and safety awareness impacting ability to complete self-care and functional transfer tasks. Recommend skilled OT services to address above deficits in order to promote function and prevent further decline. Recommend discharge to SNF with transition to LTC.     Follow Up Recommendations  SNF;Supervision/Assistance - 24 hour(transition to long-term care)    Equipment Recommendations  Other (comment)(TBD at next venue of care)    Recommendations for Other Services       Precautions / Restrictions Precautions Precautions: Fall;Other  (comment) Precaution Comments: Bilateral wrist restraints. 9L HFNC + 15L NRB. Hx of dementia. Sundowns. Incontinent of urine. Restrictions Weight Bearing Restrictions: No      Mobility Bed Mobility Overal bed mobility: Needs Assistance Bed Mobility: Rolling;Supine to Sit;Sit to Supine Rolling: Total assist   Supine to sit: Max assist;HOB elevated Sit to supine: Max assist;HOB elevated   General bed mobility comments: Assist for both trunk and BLEs. Pt able to assist with scooting towards EOB and back into bed.  Transfers                 General transfer comment: Not attempted this date due to safety concerns. Pt unable to follow one step instructions consistently. Pt restless and pulling at lines.    Balance Overall balance assessment: Needs assistance Sitting-balance support: Feet supported;Bilateral upper extremity supported Sitting balance-Leahy Scale: Poor       Standing balance-Leahy Scale: (Standing task not attempted this date due to safety concerns)                             ADL either performed or assessed with clinical judgement   ADL Overall ADL's : Needs assistance/impaired Eating/Feeding: Total assistance;Bed level   Grooming: Total assistance;Bed level   Upper Body Bathing: Bed level;Total assistance   Lower Body Bathing: +2 for physical assistance;Total assistance;Bed level   Upper Body Dressing : Total assistance;Bed level   Lower Body Dressing: Total assistance;Bed level;+2 for physical assistance   Toilet Transfer: Total assistance(bed level, incontinent of urine)   Toileting- Clothing Manipulation and Hygiene: Total assistance;+2 for physical assistance;Bed level  Functional mobility during ADLs: (Not attempted this date due to safety concerns. )       Vision         Perception     Praxis      Pertinent Vitals/Pain Pain Assessment: Faces Faces Pain Scale: Hurts a little bit Pain Intervention(s): Monitored  during session     Hand Dominance Right   Extremity/Trunk Assessment Upper Extremity Assessment Upper Extremity Assessment: Generalized weakness(Pt unable to follow AROM. Resistant to PROM)   Lower Extremity Assessment Lower Extremity Assessment: Defer to PT evaluation       Communication Communication Communication: Other (comment);HOH(Pt with mumbled, incoherent speech. )   Cognition Arousal/Alertness: Lethargic Behavior During Therapy: Restless(Attempting to pull off oxygen during session.) Overall Cognitive Status: History of cognitive impairments - at baseline                                 General Comments: Pt able to whisper name to therapist. Pt has dementia at baseline. Pt sundowns and has been having hallucinations per son's report.    General Comments  Pt tolerated sitting EOB ~5 min with max assist. SpO2 maintained in 90s throughout on 9L HFNC + 15L NRB. RR 18-25.  HR in 80s throughout.     Exercises     Shoulder Instructions      Home Living Family/patient expects to be discharged to:: Skilled nursing facility                                 Additional Comments: Pt was previously living with his son who was his 24/7 caregiver. Son unable to care for pt at home.       Prior Functioning/Environment Level of Independence: Needs assistance  Gait / Transfers Assistance Needed: Pt is a poor historian. PLOF obtained from chart. Per son, pt ambulates but is wobbly.  ADL's / Homemaking Assistance Needed: Pt's son is his primary caregiver, providing 24/7 assist with self-care, transfer, and mobility tasks.   Comments: Dementia at baseline. Pt sundowns and has hallucinations. Pt uses 2-3L oxygen at home, however often takes it off and disassembles it.  Per chart, pt spends majority of time sleeping.         OT Problem List: Decreased strength;Decreased range of motion;Decreased activity tolerance;Impaired balance (sitting and/or  standing);Decreased coordination;Decreased cognition;Decreased safety awareness;Decreased knowledge of use of DME or AE;Cardiopulmonary status limiting activity      OT Treatment/Interventions: Self-care/ADL training;Therapeutic exercise;Neuromuscular education;Energy conservation;DME and/or AE instruction;Therapeutic activities;Patient/family education;Balance training    OT Goals(Current goals can be found in the care plan section) Acute Rehab OT Goals Patient Stated Goal: Unable to state Time For Goal Achievement: 03/31/19 Potential to Achieve Goals: Fair ADL Goals Pt Will Perform Eating: with mod assist;sitting Pt Will Perform Grooming: with mod assist;sitting Pt Will Transfer to Toilet: with mod assist;bedside commode Additional ADL Goal #1: Pt to tolerate sitting edge of bed 10 min with supervision, in preparation for ADLs. Additional ADL Goal #2: Pt to tolerate standing up to 3 minutes with mod assist, in preparation for ADLs.  OT Frequency: Min 2X/week   Barriers to D/C:            Co-evaluation              AM-PAC OT "6 Clicks" Daily Activity     Outcome Measure Help from another person eating  meals?: Total Help from another person taking care of personal grooming?: Total Help from another person toileting, which includes using toliet, bedpan, or urinal?: Total Help from another person bathing (including washing, rinsing, drying)?: Total Help from another person to put on and taking off regular upper body clothing?: Total Help from another person to put on and taking off regular lower body clothing?: Total 6 Click Score: 6   End of Session Equipment Utilized During Treatment: Oxygen Nurse Communication: Mobility status  Activity Tolerance: Patient limited by fatigue;Patient limited by lethargy(Limited by confusion) Patient left: in bed;with call bell/phone within reach;with bed alarm set  OT Visit Diagnosis: Muscle weakness (generalized) (M62.81);Feeding  difficulties (R63.3);Unsteadiness on feet (R26.81)                Time: [id number] OT Time Calculation (min): 32 min Charges:  OT General Charges $OT Visit: 1 Visit OT Evaluation $OT Eval Moderate Complexity: 1 Mod OT Treatments $Therapeutic Activity: 8-22 mins  Mauri Brooklyn OTR/L [phone]   Mauri Brooklyn 03/17/2019, 12:45 PM

## 2019-03-17 NOTE — Progress Notes (Addendum)
[name]  [id number] DOB: [birth year] DOA: 03/18/2019 PCP: Reubin Milan, MD    Brief Narrative:  78yo with a history of COPD with chronic hypoxic respiratory failure requiring 2-4 L of O2, HTN, CAD status post CABG, HLD, paroxysmal atrial fibrillation, and a recent admission/discharge 2 weeks prior due to a severe COPD exacerbation and fluid overload who returned to the hospital with worsening severe SOB which has been building over a week's time.  In the ED a CXR raised concern for a pneumonia. He was found to be COVID+.   Significant Events: 11/30 > 12/8 COPD exac + fluid overload  12/17 admit via [place]  COVID-19 specific Treatment: Remdesivir 12/18 > Solu-Medrol 12/17 >   Date of Positive COVID Test: 03/22/2019 (was negative 02/25/2019)  Date Quarantine Ends:  Antimicrobials:  Cefepime 12/17 > Vancomycin 12/17  Subjective: Continues to have difficulty breathing. Is confused and at times agitated. Unable to provide a ROS due to acute delirium.   Assessment & Plan:  Acute on chronic hypoxic respiratory failure - COVID Pneumonia - chronic severe COPD Chronically requires 2-4 L of oxygen support at home -continue empiric antibiotic therapy for now - recheck procalcitonin in AM - continue steroid and remdesivir - not a candidate for Actemra given concern for bacterial infection   Recent Labs  Lab 03/05/2019 1620 03/15/19 0353 03/16/19 0344 03/17/19 0419  DDIMER 2.23*  --  >20.00* >20.00*  FERRITIN 1,903* 2,232* 2,205* 1,937*  CRP 17.3* 16.2* 10.0* 5.5*  ALT 24 25 24 26   PROCALCITON 0.39  --   --   --     Markedly elevated D-dimer Empirically anticoagulated - high likelihood of pulmonary embolism given active COVID and recent preceding hospitalization w/ signif imobility   Paroxysmal A. Fib  rate controlled - continue Cardizem - not on anticoagulation as an outpatient - fully anticaog for now as above   Chronic diastolic heart failure No evidence of fluid  overload on exam/appears slightly dry - TTE 08/06/2017 noted EF of 60-65% - wgt stable - minimal to no intake  Filed Weights   03/17/2019 1946 03/16/19 0500 03/17/19 0426  Weight: 60.8 kg 60 kg 60 kg    Mildly elevated troponin Likely nonspecific - no chest pain - would not be a candidate for aggressive intervention   Dementia / anxiety - acute delirium/sundowning  continue namenda and klonopin - haldol prn agitation   GERD Continue Protonix  Hyperlipidemia Continue statin  Generalized deconditioning Overall prognosis is guarded to poor - Palliative Care following   DVT prophylaxis: full dose lovenox  Code Status: DNR - NO CODE Family Communication:  Disposition Plan: med surg -will need SNF placement if survives this hospitalization  Consultants:  none  Objective: Blood pressure (!) 143/41, pulse 91, temperature 97.8 F (36.6 C), temperature source Axillary, resp. rate (!) 25, height 5\' 5"  (1.651 m), weight 60 kg, SpO2 94 %.  Intake/Output Summary (Last 24 hours) at 03/17/2019 1029 Last data filed at 03/17/2019 [id number] Gross per 24 hour  Intake 680 ml  Output 700 ml  Net -20 ml   Filed Weights   03/06/2019 1946 03/16/19 0500 03/17/19 0426  Weight: 60.8 kg 60 kg 60 kg    Examination: General: modest resp distress - appears sob  Lungs: Fine diffuse crackles with active wheezing Cardiovascular: Irregularly irregular without rub or or murmur Abdomen: Soft, bowel sounds hypoactive, no rebound, no mass Extremities: No significant edema bilateral lower extremities   CBC: Recent Labs  Lab 03/15/2019  1620 03/15/19 0353 03/16/19 0344 03/17/19 0419  WBC 6.6 3.2* 11.9* 9.8  NEUTROABS 6.1  --  11.3* 9.3*  HGB 14.8 14.0 14.1 13.2  HCT 45.2 43.4 42.2 40.3  MCV 101.6* 100.9* 99.5 101.3*  PLT 90* 87* 82* 64*   Basic Metabolic Panel: Recent Labs  Lab 03/15/19 0353 03/16/19 0344 03/17/19 0419  NA 140 142 142  K 4.2 4.3 3.9  CL 103 105 103  CO2 23 23 26   GLUCOSE  259* 252* 312*  BUN 30* 34* 40*  CREATININE 1.42* 1.19 1.14  CALCIUM 8.3* 8.4* 8.1*  MG  --  1.7 1.7   GFR: Estimated Creatinine Clearance: 45.3 mL/min (by C-G formula based on SCr of 1.14 mg/dL).  Liver Function Tests: Recent Labs  Lab 03/01/2019 1620 03/15/19 0353 03/16/19 0344 03/17/19 0419  AST 30 29 29 27   ALT 24 25 24 26   ALKPHOS 43 42 49 54  BILITOT 1.2 1.0 0.9 0.9  PROT 6.2* 5.6* 5.7* 5.3*  ALBUMIN 2.7* 2.5* 2.6* 2.6*    HbA1C: Hgb A1c MFr Bld  Date/Time Value Ref Range Status  02/25/2019 03:30 PM 6.7 (H) 4.8 - 5.6 % Final    Comment:    (NOTE) Pre diabetes:          5.7%-6.4% Diabetes:              >6.4% Glycemic control for   <7.0% adults with diabetes   05/10/2018 03:47 PM 7.6 (H) 4.8 - 5.6 % Final    Comment:    (NOTE)         Prediabetes: 5.7 - 6.4         Diabetes: >6.4         Glycemic control for adults with diabetes: <7.0     CBG: Recent Labs  Lab 03/16/19 0724  GLUCAP 221*    Recent Results (from the past 240 hour(s))  Blood Culture (routine x 2)     Status: None (Preliminary result)   Collection Time: 03/28/2019  4:00 PM   Specimen: BLOOD RIGHT ARM  Result Value Ref Range Status   Specimen Description BLOOD RIGHT ARM  Final   Special Requests   Final    BOTTLES DRAWN AEROBIC AND ANAEROBIC Blood Culture adequate volume   Culture   Final    NO GROWTH 2 DAYS Performed at [facility], [address], [place], [place] [address]    Report Status PENDING  Incomplete  Blood Culture (routine x 2)     Status: None (Preliminary result)   Collection Time: 03/28/2019  4:10 PM   Specimen: BLOOD RIGHT HAND  Result Value Ref Range Status   Specimen Description BLOOD RIGHT HAND  Final   Special Requests   Final    BOTTLES DRAWN AEROBIC ONLY Blood Culture results may not be optimal due to an inadequate volume of blood received in culture bottles   Culture   Final    NO GROWTH 2 DAYS Performed at [facility], [place] [address],  [place], [place] [address]    Report Status PENDING  Incomplete  Respiratory Panel by PCR     Status: None   Collection Time: 03/07/2019 10:06 PM   Specimen: Nasopharyngeal Swab; Respiratory  Result Value Ref Range Status   Adenovirus NOT DETECTED NOT DETECTED Final   Coronavirus 229E NOT DETECTED NOT DETECTED Final    Comment: (NOTE) The Coronavirus on the Respiratory Panel, DOES NOT test for the novel  Coronavirus (2019  nCoV)    Coronavirus HKU1 NOT DETECTED NOT DETECTED Final   Coronavirus NL63 NOT DETECTED NOT DETECTED Final   Coronavirus OC43 NOT DETECTED NOT DETECTED Final   Metapneumovirus NOT DETECTED NOT DETECTED Final   Rhinovirus / Enterovirus NOT DETECTED NOT DETECTED Final   Influenza A NOT DETECTED NOT DETECTED Final   Influenza B NOT DETECTED NOT DETECTED Final   Parainfluenza Virus 1 NOT DETECTED NOT DETECTED Final   Parainfluenza Virus 2 NOT DETECTED NOT DETECTED Final   Parainfluenza Virus 3 NOT DETECTED NOT DETECTED Final   Parainfluenza Virus 4 NOT DETECTED NOT DETECTED Final   Respiratory Syncytial Virus NOT DETECTED NOT DETECTED Final   Bordetella pertussis NOT DETECTED NOT DETECTED Final   Chlamydophila pneumoniae NOT DETECTED NOT DETECTED Final   Mycoplasma pneumoniae NOT DETECTED NOT DETECTED Final    Comment: Performed at [facility], [place] [address] [place], [place] [address]  Surgical pcr screen     Status: None   Collection Time: 02/27/2019 10:06 PM   Specimen: Nasopharyngeal Swab; Nasal Swab  Result Value Ref Range Status   MRSA, PCR NEGATIVE NEGATIVE Final   Staphylococcus aureus NEGATIVE NEGATIVE Final    Comment: (NOTE) The Xpert SA Assay (FDA approved for NASAL specimens in patients 36 years of age and older), is one component of a comprehensive surveillance program. It is not intended to diagnose infection nor to guide or monitor treatment. Performed at [facility], [place] [address], [place],  [address]      Scheduled Meds: .  atorvastatin  40 mg Oral QHS  . clonazepam  0.25 mg Oral QHS  . dexamethasone (DECADRON) injection  6 mg Intravenous Q24H  . diltiazem  180 mg Oral q morning - 10a  . enoxaparin (LOVENOX) injection  60 mg Subcutaneous Q12H  . famotidine  40 mg Oral QHS  . furosemide  20 mg Oral q morning - 10a  . insulin aspart  0-5 Units Subcutaneous QHS  . insulin aspart  0-9 Units Subcutaneous TID WC  . memantine  20 mg Oral QHS  . mometasone-formoterol  1 puff Inhalation BID  . pantoprazole  40 mg Oral BID  . QUEtiapine  12.5 mg Oral QHS   Continuous Infusions: . ceFEPime (MAXIPIME) IV 2 g (03/17/19 0626)  . remdesivir 100 mg in NS 100 mL 100 mg (03/17/19 0955)     LOS: 3 days   Cherene Altes, MD [facility] Office  [phone] Pager - Text Page per Amion  If 7PM-7AM, please contact night-coverage per Amion 03/17/2019, 10:29 AM

## 2019-03-17 NOTE — Progress Notes (Signed)
Palliative follow up:  Communicated with bedside RN.  Patient is in wrist restraints on 10L, eating very little, receiving full scope treatment for COVID PNA.  Spoke with Genia Harold (son).  He lives with the patient and cares for him 24/7.  He has decided to be tested for COVID.    We discussed his father's current status.  [name] is not surprised and is very appreciative of the care his father is receiving.    He did warn me that in the past when his father has taken steroids his sundowning becomes much worse.  Son wants his father to DC to SNF when he recovers - he is unable to care for him at home due to progressive dementia.  Agree with Haldol PRN for sundowning.  PMT will continue to follow with you.  Florentina Jenny, PA-C Palliative Medicine Office:  [phone]  No charge.

## 2019-03-17 NOTE — Progress Notes (Signed)
Pharmacy Antibiotic Note  [name] is a 78 y.o. male admitted on 03/18/2019 with COVID pneumonia.  Pharmacy has been consulted to dose cefepime + remdesivir.   Blood cultures remain NGTD, WBC now wnl, CRP down 5.5   Plan: Cefepime 2g IV q12h Remdesivir 100 mg IV q24h through 12/22 Monitor clinical progress, c/s, renal function, cefepime LOT  Height: 5\' 5"  (165.1 cm) Weight: 132 lb 4.4 oz (60 kg) IBW/kg (Calculated) : 61.5  Temp (24hrs), Avg:98 F (36.7 C), Min:97.7 F (36.5 C), Max:98.5 F (36.9 C)  Recent Labs  Lab 03/16/2019 1620 03/12/2019 2044 03/15/19 0353 03/16/19 0344 03/17/19 0419  WBC 6.6  --  3.2* 11.9* 9.8  CREATININE 1.42*  --  1.42* 1.19 1.14  LATICACIDVEN 1.5 1.6  --   --   --     Estimated Creatinine Clearance: 45.3 mL/min (by C-G formula based on SCr of 1.14 mg/dL).    Allergies  Allergen Reactions  . Ticlopidine Hcl Swelling  . Lorazepam Other (See Comments)    Altered mental status from 2 doses of IV Ativan on 05/10/14 HALLUCINATIONS AND DELIRIOUS  . Other Other (See Comments)    Narcotics: Hallucinations and paranoia  . Prednisone Other (See Comments)    NOT TRUE ALLERGY - CONFUSION    Vertis Kelch, PharmD, BCPS PGY2 Cardiology Pharmacy Resident 03/17/2019       9:32 AM  Please check [email or web address] for unit-specific pharmacist phone numbers

## 2019-03-17 NOTE — Progress Notes (Addendum)
Initial Nutrition Assessment  INTERVENTION:   -Ensure Enlive po BID, each supplement provides 350 kcal and 20 grams of protein -Multivitamin with minerals daily  -Pt receiving Hormel Shake daily with Breakfast which provides 520 kcals and 22 g of protein and Magic cup BID with lunch and dinner, each supplement provides 290 kcal and 9 grams of protein, automatically on meal trays to optimize nutritional intake.   **If PO intake unable to improve (mental status, restraints) and within [place], consider Cortrak placement for tube feeding. Next service available 12/23.  NUTRITION DIAGNOSIS:   Increased nutrient needs related to acute illness(COVID-19 infection) as evidenced by estimated needs.  GOAL:   Patient will meet greater than or equal to 90% of their needs  MONITOR:   PO intake, Supplement acceptance, Labs, Weight trends, I & O's  REASON FOR ASSESSMENT:   Consult Assessment of nutrition requirement/status  ASSESSMENT:   78 year old with a history of COPD with chronic hypoxic respiratory failure requiring 2-4 L of O2, HTN, CAD status post CABG, HLD, paroxysmal atrial fibrillation, and a recent admission/discharge 2 weeks prior due to pneumonia and a severe COPD exacerbation who returned to the hospital with worsening severe shortness of breath which have been building over a week's time.  In the ED a CXR raise concern for a pneumonia.  12/17: admitted, COVID+  **RD working remotely**  Patient admitted for cough, SOB and diarrhea. Pt with advanced dementia, disoriented x 3.  Patient recently admitted in November for COPD and pneumonia.  Per MD note, pt with poor prognosis. Palliative care consulted, per note 12/19, pt made DNR. Goal is to treat the treatable. Per chart review, no PO documented for this admission. Last admission pt was consuming 50-100% of meals.  Will order Ensure supplements for additional kcals and protein.  Per weight records, pt has lost 6 lbs since  February 2020, insignificant for time frame).  I/Os: +880 ml since admit UOP: 350 ml x 24 hrs  Medications: Lasix tablet Labs reviewed: CBGs: 221-235  NUTRITION - FOCUSED PHYSICAL EXAM:  Working remotely.  Diet Order:   Diet Order            Diet Carb Modified Fluid consistency: Thin; Room service appropriate? Yes  Diet effective now              EDUCATION NEEDS:   No education needs have been identified at this time  Skin:  Skin Assessment: Reviewed RN Assessment  Last BM:  12/8  Height:   Ht Readings from Last 1 Encounters:  03/01/2019 5\' 5"  (1.651 m)    Weight:   Wt Readings from Last 1 Encounters:  03/17/19 60 kg    Ideal Body Weight:  61.8 kg  BMI:  Body mass index is 22.01 kg/m.  Estimated Nutritional Needs:   Kcal:  1700-1900  Protein:  75-90g  Fluid:  1.9L/day  Clayton Bibles, MS, RD, LDN Inpatient Clinical Dietitian Pager: [phone] After Hours Pager: [phone]

## 2019-03-17 NOTE — Progress Notes (Signed)
Spoke to patient's son Channing and updated via phone.

## 2019-03-17 NOTE — Plan of Care (Signed)
  Problem: Education: Goal: Knowledge of General Education information will improve Description: Including pain rating scale, medication(s)/side effects and non-pharmacologic comfort measures Outcome: Not Progressing   Problem: Health Behavior/Discharge Planning: Goal: Ability to manage health-related needs will improve Outcome: Not Progressing   Problem: Clinical Measurements: Goal: Ability to maintain clinical measurements within normal limits will improve Outcome: Not Progressing Goal: Will remain free from infection Outcome: Not Progressing Goal: Diagnostic test results will improve Outcome: Not Progressing Goal: Respiratory complications will improve Outcome: Not Progressing Goal: Cardiovascular complication will be avoided Outcome: Not Progressing   Problem: Activity: Goal: Risk for activity intolerance will decrease Outcome: Not Progressing   Problem: Nutrition: Goal: Adequate nutrition will be maintained Outcome: Not Progressing   Problem: Coping: Goal: Level of anxiety will decrease Outcome: Not Progressing   Problem: Elimination: Goal: Will not experience complications related to bowel motility Outcome: Not Progressing Goal: Will not experience complications related to urinary retention Outcome: Not Progressing   Problem: Pain Managment: Goal: General experience of comfort will improve Outcome: Not Progressing   Problem: Safety: Goal: Ability to remain free from injury will improve Outcome: Not Progressing  Pt remains on 15 LNC w/NRB. Pt very restless and agitated. BLU-S wrist restraints for safety. Condom cath now on pt. Pt VS WNL for this pt.  Problem: Skin Integrity: Goal: Risk for impaired skin integrity will decrease Outcome: Not Progressing   Problem: Education: Goal: Knowledge of risk factors and measures for prevention of condition will improve Outcome: Not Progressing   Problem: Coping: Goal: Psychosocial and spiritual needs will be  supported Outcome: Not Progressing   Problem: Respiratory: Goal: Will maintain a patent airway Outcome: Not Progressing Goal: Complications related to the disease process, condition or treatment will be avoided or minimized Outcome: Not Progressing

## 2019-03-17 NOTE — Progress Notes (Signed)
ANTICOAGULATION CONSULT NOTE - Follow Up Consult  Pharmacy Consult for Lovenox Indication: Rule out PE  Allergies  Allergen Reactions  . Ticlopidine Hcl Swelling  . Lorazepam Other (See Comments)    Altered mental status from 2 doses of IV Ativan on 05/10/14 HALLUCINATIONS AND DELIRIOUS  . Other Other (See Comments)    Narcotics: Hallucinations and paranoia  . Prednisone Other (See Comments)    NOT TRUE ALLERGY - CONFUSION    Patient Measurements: Height: 5\' 5"  (165.1 cm) Weight: 132 lb 4.4 oz (60 kg) IBW/kg (Calculated) : 61.5  Vital Signs: Temp: 97.8 F (36.6 C) (12/20 0800) Temp Source: Axillary (12/20 0800) BP: 143/41 (12/20 0800) Pulse Rate: 91 (12/20 0800)  Labs: Recent Labs    03/13/2019 1620 03/08/2019 1620 03/16/2019 2044 03/15/19 0353 03/16/19 0344 03/17/19 0419  HGB 14.8   < >  --  14.0 14.1 13.2  HCT 45.2   < >  --  43.4 42.2 40.3  PLT 90*  --   --  87* 82* 64*  CREATININE 1.42*  --   --  1.42* 1.19 1.14  TROPONINIHS 23*  --  19*  --   --   --    < > = values in this interval not displayed.    Estimated Creatinine Clearance: 45.3 mL/min (by C-G formula based on SCr of 1.14 mg/dL).   Medical History: Past Medical History:  Diagnosis Date  . Acute kidney injury ([place]) 05/10/2018  . Alcohol abuse   . Allergic rhinitis   . ANEMIA, B12 DEFICIENCY 12/02/2008  . Arthritis   . CAD (coronary artery disease)    a.  s/p CABG 2011.  Marland Kitchen CAP (community acquired pneumonia) 04/03/2014  . CAROTID ARTERY STENOSIS 04/16/2009  . Cataract   . Chronic diastolic CHF (congestive heart failure) ([place])   . Chronic respiratory failure ([place])   . COLONIC POLYPS 05/21/2007  . Compression fracture of L1 lumbar vertebra (HCC)   . COPD with acute exacerbation ([place]) 06/10/2016  . COPD, severe ([place])   . Diabetes mellitus without complication ([place])   . DIVERTICULAR DISEASE 05/21/2007  . Dysrhythmia   . Essential hypertension   . Falls   . GERD (gastroesophageal reflux disease)   . GI  BLEEDING 09/09/2008  . HCAP (healthcare-associated pneumonia)   . Hemoptysis   . HIATAL HERNIA 05/21/2007  . History of kidney stones   . HYPERLIPIDEMIA 11/21/2006  . Hypertension 08/23/2014  . Internal hemorrhoids   . Low back pain 08/23/2014  . MYOCARDIAL INFARCTION, HX OF 11/21/2006  . NEPHROLITHIASIS 05/21/2007  . Normocytic anemia   . NSVT (nonsustained ventricular tachycardia) ([place])    a. Holter 2015: NSR, A. Fib/flutter, short runs of NSVT  . Olecranon bursitis 07/09/2014   Required surgery and 6 day hospitalization. Some chronic pain at L elbow.    . OSTEOPOROSIS 11/21/2006  . Osteoporosis   . Paroxysmal atrial fibrillation ([place])    a. Anticoag stopped due to falls, rectal bleeding.  . Paroxysmal atrial flutter ([place])   . Pneumonia   . Pulmonary nodule    a. Dx 01/2015 -> advised to f/u PCP for PET.  . Pulmonary nodules    No pulmonary nodules on CTA Chest 07/2017  . PVD (peripheral vascular disease) ([place])    a. s/p prior iliac stenting.  . Syncope 08/23/2014    Medications:  Scheduled:  . atorvastatin  40 mg Oral QHS  . clonazepam  0.25 mg Oral QHS  . dexamethasone (DECADRON) injection  6  mg Intravenous Q24H  . diltiazem  180 mg Oral q morning - 10a  . enoxaparin (LOVENOX) injection  60 mg Subcutaneous Q12H  . famotidine  40 mg Oral QHS  . furosemide  20 mg Oral q morning - 10a  . memantine  20 mg Oral QHS  . mometasone-formoterol  1 puff Inhalation BID  . pantoprazole  40 mg Oral BID  . QUEtiapine  12.5 mg Oral QHS   Infusions:  . ceFEPime (MAXIPIME) IV 2 g (03/17/19 0626)  . remdesivir 100 mg in NS 100 mL Stopped (03/16/19 1900)   PRN: [DISCONTINUED] acetaminophen **OR** acetaminophen, albuterol, guaiFENesin-dextromethorphan, haloperidol lactate, ondansetron **OR** ondansetron (ZOFRAN) IV, senna-docusate  Assessment: 78 yo male with history of Afib not on anticoagulation, severe COPD and COVID-19 pneumonia.  Pharmacy consulted to increase Lovenox to full-dose for  clinically suspected PE with rising d-dimer, remains > 20.  Of note, platelets currently 64k, 90k on admit prior to starting prophylactic Lovenox. H/H wnl, CrCl 45 ml/min.  Goal of Therapy:  Anti-Xa level 0.6-1 units/ml 4hrs after LMWH dose given Monitor platelets by anticoagulation protocol: Yes   Plan:   Continue Lovenox 60mg  SQ q12h  Monitor renal function, CBC, signs/symptoms of bleeding  Vertis Kelch, PharmD, BCPS PGY2 Cardiology Pharmacy Resident 03/17/2019       9:27 AM  Please check [email or web address] for unit-specific pharmacist phone numbers

## 2019-03-18 DIAGNOSIS — Z7189 Other specified counseling: Secondary | ICD-10-CM

## 2019-03-18 DIAGNOSIS — F03918 Unspecified dementia, unspecified severity, with other behavioral disturbance: Secondary | ICD-10-CM | POA: Diagnosis present

## 2019-03-18 DIAGNOSIS — Z515 Encounter for palliative care: Secondary | ICD-10-CM

## 2019-03-18 DIAGNOSIS — F0391 Unspecified dementia with behavioral disturbance: Secondary | ICD-10-CM | POA: Diagnosis present

## 2019-03-18 LAB — CBC WITH DIFFERENTIAL/PLATELET
Abs Immature Granulocytes: 0.05 10*3/uL (ref 0.00–0.07)
Basophils Absolute: 0 10*3/uL (ref 0.0–0.1)
Basophils Relative: 0 %
Eosinophils Absolute: 0 10*3/uL (ref 0.0–0.5)
Eosinophils Relative: 0 %
HCT: 42.5 % (ref 39.0–52.0)
Hemoglobin: 13.9 g/dL (ref 13.0–17.0)
Immature Granulocytes: 0 %
Lymphocytes Relative: 2 %
Lymphs Abs: 0.3 10*3/uL — ABNORMAL LOW (ref 0.7–4.0)
MCH: 32.9 pg (ref 26.0–34.0)
MCHC: 32.7 g/dL (ref 30.0–36.0)
MCV: 100.7 fL — ABNORMAL HIGH (ref 80.0–100.0)
Monocytes Absolute: 0.3 10*3/uL (ref 0.1–1.0)
Monocytes Relative: 3 %
Neutro Abs: 10.6 10*3/uL — ABNORMAL HIGH (ref 1.7–7.7)
Neutrophils Relative %: 95 %
Platelets: 60 10*3/uL — ABNORMAL LOW (ref 150–400)
RBC: 4.22 MIL/uL (ref 4.22–5.81)
RDW: 14.6 % (ref 11.5–15.5)
WBC: 11.2 10*3/uL — ABNORMAL HIGH (ref 4.0–10.5)
nRBC: 0 % (ref 0.0–0.2)

## 2019-03-18 LAB — COMPREHENSIVE METABOLIC PANEL
ALT: 24 U/L (ref 0–44)
AST: 23 U/L (ref 15–41)
Albumin: 2.6 g/dL — ABNORMAL LOW (ref 3.5–5.0)
Alkaline Phosphatase: 72 U/L (ref 38–126)
Anion gap: 12 (ref 5–15)
BUN: 41 mg/dL — ABNORMAL HIGH (ref 8–23)
CO2: 26 mmol/L (ref 22–32)
Calcium: 8.2 mg/dL — ABNORMAL LOW (ref 8.9–10.3)
Chloride: 111 mmol/L (ref 98–111)
Creatinine, Ser: 1.01 mg/dL (ref 0.61–1.24)
GFR calc Af Amer: >60 mL/min (ref >60)
GFR calc non Af Amer: >60 mL/min (ref >60)
Glucose, Bld: 224 mg/dL — ABNORMAL HIGH (ref 70–99)
Potassium: 4 mmol/L (ref 3.5–5.1)
Sodium: 149 mmol/L — ABNORMAL HIGH (ref 135–145)
Total Bilirubin: 1.8 mg/dL — ABNORMAL HIGH (ref 0.3–1.2)
Total Protein: 5.2 g/dL — ABNORMAL LOW (ref 6.5–8.1)

## 2019-03-18 LAB — GLUCOSE, CAPILLARY
Glucose-Capillary: 220 mg/dL — ABNORMAL HIGH (ref 70–99)
Glucose-Capillary: 195 mg/dL — ABNORMAL HIGH (ref 70–99)
Glucose-Capillary: 327 mg/dL — ABNORMAL HIGH (ref 70–99)

## 2019-03-18 LAB — C-REACTIVE PROTEIN: CRP: 3.3 mg/dL — ABNORMAL HIGH (ref <1.0)

## 2019-03-18 LAB — FERRITIN: Ferritin: 2007 ng/mL — ABNORMAL HIGH (ref 24–336)

## 2019-03-18 LAB — MAGNESIUM: Magnesium: 1.7 mg/dL (ref 1.7–2.4)

## 2019-03-18 LAB — D-DIMER, QUANTITATIVE: D-Dimer, Quant: >20 ug/mL-FEU — ABNORMAL HIGH (ref 0.00–0.50)

## 2019-03-18 LAB — PROCALCITONIN: Procalcitonin: <0.1 ng/mL

## 2019-03-18 MED ORDER — METOPROLOL TARTRATE 5 MG/5ML IV SOLN
5.0000 mg | Freq: Four times a day (QID) | INTRAVENOUS | Status: DC
  Administered 2019-03-18 – 2019-03-19 (×2): 5 mg via INTRAVENOUS
  Filled 2019-03-18 (×2): qty 5

## 2019-03-18 MED ORDER — LORAZEPAM 2 MG/ML IJ SOLN
0.5000 mg | INTRAMUSCULAR | Status: DC | PRN
  Filled 2019-03-18: qty 1

## 2019-03-18 MED ORDER — PANTOPRAZOLE SODIUM 40 MG IV SOLR
40.0000 mg | Freq: Two times a day (BID) | INTRAVENOUS | Status: DC
  Administered 2019-03-18: 22:00:00 40 mg via INTRAVENOUS
  Filled 2019-03-18 (×2): qty 40

## 2019-03-18 MED ORDER — MORPHINE SULFATE (PF) 2 MG/ML IV SOLN
1.0000 mg | INTRAVENOUS | Status: DC | PRN
  Filled 2019-03-18: qty 1

## 2019-03-18 NOTE — Progress Notes (Signed)
Palliative Medicine RN Note: Rec'd a phone call from patient's sons Derius and [name]. They had follow up questions for PMT NP Megan regarding oxygen requirements required to be discharged from/transferred out of [facility], D-Dimer results, and setting up a video call. Confirmed correct phone numbers in [place].  I paged Megan regarding O2 question, provided D-Dimer result, and called [facility] (spoke w Jarrett Soho) about setting up video chat with [name] and Eddie Dibbles.   Marjie Skiff Donie Lemelin, RN, BSN, [facility] Palliative Medicine Team 03/18/2019 3:37 PM Office [phone]

## 2019-03-18 NOTE — Progress Notes (Signed)
Daily Progress Note   Patient Name: [name]       Date: 03/18/2019 DOB: October 22, 1940  Age: 78 y.o. MRN#: [id number] Attending Physician: Cherene Altes, MD Primary Care Physician: Reubin Milan, MD Admit Date: 03/12/2019  Reason for Consultation/Follow-up: Establishing goals of care  Subjective: Per RN report, patient with lethargy and intermittent restlessness. He is disoriented and desats when awake and just on HFNC. Currently requiring HFNC and NRB mask. Oxygen saturations stable when sleeping He is restrained. No oral intake today and unable to take medications.   GOC:  F/u with son, Andrae via telephone. Reviewed course of hospitalization including diagnoses, interventions, plan of care, and poor prognosis. Discussed underlying chronic co-morbidities including COPD and severe dementia contributing to overall poor prognosis. Discussed poor cognitive/functional/nutritional status. Damato shares that his father has had a drastic health decline two weeks leading up to hospitalization.   Kourtland is experiencing caregiver burnout and explains that if his father does recovery from this, he will need SNF placement and likely long-term care.   Discussed watchful waiting and completion of remdesivir tomorrow. Nayib is realistic and believes his father may not leave the hospital. "I don't expect him to leave" and "if I got a call that he passed, it wouldn't shock me." Micharl confirms decision for DNR/DNI code status. Yia shares that his father's quality of life has been poor and his father previously spoke of wishes against prolonged interventions, stating "I have lived my life." The patient is surprised that he outlived his wife.   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 Frankly and compassionately explained that his father may not survive outside of the hospital, with increased oxygen requirement today. Eulas understands and is appreciative of the information.   PMT contact information given. Encouraged Utsav to call with questions or concerns. Reassured of daily palliative f/u.   [id number] Received call from Cassian to further discuss plan of care. Answered questions. Reassured of palliative f/u in AM pending his father's clinical course.   Length of Stay: 4  Current Medications: Scheduled Meds:   atorvastatin  40 mg Oral QHS   clonazepam  0.25 mg Oral QHS   diltiazem  180 mg Oral q  morning - 10a   enoxaparin (LOVENOX) injection  60 mg Subcutaneous Q12H   famotidine  40 mg Oral QHS   feeding supplement (ENSURE ENLIVE)  237 mL Oral BID BM   furosemide  20 mg Oral q morning - 10a   insulin aspart  0-5 Units Subcutaneous QHS   insulin aspart  0-9 Units Subcutaneous TID WC   memantine  20 mg Oral QHS   methylPREDNISolone (SOLU-MEDROL) injection  60 mg Intravenous Q12H   mometasone-formoterol  1 puff Inhalation BID   multivitamin with minerals  1 tablet Oral Daily   pantoprazole  40 mg Oral BID   QUEtiapine  12.5 mg Oral QHS    Continuous Infusions:  ceFEPime (MAXIPIME) IV 2 g (03/18/19 [id number])   remdesivir 100 mg in NS 100 mL 100 mg (03/18/19 1004)    PRN Meds: [DISCONTINUED] acetaminophen **OR** acetaminophen, albuterol, guaiFENesin-dextromethorphan, haloperidol lactate, ondansetron **OR** ondansetron (ZOFRAN) IV, senna-docusate  Physical Exam Vitals and nursing note reviewed.            Vital Signs: BP (!) 158/80 (BP Location: Left Arm)    Pulse 71    Temp 97.8 F (36.6 C) (Axillary)    Resp (!) 28    Ht 5\' 5"  (1.651 m)    Wt 54.5 kg    SpO2 (!) 85%    BMI 20.01 kg/m  SpO2: SpO2: (!) 85 % O2 Device: O2 Device: High Flow Nasal  Cannula, NRB O2 Flow Rate: O2 Flow Rate (L/min): 10 L/min  Intake/output summary:   Intake/Output Summary (Last 24 hours) at 03/18/2019 1431 Last data filed at 03/18/2019 0600 Gross per 24 hour  Intake 100 ml  Output 950 ml  Net -850 ml   LBM:   Baseline Weight: Weight: 60.8 kg Most recent weight: Weight: 54.5 kg       Palliative Assessment/Data: PPS 20%      Patient Active Problem List   Diagnosis Date Noted   COVID-19 03/16/2019   Alzheimer's dementia without behavioral disturbance ([place])    Pneumonia due to COVID-19 virus 03/15/2019   Respiratory failure ([place]) 03/18/2019   Healthcare-associated pneumonia 03/23/2019   Chronic respiratory failure with hypoxia ([place]) 02/25/2019   Pulmonary hypertension ([place]) 05/13/2018   Acute delirium    Shortness of breath 05/11/2018   Acute kidney injury ([place]) 05/10/2018   Hematuria, microscopic 05/10/2018   Dehydration 05/10/2018   Abdominal aortic aneurysm ([place]) 05/10/2018   Lung nodule, solitary    Iron deficiency anemia 02/15/2015   CAD (coronary artery disease) 09/18/2014   COPD (chronic obstructive pulmonary disease) ([place]) 08/23/2014   Chronic atrial fibrillation 08/23/2014   Diabetes mellitus type II, controlled ([place]) 07/01/2014   Acute on chronic respiratory failure with hypoxia ([place]) 05/14/2014   COPD exacerbation (HCC)    Chronic respiratory failure with hypercapnia ([place]) 05/10/2014   CAP (community acquired pneumonia) 04/03/2014   Allergic rhinitis 02/06/2014   GERD (gastroesophageal reflux disease) 02/06/2014   Do not resuscitate 02/06/2014   Erectile dysfunction 06/18/2010   Atrial fibrillation ([place]) 11/06/2009   Peripheral vascular disease ([place]) 09/02/2009   Carotid artery stenosis 04/16/2009   ANEMIA, B12 DEFICIENCY 12/02/2008   TOBACCO USE 01/19/2007   Hyperlipidemia 11/21/2006   Essential hypertension 11/21/2006   Hx of CABG 11/21/2006   Osteoporosis 11/21/2006     Palliative Care Assessment & Plan   Patient Profile: 78 y.o. male  with past medical history of end stage COPD, CAD, CABG x 5, atrial fibrillation s/p cardioversion, dementia and anxiety who  was admitted on 03/06/2019 with SOB.  He was found to have pneumonia and was COVID positive.  He had a recent hospital admission earlier in December for COPD and a syncopal episode.     Assessment: Acute on chronic respiratory failure COVID pneumonia Severe COPD Paroxysmal a.fib Chronic diastolic heart failure Elevated D-dimer Dementia Acute delirium  Generalized deconditioning   Recommendations/Plan:  DNR/DNI. No escalation of care.   Continue current plan of care and medical management. Pt will complete remdesivir 12/22.  Watchful waiting pending clinical course. Son realistic and does seem to understand poor prognosis. If no improvement, likely shift to comfort focused care in 24-48 hours. PMT will follow.  RN to arrange video chat with family.    Code Status: DNR   Code Status Orders  (From admission, onward)         Start     Ordered   03/19/2019 1806  Do not attempt resuscitation (DNR)  Continuous    Question Answer Comment  In the event of cardiac or respiratory ARREST Do not call a code blue   In the event of cardiac or respiratory ARREST Do not perform Intubation, CPR, defibrillation or ACLS   In the event of cardiac or respiratory ARREST Use medication by any route, position, wound care, and other measures to relive pain and suffering. May use oxygen, suction and manual treatment of airway obstruction as needed for comfort.      03/09/2019 1812        Code Status History    Date Active Date Inactive Code Status Order ID Comments User Context   02/25/2019 1318 03/05/2019 1431 DNR [id number]  Mckinley Jewel, MD ED   05/10/2018 1407 05/14/2018 1843 DNR [id number]  Bonnita Hollow, MD ED   03/15/2018 1749 03/19/2018 1726 DNR [id number]  Kayleen Memos, DO ED   08/04/2017  0253 08/06/2017 1831 DNR [id number]  Rise Patience, MD Inpatient   06/10/2016 1742 06/14/2016 1615 DNR [id number]  Riccardo Dubin, MD Inpatient   02/23/2016 0051 02/25/2016 1601 DNR [id number]  Ivor Costa, MD ED   02/14/2015 1405 02/19/2015 1549 DNR [id number]  Dellia Nims, MD Inpatient   02/14/2015 1238 02/14/2015 1405 DNR [id number]  Fredia Sorrow, MD ED   08/27/2014 1150 08/29/2014 1518 DNR [id number]  Marijean Heath, NP Inpatient   08/23/2014 0702 08/27/2014 1150 Full Code [id number]  Rise Patience, MD Inpatient   07/09/2014 2101 07/10/2014 1510 Full Code [id number]  Iran Planas, MD Inpatient   05/10/2014 1244 05/16/2014 1608 DNR [id number]  Karen Kitchens Inpatient   Advance Care Planning Activity    Advance Directive Documentation     Most Recent Value  Type of Advance Directive  Healthcare Power of Attorney, Living will  Pre-existing out of facility DNR order (yellow form or pink MOST form)  --  "MOST" Form in Place?  --       Prognosis:  Poor prognosis  Discharge Planning:  To Be Determined   Care plan was discussed with RN, son Garverick)  Thank you for allowing the Palliative Medicine Team to assist in the care of this patient.  The above conversation was completed via telephone due to visitor restrictions during COVID-19 pandemic. Thorough chart review and discussion with multidisciplinary team was completed as part of assessment. No physical examination was performed.    Time In: 1400 1527- Time Out: 1430 1537 Total Time 40 Prolonged Time Billed  no      Greater than 50%  of this time was spent counseling and coordinating care related to the above assessment and plan.  Ihor Dow, DNP, FNP-C Palliative Medicine Team  Phone: [phone] Fax: [phone]  Please contact Palliative Medicine Team phone at [phone] for questions and concerns.

## 2019-03-18 NOTE — Evaluation (Signed)
Physical Therapy Evaluation Patient Details Name: [name] MRN: [id number] DOB: 1941-02-23 Today's Date: 03/18/2019   History of Present Illness  78 year old with a history of COPD with chronic hypoxic respiratory failure requiring 2-4 L of O2, HTN, CAD status post CABG, HLD, paroxysmal atrial fibrillation, and a recent admission/discharge 2 weeks prior due to pneumonia and a severe COPD exacerbation who returned to the hospital with worsening severe shortness of breath which have been building over a week's time.  In the ED a CXR raise concern for a pneumonia.  Clinical Impression   Pt admitted with above diagnosis. All hx is from chart as pt did not answer any of therapists questions. PTA was living at home with son who was primary caregiver assisting as needed. Pt was apparently ambulatory with walker at home. From chart hx pt seems to have been having steady decline in function. Pt currently with functional limitations due to the deficits listed below (see PT Problem List). This pm pt is quite agitated, he is in B wrist restraints and is noted to be trashing around in bed. He is at total assist with all functional mobility this pm. Pt was on 10L/minvia HFNC and also on NRB at 15 to maintain sats in 90s.  Pt will benefit from skilled PT to increase their independence and safety with mobility to allow discharge to the venue listed below.       Follow Up Recommendations SNF    Equipment Recommendations  None recommended by PT    Recommendations for Other Services       Precautions / Restrictions Precautions Precautions: Fall Precaution Comments: B wrist restraints in place  Restrictions Weight Bearing Restrictions: No      Mobility  Bed Mobility Overal bed mobility: Needs Assistance Bed Mobility: Rolling;Supine to Sit;Sit to Supine Rolling: Total assist   Supine to sit: Max assist Sit to supine: Max assist      Transfers                 General transfer comment:  did not attempt transfer at this session sec to safety concerns  Ambulation/Gait             General Gait Details: did not attempt ambulation sec to safety concerns  Stairs            Wheelchair Mobility    Modified Rankin (Stroke Patients Only)       Balance Overall balance assessment: Needs assistance   Sitting balance-Leahy Scale: Poor       Standing balance-Leahy Scale: Zero                               Pertinent Vitals/Pain Pain Assessment: Faces Faces Pain Scale: Hurts a little bit Pain Location: seemed to be uncomfortable    Home Living Family/patient expects to be discharged to:: Skilled nursing facility Living Arrangements: Children Available Help at Discharge: Family;Available 24 hours/day;Personal care attendant;Available PRN/intermittently Type of Home: House Home Access: Ramped entrance     Home Layout: One level Home Equipment: Summit - 4 wheels;Cane - single point;Grab bars - toilet;Grab bars - tub/shower;Tub bench;Walker - 2 wheels;Bedside commode Additional Comments: Pt was previously living with his son who was his 24/7 caregiver. Son unable to care for pt at home.     Prior Function Level of Independence: Needs assistance   Gait / Transfers Assistance Needed: assisted with walker  ADL's / Homemaking Assistance Needed: son  was assisting as needed        Hand Dominance        Extremity/Trunk Assessment   Upper Extremity Assessment Upper Extremity Assessment: Generalized weakness    Lower Extremity Assessment Lower Extremity Assessment: Generalized weakness       Communication   Communication: Receptive difficulties;Expressive difficulties  Cognition Arousal/Alertness: Lethargic Behavior During Therapy: Restless;Agitated Overall Cognitive Status: History of cognitive impairments - at baseline Area of Impairment: Orientation;Attention;Memory;Following commands;Safety/judgement;Awareness;Problem solving                  Orientation Level: Disoriented to;Place;Time;Situation   Memory: Decreased recall of precautions;Decreased short-term memory Following Commands: Follows one step commands inconsistently Safety/Judgement: Decreased awareness of safety;Decreased awareness of deficits   Problem Solving: Decreased initiation;Difficulty sequencing;Requires verbal cues;Requires tactile cues        General Comments      Exercises     Assessment/Plan    PT Assessment Patient needs continued PT services  PT Problem List Decreased strength;Decreased range of motion;Decreased activity tolerance;Decreased balance;Decreased mobility;Decreased coordination;Decreased safety awareness       PT Treatment Interventions DME instruction;Gait training;Functional mobility training;Therapeutic activities;Therapeutic exercise;Balance training;Neuromuscular re-education;Patient/family education    PT Goals (Current goals can be found in the Care Plan section)  Acute Rehab PT Goals Patient Stated Goal: Unable to state Time For Goal Achievement: 04/01/19 Potential to Achieve Goals: Fair    Frequency Min 2X/week   Barriers to discharge   son unable to care for him at home any longer    Co-evaluation               AM-PAC PT "6 Clicks" Mobility  Outcome Measure Help needed turning from your back to your side while in a flat bed without using bedrails?: Total Help needed moving from lying on your back to sitting on the side of a flat bed without using bedrails?: Total Help needed moving to and from a bed to a chair (including a wheelchair)?: Total Help needed standing up from a chair using your arms (e.g., wheelchair or bedside chair)?: Total Help needed to walk in hospital room?: Total Help needed climbing 3-5 steps with a railing? : Total 6 Click Score: 6    End of Session Equipment Utilized During Treatment: Oxygen Activity Tolerance: Treatment limited secondary to medical  complications (Comment) Patient left: in bed;with call bell/phone within reach   PT Visit Diagnosis: Other abnormalities of gait and mobility (R26.89);Muscle weakness (generalized) (M62.81)    Time: [id number] PT Time Calculation (min) (ACUTE ONLY): 14 min   Charges:   PT Evaluation $PT Eval High Complexity: 1 High          Horald Chestnut, PT   Delford Field 03/18/2019, 3:57 PM

## 2019-03-18 NOTE — Progress Notes (Signed)
[name]  [id number] DOB: December 11, 1940 DOA: 03/13/2019 PCP: Reubin Milan, MD    Brief Narrative:  78yo with a history of COPD with chronic hypoxic respiratory failure requiring 2-4 L of O2, HTN, CAD status post CABG, HLD, paroxysmal atrial fibrillation, and a recent admission/discharge 2 weeks prior due to a severe COPD exacerbation and fluid overload who returned to the hospital with worsening severe SOB which has been building over a week's time.  In the ED a CXR raised concern for a pneumonia. He was found to be COVID+.   Significant Events: 11/30 > 12/8 COPD exac + fluid overload  12/17 admit via [facility] w/ SOB   COVID-19 specific Treatment: Remdesivir 12/18 > Solu-Medrol 12/17 >   Date of Positive COVID Test: 03/08/2019 (was negative 02/25/2019)  Antimicrobials:  Cefepime 12/17 > Vancomycin 12/17  Subjective: The patient is delirious and unable to provide any history for me.  He appears mildly uncomfortable with some mild respiratory distress/increased work of breathing.  Assessment & Plan:  Acute on chronic hypoxic respiratory failure - COVID Pneumonia - chronic severe COPD Chronically requires 2-4 L of oxygen support at home - discontinue empiric antibiotic therapy as procalcitonin has normalized - continue steroid and remdesivir  Recent Labs  Lab 03/27/2019 1620 03/15/19 0353 03/16/19 0344 03/17/19 0419 03/18/19 0441  DDIMER 2.23*  --  >20.00* >20.00* >20.00*  FERRITIN 1,903* 2,232* 2,205* 1,937* 2,007*  CRP 17.3* 16.2* 10.0* 5.5* 3.3*  ALT 24 25 24 26 24   PROCALCITON 0.39  --   --   --  <0.10    Markedly elevated D-dimer Empirically anticoagulated - high likelihood of pulmonary embolism given active COVID and recent preceding hospitalization w/ signif imobility   Paroxysmal A. Fib  rate controlled - continue Cardizem - not on anticoagulation as an outpatient - fully anticaog for now as above   Chronic diastolic heart failure No evidence of fluid  overload on exam/appears slightly dry - TTE 08/06/2017 noted EF of 60-65% - wgt stable - minimal to no intake  Filed Weights   03/16/19 0500 03/17/19 0426 03/18/19 0332  Weight: 60 kg 60 kg 54.5 kg    Mildly elevated troponin Likely nonspecific - no chest pain - would not be a candidate for aggressive intervention   Dementia / anxiety - acute delirium/sundowning  continue namenda and klonopin - haldol prn agitation   GERD Continue Protonix  Hyperlipidemia Continue statin  Generalized deconditioning Overall prognosis is guarded to poor - Palliative Care following   DVT prophylaxis: full dose lovenox  Code Status: DNR - NO CODE Family Communication:  Disposition Plan: med surg -will need SNF placement if survives this hospitalization  Consultants:  none  Objective: Blood pressure (!) 158/80, pulse 80, temperature 97.8 F (36.6 C), temperature source Axillary, resp. rate (!) 22, height 5\' 5"  (1.651 m), weight 54.5 kg, SpO2 98 %.  Intake/Output Summary (Last 24 hours) at 03/18/2019 0936 Last data filed at 03/18/2019 0600 Gross per 24 hour  Intake 100 ml  Output 950 ml  Net -850 ml   Filed Weights   03/16/19 0500 03/17/19 0426 03/18/19 0332  Weight: 60 kg 60 kg 54.5 kg    Examination: General: modest resp distress - appears to be sob  Lungs: Fine diffuse crackles - no wheeze  Cardiovascular: Irregularly irregular without M Abdomen: Soft, bowel sounds hypoactive, no rebound, no mass Extremities: No significant edema B LE    CBC: Recent Labs  Lab 03/16/19 0344 03/17/19 0419 03/18/19  0441  WBC 11.9* 9.8 11.2*  NEUTROABS 11.3* 9.3* 10.6*  HGB 14.1 13.2 13.9  HCT 42.2 40.3 42.5  MCV 99.5 101.3* 100.7*  PLT 82* 64* 60*   Basic Metabolic Panel: Recent Labs  Lab 03/16/19 0344 03/17/19 0419 03/18/19 0441  NA 142 142 149*  K 4.3 3.9 4.0  CL 105 103 111  CO2 23 26 26   GLUCOSE 252* 312* 224*  BUN 34* 40* 41*  CREATININE 1.19 1.14 1.01  CALCIUM 8.4*  8.1* 8.2*  MG 1.7 1.7 1.7   GFR: Estimated Creatinine Clearance: 46.5 mL/min (by C-G formula based on SCr of 1.01 mg/dL).  Liver Function Tests: Recent Labs  Lab 03/15/19 0353 03/16/19 0344 03/17/19 0419 03/18/19 0441  AST 29 29 27 23   ALT 25 24 26 24   ALKPHOS 42 49 54 72  BILITOT 1.0 0.9 0.9 1.8*  PROT 5.6* 5.7* 5.3* 5.2*  ALBUMIN 2.5* 2.6* 2.6* 2.6*    HbA1C: Hgb A1c MFr Bld  Date/Time Value Ref Range Status  02/25/2019 03:30 PM 6.7 (H) 4.8 - 5.6 % Final    Comment:    (NOTE) Pre diabetes:          5.7%-6.4% Diabetes:              >6.4% Glycemic control for   <7.0% adults with diabetes   05/10/2018 03:47 PM 7.6 (H) 4.8 - 5.6 % Final    Comment:    (NOTE)         Prediabetes: 5.7 - 6.4         Diabetes: >6.4         Glycemic control for adults with diabetes: <7.0     CBG: Recent Labs  Lab 03/16/19 0724 03/17/19 1218 03/17/19 1726 03/17/19 2139 03/18/19 0728  GLUCAP 221* 235* 227* 202* 220*    Recent Results (from the past 240 hour(s))  Blood Culture (routine x 2)     Status: None (Preliminary result)   Collection Time: 03/26/2019  4:00 PM   Specimen: BLOOD RIGHT ARM  Result Value Ref Range Status   Specimen Description BLOOD RIGHT ARM  Final   Special Requests   Final    BOTTLES DRAWN AEROBIC AND ANAEROBIC Blood Culture adequate volume   Culture   Final    NO GROWTH 4 DAYS Performed at [facility], [place] [address] [place], [place] [address]    Report Status PENDING  Incomplete  Blood Culture (routine x 2)     Status: None (Preliminary result)   Collection Time: 03/07/2019  4:10 PM   Specimen: BLOOD RIGHT HAND  Result Value Ref Range Status   Specimen Description BLOOD RIGHT HAND  Final   Special Requests   Final    BOTTLES DRAWN AEROBIC ONLY Blood Culture results may not be optimal due to an inadequate volume of blood received in culture bottles   Culture   Final    NO GROWTH 4 DAYS Performed at [facility], [place] [address],  [place], [place] [address]    Report Status PENDING  Incomplete  Respiratory Panel by PCR     Status: None   Collection Time: 03/22/2019 10:06 PM   Specimen: Nasopharyngeal Swab; Respiratory  Result Value Ref Range Status   Adenovirus NOT DETECTED NOT DETECTED Final   Coronavirus 229E NOT DETECTED NOT DETECTED Final    Comment: (NOTE) The Coronavirus on the Respiratory Panel, DOES NOT test for the novel  Coronavirus (2019 nCoV)  Coronavirus HKU1 NOT DETECTED NOT DETECTED Final   Coronavirus NL63 NOT DETECTED NOT DETECTED Final   Coronavirus OC43 NOT DETECTED NOT DETECTED Final   Metapneumovirus NOT DETECTED NOT DETECTED Final   Rhinovirus / Enterovirus NOT DETECTED NOT DETECTED Final   Influenza A NOT DETECTED NOT DETECTED Final   Influenza B NOT DETECTED NOT DETECTED Final   Parainfluenza Virus 1 NOT DETECTED NOT DETECTED Final   Parainfluenza Virus 2 NOT DETECTED NOT DETECTED Final   Parainfluenza Virus 3 NOT DETECTED NOT DETECTED Final   Parainfluenza Virus 4 NOT DETECTED NOT DETECTED Final   Respiratory Syncytial Virus NOT DETECTED NOT DETECTED Final   Bordetella pertussis NOT DETECTED NOT DETECTED Final   Chlamydophila pneumoniae NOT DETECTED NOT DETECTED Final   Mycoplasma pneumoniae NOT DETECTED NOT DETECTED Final    Comment: Performed at [facility], [place] [address], [place], [place] [address]  Surgical pcr screen     Status: None   Collection Time: 03/19/2019 10:06 PM   Specimen: Nasopharyngeal Swab; Nasal Swab  Result Value Ref Range Status   MRSA, PCR NEGATIVE NEGATIVE Final   Staphylococcus aureus NEGATIVE NEGATIVE Final    Comment: (NOTE) The Xpert SA Assay (FDA approved for NASAL specimens in patients 5 years of age and older), is one component of a comprehensive surveillance program. It is not intended to diagnose infection nor to guide or monitor treatment. Performed at [facility], [place] [address], [place], [place] [address]      Scheduled Meds: .  atorvastatin  40 mg Oral QHS  . clonazepam  0.25 mg Oral QHS  . diltiazem  180 mg Oral q morning - 10a  . enoxaparin (LOVENOX) injection  60 mg Subcutaneous Q12H  . famotidine  40 mg Oral QHS  . feeding supplement (ENSURE ENLIVE)  237 mL Oral BID BM  . furosemide  20 mg Oral q morning - 10a  . insulin aspart  0-5 Units Subcutaneous QHS  . insulin aspart  0-9 Units Subcutaneous TID WC  . memantine  20 mg Oral QHS  . methylPREDNISolone (SOLU-MEDROL) injection  60 mg Intravenous Q12H  . mometasone-formoterol  1 puff Inhalation BID  . multivitamin with minerals  1 tablet Oral Daily  . pantoprazole  40 mg Oral BID  . QUEtiapine  12.5 mg Oral QHS   Continuous Infusions: . ceFEPime (MAXIPIME) IV 2 g (03/18/19 [id number])  . remdesivir 100 mg in NS 100 mL Stopped (03/17/19 1025)     LOS: 4 days   Cherene Altes, MD [facility] Office  [phone] Pager - Text Page per Amion  If 7PM-7AM, please contact night-coverage per Amion 03/18/2019, 9:36 AM

## 2019-03-18 NOTE — Evaluation (Addendum)
Clinical/Bedside Swallow Evaluation Patient Details  Name: [name] MRN: [id number] Date of Birth: [birth year]  Today's Date: 03/18/2019 Time: SLP Start Time (ACUTE ONLY): [id number] SLP Stop Time (ACUTE ONLY): 0850 SLP Time Calculation (min) (ACUTE ONLY): 16 min  Past Medical History:  Past Medical History:  Diagnosis Date  . Acute kidney injury ([place]) 05/10/2018  . Alcohol abuse   . Allergic rhinitis   . ANEMIA, B12 DEFICIENCY 12/02/2008  . Arthritis   . CAD (coronary artery disease)    a.  s/p CABG 2011.  [place] Kitchen CAP (community acquired pneumonia) 04/03/2014  . CAROTID ARTERY STENOSIS 04/16/2009  . Cataract   . Chronic diastolic CHF (congestive heart failure) ([place])   . Chronic respiratory failure ([place])   . COLONIC POLYPS 05/21/2007  . Compression fracture of L1 lumbar vertebra ([facility])   . COPD with acute exacerbation ([place]) 06/10/2016  . COPD, severe ([place])   . Diabetes mellitus without complication ([place])   . DIVERTICULAR DISEASE 05/21/2007  . Dysrhythmia   . Essential hypertension   . Falls   . GERD (gastroesophageal reflux disease)   . GI BLEEDING 09/09/2008  . HCAP (healthcare-associated pneumonia)   . Hemoptysis   . HIATAL HERNIA 05/21/2007  . History of kidney stones   . HYPERLIPIDEMIA 11/21/2006  . Hypertension 08/23/2014  . Internal hemorrhoids   . Low back pain 08/23/2014  . MYOCARDIAL INFARCTION, HX OF 11/21/2006  . NEPHROLITHIASIS 05/21/2007  . Normocytic anemia   . NSVT (nonsustained ventricular tachycardia) ([place])    a. Holter 2015: NSR, A. Fib/flutter, short runs of NSVT  . Olecranon bursitis 07/09/2014   Required surgery and 6 day hospitalization. Some chronic pain at L elbow.    . OSTEOPOROSIS 11/21/2006  . Osteoporosis   . Paroxysmal atrial fibrillation ([place])    a. Anticoag stopped due to falls, rectal bleeding.  . Paroxysmal atrial flutter ([place])   . Pneumonia   . Pulmonary nodule    a. Dx 01/2015 -> advised to f/u PCP for PET.  . Pulmonary nodules    No pulmonary nodules  on CTA Chest 07/2017  . PVD (peripheral vascular disease) ([place])    a. s/p prior iliac stenting.  . Syncope 08/23/2014   Past Surgical History:  Past Surgical History:  Procedure Laterality Date  . APPENDECTOMY    . CARDIOVERSION N/A 04/16/2014   Procedure: CARDIOVERSION;  Surgeon: Josue Hector, MD;  Location: [facility] ENDOSCOPY;  Service: Cardiovascular;  Laterality: N/A;  . CHOLECYSTECTOMY    . CORONARY ARTERY BYPASS GRAFT  2011  . CORONARY ARTERY BYPASS GRAFT     x 5  . CORONARY STENT PLACEMENT     03/07/2001  . HEMORRHOID SURGERY    . INGUINAL HERNIA REPAIR  10/10/08  . MASS EXCISION Left 07/09/2014   Procedure: LEFT ELBOW BURSECTOMY EXCISION MASS;  Surgeon: Iran Planas, MD;  Location: [facility];  Service: Orthopedics;  Laterality: Left;  . OLECRANON BURSECTOMY  07/09/2014  . PTCA    . THYROIDECTOMY     partial   HPI:  78 y.o. male  with past medical history of end stage COPD, CAD, CABG x 5, atrial fibrillation s/p cardioversion, dementia and anxiety who was admitted on 03/22/2019 with SOB.  He was found to have pneumonia and was COVID positive.  He had a recent hospital admission earlier in December for COPD and a syncopal episode.      Assessment / Plan / Recommendation Clinical Impression  Pt appears to be at risk for aspiration  and inadequate nutritional intake given respiratory and cognitive status. He is lethargic this morning, nodding his head "yes" that he wants water but not opening his eyes or following commands well. When presented with POs he mostly blows, rather than sucking or sealing his lips to get anything in his mouth. The only way I got some liquids in his oral cavity was via Total A, siphoning via straw. Pt took several sips of water this way with only one cough noted. He did seem to consistently swallow given an oral stimulus, but it was challenging to get him to accept POs and he remains at risk for aspiration with his mentation so impaired. I suspect that he fluctuates  throughout the day as well - tried to reach RN to discuss how his intake has been, although per RD note it sounds like he has not been taking in a lot.   Per palliative care note, pt's son would not want aggressive measures. He is not a good candidate for MBS given his poor intake, and it sounds like son may not want to pursue alternative feeding (SLP did NOT discussed with him at this time though). Therefore, I suspect that allowing him to maintain a PO diet with known risk, using safe feeding strategies and aspiration precautions to reduce (but not eliminate) risk for aspiration, may be most in line with overall GOC. This warrants further discussion with pt's son and medical team though. Discussed with MD, who is in agreement. SLP will continue to follow as indicated.  SLP Visit Diagnosis: Dysphagia, unspecified (R13.10)    Aspiration Risk  Moderate aspiration risk;Risk for inadequate nutrition/hydration    Diet Recommendation (dependent on [place] - may wish to continue diet with assist)   Liquid Administration via: Spoon;Cup;Straw Medication Administration: Crushed with puree Supervision: Staff to assist with self feeding;Full supervision/cueing for compensatory strategies Compensations: Minimize environmental distractions;Slow rate;Small sips/bites Postural Changes: Seated upright at 90 degrees;Remain upright for at least 30 minutes after po intake    Other  Recommendations Oral Care Recommendations: Oral care QID Other Recommendations: Have oral suction available   Follow up Recommendations Skilled Nursing facility      Frequency and Duration min 2x/week  2 weeks       Prognosis Prognosis for Safe Diet Advancement: Fair Barriers to Reach Goals: Cognitive deficits      Swallow Study   General HPI: 78 y.o. male  with past medical history of end stage COPD, CAD, CABG x 5, atrial fibrillation s/p cardioversion, dementia and anxiety who was admitted on 03/16/2019 with SOB.  He was  found to have pneumonia and was COVID positive.  He had a recent hospital admission earlier in December for COPD and a syncopal episode.    Type of Study: Bedside Swallow Evaluation Previous Swallow Assessment: none in chart Diet Prior to this Study: Regular;Thin liquids Temperature Spikes Noted: No Respiratory Status: Nasal cannula;Non-rebreather History of Recent Intubation: No Behavior/Cognition: Lethargic/Drowsy;Requires cueing Oral Cavity Assessment: (limited visibility) Oral Care Completed by SLP: No Oral Cavity - Dentition: Adequate natural dentition Vision: (keeps eyes closed) Self-Feeding Abilities: Total assist Patient Positioning: Upright in bed Baseline Vocal Quality: Normal    Oral/Motor/Sensory Function Overall Oral Motor/Sensory Function: (not following commands, seems symmetrical)   Ice Chips Ice chips: Not tested   Thin Liquid Thin Liquid: Impaired Presentation: Spoon;Straw Oral Phase Impairments: Reduced labial seal;Poor awareness of bolus Pharyngeal  Phase Impairments: Suspected delayed Swallow;Cough - Immediate    Nectar Thick Nectar Thick Liquid: Impaired Presentation: Spoon  Honey Thick Honey Thick Liquid: Not tested   Puree Puree: Not tested   Solid     Solid: Not tested      Venita Sheffield Briggs Edelen 03/18/2019,9:04 AM  Pollyann Glen, M.A. [place] Acute Environmental education officer [phone] Office [phone]

## 2019-03-19 DIAGNOSIS — F0391 Unspecified dementia with behavioral disturbance: Secondary | ICD-10-CM

## 2019-03-19 DIAGNOSIS — Z515 Encounter for palliative care: Secondary | ICD-10-CM

## 2019-03-19 DIAGNOSIS — R0600 Dyspnea, unspecified: Secondary | ICD-10-CM

## 2019-03-19 DIAGNOSIS — I4891 Unspecified atrial fibrillation: Secondary | ICD-10-CM

## 2019-03-19 LAB — CULTURE, BLOOD (ROUTINE X 2)
Culture: NO GROWTH
Culture: NO GROWTH
Special Requests: ADEQUATE

## 2019-03-19 LAB — GLUCOSE, CAPILLARY
Glucose-Capillary: 283 mg/dL — ABNORMAL HIGH (ref 70–99)
Glucose-Capillary: 218 mg/dL — ABNORMAL HIGH (ref 70–99)
Glucose-Capillary: 258 mg/dL — ABNORMAL HIGH (ref 70–99)

## 2019-03-19 LAB — LEGIONELLA PNEUMOPHILA SEROGP 1 UR AG: L. pneumophila Serogp 1 Ur Ag: NEGATIVE

## 2019-03-19 MED ORDER — LORAZEPAM 2 MG/ML IJ SOLN
1.0000 mg | INTRAMUSCULAR | Status: DC | PRN
  Administered 2019-03-19: 09:00:00 1 mg via INTRAVENOUS

## 2019-03-19 MED ORDER — ONDANSETRON 4 MG PO TBDP: 4.0000 mg | ORAL_TABLET | Freq: Four times a day (QID) | ORAL | Status: DC | PRN

## 2019-03-19 MED ORDER — BISACODYL 10 MG RE SUPP: 10.0000 mg | Freq: Every day | RECTAL | Status: DC | PRN

## 2019-03-19 MED ORDER — GLYCOPYRROLATE 0.2 MG/ML IJ SOLN: 0.2000 mg | INTRAMUSCULAR | Status: DC | PRN

## 2019-03-19 MED ORDER — MORPHINE SULFATE (PF) 2 MG/ML IV SOLN
2.0000 mg | INTRAVENOUS | Status: AC
  Administered 2019-03-19: 10:00:00 2 mg via INTRAVENOUS

## 2019-03-19 MED ORDER — MORPHINE 100MG IN NS 100ML (1MG/ML) PREMIX INFUSION
1.0000 mg/h | INTRAVENOUS | Status: DC
  Administered 2019-03-19: 10:00:00 1 mg/h via INTRAVENOUS
  Administered 2019-03-20: 3 mg/h via INTRAVENOUS
  Filled 2019-03-19 (×3): qty 100

## 2019-03-19 MED ORDER — FLEET ENEMA 7-19 GM/118ML RE ENEM
1.0000 | ENEMA | Freq: Every day | RECTAL | Status: DC | PRN
  Filled 2019-03-19: qty 1

## 2019-03-19 MED ORDER — GLYCOPYRROLATE 1 MG PO TABS
1.0000 mg | ORAL_TABLET | ORAL | Status: DC | PRN
  Filled 2019-03-19: qty 1

## 2019-03-19 MED ORDER — DIPHENHYDRAMINE HCL 50 MG/ML IJ SOLN: 12.5000 mg | INTRAMUSCULAR | Status: DC | PRN

## 2019-03-19 MED ORDER — MORPHINE BOLUS VIA INFUSION
2.0000 mg | INTRAVENOUS | Status: DC | PRN
  Administered 2019-03-20 (×3): 2 mg via INTRAVENOUS
  Administered 2019-03-21: 3 mg via INTRAVENOUS
  Filled 2019-03-19: qty 4

## 2019-03-19 MED ORDER — ONDANSETRON HCL 4 MG/2ML IJ SOLN: 4.0000 mg | Freq: Four times a day (QID) | INTRAMUSCULAR | Status: DC | PRN

## 2019-03-19 MED ORDER — MORPHINE 100MG IN NS 100ML (1MG/ML) PREMIX INFUSION: 1.0000 mg/h | INTRAVENOUS | Status: DC

## 2019-03-19 NOTE — Progress Notes (Signed)
Daily Progress Note   Patient Name: [name]       Date: 03/19/2019 DOB: 28-Mar-1941  Age: 78 y.o. MRN#: [id number] Attending Physician: Cherene Altes, MD Primary Care Physician: Reubin Milan, MD Admit Date: 03/10/2019  Reason for Consultation/Follow-up: Establishing goals of care  Subjective: Per Dr. Thereasa Solo, patient declining and struggling to breath this morning. Dr. Thereasa Solo spoke with son, Boy and decision made to transition to comfort measures only. Morphine gtt initiated.   Spoke with bedside RN, who reports that patient appears comfortable on morphine infusion. Remains on NRB mask.   GOC:  F/u with son, Devaris Gillott via telephone. He confirms his conversation with Dr. Thereasa Solo this morning and the decision to transition to comfort measures. "Make him as comfortable as possible." Osbaldo understands diagnoses and poor prognosis "just a matter of time." Prepared him for hours-days prognosis and for 'anything to happen at any time' especially once we start weaning down oxygen. Educated on comfort measures and symptom management medications. Educated on EOL expectations. Haris understand his father will pass away in the hospital. Yeeleng and his brother, [name] do not wish to visit. [name] facetimed his father yesterday and this was hard for him to see his father in this state.  Answered all questions. Dwane has PMT contact information and encouraged to call with questions or concerns.   Length of Stay: 5  Current Medications: Scheduled Meds:    Continuous Infusions: . morphine 1 mg/hr (03/19/19 0949)    PRN Meds: [DISCONTINUED] acetaminophen **OR** acetaminophen, albuterol, bisacodyl, diphenhydrAMINE, glycopyrrolate **OR** glycopyrrolate **OR** glycopyrrolate,  guaiFENesin-dextromethorphan, haloperidol lactate, LORazepam, morphine, ondansetron **OR** ondansetron (ZOFRAN) IV, sodium phosphate  Physical Exam Vitals and nursing note reviewed.            Vital Signs: BP (!) 155/98   Pulse (!) 104   Temp 97.9 F (36.6 C) (Axillary)   Resp 20   Ht 5\' 5"  (1.651 m)   Wt 54.5 kg   SpO2 96%   BMI 20.01 kg/m  SpO2: SpO2: 96 % O2 Device: O2 Device: NRB O2 Flow Rate: O2 Flow Rate (L/min): 15 L/min  Intake/output summary:  No intake or output data in the 24 hours ending 03/19/19 1105 LBM:   Baseline Weight: Weight: 60.8 kg Most recent weight: Weight: 54.5 kg  Palliative Assessment/Data: PPS 20%      Patient Active Problem List   Diagnosis Date Noted  . Palliative care by specialist   . Goals of care, counseling/discussion   . Dementia with behavioral disturbance ([place])   . COVID-19 03/16/2019  . Alzheimer's dementia without behavioral disturbance ([place])   . Pneumonia due to COVID-19 virus 03/15/2019  . Respiratory failure ([place]) 03/18/2019  . Healthcare-associated pneumonia 03/26/2019  . Chronic respiratory failure with hypoxia ([place]) 02/25/2019  . Pulmonary hypertension ([place]) 05/13/2018  . Acute delirium   . Shortness of breath 05/11/2018  . Acute kidney injury ([place]) 05/10/2018  . Hematuria, microscopic 05/10/2018  . Dehydration 05/10/2018  . Abdominal aortic aneurysm ([place]) 05/10/2018  . Lung nodule, solitary   . Iron deficiency anemia 02/15/2015  . CAD (coronary artery disease) 09/18/2014  . COPD (chronic obstructive pulmonary disease) ([place]) 08/23/2014  . Chronic atrial fibrillation 08/23/2014  . Diabetes mellitus type II, controlled ([place]) 07/01/2014  . Acute on chronic respiratory failure with hypoxia ([place]) 05/14/2014  . COPD exacerbation ([place])   . Chronic respiratory failure with hypercapnia ([place]) 05/10/2014  . CAP (community acquired pneumonia) 04/03/2014  . Allergic rhinitis 02/06/2014  . GERD (gastroesophageal reflux  disease) 02/06/2014  . Do not resuscitate 02/06/2014  . Erectile dysfunction 06/18/2010  . Atrial fibrillation ([place]) 11/06/2009  . Peripheral vascular disease ([place]) 09/02/2009  . Carotid artery stenosis 04/16/2009  . ANEMIA, B12 DEFICIENCY 12/02/2008  . TOBACCO USE 01/19/2007  . Hyperlipidemia 11/21/2006  . Essential hypertension 11/21/2006  . Hx of CABG 11/21/2006  . Osteoporosis 11/21/2006    Palliative Care Assessment & Plan   Patient Profile: 78 y.o. male  with past medical history of end stage COPD, CAD, CABG x 5, atrial fibrillation s/p cardioversion, dementia and anxiety who was admitted on 02/27/2019 with SOB.  He was found to have pneumonia and was COVID positive.  He had a recent hospital admission earlier in December for COPD and a syncopal episode.     Assessment: Acute on chronic respiratory failure COVID pneumonia Severe COPD Paroxysmal a.fib Chronic diastolic heart failure Elevated D-dimer Dementia Acute delirium  Generalized deconditioning   Recommendations/Plan:  Clinical decline. Son ready for transition to comfort measures only, understanding diagnoses and poor prognosis. Son understands his father will likely pass in the hospital.   Symptom management medications per Dr. Thereasa Solo  RN initiated Morphine infusion 1mg /hr  RN may bolus via infusion 2-4mg  q26min prn pain/dyspnea/air hunger/tachypnea  Robinul 0.2mg  IV q4h prn secretions  Ativan 1-2mg  IV q4h prn anxiety  Haldol l 1-2mg  IV q6h prn agitation  RN to start weaning down oxygen once patient comfortable on morphine infusion. Treat dyspnea with prn morphine boluses.   Anticipate hospital death.     Code Status: DNR   Code Status Orders  (From admission, onward)         Start     Ordered   03/25/2019 1806  Do not attempt resuscitation (DNR)  Continuous    Question Answer Comment  In the event of cardiac or respiratory ARREST Do not call a "code blue"   In the event of cardiac or  respiratory ARREST Do not perform Intubation, CPR, defibrillation or ACLS   In the event of cardiac or respiratory ARREST Use medication by any route, position, wound care, and other measures to relive pain and suffering. May use oxygen, suction and manual treatment of airway obstruction as needed for comfort.      03/20/2019 1812  Code Status History    Date Active Date Inactive Code Status Order ID Comments User Context   02/25/2019 1318 03/05/2019 1431 DNR [id number]  Mckinley Jewel, MD ED   05/10/2018 1407 05/14/2018 1843 DNR [id number]  Bonnita Hollow, MD ED   03/15/2018 1749 03/19/2018 1726 DNR [id number]  Kayleen Memos, DO ED   08/04/2017 0253 08/06/2017 1831 DNR [id number]  Rise Patience, MD Inpatient   06/10/2016 1742 06/14/2016 1615 DNR [id number]  Riccardo Dubin, MD Inpatient   02/23/2016 0051 02/25/2016 1601 DNR [id number]  Ivor Costa, MD ED   02/14/2015 1405 02/19/2015 1549 DNR [id number]  Dellia Nims, MD Inpatient   02/14/2015 1238 02/14/2015 1405 DNR [id number]  Fredia Sorrow, MD ED   08/27/2014 1150 08/29/2014 1518 DNR [id number]  Marijean Heath, NP Inpatient   08/23/2014 0702 08/27/2014 1150 Full Code [id number]  Rise Patience, MD Inpatient   07/09/2014 2101 07/10/2014 1510 Full Code [id number]  Iran Planas, MD Inpatient   05/10/2014 1244 05/16/2014 1608 DNR [id number]  Karen Kitchens Inpatient   Advance Care Planning Activity    Advance Directive Documentation     Most Recent Value  Type of Advance Directive  Healthcare Power of Attorney, Living will  Pre-existing out of facility DNR order (yellow form or pink MOST form)  --  "MOST" Form in Place?  --       Prognosis:  Poor prognosis likely hours-days  Discharge Planning:  Anticipated Hospital Death   Care plan was discussed with RN, son Lawrance), Dr. Thereasa Solo via epic secure chat  Thank you for allowing the Palliative Medicine Team to assist in the care of this patient.  The above  conversation was completed via telephone due to visitor restrictions during COVID-19 pandemic. Thorough chart review and discussion with multidisciplinary team was completed as part of assessment. No physical examination was performed.    Time In: 1050 Time Out: 1110 Total Time 20 Prolonged Time Billed  no      Greater than 50%  of this time was spent counseling and coordinating care related to the above assessment and plan.  Ihor Dow, DNP, FNP-C Palliative Medicine Team  Phone: [phone] Fax: [phone]  Please contact Palliative Medicine Team phone at [phone] for questions and concerns.

## 2019-03-19 NOTE — Progress Notes (Signed)
[name]  [id number] DOB: May 18, 1940 DOA: 03/27/2019 PCP: Reubin Milan, MD    Brief Narrative:  78yo with a history of COPD with chronic hypoxic respiratory failure requiring 2-4 L of O2, HTN, CAD status post CABG, HLD, paroxysmal atrial fibrillation, and a recent admission/discharge 2 weeks prior due to a severe COPD exacerbation and fluid overload who returned to the hospital with worsening severe SOB which has been building over a week's time.  In the ED a CXR raised concern for a pneumonia. He was found to be COVID+.   Significant Events: 11/30 > 12/8 COPD exac + fluid overload  12/17 admit via [place] w/ SOB   COVID-19 specific Treatment: Remdesivir 12/18 > Solu-Medrol 12/17 >   Date of Positive COVID Test: 03/28/2019 (was negative 02/25/2019)  Antimicrobials:  Cefepime 12/17 > Vancomycin 12/17  Subjective: The pt is in a clear state of decline, and struggling to breath. I have spoken to his son Nassir Quintin, [name] who understands and is in full support of comfort focused measures.  At the time of my f/u visit this afternoon Mr. Statzer is resting very comfortably after initiation of his comfort measures.    Assessment & Plan:  Acute on chronic hypoxic respiratory failure - COVID Pneumonia - chronic severe COPD Chronically requires 2-4 L of oxygen support at home - given his ongoing decline, and clear discomfort/distress, the decision has been made to transition to comfort focused care (as discussed above)  Markedly elevated D-dimer Was being empirically anticoagulated - high likelihood of pulmonary embolism given active COVID and recent preceding hospitalization w/ signif imobility   Paroxysmal A. Fib  not on anticoagulation as an outpatient   Chronic diastolic heart failure TTE 08/06/2017 noted EF of 60-65% - minimal to no intake  Filed Weights   03/16/19 0500 03/17/19 0426 03/18/19 0332  Weight: 60 kg 60 kg 54.5 kg    Mildly elevated troponin Likely  nonspecific - would not be a candidate for aggressive intervention   Dementia / anxiety - acute delirium/sundowning  haldol prn agitation - ativan prn anxiety   GERD  Hyperlipidemia  Generalized deconditioning Transitioned to comfort focused care   DVT prophylaxis: full dose lovenox  Code Status: DNR - NO CODE - COMFORT CARE  Family Communication:  Disposition Plan: anticipate hospital death   Consultants:  none  Objective: Blood pressure (!) 155/98, pulse (!) 104, temperature 97.9 F (36.6 C), temperature source Axillary, resp. rate 20, height 5\' 5"  (1.651 m), weight 54.5 kg, SpO2 96 %.  Intake/Output Summary (Last 24 hours) at 03/19/2019 1652 Last data filed at 03/19/2019 0800 Gross per 24 hour  Intake --  Output 150 ml  Net -150 ml   Filed Weights   03/16/19 0500 03/17/19 0426 03/18/19 0332  Weight: 60 kg 60 kg 54.5 kg    Examination: Resting comfortably in bed on low dose morphine drip - no evidence of resp distress or discomfort - no wheezing - no apparent anxiety    CBC: Recent Labs  Lab 03/16/19 0344 03/17/19 0419 03/18/19 0441  WBC 11.9* 9.8 11.2*  NEUTROABS 11.3* 9.3* 10.6*  HGB 14.1 13.2 13.9  HCT 42.2 40.3 42.5  MCV 99.5 101.3* 100.7*  PLT 82* 64* 60*   Basic Metabolic Panel: Recent Labs  Lab 03/16/19 0344 03/17/19 0419 03/18/19 0441  NA 142 142 149*  K 4.3 3.9 4.0  CL 105 103 111  CO2 23 26 26   GLUCOSE 252* 312* 224*  BUN 34* 40*  41*  CREATININE 1.19 1.14 1.01  CALCIUM 8.4* 8.1* 8.2*  MG 1.7 1.7 1.7   GFR: Estimated Creatinine Clearance: 46.5 mL/min (by C-G formula based on SCr of 1.01 mg/dL).  Liver Function Tests: Recent Labs  Lab 03/15/19 0353 03/16/19 0344 03/17/19 0419 03/18/19 0441  AST 29 29 27 23   ALT 25 24 26 24   ALKPHOS 42 49 54 72  BILITOT 1.0 0.9 0.9 1.8*  PROT 5.6* 5.7* 5.3* 5.2*  ALBUMIN 2.5* 2.6* 2.6* 2.6*    HbA1C: Hgb A1c MFr Bld  Date/Time Value Ref Range Status  02/25/2019 03:30 PM 6.7 (H) 4.8  - 5.6 % Final    Comment:    (NOTE) Pre diabetes:          5.7%-6.4% Diabetes:              >6.4% Glycemic control for   <7.0% adults with diabetes   05/10/2018 03:47 PM 7.6 (H) 4.8 - 5.6 % Final    Comment:    (NOTE)         Prediabetes: 5.7 - 6.4         Diabetes: >6.4         Glycemic control for adults with diabetes: <7.0     CBG: Recent Labs  Lab 03/18/19 1134 03/18/19 1605 03/18/19 2252 03/19/19 0758 03/19/19 1122  GLUCAP 195* 327* 283* 218* 258*    Recent Results (from the past 240 hour(s))  Blood Culture (routine x 2)     Status: None   Collection Time: 03/08/2019  4:00 PM   Specimen: BLOOD RIGHT ARM  Result Value Ref Range Status   Specimen Description BLOOD RIGHT ARM  Final   Special Requests   Final    BOTTLES DRAWN AEROBIC AND ANAEROBIC Blood Culture adequate volume   Culture   Final    NO GROWTH 5 DAYS Performed at [facility], [place] [address] [place], [place] [address]    Report Status 03/19/2019 FINAL  Final  Blood Culture (routine x 2)     Status: None   Collection Time: 02/28/2019  4:10 PM   Specimen: BLOOD RIGHT HAND  Result Value Ref Range Status   Specimen Description BLOOD RIGHT HAND  Final   Special Requests   Final    BOTTLES DRAWN AEROBIC ONLY Blood Culture results may not be optimal due to an inadequate volume of blood received in culture bottles   Culture   Final    NO GROWTH 5 DAYS Performed at [facility], [place] [address] [place], [place] [address]    Report Status 03/19/2019 FINAL  Final  Respiratory Panel by PCR     Status: None   Collection Time: 03/20/2019 10:06 PM   Specimen: Nasopharyngeal Swab; Respiratory  Result Value Ref Range Status   Adenovirus NOT DETECTED NOT DETECTED Final   Coronavirus 229E NOT DETECTED NOT DETECTED Final    Comment: (NOTE) The Coronavirus on the Respiratory Panel, DOES NOT test for the novel  Coronavirus (2019 nCoV)    Coronavirus HKU1 NOT DETECTED NOT DETECTED Final   Coronavirus  NL63 NOT DETECTED NOT DETECTED Final   Coronavirus OC43 NOT DETECTED NOT DETECTED Final   Metapneumovirus NOT DETECTED NOT DETECTED Final   Rhinovirus / Enterovirus NOT DETECTED NOT DETECTED Final   Influenza A NOT DETECTED NOT DETECTED Final   Influenza B NOT DETECTED NOT DETECTED Final   Parainfluenza Virus 1 NOT DETECTED NOT DETECTED Final   Parainfluenza Virus 2 NOT  DETECTED NOT DETECTED Final   Parainfluenza Virus 3 NOT DETECTED NOT DETECTED Final   Parainfluenza Virus 4 NOT DETECTED NOT DETECTED Final   Respiratory Syncytial Virus NOT DETECTED NOT DETECTED Final   Bordetella pertussis NOT DETECTED NOT DETECTED Final   Chlamydophila pneumoniae NOT DETECTED NOT DETECTED Final   Mycoplasma pneumoniae NOT DETECTED NOT DETECTED Final    Comment: Performed at [facility], [place] [address], [place], [place] [address]  Surgical pcr screen     Status: None   Collection Time: 03/16/2019 10:06 PM   Specimen: Nasopharyngeal Swab; Nasal Swab  Result Value Ref Range Status   MRSA, PCR NEGATIVE NEGATIVE Final   Staphylococcus aureus NEGATIVE NEGATIVE Final    Comment: (NOTE) The Xpert SA Assay (FDA approved for NASAL specimens in patients 21 years of age and older), is one component of a comprehensive surveillance program. It is not intended to diagnose infection nor to guide or monitor treatment. Performed at [facility], [place] [address], [place], [place] [address]      Continuous Infusions: . morphine 1 mg/hr (03/19/19 0949)     LOS: 5 days   Cherene Altes, MD [facility] Office  [phone] Pager - Text Page per Amion  If 7PM-7AM, please contact night-coverage per Amion 03/19/2019, 4:52 PM

## 2019-03-19 NOTE — Progress Notes (Signed)
Family Communication  Primary Nurse spoke with both patients sons throughout the shift to update them on their fathers condition and comfort care measures. Also Facetime with patients grandaughter Hildred Alamin so she could say her final farewells.

## 2019-03-19 NOTE — Plan of Care (Signed)
  Problem: Clinical Measurements: Goal: Will remain free from infection Outcome: Progressing Goal: Diagnostic test results will improve Outcome: Progressing Goal: Respiratory complications will improve Outcome: Progressing Goal: Cardiovascular complication will be avoided Outcome: Progressing   Problem: Elimination: Goal: Will not experience complications related to bowel motility Outcome: Progressing Goal: Will not experience complications related to urinary retention Outcome: Progressing   Problem: Pain Managment: Goal: General experience of comfort will improve Outcome: Progressing   Problem: Safety: Goal: Ability to remain free from injury will improve Outcome: Progressing   Problem: Skin Integrity: Goal: Risk for impaired skin integrity will decrease Outcome: Progressing   Problem: Respiratory: Goal: Will maintain a patent airway Outcome: Progressing Goal: Complications related to the disease process, condition or treatment will be avoided or minimized Outcome: Progressing

## 2019-03-19 NOTE — Progress Notes (Signed)
Morphine Gtt initation  Primary RN started Morphine gtt 100mg /113ml at initial rate of 1mg /ml/hr at [phone] for end of life comfort. 2nd nurse Karlyn Agee RN at bedside with primary nurse to verify and witness Morphine gtt initiation.

## 2019-03-20 MED ORDER — CHLORHEXIDINE GLUCONATE CLOTH 2 % EX PADS
6.0000 | MEDICATED_PAD | Freq: Every day | CUTANEOUS | Status: DC
  Administered 2019-03-20 – 2019-03-21 (×2): 6 via TOPICAL

## 2019-03-20 NOTE — Plan of Care (Addendum)
RN updated son, Blaire. All questions answered. Pt weened from 16L NRB to 6L [place] satting 97-88%; will continue to ween. Pt on morphine drip with bolus doses given as needed.   Problem: Coping: Goal: Level of anxiety will decrease 03/20/2019 1134 by Burnetta Sabin, RN Outcome: Progressing 03/20/2019 1134 by Burnetta Sabin, RN Outcome: Not Progressing   Problem: Elimination: Goal: Will not experience complications related to urinary retention 03/20/2019 1134 by Burnetta Sabin, RN Outcome: Progressing 03/20/2019 1134 by Burnetta Sabin, RN Outcome: Not Progressing   Problem: Pain Managment: Goal: General experience of comfort will improve 03/20/2019 1134 by Burnetta Sabin, RN Outcome: Progressing 03/20/2019 1134 by Burnetta Sabin, RN Outcome: Not Progressing   Problem: Safety: Goal: Ability to remain free from injury will improve 03/20/2019 1134 by Burnetta Sabin, RN Outcome: Progressing 03/20/2019 1134 by Burnetta Sabin, RN Outcome: Not Progressing   Problem: Respiratory: Goal: Will maintain a patent airway 03/20/2019 1134 by Burnetta Sabin, RN Outcome: Progressing 03/20/2019 1134 by Burnetta Sabin, RN Outcome: Not Progressing   Problem: Education: Goal: Knowledge of General Education information will improve Description: Including pain rating scale, medication(s)/side effects and non-pharmacologic comfort measures Outcome: Not Progressing   Problem: Health Behavior/Discharge Planning: Goal: Ability to manage health-related needs will improve Outcome: Not Progressing   Problem: Clinical Measurements: Goal: Ability to maintain clinical measurements within normal limits will improve Outcome: Not Progressing Goal: Will remain free from infection Outcome: Not Progressing Goal: Diagnostic test results will improve Outcome: Not Progressing Goal: Respiratory complications will improve Outcome: Not Progressing Goal: Cardiovascular complication  will be avoided Outcome: Not Progressing   Problem: Activity: Goal: Risk for activity intolerance will decrease Outcome: Not Progressing   Problem: Nutrition: Goal: Adequate nutrition will be maintained Outcome: Not Progressing   Problem: Elimination: Goal: Will not experience complications related to bowel motility Outcome: Not Progressing   Problem: Skin Integrity: Goal: Risk for impaired skin integrity will decrease Outcome: Not Progressing   Problem: Education: Goal: Knowledge of risk factors and measures for prevention of condition will improve Outcome: Not Progressing   Problem: Coping: Goal: Psychosocial and spiritual needs will be supported Outcome: Not Progressing   Problem: Respiratory: Goal: Complications related to the disease process, condition or treatment will be avoided or minimized Outcome: Not Progressing

## 2019-03-20 NOTE — Progress Notes (Signed)
Nutrition Brief Note  Chart reviewed. Patient has transitioned to comfort care.  No further nutrition interventions warranted at this time.  Please re-consult as needed.   Tameka Hoiland, RD, LDN, CNSC Pager [phone] After Hours Pager [phone]    

## 2019-03-20 NOTE — Progress Notes (Signed)
Daily Progress Note   Patient Name: [name]       Date: 03/20/2019 DOB: [birth year]  Age: 78 y.o. MRN#: [id number] Attending Physician: Annita Brod, MD Primary Care Physician: Reubin Milan, MD  Admit Date: 03/05/2019  Reason for Consultation/Follow-up: Establishing goals of care  Subjective: Patient comfortable on morphine infusion. Remains unresponsive but RN reports no s/s of pain or discomfort.   GOC:  Discussed comfort focused care plan with RN including weaning off of NRB mask to nasal cannula in order to not prolong this process for patient. Rn to given morphine bolus via infusion and discontinue NRB mask.   Spoke with son, Xaden Ritzman and provided updated on plan of care. Caylon agrees to discontinue NRB mask and thought this was done yesterday. Reassured Sabin that his father remains comfortable on morphine infusion. Discussed symptom management medications. Kobyn has PMT contact information and understands he can call with questions or concerns.   Length of Stay: 6  Current Medications: Scheduled Meds:    Continuous Infusions: . morphine 3 mg/hr (03/20/19 0237)    PRN Meds: [DISCONTINUED] acetaminophen **OR** acetaminophen, albuterol, bisacodyl, diphenhydrAMINE, glycopyrrolate **OR** glycopyrrolate **OR** glycopyrrolate, guaiFENesin-dextromethorphan, haloperidol lactate, LORazepam, morphine, ondansetron **OR** ondansetron (ZOFRAN) IV, sodium phosphate  Physical Exam Vitals and nursing note reviewed.            Vital Signs: BP 119/81 (BP Location: Right Arm)   Pulse (!) 120   Temp 100.2 F (37.9 C) (Axillary)   Resp 18   Ht 5\' 5"  (1.651 m)   Wt 54.5 kg   SpO2 97%   BMI 20.01 kg/m  SpO2: SpO2: 97 % O2 Device: O2 Device: NRB O2 Flow Rate: O2 Flow Rate  (L/min): 15 L/min  Intake/output summary:   Intake/Output Summary (Last 24 hours) at 03/20/2019 [id number] Last data filed at 03/20/2019 0500 Gross per 24 hour  Intake 275.91 ml  Output 610 ml  Net -334.09 ml   LBM: Last BM Date: 03/19/19(incontinent) Baseline Weight: Weight: 60.8 kg Most recent weight: Weight: 54.5 kg       Palliative Assessment/Data: PPS 10%      Patient Active Problem List   Diagnosis Date Noted  . Palliative care by specialist   . Terminal care   . Dementia with behavioral disturbance ([place])   . COVID-19 03/16/2019  .  Alzheimer's dementia without behavioral disturbance ([place])   . Pneumonia due to COVID-19 virus 03/15/2019  . Respiratory failure ([place]) 03/13/2019  . Healthcare-associated pneumonia 03/07/2019  . Chronic respiratory failure with hypoxia ([place]) 02/25/2019  . Pulmonary hypertension ([place]) 05/13/2018  . Acute delirium   . Dyspnea 05/11/2018  . Acute kidney injury ([place]) 05/10/2018  . Hematuria, microscopic 05/10/2018  . Dehydration 05/10/2018  . Abdominal aortic aneurysm ([place]) 05/10/2018  . Lung nodule, solitary   . Iron deficiency anemia 02/15/2015  . CAD (coronary artery disease) 09/18/2014  . COPD (chronic obstructive pulmonary disease) ([place]) 08/23/2014  . Chronic atrial fibrillation 08/23/2014  . Diabetes mellitus type II, controlled ([place]) 07/01/2014  . Acute on chronic respiratory failure with hypoxia ([place]) 05/14/2014  . COPD exacerbation ([place])   . Chronic respiratory failure with hypercapnia ([place]) 05/10/2014  . CAP (community acquired pneumonia) 04/03/2014  . Allergic rhinitis 02/06/2014  . GERD (gastroesophageal reflux disease) 02/06/2014  . Do not resuscitate 02/06/2014  . Erectile dysfunction 06/18/2010  . Atrial fibrillation ([place]) 11/06/2009  . Peripheral vascular disease ([place]) 09/02/2009  . Carotid artery stenosis 04/16/2009  . ANEMIA, B12 DEFICIENCY 12/02/2008  . TOBACCO USE 01/19/2007  . Hyperlipidemia 11/21/2006  .  Essential hypertension 11/21/2006  . Hx of CABG 11/21/2006  . Osteoporosis 11/21/2006    Palliative Care Assessment & Plan   Patient Profile: 78 y.o. male  with past medical history of end stage COPD, CAD, CABG x 5, atrial fibrillation s/p cardioversion, dementia and anxiety who was admitted on 03/04/2019 with SOB.  He was found to have pneumonia and was COVID positive.  He had a recent hospital admission earlier in December for COPD and a syncopal episode.     Assessment: Acute on chronic respiratory failure COVID pneumonia Severe COPD Paroxysmal a.fib Chronic diastolic heart failure Elevated D-dimer Dementia Acute delirium  Generalized deconditioning   Recommendations/Plan:  Clinical decline. Son ready for transition to comfort measures only on 03/19/19, understanding diagnoses and poor prognosis. Son understands his father will likely pass in the hospital.   Symptom management medications  Continuous morphine infusion.  RN may bolus via infusion 2-4mg  q25min prn pain/dyspnea/air hunger/tachypnea  Robinul 0.2mg  IV q4h prn secretions  Ativan 1-2mg  IV q4h prn anxiety   Haldol 1-2mg  IV q6h prn agitation  RN to give morphine bolus via infusion and discontinue NRB mask.   Anticipate hospital death.     Code Status: DNR   Code Status Orders  (From admission, onward)         Start     Ordered   03/07/2019 1806  Do not attempt resuscitation (DNR)  Continuous    Question Answer Comment  In the event of cardiac or respiratory ARREST Do not call a "code blue"   In the event of cardiac or respiratory ARREST Do not perform Intubation, CPR, defibrillation or ACLS   In the event of cardiac or respiratory ARREST Use medication by any route, position, wound care, and other measures to relive pain and suffering. May use oxygen, suction and manual treatment of airway obstruction as needed for comfort.      03/27/2019 1812        Code Status History    Date Active Date  Inactive Code Status Order ID Comments User Context   02/25/2019 1318 03/05/2019 1431 DNR [id number]  Mckinley Jewel, MD ED   05/10/2018 1407 05/14/2018 1843 DNR [id number]  Bonnita Hollow, MD ED   03/15/2018 1749 03/19/2018 1726 DNR [id number]  [place] Crane,  Lorenda Cahill, DO ED   08/04/2017 0253 08/06/2017 1831 DNR [id number]  Rise Patience, MD Inpatient   06/10/2016 1742 06/14/2016 1615 DNR [id number]  Riccardo Dubin, MD Inpatient   02/23/2016 0051 02/25/2016 1601 DNR [id number]  Ivor Costa, MD ED   02/14/2015 1405 02/19/2015 1549 DNR [id number]  Dellia Nims, MD Inpatient   02/14/2015 1238 02/14/2015 1405 DNR [id number]  Fredia Sorrow, MD ED   08/27/2014 1150 08/29/2014 1518 DNR [id number]  Marijean Heath, NP Inpatient   08/23/2014 0702 08/27/2014 1150 Full Code [id number]  Rise Patience, MD Inpatient   07/09/2014 2101 07/10/2014 1510 Full Code [id number]  Iran Planas, MD Inpatient   05/10/2014 1244 05/16/2014 1608 DNR [id number]  Karen Kitchens Inpatient   Advance Care Planning Activity    Advance Directive Documentation     Most Recent Value  Type of Advance Directive  Healthcare Power of Attorney, Living will  Pre-existing out of facility DNR order (yellow form or pink MOST form)  --  "MOST" Form in Place?  --       Prognosis:  Poor prognosis likely hours-days  Discharge Planning:  Anticipated Hospital Death   Care plan was discussed with RN, son Clingerman)   Thank you for allowing the Palliative Medicine Team to assist in the care of this patient.  The above conversation was completed via telephone due to visitor restrictions during COVID-19 pandemic. Thorough chart review and discussion with multidisciplinary team was completed as part of assessment. No physical examination was performed.    Time In: 0905 Time Out: 0920 Total Time 15 Prolonged Time Billed  no      Greater than 50%  of this time was spent counseling and coordinating care related to the above assessment and  plan.  Ihor Dow, DNP, FNP-C Palliative Medicine Team  Phone: [phone] Fax: [phone]  Please contact Palliative Medicine Team phone at [phone] for questions and concerns.

## 2019-03-20 NOTE — Progress Notes (Signed)
PROGRESS NOTE  [name] [id number] DOB: 24-Mar-1941 DOA: 03/28/2019 PCP: Reubin Milan, MD  HPI/Recap of past 43 hours: 78 year old male with past medical history of chronic respiratory failure requiring 3 to 4 L of oxygen from COPD plus hypertension, dementia, paroxysmal A. fib and recent hospitalization from COPD exacerbation plus volume overload admitted on 12/17 for severe hypoxia felt to be secondary to Covid.  Despite aggressive attempts with Remdisivir and Solu-Medrol, patient continued to decline.  After speaking with patient's son, he was made comfort care 12/19.  No changes since.  Patient's respiratory status continues to slowly decline although he appears comfortable.  Assessment/Plan: Principal Problem: Acute on chronic respiratory failure with hypoxia secondary to pneumonia due to COVID-19 virus and patient with history of chronic diastolic heart failure and COPD: Patient initially treated with Remdisivir and steroids.  Now changed to comfort care. Active Problems:   Atrial fibrillation [facility]): Initially on anticoagulation, now comfort care   Diabetes mellitus type II, controlled ([place])   Dementia with behavioral disturbance [facility]): Currently comfortable.  Code Status: DNR, comfort care  Family Communication: Son updated by phone  Disposition Plan: Expect patient should pass in the hospital   Consultants:  Palliative care  Procedures:  None  Antimicrobials:  IV cefepime and vancomycin 12/17-12/19  DVT prophylaxis: Currently comfort care   Objective: Vitals:   03/20/19 0932 03/20/19 1117  BP:    Pulse: (!) 123 64  Resp: 14 14  Temp:    SpO2: 94% (!) 85%    Intake/Output Summary (Last 24 hours) at 03/20/2019 1156 Last data filed at 03/20/2019 0500 Gross per 24 hour  Intake 275.91 ml  Output 610 ml  Net -334.09 ml   Filed Weights   03/16/19 0500 03/17/19 0426 03/18/19 0332  Weight: 60 kg 60 kg 54.5 kg   Body mass index is 20.01 kg/m.   Exam:   General: Appears comfortable, somnolent, no aggressive respirations  Cardiovascular: Tachycardic, irregular  Respiratory: Scattered rails   Data Reviewed: CBC: Recent Labs  Lab 03/07/2019 1620 03/16/2019 1816 03/15/19 0353 03/16/19 0344 03/17/19 0419 03/18/19 0441  WBC 6.6  --  3.2* 11.9* 9.8 11.2*  NEUTROABS 6.1  --   --  11.3* 9.3* 10.6*  HGB 14.8 13.6 14.0 14.1 13.2 13.9  HCT 45.2 40.0 43.4 42.2 40.3 42.5  MCV 101.6*  --  100.9* 99.5 101.3* 100.7*  PLT 90*  --  87* 82* 64* 60*   Basic Metabolic Panel: Recent Labs  Lab 03/01/2019 1620 03/20/2019 1816 03/15/19 0353 03/16/19 0344 03/17/19 0419 03/18/19 0441  NA 140 138 140 142 142 149*  K 4.5 4.5 4.2 4.3 3.9 4.0  CL 101  --  103 105 103 111  CO2 25  --  23 23 26 26   GLUCOSE 150*  --  259* 252* 312* 224*  BUN 20  --  30* 34* 40* 41*  CREATININE 1.42*  --  1.42* 1.19 1.14 1.01  CALCIUM 8.4*  --  8.3* 8.4* 8.1* 8.2*  MG  --   --   --  1.7 1.7 1.7   GFR: Estimated Creatinine Clearance: 46.5 mL/min (by C-G formula based on SCr of 1.01 mg/dL). Liver Function Tests: Recent Labs  Lab 03/27/2019 1620 03/15/19 0353 03/16/19 0344 03/17/19 0419 03/18/19 0441  AST 30 29 29 27 23   ALT 24 25 24 26 24   ALKPHOS 43 42 49 54 72  BILITOT 1.2 1.0 0.9 0.9 1.8*  PROT 6.2* 5.6* 5.7* 5.3* 5.2*  ALBUMIN 2.7* 2.5* 2.6* 2.6* 2.6*   No results for input(s): LIPASE, AMYLASE in the last 168 hours. No results for input(s): AMMONIA in the last 168 hours. Coagulation Profile: No results for input(s): INR, PROTIME in the last 168 hours. Cardiac Enzymes: No results for input(s): CKTOTAL, CKMB, CKMBINDEX, TROPONINI in the last 168 hours. BNP (last 3 results) No results for input(s): PROBNP in the last 8760 hours. HbA1C: No results for input(s): HGBA1C in the last 72 hours. CBG: Recent Labs  Lab 03/18/19 1134 03/18/19 1605 03/18/19 2252 03/19/19 0758 03/19/19 1122  GLUCAP 195* 327* 283* 218* 258*   Lipid Profile: No  results for input(s): CHOL, HDL, LDLCALC, TRIG, CHOLHDL, LDLDIRECT in the last 72 hours. Thyroid Function Tests: No results for input(s): TSH, T4TOTAL, FREET4, T3FREE, THYROIDAB in the last 72 hours. Anemia Panel: Recent Labs    03/18/19 0441  FERRITIN 2,007*   Urine analysis:    Component Value Date/Time   COLORURINE YELLOW 02/25/2019 1830   APPEARANCEUR HAZY (A) 02/25/2019 1830   LABSPEC 1.017 02/25/2019 1830   PHURINE 5.0 02/25/2019 1830   GLUCOSEU NEGATIVE 02/25/2019 1830   HGBUR LARGE (A) 02/25/2019 1830   HGBUR negative 02/04/2008 1027   BILIRUBINUR NEGATIVE 02/25/2019 1830   KETONESUR 5 (A) 02/25/2019 1830   PROTEINUR 100 (A) 02/25/2019 1830   UROBILINOGEN 1.0 08/23/2014 1712   NITRITE NEGATIVE 02/25/2019 1830   LEUKOCYTESUR NEGATIVE 02/25/2019 1830   Sepsis Labs: @LABRCNTIP (procalcitonin:4,lacticidven:4)  ) Recent Results (from the past 240 hour(s))  Blood Culture (routine x 2)     Status: None   Collection Time: 03/23/2019  4:00 PM   Specimen: BLOOD RIGHT ARM  Result Value Ref Range Status   Specimen Description BLOOD RIGHT ARM  Final   Special Requests   Final    BOTTLES DRAWN AEROBIC AND ANAEROBIC Blood Culture adequate volume   Culture   Final    NO GROWTH 5 DAYS Performed at [facility], [place] [address] [place], [place] [address]    Report Status 03/19/2019 FINAL  Final  Blood Culture (routine x 2)     Status: None   Collection Time: 03/03/2019  4:10 PM   Specimen: BLOOD RIGHT HAND  Result Value Ref Range Status   Specimen Description BLOOD RIGHT HAND  Final   Special Requests   Final    BOTTLES DRAWN AEROBIC ONLY Blood Culture results may not be optimal due to an inadequate volume of blood received in culture bottles   Culture   Final    NO GROWTH 5 DAYS Performed at [facility], [place] [address], [place], [place] [address]    Report Status 03/19/2019 FINAL  Final  Respiratory Panel by PCR     Status: None   Collection Time: 03/16/2019 10:06  PM   Specimen: Nasopharyngeal Swab; Respiratory  Result Value Ref Range Status   Adenovirus NOT DETECTED NOT DETECTED Final   Coronavirus 229E NOT DETECTED NOT DETECTED Final    Comment: (NOTE) The Coronavirus on the Respiratory Panel, DOES NOT test for the novel  Coronavirus (2019 nCoV)    Coronavirus HKU1 NOT DETECTED NOT DETECTED Final   Coronavirus NL63 NOT DETECTED NOT DETECTED Final   Coronavirus OC43 NOT DETECTED NOT DETECTED Final   Metapneumovirus NOT DETECTED NOT DETECTED Final   Rhinovirus / Enterovirus NOT DETECTED NOT DETECTED Final   Influenza A NOT DETECTED NOT DETECTED Final   Influenza B NOT DETECTED NOT DETECTED Final   Parainfluenza Virus 1  NOT DETECTED NOT DETECTED Final   Parainfluenza Virus 2 NOT DETECTED NOT DETECTED Final   Parainfluenza Virus 3 NOT DETECTED NOT DETECTED Final   Parainfluenza Virus 4 NOT DETECTED NOT DETECTED Final   Respiratory Syncytial Virus NOT DETECTED NOT DETECTED Final   Bordetella pertussis NOT DETECTED NOT DETECTED Final   Chlamydophila pneumoniae NOT DETECTED NOT DETECTED Final   Mycoplasma pneumoniae NOT DETECTED NOT DETECTED Final    Comment: Performed at [facility], [place] [address] [place], [place] [address]  Surgical pcr screen     Status: None   Collection Time: 03/28/2019 10:06 PM   Specimen: Nasopharyngeal Swab; Nasal Swab  Result Value Ref Range Status   MRSA, PCR NEGATIVE NEGATIVE Final   Staphylococcus aureus NEGATIVE NEGATIVE Final    Comment: (NOTE) The Xpert SA Assay (FDA approved for NASAL specimens in patients 47 years of age and older), is one component of a comprehensive surveillance program. It is not intended to diagnose infection nor to guide or monitor treatment. Performed at [facility], [place] [address], [place], [place] [address]       Studies: No results found.  Scheduled Meds: . Chlorhexidine Gluconate Cloth  6 each Topical Q0600    Continuous Infusions: . morphine 3 mg/hr  (03/20/19 0237)     LOS: 6 days     Annita Brod, MD [facility]  To reach me or the doctor on call, go to: [email or web address] Password [facility]  03/20/2019, 11:56 AM

## 2019-03-29 NOTE — Progress Notes (Signed)
Palliative Medicine RN Note: Spoke with RN Ramona to check on patient's comfort level. Note that his infusion rate was increased to 5mg /hour overnight and that he required a morphine bolus around 1000. She reports that he is comfortable now and that she has updated family.  PMT will follow up again tomorrow to ensure continued comfort.  Marjie Skiff Christena Sunderlin, RN, BSN, [facility] Palliative Medicine Team 04/05/2019 11:55 AM Office [phone]

## 2019-03-29 NOTE — Progress Notes (Signed)
Patient expired. Time of death 69. Completed post mortuum care and checklist. Patient not a candidate for donor services. 2 RNs verified death. Patient's family notified and dr Lyman Speller, notified. Will take patient down to morgue via stretcher once it's brought up. All belongings accounted for and will be sent down with patient. Wasted 34ml of morphine with RN in pyxis.

## 2019-03-29 NOTE — Death Summary Note (Addendum)
Death Summary  [name] [id number] DOB: 05-27-1940 DOA: March 16, 2019  PCP: Reubin Milan, MD  Admit date: March 16, 2019 Date of Death: 03/23/19 Time of Death: 5:55pm Notification: Reubin Milan, MD notified of death of 25-Mar-2019   History of present illness:  80 year old male with past medical history of chronic respiratory failure requiring 3 to 4 L of oxygen from COPD plus hypertension, dementia, paroxysmal A. fib and recent hospitalization from COPD exacerbation plus volume overload admitted on 16-Mar-2023 for severe hypoxia felt to be secondary to Covid.  Despite aggressive attempts with Remdisivir and Solu-Medrol, patient continued to decline.  Palliative Care was consulted.  After speaking with patient's son, he was made comfort care 12/19.  Final Diagnoses:  1.  Principal Problem: Acute on chronic respiratory failure with hypoxia secondary to pneumonia due to COVID-19 virus and patient with history of chronic diastolic heart failure and COPD: Despite treatments, patient declined.  Made comfort care and he passed away comfortably on 03-23-2023. Active Problems:   Atrial fibrillation [facility]): Initially on anticoagulation.  Once he was made comfort care, this was discontinued.  AKI: Creatinine prior to admission was normal (0.84).  On admission, was 1.42.  Did improve closer to normal with IV fluids by the time he was made comfort care.   Diabetes mellitus type II, controlled ([place])   Dementia with behavioral disturbance ([place])   The results of significant diagnostics from this hospitalization (including imaging, microbiology, ancillary and laboratory) are listed below for reference.    Significant Diagnostic Studies: DG Chest 2 View  Result Date: 03/01/2019 CLINICAL DATA:  Shortness of breath. History of COPD. EXAM: CHEST - 2 VIEW COMPARISON:  One-view chest x-ray 02/25/2019. CTA of the chest 08/04/2017 FINDINGS: Heart size is mildly enlarged. Chronic interstitial coarsening is  evident. Persistent right lower lobe airspace disease and effusion is noted. There is increasing patchy airspace opacity more superiorly in the right lung is well. The left lung remains clear part from minimal left basilar atelectasis. Lung volumes are low bilaterally. A healed left posterolateral seventh rib fracture is noted. IMPRESSION: 1. Persistent right lower lobe airspace disease and effusion compatible with pneumonia or aspiration. 2. Increasing patchy airspace disease in the right lung. While this may represent atelectasis, infection is also considered. Electronically Signed   By: San Morelle M.D.   On: 03/01/2019 09:12   CT HEAD WO CONTRAST  Result Date: 02/25/2019 CLINICAL DATA:  Syncope. EXAM: CT HEAD WITHOUT CONTRAST TECHNIQUE: Contiguous axial images were obtained from the base of the skull through the vertex without intravenous contrast. COMPARISON:  March 15, 2018. FINDINGS: Brain: Mild diffuse cortical atrophy is noted. Mild chronic ischemic white matter disease is noted. No mass effect or midline shift is noted. Ventricular size is within normal limits. There is no evidence of mass lesion, hemorrhage or acute infarction. Vascular: No hyperdense vessel or unexpected calcification. Skull: Normal. Negative for fracture or focal lesion. Sinuses/Orbits: No acute finding. Other: None. IMPRESSION: Mild diffuse cortical atrophy. Mild chronic ischemic white matter disease. No acute intracranial abnormality seen. Electronically Signed   By: Marijo Conception M.D.   On: 02/25/2019 14:25   DG Chest Port 1 View  Result Date: 03/16/19 CLINICAL DATA:  Shortness of breath. Additional provided: Shortness of breath, patient denies fever or cough. History of dementia. EXAM: PORTABLE CHEST 1 VIEW COMPARISON:  Chest radiograph 07/16/2018 FINDINGS: Mild cardiomegaly, unchanged. Sequela of prior median sternotomy/CABG. Airspace opacity at the right lung base has increased since prior examination  03/01/2019. New  subtle pulmonary opacities also questioned within the left midlung. No sizable pleural effusion or evidence of pneumothorax. No acute bony abnormality. IMPRESSION: Interval increase in right basilar airspace opacity since prior exam 03/01/2019. New subtle pulmonary opacities also questioned within the left mid lung. Findings are nonspecific but most suspicious for pneumonia. Clinical correlation and radiographic follow-up to resolution recommended. Cardiomegaly status post CABG. Electronically Signed   By: Kellie Simmering DO   On: 03/26/2019 16:05   DG Chest Portable 1 View  Result Date: 02/25/2019 CLINICAL DATA:  COPD, dyspnea, fall this morning EXAM: PORTABLE CHEST 1 VIEW COMPARISON:  05/11/2018 chest radiograph. FINDINGS: Intact sternotomy wires. CABG clips overlie the mediastinum. Surgical clips in the medial left thoracic inlet. Stable cardiomediastinal silhouette with normal heart size. No pneumothorax. Right costophrenic angle opacity. No left pleural effusion. Hazy right lung base opacity. IMPRESSION: 1. Hazy right lung base opacity, cannot exclude pneumonia. Chest radiograph follow-up advised. 2. Right costophrenic angle opacity suspicious for small right pleural effusion. Electronically Signed   By: Ilona Sorrel M.D.   On: 02/25/2019 10:14   ECHOCARDIOGRAM COMPLETE  Result Date: 03/02/2019   ECHOCARDIOGRAM REPORT   Patient Name:   [name] Date of Exam: 03/02/2019 Medical Rec #:  [id number]   Height:       65.0 in Accession #:    [id number]  Weight:       134.0 lb Date of Birth:  03-04-1941   BSA:          1.67 m Patient Age:    88 years    BP:           139/87 mmHg Patient Gender: M           HR:           82 bpm. Exam Location:  Inpatient Procedure: 2D Echo Indications:    425.9 cardiomyopathy  History:        Patient has prior history of Echocardiogram examinations, most                 recent 08/06/2017. CHF, Previous Myocardial Infarction and CAD,                 Prior CABG,  COPD; Risk Factors:Hypertension, Dyslipidemia,                 Diabetes and Former Smoker. ETOH abuse.  Sonographer:    Jannett Celestine RDCS (AE) Referring Phys: [phone] ANKIT CHIRAG AMIN IMPRESSIONS  1. Left ventricular ejection fraction, by visual estimation, is 60 to 65%. The left ventricle has normal function. There is mildly increased left ventricular hypertrophy.  2. Left ventricular diastolic function could not be evaluated.  3. Global right ventricle has mildly reduced systolic function.The right ventricular size is mildly enlarged. No increase in right ventricular wall thickness.  4. Left atrial size was moderately dilated.  5. Right atrial size was moderately dilated.  6. Moderate mitral annular calcification.  7. The mitral valve is normal in structure. No evidence of mitral valve regurgitation. No evidence of mitral stenosis.  8. The tricuspid valve is normal in structure. Tricuspid valve regurgitation mild-moderate.  9. The aortic valve is normal in structure. Aortic valve regurgitation is not visualized. No evidence of aortic valve sclerosis or stenosis. 10. The pulmonic valve was normal in structure. Pulmonic valve regurgitation is not visualized. 11. Moderately elevated pulmonary artery systolic pressure. 12. The tricuspid regurgitant velocity is 3.00 m/s, and with an assumed right atrial pressure of 8  mmHg, the estimated right ventricular systolic pressure is moderately elevated at 44.0 mmHg. 13. The inferior vena cava is normal in size with greater than 50% respiratory variability, suggesting right atrial pressure of 3 mmHg. FINDINGS  Left Ventricle: Left ventricular ejection fraction, by visual estimation, is 60 to 65%. The left ventricle has normal function. There is mildly increased left ventricular hypertrophy. Concentric left ventricular hypertrophy. The left ventricular diastology could not be evaluated due to atrial fibrillation. Left ventricular diastolic function could not be evaluated.  Normal left atrial pressure. Right Ventricle: The right ventricular size is mildly enlarged. No increase in right ventricular wall thickness. Global RV systolic function is has mildly reduced systolic function. The tricuspid regurgitant velocity is 3.00 m/s, and with an assumed right atrial pressure of 8 mmHg, the estimated right ventricular systolic pressure is moderately elevated at 44.0 mmHg. Left Atrium: Left atrial size was moderately dilated. Right Atrium: Right atrial size was moderately dilated Pericardium: There is no evidence of pericardial effusion. Mitral Valve: The mitral valve is normal in structure. Moderate mitral annular calcification. No evidence of mitral valve stenosis by observation. No evidence of mitral valve regurgitation. Tricuspid Valve: The tricuspid valve is normal in structure. Tricuspid valve regurgitation mild-moderate. Aortic Valve: The aortic valve is normal in structure. Aortic valve regurgitation is not visualized. The aortic valve is structurally normal, with no evidence of sclerosis or stenosis. Pulmonic Valve: The pulmonic valve was normal in structure. Pulmonic valve regurgitation is not visualized. Aorta: The aortic root, ascending aorta and aortic arch are all structurally normal, with no evidence of dilitation or obstruction. Venous: The inferior vena cava is normal in size with greater than 50% respiratory variability, suggesting right atrial pressure of 3 mmHg. IAS/Shunts: No atrial level shunt detected by color flow Doppler. There is no evidence of a patent foramen ovale. No ventricular septal defect is seen or detected. There is no evidence of an atrial septal defect.  LEFT VENTRICLE PLAX 2D LVIDd:         4.40 cm LVIDs:         2.80 cm LV PW:         1.30 cm LV IVS:        1.10 cm LVOT diam:     1.90 cm LV SV:         58 ml LV SV Index:   34.76 LVOT Area:     2.84 cm  RIGHT VENTRICLE RV S prime:     14.70 cm/s TAPSE (M-mode): 1.6 cm LEFT ATRIUM             Index        RIGHT ATRIUM           Index LA diam:        5.20 cm 3.12 cm/m  RA Area:     23.30 cm LA Vol (A2C):   29.4 ml 17.62 ml/m RA Volume:   67.60 ml  40.51 ml/m LA Vol (A4C):   25.3 ml 15.16 ml/m LA Biplane Vol: 29.3 ml 17.56 ml/m  AORTIC VALVE LVOT Vmax:   95.70 cm/s LVOT Vmean:  74.500 cm/s LVOT VTI:    0.204 m  AORTA Ao Root diam: 3.00 cm MITRAL VALVE                        TRICUSPID VALVE MV Area (PHT): 4.31 cm             TR Peak grad:  36.0 mmHg MV PHT:        51.04 msec           TR Vmax:        300.00 cm/s MV Decel Time: 176 msec MV E velocity: 144.00 cm/s 103 cm/s SHUNTS                                     Systemic VTI:  0.20 m                                     Systemic Diam: 1.90 cm  Ena Dawley MD Electronically signed by Ena Dawley MD Signature Date/Time: 03/02/2019/2:11:40 PM    Final     Microbiology: Recent Results (from the past 240 hour(s))  Blood Culture (routine x 2)     Status: None   Collection Time: 03/15/2019  4:00 PM   Specimen: BLOOD RIGHT ARM  Result Value Ref Range Status   Specimen Description BLOOD RIGHT ARM  Final   Special Requests   Final    BOTTLES DRAWN AEROBIC AND ANAEROBIC Blood Culture adequate volume   Culture   Final    NO GROWTH 5 DAYS Performed at [facility], [address], [place], [place] [address]    Report Status 03/19/2019 FINAL  Final  Blood Culture (routine x 2)     Status: None   Collection Time: 03/09/2019  4:10 PM   Specimen: BLOOD RIGHT HAND  Result Value Ref Range Status   Specimen Description BLOOD RIGHT HAND  Final   Special Requests   Final    BOTTLES DRAWN AEROBIC ONLY Blood Culture results may not be optimal due to an inadequate volume of blood received in culture bottles   Culture   Final    NO GROWTH 5 DAYS Performed at [facility], [place] [address] [place], [place] [address]    Report Status 03/19/2019 FINAL  Final  Respiratory Panel by PCR     Status: None   Collection Time: 03/26/2019 10:06 PM    Specimen: Nasopharyngeal Swab; Respiratory  Result Value Ref Range Status   Adenovirus NOT DETECTED NOT DETECTED Final   Coronavirus 229E NOT DETECTED NOT DETECTED Final    Comment: (NOTE) The Coronavirus on the Respiratory Panel, DOES NOT test for the novel  Coronavirus (2019 nCoV)    Coronavirus HKU1 NOT DETECTED NOT DETECTED Final   Coronavirus NL63 NOT DETECTED NOT DETECTED Final   Coronavirus OC43 NOT DETECTED NOT DETECTED Final   Metapneumovirus NOT DETECTED NOT DETECTED Final   Rhinovirus / Enterovirus NOT DETECTED NOT DETECTED Final   Influenza A NOT DETECTED NOT DETECTED Final   Influenza B NOT DETECTED NOT DETECTED Final   Parainfluenza Virus 1 NOT DETECTED NOT DETECTED Final   Parainfluenza Virus 2 NOT DETECTED NOT DETECTED Final   Parainfluenza Virus 3 NOT DETECTED NOT DETECTED Final   Parainfluenza Virus 4 NOT DETECTED NOT DETECTED Final   Respiratory Syncytial Virus NOT DETECTED NOT DETECTED Final   Bordetella pertussis NOT DETECTED NOT DETECTED Final   Chlamydophila pneumoniae NOT DETECTED NOT DETECTED Final   Mycoplasma pneumoniae NOT DETECTED NOT DETECTED Final    Comment: Performed at [facility], [place] [address], [place],  [address]  Surgical pcr screen     Status: None  Collection Time: 02/27/2019 10:06 PM   Specimen: Nasopharyngeal Swab; Nasal Swab  Result Value Ref Range Status   MRSA, PCR NEGATIVE NEGATIVE Final   Staphylococcus aureus NEGATIVE NEGATIVE Final    Comment: (NOTE) The Xpert SA Assay (FDA approved for NASAL specimens in patients 63 years of age and older), is one component of a comprehensive surveillance program. It is not intended to diagnose infection nor to guide or monitor treatment. Performed at [facility], [place] [address], [place], [place] [address]      Labs: Basic Metabolic Panel: Recent Labs  Lab 03/17/19 0419 03/18/19 0441  NA 142 149*  K 3.9 4.0  CL 103 111  CO2 26 26  GLUCOSE 312* 224*  BUN 40* 41*    CREATININE 1.14 1.01  CALCIUM 8.1* 8.2*  MG 1.7 1.7   Liver Function Tests: Recent Labs  Lab 03/17/19 0419 03/18/19 0441  AST 27 23  ALT 26 24  ALKPHOS 54 72  BILITOT 0.9 1.8*  PROT 5.3* 5.2*  ALBUMIN 2.6* 2.6*   No results for input(s): LIPASE, AMYLASE in the last 168 hours. No results for input(s): AMMONIA in the last 168 hours. CBC: Recent Labs  Lab 03/17/19 0419 03/18/19 0441  WBC 9.8 11.2*  NEUTROABS 9.3* 10.6*  HGB 13.2 13.9  HCT 40.3 42.5  MCV 101.3* 100.7*  PLT 64* 60*   Cardiac Enzymes: No results for input(s): CKTOTAL, CKMB, CKMBINDEX, TROPONINI in the last 168 hours. D-Dimer No results for input(s): DDIMER in the last 72 hours. BNP: Invalid input(s): POCBNP CBG: Recent Labs  Lab 03/18/19 1134 03/18/19 1605 03/18/19 2252 03/19/19 0758 03/19/19 1122  GLUCAP 195* 327* 283* 218* 258*   Anemia work up No results for input(s): VITAMINB12, FOLATE, FERRITIN, TIBC, IRON, RETICCTPCT in the last 72 hours. Urinalysis    Component Value Date/Time   COLORURINE YELLOW 02/25/2019 1830   APPEARANCEUR HAZY (A) 02/25/2019 1830   LABSPEC 1.017 02/25/2019 1830   PHURINE 5.0 02/25/2019 1830   GLUCOSEU NEGATIVE 02/25/2019 1830   HGBUR LARGE (A) 02/25/2019 1830   HGBUR negative 02/04/2008 [place] 02/25/2019 1830   KETONESUR 5 (A) 02/25/2019 1830   PROTEINUR 100 (A) 02/25/2019 1830   UROBILINOGEN 1.0 08/23/2014 1712   NITRITE NEGATIVE 02/25/2019 1830   LEUKOCYTESUR NEGATIVE 02/25/2019 1830   Sepsis Labs Invalid input(s): PROCALCITONIN,  WBC,  LACTICIDVEN     SIGNED:  Annita Brod, MD  [facility] 03/23/2019, 8:11 AM Pager   If 7PM-7AM, please contact night-coverage [email or web address] Password [email or web address]

## 2019-03-29 NOTE — Progress Notes (Signed)
PROGRESS NOTE  [name] [id number] DOB: Apr 17, 1940 DOA: 02/26/2019 PCP: Reubin Milan, MD  HPI/Recap of past 87 hours: 79 year old male with past medical history of chronic respiratory failure requiring 3 to 4 L of oxygen from COPD plus hypertension, dementia, paroxysmal A. fib and recent hospitalization from COPD exacerbation plus volume overload admitted on 12/17 for severe hypoxia felt to be secondary to Covid.  Despite aggressive attempts with Remdisivir and Solu-Medrol, patient continued to decline.  After speaking with patient's son, he was made comfort care 12/19.  No changes since.  Patient's respiratory status continues to slowly decline although he appears comfortable. Currently breathing around at 12x/min  Assessment/Plan: Principal Problem: Acute on chronic respiratory failure with hypoxia secondary to pneumonia due to COVID-19 virus and patient with history of chronic diastolic heart failure and COPD: Patient initially treated with Remdisivir and steroids.  Now changed to comfort care. Active Problems:   Atrial fibrillation [facility]): Initially on anticoagulation, now comfort care   Diabetes mellitus type II, controlled ([place])   Dementia with behavioral disturbance [facility]): Currently comfortable.  Code Status: DNR, comfort care  Family Communication: Son updated by phone  Disposition Plan: Expect patient will likely pass in the next 1 to 2 days   Consultants:  Palliative care  Procedures:  None  Antimicrobials:  IV cefepime and vancomycin 12/17-12/19  DVT prophylaxis: Currently comfort care   Objective: Vitals:   04-18-2019 0747 2019-04-18 0959  BP: (!) 85/63   Pulse: (!) 134 (!) 136  Resp: 12 12  Temp: 99.5 F (37.5 C)   SpO2: 92% 94%    Intake/Output Summary (Last 24 hours) at 04/18/2019 1124 Last data filed at 2019/04/18 0747 Gross per 24 hour  Intake 21.98 ml  Output 525 ml  Net -503.02 ml   Filed Weights   03/16/19 0500 03/17/19 0426  03/18/19 0332  Weight: 60 kg 60 kg 54.5 kg   Body mass index is 20.01 kg/m.  Exam:   General: Appears comfortable, somnolent, no aggressive respirations  Cardiovascular: Tachycardic, irregular  Respiratory: Scattered rails, limited inspiratory effort   Data Reviewed: CBC: Recent Labs  Lab 03/02/2019 1620 03/13/2019 1816 03/15/19 0353 03/16/19 0344 03/17/19 0419 03/18/19 0441  WBC 6.6  --  3.2* 11.9* 9.8 11.2*  NEUTROABS 6.1  --   --  11.3* 9.3* 10.6*  HGB 14.8 13.6 14.0 14.1 13.2 13.9  HCT 45.2 40.0 43.4 42.2 40.3 42.5  MCV 101.6*  --  100.9* 99.5 101.3* 100.7*  PLT 90*  --  87* 82* 64* 60*   Basic Metabolic Panel: Recent Labs  Lab 03/19/2019 1620 02/26/2019 1816 03/15/19 0353 03/16/19 0344 03/17/19 0419 03/18/19 0441  NA 140 138 140 142 142 149*  K 4.5 4.5 4.2 4.3 3.9 4.0  CL 101  --  103 105 103 111  CO2 25  --  23 23 26 26   GLUCOSE 150*  --  259* 252* 312* 224*  BUN 20  --  30* 34* 40* 41*  CREATININE 1.42*  --  1.42* 1.19 1.14 1.01  CALCIUM 8.4*  --  8.3* 8.4* 8.1* 8.2*  MG  --   --   --  1.7 1.7 1.7   GFR: Estimated Creatinine Clearance: 46.5 mL/min (by C-G formula based on SCr of 1.01 mg/dL). Liver Function Tests: Recent Labs  Lab 03/28/2019 1620 03/15/19 0353 03/16/19 0344 03/17/19 0419 03/18/19 0441  AST 30 29 29 27 23   ALT 24 25 24 26 24   ALKPHOS 43 42 49  54 72  BILITOT 1.2 1.0 0.9 0.9 1.8*  PROT 6.2* 5.6* 5.7* 5.3* 5.2*  ALBUMIN 2.7* 2.5* 2.6* 2.6* 2.6*   No results for input(s): LIPASE, AMYLASE in the last 168 hours. No results for input(s): AMMONIA in the last 168 hours. Coagulation Profile: No results for input(s): INR, PROTIME in the last 168 hours. Cardiac Enzymes: No results for input(s): CKTOTAL, CKMB, CKMBINDEX, TROPONINI in the last 168 hours. BNP (last 3 results) No results for input(s): PROBNP in the last 8760 hours. HbA1C: No results for input(s): HGBA1C in the last 72 hours. CBG: Recent Labs  Lab 03/18/19 1134  03/18/19 1605 03/18/19 2252 03/19/19 0758 03/19/19 1122  GLUCAP 195* 327* 283* 218* 258*   Lipid Profile: No results for input(s): CHOL, HDL, LDLCALC, TRIG, CHOLHDL, LDLDIRECT in the last 72 hours. Thyroid Function Tests: No results for input(s): TSH, T4TOTAL, FREET4, T3FREE, THYROIDAB in the last 72 hours. Anemia Panel: No results for input(s): VITAMINB12, FOLATE, FERRITIN, TIBC, IRON, RETICCTPCT in the last 72 hours. Urine analysis:    Component Value Date/Time   COLORURINE YELLOW 02/25/2019 1830   APPEARANCEUR HAZY (A) 02/25/2019 1830   LABSPEC 1.017 02/25/2019 1830   PHURINE 5.0 02/25/2019 1830   GLUCOSEU NEGATIVE 02/25/2019 1830   HGBUR LARGE (A) 02/25/2019 1830   HGBUR negative 02/04/2008 1027   BILIRUBINUR NEGATIVE 02/25/2019 1830   KETONESUR 5 (A) 02/25/2019 1830   PROTEINUR 100 (A) 02/25/2019 1830   UROBILINOGEN 1.0 08/23/2014 1712   NITRITE NEGATIVE 02/25/2019 1830   LEUKOCYTESUR NEGATIVE 02/25/2019 1830   Sepsis Labs: @LABRCNTIP (procalcitonin:4,lacticidven:4)  ) Recent Results (from the past 240 hour(s))  Blood Culture (routine x 2)     Status: None   Collection Time: 03/13/2019  4:00 PM   Specimen: BLOOD RIGHT ARM  Result Value Ref Range Status   Specimen Description BLOOD RIGHT ARM  Final   Special Requests   Final    BOTTLES DRAWN AEROBIC AND ANAEROBIC Blood Culture adequate volume   Culture   Final    NO GROWTH 5 DAYS Performed at [facility], [place] [address], [place], [place] [address]    Report Status 03/19/2019 FINAL  Final  Blood Culture (routine x 2)     Status: None   Collection Time: 03/02/2019  4:10 PM   Specimen: BLOOD RIGHT HAND  Result Value Ref Range Status   Specimen Description BLOOD RIGHT HAND  Final   Special Requests   Final    BOTTLES DRAWN AEROBIC ONLY Blood Culture results may not be optimal due to an inadequate volume of blood received in culture bottles   Culture   Final    NO GROWTH 5 DAYS Performed at [facility], [place] [address], [place],  [address]    Report Status 03/19/2019 FINAL  Final  Respiratory Panel by PCR     Status: None   Collection Time: 02/28/2019 10:06 PM   Specimen: Nasopharyngeal Swab; Respiratory  Result Value Ref Range Status   Adenovirus NOT DETECTED NOT DETECTED Final   Coronavirus 229E NOT DETECTED NOT DETECTED Final    Comment: (NOTE) The Coronavirus on the Respiratory Panel, DOES NOT test for the novel  Coronavirus (2019 nCoV)    Coronavirus HKU1 NOT DETECTED NOT DETECTED Final   Coronavirus NL63 NOT DETECTED NOT DETECTED Final   Coronavirus OC43 NOT DETECTED NOT DETECTED Final   Metapneumovirus NOT DETECTED NOT DETECTED Final   Rhinovirus / Enterovirus NOT DETECTED NOT DETECTED Final   Influenza  A NOT DETECTED NOT DETECTED Final   Influenza B NOT DETECTED NOT DETECTED Final   Parainfluenza Virus 1 NOT DETECTED NOT DETECTED Final   Parainfluenza Virus 2 NOT DETECTED NOT DETECTED Final   Parainfluenza Virus 3 NOT DETECTED NOT DETECTED Final   Parainfluenza Virus 4 NOT DETECTED NOT DETECTED Final   Respiratory Syncytial Virus NOT DETECTED NOT DETECTED Final   Bordetella pertussis NOT DETECTED NOT DETECTED Final   Chlamydophila pneumoniae NOT DETECTED NOT DETECTED Final   Mycoplasma pneumoniae NOT DETECTED NOT DETECTED Final    Comment: Performed at [facility], [place] [address], [place], [place] [address]  Surgical pcr screen     Status: None   Collection Time: 02/26/2019 10:06 PM   Specimen: Nasopharyngeal Swab; Nasal Swab  Result Value Ref Range Status   MRSA, PCR NEGATIVE NEGATIVE Final   Staphylococcus aureus NEGATIVE NEGATIVE Final    Comment: (NOTE) The Xpert SA Assay (FDA approved for NASAL specimens in patients 18 years of age and older), is one component of a comprehensive surveillance program. It is not intended to diagnose infection nor to guide or monitor treatment. Performed at [facility], [place] [address], [place], [place] [address]        Studies: No results found.  Scheduled Meds: . Chlorhexidine Gluconate Cloth  6 each Topical Q0600    Continuous Infusions: . morphine 5 mg/hr (Apr 09, 2019 0418)     LOS: 7 days     Annita Brod, MD [facility]  To reach me or the doctor on call, go to: [email or web address] Password [email or web address]  2019/04/09, 11:24 AM

## 2019-03-29 NOTE — Plan of Care (Signed)
RN updated pt's son, Jaishawn Buffone. via phone. RN answered all of son's questions pertaining to oxygenation and pain management. Pt currently on 3L [place] (baseline) satting 90% with morphine gtt infusing at 5mg /hr. Will continue to monitor pt and update son with any changes.  Problem: Elimination: Goal: Will not experience complications related to urinary retention Outcome: Progressing   Problem: Pain Managment: Goal: General experience of comfort will improve Outcome: Progressing   Problem: Safety: Goal: Ability to remain free from injury will improve Outcome: Progressing   Problem: Skin Integrity: Goal: Risk for impaired skin integrity will decrease Outcome: Progressing   Problem: Coping: Goal: Psychosocial and spiritual needs will be supported Outcome: Progressing   Problem: Respiratory: Goal: Will maintain a patent airway Outcome: Progressing   Problem: Education: Goal: Knowledge of General Education information will improve Description: Including pain rating scale, medication(s)/side effects and non-pharmacologic comfort measures Outcome: Not Progressing   Problem: Health Behavior/Discharge Planning: Goal: Ability to manage health-related needs will improve Outcome: Not Progressing   Problem: Clinical Measurements: Goal: Ability to maintain clinical measurements within normal limits will improve Outcome: Not Progressing Goal: Will remain free from infection Outcome: Not Progressing Goal: Diagnostic test results will improve Outcome: Not Progressing Goal: Respiratory complications will improve Outcome: Not Progressing Goal: Cardiovascular complication will be avoided Outcome: Not Progressing   Problem: Activity: Goal: Risk for activity intolerance will decrease Outcome: Not Progressing   Problem: Nutrition: Goal: Adequate nutrition will be maintained Outcome: Not Progressing   Problem: Coping: Goal: Level of anxiety will decrease Outcome: Not Progressing    Problem: Elimination: Goal: Will not experience complications related to bowel motility Outcome: Not Progressing   Problem: Education: Goal: Knowledge of risk factors and measures for prevention of condition will improve Outcome: Not Progressing   Problem: Respiratory: Goal: Complications related to the disease process, condition or treatment will be avoided or minimized Outcome: Not Progressing

## 2019-03-29 DEATH — deceased

## 2020-07-29 IMAGING — DX DG CHEST 1V PORT
1 series · 1 of 1 positions shown · non-contrast
Comparison: Chest radiograph 07/16/2018

CLINICAL DATA: Shortness of breath. Additional provided: Shortness
of breath, patient denies fever or cough. History of dementia.

EXAM:
PORTABLE CHEST 1 VIEW

[chest]
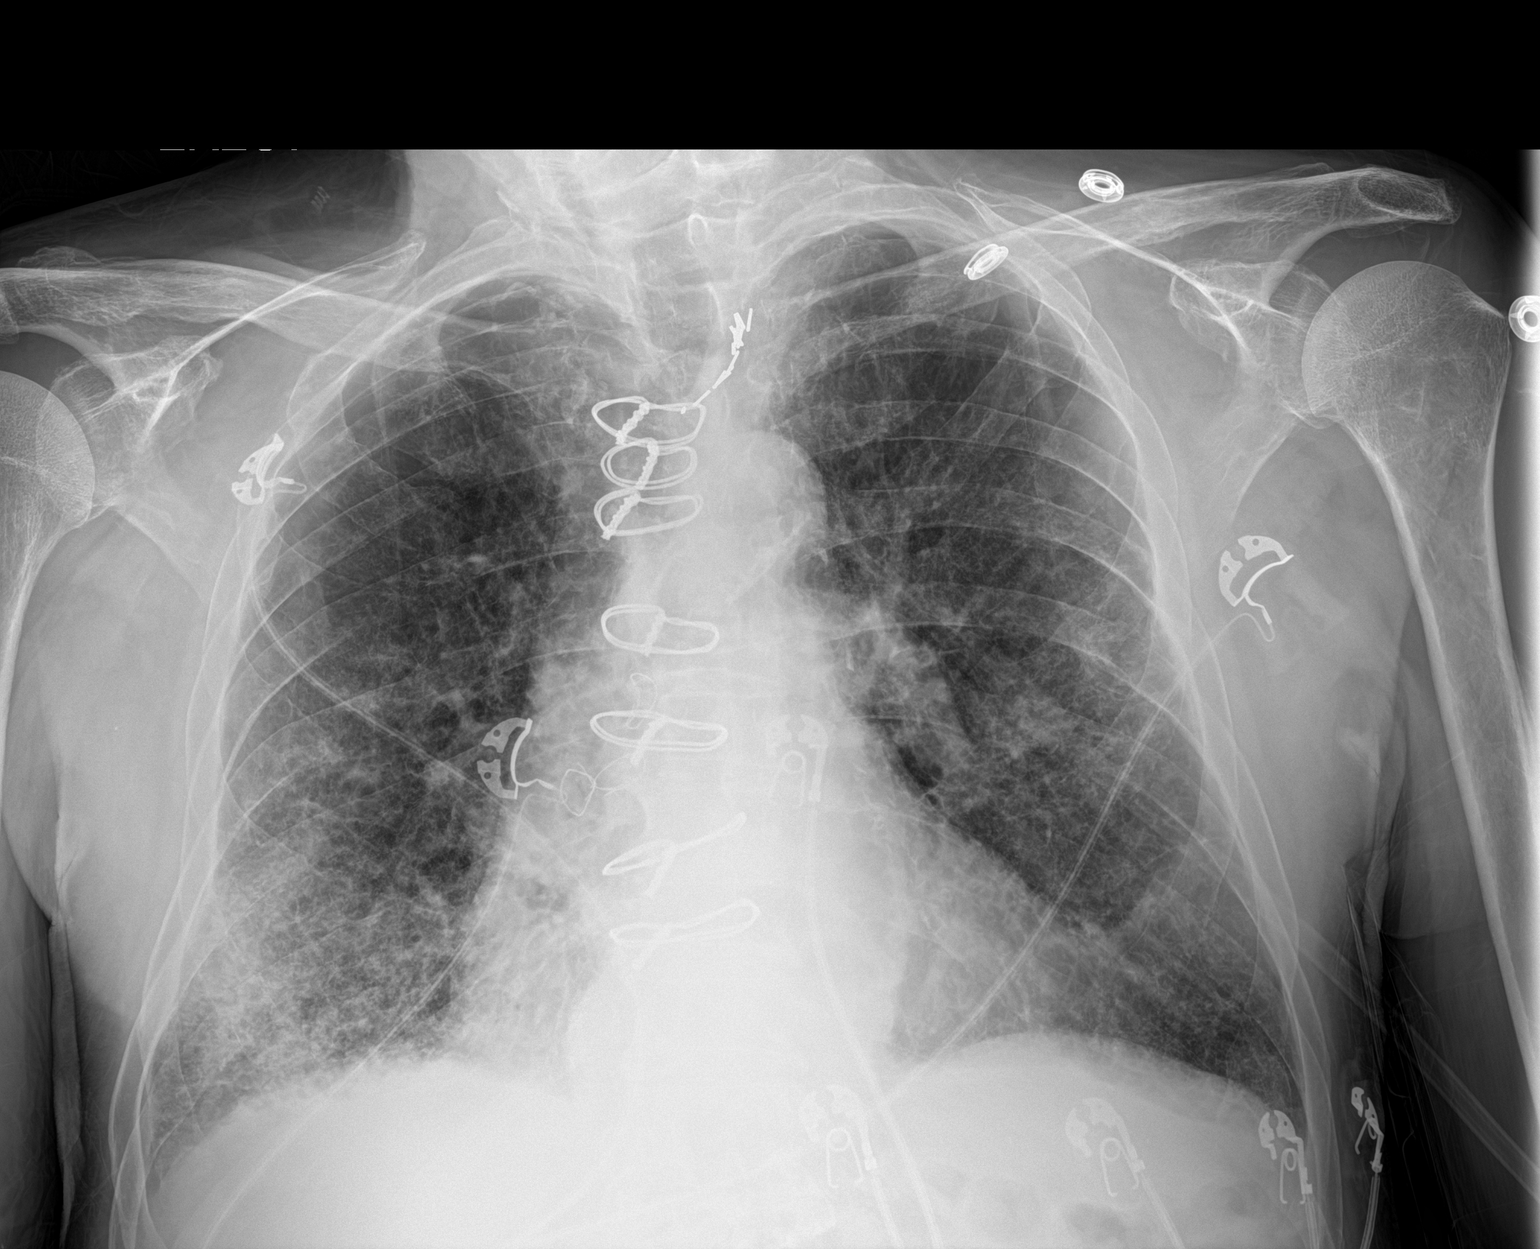

[1 of 1 positions shown; findings below may reference images not displayed]

FINDINGS: Mild cardiomegaly, unchanged. Sequela of prior median
sternotomy/CABG.

Airspace opacity at the right lung base has increased since prior
examination 03/01/2019. New subtle pulmonary opacities also
questioned within the left midlung. No sizable pleural effusion or
evidence of pneumothorax. No acute bony abnormality.
IMPRESSION: Interval increase in right basilar airspace opacity since prior exam
03/01/2019. New subtle pulmonary opacities also questioned within
the left mid lung. Findings are nonspecific but

most suspicious for pneumonia. Clinical correlation and radiographic
follow-up to resolution recommended.

Cardiomegaly status post CABG.
